# Patient Record
Sex: Female | Born: 1950 | Race: White | Hispanic: No | Smoking: Former smoker
Health system: Southern US, Community
[De-identification: ages and names within clinical notes are randomized; demographics above are authoritative.]

## PROBLEM LIST (undated history)

## (undated) DIAGNOSIS — R32 Unspecified urinary incontinence: Secondary | ICD-10-CM

## (undated) DIAGNOSIS — Z9889 Other specified postprocedural states: Secondary | ICD-10-CM

## (undated) DIAGNOSIS — M797 Fibromyalgia: Secondary | ICD-10-CM

## (undated) DIAGNOSIS — R112 Nausea with vomiting, unspecified: Secondary | ICD-10-CM

## (undated) DIAGNOSIS — B999 Unspecified infectious disease: Secondary | ICD-10-CM

## (undated) DIAGNOSIS — J45909 Unspecified asthma, uncomplicated: Secondary | ICD-10-CM

## (undated) DIAGNOSIS — I2510 Atherosclerotic heart disease of native coronary artery without angina pectoris: Secondary | ICD-10-CM

## (undated) DIAGNOSIS — M1990 Unspecified osteoarthritis, unspecified site: Secondary | ICD-10-CM

## (undated) DIAGNOSIS — R350 Frequency of micturition: Secondary | ICD-10-CM

## (undated) DIAGNOSIS — E119 Type 2 diabetes mellitus without complications: Secondary | ICD-10-CM

## (undated) DIAGNOSIS — I10 Essential (primary) hypertension: Secondary | ICD-10-CM

## (undated) DIAGNOSIS — K219 Gastro-esophageal reflux disease without esophagitis: Secondary | ICD-10-CM

## (undated) DIAGNOSIS — R0600 Dyspnea, unspecified: Secondary | ICD-10-CM

## (undated) DIAGNOSIS — E7800 Pure hypercholesterolemia, unspecified: Secondary | ICD-10-CM

## (undated) DIAGNOSIS — I739 Peripheral vascular disease, unspecified: Secondary | ICD-10-CM

## (undated) HISTORY — PX: KNEE SURGERY: SHX244

## (undated) HISTORY — PX: MULTIPLE TOOTH EXTRACTIONS: SHX2053

## (undated) HISTORY — PX: ABDOMINAL HYSTERECTOMY: SHX81

## (undated) HISTORY — PX: COLONOSCOPY: SHX174

## (undated) HISTORY — PX: CORONARY ARTERY BYPASS GRAFT: SHX141

---

## 1998-05-13 ENCOUNTER — Encounter: Payer: Self-pay | Admitting: Emergency Medicine

## 1998-05-13 ENCOUNTER — Inpatient Hospital Stay (HOSPITAL_COMMUNITY): Admission: EM | Admit: 1998-05-13 | Discharge: 1998-05-16 | Payer: Self-pay | Admitting: Emergency Medicine

## 1998-05-15 ENCOUNTER — Encounter: Payer: Self-pay | Admitting: Emergency Medicine

## 1998-08-19 ENCOUNTER — Other Ambulatory Visit: Admission: RE | Admit: 1998-08-19 | Discharge: 1998-08-19 | Payer: Self-pay | Admitting: Gynecology

## 1998-11-27 ENCOUNTER — Encounter: Admission: RE | Admit: 1998-11-27 | Discharge: 1999-02-25 | Payer: Self-pay | Admitting: Family Medicine

## 2001-10-27 ENCOUNTER — Emergency Department (HOSPITAL_COMMUNITY): Admission: EM | Admit: 2001-10-27 | Discharge: 2001-10-28 | Payer: Self-pay | Admitting: Emergency Medicine

## 2001-11-14 ENCOUNTER — Encounter: Payer: Self-pay | Admitting: Orthopedic Surgery

## 2001-11-21 ENCOUNTER — Encounter: Payer: Self-pay | Admitting: Orthopedic Surgery

## 2001-11-22 ENCOUNTER — Inpatient Hospital Stay (HOSPITAL_COMMUNITY): Admission: RE | Admit: 2001-11-22 | Discharge: 2001-11-25 | Payer: Self-pay | Admitting: Orthopedic Surgery

## 2001-11-22 ENCOUNTER — Encounter: Payer: Self-pay | Admitting: Orthopedic Surgery

## 2003-01-19 ENCOUNTER — Ambulatory Visit (HOSPITAL_COMMUNITY): Admission: RE | Admit: 2003-01-19 | Discharge: 2003-01-19 | Payer: Self-pay | Admitting: Internal Medicine

## 2003-06-26 ENCOUNTER — Ambulatory Visit (HOSPITAL_COMMUNITY): Admission: RE | Admit: 2003-06-26 | Discharge: 2003-06-26 | Payer: Self-pay | Admitting: Internal Medicine

## 2003-07-26 ENCOUNTER — Ambulatory Visit (HOSPITAL_COMMUNITY): Admission: RE | Admit: 2003-07-26 | Discharge: 2003-07-26 | Payer: Self-pay | Admitting: Family Medicine

## 2003-08-31 ENCOUNTER — Emergency Department (HOSPITAL_COMMUNITY): Admission: EM | Admit: 2003-08-31 | Discharge: 2003-09-01 | Payer: Self-pay | Admitting: Emergency Medicine

## 2003-10-15 ENCOUNTER — Ambulatory Visit: Payer: Self-pay | Admitting: Nurse Practitioner

## 2003-10-15 ENCOUNTER — Ambulatory Visit: Payer: Self-pay | Admitting: *Deleted

## 2003-10-18 ENCOUNTER — Ambulatory Visit: Payer: Self-pay | Admitting: Nurse Practitioner

## 2003-11-20 ENCOUNTER — Ambulatory Visit: Payer: Self-pay | Admitting: Nurse Practitioner

## 2004-01-15 ENCOUNTER — Ambulatory Visit: Payer: Self-pay | Admitting: Nurse Practitioner

## 2004-01-18 ENCOUNTER — Ambulatory Visit: Payer: Self-pay | Admitting: Nurse Practitioner

## 2004-03-13 ENCOUNTER — Ambulatory Visit: Payer: Self-pay | Admitting: Nurse Practitioner

## 2004-05-16 ENCOUNTER — Ambulatory Visit: Payer: Self-pay | Admitting: Family Medicine

## 2004-05-16 ENCOUNTER — Ambulatory Visit (HOSPITAL_COMMUNITY): Admission: RE | Admit: 2004-05-16 | Discharge: 2004-05-16 | Payer: Self-pay | Admitting: Family Medicine

## 2004-05-29 ENCOUNTER — Ambulatory Visit: Payer: Self-pay | Admitting: Nurse Practitioner

## 2004-05-29 ENCOUNTER — Inpatient Hospital Stay (HOSPITAL_COMMUNITY): Admission: EM | Admit: 2004-05-29 | Discharge: 2004-06-05 | Payer: Self-pay | Admitting: Emergency Medicine

## 2004-05-30 ENCOUNTER — Ambulatory Visit: Payer: Self-pay | Admitting: Cardiology

## 2004-06-26 ENCOUNTER — Ambulatory Visit: Payer: Self-pay | Admitting: Internal Medicine

## 2004-09-12 ENCOUNTER — Ambulatory Visit: Payer: Self-pay | Admitting: Cardiology

## 2004-10-03 ENCOUNTER — Ambulatory Visit: Payer: Self-pay | Admitting: *Deleted

## 2004-11-04 ENCOUNTER — Ambulatory Visit: Payer: Self-pay | Admitting: Nurse Practitioner

## 2005-01-13 ENCOUNTER — Ambulatory Visit: Payer: Self-pay | Admitting: Nurse Practitioner

## 2005-05-22 ENCOUNTER — Ambulatory Visit: Payer: Self-pay | Admitting: Internal Medicine

## 2005-06-25 ENCOUNTER — Ambulatory Visit: Payer: Self-pay | Admitting: *Deleted

## 2005-09-16 ENCOUNTER — Ambulatory Visit: Payer: Self-pay | Admitting: Nurse Practitioner

## 2005-09-18 ENCOUNTER — Ambulatory Visit (HOSPITAL_COMMUNITY): Admission: RE | Admit: 2005-09-18 | Discharge: 2005-09-18 | Payer: Self-pay | Admitting: Family Medicine

## 2005-09-25 ENCOUNTER — Ambulatory Visit: Payer: Self-pay | Admitting: Nurse Practitioner

## 2005-09-29 ENCOUNTER — Ambulatory Visit (HOSPITAL_COMMUNITY): Admission: RE | Admit: 2005-09-29 | Discharge: 2005-09-29 | Payer: Self-pay | Admitting: Family Medicine

## 2005-10-14 ENCOUNTER — Ambulatory Visit: Payer: Self-pay | Admitting: Nurse Practitioner

## 2006-07-21 ENCOUNTER — Ambulatory Visit: Payer: Self-pay | Admitting: Internal Medicine

## 2006-09-29 ENCOUNTER — Encounter (INDEPENDENT_AMBULATORY_CARE_PROVIDER_SITE_OTHER): Payer: Self-pay | Admitting: *Deleted

## 2006-10-18 DIAGNOSIS — M545 Low back pain, unspecified: Secondary | ICD-10-CM | POA: Insufficient documentation

## 2006-10-18 DIAGNOSIS — I10 Essential (primary) hypertension: Secondary | ICD-10-CM | POA: Insufficient documentation

## 2007-01-25 ENCOUNTER — Encounter (INDEPENDENT_AMBULATORY_CARE_PROVIDER_SITE_OTHER): Payer: Self-pay | Admitting: Nurse Practitioner

## 2007-01-25 ENCOUNTER — Ambulatory Visit: Payer: Self-pay | Admitting: Internal Medicine

## 2007-01-25 LAB — CONVERTED CEMR LAB
Cholesterol: 232 mg/dL — ABNORMAL HIGH (ref 0–200)
Total CHOL/HDL Ratio: 4.7

## 2007-10-25 ENCOUNTER — Ambulatory Visit: Payer: Self-pay | Admitting: Internal Medicine

## 2007-10-25 LAB — CONVERTED CEMR LAB
ALT: 20 units/L (ref 0–35)
Alkaline Phosphatase: 59 units/L (ref 39–117)
Amphetamine Screen, Ur: NEGATIVE
BUN: 14 mg/dL (ref 6–23)
Barbiturate Quant, Ur: NEGATIVE
Benzodiazepines.: NEGATIVE
Chloride: 104 meq/L (ref 96–112)
Cocaine Metabolites: NEGATIVE
Creatinine,U: 97.5 mg/dL
Methadone: NEGATIVE
Opiate Screen, Urine: NEGATIVE
Total Bilirubin: 0.4 mg/dL (ref 0.3–1.2)
Total Protein: 7.2 g/dL (ref 6.0–8.3)

## 2007-10-26 ENCOUNTER — Ambulatory Visit: Payer: Self-pay | Admitting: Internal Medicine

## 2008-02-27 ENCOUNTER — Ambulatory Visit: Payer: Self-pay | Admitting: Family Medicine

## 2008-03-01 ENCOUNTER — Ambulatory Visit (HOSPITAL_COMMUNITY): Admission: RE | Admit: 2008-03-01 | Discharge: 2008-03-01 | Payer: Self-pay | Admitting: Family Medicine

## 2008-03-01 ENCOUNTER — Ambulatory Visit: Payer: Self-pay | Admitting: Family Medicine

## 2010-05-30 NOTE — Consult Note (Signed)
NAME:  Andrea [name], Andrea [name] NO.:  [id number]   MEDICAL RECORD NO.:  [id number]          PATIENT TYPE:  INP   LOCATION:  1825                         FACILITY:  [facility]   PHYSICIAN:  [place] Bing, M.D.  DATE OF BIRTH:  18-Sep-1950   DATE OF CONSULTATION:  05/29/2004  DATE OF DISCHARGE:                                   CONSULTATION   REFERRING PHYSICIAN:  Carleene Cooper, M.D.   PRIMARY CARE PHYSICIAN:  Dr. Duke Salvia at [facility].   PRIMARY CARDIOLOGIST:  Rollene Rotunda, M.D.   HISTORY OF PRESENT ILLNESS:  A 60 year old woman with known coronary  disease, having undergone stent implantation for a circumflex stenosis in  approximately 2000, performed at [facility], now returns  with progressive angina and marked EKG abnormalities.  Andrea [name]  describes exertional chest pressure of moderate to marked severity occurring  at relatively low-level exercise over the past two weeks.  There is sluggish  a response to cessation of activity with discomfort lasting as much as 30  minutes.  She tried antacids for these symptoms without marked improvement.  She has had at least one episode at rest.  She was evaluated at [facility]  today, were an EKG was obtained and prompt to referral made to the emergency  department.  At the present time, the patient feels well.  The initial point  of care markers are negative.   The patient has multiple cardiovascular risk factors including diabetes,  hypertension, hyperlipidemia and continuing tobacco use.   PAST MEDICAL HISTORY:  Notable for COPD, fibromyalgia and osteoarthritis.  Prior surgeries have included right knee replacement and hysterectomy.   She reports an allergy or adverse reaction to NAPROXEN.   MEDICATIONS PRIOR TO ADMISSION:  Allopurinol, metformin, Celexa, Glucotrol,  Zocor, Arthrotec and methocarbamol.   SOCIAL HISTORY:  Lives in [place] by herself.  Works in [employer].  Approximately 40  pack-year history of cigarette smoking.  Denies excessive  use of alcohol.   FAMILY HISTORY:  Mother died at age 35 due to cerebral hemorrhage.  Family  history is otherwise sketchy, no prominent coronary disease.   REVIEW OF SYSTEMS:  Notable for occasional episodes of diaphoresis,  intermittent arthralgias and GERD symptoms.   PHYSICAL EXAMINATION:  GENERAL:  On exam now, a pleasant, overweight woman.  VITAL SIGNS:  The temperature is 99.7, heart rate 66 and regular,  respirations 20, blood pressure 155/82, O2 saturation 97% on 2 L.  HEENT:  Anicteric sclerae, normal lids and conjunctivae.  NECK:  No jugular venous distension, no carotid bruits.  ENDOCRINE:  No thyromegaly.  CARDIAC:  Normal first and second heart sounds; fourth heart sound present.  LUNGS:  Decreased breath sounds, mild inspiratory and expiratory  rhonchi.  ABDOMEN:  Soft and nontender, liver edge palpable below the right costal  margin.  EXTREMITIES:  Distal pulses intact.  No edema.  Right knee surgical scar.   Laboratory notable for normal renal function, normal CBC, glucose of 210 and  potassium of 4.5.  Coagulation studies are normal.   EKG:  Normal sinus rhythm; marked diffuse deep T-wave inversions  with QT  prolongation.   IMPRESSION:  Andrea [name] has progressive angina with marked EKG  abnormalities.  Based upon initial negative cardiac markers, she appears to  not have suffered a myocardial infarction, at least within the past few  days.  Coronary angiography is clearly warranted.  The risks and benefits  were discussed with the patient, who agrees to proceed.   Initial therapy will consist of aspirin and beta blocker.  If there is any  significant delay in catheterization, intravenous nitroglycerin and full  anticoagulation will be initiated.  She requires optimal control of  cardiovascular risk factors.  She was told in no uncertain terms that she  absolutely needs to stop smoking cigarettes.   Diabetic control should be  improved.  We will optimize therapy for her hypertension and hyperlipidemia.      RR/MEDQ  D:  05/29/2004  T:  05/29/2004  Job:  [id number]

## 2010-05-30 NOTE — Discharge Summary (Signed)
NAMEMarland Kitchen  BROOKLIN, RIEGER              ACCOUNT NO.:  [id number]   MEDICAL RECORD NO.:  [id number]          PATIENT TYPE:  INP   LOCATION:  2036                         FACILITY:  [facility]   PHYSICIAN:  Evelene Croon, M.D.     DATE OF BIRTH:  August 06, 1950   DATE OF ADMISSION:  05/29/2004  DATE OF DISCHARGE:  06/04/2004                                 DISCHARGE SUMMARY   ADMISSION DIAGNOSES:  Chest pain.   DISCHARGE AND SECONDARY DIAGNOSES:  1.  Severe three-vessel coronary artery disease with acute coronary syndrome      status post coronary artery bypass grafting.  2.  Probable postoperative urinary tract infection.  (Urine culture      consistent with contamination).  Status post a 3-day course of IV      cefepime.  3.  Tobacco abuse.  4.  Hypertension.  5.  Gastroesophageal reflux disease.  6.  Diabetes mellitus, type 2.  7.  Hyperlipidemia.  8.  Asthma.  9.  Problems with fibromyalgia.  10. Right knee osteoarthritis.  11. Coronary artery disease status post percutaneous transluminal cardiac      angioplasty and stent in 2000 at [facility].  12. Hysterectomy.  13. Depression.   ALLERGIES:  NAPROSYN which caused itching.   PROCEDURES:  1.  May 30, 2004, median sternotomy for coronary artery bypass grafting x 3      using the left internal mammary artery to the left anterior descending,      saphenous vein graft to the obtuse marginal branch of the left      circumflex coronary artery, saphenous vein graft to the posterior      descending coronary artery, endoscopic vein harvesting from the right      leg.  Surgeon: Evelene Croon, M.D.  2.  May 29, 2004, cardiac catheterization by Salvadore Farber, M.D. showing      diffuse two-vessel coronary artery disease with preserved left      ventricular systolic function.  3.  Pre-CABG Doppler on May 29, 2004, showing bilateral moderate      heterogeneous plaque throughout.  No internal carotid artery stenosis      bilaterally.   Vertebral artery antegrade.  There was left external      carotid artery stenosis.  ABIs were 1.0 on the right and 0.92 on the      left.   CONSULTATIONS:  1.  [facility], [place] Bing, M.D.  2.  Tobacco cessation.  3.  Cardiac rehab day #1.  4.  Diabetes management treatment team.   BRIEF HISTORY:  Ms. Gupta is a 60 year old Caucasian female with history  of ongoing tobacco abuse,  diabetes mellitus, hypertension, hyperlipidemia,  coronary artery disease status post PTCA and stenting, who presented to  [facility] Emergency Department with a two-week history of  substernal chest pain described as crushing pressure associated with  palpitations, shortness of breath, nausea, and radiation to her left arm and  neck.  Pain lasted approximately 30 minutes with some relief.  Pain worse  with exertion but also had a resting angina.  She  first attributed her chest  pain to panic attack and delayed seeking medical attention.  She saw Dr.  Emeline Darling at [facility] on May 29, 2004, and was instructed to come to  [facility] Emergency Department for further evaluation.  On admission, she  was initially evaluated by the teaching service who then contacted [facility]  [facility]iology for further management.  There were marked EKG changes but  negative cardiac enzymes.   HOSPITAL COURSE:  On May 29, 2004, Ms. Carducci was admitted to [facility] after presenting with chest pain in the emergency department.  She  was evaluated by Dr. Dietrich Pates from [facility] and scheduled for  cardiac catheterization which was performed later that day.  Post  catheterization, she was started on IV heparin.  Dr. Evelene Croon consulted  from [facility] regarding surgical revascularization due to the extent of her  coronary artery disease.  Dr. Laneta Simmers evaluated Ms. Sundberg and did agree  she would best benefit from coronary artery bypass grafting and scheduled  her for the following day.   In the meantime, she was continued on heparin  and other medications.  Pre-CABG Dopplers as discussed above.   On May 30, 2004, Ms. Adsit did undergo coronary artery bypass grafting.  Her postoperative course was relatively uneventful.  Immediately postop, she  was transferred to the surgical intensive care unit in stable condition.  By  later that evening, she had been extubated neurologically intact.  By  postoperative day #1, Swan catheter and chest tubes were discontinued.  She  did show signs of volume excess and was started on short-term diuretic  therapy.  Otherwise, she remained hemodynamically stable.  EKG showed sinus  rhythm.  She was started on Plavix as she presented with acute coronary  syndrome.  She was started on her home regimen of Glucotrol and Glucophage  for diabetes.   On postoperative day #2, she has already been transferred out of the  surgical intensive care unit, out to the floor.  Overnight, she did have a  temperature of maximum of 101.4.  This was felt most likely due to  atelectasis.  A pulmonary consultation was encouraged.  However, urine  culture was also ordered as a precaution.  Urinalysis did show large amount  of leukocyte with few bacteria.  Based on this, she was started on IV  cefepime.  However, urine culture results eventually showed findings  consistent with probable contamination.  Chest x-ray did show vascular  congestion, stable bibasilar atelectasis, small bilateral effusion.   On May 31, 2004, she made good progression.  She was able to tolerate diet.  Bowels functioned appropriately, though she required laxative for mild  constipation. Her pain was controlled on oral medication.  She was seen by  cardiac rehab for mobilization, and prior to discharge, was able to ambulate  independently.  Vital signs remained stable.  Most recently, blood pressure 128/79, heart rate in 90s, __________.  Oxygen saturation 93%.  She did  continue to  have a low-grade temperature around 100 and was continued on IV  antibiotics.  Her white count level was 5.5 which was trending down from her  previous lab results of 13,000.  She was diuresing well and returned to her  preoperative baseline.   By postoperative day #4, she was thought near ready for discharge home.  On  exam, her heart had a regular rate and rhythm.  Lung sounds were clear.  Her  incisions were clean and dry  without sign of infection.  Her abdominal exam  was benign, and her extremities showed mild lower extremity edema.  Blood  sugars were slightly elevated but better controlled with increased  Glucophage from 500 twice a day to 850 twice a day.  At the time of  dictation, they were ranging around 180 to 200.  Outpatient diabetes  education was arranged for her once discharged.   It was felt if there were no significant changes in Ms. Radle's status  over the next 24 hours and if she remained afebrile, she would be ready for  discharge home on postoperative day #5, Jun 04, 2004.   LABORATORY DATA:  Recent labs show a white blood count of 5.5, hemoglobin  9.4, hematocrit 27.2, platelet count 227.  Sodium 141, potassium 3.9,  chloride 105, CO2 27, blood glucose 153, BUN 8, creatinine 0.7, calcium 8.7.  Hemoglobin A1C 10.2.   DISCHARGE MEDICATIONS:  1.  Aspirin 81 mg p.o. daily.  2.  Lopressor 25 mg p.o. b.i.d.  3.  Altace 2.5 mg p.o. daily.  4.  Zocor 40 mg p.o. daily.  5.  Protonix 40 mg p.o. daily.  6.  Glucotrol XL 10 mg p.o. daily.  7.  Glucophage 850 mg p.o. b.i.d.  8.  Plavix 75 mg p.o. daily.  9.  Tylox 1 to 2 tablets p.o. q.4h. p.r.n. pain.   DISCHARGE INSTRUCTIONS:  1.  She was instructed to avoid driving or heavy lifting of more than 10      pounds.  2.  She was encouraged to continue daily walking and breathing exercises.  3.  She is to follow a low-fat, low-salt, carbohydrate-modified diet.  4.  She may shower and clean her incision gently with  mild soap and water.  5.  She is advised to call [facility] office if she develops fever greater than      101, redness or drainage at incision site.  6.  She is to follow up with Dr. Evelene Croon, [facility], July 01, 2004, 11:45      a.m.  7.  She is to call and schedule a two-week followup with Dr. Dietrich Pates,      [facility].  She was to have a chest x-ray at that appointment      and instructed to bring the chest x-ray with her to her appointment at      [facility]      office.  8.  She should follow up with Dr. Emeline Darling at [facility] within the month to      reevaluate management of diabetes mellitus.       AWZ/MEDQ  D:  06/03/2004  T:  06/03/2004  Job:  [id number]   cc:   Duke Salvia, M.D.,, [facility]   Evelene Croon, M.D.  [address]  [place]  [place] [address]  Fax: [phone]   [place]phre[place] Bing, M.D.

## 2010-05-30 NOTE — Discharge Summary (Signed)
NAME:  Andrea [name], Andrea [name]                          ACCOUNT NO.:  [id number]   MEDICAL RECORD NO.:  [id number]                   PATIENT TYPE:  INP   LOCATION:  0476                                 FACILITY:  [facility]   PHYSICIAN:  Marlowe Kays, M.D.               DATE OF BIRTH:  1951-01-09   DATE OF ADMISSION:  11/21/2001  DATE OF DISCHARGE:  11/25/2001                                 DISCHARGE SUMMARY   ADMISSION DIAGNOSES:  1. Osteoarthritis of right knee.  2. Hypertension.  3. Gastroesophageal reflux disease.  4. Diabetes mellitus.  5. Fibromyalgia.  6. Hypercholesterolemia.  7. Asthma.   DISCHARGE DIAGNOSES:  1. Osteoarthritis of right knee, status post right total knee arthroplasty.  2. Hypertension.  3. Gastroesophageal reflux disease.  4. Diabetes mellitus.  5. Fibromyalgia.  6. Hypercholesterolemia.  7. Asthma.   PROCEDURE:  The patient was taken to the operating room on November 21, 2001, and underwent medial compartment arthroplasty.  Surgeon Dr. Marlowe Kays, assistant Dr. Ranee Gosselin.  Surgery was done under general  anesthesia.  A Hemovac drain x1 was placed at the time of surgery.   CONSULTATIONS:  1. Physical therapy.  2. Occupational therapy.  3. Social work case Insurance account manager.   HISTORY OF PRESENT ILLNESS:  The patient is a 60 year old female with about  one-year history of right knee pain.  She has seen Dr. Simonne Come on multiple  occasions for antiinflammatory medications with steroid injections to the  right knee which all have failed.  She was seen by Dr. Simonne Come and felt it  was best that she undergo a medial compartment total knee arthroplasty.  The  risks and benefits of the surgery were discussed with the patient and the  patient wishes to proceed.   LABORATORY DATA AND X-RAY FINDINGS:  CBC on admission showed a hemoglobin of  11.7, hematocrit 34.9, white blood cell count 10.5, red blood cell count  3.94.  Serial H&Hs were followed  throughout the hospital stay.  Hemoglobin  and hematocrit did decline to 9.7 and 29.1 on November 23, 2001, however,  were stable at the time of discharge.  Differential on admission all within  normal limits.  Coagulation studies on admission all within normal limits.  Routine chemistry on admission showed glucose high at 152.  Urinalysis on  admission showed apparent hazing.  Follow up urinalysis on November 12,  showed apparent haziness with trace hemoglobin and many epithelials.   EKG revealed normal sinus rhythm and a normal EKG.  Preop chest x-ray  revealed probable COPD, chronic bronchitis with no evidence of acute  disease.  Postop x-rays on November 10, showed prosthesis identifying medial  compartment of right knee is not acute abnormality.  Follow up chest x-ray  on November 22, 2001, showed chronic bronchitic like changes with no  infiltrate, pulmonary vascular congestion with no frank pulmonary edema or  cardiomegaly with mild perivascular  edema.   HOSPITAL COURSE:  The patient was admitted to [facility] and taken  to the operating room.  She underwent the above-stated procedure without  complications.  The patient tolerated the procedure well and allowed to  recover on the orthopedic floor for continued postoperative care.  On postop  day #1, the patient was complaining of some pain in the right knee,  otherwise no major complaints.  Hemovac was discontinued on this date.  Bedside incentive spirometer was encouraged q.1h. while awake.  She was  weightbearing as tolerated.  T-max was 102.4.  On November 23, 2001,  orthopedic postop day #2, she was doing better from yesterday; however,  still having some pain.  The patient was continued on daily dressings.  The  patient was on pharmacy protocol.   On November 24, 2001, postop day #3, the patient was doing better.  She had  no major complaints.  Pain maximum was 101.  The patient was continuing to  work with PT and  OT; however, she was slow with progress.  On November 25, 2001, postop day #4, the patient was ready for discharge.   DISPOSITION:  The patient was discharged home on November 25, 2001.   DISCHARGE MEDICATIONS:  1. Vicodin #50 one to two p.o. q.4-6h. p.r.n. pain.  2. Aspirin 81 mg one p.o. q.d.  3. Robaxin 500 mg one p.o. q.6-8h. p.r.n. #40.   DIET:  As tolerated.   ACTIVITY:  Weightbearing as tolerated to right lower extremity.   FOLLOW UP:  The patient has a follow-up appointment with Dr. Simonne Come two  weeks from the date of surgery.  She is to call for an appointment.   CONDITION ON DISCHARGE:  Stable and improved.     Clarene Reamer, P.A.-C.                   Marlowe Kays, M.D.    SW/MEDQ  D:  12/12/2001  T:  12/12/2001  Job:  [id number]

## 2010-05-30 NOTE — Consult Note (Signed)
NAME:  Andrea [name], MUCHOW NO.:  [id number]   MEDICAL RECORD NO.:  [id number]          PATIENT TYPE:  INP   LOCATION:  6529                         FACILITY:  [facility]   PHYSICIAN:  Evelene Croon, M.D.     DATE OF BIRTH:  09/18/1950   DATE OF CONSULTATION:  05/29/2004  DATE OF DISCHARGE:                                   CONSULTATION   REFERRING PHYSICIAN:  Salvadore Farber, M.D.   REASON FOR CONSULTATION:  Severe three-vessel coronary disease with acute  coronary syndrome.   HISTORY OF PRESENT ILLNESS:  This patient is a 60 year old woman with  multiple cardiac risk factors including active smoking, hypertension,  diabetes, and hyperlipidemia who has a history of coronary disease status  post stenting of the left circumflex coronary artery in the past. She had  presented with a two week history of episodic substernal chest pressure that  she rated as a 10/10 lasting about 30 minutes and resolving spontaneously  after taking some antacids. She thought this was heartburn. One episode  occurred while sleeping and awoke her. She saw her primary M.D. today. An  electrocardiogram was markedly abnormal and she was sent to the emergency  room painfree.   She underwent cardiac catheterization this afternoon by Dr. Samule Ohm and this  showed severe three-vessel disease. The LAD 90% proximal stenosis at the  takeoff of the diagonal branch. The left circumflex stent was widely patent.  There is about 50% stenosis of a large bifurcating marginal branch. The  right coronary artery had about 70-80% hazy midvessel stenosis and 60%  distal stenosis. The left ventricular ejection fraction was about 65%  without wall motion abnormalities. There is no aortic stenosis or mitral  regurgitation.   PAST MEDICAL HISTORY:  Significant for cardiac disease as mentioned above.  She has history of diabetes, hypertension, and hyperlipidemia. She has  history of COPD. She has a history of  fibromyalgia. She has history of  osteoarthritis. She has a history of asthma. She has a history of  gastroesophageal reflux.   PAST SURGICAL HISTORY:  She is status post hysterectomy and status post  right partial knee replacement.   REVIEW OF SYSTEMS:  GENERAL:  She denies any fever or chills. She has had  some diaphoresis. EYES:  Negative. ENT:  Negative. ENDOCRINE:  She does have  diabetes. She denies hypothyroidism. CARDIOVASCULAR:  As above. She denies  PND or orthopnea. She has had exertional chest pressure and one episode at  rest as well as exertional dyspnea. She denies palpitations. She has had  mild peripheral edema at the end of the day. RESPIRATORY:  She denies cough  or sputum production. GI:  She denies nausea or vomiting. She has had no  melena or bright red blood per rectum. She has had some heartburn type pain.  GU:  She denies dysuria and hematuria. NEUROLOGICAL:  She denies any focal  weakness or numbness. She denies dizziness and syncope.   ALLERGIES:  NAPROXEN.   SOCIAL HISTORY:  She lives in [place] with her husband. She works at the  Pension scheme manager at [employer].  She has smoked for one pack of cigarettes per  day for greater than 20 years. She denies alcohol and drug abuse.   FAMILY HISTORY:  Her mother died at age 79 with a stroke and she does not  know what her father died of. She has no other family history of coronary  disease.   PHYSICAL EXAMINATION:  VITAL SIGNS:  Her blood pressure is 140/75. Pulse is  70 and regular. Respiratory rate 16 and unlabored.  GENERAL:  She is a well-developed white female in no distress.  HEENT:  Shows be normocephalic and atraumatic. Pupils are equal, reactive to  light and accommodation. Extraocular muscles are intact. Throat is clear.  Teeth in good condition.  NECK:  Neck exam shows normal carotid pulses bilaterally. There are no  bruits. There is no adenopathy or thyromegaly.  CARDIOVASCULAR:  Cardiac exam shows a  regular rate and rhythm with normal S1-  S2. There is no murmur or gallop.  LUNGS:  Clear.  ABDOMEN:  Abdominal exam shows active bowel sounds. Abdomen is soft and  nontender. No palpable mass or organomegaly.  EXTREMITIES:  Extremity exam shows no peripheral edema. Pedal pulses are  palpable bilaterally. There is an old right knee scar.  NEUROLOGICAL:  Neurologic exam shows to be alert and oriented x3. Motor and  sensory exams grossly normal.   IMPRESSION:  This patient has severe three-vessel coronary disease  presenting with acute coronary syndrome and negative enzymes. She had  markedly abnormal EKG on presentation. I agree that coronary bypass graft  surgery is the best treatment to prevent further ischemia and infarction. I  discussed the operative procedure with the patient and her husband including  alternatives, benefits, risks including bleeding, blood transfusion,  infection, stroke, myocardial infarction, graft failure, and death. They  understand and would like to proceed with surgery. Also discussed the  possible use of left radial artery graft depending on the results of her  peripheral Dopplers which are to be done tonight.       BB/MEDQ  D:  05/29/2004  T:  05/29/2004  Job:  [id number]

## 2010-05-30 NOTE — H&P (Addendum)
NAME:  Andrea [name], Andrea [name] NO.:  [id number]   MEDICAL RECORD NO.:  [id number]                   PATIENT TYPE:  OBV   LOCATION:  0476                                 FACILITY:  [facility]   PHYSICIAN:  Marlowe Kays, M.D.               DATE OF BIRTH:  01/04/1951   DATE OF ADMISSION:  11/21/2001  DATE OF DISCHARGE:                                HISTORY & PHYSICAL   CHIEF COMPLAINT:  Right knee pain.   HISTORY OF PRESENT ILLNESS:  A 60 year old female with about a year history  of right knee pain.  She has seen Dr. Simonne Come on multiple occasions and  has tried anti-inflammatory medications also along with steroid and Synvisc  injections to her right knee which have all failed.  She and Dr. Simonne Come  feel it is best to undergo an unicompartmental total knee arthroplasty.  Risks and benefits of this surgery have been discussed with the patient and  the patient wishes to proceed.   PAST MEDICAL HISTORY:  1. Hypertension.  2. Gastroesophageal reflux disease.  3. Diabetes mellitus.  4. Fibromyalgia.  5. Osteoarthritis.  6. Allergies.  7. Hypercholesterolemia.  8. Asthma.   PAST SURGICAL HISTORY:  1. Cardiac catheterization.  2. Hysterectomy.   MEDICATIONS:  1. Paxil 40 mg one and a half tablets p.o. q.a.m.  2. Accupril 20 mg one p.o. q.a.m.  3. Aciphex 20 mg one p.o. b.i.d.  4. Celebrex 200 mg one p.o. b.i.d.  5. Zocor 40 mg one and a half tablets p.o. q.a.m.  6. Metformin 850 mg two q.a.m. and one q.h.s.  7. Allegra 180 mg one p.o. q.d.  8. Estropipate 1.5 mg one p.o. q.a.m.  9. Actos 30 mg one p.o. q.a.m.  10.      Flovent inhaler two puffs b.i.d.  11.      Albuterol inhaler two puffs q.i.d.  12.      Ambien 10 mg one-half to one tablet p.o. q.h.s. p.r.n.  13.      Triamterene/HCTZ 37.5/25 mg one p.o. q.d. p.r.n. swelling in feet,     hands, and legs.  14.      Vicodin one p.o. q.h.s.   SOCIAL HISTORY:  The patient denies any alcohol use.   She is a one pack per  day smoker x20 years.  She is married.  She lives in a one-story house with  two steps into the house and her husband will be her care giver after  surgery.   FAMILY HISTORY:  Mother deceased of stroke at age 59.  Father history not  known.   REVIEW OF SYSTEMS:  GENERAL:  Positive night sweats.  Denies fevers, chills,  bleeding tendencies.  CNS:  Positive headaches one per week.  Denies  blurry/double vision, seizures, paralysis.  RESPIRATORY:  Denies subjective,  productive cough, hemoptysis.  CARDIOVASCULAR:  Denies chest pain, angina,  orthopnea.  GASTROINTESTINAL:  Positive constipation.  Denies nausea,  vomiting, diarrhea, melena, or bloody stools.  GENITOURINARY:  Denies  dysuria, hematuria, discharge.  MUSCULOSKELETAL:  Pertinent to HPI.   PHYSICAL EXAMINATION:  VITAL SIGNS:  Blood pressure 147/67, pulse 86,  respirations 18, temperature 101.  GENERAL:  Well-developed, well-nourished 60 year old female.  NECK:  Supple.  No carotid bruit noted.  CHEST:  Clear to auscultation bilaterally.  No masses or lesions.  HEART:  Regular rate and rhythm.  No murmurs, rubs, or gallops.  ABDOMEN:  Soft, nontender, nondistended.  Positive bowel sounds x4.  EXTREMITIES:  Painful range of motion of the right knee.  Has positive  swelling.  She walks with antalgic gait.  SKIN:  No rashes or lesions.  NEUROLOGIC:  Alert and oriented x3.   LABORATORIES:  X-ray reveals bone on bone in the medial compartment in the  right knee.   IMPRESSION:  1. Osteoarthritis right knee.  2. Hypertension.  3. Gastroesophageal reflux disease.  4. Diabetes mellitus.  5. Fibromyalgia.  6. Hypercholesterolemia.  7. Asthma.   PLAN:  The patient will be admitted to [facility] on November 21, 2001 and undergo a right medial unicompartmental total knee arthroplasty by  Dr. Marlowe Kays.   ____ QA MARKER: 255 ____           Clarene Reamer, P.A.-C.                    Marlowe Kays, M.D.    SW/MEDQ  D:  11/23/2001  T:  11/23/2001  Job:  [id number]

## 2010-05-30 NOTE — Op Note (Signed)
NAMEMarland [name]  MOSELLA, KASA NO.:  [id number]   MEDICAL RECORD NO.:  [id number]          PATIENT TYPE:  INP   LOCATION:  2311                         FACILITY:  [facility]   PHYSICIAN:  Evelene Croon, M.D.     DATE OF BIRTH:  03-Feb-1950   DATE OF PROCEDURE:  05/30/2004  DATE OF DISCHARGE:                                 OPERATIVE REPORT   PREOPERATIVE DIAGNOSIS:  Severe three vessel coronary artery disease with  acute coronary syndrome.   POSTOPERATIVE DIAGNOSIS:  Severe three vessel coronary artery disease with  acute coronary syndrome.   OPERATIVE PROCEDURE:  Median sternotomy, extracorporeal circulation,  coronary bypass graft surgery x 3 using left internal mammary artery graft  to the left anterior descending coronary artery, saphenous vein graft to the  obtuse marginal branch of the left circumflex coronary artery, and a  saphenous vein graft to the posterior descending coronary artery, endoscopic  vein harvesting from the right leg.   SURGEON:  Evelene Croon, M.D.   ASSISTANT:  Kerin Perna, M.D.   SECOND ASSISTANT:  Stephanie Acre Dominick, P.A.-C.   ANESTHESIA:  General endotracheal anesthesia.   CLINICAL HISTORY:  This patient is a 60 year old woman with a history of  coronary disease status post stenting of the left circumflex in the past who  also has a history of diabetes, hypertension, hyperlipidemia, and active  smoking.  She presented with an acute coronary syndrome with marked EKG  changes and negative enzymes.  She underwent cardiac catheterization which  showed severe three vessel coronary artery disease.  The LAD had a 90%  stenosis just after the take off of the first diagonal branch which was a  relatively small vessel.  The left circumflex had a proximal stent which was  patent.  There was about 50% stenosis in an obtuse marginal branch.  The  right coronary artery had about 80% mid vessel stenosis which was hazy and  about 60% distal stenosis  before the take off of the posterior descending  branch.  The distal vessel is relatively small.  Left ventricular ejection  fraction was 65% without wall motion abnormalities.  There was no aortic  stenosis or mitral regurgitation.  After review of the angiogram and  examination of the patient, it was felt that coronary artery bypass graft  surgery would be the best treatment.  I discussed the operative procedure  with the patient and her husband including alternatives, benefits, and  risks, including bleeding, blood transfusion, infection, stroke, myocardial  infarction, and death.  I also discussed the importance of maximum cardiac  risk factor reduction including complete smoking cessation.  She understood  and agreed to proceed.   OPERATIVE PROCEDURE:  The patient was taken to the operating room and placed  on the table in supine position.  After induction of general endotracheal  anesthesia, a Foley catheter was placed in the bladder using sterile  technique.  The chest, abdomen, and both lower extremities were prepped and  draped in the usual sterile manner.  The chest was entered through a median  sternotomy incision and the pericardium opened  in the midline.  Examination  of the heart showed good ventricular contractility.  The ascending aorta had  no palpable plaques in it.  Then, the left internal mammary artery was  harvested from the chest wall as a pedicle graft.  This was a medium caliber  vessel with excellent blood flow through it.  At the same time, a segment of  greater saphenous vein was harvested from the right thigh endoscopically.  This vein was of medium size and good quality.  The patient was heparinized  and when an adequate activated clotting time was achieved, the distal  ascending aorta was cannulated using a 20 French aortic cannula for arterial  in flow.  Venous outflow was achieved using two staged venous cannula  through the right atrial appendage.  An  antegrade cardioplegia and vent  cannula was inserted in the aortic root.   The patient was placed on cardiopulmonary bypass and the distal coronaries  were identified.  The LAD was diffusely diseased.  The diagonal branch was  small and diffusely diseased and not suitable for grafting.  The obtuse  marginal was also diffusely diseased but graftable distally.  The right  coronary artery was diffusely and severely diseased and this extended out to  the take off of the posterior descending branch.  The posterior descending  branch, itself, had posterior plaque throughout but was a small graftable  vessel.  Then, the aorta was crossclamped and 500 mL of cold blood antegrade  cardioplegia was administered in the aortic root with quick arrest of the  heart.  Systemic hypothermia at 28 degrees Centigrade and topical  hypothermia with iced saline was used.  A temperature probe was placed in  the septum and insulated pad in the pericardium.   The first distal anastomosis was performed to the posterior descending  coronary artery.  The internal diameter of this vessel was about 1.5 mm.  The conduit used was a segment of greater saphenous vein.  The anastomosis  was performed in an end-to-side manner using a continuous 8-0 Prolene  suture.  Flow was measured through the graft and was good.  A dose of  cardioplegia was given down this vein graft.  The second distal anastomosis  was performed to the obtuse marginal branch.  The internal diameter of this  vessel was about 1.6 mm.  The conduit used was a second segment of greater  saphenous vein.  The anastomosis was performed in an end-to-side fashion  using a continuous 7-0 Prolene suture.  Flow was measured through the graft  and was excellent.  The third distal anastomosis was performed to the distal  portion of the left anterior descending  coronary artery.  The internal diameter of this vessel was about 1.6 mm.  The conduit used was the left   internal mammary graft and this was brought through an opening in the left  pericardium anterior to the phrenic nerve.  It was anastomosed to the LAD in  an end-to-side manner using a continuous 8-0 Prolene suture.  The pedicle  was tacked to the epicardium with 6-0 Prolene sutures.   The patient was rewarmed to 37 degrees Centigrade.  With the Cross clamp in  place, the two proximal vein graft anastomoses were performed in the aortic  root in an end-to-side manner using continuous 6-0 Prolene suture.  Then,  the clamp was removed from the mammary pedicle.  There was rapid warming of  the ventricular septum and return of spontaneous ventricular fibrillation.  The Cross clamp was removed with a time of 75 minutes and the patient  spontaneously converted to sinus rhythm.  The proximal and distal  anastomoses appeared hemostatic and the lie of the grafts were satisfactory.  Graft markers were placed around the proximal anastomoses.  Two temporary  right ventricular and right atrial pacing wires were placed and brought  through the skin.   When the patient had been rewarmed to 37 degrees Centigrade, she was weaned  from cardiopulmonary bypass on no inotropic agents.  Total bypass time 95  minutes.  Cardiac function appeared excellent with a cardiac output of 4  liters per minute.  Protamine was given and the venous and aortic cannulae  were removed without difficulty.  Hemostasis was achieved.  Three chest  tubes were placed, two in the posterior pericardium, one in the left pleural  space, and one in the anterior mediastinum.  The pericardium was loosely  reapproximated over the heart.  The sternum was closed with #6 stainless  steel wires.  The fascia was closed with continuous #1 Vicryl suture.  The  subcutaneous tissue was closed with continuous 2-0 Vicryl and the skin with  3-0 Vicryl subcuticular closure.  The lower extremity vein harvest site was  closed in layers in a similar manner.   The sponge, needle, and instrument  counts were correct according to the scrub nurse.  Dry, sterile dressings  were applied to the incision and around the chest tubes which were hooked to  Pleur-evac suction.  The patient remained hemodynamically stable and was  transferred to the SICU in guarded but stable condition.       BB/MEDQ  D:  05/30/2004  T:  05/30/2004  Job:  [id number]

## 2010-05-30 NOTE — Op Note (Signed)
NAME:  Andrea [name], Andrea [name]                          ACCOUNT NO.:  [id number]   MEDICAL RECORD NO.:  [id number]                   PATIENT TYPE:  AMB   LOCATION:  DAY                                  [facility]ACILITY:  [facility]   PHYSICIAN:  Marlowe Kays, M.D.               DATE OF BIRTH:  [birth year]   DATE OF PROCEDURE:  11/21/2001  DATE OF DISCHARGE:                                 OPERATIVE REPORT   PREOPERATIVE DIAGNOSIS:  Medial compartment arthritis, right knee.   POSTOPERATIVE DIAGNOSIS:  Medial compartment arthritis, right knee.   OPERATION:  EIUS medial compartment arthroplasty, right knee.   SURGEON:  Illene Labrador. [place], M.D.   ASSISTANT:  Georges Lynch. Darrelyn Hillock, M.D.   ANESTHESIA:  General.   PATHOLOGY AND JUSTIFICATION FOR PROCEDURE:  On plain films, she had almost  bone-on-bone abutment medially.  MRI has demonstrated arthritic changes  mainly in the medial knee.  She has failed to respond to nonsurgical  treatment and consequently is here today for the above-mentioned surgery.   PROCEDURE:  Prophylactic antibiotics, satisfactory general anesthesia, Foley  catheter inserted, pneumatic tourniquet applied with the thigh stabilizer  over the tourniquet.  Right leg was prepped with DuraPrep from tourniquet to  ankle and draped in a sterile field.  Ioban employed.  With the knee flexed,  I made a median parapatellar incision roughly superiorly below the patella,  down to the tibial tubercle.  Incision was carried down through the joint.  The anterior fat pad was resected as well as the anterior portion of the  medial meniscus.  Her ACL was intact.  She had full-thickness wear of the  medial femoral condyle and a good bit of fluid in the joint.  Medial facet  of her patella had some arthritic changes and was resected with power saw  which gave also better visualization.  She did have a slight patellofemoral  wear.  First, placed external jig around the ankle and with the stylus on  the  low point of the medial tibial plateau, stabilizing the jug at this  point and then placing the adapter to make the sagittal cut adjacent to the  ACL.  After making this cut, I then placed 8 mm block on this guide with a  transverse cut through the medial tibial plateau, removing this chunk of  bone and additional medial meniscus.  Then used the tension positioner to be  sure that we had removed adequate bone from the medial femoral condyle, and  there was adequate bone removed for an 8 mm spacer but little tight for a 9  mm spacer.  Consequently we went to the femur where I placed the femoral  template guide on the femur and using the external rod, aligned it with the  previously determined position of the femoral artery which had a roll of  tape over it.  This was also supplemented by visually looking at  the femur  to be sure that the femoral trial template was ideally positioned.  Then  stabilized it in this position with two pins and using the small drill, made  a stabilizing hole for use later and then the centering drill hole for use  later as well and finally used the stop bur to make the perimeter outline  for the final femoral component.  This trial femoral component was then  removed, and the centering portion of bone and articular cartilage was then  removed with rongeur and bur and trial femoral component was placed after  first cutting the posterior femoral condyle using the guide.  We then went  through a trial reduction after sizing the tibia at a small.  Medium had a  little overhang.  She had excellent flexion and extension position of the  components.  Based on this, I then went ahead and re-used the apparatus for  the tibia to impact for the tibial keel.  Before gluing any components in,  we placed a final tibial component, small size 8, which fit nicely onto the  tibia.  Accordingly, we then went ahead and thoroughly irrigated the knee  while the methyl methacrylate was  being hardened.  We also infiltrated the  posterior joint with Marcaine 0.5% with adrenalin and placed two sponges,  one posteriorly and one up in the suprapatellar pouch for removal after we  had methyl methacrylated in the components to try and minimize any glue  going posteriorly.  The tibial component was placed, impacted, followed by a  femoral component which was placed and impacted, and the knee was then held,  first at 90 degrees and at 45 degrees with the tunnel blade between the  components for tightness.  Excess methyl methacrylate was trimmed up.  After  the glue hardened, we then inspected the components.  There was no excess  methyl methacrylate remaining, and she had excellent flexion and extension  of the knee with good stability and excellent motion.  A Hemovac was placed.  The wound was then closed with interrupted #1 Vicryl in two layers in the  medial synovium and capsule, followed by 2-0 Vicryl in the subcutaneous  tissue and Steri-Strips on the skin.  Dry sterile dressing, knee immobilizer  were applied.  She tolerated the procedure well and was taken to the  recovery room in satisfactory condition with no known complications and less  than two hours of tourniquet time.                                               Marlowe Kays, M.D.    JA/MEDQ  D:  11/21/2001  T:  11/21/2001  Job:  [id number]

## 2010-05-30 NOTE — Cardiovascular Report (Signed)
NAME:  Andrea [name], Andrea [name] NO.:  [id number]   MEDICAL RECORD NO.:  [id number]          PATIENT TYPE:  INP   LOCATION:  6529                         FACILITY:  [facility]   PHYSICIAN:  Salvadore Farber, M.D. LHCDATE OF BIRTH:  07/10/1950   DATE OF PROCEDURE:  05/29/2004  DATE OF DISCHARGE:                              CARDIAC CATHETERIZATION   PROCEDURE:  Left heart catheterization, left ventriculography, coronary  angiography, Starclose closure of the right common femoral arteriotomy site.   INDICATIONS FOR PROCEDURE:  Ms. Sirico is a 60 year old lady status post  percutaneous intervention on the circumflex in 2000.  She now presents with  unstable angina with T-wave inversions and ST depressions, both inferiorly  and anteriorly.  She was referred for urgent diagnostic angiography.   PROCEDURE TECHNIQUE:  Informed consent was obtained.  Under 1% lidocaine  local anesthesia, a 6 French sheath was placed in the right femoral artery  using the modified Seldinger technique.  Diagnostic angiography and  ventriculography were then performed using JL4, JR4, and pigtail catheters.  The arteriotomy site was then closed using a 6 [place] Starclose device.  Complete hemostasis was obtained.  She was then transferred to the holding  room in stable condition.   COMPLICATIONS:  None.   FINDINGS:  1.  Left ventricle:  142/9/15.  EF 65% without regional wall motion      abnormality.  2.  No aortic stenosis.  3.  Left main:  There is no left main.  There is separate ostia to the LAD      and circumflex.  4.  LAD:  Large vessel wrapping the apex of the heart and giving rise to a      single moderate size diagonal branch.  The LAD is diffusely diseased      along its length.  There is a focal 90% stenosis just after the take off      of the first diagonal.  There was then a 50% stenosis of the distal      vessel.  5.  Circumflex:  Moderate size vessel giving rise to a single large  obtuse      marginal.  The stent in the proximal vessel is widely patent with no      evidence of in stent restenosis.  There is a 50% stenosis of the distal      obtuse marginal.  6.  Right coronary artery:  Moderate size dominant vessel.  The vessel was      diffusely diseased along its length.  There is a long 80% stenosis      throughout the mid vessel and at least 60% stenosis distally.   IMPRESSION/RECOMMENDATIONS:  The patient has diffuse two vessel coronary  disease in the setting of diabetes mellitus and preserved left ventricular  systolic function.  Given the diffuse nature of her disease, I think she  would be better served with coronary artery bypass grafting and percutaneous  intervention.  Films were reviewed with Dr. Dorris Fetch who concurs.      WED/MEDQ  D:  05/29/2004  T:  05/29/2004  Job:  [id number]   cc:   Lincolnia Bing, M.D.

## 2012-05-05 ENCOUNTER — Emergency Department (HOSPITAL_BASED_OUTPATIENT_CLINIC_OR_DEPARTMENT_OTHER): Payer: Worker's Compensation

## 2012-05-05 ENCOUNTER — Encounter (HOSPITAL_BASED_OUTPATIENT_CLINIC_OR_DEPARTMENT_OTHER): Payer: Self-pay | Admitting: *Deleted

## 2012-05-05 ENCOUNTER — Emergency Department (HOSPITAL_BASED_OUTPATIENT_CLINIC_OR_DEPARTMENT_OTHER)
Admission: EM | Admit: 2012-05-05 | Discharge: 2012-05-05 | Disposition: A | Discharge status: Home / Self Care | Payer: Worker's Compensation | Attending: Emergency Medicine | Admitting: Emergency Medicine

## 2012-05-05 DIAGNOSIS — M25551 Pain in right hip: Secondary | ICD-10-CM

## 2012-05-05 DIAGNOSIS — R296 Repeated falls: Secondary | ICD-10-CM | POA: Insufficient documentation

## 2012-05-05 DIAGNOSIS — M533 Sacrococcygeal disorders, not elsewhere classified: Secondary | ICD-10-CM

## 2012-05-05 DIAGNOSIS — W19XXXA Unspecified fall, initial encounter: Secondary | ICD-10-CM

## 2012-05-05 DIAGNOSIS — M25461 Effusion, right knee: Secondary | ICD-10-CM

## 2012-05-05 DIAGNOSIS — Z951 Presence of aortocoronary bypass graft: Secondary | ICD-10-CM | POA: Insufficient documentation

## 2012-05-05 DIAGNOSIS — Y9301 Activity, walking, marching and hiking: Secondary | ICD-10-CM | POA: Insufficient documentation

## 2012-05-05 DIAGNOSIS — IMO0002 Reserved for concepts with insufficient information to code with codable children: Secondary | ICD-10-CM | POA: Insufficient documentation

## 2012-05-05 DIAGNOSIS — Z9889 Other specified postprocedural states: Secondary | ICD-10-CM | POA: Insufficient documentation

## 2012-05-05 DIAGNOSIS — S79929A Unspecified injury of unspecified thigh, initial encounter: Secondary | ICD-10-CM | POA: Insufficient documentation

## 2012-05-05 DIAGNOSIS — Y990 Civilian activity done for income or pay: Secondary | ICD-10-CM | POA: Insufficient documentation

## 2012-05-05 DIAGNOSIS — M25571 Pain in right ankle and joints of right foot: Secondary | ICD-10-CM

## 2012-05-05 DIAGNOSIS — S8990XA Unspecified injury of unspecified lower leg, initial encounter: Secondary | ICD-10-CM | POA: Insufficient documentation

## 2012-05-05 DIAGNOSIS — S79919A Unspecified injury of unspecified hip, initial encounter: Secondary | ICD-10-CM | POA: Insufficient documentation

## 2012-05-05 DIAGNOSIS — IMO0001 Reserved for inherently not codable concepts without codable children: Secondary | ICD-10-CM | POA: Insufficient documentation

## 2012-05-05 DIAGNOSIS — M25469 Effusion, unspecified knee: Secondary | ICD-10-CM | POA: Insufficient documentation

## 2012-05-05 DIAGNOSIS — S99929A Unspecified injury of unspecified foot, initial encounter: Secondary | ICD-10-CM | POA: Insufficient documentation

## 2012-05-05 DIAGNOSIS — Z794 Long term (current) use of insulin: Secondary | ICD-10-CM | POA: Insufficient documentation

## 2012-05-05 DIAGNOSIS — S46909A Unspecified injury of unspecified muscle, fascia and tendon at shoulder and upper arm level, unspecified arm, initial encounter: Secondary | ICD-10-CM | POA: Insufficient documentation

## 2012-05-05 DIAGNOSIS — I2510 Atherosclerotic heart disease of native coronary artery without angina pectoris: Secondary | ICD-10-CM | POA: Insufficient documentation

## 2012-05-05 DIAGNOSIS — E119 Type 2 diabetes mellitus without complications: Secondary | ICD-10-CM | POA: Insufficient documentation

## 2012-05-05 DIAGNOSIS — Y9289 Other specified places as the place of occurrence of the external cause: Secondary | ICD-10-CM | POA: Insufficient documentation

## 2012-05-05 DIAGNOSIS — S4980XA Other specified injuries of shoulder and upper arm, unspecified arm, initial encounter: Secondary | ICD-10-CM | POA: Insufficient documentation

## 2012-05-05 DIAGNOSIS — F17200 Nicotine dependence, unspecified, uncomplicated: Secondary | ICD-10-CM | POA: Insufficient documentation

## 2012-05-05 HISTORY — DX: Type 2 diabetes mellitus without complications: E11.9

## 2012-05-05 HISTORY — DX: Atherosclerotic heart disease of native coronary artery without angina pectoris: I25.10

## 2012-05-05 HISTORY — DX: Fibromyalgia: M79.7

## 2012-05-05 MED ORDER — OXYCODONE-ACETAMINOPHEN 5-325 MG PO TABS: 1.0000 | ORAL_TABLET | ORAL | Status: DC | PRN

## 2012-05-05 MED ORDER — OXYCODONE-ACETAMINOPHEN 5-325 MG PO TABS
2.0000 | ORAL_TABLET | Freq: Once | ORAL | Status: AC
  Administered 2012-05-05: 2 via ORAL
  Filled 2012-05-05 (×2): qty 2

## 2012-05-05 NOTE — ED Provider Notes (Signed)
History     CSN: [id number]  Arrival date & time 05/05/12  1137   First MD Initiated Contact with Patient 05/05/12 1210      Chief Complaint  Patient presents with  . Fall    (Consider location/radiation/quality/duration/timing/severity/associated sxs/prior treatment) HPI Comments: Pt states that she fell on some liquid at work and landed on her buttock:pt states that she is hurting on her right knee, ankle hip and shoulder:pt has not taken anything for pain  Patient is a 62 y.o. female presenting with fall. The history is provided by the patient. No language interpreter was used.  Fall The accident occurred less than 1 hour ago. The fall occurred while walking. She landed on a hard floor. The pain is moderate. There was no entrapment after the fall. There was no drug use involved in the accident. There was no alcohol use involved in the accident.    Past Medical History  Diagnosis Date  . Coronary artery disease   . Fibromyalgia   . Diabetes mellitus without complication     Past Surgical History  Procedure Laterality Date  . Knee surgery    . Coronary artery bypass graft      No family history on file.  History  Substance Use Topics  . Smoking status: Current Every Day Smoker -- 0.50 packs/day    Types: Cigarettes  . Smokeless tobacco: Not on file  . Alcohol Use: No    OB History   Grav Para Term Preterm Abortions TAB SAB Ect Mult Living                  Review of Systems  Constitutional: Negative.   Respiratory: Negative.   Cardiovascular: Negative.     Allergies  Naproxen and Tramadol hcl  Home Medications   Current Outpatient Rx  Name  Route  Sig  Dispense  Refill  . IBUPROFEN PO   Oral   Take by mouth.         . insulin lispro (HUMALOG) 100 UNIT/ML injection   Subcutaneous   Inject into the skin 3 (three) times daily before meals.           BP 158/112  Pulse 60  Temp(Src) 98.9 F (37.2 C) (Oral)  Resp 16  Ht 5\' 2"  (1.575 m)  Wt  130 lb (58.968 kg)  BMI 23.77 kg/m2  SpO2 95%  Physical Exam  Nursing note and vitals reviewed. Constitutional: She is oriented to person, place, and time. She appears well-developed and well-nourished.  HENT:  Head: Atraumatic.  Eyes: Conjunctivae and EOM are normal. Pupils are equal, round, and reactive to light.  Neck: Normal range of motion. Neck supple.  Cardiovascular: Normal rate and regular rhythm.   Pulmonary/Chest: Effort normal and breath sounds normal.  Abdominal: Soft. Bowel sounds are normal. There is no tenderness.  Musculoskeletal:       Cervical back: Normal.       Thoracic back: Normal.       Lumbar back: Normal.  Pt tender on the right lateral ankle:no gross deformity or swelling noted:pt tender and swollen on the right knee:pt has full rom:pt tender on the lateral right shoulder:coccyx tender  Neurological: She is alert and oriented to person, place, and time.  Skin: Skin is warm and dry.  Psychiatric: She has a normal mood and affect.    ED Course  Procedures (including critical care time)  Labs Reviewed - No data to display Dg Sacrum/coccyx  05/05/2012  *RADIOLOGY REPORT*  Clinical Data: Fall.  Sacral pain.  Sacral fracture.  SACRUM AND COCCYX - 2+ VIEW  Comparison: None.  Findings: Umbilical adornment projects over the upper sacrum on the frontal view.  There is no displaced sacral fracture.  Sacral arcades appear within normal limits.  Visualized hips and obturator rings appear normal.  Bilateral SI joint degenerative disease is present, greater on the right than left.  IMPRESSION: No acute abnormality.  Degenerative changes of the SI joints bilaterally.   Original Report Authenticated By: Andreas Newport, M.D.    Dg Shoulder Right  05/05/2012  *RADIOLOGY REPORT*  Clinical Data: Pain post trauma  RIGHT SHOULDER - 2+ VIEW  Comparison: None.  Findings:  Internal rotation, external rotation, and Y scapular images were obtained.  There is no fracture or dislocation.  There are is mild bony overgrowth along the inferior glenoid.  There is no appreciable joint space narrowing.  No erosive change or intra- articular calcifications.  IMPRESSION:    No fracture or dislocation.  Mild bony overgrowth along inferior clavicle.   Original Report Authenticated By: Bretta Bang, M.D.    Dg Hip Complete Right  05/05/2012  *RADIOLOGY REPORT*  Clinical Data: Pain post trauma  RIGHT HIP - COMPLETE 2+ VIEW  Comparison: June 26, 2003  Findings:  Frontal pelvis as well as frontal and lateral right hip images were obtained.  There is no fracture or dislocation.  Joint spaces appear intact.  No erosive change.  IMPRESSION: No abnormality noted.   Original Report Authenticated By: Bretta Bang, M.D.    Dg Ankle Complete Right  05/05/2012  *RADIOLOGY REPORT*  Clinical Data: Pain post trauma  RIGHT ANKLE - COMPLETE 3+ VIEW  Comparison: None.  Findings: Frontal, oblique, and lateral views were obtained.  There is no fracture or effusion.  Ankle mortise appears intact.  There is mild osteoarthritic change medially.  There are spurs arising from the posterior and inferior calcaneus.  IMPRESSION:   Prominent calcaneal spurs.  No fracture.  Mortise intact.   Original Report Authenticated By: Bretta Bang, M.D.    Ct Knee Right Wo Contrast  05/05/2012  *RADIOLOGY REPORT*  Clinical Data: Fall.  Right knee pain.  CT OF THE RIGHT KNEE WITHOUT CONTRAST  Technique:  Multidetector CT imaging was performed according to the standard protocol. Multiplanar CT image reconstructions were also generated.  Comparison: 05/05/2012 radiographs  Findings: Medial with any compartmental prosthesis noted with polymer tibial component and lateral femoral component.  Sclerosis noted in the tibia adjacent to the tibial prosthetic component. Knee effusion noted with suspected fat fluid level compatible with likely hemarthrosis.  However, I do not see a well-defined fracture.  There is tri-compartmental spurring  in the spurring at all along the lateral tibial plateau causes a somewhat scalloped appearance in the lateral tibial plateau which could reflect mild impaction and stress fracture.  The sclerosis below the tibial component of the unit compartmental prosthesis could also be an indicator of stress fracture.  The distal femur appears to sit somewhat anteriorly on the tibia, query PCL insufficiency.  IMPRESSION: 1.  I do not observe a well-defined fracture, but there is a moderate joint effusion with apparent fat fluid level suggesting lipohemarthrosis which may indicate an otherwise occult fracture.  2.  Possible PCL insufficiency.  3.  Slight scalloping of the lateral tibial plateau is probably due to spurring rather than impaction, although there is some mild subcortical sclerosis which may be an indicator of impaction or subcortical stress injury in the  lateral tibial plateau.  4.  Sclerosis below the tray of the tibial component of the any compartmental prosthesis is probably chronic, and less likely to be due to acute stress injury.   Original Report Authenticated By: Gaylyn Rong, M.D.    Dg Knee Complete 4 Views Right  05/05/2012  *RADIOLOGY REPORT*  Clinical Data: Fall.  Knee pain after falling work.  History of partial knee replacement.  RIGHT KNEE - COMPLETE 4+ VIEW  Comparison: None.  Findings: Medial compartment knee arthroplasty is present. Depression and sclerosis of the medial tibial plateau is present. Polyethylene spacer appears present in the medial tibial plateau. Metal component is present in the medial femoral condyle. There is no displaced fracture identified.  There is a small knee effusion. The femoral component appears intact.  The tibial component is radiolucent and therefore difficult to evaluate.  Based on irregularity around the tibial component, it is difficult to exclude a fracture however there is no convincing evidence of cortical buckling or fracture plane to suggest an acute  injury. Without comparisons, it is impossible to assess for interval changes.  Atherosclerosis is present. Faint lucency is present in the superior pole of the patella which is favored to be projectional.  IMPRESSION: Medial compartment right knee arthroplasty. Moderate effusion. Irregularity of the tibial plateau component makes exclusion of acute injury difficult, particularly with lack of comparisons.  No displaced fracture. CT may be useful in further evaluation of the knee if there is high clinical index of suspicion for acute injury.   Original Report Authenticated By: Andreas Newport, M.D.      1. Fall, initial encounter   2. Knee effusion, right   3. Ankle pain, right   4. Hip pain, right   5. Coccyx pain       MDM  Discussed finding with pt and possibility of fracture in knee:pt seen by [place] previously and can follow up with him:pt splinted and given pain medication        Teressa Lower, NP 05/05/12 1424

## 2012-05-05 NOTE — ED Provider Notes (Signed)
Medical screening examination/treatment/procedure(s) were performed by non-physician practitioner and as supervising physician I was immediately available for consultation/collaboration.   Charles B. Sheldon, MD 05/05/12 1553 

## 2012-05-05 NOTE — ED Notes (Signed)
At work fell landing onto her right side. Pain in her right knee and right coccyx. Ice pack at time of accident.

## 2015-09-25 ENCOUNTER — Encounter (HOSPITAL_COMMUNITY): Payer: Self-pay

## 2015-09-25 ENCOUNTER — Emergency Department (HOSPITAL_COMMUNITY)
Admission: EM | Admit: 2015-09-25 | Discharge: 2015-09-26 | Disposition: A | Discharge status: Home / Self Care | Payer: Self-pay | Attending: Emergency Medicine | Admitting: Emergency Medicine

## 2015-09-25 DIAGNOSIS — Z794 Long term (current) use of insulin: Secondary | ICD-10-CM | POA: Insufficient documentation

## 2015-09-25 DIAGNOSIS — I10 Essential (primary) hypertension: Secondary | ICD-10-CM | POA: Insufficient documentation

## 2015-09-25 DIAGNOSIS — E1165 Type 2 diabetes mellitus with hyperglycemia: Secondary | ICD-10-CM | POA: Insufficient documentation

## 2015-09-25 DIAGNOSIS — I2510 Atherosclerotic heart disease of native coronary artery without angina pectoris: Secondary | ICD-10-CM | POA: Insufficient documentation

## 2015-09-25 DIAGNOSIS — F17210 Nicotine dependence, cigarettes, uncomplicated: Secondary | ICD-10-CM | POA: Insufficient documentation

## 2015-09-25 DIAGNOSIS — E1140 Type 2 diabetes mellitus with diabetic neuropathy, unspecified: Secondary | ICD-10-CM | POA: Insufficient documentation

## 2015-09-25 DIAGNOSIS — Z951 Presence of aortocoronary bypass graft: Secondary | ICD-10-CM | POA: Insufficient documentation

## 2015-09-25 DIAGNOSIS — R739 Hyperglycemia, unspecified: Secondary | ICD-10-CM

## 2015-09-25 DIAGNOSIS — G629 Polyneuropathy, unspecified: Secondary | ICD-10-CM

## 2015-09-25 LAB — CBC
HCT: 47.3 % — ABNORMAL HIGH (ref 36.0–46.0)
Hemoglobin: 16.3 g/dL — ABNORMAL HIGH (ref 12.0–15.0)
MCH: 31.9 pg (ref 26.0–34.0)
MCHC: 34.5 g/dL (ref 30.0–36.0)
MCV: 92.6 fL (ref 78.0–100.0)
Platelets: 244 10*3/uL (ref 150–400)
RBC: 5.11 MIL/uL (ref 3.87–5.11)
RDW: 12.3 % (ref 11.5–15.5)
WBC: 9.1 10*3/uL (ref 4.0–10.5)

## 2015-09-25 LAB — CBG MONITORING, ED
Glucose-Capillary: 406 mg/dL — ABNORMAL HIGH (ref 65–99)
Glucose-Capillary: 251 mg/dL — ABNORMAL HIGH (ref 65–99)
Glucose-Capillary: 273 mg/dL — ABNORMAL HIGH (ref 65–99)

## 2015-09-25 LAB — URINALYSIS, ROUTINE W REFLEX MICROSCOPIC
Bilirubin Urine: NEGATIVE
Glucose, UA: >1000 mg/dL — AB
Ketones, ur: NEGATIVE mg/dL
Leukocytes, UA: NEGATIVE
Nitrite: NEGATIVE
Protein, ur: 100 mg/dL — AB
Specific Gravity, Urine: 1.045 — ABNORMAL HIGH (ref 1.005–1.030)
pH: 5 (ref 5.0–8.0)

## 2015-09-25 LAB — BASIC METABOLIC PANEL
Anion gap: 9 (ref 5–15)
BUN: 8 mg/dL (ref 6–20)
CO2: 25 mmol/L (ref 22–32)
Calcium: 9.6 mg/dL (ref 8.9–10.3)
Chloride: 100 mmol/L — ABNORMAL LOW (ref 101–111)
Creatinine, Ser: 0.47 mg/dL (ref 0.44–1.00)
GFR calc Af Amer: >60 mL/min (ref >60)
GFR calc non Af Amer: >60 mL/min (ref >60)
Glucose, Bld: 452 mg/dL — ABNORMAL HIGH (ref 65–99)
Potassium: 4.1 mmol/L (ref 3.5–5.1)
Sodium: 134 mmol/L — ABNORMAL LOW (ref 135–145)

## 2015-09-25 LAB — URINE MICROSCOPIC-ADD ON

## 2015-09-25 MED ORDER — SODIUM CHLORIDE 0.9 % IV BOLUS (SEPSIS)
1000.0000 mL | Freq: Once | INTRAVENOUS | Status: AC
  Administered 2015-09-25: 1000 mL via INTRAVENOUS

## 2015-09-25 MED ORDER — METFORMIN HCL 1000 MG PO TABS: 1000.0000 mg | ORAL_TABLET | Freq: Two times a day (BID) | ORAL | 0 refills | Status: DC

## 2015-09-25 NOTE — Care Management (Signed)
ED CM consulted concerning assisting patient with care transition to primary care. ED CM met with patient she reports not seeing a doctor since 2013 when the [facility] closed. Patient is uninsured. Patient presented tonight with elevated CBG in the 400's patient has not taken meds since 2013 as per patient. Discussed the [facility] and services rendered, she is agreeable with establishing care at the clinic. CM assisted with scheduling appointment for Tuesday 9/19 at 12:15p with Dr. Lily Kocher. Patient verbalized understanding teach back done. Patient denies any further Updated C. Lawyer ib care transition plan. No further ED CM needs identified.

## 2015-09-25 NOTE — ED Triage Notes (Signed)
Pt reports generalized fatigue and tingling in the feet X2 weeks. Pt is known diabetic but has been off her medication for years. She reports CBG at home was 430. CBG in triage is 406.

## 2015-09-25 NOTE — Discharge Instructions (Signed)
Return here as needed.  Follow-up with the clinic provided.  °

## 2015-09-25 NOTE — ED Notes (Signed)
Patient up ambulatory to the bathroom at this time; patient will attempt to collect an urine specimen also

## 2015-09-25 NOTE — ED Provider Notes (Signed)
[facility]-EMERGENCY DEPT Provider Note   CSN: [id number] Arrival date & time: 09/25/15  1419     History   Chief Complaint Chief Complaint  Patient presents with  . Hyperglycemia  . Tingling    HPI Andrea [name] is a 65 y.o. female.  HPI Patient presents to the emergency department with tingling in both lower extremity and feet along with increased thirst and frequency of urination.  Patient states that she has not been taking her diabetes medicines over the last 4 years.  Patient states that she has been intermittently checking her blood sugars.  She states yesterday she checked it and it was 430.  Patient states that she did not take any medications prior to arrival.  States nothing condition better or worse.  Patient states that she has not seen her primary doctor in almost 5 years. The patient denies chest pain, shortness of breath, headache,blurred vision, neck pain, fever, cough, weakness, numbness, dizziness, anorexia, edema, abdominal pain, nausea, vomiting, diarrhea, rash, back pain, dysuria, hematemesis, bloody stool, near syncope, or syncope. Past Medical History:  Diagnosis Date  . Coronary artery disease   . Diabetes mellitus without complication (HCC)   . Fibromyalgia     Patient Active Problem List   Diagnosis Date Noted  . DIABETES MELLITUS, TYPE II 10/18/2006  . HYPERTENSION 10/18/2006  . LOW BACK PAIN, CHRONIC 10/18/2006    Past Surgical History:  Procedure Laterality Date  . CORONARY ARTERY BYPASS GRAFT    . KNEE SURGERY      OB History    No data available       Home Medications    Prior to Admission medications   Medication Sig Start Date End Date Taking? Authorizing Provider  IBUPROFEN PO Take by mouth.    Historical Provider, MD  insulin lispro (HUMALOG) 100 UNIT/ML injection Inject into the skin 3 (three) times daily before meals.    Historical Provider, MD  oxyCODONE-acetaminophen (PERCOCET/ROXICET) 5-325 MG per tablet Take 1-2 tablets by  mouth every 4 (four) hours as needed for pain. 05/05/12   Teressa Lower, NP    Family History History reviewed. No pertinent family history.  Social History Social History  Substance Use Topics  . Smoking status: Current Every Day Smoker    Packs/day: 0.50    Types: Cigarettes  . Smokeless tobacco: Never Used  . Alcohol use No     Allergies   Naproxen and Tramadol hcl   Review of Systems Review of Systems All other systems negative except as documented in the HPI. All pertinent positives and negatives as reviewed in the HPI.  Physical Exam Updated Vital Signs BP (!) 204/64 (BP Location: Right Arm)   Pulse 73   Temp 98.8 F (37.1 C) (Oral)   Resp 24   Ht 5\' 3"  (1.6 m)   Wt 65.8 kg   SpO2 94%   BMI 25.69 kg/m   Physical Exam  Constitutional: She is oriented to person, place, and time. She appears well-developed and well-nourished. No distress.  HENT:  Head: Normocephalic and atraumatic.  Mouth/Throat: Oropharynx is clear and moist.  Eyes: Pupils are equal, round, and reactive to light.  Neck: Normal range of motion. Neck supple.  Cardiovascular: Normal rate, regular rhythm and normal heart sounds.  Exam reveals no gallop and no friction rub.   No murmur heard. Pulmonary/Chest: Effort normal and breath sounds normal. No respiratory distress. She has no wheezes.  Abdominal: Soft. Bowel sounds are normal. She exhibits no distension. There is  no tenderness.  Neurological: She is alert and oriented to person, place, and time. She exhibits normal muscle tone. Coordination normal.  Skin: Skin is warm and dry. No rash noted. No erythema.  Psychiatric: She has a normal mood and affect. Her behavior is normal.  Nursing note and vitals reviewed.    ED Treatments / Results  Labs (all labs ordered are listed, but only abnormal results are displayed) Labs Reviewed  BASIC METABOLIC PANEL - Abnormal; Notable for the following:       Result Value   Sodium 134 (*)     Chloride 100 (*)    Glucose, Bld 452 (*)    All other components within normal limits  CBC - Abnormal; Notable for the following:    Hemoglobin 16.3 (*)    HCT 47.3 (*)    All other components within normal limits  URINALYSIS, ROUTINE W REFLEX MICROSCOPIC (NOT AT [facility]) - Abnormal; Notable for the following:    Specific Gravity, Urine 1.045 (*)    Glucose, UA >1000 (*)    Hgb urine dipstick TRACE (*)    Protein, ur 100 (*)    All other components within normal limits  URINE MICROSCOPIC-ADD ON - Abnormal; Notable for the following:    Squamous Epithelial / LPF 0-5 (*)    Bacteria, UA RARE (*)    Casts HYALINE CASTS (*)    All other components within normal limits  CBG MONITORING, ED - Abnormal; Notable for the following:    Glucose-Capillary 406 (*)    All other components within normal limits  CBG MONITORING, ED - Abnormal; Notable for the following:    Glucose-Capillary 251 (*)    All other components within normal limits  CBG MONITORING, ED    EKG  EKG Interpretation None       Radiology No results found.  Procedures Procedures (including critical care time)  Medications Ordered in ED Medications  sodium chloride 0.9 % bolus 1,000 mL (1,000 mLs Intravenous New Bag/Given 09/25/15 1934)     Initial Impression / Assessment and Plan / ED Course  I have reviewed the triage vital signs and the nursing notes.  Pertinent labs & imaging results that were available during my care of the patient were reviewed by me and considered in my medical decision making (see chart for details).  Clinical Course   The case manager has set up close follow-up for the patient and I have prescribed her metformin with case manager states she can obtain from the [facility] for free.  The patient is given the plan and all questions were answered.  Her laboratory testing was examined.  She is not in DKA.  He does not show any signs of any end organ damage at this time.  I advised the  patient return here as needed.  The patient's symptoms are most likely due to long-standing elevated blood sugars  Final Clinical Impressions(s) / ED Diagnoses   Final diagnoses:  None    New Prescriptions New Prescriptions   No medications on file     Charlestine NightChristopher Analayah Brooke, PA-C 09/27/15 0023    Raeford RazorStephen Kohut, MD 09/29/15 0730

## 2015-09-25 NOTE — ED Notes (Signed)
Patient haas returned from using the bathroom; patient undressed, in gown, on continuous pulse oximetry and blood pressure cuff

## 2015-09-26 NOTE — ED Notes (Signed)
Pt d/c home  

## 2015-10-01 ENCOUNTER — Inpatient Hospital Stay: Payer: Self-pay | Admitting: Internal Medicine

## 2015-10-01 ENCOUNTER — Inpatient Hospital Stay: Payer: Self-pay | Admitting: Family Medicine

## 2015-10-16 ENCOUNTER — Encounter: Payer: Self-pay | Admitting: Internal Medicine

## 2015-10-16 ENCOUNTER — Ambulatory Visit: Payer: Self-pay | Attending: Internal Medicine | Admitting: Internal Medicine

## 2015-10-16 VITALS — BP 176/77 | HR 71 | Temp 98.7°F | Resp 16 | Wt 139.4 lb

## 2015-10-16 DIAGNOSIS — E0865 Diabetes mellitus due to underlying condition with hyperglycemia: Secondary | ICD-10-CM

## 2015-10-16 DIAGNOSIS — Z951 Presence of aortocoronary bypass graft: Secondary | ICD-10-CM | POA: Insufficient documentation

## 2015-10-16 DIAGNOSIS — Z885 Allergy status to narcotic agent status: Secondary | ICD-10-CM | POA: Insufficient documentation

## 2015-10-16 DIAGNOSIS — E1165 Type 2 diabetes mellitus with hyperglycemia: Secondary | ICD-10-CM | POA: Insufficient documentation

## 2015-10-16 DIAGNOSIS — E0840 Diabetes mellitus due to underlying condition with diabetic neuropathy, unspecified: Secondary | ICD-10-CM

## 2015-10-16 DIAGNOSIS — E1140 Type 2 diabetes mellitus with diabetic neuropathy, unspecified: Secondary | ICD-10-CM | POA: Insufficient documentation

## 2015-10-16 DIAGNOSIS — Z9889 Other specified postprocedural states: Secondary | ICD-10-CM | POA: Insufficient documentation

## 2015-10-16 DIAGNOSIS — Z720 Tobacco use: Secondary | ICD-10-CM

## 2015-10-16 DIAGNOSIS — M797 Fibromyalgia: Secondary | ICD-10-CM

## 2015-10-16 DIAGNOSIS — I2510 Atherosclerotic heart disease of native coronary artery without angina pectoris: Secondary | ICD-10-CM | POA: Insufficient documentation

## 2015-10-16 DIAGNOSIS — I10 Essential (primary) hypertension: Secondary | ICD-10-CM

## 2015-10-16 DIAGNOSIS — E785 Hyperlipidemia, unspecified: Secondary | ICD-10-CM | POA: Insufficient documentation

## 2015-10-16 DIAGNOSIS — J449 Chronic obstructive pulmonary disease, unspecified: Secondary | ICD-10-CM | POA: Insufficient documentation

## 2015-10-16 DIAGNOSIS — B3731 Acute candidiasis of vulva and vagina: Secondary | ICD-10-CM

## 2015-10-16 DIAGNOSIS — Z888 Allergy status to other drugs, medicaments and biological substances status: Secondary | ICD-10-CM | POA: Insufficient documentation

## 2015-10-16 DIAGNOSIS — F17210 Nicotine dependence, cigarettes, uncomplicated: Secondary | ICD-10-CM | POA: Insufficient documentation

## 2015-10-16 DIAGNOSIS — Z0000 Encounter for general adult medical examination without abnormal findings: Secondary | ICD-10-CM

## 2015-10-16 DIAGNOSIS — Z7984 Long term (current) use of oral hypoglycemic drugs: Secondary | ICD-10-CM | POA: Insufficient documentation

## 2015-10-16 DIAGNOSIS — Z114 Encounter for screening for human immunodeficiency virus [HIV]: Secondary | ICD-10-CM

## 2015-10-16 DIAGNOSIS — B373 Candidiasis of vulva and vagina: Secondary | ICD-10-CM

## 2015-10-16 DIAGNOSIS — F32A Depression, unspecified: Secondary | ICD-10-CM

## 2015-10-16 DIAGNOSIS — Z1159 Encounter for screening for other viral diseases: Secondary | ICD-10-CM

## 2015-10-16 DIAGNOSIS — Z23 Encounter for immunization: Secondary | ICD-10-CM

## 2015-10-16 DIAGNOSIS — F329 Major depressive disorder, single episode, unspecified: Secondary | ICD-10-CM

## 2015-10-16 DIAGNOSIS — I25810 Atherosclerosis of coronary artery bypass graft(s) without angina pectoris: Secondary | ICD-10-CM

## 2015-10-16 DIAGNOSIS — IMO0002 Reserved for concepts with insufficient information to code with codable children: Secondary | ICD-10-CM

## 2015-10-16 LAB — GLUCOSE, POCT (MANUAL RESULT ENTRY): POC Glucose: 288 mg/dl — AB (ref 70–99)

## 2015-10-16 LAB — LIPID PANEL
CHOL/HDL RATIO: 5.5 ratio — AB (ref <=5.0)
CHOLESTEROL: 309 mg/dL — AB (ref 125–200)
HDL: 56 mg/dL (ref >=46)
LDL Cholesterol: 201 mg/dL — ABNORMAL HIGH (ref <130)
Triglycerides: 258 mg/dL — ABNORMAL HIGH (ref <150)
VLDL: 52 mg/dL — AB (ref <30)

## 2015-10-16 LAB — TSH: TSH: 0.6 m[IU]/L

## 2015-10-16 LAB — POCT GLYCOSYLATED HEMOGLOBIN (HGB A1C): HEMOGLOBIN A1C: 13

## 2015-10-16 MED ORDER — NICOTINE 21 MG/24HR TD PT24: 21.0000 mg | MEDICATED_PATCH | Freq: Every day | TRANSDERMAL | 0 refills | Status: DC

## 2015-10-16 MED ORDER — AMITRIPTYLINE HCL 25 MG PO TABS: 25.0000 mg | ORAL_TABLET | Freq: Every day | ORAL | 2 refills | Status: DC

## 2015-10-16 MED ORDER — FLUCONAZOLE 150 MG PO TABS: 150.0000 mg | ORAL_TABLET | Freq: Every day | ORAL | 0 refills | Status: DC

## 2015-10-16 MED ORDER — LISINOPRIL-HYDROCHLOROTHIAZIDE 10-12.5 MG PO TABS: 1.0000 | ORAL_TABLET | Freq: Every day | ORAL | 3 refills | Status: DC

## 2015-10-16 MED ORDER — FLUTICASONE-SALMETEROL 100-50 MCG/DOSE IN AEPB: 1.0000 | INHALATION_SPRAY | Freq: Two times a day (BID) | RESPIRATORY_TRACT | 3 refills | Status: DC

## 2015-10-16 MED ORDER — NYSTATIN 100000 UNIT/GM EX POWD: Freq: Four times a day (QID) | CUTANEOUS | 0 refills | Status: DC

## 2015-10-16 MED ORDER — ALBUTEROL SULFATE HFA 108 (90 BASE) MCG/ACT IN AERS: 2.0000 | INHALATION_SPRAY | Freq: Four times a day (QID) | RESPIRATORY_TRACT | 2 refills | Status: DC | PRN

## 2015-10-16 MED ORDER — METFORMIN HCL 1000 MG PO TABS: 1000.0000 mg | ORAL_TABLET | Freq: Two times a day (BID) | ORAL | 3 refills | Status: DC

## 2015-10-16 MED ORDER — GABAPENTIN 100 MG PO CAPS: 100.0000 mg | ORAL_CAPSULE | Freq: Three times a day (TID) | ORAL | 3 refills | Status: DC | PRN

## 2015-10-16 MED ORDER — ASPIRIN EC 81 MG PO TBEC: 81.0000 mg | DELAYED_RELEASE_TABLET | Freq: Every day | ORAL | 3 refills | Status: AC

## 2015-10-16 MED ORDER — TRUE METRIX GO GLUCOSE METER W/DEVICE KIT: 1.0000 | PACK | Freq: Three times a day (TID) | 0 refills | Status: DC | PRN

## 2015-10-16 MED ORDER — CARBAMIDE PEROXIDE 6.5 % OT SOLN: 5.0000 [drp] | Freq: Two times a day (BID) | OTIC | 0 refills | Status: DC

## 2015-10-16 MED ORDER — INSULIN GLARGINE 100 UNIT/ML SOLOSTAR PEN: 15.0000 [IU] | PEN_INJECTOR | Freq: Every day | SUBCUTANEOUS | 3 refills | Status: DC

## 2015-10-16 MED ORDER — INSULIN PEN NEEDLE 32G X 4 MM MISC: 1.0000 | Freq: Every day | 2 refills | Status: DC

## 2015-10-16 MED ORDER — INSULIN GLARGINE 100 UNIT/ML SOLOSTAR PEN: 20.0000 [IU] | PEN_INJECTOR | Freq: Every day | SUBCUTANEOUS | 3 refills | Status: DC

## 2015-10-16 MED ORDER — TRUEPLUS LANCETS 26G MISC: 1.0000 | Freq: Three times a day (TID) | 12 refills | Status: DC | PRN

## 2015-10-16 MED ORDER — GLUCOSE BLOOD VI STRP: ORAL_STRIP | 12 refills | Status: DC

## 2015-10-16 MED FILL — GABAPENTIN 100 MG CAPSULE: 100 | 30 days supply | Qty: 90 | Fill #0

## 2015-10-16 MED FILL — EAR DROPS 6.5%: 6.5 | 20 days supply | Qty: 15 | Fill #0

## 2015-10-16 MED FILL — FLUCONAZOLE 150 MG TABLET: 150 | 1 days supply | Qty: 1 | Fill #0

## 2015-10-16 MED FILL — AMITRIPTYLINE HCL 25 MG TAB: 25 | 30 days supply | Qty: 30 | Fill #0

## 2015-10-16 MED FILL — TRUEplus LANCETS 28G MISC: 25 days supply | Qty: 100 | Fill #0

## 2015-10-16 MED FILL — NYSTOP 100,000 UNITS/GM PWD: 100000 | 20 days supply | Qty: 15 | Fill #0

## 2015-10-16 MED FILL — VENTOLIN HFA 90 MCG INHALER: 108 (90 BAS | 20 days supply | Qty: 18 | Fill #0

## 2015-10-16 MED FILL — **ADVAIR 100/50 DISKUS: 100-50 MCG | 30 days supply | Qty: 28 | Fill #0

## 2015-10-16 MED FILL — LANTUS SOLOSTAR 100 UNITS/M: 100 | 30 days supply | Qty: 6 | Fill #0

## 2015-10-16 MED FILL — TRUE METRIX BLOOD GLUCOSE M: W/DEVICE | 1 days supply | Qty: 1 | Fill #0

## 2015-10-16 MED FILL — ULTICARE PEN NDL 4MM 32G: 32G X 4 MM | 25 days supply | Qty: 100 | Fill #0

## 2015-10-16 MED FILL — LISINOPRIL-HCTZ 10-12.5 MG: 10-12.5 | 30 days supply | Qty: 30 | Fill #0

## 2015-10-16 MED FILL — metFORMIN HCL 1000 MG TABS: 1000 | 30 days supply | Qty: 60 | Fill #0

## 2015-10-16 MED FILL — TRUE METRIX TEST STRIP: 25 days supply | Qty: 100 | Fill #0

## 2015-10-16 NOTE — Progress Notes (Signed)
Andrea [name], is a 65 y.o. female  ZOX:[id number]:[id number]  [id number]  DOB - July 17, 1950  CC:  Chief Complaint  Patient presents with  . Follow-up       HPI: Andrea [name] is a 65 y.o. female here today to establish medical care.  New to our clinic.  Pt has hx of cad, sp 3v CABG  1984, htn, hld, fibromyalgia, copd (does not have any inhalers), dm, off all medicine for "years."  She went to ED on 09/25/15 for tingling in her feet and hyperglycemia, found w/ OOC dm and started on metformin 1000bid and sent to our clinic for further eval.  Co of le tingling /pain x 1 month now.  Pt smokes 1ppd , for "years". Tried quitting in past w/o success.  She gets doe at times, does not have any inhalers.  No current sob/cough/f/c, had inhalers in past, but none currently.   Is depressed at times, will start crying for no reason.  Not on any meds. Denies si/hi/avh.  Co of resolving yeast infection, vaginal area, some pruritis, improved some since starting metformin.  Patient has No headache, No chest pain, No abdominal pain - No Nausea, No Cough - SOB.    Review of Systems: Per HPI, o/w all systems reviewed and negative.   Allergies  Allergen Reactions  . Naproxen   . Tramadol Hcl    Past Medical History:  Diagnosis Date  . Coronary artery disease   . Diabetes mellitus without complication (HCC)   . Fibromyalgia    Current Outpatient Prescriptions on File Prior to Visit  Medication Sig Dispense Refill  . IBUPROFEN PO Take by mouth.    . oxyCODONE-acetaminophen (PERCOCET/ROXICET) 5-325 MG per tablet Take 1-2 tablets by mouth every 4 (four) hours as needed for pain. (Patient not taking: Reported on 10/16/2015) 15 tablet 0   No current facility-administered medications on file prior to visit.    No family history on file. Social History   Social History  . Marital status: Widowed    Spouse name: N/A  . Number of children: N/A  . Years of education: N/A   Occupational History   . Not on file.   Social History Main Topics  . Smoking status: Current Every Day Smoker    Packs/day: 0.50    Types: Cigarettes  . Smokeless tobacco: Never Used  . Alcohol use No  . Drug use: No  . Sexual activity: Not on file   Other Topics Concern  . Not on file   Social History Narrative  . No narrative on file    Objective:   Vitals:   10/16/15 1128  BP: (!) 176/77  Pulse: 71  Resp: 16  Temp: 98.7 F (37.1 C)    Filed Weights   10/16/15 1128  Weight: 139 lb 6.4 oz (63.2 kg)    BP Readings from Last 3 Encounters:  10/16/15 (!) 176/77  09/26/15 140/81  05/05/12 191/65    Physical Exam: Constitutional: Patient appears well-developed and well-nourished. No distress. AAOx3, She is currently crying on/off during clinic visit.  HENT: Normocephalic, atraumatic, External right and left ear normal. Oropharynx is clear and moist.  bilat ears w/ ceruminosis impaction, dry Eyes: Conjunctivae and EOM are normal. PERRL, no scleral icterus. Neck: Normal ROM. Neck supple. No JVD. CVS: RRR, S1/S2 +, no murmurs, no gallops, no carotid bruit.  Pulmonary: Effort normal, reduced breath sounds throughout, no stridor, rhonchi, wheezes, rales.  Abdominal: Soft. BS +, no distension, tenderness, rebound or  guarding.  Musculoskeletal: Normal range of motion. No edema and no tenderness.  LE: bilat/ no c/c/e, pulses 2+ bilateral. Neuro: Alert.  muscle tone coordination wnl. No cranial nerve deficit grossly. Skin: Skin is warm and dry. No rash noted. Not diaphoretic. No erythema. No pallor. Psychiatric:   Behavior, judgment, thought content normal.  Mood labile, will cry/tearful intermittently during exam when going over her medical problems.  Lab Results  Component Value Date   WBC 9.1 09/25/2015   HGB 16.3 (H) 09/25/2015   HCT 47.3 (H) 09/25/2015   MCV 92.6 09/25/2015   PLT 244 09/25/2015   Lab Results  Component Value Date   CREATININE 0.47 09/25/2015   BUN 8 09/25/2015    NA 134 (L) 09/25/2015   K 4.1 09/25/2015   CL 100 (L) 09/25/2015   CO2 25 09/25/2015    Lab Results  Component Value Date   HGBA1C 13.0 10/16/2015   Lipid Panel     Component Value Date/Time   CHOL 232 (H) 01/25/2007 2033   TRIG 196 (H) 01/25/2007 2033   HDL 49 01/25/2007 2033   CHOLHDL 4.7 Ratio 01/25/2007 2033   VLDL 39 01/25/2007 2033   LDLCALC 144 (H) 01/25/2007 2033       Depression screen PHQ 2/9 10/16/2015  Decreased Interest 3  Down, Depressed, Hopeless 3  PHQ - 2 Score 6  Altered sleeping 3  Tired, decreased energy 3  Change in appetite 3  Feeling bad or failure about yourself  3  Trouble concentrating 3  Moving slowly or fidgety/restless 3  Suicidal thoughts 0  PHQ-9 Score 24    Assessment and plan:   1. Diabetes mellitus due to underlying condition, uncontrolled, with diabetic neuropathy, without long-term current use of insulin (HCC) Ooc, continue metformin 1000bid - start lantus 20units qhs - Glucose (CBG) 288 - HgB A1c 13.0 - Microalbumin/Creatinine Ratio, Urine - Lipid Panel - Stacey, clinical pharmacist, to instruct pt on lantus today. - f/u [facility] for dm chk in 2 wks or so. - glucometer/supplies sent to pharmacy as well. - asa 81 mg started as well  2. Essential hypertension Ooc, started prinzide 10-12.5 qd for now  3. Fibromyalgia w/ depression - denies si/hi/avh - trial amitriptyline 25 qhs - provided pt  W/ area resources for counceling/monarch/family services, etc,   4. Tobacco abuse Total cessation recd, trial nicoderm patch 21mg  qd, do not smoke w/ it  5. Vaginal yeast infection From ooc dm, activia yogurt recd bid, diflucan 150mg  x 1 po  6. Encounter for screening for HIV - HIV antibody (with reflex)  7. Encounter for hepatitis C screening test for low risk patient - Hepatitis C antibody  8. Healthcare maintenance - needs mm/colonoscopy - needs to apply for financial aid first - Vitamin D, 25-hydroxy -  TSH  9. Coronary artery disease involving coronary bypass graft of native heart without angina pectoris Prior cabg 3v `984, no angina complaints currently  10. Encounter for immunization - Flu Vaccine QUAD 36+ mos IM   Return in about 4 weeks (around 11/13/2015) for dm / depression.  The patient was given clear instructions to go to ER or return to medical center if symptoms don't improve, worsen or new problems develop. The patient verbalized understanding. The patient was told to call to get lab results if they haven't heard anything in the next week.    This note has been created with Education officer, environmental. Any transcriptional errors are  unintentional.   Pete Glatter, MD, MBA/MHA [facility] [place], [place] [phone]   10/16/2015, 12:02 PM

## 2015-10-16 NOTE — Patient Instructions (Addendum)
[name]k 2-3 wks    Diabetes Mellitus and Food It is important for you to manage your blood sugar (glucose) level. Your blood glucose level can be greatly affected by what you eat. Eating healthier foods in the appropriate amounts throughout the day at about the same time each day will help you control your blood glucose level. It can also help slow or prevent worsening of your diabetes mellitus. Healthy eating may even help you improve the level of your blood pressure and reach or maintain a healthy weight.  General recommendations for healthful eating and cooking habits include:  Eating meals and snacks regularly. Avoid going long periods of time without eating to lose weight.  Eating a diet that consists mainly of plant-based foods, such as fruits, vegetables, nuts, legumes, and whole grains.  Using low-heat cooking methods, such as baking, instead of high-heat cooking methods, such as deep frying. Work with your dietitian to make sure you understand how to use the Nutrition Facts information on food labels. HOW CAN FOOD AFFECT ME? Carbohydrates Carbohydrates affect your blood glucose level more than any other type of food. Your dietitian will help you determine how many carbohydrates to eat at each meal and teach you how to count carbohydrates. Counting carbohydrates is important to keep your blood glucose at a healthy level, especially if you are using insulin or taking certain medicines for diabetes mellitus. Alcohol Alcohol can cause sudden decreases in blood glucose (hypoglycemia), especially if you use insulin or take certain medicines for diabetes mellitus. Hypoglycemia can be a life-threatening condition. Symptoms of hypoglycemia (sleepiness, dizziness, and disorientation) are similar to symptoms of having too much alcohol.  If your health care provider has given you approval to drink alcohol, do so in moderation and use the following guidelines:  Women should  not have more than one drink per day, and men should not have more than two drinks per day. One drink is equal to:  12 oz of beer.  5 oz of wine.  1 oz of hard liquor.  Do not drink on an empty stomach.  Keep yourself hydrated. Have water, diet soda, or unsweetened iced tea.  Regular soda, juice, and other mixers might contain a lot of carbohydrates and should be counted. WHAT FOODS ARE NOT RECOMMENDED? As you make food choices, it is important to remember that all foods are not the same. Some foods have fewer nutrients per serving than other foods, even though they might have the same number of calories or carbohydrates. It is difficult to get your body what it needs when you eat foods with fewer nutrients. Examples of foods that you should avoid that are high in calories and carbohydrates but low in nutrients include:  Trans fats (most processed foods list trans fats on the Nutrition Facts label).  Regular soda.  Juice.  Candy.  Sweets, such as cake, pie, doughnuts, and cookies.  Fried foods. WHAT FOODS CAN I EAT? Eat nutrient-rich foods, which will nourish your body and keep you healthy. The food you should eat also will depend on several factors, including:  The calories you need.  The medicines you take.  Your weight.  Your blood glucose level.  Your blood pressure level.  Your cholesterol level. You should eat a variety of foods, including:  Protein.  Lean cuts of meat.  Proteins low in saturated fats, such as fish, egg whites, and beans. Avoid processed meats.  Fruits and vegetables.  Fruits and vegetables  that may help control blood glucose levels, such as apples, mangoes, and yams.  Dairy products.  Choose fat-free or low-fat dairy products, such as milk, yogurt, and cheese.  Grains, bread, pasta, and rice.  Choose whole grain products, such as multigrain bread, whole oats, and brown rice. These foods may help control blood  pressure.  Fats.  Foods containing healthful fats, such as nuts, avocado, olive oil, canola oil, and fish. DOES EVERYONE WITH DIABETES MELLITUS HAVE THE SAME MEAL PLAN? Because every person with diabetes mellitus is different, there is not one meal plan that works for everyone. It is very important that you meet with a dietitian who will help you create a meal plan that is just right for you.   This information is not intended to replace advice given to you by your health care provider. Make sure you discuss any questions you have with your health care provider.   Document Released: 09/25/2004 Document Revised: 01/19/2014 Document Reviewed: 11/25/2012 Elsevier Interactive Patient Education 2016 [employer]  - Tips for Eating Away From Home If You Have Diabetes Controlling your level of blood glucose, also known as blood sugar, can be challenging. It can be even more difficult when you do not prepare your own meals. The following tips can help you manage your diabetes when you eat away from home. PLANNING AHEAD Plan ahead if you know you will be eating away from home:  Ask your health care provider how to time meals and medicine if you are taking insulin.  Make a list of restaurants near you that offer healthy choices. If they have a carry-out menu, take it home and plan what you will order ahead of time.  Look up the restaurant you want to eat at online. Many chain and fast-food restaurants list nutritional information online. Use this information to choose the healthiest options and to calculate how many carbohydrates will be in your meal.  Use a carbohydrate-counting book or mobile app to look up the carbohydrate content and serving size of the foods you want to eat.  Become familiar with serving sizes and learn to recognize how many servings are in a portion. This will allow you to estimate how many carbohydrates you can eat. FREE FOODS A "free food" is any food or drink that has  less than 5 g of carbohydrates per serving. Free foods include:  Many vegetables.  Hard boiled eggs.  Nuts or seeds.  Olives.  Cheeses.  Meats. These types of foods make good appetizer choices and are often available at salad bars. Lemon juice, vinegar, or a low-calorie salad dressing of fewer than 20 calories per serving can be used as a "free" salad dressing.  CHOICES TO REDUCE CARBOHYDRATES  Substitute nonfat sweetened yogurt with a sugar-free yogurt. Yogurt made from soy milk may also be used, but you will still want a sugar-free or plain option to choose a lower carbohydrate amount.  Ask your server to take away the bread basket or chips from your table.  Order fresh fruit. A salad bar often offers fresh fruit choices. Avoid canned fruit because it is usually packed in sugar or syrup.  Order a salad, and eat it without dressing. Or, create a "free" salad dressing.  Ask for substitutions. For example, instead of [place] fries, request an order of a vegetable such as salad, green beans, or broccoli. OTHER TIPS   If you take insulin, take the insulin once your food arrives to your table. This will ensure your  insulin and food are timed correctly.  Ask your server about the portion size before your order, and ask for a take-out box if the portion has more servings than you should have. When your food comes, leave the amount you should have on the plate, and put the rest in the take-out box.  Consider splitting an entree with someone and ordering a side salad.   This information is not intended to replace advice given to you by your health care provider. Make sure you discuss any questions you have with your health care provider.   Document Released: 12/29/2004 Document Revised: 09/19/2014 Document Reviewed: 03/28/2013 Elsevier Interactive Patient Education 2016 Elsevier Inc.  - DASH Eating Plan DASH stands for "Dietary Approaches to Stop Hypertension." The DASH eating plan is  a healthy eating plan that has been shown to reduce high blood pressure (hypertension). Additional health benefits may include reducing the risk of type 2 diabetes mellitus, heart disease, and stroke. The DASH eating plan may also help with weight loss. WHAT DO I NEED TO KNOW ABOUT THE DASH EATING PLAN? For the DASH eating plan, you will follow these general guidelines:  Choose foods with a percent daily value for sodium of less than 5% (as listed on the food label).  Use salt-free seasonings or herbs instead of table salt or sea salt.  Check with your health care provider or pharmacist before using salt substitutes.  Eat lower-sodium products, often labeled as "lower sodium" or "no salt added."  Eat fresh foods.  Eat more vegetables, fruits, and low-fat dairy products.  Choose whole grains. Look for the word "whole" as the first word in the ingredient list.  Choose fish and skinless chicken or [place] more often than red meat. Limit fish, poultry, and meat to 6 oz (170 g) each day.  Limit sweets, desserts, sugars, and sugary drinks.  Choose heart-healthy fats.  Limit cheese to 1 oz (28 g) per day.  Eat more home-cooked food and less restaurant, buffet, and fast food.  Limit fried foods.  Cook foods using methods other than frying.  Limit canned vegetables. If you do use them, rinse them well to decrease the sodium.  When eating at a restaurant, ask that your food be prepared with less salt, or no salt if possible. WHAT FOODS CAN I EAT? Seek help from a dietitian for individual calorie needs. Grains Whole grain or whole wheat bread. Brown rice. Whole grain or whole wheat pasta. Quinoa, bulgur, and whole grain cereals. Low-sodium cereals. Corn or whole wheat flour tortillas. Whole grain cornbread. Whole grain crackers. Low-sodium crackers. Vegetables Fresh or frozen vegetables (raw, steamed, roasted, or grilled). Low-sodium or reduced-sodium tomato and vegetable juices.  Low-sodium or reduced-sodium tomato sauce and paste. Low-sodium or reduced-sodium canned vegetables.  Fruits All fresh, canned (in natural juice), or frozen fruits. Meat and Other Protein Products Ground beef (85% or leaner), grass-fed beef, or beef trimmed of fat. Skinless chicken or [place]. Ground chicken or [place]. Pork trimmed of fat. All fish and seafood. Eggs. Dried beans, peas, or lentils. Unsalted nuts and seeds. Unsalted canned beans. Dairy Low-fat dairy products, such as skim or 1% milk, 2% or reduced-fat cheeses, low-fat ricotta or cottage cheese, or plain low-fat yogurt. Low-sodium or reduced-sodium cheeses. Fats and Oils Tub margarines without trans fats. Light or reduced-fat mayonnaise and salad dressings (reduced sodium). Avocado. Safflower, olive, or canola oils. Natural peanut or almond butter. Other Unsalted popcorn and pretzels. The items listed above may not be a complete  list of recommended foods or beverages. Contact your dietitian for more options. WHAT FOODS ARE NOT RECOMMENDED? Grains White bread. White pasta. White rice. Refined cornbread. Bagels and croissants. Crackers that contain trans fat. Vegetables Creamed or fried vegetables. Vegetables in a cheese sauce. Regular canned vegetables. Regular canned tomato sauce and paste. Regular tomato and vegetable juices. Fruits Dried fruits. Canned fruit in light or heavy syrup. Fruit juice. Meat and Other Protein Products Fatty cuts of meat. Ribs, chicken wings, bacon, sausage, bologna, salami, chitterlings, fatback, hot dogs, bratwurst, and packaged luncheon meats. Salted nuts and seeds. Canned beans with salt. Dairy Whole or 2% milk, cream, half-and-half, and cream cheese. Whole-fat or sweetened yogurt. Full-fat cheeses or blue cheese. Nondairy creamers and whipped toppings. Processed cheese, cheese spreads, or cheese curds. Condiments Onion and garlic salt, seasoned salt, table salt, and sea salt. Canned and packaged  gravies. Worcestershire sauce. Tartar sauce. Barbecue sauce. Teriyaki sauce. Soy sauce, including reduced sodium. Steak sauce. Fish sauce. Oyster sauce. Cocktail sauce. Horseradish. Ketchup and mustard. Meat flavorings and tenderizers. Bouillon cubes. Hot sauce. Tabasco sauce. Marinades. Taco seasonings. Relishes. Fats and Oils Butter, stick margarine, lard, shortening, ghee, and bacon fat. Coconut, palm kernel, or palm oils. Regular salad dressings. Other Pickles and olives. Salted popcorn and pretzels. The items listed above may not be a complete list of foods and beverages to avoid. Contact your dietitian for more information. WHERE CAN I FIND MORE INFORMATION? National Heart, Lung, and Blood Institute: [employer]   This information is not intended to replace advice given to you by your health care provider. Make sure you discuss any questions you have with your health care provider.   Document Released: 12/18/2010 Document Revised: 01/19/2014 Document Reviewed: 11/02/2012 Elsevier Interactive Patient Education 2016 Elsevier Inc.   -  Hypertension Hypertension is another name for high blood pressure. High blood pressure forces your heart to work harder to pump blood. A blood pressure reading has two numbers, which includes a higher number over a lower number (example: 110/72). HOME CARE   Have your blood pressure rechecked by your doctor.  Only take medicine as told by your doctor. Follow the directions carefully. The medicine does not work as well if you skip doses. Skipping doses also puts you at risk for problems.  Do not smoke.  Monitor your blood pressure at home as told by your doctor. GET HELP IF:  You think you are having a reaction to the medicine you are taking.  You have repeat headaches or feel dizzy.  You have puffiness (swelling) in your ankles.  You have trouble with your vision. GET HELP RIGHT AWAY IF:   You get a  very bad headache and are confused.  You feel weak, numb, or faint.  You get chest or belly (abdominal) pain.  You throw up (vomit).  You cannot breathe very well. MAKE SURE YOU:   Understand these instructions.  Will watch your condition.  Will get help right away if you are not doing well or get worse.   This information is not intended to replace advice given to you by your health care provider. Make sure you discuss any questions you have with your health care provider.   Document Released: 06/17/2007 Document Revised: 01/03/2013 Document Reviewed: 10/21/2012 Elsevier Interactive Patient Education [employer]

## 2015-10-17 ENCOUNTER — Other Ambulatory Visit: Payer: Self-pay | Admitting: Internal Medicine

## 2015-10-17 LAB — MICROALBUMIN / CREATININE URINE RATIO
Creatinine, Urine: 94 mg/dL (ref 20–320)
MICROALB/CREAT RATIO: 1430 ug/mg{creat} — AB (ref <30)
Microalb, Ur: 134.4 mg/dL

## 2015-10-17 LAB — HIV ANTIBODY (ROUTINE TESTING W REFLEX): HIV: NONREACTIVE

## 2015-10-17 LAB — VITAMIN D 25 HYDROXY (VIT D DEFICIENCY, FRACTURES): VIT D 25 HYDROXY: 35 ng/mL (ref 30–100)

## 2015-10-17 LAB — HEPATITIS C ANTIBODY: HCV AB: NEGATIVE

## 2015-10-17 MED ORDER — ATORVASTATIN CALCIUM 80 MG PO TABS: 80.0000 mg | ORAL_TABLET | Freq: Every day | ORAL | 3 refills | Status: DC

## 2015-10-17 MED FILL — ATORVASTATIN 80 MG TABLET: 80 | 30 days supply | Qty: 30 | Fill #0

## 2015-10-18 ENCOUNTER — Telehealth: Payer: Self-pay

## 2015-10-18 NOTE — Telephone Encounter (Signed)
Contacted pt to go over lab results pt didn't answer lvm asking pt to give me a call back at her earliest convenience  

## 2015-10-18 NOTE — Telephone Encounter (Signed)
Pt returned call and is aware of results and doesn't have any questions or concerns 

## 2015-10-30 ENCOUNTER — Encounter: Payer: Self-pay | Admitting: Family Medicine

## 2015-10-30 ENCOUNTER — Encounter (INDEPENDENT_AMBULATORY_CARE_PROVIDER_SITE_OTHER): Payer: Self-pay

## 2015-10-30 ENCOUNTER — Ambulatory Visit: Payer: Self-pay | Attending: Family Medicine | Admitting: Family Medicine

## 2015-10-30 VITALS — BP 121/73 | HR 99 | Temp 100.2°F | Resp 16 | Wt 132.4 lb

## 2015-10-30 DIAGNOSIS — Z7951 Long term (current) use of inhaled steroids: Secondary | ICD-10-CM | POA: Insufficient documentation

## 2015-10-30 DIAGNOSIS — Z794 Long term (current) use of insulin: Secondary | ICD-10-CM | POA: Insufficient documentation

## 2015-10-30 DIAGNOSIS — M797 Fibromyalgia: Secondary | ICD-10-CM | POA: Insufficient documentation

## 2015-10-30 DIAGNOSIS — Z951 Presence of aortocoronary bypass graft: Secondary | ICD-10-CM | POA: Insufficient documentation

## 2015-10-30 DIAGNOSIS — Z79899 Other long term (current) drug therapy: Secondary | ICD-10-CM | POA: Insufficient documentation

## 2015-10-30 DIAGNOSIS — R112 Nausea with vomiting, unspecified: Secondary | ICD-10-CM

## 2015-10-30 DIAGNOSIS — B349 Viral infection, unspecified: Secondary | ICD-10-CM

## 2015-10-30 DIAGNOSIS — I2510 Atherosclerotic heart disease of native coronary artery without angina pectoris: Secondary | ICD-10-CM | POA: Insufficient documentation

## 2015-10-30 DIAGNOSIS — E119 Type 2 diabetes mellitus without complications: Secondary | ICD-10-CM

## 2015-10-30 LAB — GLUCOSE, POCT (MANUAL RESULT ENTRY): POC GLUCOSE: 192 mg/dL — AB (ref 70–99)

## 2015-10-30 MED ORDER — PROMETHAZINE HCL 25 MG PO TABS: 25.0000 mg | ORAL_TABLET | Freq: Three times a day (TID) | ORAL | 0 refills | Status: DC | PRN

## 2015-10-30 MED FILL — PROMETHAZINE 25 MG TABLET: 25 | 6 days supply | Qty: 20 | Fill #0

## 2015-10-30 NOTE — Patient Instructions (Signed)

## 2015-10-30 NOTE — Progress Notes (Signed)
Pt is in the office today for URI Pt states her pain level is a 10 Pt states she is not taking anything for the pain Pt states she has been throwing up for 3 days  Pt states she has a low grade fever

## 2015-10-31 ENCOUNTER — Telehealth: Payer: Self-pay

## 2015-10-31 LAB — INFLUENZA A AND B AG, IMMUNOASSAY
INFLUENZA A ANTIGEN: NOT DETECTED
INFLUENZA B ANTIGEN: NOT DETECTED

## 2015-10-31 NOTE — Telephone Encounter (Signed)
-----   Message from Enobong Amao, MD sent at 10/31/2015  1:26 PM EDT ----- Please inform her labs are negative for influenza. 

## 2015-10-31 NOTE — Progress Notes (Signed)
Subjective:  Patient ID: Andrea [name], female    DOB: 19-Jun-1950  Age: 65 y.o. MRN: [id number]  CC: URI   HPI Edilia Ghuman is a 65 year old female with a history of Type 2 Diabetes Mellitus (A1c 13.0), Coronary artery disease who presents today complaining of a 5 day history of nausea and vomiting, she also had fevers at home Tmax of 104, body aches, dyspnea, exertion. Endorses postnasal drip, rhinorrhea but denies any sick contacts. Received her flu shot at her last visit about 2 weeks ago; has not used any over-the-counter medications. She has not had any vomiting today.  Past Medical History:  Diagnosis Date  . Coronary artery disease   . Diabetes mellitus without complication ([place])   . Fibromyalgia     Past Surgical History:  Procedure Laterality Date  . CORONARY ARTERY BYPASS GRAFT    . KNEE SURGERY       Outpatient Medications Prior to Visit  Medication Sig Dispense Refill  . albuterol (PROVENTIL HFA;VENTOLIN HFA) 108 (90 Base) MCG/ACT inhaler Inhale 2 puffs into the lungs every 6 (six) hours as needed for wheezing or shortness of breath. 1 Inhaler 2  . amitriptyline (ELAVIL) 25 MG tablet Take 1 tablet (25 mg total) by mouth at bedtime. 30 tablet 2  . aspirin EC 81 MG tablet Take 1 tablet (81 mg total) by mouth daily. 90 tablet 3  . atorvastatin (LIPITOR) 80 MG tablet Take 1 tablet (80 mg total) by mouth daily. 90 tablet 3  . Blood Glucose Monitoring Suppl (TRUE METRIX GO GLUCOSE METER) w/Device KIT 1 each by Does not apply route every 8 (eight) hours as needed. 1 kit 0  . carbamide peroxide (DEBROX) 6.5 % otic solution Place 5 drops into the right ear 2 (two) times daily. 15 mL 0  . fluconazole (DIFLUCAN) 150 MG tablet Take 1 tablet (150 mg total) by mouth daily. (Patient not taking: Reported on 10/30/2015) 1 tablet 0  . Fluticasone-Salmeterol (ADVAIR) 100-50 MCG/DOSE AEPB Inhale 1 puff into the lungs 2 (two) times daily. 1 each 3  . gabapentin (NEURONTIN) 100 MG  capsule Take 1 capsule (100 mg total) by mouth 3 (three) times daily as needed. 90 capsule 3  . glucose blood (TRUE METRIX BLOOD GLUCOSE TEST) test strip Use as instructed 100 each 12  . IBUPROFEN PO Take by mouth.    . Insulin Glargine (LANTUS SOLOSTAR) 100 UNIT/ML Solostar Pen Inject 20 Units into the skin daily at 10 pm. 2 pen 3  . Insulin Pen Needle (ULTICARE MICRO PEN NEEDLES) 32G X 4 MM MISC 1 applicator by Does not apply route at bedtime. 100 each 2  . lisinopril-hydrochlorothiazide (PRINZIDE,ZESTORETIC) 10-12.5 MG tablet Take 1 tablet by mouth daily. 90 tablet 3  . metFORMIN (GLUCOPHAGE) 1000 MG tablet Take 1 tablet (1,000 mg total) by mouth 2 (two) times daily. 60 tablet 3  . nicotine (NICODERM CQ) 21 mg/24hr patch Place 1 patch (21 mg total) onto the skin daily. 28 patch 0  . nystatin (MYCOSTATIN/NYSTOP) powder Apply topically 4 (four) times daily. 15 g 0  . oxyCODONE-acetaminophen (PERCOCET/ROXICET) 5-325 MG per tablet Take 1-2 tablets by mouth every 4 (four) hours as needed for pain. (Patient not taking: Reported on 10/16/2015) 15 tablet 0  . TRUEPLUS LANCETS 26G MISC 1 each by Does not apply route every 8 (eight) hours as needed. 100 each 12   No facility-administered medications prior to visit.     ROS Review of Systems  Constitutional: Positive  for fatigue and fever. Negative for activity change and appetite change.  HENT: Negative for sinus pressure and sore throat.   Respiratory: Negative for chest tightness, shortness of breath and wheezing.   Cardiovascular: Negative for chest pain and palpitations.  Gastrointestinal: Positive for nausea and vomiting. Negative for abdominal distention, abdominal pain and constipation.  Genitourinary: Negative.   Musculoskeletal: Positive for myalgias.  Psychiatric/Behavioral: Negative for behavioral problems and dysphoric mood.    Objective:  BP 121/73 (BP Location: Right Arm, Patient Position: Sitting, Cuff Size: Small)   Pulse 99    Temp 100.2 F (37.9 C) (Oral)   Resp 16   Wt 132 lb 6.4 oz (60.1 kg)   BMI 23.45 kg/m   BP/Weight 10/30/2015 10/16/2015 6/70/1410  Systolic BP 301 314 388  Diastolic BP 73 77 81  Wt. (Lbs) 132.4 139.4 -  BMI 23.45 24.69 -      Physical Exam  Constitutional: She is oriented to person, place, and time. She appears well-developed and well-nourished.  Ill-looking.  Cardiovascular: Normal rate, normal heart sounds and intact distal pulses.   No murmur heard. Pulmonary/Chest: Effort normal and breath sounds normal. She has no wheezes. She has no rales. She exhibits no tenderness.  Abdominal: Soft. Bowel sounds are normal. She exhibits no distension and no mass. There is no tenderness.  Musculoskeletal: Normal range of motion.  Neurological: She is alert and oriented to person, place, and time.    Lab Results  Component Value Date   HGBA1C 13.0 10/16/2015    Assessment & Plan:   1. Viral syndrome - Influenza A and B Ag, Immunoassay  2. Non-intractable vomiting with nausea, unspecified vomiting type - promethazine (PHENERGAN) 25 MG tablet; Take 1 tablet (25 mg total) by mouth every 8 (eight) hours as needed for nausea or vomiting.  Dispense: 20 tablet; Refill: 0  3. Diabetes mellitus without complication ([facility]) I7N of 13.0 - Glucose (CBG)   Meds ordered this encounter  Medications  . promethazine (PHENERGAN) 25 MG tablet    Sig: Take 1 tablet (25 mg total) by mouth every 8 (eight) hours as needed for nausea or vomiting.    Dispense:  20 tablet    Refill:  0    Follow-up: Return in about 2 weeks (around 11/13/2015) for follow up on viral syndrome with PCP.   Arnoldo Morale MD

## 2015-10-31 NOTE — Telephone Encounter (Signed)
Writer called patient and LVM requesting a return call to discuss lab results.

## 2015-11-01 ENCOUNTER — Telehealth: Payer: Self-pay | Admitting: Family Medicine

## 2015-11-01 NOTE — Telephone Encounter (Signed)
Pt. Returned call. Please f/u °

## 2015-11-05 NOTE — Telephone Encounter (Signed)
Writer returned patient's call, LVM requesting call back to discuss labs.

## 2015-11-07 ENCOUNTER — Telehealth: Payer: Self-pay | Admitting: Family Medicine

## 2015-11-07 NOTE — Telephone Encounter (Signed)
Patient called the office to speak with nurse because she received her message. Pt also wanted to inform PCP that she has been running a low fever since her last appointment (10/18). Please follow up. Can leave a detailed message on phone.

## 2015-11-11 ENCOUNTER — Telehealth: Payer: Self-pay

## 2015-11-11 ENCOUNTER — Telehealth: Payer: Self-pay | Admitting: Family Medicine

## 2015-11-11 NOTE — Telephone Encounter (Signed)
Pt called again requesting to speak to nurse about the concerns listed on 10/26 Please F/U thank you

## 2015-11-11 NOTE — Telephone Encounter (Signed)
See note from lab results.  Writer has attempted to call patient back many times.

## 2015-11-11 NOTE — Telephone Encounter (Signed)
-----   Message from Jaclyn ShaggyEnobong Amao, MD sent at 10/31/2015  1:26 PM EDT ----- Please inform her labs are negative for influenza.

## 2015-11-11 NOTE — Telephone Encounter (Signed)
Patient returned nurse phone call. °Please follow up. °

## 2015-11-11 NOTE — Telephone Encounter (Signed)
Writer called patient many times as well as patient calling back.  Writer LVM that her influenza swab came back negative.  Writer encouraged patient to call back and to see if the front desk might be able to be put through at clinic time so we can talk.  Apparently patient has an important question.

## 2015-11-14 MED FILL — ATORVASTATIN 80 MG TABLET: 80 | 30 days supply | Qty: 30 | Fill #1

## 2015-11-14 MED FILL — LISINOPRIL-HCTZ 10-12.5 MG: 10-12.5 | 30 days supply | Qty: 30 | Fill #1

## 2015-11-14 MED FILL — !LANTUS SOLOSTAR 100UNITS/M: 100 | 30 days supply | Qty: 6 | Fill #1

## 2015-11-14 MED FILL — ?METFORMIN HCL 1,000 MG TAB: 1000 | 30 days supply | Qty: 60 | Fill #1

## 2015-11-14 MED FILL — ?GABAPENTIN 100 MG CAPSULE: 100 | 30 days supply | Qty: 90 | Fill #1

## 2015-11-14 MED FILL — ADVAIR 100/50 DISKUS: 100-50 | 30 days supply | Qty: 28 | Fill #1

## 2015-11-14 MED FILL — VENTOLIN HFA 90 MCG INHALER: 108 (90 BAS | 20 days supply | Qty: 18 | Fill #1

## 2015-11-14 MED FILL — AMITRIPTYLINE HCL 25 MG TAB: 25 | 30 days supply | Qty: 30 | Fill #1

## 2015-11-15 ENCOUNTER — Ambulatory Visit: Payer: Self-pay | Attending: Family Medicine | Admitting: Family Medicine

## 2015-11-15 ENCOUNTER — Encounter: Payer: Self-pay | Admitting: Family Medicine

## 2015-11-15 VITALS — BP 119/71 | HR 80 | Temp 98.7°F | Ht 62.5 in | Wt 133.0 lb

## 2015-11-15 DIAGNOSIS — K219 Gastro-esophageal reflux disease without esophagitis: Secondary | ICD-10-CM | POA: Insufficient documentation

## 2015-11-15 DIAGNOSIS — Z79899 Other long term (current) drug therapy: Secondary | ICD-10-CM | POA: Insufficient documentation

## 2015-11-15 DIAGNOSIS — Z7984 Long term (current) use of oral hypoglycemic drugs: Secondary | ICD-10-CM | POA: Insufficient documentation

## 2015-11-15 DIAGNOSIS — Z794 Long term (current) use of insulin: Secondary | ICD-10-CM | POA: Insufficient documentation

## 2015-11-15 DIAGNOSIS — E119 Type 2 diabetes mellitus without complications: Secondary | ICD-10-CM | POA: Insufficient documentation

## 2015-11-15 DIAGNOSIS — Z9889 Other specified postprocedural states: Secondary | ICD-10-CM | POA: Insufficient documentation

## 2015-11-15 DIAGNOSIS — I10 Essential (primary) hypertension: Secondary | ICD-10-CM | POA: Insufficient documentation

## 2015-11-15 LAB — GLUCOSE, POCT (MANUAL RESULT ENTRY): POC Glucose: 268 mg/dl — AB (ref 70–99)

## 2015-11-15 MED ORDER — INSULIN GLARGINE 100 UNIT/ML SOLOSTAR PEN: 25.0000 [IU] | PEN_INJECTOR | Freq: Every day | SUBCUTANEOUS | 3 refills | Status: DC

## 2015-11-15 MED ORDER — OMEPRAZOLE 20 MG PO CPDR: 20.0000 mg | DELAYED_RELEASE_CAPSULE | Freq: Every day | ORAL | 3 refills | Status: DC

## 2015-11-15 MED FILL — ?OMEPRAZOLE DR 20 MG CAPSUL: 20 | 30 days supply | Qty: 30 | Fill #0

## 2015-11-15 NOTE — Progress Notes (Signed)
Subjective:  Patient ID: Andrea [name], female    DOB: 1950-11-19  Age: 65 y.o. MRN: [id number]  CC: Follow-up (virus); Sinusitis; Hypertension; and Diabetes   HPI Andrea [name] is a 65 year old female with a history of uncontrolled type 2 diabetes mellitus (A1c 13.0), hypertension who was treated for a viral syndrome associated with nausea and vomiting at her last office visit. Tests for influenza A and B came back negative and she informs me today she is doing a whole lot better.  Her blood sugars continue to be elevated with fasting sugars in the 200s. Today she complains of indigestion and "feeling of food sticking in her chest" but this goes down when she burps. Denies nausea, vomiting, abdominal pain, diarrhea or constipation.  Past Medical History:  Diagnosis Date  . Coronary artery disease   . Diabetes mellitus without complication ([place])   . Fibromyalgia     Past Surgical History:  Procedure Laterality Date  . CORONARY ARTERY BYPASS GRAFT    . KNEE SURGERY      Allergies  Allergen Reactions  . Naproxen   . Tramadol Hcl      Outpatient Medications Prior to Visit  Medication Sig Dispense Refill  . albuterol (PROVENTIL HFA;VENTOLIN HFA) 108 (90 Base) MCG/ACT inhaler Inhale 2 puffs into the lungs every 6 (six) hours as needed for wheezing or shortness of breath. 1 Inhaler 2  . amitriptyline (ELAVIL) 25 MG tablet Take 1 tablet (25 mg total) by mouth at bedtime. 30 tablet 2  . aspirin EC 81 MG tablet Take 1 tablet (81 mg total) by mouth daily. 90 tablet 3  . atorvastatin (LIPITOR) 80 MG tablet Take 1 tablet (80 mg total) by mouth daily. 90 tablet 3  . Blood Glucose Monitoring Suppl (TRUE METRIX GO GLUCOSE METER) w/Device KIT 1 each by Does not apply route every 8 (eight) hours as needed. 1 kit 0  . carbamide peroxide (DEBROX) 6.5 % otic solution Place 5 drops into the right ear 2 (two) times daily. 15 mL 0  . Fluticasone-Salmeterol (ADVAIR) 100-50 MCG/DOSE AEPB Inhale  1 puff into the lungs 2 (two) times daily. 1 each 3  . gabapentin (NEURONTIN) 100 MG capsule Take 1 capsule (100 mg total) by mouth 3 (three) times daily as needed. 90 capsule 3  . glucose blood (TRUE METRIX BLOOD GLUCOSE TEST) test strip Use as instructed 100 each 12  . IBUPROFEN PO Take by mouth.    . Insulin Pen Needle (ULTICARE MICRO PEN NEEDLES) 32G X 4 MM MISC 1 applicator by Does not apply route at bedtime. 100 each 2  . lisinopril-hydrochlorothiazide (PRINZIDE,ZESTORETIC) 10-12.5 MG tablet Take 1 tablet by mouth daily. 90 tablet 3  . metFORMIN (GLUCOPHAGE) 1000 MG tablet Take 1 tablet (1,000 mg total) by mouth 2 (two) times daily. 60 tablet 3  . TRUEPLUS LANCETS 26G MISC 1 each by Does not apply route every 8 (eight) hours as needed. 100 each 12  . Insulin Glargine (LANTUS SOLOSTAR) 100 UNIT/ML Solostar Pen Inject 20 Units into the skin daily at 10 pm. 2 pen 3  . nicotine (NICODERM CQ) 21 mg/24hr patch Place 1 patch (21 mg total) onto the skin daily. (Patient not taking: Reported on 11/15/2015) 28 patch 0  . promethazine (PHENERGAN) 25 MG tablet Take 1 tablet (25 mg total) by mouth every 8 (eight) hours as needed for nausea or vomiting. (Patient not taking: Reported on 11/15/2015) 20 tablet 0  . fluconazole (DIFLUCAN) 150 MG tablet Take 1 tablet (  150 mg total) by mouth daily. (Patient not taking: Reported on 10/30/2015) 1 tablet 0  . nystatin (MYCOSTATIN/NYSTOP) powder Apply topically 4 (four) times daily. 15 g 0  . oxyCODONE-acetaminophen (PERCOCET/ROXICET) 5-325 MG per tablet Take 1-2 tablets by mouth every 4 (four) hours as needed for pain. (Patient not taking: Reported on 10/16/2015) 15 tablet 0   No facility-administered medications prior to visit.     ROS Review of Systems  Constitutional: Negative for activity change and appetite change.  HENT: Negative for sinus pressure and sore throat.   Respiratory: Negative for chest tightness, shortness of breath and wheezing.     Cardiovascular: Negative for chest pain and palpitations.  Gastrointestinal: Negative for abdominal distention, abdominal pain and constipation.  Genitourinary: Negative.   Musculoskeletal: Negative.   Psychiatric/Behavioral: Negative for behavioral problems and dysphoric mood.    Objective:  BP 119/71 (BP Location: Right Arm, Patient Position: Sitting, Cuff Size: Large)   Pulse 80   Temp 98.7 F (37.1 C) (Oral)   Ht 5' 2.5" (1.588 m)   Wt 133 lb (60.3 kg)   SpO2 98%   BMI 23.94 kg/m   BP/Weight 11/15/2015 10/30/2015 98/09/2117  Systolic BP 417 408 144  Diastolic BP 71 73 77  Wt. (Lbs) 133 132.4 139.4  BMI 23.94 23.45 24.69     Physical Exam  Constitutional: She is oriented to person, place, and time. She appears well-developed and well-nourished.  Cardiovascular: Normal rate, normal heart sounds and intact distal pulses.   No murmur heard. Pulmonary/Chest: Effort normal and breath sounds normal. She has no wheezes. She has no rales. She exhibits no tenderness.  Abdominal: Soft. Bowel sounds are normal. She exhibits no distension and no mass. There is no tenderness.  Musculoskeletal: Normal range of motion.  Neurological: She is alert and oriented to person, place, and time.     Lab Results  Component Value Date   HGBA1C 13.0 10/16/2015    Assessment & Plan:   1. Diabetes mellitus without complication ([place]) Uncontrolled with A1c of 13.0 Increase Lantus 25 units at bedtime Diabetic diet, lifestyle modifications Follow-up with PCP to review blood sugar log - Glucose (CBG) - Insulin Glargine (LANTUS SOLOSTAR) 100 UNIT/ML Solostar Pen; Inject 25 Units into the skin daily at 10 pm.  Dispense: 2 pen; Refill: 3  2. Gastroesophageal reflux disease without esophagitis Avoid late meals and avoid recumbent positions up to 2 hours after meals. - omeprazole (PRILOSEC) 20 MG capsule; Take 1 capsule (20 mg total) by mouth daily.  Dispense: 30 capsule; Refill: 3   Meds  ordered this encounter  Medications  . Insulin Glargine (LANTUS SOLOSTAR) 100 UNIT/ML Solostar Pen    Sig: Inject 25 Units into the skin daily at 10 pm.    Dispense:  2 pen    Refill:  3    Discontinue previous dose  . omeprazole (PRILOSEC) 20 MG capsule    Sig: Take 1 capsule (20 mg total) by mouth daily.    Dispense:  30 capsule    Refill:  3    Follow-up: Return in about 1 month (around 12/15/2015) for Follow-up of diabetes mellitus with Dr. Janne Napoleon.   Arnoldo Morale MD

## 2015-12-17 ENCOUNTER — Encounter: Payer: Self-pay | Admitting: Internal Medicine

## 2015-12-17 ENCOUNTER — Ambulatory Visit: Payer: Self-pay | Attending: Internal Medicine | Admitting: Internal Medicine

## 2015-12-17 VITALS — BP 145/80 | HR 50 | Temp 98.4°F | Resp 16 | Wt 136.2 lb

## 2015-12-17 DIAGNOSIS — Z7982 Long term (current) use of aspirin: Secondary | ICD-10-CM | POA: Insufficient documentation

## 2015-12-17 DIAGNOSIS — Z1231 Encounter for screening mammogram for malignant neoplasm of breast: Secondary | ICD-10-CM

## 2015-12-17 DIAGNOSIS — J441 Chronic obstructive pulmonary disease with (acute) exacerbation: Secondary | ICD-10-CM | POA: Insufficient documentation

## 2015-12-17 DIAGNOSIS — R059 Cough, unspecified: Secondary | ICD-10-CM

## 2015-12-17 DIAGNOSIS — E1140 Type 2 diabetes mellitus with diabetic neuropathy, unspecified: Secondary | ICD-10-CM | POA: Insufficient documentation

## 2015-12-17 DIAGNOSIS — R05 Cough: Secondary | ICD-10-CM | POA: Insufficient documentation

## 2015-12-17 DIAGNOSIS — E1142 Type 2 diabetes mellitus with diabetic polyneuropathy: Secondary | ICD-10-CM

## 2015-12-17 DIAGNOSIS — Z1239 Encounter for other screening for malignant neoplasm of breast: Secondary | ICD-10-CM

## 2015-12-17 DIAGNOSIS — Z79899 Other long term (current) drug therapy: Secondary | ICD-10-CM | POA: Insufficient documentation

## 2015-12-17 DIAGNOSIS — Z794 Long term (current) use of insulin: Secondary | ICD-10-CM | POA: Insufficient documentation

## 2015-12-17 DIAGNOSIS — I10 Essential (primary) hypertension: Secondary | ICD-10-CM | POA: Insufficient documentation

## 2015-12-17 DIAGNOSIS — Z5189 Encounter for other specified aftercare: Secondary | ICD-10-CM | POA: Insufficient documentation

## 2015-12-17 LAB — POCT GLYCOSYLATED HEMOGLOBIN (HGB A1C): Hemoglobin A1C: 9.9

## 2015-12-17 LAB — GLUCOSE, POCT (MANUAL RESULT ENTRY): POC Glucose: 154 mg/dl — AB (ref 70–99)

## 2015-12-17 MED ORDER — LISINOPRIL 10 MG PO TABS: 10.0000 mg | ORAL_TABLET | Freq: Every day | ORAL | 3 refills | Status: DC

## 2015-12-17 MED ORDER — FLUTICASONE-SALMETEROL 100-50 MCG/DOSE IN AEPB: 1.0000 | INHALATION_SPRAY | Freq: Two times a day (BID) | RESPIRATORY_TRACT | 3 refills | Status: DC

## 2015-12-17 MED ORDER — AZITHROMYCIN 250 MG PO TABS: ORAL_TABLET | ORAL | 0 refills | Status: DC

## 2015-12-17 MED ORDER — BENZONATATE 100 MG PO CAPS: 100.0000 mg | ORAL_CAPSULE | Freq: Three times a day (TID) | ORAL | 0 refills | Status: DC | PRN

## 2015-12-17 MED ORDER — ALBUTEROL SULFATE HFA 108 (90 BASE) MCG/ACT IN AERS: 2.0000 | INHALATION_SPRAY | Freq: Four times a day (QID) | RESPIRATORY_TRACT | 2 refills | Status: DC | PRN

## 2015-12-17 MED ORDER — INSULIN PEN NEEDLE 32G X 4 MM MISC: 1.0000 | Freq: Every day | 11 refills | Status: DC

## 2015-12-17 MED ORDER — METFORMIN HCL 1000 MG PO TABS: 1000.0000 mg | ORAL_TABLET | Freq: Two times a day (BID) | ORAL | 3 refills | Status: DC

## 2015-12-17 MED ORDER — HYDROCHLOROTHIAZIDE 25 MG PO TABS: 25.0000 mg | ORAL_TABLET | Freq: Every day | ORAL | 3 refills | Status: DC

## 2015-12-17 MED ORDER — PREDNISONE 20 MG PO TABS: 60.0000 mg | ORAL_TABLET | Freq: Every day | ORAL | 0 refills | Status: DC

## 2015-12-17 MED ORDER — INSULIN GLARGINE 100 UNIT/ML SOLOSTAR PEN: 26.0000 [IU] | PEN_INJECTOR | Freq: Every day | SUBCUTANEOUS | 3 refills | Status: DC

## 2015-12-17 MED ORDER — GUAIFENESIN-CODEINE 100-10 MG/5ML PO SOLN: 5.0000 mL | Freq: Four times a day (QID) | ORAL | 0 refills | Status: DC | PRN

## 2015-12-17 MED ORDER — GABAPENTIN 300 MG PO CAPS: 300.0000 mg | ORAL_CAPSULE | Freq: Three times a day (TID) | ORAL | 3 refills | Status: DC

## 2015-12-17 MED FILL — ADVAIR 100/50 DISKUS: 100-50 | 30 days supply | Qty: 60 | Fill #0

## 2015-12-17 MED FILL — VENTOLIN HFA 90 MCG INHALER: 108 (90 BAS | 25 days supply | Qty: 18 | Fill #0

## 2015-12-17 MED FILL — ?LISINOPRIL 10 MG TABLET: 10 | 30 days supply | Qty: 30 | Fill #0

## 2015-12-17 MED FILL — BENZONATATE 100 MG CAPSULE: 100 | 10 days supply | Qty: 30 | Fill #0

## 2015-12-17 MED FILL — ULTICARE PEN NDL 4MM 32G: 32G X 4 MM | 30 days supply | Qty: 100 | Fill #0

## 2015-12-17 MED FILL — !LANTUS SOLOSTAR 100UNITS/M: 100 | 30 days supply | Qty: 6 | Fill #2

## 2015-12-17 MED FILL — GABAPENTIN 300 MG CAPSULE: 300 | 30 days supply | Qty: 90 | Fill #0

## 2015-12-17 MED FILL — HYDROCHLOROTHIAZIDE 25 MG T: 25 | 30 days supply | Qty: 30 | Fill #0

## 2015-12-17 MED FILL — predniSONE 20 MG TABS: 20 | 10 days supply | Qty: 20 | Fill #0

## 2015-12-17 MED FILL — ?AZITHROMYCIN 250 MG TABLET: 250 | 5 days supply | Qty: 6 | Fill #0

## 2015-12-17 MED FILL — GUAIFENESIN AC COUGH SYRUP: 100-10 | 10 days supply | Qty: 120 | Fill #0

## 2015-12-17 MED FILL — metFORMIN HCL 1000 MG TABS: 1000 | 30 days supply | Qty: 60 | Fill #0

## 2015-12-17 NOTE — Patient Instructions (Signed)
- [facility] 2 wks /dm chk.   Check blood sugars on waking up daily., and at least 1-2 times rest of day.    Also check blood sugars about 2 hours after a meal and do this after different meals by rotation  Recommended blood sugar levels on waking up is 90-130 and about 2 hours after meal is 130-160  Please bring your blood sugar monitor to each visit, thank you  Restart exercise  Continue cholesterol med.   - Lantus Titration  Instructions:  IF AM blood sugar is  >150, increase lantus by 1 unit at night.  Continue to check sugars daily. On day 3, if blood sugar >150, continue to increase lantus by 1 unit.  If blood sugar < 70s, than need to reduce dose by 1 unit nightly.  Continue to check blood sugars in am daily and adjust per above.  -- please call us if you have any questions    Chronic Obstructive Pulmonary Disease Exacerbation Chronic obstructive pulmonary disease (COPD) is a common lung problem. In COPD, the flow of air from the lungs is limited. COPD exacerbations are times that breathing gets worse and you need extra treatment. Without treatment they can be life threatening. If they happen often, your lungs can become more damaged. If your COPD gets worse, your doctor may treat you with:  Medicines.  Oxygen.  Different ways to clear your airway, such as using a mask. Follow these instructions at home:  Do not smoke.  Avoid tobacco smoke and other things that bother your lungs.  If given, take your antibiotic medicine as told. Finish the medicine even if you start to feel better.  Only take medicines as told by your doctor.  Drink enough fluids to keep your pee (urine) clear or pale yellow (unless your doctor has told you not to).  Use a cool mist machine (vaporizer).  If you use oxygen or a machine that turns liquid medicine into a mist (nebulizer), continue to use them as told.  Keep up with shots (vaccinations) as told by your  doctor.  Exercise regularly.  Eat healthy foods.  Keep all doctor visits as told. Get help right away if:  You are very short of breath and it gets worse.  You have trouble talking.  You have bad chest pain.  You have blood in your spit (sputum).  You have a fever.  You keep throwing up (vomiting).  You feel weak, or you pass out (faint).  You feel confused.  You keep getting worse. This information is not intended to replace advice given to you by your health care provider. Make sure you discuss any questions you have with your health care provider. Document Released: 12/18/2010 Document Revised: 06/06/2015 Document Reviewed: 09/02/2012 Elsevier Interactive Patient Education  2017 [employer]   -  Steps to Quit Smoking Smoking tobacco can be bad for your health. It can also affect almost every organ in your body. Smoking puts you and people around you at risk for many serious long-lasting (chronic) diseases. Quitting smoking is hard, but it is one of the best things that you can do for your health. It is never too late to quit. What are the benefits of quitting smoking? When you quit smoking, you lower your risk for getting serious diseases and conditions. They can include:  Lung cancer or lung disease.  Heart disease.  Stroke.  Heart attack.  Not being able to have children (infertility).  Weak bones (  osteoporosis) and broken bones (fractures). If you have coughing, wheezing, and shortness of breath, those symptoms may get better when you quit. You may also get sick less often. If you are pregnant, quitting smoking can help to lower your chances of having a baby of low birth weight. What can I do to help me quit smoking? Talk with your doctor about what can help you quit smoking. Some things you can do (strategies) include:  Quitting smoking totally, instead of slowly cutting back how much you smoke over a period of time.  Going to in-person counseling. You  are more likely to quit if you go to many counseling sessions.  Using resources and support systems, such as:  Online chats with a Veterinary surgeoncounselor.  Phone quitlines.  Printed Materials engineerself-help materials.  Support groups or group counseling.  Text messaging programs.  Mobile phone apps or applications.  Taking medicines. Some of these medicines may have nicotine in them. If you are pregnant or breastfeeding, do not take any medicines to quit smoking unless your doctor says it is okay. Talk with your doctor about counseling or other things that can help you. Talk with your doctor about using more than one strategy at the same time, such as taking medicines while you are also going to in-person counseling. This can help make quitting easier. What things can I do to make it easier to quit? Quitting smoking might feel very hard at first, but there is a lot that you can do to make it easier. Take these steps:  Talk to your family and friends. Ask them to support and encourage you.  Call phone quitlines, reach out to support groups, or work with a Veterinary surgeoncounselor.  Ask people who smoke to not smoke around you.  Avoid places that make you want (trigger) to smoke, such as:  Bars.  Parties.  Smoke-break areas at work.  Spend time with people who do not smoke.  Lower the stress in your life. Stress can make you want to smoke. Try these things to help your stress:  Getting regular exercise.  Deep-breathing exercises.  Yoga.  Meditating.  Doing a body scan. To do this, close your eyes, focus on one area of your body at a time from head to toe, and notice which parts of your body are tense. Try to relax the muscles in those areas.  Download or buy apps on your mobile phone or tablet that can help you stick to your quit plan. There are many free apps, such as QuitGuide from the [employer] developer(Centers for Disease Control and Prevention). You can find more support from smokefree.gov and other websites. This  information is not intended to replace advice given to you by your health care provider. Make sure you discuss any questions you have with your health care provider. Document Released: 10/25/2008 Document Revised: 08/27/2015 Document Reviewed: 05/15/2014 Elsevier Interactive Patient Education  2017 [employer]

## 2015-12-17 NOTE — Progress Notes (Signed)
Andrea [name], is a 65 y.o. female  VQX:[id number]  MKL:[id number]  DOB - [birth year]  Chief Complaint  Patient presents with  . Diabetes        Subjective:   Andrea [name] is a 65 y.o. female here today for a follow up visit, recently seen in clinic 11/3 for viral syndrome, neg flu a & b. Of note, she is still having progressive cough the last 3 wks, productive of white phlegm, no f/c, but increasing cough at night, difficult to sleep.  She has been smoking for years, last cig was last night. Has had to use her albuterol rescue recently at least 2-3 x at night.   Patient has No headache, No chest pain, No abdominal pain - No Nausea, No new weakness tingling or numbness,  No problems updated.  ALLERGIES: Allergies  Allergen Reactions  . Naproxen   . Tramadol Hcl     PAST MEDICAL HISTORY: Past Medical History:  Diagnosis Date  . Coronary artery disease   . Diabetes mellitus without complication ([place])   . Fibromyalgia     MEDICATIONS AT HOME: Prior to Admission medications   Medication Sig Start Date End Date Taking? Authorizing Provider  albuterol (PROVENTIL HFA;VENTOLIN HFA) 108 (90 Base) MCG/ACT inhaler Inhale 2 puffs into the lungs every 6 (six) hours as needed for wheezing or shortness of breath. 12/17/15   Maren Reamer, MD  amitriptyline (ELAVIL) 25 MG tablet Take 1 tablet (25 mg total) by mouth at bedtime. 10/16/15   Maren Reamer, MD  aspirin EC 81 MG tablet Take 1 tablet (81 mg total) by mouth daily. 10/16/15   Maren Reamer, MD  atorvastatin (LIPITOR) 80 MG tablet Take 1 tablet (80 mg total) by mouth daily. 10/17/15   Maren Reamer, MD  azithromycin (ZITHROMAX) 250 MG tablet Day 1 take 2 tabs, day 2- 4 take 1 tab, than stop 12/17/15   Maren Reamer, MD  benzonatate (TESSALON PERLES) 100 MG capsule Take 1 capsule (100 mg total) by mouth 3 (three) times daily as needed for cough. 12/17/15   Maren Reamer, MD  Blood Glucose Monitoring Suppl (TRUE  METRIX GO GLUCOSE METER) w/Device KIT 1 each by Does not apply route every 8 (eight) hours as needed. 10/16/15   Maren Reamer, MD  carbamide peroxide (DEBROX) 6.5 % otic solution Place 5 drops into the right ear 2 (two) times daily. 10/16/15   Maren Reamer, MD  Fluticasone-Salmeterol (ADVAIR) 100-50 MCG/DOSE AEPB Inhale 1 puff into the lungs 2 (two) times daily. 12/17/15   Maren Reamer, MD  gabapentin (NEURONTIN) 300 MG capsule Take 1 capsule (300 mg total) by mouth 3 (three) times daily. 12/17/15   Maren Reamer, MD  glucose blood (TRUE METRIX BLOOD GLUCOSE TEST) test strip Use as instructed 10/16/15   Maren Reamer, MD  guaiFENesin-codeine 100-10 MG/5ML syrup Take 5 mLs by mouth every 6 (six) hours as needed for cough. 12/17/15   Maren Reamer, MD  hydrochlorothiazide (HYDRODIURIL) 25 MG tablet Take 1 tablet (25 mg total) by mouth daily. 12/17/15   Maren Reamer, MD  IBUPROFEN PO Take by mouth.    Historical Provider, MD  Insulin Glargine (LANTUS SOLOSTAR) 100 UNIT/ML Solostar Pen Inject 26 Units into the skin daily at 10 pm. 12/17/15   Maren Reamer, MD  Insulin Pen Needle (ULTICARE MICRO PEN NEEDLES) 32G X 4 MM MISC 1 applicator by Does not apply route at bedtime. 12/17/15  Maren Reamer, MD  lisinopril (PRINIVIL,ZESTRIL) 10 MG tablet Take 1 tablet (10 mg total) by mouth daily. 12/17/15   Maren Reamer, MD  metFORMIN (GLUCOPHAGE) 1000 MG tablet Take 1 tablet (1,000 mg total) by mouth 2 (two) times daily. 12/17/15   Maren Reamer, MD  nicotine (NICODERM CQ) 21 mg/24hr patch Place 1 patch (21 mg total) onto the skin daily. Patient not taking: Reported on 11/15/2015 10/16/15   Maren Reamer, MD  omeprazole (PRILOSEC) 20 MG capsule Take 1 capsule (20 mg total) by mouth daily. 11/15/15   Arnoldo Morale, MD  predniSONE (DELTASONE) 20 MG tablet Take 3 tablets (60 mg total) by mouth daily with breakfast. Taper: Day 1-3, take 18m (=3 tabs) qam, day 4-7 take 416mqam (=2 tabs), day  8-10 take 1 tab daily, than stop 12/17/15   DaMaren ReamerMD  promethazine (PHENERGAN) 25 MG tablet Take 1 tablet (25 mg total) by mouth every 8 (eight) hours as needed for nausea or vomiting. Patient not taking: Reported on 11/15/2015 10/30/15   EnArnoldo MoraleMD  TRUEPLUS LANCETS 26G MISC 1 each by Does not apply route every 8 (eight) hours as needed. 10/16/15   DaMaren ReamerMD     Objective:   Vitals:   12/17/15 1551  BP: (!) 145/80  Pulse: (!) 50  Resp: 16  Temp: 98.4 F (36.9 C)  TempSrc: Oral  SpO2: 95%  Weight: 136 lb 3.2 oz (61.8 kg)    Exam General appearance : Awake, alert, not in any distress. Speech Clear. Not toxic looking,, pleasant. HEENT: Atraumatic and Normocephalic, pupils equally reactive to light. Neck: supple, no JVD.  Chest: diffusely diminished bs w/ exp wheezing intermittently. No c/r. Post duonebs, better aeration, no wheezing noted. CVS: S1 S2 regular, no murmurs/gallups or rubs. Abdomen: Bowel sounds active, Non tender and not distended with no gaurding, rigidity or rebound. Foot exam: bilateral peripheral pulses 2+ (dorsalis pedis and post tibialis pulses), no ulcers noted/no ecchymosis, warm to touch, monofilament testing 1/3 bilat. Sensation intact.  No c/c/e.  Neurology: Awake alert, and oriented X 3, CN II-XII grossly intact, Non focal Skin:No Rash  Data Review Lab Results  Component Value Date   HGBA1C 9.9 12/17/2015   HGBA1C 13.0 10/16/2015    [facility] 12/17/2015 11/15/2015 10/30/2015 10/16/2015  Decreased Interest 1 3 3 3   Down, Depressed, Hopeless 3 3 3 3   PHQ - 2 Score 4 6 6 6   Altered sleeping 3 3 3 3   Tired, decreased energy 3 3 3 3   Change in appetite 3 3 3 3   Feeling bad or failure about yourself  0 2 3 3   Trouble concentrating 0 2 3 3   Moving slowly or fidgety/restless 0 0 3 3  Suicidal thoughts 0 2 0 0  PHQ-9 Score 13 21 24 24       Assessment & Plan   1. Diabetes mellitus with neuropathy Remains  ooc, but better - dw pt self-titration of lantus q 3 days, instructions provided, and pt able to repeat the instructions to me. - POCT glucose (manual entry) 154 - POCT glycosylated hemoglobin (Hb A1C) 9.9 (from 13) - Insulin Glargine (LANTUS SOLOSTAR) 100 UNIT/ML Solostar Pen; Inject 26 Units into the skin daily at 10 pm.  Dispense: 4 pen; Refill: 3 - dm f/u w/ St[facility]ineharm in 2 wks. - needs eye exam, pt states due at end of this year, and will see optometrist - increase neurontin to 300tid -  renewed metformin 1074m bid  2. COPD exacerbation (HOpal, w/ prod cough - duonebs x 1, w/ improvement - PF prior to nebs 50, post PF 150s. - DG Chest 2 View; Future - zpack - short steroid taper - prn robitussin & codeine, advised to not drive w/ it, prn tessalon pearls - advised to stop smoking.  3. hypertension - uncontrolled, goal <130/80 - dc prinzide - start hctz 25 qd - start lisinopril 10 qd  4. Breast cancer screening - MM Digital Screening; Future - scholarship program  5. Financial aid      Patient have been counseled extensively about nutrition and exercise  Return in about 4 weeks (around 01/14/2016) for copd.  The patient was given clear instructions to go to ER or return to medical center if symptoms don't improve, worsen or new problems develop. The patient verbalized understanding. The patient was told to call to get lab results if they haven't heard anything in the next week.   This note has been created with DSurveyor, quantity Any transcriptional errors are unintentional.   DMaren Reamer MD, MHawaiian Paradise Parkand W[facility] HospitalGNemac[facility] NMentor  12/17/2015, 4:34 PM

## 2015-12-26 ENCOUNTER — Other Ambulatory Visit: Payer: Self-pay

## 2015-12-26 DIAGNOSIS — E1142 Type 2 diabetes mellitus with diabetic polyneuropathy: Secondary | ICD-10-CM

## 2015-12-26 MED ORDER — FLUTICASONE-SALMETEROL 100-50 MCG/DOSE IN AEPB: 1.0000 | INHALATION_SPRAY | Freq: Two times a day (BID) | RESPIRATORY_TRACT | 3 refills | Status: DC

## 2015-12-26 MED ORDER — INSULIN GLARGINE 100 UNIT/ML SOLOSTAR PEN: 25.0000 [IU] | PEN_INJECTOR | Freq: Every day | SUBCUTANEOUS | 3 refills | Status: DC

## 2015-12-26 MED ORDER — ALBUTEROL SULFATE HFA 108 (90 BASE) MCG/ACT IN AERS: 2.0000 | INHALATION_SPRAY | Freq: Four times a day (QID) | RESPIRATORY_TRACT | 3 refills | Status: DC | PRN

## 2015-12-31 ENCOUNTER — Ambulatory Visit: Payer: Self-pay | Admitting: Pharmacist

## 2015-12-31 NOTE — Progress Notes (Deleted)
    S:    Patient arrives ***.  Presents for diabetes evaluation, education, and management at the request of ***. Patient was referred on ***.  Patient was last seen by Primary Care Provider on ***.   Patient reports Diabetes was diagnosed in ***.   Patient {Actions; denies-reports:120008} adherence with medications.  Current diabetes medications include: Lantus 25 units daily and metformin 1000 mg BID. Current hypertension medications include:   Patient {Actions; denies-reports:120008} hypoglycemic events.  Patient reported dietary habits: Eats *** meals/day Breakfast:*** Lunch:*** Dinner:*** Snacks:*** Drinks:***  Patient reported exercise habits:    Patient {Actions; denies-reports:120008} nocturia.  Patient {Actions; denies-reports:120008} neuropathy. Patient {Actions; denies-reports:120008} visual changes. Patient {Actions; denies-reports:120008} self foot exams.   O:  Lab Results  Component Value Date   HGBA1C 9.9 12/17/2015   There were no vitals filed for this visit.  Home fasting CBG: ***  2 hour post-prandial/random CBG: ***.  10 year ASCVD risk: ***.  A/P: Diabetes longstanding currently UNcontrolled based on A1c of 9.9. Patient {Actions; denies-reports:120008} hypoglycemic events and is able to verbalize appropriate hypoglycemia management plan. Patient {Actions; denies-reports:120008} adherence with medication. Control is suboptimal due to ***.  Next A1C anticipated March 2018.    Written patient instructions provided.  Total time in face to face counseling *** minutes.   Follow up in Pharmacist Clinic Visit ***.

## 2016-01-01 ENCOUNTER — Other Ambulatory Visit: Payer: Self-pay | Admitting: Internal Medicine

## 2016-01-14 ENCOUNTER — Encounter: Payer: Self-pay | Admitting: Family Medicine

## 2016-01-14 ENCOUNTER — Ambulatory Visit: Payer: BLUE CROSS/BLUE SHIELD | Attending: Family Medicine | Admitting: Family Medicine

## 2016-01-14 VITALS — BP 163/62 | HR 72 | Temp 98.2°F | Ht 62.0 in | Wt 145.2 lb

## 2016-01-14 DIAGNOSIS — Z794 Long term (current) use of insulin: Secondary | ICD-10-CM | POA: Insufficient documentation

## 2016-01-14 DIAGNOSIS — R600 Localized edema: Secondary | ICD-10-CM | POA: Diagnosis not present

## 2016-01-14 DIAGNOSIS — E119 Type 2 diabetes mellitus without complications: Secondary | ICD-10-CM | POA: Insufficient documentation

## 2016-01-14 DIAGNOSIS — I10 Essential (primary) hypertension: Secondary | ICD-10-CM

## 2016-01-14 DIAGNOSIS — M25579 Pain in unspecified ankle and joints of unspecified foot: Secondary | ICD-10-CM | POA: Diagnosis not present

## 2016-01-14 DIAGNOSIS — Z7982 Long term (current) use of aspirin: Secondary | ICD-10-CM | POA: Insufficient documentation

## 2016-01-14 DIAGNOSIS — N3281 Overactive bladder: Secondary | ICD-10-CM | POA: Diagnosis not present

## 2016-01-14 DIAGNOSIS — Z79899 Other long term (current) drug therapy: Secondary | ICD-10-CM | POA: Insufficient documentation

## 2016-01-14 LAB — GLUCOSE, POCT (MANUAL RESULT ENTRY): POC GLUCOSE: 228 mg/dL — AB (ref 70–99)

## 2016-01-14 MED ORDER — HYDROCHLOROTHIAZIDE 25 MG PO TABS: 25.0000 mg | ORAL_TABLET | Freq: Every day | ORAL | 3 refills | Status: DC

## 2016-01-14 MED ORDER — CEPHALEXIN 500 MG PO CAPS: 500.0000 mg | ORAL_CAPSULE | Freq: Two times a day (BID) | ORAL | 1 refills | Status: DC

## 2016-01-14 MED ORDER — OXYBUTYNIN CHLORIDE 5 MG PO TABS: 5.0000 mg | ORAL_TABLET | Freq: Two times a day (BID) | ORAL | 2 refills | Status: DC

## 2016-01-14 MED FILL — CEPHALEXIN 500 MG CAPSULE: 500 | 10 days supply | Qty: 20 | Fill #0

## 2016-01-14 MED FILL — HYDROCHLOROTHIAZIDE 25 MG T: 25 | 30 days supply | Qty: 30 | Fill #0

## 2016-01-14 MED FILL — OXYBUTYNIN 5 MG TABLET: 5 | 30 days supply | Qty: 60 | Fill #0

## 2016-01-14 MED FILL — !LANTUS SOLOSTAR 100UNITS/M: 100 | 30 days supply | Qty: 9 | Fill #0

## 2016-01-14 NOTE — Patient Instructions (Signed)
Overactive Bladder, Adult Introduction Overactive bladder is a group of urinary symptoms. With overactive bladder, you may suddenly feel the need to pass urine (urinate) right away. After feeling this sudden urge, you might also leak urine if you cannot get to the bathroom fast enough (urinary incontinence). These symptoms might interfere with your daily work or social activities. Overactive bladder symptoms may also wake you up at night. Overactive bladder affects the nerve signals between your bladder and your brain. Your bladder may get the signal to empty before it is full. Very sensitive muscles can also make your bladder squeeze too soon. What are the causes? Many things can cause an overactive bladder. Possible causes include:  Urinary tract infection.  Infection of nearby tissues, such as the prostate.  Prostate enlargement.  Being pregnant with twins or more (multiples).  Surgery on the uterus or urethra.  Bladder stones, inflammation, or tumors.  Drinking too much caffeine or alcohol.  Certain medicines, especially those that you take to help your body get rid of extra fluid (diuretics) by increasing urine production.  Muscle or nerve weakness, especially from:  A spinal cord injury.  Stroke.  Multiple sclerosis.  Parkinson disease.  Diabetes. This can cause a high urine volume that fills the bladder so quickly that the normal urge to urinate is triggered very strongly.  Constipation. A buildup of too much stool can put pressure on your bladder. What increases the risk? You may be at greater risk for overactive bladder if you:  Are an older adult.  Smoke.  Are going through menopause.  Have prostate problems.  Have a neurological disease, such as stroke, dementia, Parkinson disease, or multiple sclerosis (MS).  Eat or drink things that irritate the bladder. These include alcohol, spicy food, and caffeine.  Are overweight or obese. What are the signs or  symptoms? The signs and symptoms of an overactive bladder include:  Sudden, strong urges to urinate.  Leaking urine.  Urinating eight or more times per day.  Waking up to urinate two or more times per night. How is this diagnosed? Your health care provider may suspect overactive bladder based on your symptoms. The health care provider will do a physical exam and take your medical history. Blood or urine tests may also be done. For example, you might need to have a bladder function test to check how well you can hold your urine. You might also need to see a health care provider who specializes in the urinary tract (urologist). How is this treated? Treatment for overactive bladder depends on the cause of your condition and whether it is mild or severe. Certain treatments can be done in your health care provider's office or clinic. You can also make lifestyle changes at home. Options include: Behavioral Treatments  Biofeedback. A specialist uses sensors to help you become aware of your body's signals.  Keeping a daily log of when you need to urinate and what happens after the urge. This may help you manage your condition.  Bladder training. This helps you learn to control the urge to urinate by following a schedule that directs you to urinate at regular intervals (timed voiding). At first, you might have to wait a few minutes after feeling the urge. In time, you should be able to schedule bathroom visits an hour or more apart.  Kegel exercises. These are exercises to strengthen the pelvic floor muscles, which support the bladder. Toning these muscles can help you control urination, even if your bladder muscles   are overactive. A specialist will teach you how to do these exercises correctly. They require daily practice.  Weight loss. If you are obese or overweight, losing weight might relieve your symptoms of overactive bladder. Talk to your health care provider about losing weight and whether  there is a specific program or method that would work best for you.  Diet change. This might help if constipation is making your overactive bladder worse. Your health care provider or a dietitian can explain ways to change what you eat to ease constipation. You might also need to consume less alcohol and caffeine or drink other fluids at different times of the day.  Stopping smoking.  Wearing pads to absorb leakage while you wait for other treatments to take effect. Physical Treatments  Electrical stimulation. Electrodes send gentle pulses of electricity to strengthen the nerves or muscles that help to control the bladder. Sometimes, the electrodes are placed outside of the body. In other cases, they might be placed inside the body (implanted). This treatment can take several months to have an effect.  Supportive devices. Women may need a plastic device that fits into the vagina and supports the bladder (pessary). Medicines  Several medicines can help treat overactive bladder and are usually used along with other treatments. Some are injected into the muscles involved in urination. Others come in pill form. Your health care provider may prescribe:  Antispasmodics. These medicines block the signals that the nerves send to the bladder. This keeps the bladder from releasing urine at the wrong time.  Tricyclic antidepressants. These types of antidepressants also relax bladder muscles. Surgery  You may have a device implanted to help manage the nerve signals that indicate when you need to urinate.  You may have surgery to implant electrodes for electrical stimulation.  Sometimes, very severe cases of overactive bladder require surgery to change the shape of the bladder. Follow these instructions at home:  Take medicines only as directed by your health care provider.  Use any implants or a pessary as directed by your health care provider.  Make any diet or lifestyle changes that are  recommended by your health care provider. These might include:  Drinking less fluid or drinking at different times of the day. If you need to urinate often during the night, you may need to stop drinking fluids early in the evening.  Cutting down on caffeine or alcohol. Both can make an overactive bladder worse. Caffeine is found in coffee, tea, and sodas.  Doing Kegel exercises to strengthen muscles.  Losing weight if you need to.  Eating a healthy and balanced diet to prevent constipation.  Keep a journal or log to track how much and when you drink and also when you feel the need to urinate. This will help your health care provider to monitor your condition. Contact a health care provider if:  Your symptoms do not get better after treatment.  Your pain and discomfort are getting worse.  You have more frequent urges to urinate.  You have a fever. Get help right away if: You are not able to control your bladder at all. This information is not intended to replace advice given to you by your health care provider. Make sure you discuss any questions you have with your health care provider. Document Released: 10/25/2008 Document Revised: 06/06/2015 Document Reviewed: 05/24/2013  2017 [employer]  

## 2016-01-14 NOTE — Progress Notes (Signed)
Didn't bring meds with her- poor historian as to what she is and isn't taking  Has been out of insulin for several days

## 2016-01-14 NOTE — Progress Notes (Signed)
Subjective:  Patient ID: Andrea [name], female    DOB: 02/17/1950  Age: 66 y.o. MRN: [id number]  CC: Diabetes; Hypertension; and Edema (hands and feet)   HPI Andrea [name]  is a 66 year old female with a history of uncontrolled type 2 diabetes mellitus (A1c 9.9 down from 13.0 previously), hypertension who Comes in today complaining of pedal edema for the last 3 days.  She is unsure of her medications and unsure if she has been taking hydrochlorothiazide. Complains of pain in both feet which is worse at night. Informs me she has a history of Gout but review of her chart states otherwise. Denies fever. She complains of urge incontinence and finds herself having accidents before she can make it to the bathroom.  Her blood pressure is elevated; she endorses compliance with all her medications.  Past Medical History:  Diagnosis Date  . Coronary artery disease   . Diabetes mellitus without complication ([place])   . Fibromyalgia     Past Surgical History:  Procedure Laterality Date  . CORONARY ARTERY BYPASS GRAFT    . KNEE SURGERY      Allergies  Allergen Reactions  . Naproxen   . Tramadol Hcl      Outpatient Medications Prior to Visit  Medication Sig Dispense Refill  . albuterol (PROVENTIL HFA;VENTOLIN HFA) 108 (90 Base) MCG/ACT inhaler Inhale 2 puffs into the lungs every 6 (six) hours as needed for wheezing or shortness of breath. 54 Inhaler 3  . amitriptyline (ELAVIL) 25 MG tablet Take 1 tablet (25 mg total) by mouth at bedtime. 30 tablet 2  . aspirin EC 81 MG tablet Take 1 tablet (81 mg total) by mouth daily. 90 tablet 3  . atorvastatin (LIPITOR) 80 MG tablet Take 1 tablet (80 mg total) by mouth daily. 90 tablet 3  . benzonatate (TESSALON PERLES) 100 MG capsule Take 1 capsule (100 mg total) by mouth 3 (three) times daily as needed for cough. 30 capsule 0  . carbamide peroxide (DEBROX) 6.5 % otic solution Place 5 drops into the right ear 2 (two) times daily. 15 mL 0  .  Fluticasone-Salmeterol (ADVAIR) 100-50 MCG/DOSE AEPB Inhale 1 puff into the lungs 2 (two) times daily. 180 each 3  . gabapentin (NEURONTIN) 300 MG capsule Take 1 capsule (300 mg total) by mouth 3 (three) times daily. 90 capsule 3  . IBUPROFEN PO Take by mouth.    Marland Kitchen lisinopril (PRINIVIL,ZESTRIL) 10 MG tablet Take 1 tablet (10 mg total) by mouth daily. 90 tablet 3  . hydrochlorothiazide (HYDRODIURIL) 25 MG tablet Take 1 tablet (25 mg total) by mouth daily. 90 tablet 3  . Blood Glucose Monitoring Suppl (TRUE METRIX GO GLUCOSE METER) w/Device KIT 1 each by Does not apply route every 8 (eight) hours as needed. (Patient not taking: Reported on 01/14/2016) 1 kit 0  . glucose blood (TRUE METRIX BLOOD GLUCOSE TEST) test strip Use as instructed (Patient not taking: Reported on 01/14/2016) 100 each 12  . Insulin Glargine (LANTUS SOLOSTAR) 100 UNIT/ML Solostar Pen Inject 25 Units into the skin daily at 10 pm. (Patient not taking: Reported on 01/14/2016) 30 mL 3  . Insulin Pen Needle (ULTICARE MICRO PEN NEEDLES) 32G X 4 MM MISC 1 applicator by Does not apply route at bedtime. (Patient not taking: Reported on 01/14/2016) 100 each 11  . metFORMIN (GLUCOPHAGE) 1000 MG tablet Take 1 tablet (1,000 mg total) by mouth 2 (two) times daily. 60 tablet 3  . nicotine (NICODERM CQ) 21 mg/24hr patch Place  1 patch (21 mg total) onto the skin daily. (Patient not taking: Reported on 01/14/2016) 28 patch 0  . omeprazole (PRILOSEC) 20 MG capsule Take 1 capsule (20 mg total) by mouth daily. (Patient not taking: Reported on 01/14/2016) 30 capsule 3  . TRUEPLUS LANCETS 26G MISC 1 each by Does not apply route every 8 (eight) hours as needed. (Patient not taking: Reported on 01/14/2016) 100 each 12  . azithromycin (ZITHROMAX) 250 MG tablet Day 1 take 2 tabs, day 2- 4 take 1 tab, than stop 6 tablet 0  . guaiFENesin-codeine 100-10 MG/5ML syrup Take 5 mLs by mouth every 6 (six) hours as needed for cough. 120 mL 0  . predniSONE (DELTASONE) 20 MG tablet  Take 3 tablets (60 mg total) by mouth daily with breakfast. Taper: Day 1-3, take 29m (=3 tabs) qam, day 4-7 take 487mqam (=2 tabs), day 8-10 take 1 tab daily, than stop 20 tablet 0  . promethazine (PHENERGAN) 25 MG tablet Take 1 tablet (25 mg total) by mouth every 8 (eight) hours as needed for nausea or vomiting. (Patient not taking: Reported on 11/15/2015) 20 tablet 0   No facility-administered medications prior to visit.     ROS Review of Systems  Constitutional: Negative for activity change, appetite change and fatigue.  HENT: Negative for congestion, sinus pressure and sore throat.   Eyes: Negative for visual disturbance.  Respiratory: Negative for cough, chest tightness, shortness of breath and wheezing.   Cardiovascular: Positive for leg swelling. Negative for chest pain and palpitations.  Gastrointestinal: Negative for abdominal distention, abdominal pain and constipation.  Endocrine: Negative for polydipsia.  Genitourinary: Negative for dysuria and frequency.  Musculoskeletal:       See hpi  Skin: Negative for rash.  Neurological: Negative for tremors, light-headedness and numbness.  Hematological: Does not bruise/bleed easily.  Psychiatric/Behavioral: Negative for agitation and behavioral problems.    Objective:  BP (!) 163/62 (BP Location: Right Arm, Patient Position: Sitting, Cuff Size: Small)   Pulse 72   Temp 98.2 F (36.8 C) (Oral)   Ht 5' 2"  (1.575 m)   Wt 145 lb 3.2 oz (65.9 kg)   SpO2 97%   BMI 26.56 kg/m   BP/Weight 01/14/2016 12/17/2015 1185/4/6270Systolic BP [id number] BP 62 80 71  Wt. (Lbs) 145.2 136.2 133  BMI 26.56 24.51 23.94      Physical Exam  Constitutional: She is oriented to person, place, and time. She appears well-developed and well-nourished.  Cardiovascular: Normal rate, normal heart sounds and intact distal pulses.   No murmur heard. Pulmonary/Chest: Effort normal and breath sounds normal. She has no wheezes. She has no  rales. She exhibits no tenderness.  Abdominal: Soft. Bowel sounds are normal. She exhibits no distension and no mass. There is no tenderness.  Musculoskeletal: Normal range of motion.  Neurological: She is alert and oriented to person, place, and time.  Skin:  Area of erythema on dorsal aspect of right big toe surrounding , some erythema on dorsal aspect of toes of left foot    Lab Results  Component Value Date   HGBA1C 9.9 12/17/2015    Assessment & Plan:   1. [place] with A1c of 9.9 which is down from 13.0 previously Stressed  compliance with medications - Glucose (CBG)  2. Pedal edema No visible pedal edema on exam Will refill hydrochlorothiazide and the patient is to call the clinic if she already has this at home  and still has persisting edema so Lasix can be prescribed.  3. Pain in joint involving ankle and foot, unspecified laterality We'll treat presumptively for cellulitis as she does have some erythema Will need to exclude gout - Uric Acid - cephALEXin (KEFLEX) 500 MG capsule; Take 1 capsule (500 mg total) by mouth 2 (two) times daily.  Dispense: 20 capsule; Refill: 1  4. Essential hypertension Uncontrolled Compliance with medications Patient to bring in all her medications to be recovered filed at her next visit Low sodium diet - hydrochlorothiazide (HYDRODIURIL) 25 MG tablet; Take 1 tablet (25 mg total) by mouth daily.  Dispense: 90 tablet; Refill: 3  5. Overactive bladder Kegel exercises. - oxybutynin (DITROPAN) 5 MG tablet; Take 1 tablet (5 mg total) by mouth 2 (two) times daily.  Dispense: 60 tablet; Refill: 2   Meds ordered this encounter  Medications  . cephALEXin (KEFLEX) 500 MG capsule    Sig: Take 1 capsule (500 mg total) by mouth 2 (two) times daily.    Dispense:  20 capsule    Refill:  1  . oxybutynin (DITROPAN) 5 MG tablet    Sig: Take 1 tablet (5 mg total) by mouth 2 (two) times daily.     Dispense:  60 tablet    Refill:  2  . hydrochlorothiazide (HYDRODIURIL) 25 MG tablet    Sig: Take 1 tablet (25 mg total) by mouth daily.    Dispense:  90 tablet    Refill:  3    Follow-up: Return in about 2 weeks (around 01/28/2016) for Follow-up on hypertension and foot pain.   Arnoldo Morale MD

## 2016-01-15 ENCOUNTER — Telehealth: Payer: Self-pay

## 2016-01-15 LAB — URIC ACID: Uric Acid, Serum: 3.7 mg/dL (ref 2.5–7.0)

## 2016-01-15 MED FILL — ATORVASTATIN 80 MG TABLET: 80 | 30 days supply | Qty: 30 | Fill #2

## 2016-01-15 MED FILL — ULTICARE PEN NDL 4MM 32G: 32G X 4 MM | 25 days supply | Qty: 100 | Fill #1

## 2016-01-15 MED FILL — ?LISINOPRIL 10 MG TABLET: 10 | 30 days supply | Qty: 30 | Fill #1

## 2016-01-15 MED FILL — ?OMEPRAZOLE DR 20 MG CAPSUL: 20 | 30 days supply | Qty: 30 | Fill #1

## 2016-01-15 MED FILL — ?METFORMIN HCL 1,000 MG TAB: 1000 | 30 days supply | Qty: 60 | Fill #2

## 2016-01-15 MED FILL — AMITRIPTYLINE HCL 25 MG TAB: 25 | 30 days supply | Qty: 30 | Fill #2

## 2016-01-15 MED FILL — GABAPENTIN 300 MG CAPSULE: 300 | 30 days supply | Qty: 90 | Fill #1

## 2016-01-15 NOTE — Telephone Encounter (Signed)
-----   Message from Enobong Amao, MD sent at 01/15/2016  8:44 AM EST ----- Please inform the patient that labs are normal. Thank you. 

## 2016-01-15 NOTE — Telephone Encounter (Signed)
Writer called patient with lab results.  Patient stated understanding and admits to taking her meds incorrectly.  However she is taking them as directed and her feet and ankles are improving.

## 2016-01-17 ENCOUNTER — Other Ambulatory Visit: Payer: Self-pay

## 2016-01-17 DIAGNOSIS — E1142 Type 2 diabetes mellitus with diabetic polyneuropathy: Secondary | ICD-10-CM

## 2016-01-17 MED ORDER — INSULIN GLARGINE 100 UNIT/ML SOLOSTAR PEN: 25.0000 [IU] | PEN_INJECTOR | Freq: Every day | SUBCUTANEOUS | 3 refills | Status: DC

## 2016-01-29 ENCOUNTER — Ambulatory Visit: Payer: Self-pay | Admitting: Family Medicine

## 2016-01-31 ENCOUNTER — Ambulatory Visit: Payer: Self-pay | Admitting: Family Medicine

## 2016-02-10 ENCOUNTER — Ambulatory Visit: Payer: Self-pay | Admitting: Family Medicine

## 2016-02-21 MED FILL — ATORVASTATIN 80 MG TABLET: 80 | 30 days supply | Qty: 30 | Fill #3

## 2016-02-21 MED FILL — LANTUS SOLOSTAR 100 UNITS/M: 100 | 30 days supply | Qty: 9 | Fill #1

## 2016-02-21 MED FILL — GABAPENTIN 300 MG CAPSULE: 300 | 30 days supply | Qty: 90 | Fill #2

## 2016-02-21 MED FILL — OMEPRAZOLE DR 20 MG CAPSULE: 20 | 30 days supply | Qty: 30 | Fill #2

## 2016-02-21 MED FILL — LISINOPRIL 10 MG TABLET: 10 | 30 days supply | Qty: 30 | Fill #2

## 2016-02-21 MED FILL — OXYBUTYNIN 5 MG TABLET: 5 | 30 days supply | Qty: 60 | Fill #1

## 2016-02-21 MED FILL — HYDROCHLOROTHIAZIDE 25 MG T: 25 | 30 days supply | Qty: 30 | Fill #1

## 2016-02-21 MED FILL — CEPHALEXIN 500 MG CAPSULE: 500 | 10 days supply | Qty: 20 | Fill #1

## 2016-02-21 MED FILL — metFORMIN HCL 1000 MG TABS: 1000 | 30 days supply | Qty: 60 | Fill #3

## 2016-02-24 ENCOUNTER — Encounter: Payer: Self-pay | Admitting: Licensed Clinical Social Worker

## 2016-02-24 ENCOUNTER — Encounter: Payer: Self-pay | Admitting: Family Medicine

## 2016-02-24 ENCOUNTER — Ambulatory Visit: Payer: BLUE CROSS/BLUE SHIELD | Attending: Family Medicine | Admitting: Family Medicine

## 2016-02-24 VITALS — BP 122/73 | HR 73 | Temp 99.4°F | Ht 62.0 in | Wt 143.4 lb

## 2016-02-24 DIAGNOSIS — Z79899 Other long term (current) drug therapy: Secondary | ICD-10-CM | POA: Insufficient documentation

## 2016-02-24 DIAGNOSIS — Z720 Tobacco use: Secondary | ICD-10-CM | POA: Insufficient documentation

## 2016-02-24 DIAGNOSIS — R21 Rash and other nonspecific skin eruption: Secondary | ICD-10-CM | POA: Insufficient documentation

## 2016-02-24 DIAGNOSIS — Z0001 Encounter for general adult medical examination with abnormal findings: Secondary | ICD-10-CM | POA: Insufficient documentation

## 2016-02-24 DIAGNOSIS — M79606 Pain in leg, unspecified: Secondary | ICD-10-CM | POA: Diagnosis not present

## 2016-02-24 DIAGNOSIS — E119 Type 2 diabetes mellitus without complications: Secondary | ICD-10-CM

## 2016-02-24 DIAGNOSIS — E1140 Type 2 diabetes mellitus with diabetic neuropathy, unspecified: Secondary | ICD-10-CM | POA: Insufficient documentation

## 2016-02-24 DIAGNOSIS — Z794 Long term (current) use of insulin: Secondary | ICD-10-CM | POA: Insufficient documentation

## 2016-02-24 DIAGNOSIS — E1149 Type 2 diabetes mellitus with other diabetic neurological complication: Secondary | ICD-10-CM

## 2016-02-24 DIAGNOSIS — I10 Essential (primary) hypertension: Secondary | ICD-10-CM | POA: Diagnosis not present

## 2016-02-24 DIAGNOSIS — Z7982 Long term (current) use of aspirin: Secondary | ICD-10-CM | POA: Insufficient documentation

## 2016-02-24 DIAGNOSIS — L97911 Non-pressure chronic ulcer of unspecified part of right lower leg limited to breakdown of skin: Secondary | ICD-10-CM | POA: Diagnosis not present

## 2016-02-24 DIAGNOSIS — L97921 Non-pressure chronic ulcer of unspecified part of left lower leg limited to breakdown of skin: Secondary | ICD-10-CM

## 2016-02-24 LAB — GLUCOSE, POCT (MANUAL RESULT ENTRY): POC GLUCOSE: 215 mg/dL — AB (ref 70–99)

## 2016-02-24 MED ORDER — GABAPENTIN 300 MG PO CAPS: 600.0000 mg | ORAL_CAPSULE | Freq: Three times a day (TID) | ORAL | 3 refills | Status: DC

## 2016-02-24 NOTE — Progress Notes (Signed)
Subjective:  Patient ID: Andrea [name], female    DOB: [birth year]  Age: 66 y.o. MRN: [id number]  CC: Diabetes; Foot Pain (bilateral pain, numbness,and swelling); and Hypertension   HPI Andrea [name] is a 66 year old female with a history of uncontrolled type 2 diabetes mellitus (A1c 9.9 down from 13.0 previously), hypertension who presents today for follow-up of lower extremity pedal edema.  She states pedal edema still persists despite taking hydrochlorothiazide however on exam she has no pedal edema but she does have black scabs on both lower extremities which are nonpainful and she endorses worsening. One of the scabs came from a scratch by her cat which she initially stated was a linear scar but later progressed; the other was a rash that she picked at which never healed.  She also complains of shooting pains in her legs and remains on gabapentin which does not help her symptoms.   She does not check blood sugars at home but endorses compliance with her medications  Past Medical History:  Diagnosis Date  . Coronary artery disease   . Diabetes mellitus without complication ([place])   . Fibromyalgia     Past Surgical History:  Procedure Laterality Date  . CORONARY ARTERY BYPASS GRAFT    . KNEE SURGERY      Allergies  Allergen Reactions  . Naproxen   . Tramadol Hcl      Outpatient Medications Prior to Visit  Medication Sig Dispense Refill  . albuterol (PROVENTIL HFA;VENTOLIN HFA) 108 (90 Base) MCG/ACT inhaler Inhale 2 puffs into the lungs every 6 (six) hours as needed for wheezing or shortness of breath. 54 Inhaler 3  . amitriptyline (ELAVIL) 25 MG tablet Take 1 tablet (25 mg total) by mouth at bedtime. 30 tablet 2  . aspirin EC 81 MG tablet Take 1 tablet (81 mg total) by mouth daily. 90 tablet 3  . atorvastatin (LIPITOR) 80 MG tablet Take 1 tablet (80 mg total) by mouth daily. 90 tablet 3  . benzonatate (TESSALON PERLES) 100 MG capsule Take 1 capsule (100 mg total) by  mouth 3 (three) times daily as needed for cough. 30 capsule 0  . Blood Glucose Monitoring Suppl (TRUE METRIX GO GLUCOSE METER) w/Device KIT 1 each by Does not apply route every 8 (eight) hours as needed. 1 kit 0  . carbamide peroxide (DEBROX) 6.5 % otic solution Place 5 drops into the right ear 2 (two) times daily. 15 mL 0  . Fluticasone-Salmeterol (ADVAIR) 100-50 MCG/DOSE AEPB Inhale 1 puff into the lungs 2 (two) times daily. 180 each 3  . glucose blood (TRUE METRIX BLOOD GLUCOSE TEST) test strip Use as instructed 100 each 12  . hydrochlorothiazide (HYDRODIURIL) 25 MG tablet Take 1 tablet (25 mg total) by mouth daily. 90 tablet 3  . IBUPROFEN PO Take by mouth.    . Insulin Glargine (LANTUS SOLOSTAR) 100 UNIT/ML Solostar Pen Inject 25 Units into the skin daily at 10 pm. 30 mL 3  . Insulin Pen Needle (ULTICARE MICRO PEN NEEDLES) 32G X 4 MM MISC 1 applicator by Does not apply route at bedtime. 100 each 11  . lisinopril (PRINIVIL,ZESTRIL) 10 MG tablet Take 1 tablet (10 mg total) by mouth daily. 90 tablet 3  . metFORMIN (GLUCOPHAGE) 1000 MG tablet Take 1 tablet (1,000 mg total) by mouth 2 (two) times daily. 60 tablet 3  . oxybutynin (DITROPAN) 5 MG tablet Take 1 tablet (5 mg total) by mouth 2 (two) times daily. 60 tablet 2  . TRUEPLUS LANCETS  26G MISC 1 each by Does not apply route every 8 (eight) hours as needed. 100 each 12  . gabapentin (NEURONTIN) 300 MG capsule Take 1 capsule (300 mg total) by mouth 3 (three) times daily. 90 capsule 3  . nicotine (NICODERM CQ) 21 mg/24hr patch Place 1 patch (21 mg total) onto the skin daily. (Patient not taking: Reported on 01/14/2016) 28 patch 0  . omeprazole (PRILOSEC) 20 MG capsule Take 1 capsule (20 mg total) by mouth daily. (Patient not taking: Reported on 01/14/2016) 30 capsule 3  . cephALEXin (KEFLEX) 500 MG capsule Take 1 capsule (500 mg total) by mouth 2 (two) times daily. 20 capsule 1   No facility-administered medications prior to visit.     ROS Review  of Systems  Constitutional: Negative for activity change, appetite change and fatigue.  HENT: Negative for congestion, sinus pressure and sore throat.   Eyes: Negative for visual disturbance.  Respiratory: Negative for cough, chest tightness, shortness of breath and wheezing.   Cardiovascular: Positive for leg swelling. Negative for chest pain and palpitations.  Gastrointestinal: Negative for abdominal distention, abdominal pain and constipation.  Endocrine: Negative for polydipsia.  Genitourinary: Negative for dysuria and frequency.  Musculoskeletal: Negative for arthralgias and back pain.  Skin: Positive for rash.  Neurological: Negative for tremors, light-headedness and numbness.  Hematological: Does not bruise/bleed easily.  Psychiatric/Behavioral: Negative for agitation and behavioral problems.    Objective:  BP 122/73 (BP Location: Right Arm, Patient Position: Sitting, Cuff Size: Large)   Pulse 73   Temp 99.4 F (37.4 C) (Oral)   Ht 5' 2"  (1.575 m)   Wt 143 lb 6.4 oz (65 kg)   BMI 26.23 kg/m   BP/Weight 02/24/2016 01/14/2016 57/02/6201  Systolic BP 559 741 638  Diastolic BP 73 62 80  Wt. (Lbs) 143.4 145.2 136.2  BMI 26.23 26.56 24.51      Physical Exam  Constitutional: She is oriented to person, place, and time. She appears well-developed and well-nourished.  Cardiovascular: Normal rate and normal heart sounds.   No murmur heard. Unable to palpate dorsalis pedis  Pulmonary/Chest: Effort normal and breath sounds normal. She has no wheezes. She has no rales. She exhibits no tenderness.  Abdominal: Soft. Bowel sounds are normal. She exhibits no distension and no mass. There is no tenderness.  Musculoskeletal: Normal range of motion. She exhibits no edema.  Neurological: She is alert and oriented to person, place, and time.  Skin:  Black Scars on dorsum of right great toe, lateral aspect left leg    Lab Results  Component Value Date   HGBA1C 9.9 12/17/2015     Assessment & Plan:   1. Diabetes mellitus without complication ([place]) Uncontrolled with A1c of 9.9 Advised to comply with checking blood sugars We will review log at next visit and adjust regimen as indicated We will refer to podiatry at next visit - Glucose (CBG) - HgB A1c  2. Essential hypertension Controlled Low-sodium diet  3. Ulcers of both lower extremities, limited to breakdown of skin (HCC) Lower extremity concerning for peripheral vascular disease especially in the setting of nonpalpable pulses and nonhealing ulcers - Ambulatory referral to Vascular Surgery  4. Other diabetic neurological complication associated with type 2 diabetes mellitus ([facility]) Uncontrolled Increased dose of gabapentin - gabapentin (NEURONTIN) 300 MG capsule; Take 2 capsules (600 mg total) by mouth 3 (three) times daily.  Dispense: 180 capsule; Refill: 3  5. Tobacco abuse Spent 4 minutes counseling on cessation and the role of  tobacco abuse in worsening of cardiovascular disease.   Meds ordered this encounter  Medications  . gabapentin (NEURONTIN) 300 MG capsule    Sig: Take 2 capsules (600 mg total) by mouth 3 (three) times daily.    Dispense:  180 capsule    Refill:  3    Discontinue previous dose    Follow-up: Return in about 6 weeks (around 04/06/2016) for Follow-up of chronic medical conditions.   Arnoldo Morale MD

## 2016-02-25 NOTE — BH Specialist Note (Signed)
Session Start time: 5:00 PM   End Time: 5:30 PM Total Time:  30 minutes Type of Service: Behavioral Health - Individual/Family Interpreter: No.   Interpreter Name & Language: N/A # [facility]   SUBJECTIVE: Andrea [name] is a 66 y.o. female  Pt. was referred by Dr. Venetia NightAmao for:  anxiety and depression. Pt. reports the following symptoms/concerns: overwhelming feelings of sadness and worry, difficulty sleeping, low energy, racing thoughts, inability to concentrate, and withdrawn behavior Duration of problem:  Ongoing Pt reports being diagnosed with Depression and Anxiety ten years ago Severity: severe Previous treatment: None reported   OBJECTIVE: Mood: Pleasant & Affect: Tearful Risk of harm to self or others: Pt denied SI/HI/AVH Assessments administered: PHQ-9; GAD-7  LIFE CONTEXT:  Family & Social: Pt resides alone. She reports spouse passing away eight years ago, friends have passed away, and she has a strained relationship with her family.   School/ Work: Pt receives [employer] insurance and [employer] security. She is interested in applying for disability Self-Care: No report of substance use Life changes: Pt is grieving the loss of spouse and step-daughter. She has limited support What is important to pt/family (values): Family, Good Health, Independence   GOALS ADDRESSED:  Decrease symptoms of depression Decrease symptoms of anxiety Increase adequate support symptoms for patient  INTERVENTIONS: Solution Focused, Strength-based and Supportive   ASSESSMENT:  Pt currently experiencing depression and anxiety triggered by financial strain. She reports overwhelming feelings of sadness and worry, difficulty sleeping, low energy, racing thoughts, inability to concentrate, and withdrawn behavior. She has limited support. Pt may benefit from psychotherapy and medication management. LCSWA provided education on anxiety and depression. LCSWA discussed benefits of  applying healthy coping skills to decrease symptoms and pt identified coping strategies to utilize on a daily basis. LCSWA provided information on how to apply for disability, food insecurity, and provided information on [facility] to assist in socializing with others. Pt was provided with additional resources for crisis intervention, therapy, and medication management.       PLAN: 1. F/U with behavioral health clinician: Pt was encouraged to contact LCSWA if symptoms worsen or fail to improve to [facility] 2. Behavioral Health meds: Elavil 3. Behavioral recommendations: LCSWA recommends that pt apply healthy coping skills discussed and utilize resources as needed. Pt is encouraged to schedule follow up appointment with LCSWA 4. Referral: Brief Counseling/Psychotherapy, [employer], Problem-solving teaching/coping strategies, Psychoeducation and Supportive Counseling 5. From scale of 1-10, how likely are you to follow plan: 8/10   Bridgett LarssonJasmine D Lodema [name], MSW, [facility]  Clinical Social Worker 02/26/16 3:57 PM  Warmhandoff:   Warm Hand Off Completed.

## 2016-03-06 MED FILL — GABAPENTIN 300 MG CAPSULE: 300 | 30 days supply | Qty: 180 | Fill #0

## 2016-03-16 ENCOUNTER — Other Ambulatory Visit: Payer: Self-pay

## 2016-03-16 ENCOUNTER — Ambulatory Visit (HOSPITAL_COMMUNITY)
Admission: RE | Admit: 2016-03-16 | Discharge: 2016-03-16 | Disposition: A | Discharge status: Home / Self Care | Payer: BLUE CROSS/BLUE SHIELD | Source: Ambulatory Visit | Attending: Surgery | Admitting: Surgery

## 2016-03-16 DIAGNOSIS — I739 Peripheral vascular disease, unspecified: Secondary | ICD-10-CM | POA: Diagnosis present

## 2016-03-18 MED FILL — LANTUS SOLOSTAR 100 UNITS/M: 100 | 30 days supply | Qty: 9 | Fill #2

## 2016-03-18 MED FILL — ULTICARE PEN NDL 4MM 32G: 32G X 4 MM | 12 days supply | Qty: 50 | Fill #2

## 2016-03-18 MED FILL — ATORVASTATIN 80 MG TABLET: 80 | 30 days supply | Qty: 30 | Fill #4

## 2016-03-18 MED FILL — LISINOPRIL 10 MG TABLET: 10 | 30 days supply | Qty: 30 | Fill #3

## 2016-03-18 MED FILL — OMEPRAZOLE DR 20 MG CAPSULE: 20 | 30 days supply | Qty: 30 | Fill #3

## 2016-03-18 MED FILL — HYDROCHLOROTHIAZIDE 25 MG T: 25 | 30 days supply | Qty: 30 | Fill #2

## 2016-03-18 MED FILL — OXYBUTYNIN 5 MG TABLET: 5 | 30 days supply | Qty: 60 | Fill #2

## 2016-03-18 MED FILL — metFORMIN HCL 1000 MG TABS: 1000 | 30 days supply | Qty: 60 | Fill #1

## 2016-03-20 ENCOUNTER — Encounter: Payer: BLUE CROSS/BLUE SHIELD | Admitting: Vascular Surgery

## 2016-03-20 ENCOUNTER — Ambulatory Visit (INDEPENDENT_AMBULATORY_CARE_PROVIDER_SITE_OTHER): Payer: BLUE CROSS/BLUE SHIELD | Admitting: Vascular Surgery

## 2016-03-20 ENCOUNTER — Other Ambulatory Visit: Payer: Self-pay

## 2016-03-20 ENCOUNTER — Encounter: Payer: Self-pay | Admitting: Vascular Surgery

## 2016-03-20 VITALS — BP 125/65 | HR 72 | Temp 98.6°F | Resp 18 | Ht 62.0 in | Wt 145.0 lb

## 2016-03-20 DIAGNOSIS — I739 Peripheral vascular disease, unspecified: Secondary | ICD-10-CM | POA: Diagnosis not present

## 2016-03-20 NOTE — Progress Notes (Signed)
Patient ID: Andrea [name], female   DOB: 06-22-1950, 66 y.o.   MRN: [id number]  Reason for Consult: New Evaluation (eval PVD w/ ulcer )   Referred by Arnoldo Morale, MD  Subjective:     HPI:  Andrea [name] is a 66 y.o. female here for evaluation of bilateral lower extremity pain as well as a wound on her right foot. She does have a history of coronary artery disease having had bypass surgery as well as diabetes. She states that the wound on the dorsum of her right foot began with a low-grade trauma perhaps from a cat scratch 1 month ago. It is now progressed having dry ulceration. She also has progressive redness to both of her feet as well as pain. She does not walk much because of the pain but does not get specific claudication-like cramping. Rather she has burning in her bilateral lower extremities and she is also very weak. She has never had intervention for her bilateral lower extremities. She does not have fevers or chills to go along with her pain. She has never had ulceration or toe intervention that needed to heal before. She is very tearful on our evaluation today. She continues to smoke daily and has smoked more of late because of the pain. She does demonstrate on desire to quit at this time. She has never had stroke TIA or amaurosis suggest carotid artery disease. She is not accompanied by any family members today. She does take aspirin and Lipitor.  Past Medical History:  Diagnosis Date  . Coronary artery disease   . Diabetes mellitus without complication ([place])   . Fibromyalgia    History reviewed. No pertinent family history. Past Surgical History:  Procedure Laterality Date  . CORONARY ARTERY BYPASS GRAFT    . KNEE SURGERY      Short Social History:  Social History  Substance Use Topics  . Smoking status: Current Every Day Smoker    Packs/day: 0.25    Types: Cigarettes  . Smokeless tobacco: Never Used     Comment: 10 cigs daily  . Alcohol use No    Allergies    Allergen Reactions  . Naproxen   . Tramadol Hcl     Current Outpatient Prescriptions  Medication Sig Dispense Refill  . albuterol (PROVENTIL HFA;VENTOLIN HFA) 108 (90 Base) MCG/ACT inhaler Inhale 2 puffs into the lungs every 6 (six) hours as needed for wheezing or shortness of breath. 54 Inhaler 3  . amitriptyline (ELAVIL) 25 MG tablet Take 1 tablet (25 mg total) by mouth at bedtime. 30 tablet 2  . aspirin EC 81 MG tablet Take 1 tablet (81 mg total) by mouth daily. 90 tablet 3  . atorvastatin (LIPITOR) 80 MG tablet Take 1 tablet (80 mg total) by mouth daily. 90 tablet 3  . benzonatate (TESSALON PERLES) 100 MG capsule Take 1 capsule (100 mg total) by mouth 3 (three) times daily as needed for cough. 30 capsule 0  . Blood Glucose Monitoring Suppl (TRUE METRIX GO GLUCOSE METER) w/Device KIT 1 each by Does not apply route every 8 (eight) hours as needed. 1 kit 0  . carbamide peroxide (DEBROX) 6.5 % otic solution Place 5 drops into the right ear 2 (two) times daily. 15 mL 0  . Fluticasone-Salmeterol (ADVAIR) 100-50 MCG/DOSE AEPB Inhale 1 puff into the lungs 2 (two) times daily. 180 each 3  . gabapentin (NEURONTIN) 300 MG capsule Take 2 capsules (600 mg total) by mouth 3 (three) times daily. 180 capsule 3  .  glucose blood (TRUE METRIX BLOOD GLUCOSE TEST) test strip Use as instructed 100 each 12  . hydrochlorothiazide (HYDRODIURIL) 25 MG tablet Take 1 tablet (25 mg total) by mouth daily. 90 tablet 3  . IBUPROFEN PO Take by mouth.    . Insulin Glargine (LANTUS SOLOSTAR) 100 UNIT/ML Solostar Pen Inject 25 Units into the skin daily at 10 pm. 30 mL 3  . Insulin Pen Needle (ULTICARE MICRO PEN NEEDLES) 32G X 4 MM MISC 1 applicator by Does not apply route at bedtime. 100 each 11  . lisinopril (PRINIVIL,ZESTRIL) 10 MG tablet Take 1 tablet (10 mg total) by mouth daily. 90 tablet 3  . metFORMIN (GLUCOPHAGE) 1000 MG tablet Take 1 tablet (1,000 mg total) by mouth 2 (two) times daily. 60 tablet 3  . nicotine  (NICODERM CQ) 21 mg/24hr patch Place 1 patch (21 mg total) onto the skin daily. 28 patch 0  . omeprazole (PRILOSEC) 20 MG capsule Take 1 capsule (20 mg total) by mouth daily. 30 capsule 3  . oxybutynin (DITROPAN) 5 MG tablet Take 1 tablet (5 mg total) by mouth 2 (two) times daily. 60 tablet 2  . TRUEPLUS LANCETS 26G MISC 1 each by Does not apply route every 8 (eight) hours as needed. 100 each 12   No current facility-administered medications for this visit.     Review of Systems  Constitutional:  Constitutional negative. HENT: HENT negative.  Eyes: Eyes negative.  Respiratory: Respiratory negative.  Cardiovascular: Positive for leg swelling.  GI: Gastrointestinal negative.  Musculoskeletal: Positive for leg pain.  Skin: Positive for wound.  Neurological: Neurological negative. Hematologic: Hematologic/lymphatic negative.  Psychiatric: Psychiatric negative.        Objective:  Objective   Vitals:   03/20/16 1105  BP: 125/65  Pulse: 72  Resp: 18  Temp: 98.6 F (37 C)  TempSrc: Oral  SpO2: 100%  Weight: 145 lb (65.8 kg)  Height: _0  (1.575 m)   Body mass index is 26.52 kg/m.  Physical Exam  Constitutional: She is oriented to person, place, and time. She appears well-developed.  HENT:  Head: Normocephalic.  Eyes: Pupils are equal, round, and reactive to light.  Neck: Normal range of motion.  Cardiovascular:  Pulses:      Carotid pulses are 2+ on the right side, and 2+ on the left side.      Femoral pulses are 2+ on the right side, and 2+ on the left side. Abdominal: Soft. She exhibits no mass.  Musculoskeletal: Normal range of motion.  Neurological: She is alert and oriented to person, place, and time.  Skin:  1cm ulcer with dry eschar overlying on dorsum right foot  Psychiatric: She has a normal mood and affect. Her behavior is normal. Judgment and thought content normal.    Data:      Assessment/Plan:     66 year old female here for evaluation bilateral  lower extremity vascular disease. She does have a wound on her right foot that is been slow to heal. She is severely depressed ABIs of 0.3 bilaterally. This time she is indicated for aortogram bilateral lower extremity runoff and intervention to start with on the right. If there is no endovascular intervention she will need consideration of bypass and would need cardiac clearance prior to that. She will also need evaluation of her left lower extremity and possible intervention for rest pain we are finished with the right. I have asked her to urgently stop smoking and she is in favor of this but does  not 1 medications today. He continue her aspirin and statin and we'll plan for procedure very soon.     Waynetta Sandy MD [facility]

## 2016-03-25 ENCOUNTER — Encounter: Payer: BLUE CROSS/BLUE SHIELD | Admitting: Vascular Surgery

## 2016-03-25 ENCOUNTER — Ambulatory Visit (HOSPITAL_COMMUNITY)
Admission: RE | Admit: 2016-03-25 | Discharge: 2016-03-25 | Disposition: A | Discharge status: Home / Self Care | Payer: BLUE CROSS/BLUE SHIELD | Source: Ambulatory Visit | Attending: Vascular Surgery | Admitting: Vascular Surgery

## 2016-03-25 ENCOUNTER — Encounter (HOSPITAL_COMMUNITY)
Admission: RE | Disposition: A | Discharge status: Home / Self Care | Payer: Self-pay | Source: Ambulatory Visit | Attending: Vascular Surgery

## 2016-03-25 ENCOUNTER — Other Ambulatory Visit: Payer: Self-pay | Admitting: *Deleted

## 2016-03-25 DIAGNOSIS — I70293 Other atherosclerosis of native arteries of extremities, bilateral legs: Secondary | ICD-10-CM | POA: Diagnosis not present

## 2016-03-25 DIAGNOSIS — I739 Peripheral vascular disease, unspecified: Secondary | ICD-10-CM | POA: Diagnosis not present

## 2016-03-25 DIAGNOSIS — Z01810 Encounter for preprocedural cardiovascular examination: Secondary | ICD-10-CM

## 2016-03-25 DIAGNOSIS — S90921D Unspecified superficial injury of right foot, subsequent encounter: Secondary | ICD-10-CM | POA: Diagnosis not present

## 2016-03-25 DIAGNOSIS — X58XXXD Exposure to other specified factors, subsequent encounter: Secondary | ICD-10-CM | POA: Insufficient documentation

## 2016-03-25 DIAGNOSIS — I700 Atherosclerosis of aorta: Secondary | ICD-10-CM | POA: Diagnosis present

## 2016-03-25 HISTORY — PX: ABDOMINAL AORTOGRAM W/LOWER EXTREMITY: CATH118223

## 2016-03-25 LAB — POCT I-STAT, CHEM 8
BUN: 20 mg/dL (ref 6–20)
CREATININE: 0.5 mg/dL (ref 0.44–1.00)
Calcium, Ion: 1.13 mmol/L — ABNORMAL LOW (ref 1.15–1.40)
Chloride: 102 mmol/L (ref 101–111)
Glucose, Bld: 187 mg/dL — ABNORMAL HIGH (ref 65–99)
HEMATOCRIT: 40 % (ref 36.0–46.0)
HEMOGLOBIN: 13.6 g/dL (ref 12.0–15.0)
POTASSIUM: 4.3 mmol/L (ref 3.5–5.1)
Sodium: 141 mmol/L (ref 135–145)
TCO2: 29 mmol/L (ref 0–100)

## 2016-03-25 LAB — GLUCOSE, CAPILLARY: GLUCOSE-CAPILLARY: 125 mg/dL — AB (ref 65–99)

## 2016-03-25 SURGERY — ABDOMINAL AORTOGRAM W/LOWER EXTREMITY

## 2016-03-25 MED ORDER — MIDAZOLAM HCL 2 MG/2ML IJ SOLN
INTRAMUSCULAR | Status: AC
  Filled 2016-03-25: qty 2

## 2016-03-25 MED ORDER — HEPARIN (PORCINE) IN NACL 2-0.9 UNIT/ML-% IJ SOLN
INTRAMUSCULAR | Status: AC
  Filled 2016-03-25: qty 1000

## 2016-03-25 MED ORDER — IODIXANOL 320 MG/ML IV SOLN
INTRAVENOUS | Status: DC | PRN
  Administered 2016-03-25: 97 mL via INTRAVENOUS

## 2016-03-25 MED ORDER — FENTANYL CITRATE (PF) 100 MCG/2ML IJ SOLN
INTRAMUSCULAR | Status: DC | PRN
  Administered 2016-03-25: 50 ug via INTRAVENOUS

## 2016-03-25 MED ORDER — HYDRALAZINE HCL 20 MG/ML IJ SOLN
INTRAMUSCULAR | Status: AC
  Filled 2016-03-25: qty 1

## 2016-03-25 MED ORDER — LIDOCAINE HCL (PF) 1 % IJ SOLN
INTRAMUSCULAR | Status: DC | PRN
  Administered 2016-03-25: 10 mL via SUBCUTANEOUS

## 2016-03-25 MED ORDER — FENTANYL CITRATE (PF) 100 MCG/2ML IJ SOLN
INTRAMUSCULAR | Status: AC
  Filled 2016-03-25: qty 2

## 2016-03-25 MED ORDER — SODIUM CHLORIDE 0.9 % IV SOLN: INTRAVENOUS | Status: DC

## 2016-03-25 MED ORDER — HEPARIN (PORCINE) IN NACL 2-0.9 UNIT/ML-% IJ SOLN
INTRAMUSCULAR | Status: DC | PRN
  Administered 2016-03-25: 1000 mL via INTRA_ARTERIAL

## 2016-03-25 MED ORDER — SODIUM CHLORIDE 0.9 % IV SOLN: 1.0000 mL/kg/h | INTRAVENOUS | Status: DC

## 2016-03-25 MED ORDER — LIDOCAINE HCL (PF) 1 % IJ SOLN
INTRAMUSCULAR | Status: AC
  Filled 2016-03-25: qty 30

## 2016-03-25 MED ORDER — MIDAZOLAM HCL 2 MG/2ML IJ SOLN
INTRAMUSCULAR | Status: DC | PRN
  Administered 2016-03-25: 1 mg via INTRAVENOUS

## 2016-03-25 SURGICAL SUPPLY — 11 items
CATH ANGIO 5F JB1 100CM (CATHETERS) ×2 IMPLANT
CATH OMNI FLUSH 5F 65CM (CATHETERS) ×2 IMPLANT
COVER PRB 48X5XTLSCP FOLD TPE (BAG) IMPLANT
COVER PROBE 5X48 (BAG) ×2
KIT PV (KITS) ×2 IMPLANT
SHEATH PINNACLE 5F 10CM (SHEATH) ×1 IMPLANT
SYR MEDRAD MARK V 150ML (SYRINGE) ×2 IMPLANT
TRANSDUCER W/STOPCOCK (MISCELLANEOUS) ×2 IMPLANT
TRAY PV CATH (CUSTOM PROCEDURE TRAY) ×2 IMPLANT
WIRE BENTSON .035X145CM (WIRE) ×1 IMPLANT
WIRE MINI STICK MAX (SHEATH) ×1 IMPLANT

## 2016-03-25 NOTE — Discharge Instructions (Signed)
°  HOLD METFORMIN FOR 48 HOURS. MAY RESUME SUN MORNING  DRINK PLENTY OF FLUIDS FOR THE NEXT FEW DAYS TO FLUSH THE DYE FROM YOUR SYSTEM.  Femoral Site Care Refer to this sheet in the next few weeks. These instructions provide you with information about caring for yourself after your procedure. Your health care provider may also give you more specific instructions. Your treatment has been planned according to current medical practices, but problems sometimes occur. Call your health care provider if you have any problems or questions after your procedure. What can I expect after the procedure? After your procedure, it is typical to have the following:  Bruising at the site that usually fades within 1-2 weeks.  Blood collecting in the tissue (hematoma) that may be painful to the touch. It should usually decrease in size and tenderness within 1-2 weeks. Follow these instructions at home:  Take medicines only as directed by your health care provider.  You may shower 24-48 hours after the procedure or as directed by your health care provider. Remove the bandage (dressing) and gently wash the site with plain soap and water. Pat the area dry with a clean towel. Do not rub the site, because this may cause bleeding.  Do not take baths, swim, or use a hot tub until your health care provider approves.  Check your insertion site every day for redness, swelling, or drainage.  Do not apply powder or lotion to the site.  Limit use of stairs to twice a day for the first 2-3 days or as directed by your health care provider.  Do not squat for the first 2-3 days or as directed by your health care provider.  Do not lift over 10 lb (4.5 kg) for 5 days after your procedure or as directed by your health care provider.  Ask your health care provider when it is okay to:  Return to work or school.  Resume usual physical activities or sports.  Resume sexual activity.  Do not drive home if you are discharged  the same day as the procedure. Have someone else drive you.  You may drive 24 hours after the procedure unless otherwise instructed by your health care provider.  Do not operate machinery or power tools for 24 hours after the procedure or as directed by your health care provider.  If your procedure was done as an outpatient procedure, which means that you went home the same day as your procedure, a responsible adult should be with you for the first 24 hours after you arrive home.  Keep all follow-up visits as directed by your health care provider. This is important. Contact a health care provider if:  You have a fever.  You have chills.  You have increased bleeding from the site. Hold pressure on the site. Get help right away if:  You have unusual pain at the site.  You have redness, warmth, or swelling at the site.  You have drainage (other than a small amount of blood on the dressing) from the site.  The site is bleeding, and the bleeding does not stop after 30 minutes of holding steady pressure on the site.  Your leg or foot becomes pale, cool, tingly, or numb. This information is not intended to replace advice given to you by your health care provider. Make sure you discuss any questions you have with your health care provider. Document Released: 09/01/2013 Document Revised: 06/06/2015 Document Reviewed: 07/18/2013 Elsevier Interactive Patient Education  2017 [employer]

## 2016-03-25 NOTE — H&P (Signed)
   History and Physical Update  The patient was interviewed and re-examined.  The patient's previous History and Physical has been reviewed and is unchanged from office visit. Plan for aortogram with bilateral runoff and possible intervention.  Brandon C. Randie Heinzain, MD [facility] [place] Office: [phone] Pager: [phone]   03/25/2016, 2:17 PM

## 2016-03-25 NOTE — Progress Notes (Signed)
Site area: Left groin a 5 french arterial sheath was removed  Site Prior to Removal:  Level 0  Pressure Applied For 20 MINUTES    Bedrest Beginning at 1645p  Manual:   Yes.    Patient Status During Pull:  stable  Post Pull Groin Site:  Level 0  Post Pull Instructions Given:  Yes.    Post Pull Pulses Present:  Yes.    Dressing Applied:  Yes.    Comments:  VS remain stable during sheath pull.

## 2016-03-25 NOTE — Op Note (Signed)
    Patient name: Hilbert Odoroni Cathey MRN: [id number] DOB: 08-Jul-1950 Sex: female  03/25/2016 Pre-operative Diagnosis: critical bilateral lower extremity ischemia with right foot wounds Post-operative diagnosis:  Same Surgeon:  Luanna SalkBrandon C. Randie Heinzain, MD Procedure Performed: 1.  US guided cannulation of left common femoral artery 2.  Aortogram with bilateral lower extremity runoff 3.  Moderate sedation for 22 minutes versed and fentanyl  Indications:  66 year old female presented to the office with severely depressed ABIs bilaterally. She has a wound on her right foot following a low-grade trauma that is failing to heal. She is therefore indicated for the above procedure.  Findings: The aorta does have an area of stenosis that is less than 30% below the renal arteries. The left common iliac artery has a less than 50% stenosis. The external iliac arteries are very diminutive on the left side the sheath was occlusive. She has bilateral SFA occlusions that are flush. She reconstituted her bilateral popliteal arteries but artery disease until the below-knee area. On the right side she has runoff dominant. PT and peroneal arteries. The left side dominant appears to be anterior to the artery although it is not time specifically for the left lower extremity.   Procedure:  The patient was identified in the holding area and taken to room 8.  The patient was then placed supine on the table and prepped and draped in the usual sterile fashion.  A time out was called.  Ultrasound was used to evaluate the left common femoral artery.  It was patent .  A digital ultrasound image was acquired.  A micropuncture needle was used to access the left common femoral artery under ultrasound guidance.  An 018 wire was advanced without resistance and a micropuncture sheath was placed.  The 018 wire was removed and a benson wire was placed.  The micropuncture sheath was exchanged for a 5 french sheath.  An omniflush catheter was advanced  over the wire to the level of L-1.  An abdominal angiogram was obtained.  Catheter was then withdrawn to near the bifurcation bilateral lower extremity runoff was obtained with the above findings. With this patient will need right femoral to popliteal artery bypass for wound followed by the same on the left.   Contrast: 97 mL.     Mckell Riecke C. Randie Heinzain, MD [facility] [place] Office: [phone] Pager: [phone]

## 2016-03-26 ENCOUNTER — Encounter: Payer: Self-pay | Admitting: Vascular Surgery

## 2016-03-26 ENCOUNTER — Other Ambulatory Visit: Payer: Self-pay | Admitting: *Deleted

## 2016-03-26 ENCOUNTER — Encounter (HOSPITAL_COMMUNITY): Payer: Self-pay | Admitting: Vascular Surgery

## 2016-03-26 ENCOUNTER — Telehealth: Payer: Self-pay | Admitting: Vascular Surgery

## 2016-03-26 DIAGNOSIS — Z01810 Encounter for preprocedural cardiovascular examination: Secondary | ICD-10-CM

## 2016-03-26 DIAGNOSIS — I6523 Occlusion and stenosis of bilateral carotid arteries: Secondary | ICD-10-CM

## 2016-03-26 MED FILL — Hydralazine HCl Inj 20 MG/ML: INTRAMUSCULAR | Qty: 1 | Status: AC

## 2016-03-26 NOTE — Telephone Encounter (Signed)
The patient is scheduled for a vein mapping on 3/20 @ 2:30pm, Cardiac Clearance with Dr. Elease HashimotoNahser on 3/27 @ 2:30pm, and Office visit with Dr. Randie Heinzain on 3/28 @ 11:30am. The patient is aware of all of these appointments

## 2016-03-26 NOTE — Telephone Encounter (Signed)
-----   Message from Sharee PimpleMarilyn K McChesney, RN sent at 03/25/2016  5:13 PM EDT ----- Regarding: asap w/ vein map   ----- Message ----- From: Maeola HarmanBrandon Christopher Cain, MD Sent: 03/25/2016   3:43 PM To: [facility] Charge Pool  Andrea [name] [id number] 04-Oct-1950  03/25/2016 Pre-operative Diagnosis: critical bilateral lower extremity ischemia with right foot wounds  Surgeon:  Luanna SalkBrandon C. Randie Heinzain, MD  Procedure Performed: 1.  US guided cannulation of left common femoral artery 2.  Aortogram with bilateral lower extremity runoff 3.  Moderate sedation for 22 minutes versed and fentanyl  She needs to have bilateral saphenous vein mapping for bypass surgery

## 2016-03-27 ENCOUNTER — Telehealth: Payer: Self-pay | Admitting: Vascular Surgery

## 2016-03-27 NOTE — Telephone Encounter (Signed)
-----   Message from Sharee PimpleMarilyn K McChesney, RN sent at 03/26/2016  2:21 PM EDT ----- Regarding: Add carotid duplex to preop vein mapping that was already sent   ----- Message ----- From: Maeola HarmanBrandon Christopher Cain, MD Sent: 03/26/2016   1:37 PM To: [facility] Charge Pool  She should also get carotid duplexes as workup prior to bypass surgery along with vein mapping. Sorry for oversight.

## 2016-03-27 NOTE — Telephone Encounter (Signed)
Scheduled 3/19 @ 2pm. Left patient a message to confirm.

## 2016-03-30 ENCOUNTER — Encounter: Payer: Self-pay | Admitting: Family Medicine

## 2016-03-30 ENCOUNTER — Ambulatory Visit: Payer: BLUE CROSS/BLUE SHIELD | Attending: Family Medicine | Admitting: Family Medicine

## 2016-03-30 ENCOUNTER — Ambulatory Visit (HOSPITAL_COMMUNITY): Payer: Self-pay

## 2016-03-30 VITALS — BP 120/60 | HR 63 | Temp 98.2°F | Ht 62.0 in | Wt 145.4 lb

## 2016-03-30 DIAGNOSIS — E1149 Type 2 diabetes mellitus with other diabetic neurological complication: Secondary | ICD-10-CM

## 2016-03-30 DIAGNOSIS — I10 Essential (primary) hypertension: Secondary | ICD-10-CM | POA: Insufficient documentation

## 2016-03-30 DIAGNOSIS — E7800 Pure hypercholesterolemia, unspecified: Secondary | ICD-10-CM | POA: Diagnosis not present

## 2016-03-30 DIAGNOSIS — Z79899 Other long term (current) drug therapy: Secondary | ICD-10-CM | POA: Diagnosis not present

## 2016-03-30 DIAGNOSIS — Z7982 Long term (current) use of aspirin: Secondary | ICD-10-CM | POA: Insufficient documentation

## 2016-03-30 DIAGNOSIS — Z5189 Encounter for other specified aftercare: Secondary | ICD-10-CM | POA: Insufficient documentation

## 2016-03-30 DIAGNOSIS — J0100 Acute maxillary sinusitis, unspecified: Secondary | ICD-10-CM

## 2016-03-30 DIAGNOSIS — E11621 Type 2 diabetes mellitus with foot ulcer: Secondary | ICD-10-CM | POA: Diagnosis not present

## 2016-03-30 DIAGNOSIS — Z9889 Other specified postprocedural states: Secondary | ICD-10-CM | POA: Insufficient documentation

## 2016-03-30 DIAGNOSIS — J320 Chronic maxillary sinusitis: Secondary | ICD-10-CM | POA: Diagnosis not present

## 2016-03-30 DIAGNOSIS — E1142 Type 2 diabetes mellitus with diabetic polyneuropathy: Secondary | ICD-10-CM | POA: Insufficient documentation

## 2016-03-30 DIAGNOSIS — E785 Hyperlipidemia, unspecified: Secondary | ICD-10-CM | POA: Diagnosis not present

## 2016-03-30 DIAGNOSIS — I739 Peripheral vascular disease, unspecified: Secondary | ICD-10-CM | POA: Diagnosis not present

## 2016-03-30 DIAGNOSIS — Z794 Long term (current) use of insulin: Secondary | ICD-10-CM | POA: Insufficient documentation

## 2016-03-30 DIAGNOSIS — L97515 Non-pressure chronic ulcer of other part of right foot with muscle involvement without evidence of necrosis: Secondary | ICD-10-CM

## 2016-03-30 DIAGNOSIS — L97509 Non-pressure chronic ulcer of other part of unspecified foot with unspecified severity: Secondary | ICD-10-CM

## 2016-03-30 LAB — POCT GLYCOSYLATED HEMOGLOBIN (HGB A1C): Hemoglobin A1C: 8.9

## 2016-03-30 LAB — GLUCOSE, POCT (MANUAL RESULT ENTRY): POC Glucose: 252 mg/dl — AB (ref 70–99)

## 2016-03-30 MED ORDER — INSULIN GLARGINE 100 UNIT/ML SOLOSTAR PEN: 30.0000 [IU] | PEN_INJECTOR | Freq: Every day | SUBCUTANEOUS | 3 refills | Status: DC

## 2016-03-30 MED ORDER — PREGABALIN 100 MG PO CAPS: 100.0000 mg | ORAL_CAPSULE | Freq: Two times a day (BID) | ORAL | 3 refills | Status: DC

## 2016-03-30 MED ORDER — AMOXICILLIN 500 MG PO CAPS: 500.0000 mg | ORAL_CAPSULE | Freq: Three times a day (TID) | ORAL | 0 refills | Status: DC

## 2016-03-30 MED FILL — GABAPENTIN 300 MG CAPSULE: 300 | 30 days supply | Qty: 180 | Fill #1

## 2016-03-30 MED FILL — AMOXICILLIN 500 MG CAPSULE: 500 | 10 days supply | Qty: 30 | Fill #0

## 2016-03-30 NOTE — Progress Notes (Signed)
Subjective:  Patient ID: Andrea [name], female    DOB: 1950-04-22  Age: 66 y.o. MRN: [id number]  CC: Diabetes; Nasal Congestion (discharge from nose- blood tinged); Hypertension; and Hyperlipidemia   HPI Andrea [name] is a 66 year old female with a history of uncontrolled type 2 diabetes mellitus (A1c 8.9 down from 9.9 previously), hypertension, peripheral vascular disease who presents today for follow-up visit.  She was recently hospitalized for critical bilateral lower extremity ischemia with right foot 1 status post ultrasound-guided cannulation of left common femoral artery, aortogram with bilateral lower extremity runoff by Dr Donzetta Matters. She is scheduled to see vascular surgery for a follow-up visit tomorrow.  She does have slight pedal edema and a nonhealing right dorsal wound which she sustained following trauma (by her cat scratching her) She also complains of shooting pains in her legs and remains on gabapentin which does not help her symptoms and she is having to take 600 mg 4 times a day.  Complains of sinus pressure for the last 2-3 days, bloody tinged mucous from her nostrils and postnasal drip. Denies fever, shortness of breath or wheezing.    Past Medical History:  Diagnosis Date  . Coronary artery disease   . Diabetes mellitus without complication ([place])   . Fibromyalgia     Past Surgical History:  Procedure Laterality Date  . ABDOMINAL AORTOGRAM W/LOWER EXTREMITY N/A 03/25/2016   Procedure: Abdominal Aortogram w/Lower Extremity;  Surgeon: Waynetta Sandy, MD;  Location: [facility];  Service: Cardiovascular;  Laterality: N/A;  . CORONARY ARTERY BYPASS GRAFT    . KNEE SURGERY      Allergies  Allergen Reactions  . Naproxen   . Tramadol Hcl      Outpatient Medications Prior to Visit  Medication Sig Dispense Refill  . albuterol (PROVENTIL HFA;VENTOLIN HFA) 108 (90 Base) MCG/ACT inhaler Inhale 2 puffs into the lungs every 6 (six) hours as needed  for wheezing or shortness of breath. 54 Inhaler 3  . amitriptyline (ELAVIL) 25 MG tablet Take 1 tablet (25 mg total) by mouth at bedtime. 30 tablet 2  . aspirin EC 81 MG tablet Take 1 tablet (81 mg total) by mouth daily. 90 tablet 3  . atorvastatin (LIPITOR) 80 MG tablet Take 1 tablet (80 mg total) by mouth daily. 90 tablet 3  . Blood Glucose Monitoring Suppl (TRUE METRIX GO GLUCOSE METER) w/Device KIT 1 each by Does not apply route every 8 (eight) hours as needed. 1 kit 0  . carbamide peroxide (DEBROX) 6.5 % otic solution Place 5 drops into the right ear 2 (two) times daily. 15 mL 0  . Fluticasone-Salmeterol (ADVAIR) 100-50 MCG/DOSE AEPB Inhale 1 puff into the lungs 2 (two) times daily. 180 each 3  . gabapentin (NEURONTIN) 300 MG capsule Take 2 capsules (600 mg total) by mouth 3 (three) times daily. 180 capsule 3  . glucose blood (TRUE METRIX BLOOD GLUCOSE TEST) test strip Use as instructed 100 each 12  . hydrochlorothiazide (HYDRODIURIL) 25 MG tablet Take 1 tablet (25 mg total) by mouth daily. 90 tablet 3  . IBUPROFEN PO Take by mouth.    . Insulin Pen Needle (ULTICARE MICRO PEN NEEDLES) 32G X 4 MM MISC 1 applicator by Does not apply route at bedtime. 100 each 11  . lisinopril (PRINIVIL,ZESTRIL) 10 MG tablet Take 1 tablet (10 mg total) by mouth daily. 90 tablet 3  . metFORMIN (GLUCOPHAGE) 1000 MG tablet Take 1 tablet (1,000 mg total) by mouth 2 (two) times daily. [place]  tablet 3  . omeprazole (PRILOSEC) 20 MG capsule Take 1 capsule (20 mg total) by mouth daily. 30 capsule 3  . oxybutynin (DITROPAN) 5 MG tablet Take 1 tablet (5 mg total) by mouth 2 (two) times daily. 60 tablet 2  . TRUEPLUS LANCETS 26G MISC 1 each by Does not apply route every 8 (eight) hours as needed. 100 each 12  . Insulin Glargine (LANTUS SOLOSTAR) 100 UNIT/ML Solostar Pen Inject 25 Units into the skin daily at 10 pm. 30 mL 3  . benzonatate (TESSALON PERLES) 100 MG capsule Take 1 capsule (100 mg total) by mouth 3 (three) times  daily as needed for cough. (Patient not taking: Reported on 03/30/2016) 30 capsule 0  . nicotine (NICODERM CQ) 21 mg/24hr patch Place 1 patch (21 mg total) onto the skin daily. (Patient not taking: Reported on 03/30/2016) 28 patch 0   No facility-administered medications prior to visit.     ROS Review of Systems  Constitutional: Negative for activity change, appetite change and fatigue.  HENT: Positive for congestion, nosebleeds, postnasal drip and sinus pressure. Negative for sore throat.   Eyes: Negative for visual disturbance.  Respiratory: Negative for cough, chest tightness, shortness of breath and wheezing.   Cardiovascular: Negative for chest pain and palpitations.  Gastrointestinal: Negative for abdominal distention, abdominal pain and constipation.  Endocrine: Negative for polydipsia.  Genitourinary: Negative for dysuria and frequency.  Musculoskeletal: Negative for arthralgias and back pain.  Skin: Positive for wound. Negative for rash.  Neurological: Positive for numbness. Negative for tremors and light-headedness.  Hematological: Does not bruise/bleed easily.  Psychiatric/Behavioral: Negative for agitation and behavioral problems.    Objective:  BP (!) 159/65 (BP Location: Right Arm, Patient Position: Sitting, Cuff Size: Small)   Pulse 63   Temp 98.2 F (36.8 C) (Oral)   Ht 5' 2"  (1.575 m)   Wt 145 lb 6.4 oz (66 kg)   SpO2 95%   BMI 26.59 kg/m   BP/Weight 03/30/2016 2/83/1517 06/13/6071  Systolic BP 710 626 948  Diastolic BP 65 47 65  Wt. (Lbs) 145.4 145 145  BMI 26.59 26.52 26.52      Physical Exam Constitutional: She is oriented to person, place, and time. She appears well-developed and well-nourished.  HEENT: Slight maxillary sinus tenderness on the left, right TM occluded by cerumen, left TM normal, normal oropharynx Cardiovascular: Normal rate and normal heart sounds.   No murmur heard. Unable to palpate dorsalis pedis  or posterior tibial; 1+ bilateral  pitting pedal edema Pulmonary/Chest: Effort normal and breath sounds normal. She has no wheezes. She has no rales. She exhibits no tenderness.  Abdominal: Soft. Bowel sounds are normal. She exhibits no distension and no mass. There is no tenderness.  Musculoskeletal: Normal range of motion. She exhibits no edema.  Neurological: She is alert and oriented to person, place, and time.  Skin:  Ulcers on dorsum of right great toe, lateral aspect left leg which are dried out  Lab Results  Component Value Date   HGBA1C 8.9 03/30/2016    Assessment & Plan:   1. DM type 2 with diabetic peripheral neuropathy (HCC) Uncontrolled with A1c of 8.9 Increase Lantus from 25 units to 30 units at bedtime Continue diabetic diet - Glucose (CBG) - HgB A1c - Insulin Glargine (LANTUS SOLOSTAR) 100 UNIT/ML Solostar Pen; Inject 30 Units into the skin daily at 10 pm.  Dispense: 30 mL; Refill: 3  2. Peripheral vascular disease ([place]) Status post PCI Keep appointment with vascular surgeon  tomorrow Risk factor modification Continue statin  3. Other diabetic neurological complication associated with type 2 diabetes mellitus (HCC) Uncontrolled Discontinue Neurontin and commence Lyrica - pregabalin (LYRICA) 100 MG capsule; Take 1 capsule (100 mg total) by mouth 2 (two) times daily.  Dispense: 60 capsule; Refill: 3  4. Essential hypertension Controlled Repeat blood pressure 120/60 manually Low-sodium, DASH diet  5. Diabetic ulcer of right foot associated with type 2 diabetes mellitus, with muscle involvement without evidence of necrosis, unspecified part of foot ([place]) No drainage Stable  6. Acute non-recurrent maxillary sinusitis Treated with amoxicillin  7. Hyperlipidemia Continue statin Low cholesterol diet  Meds ordered this encounter  Medications  . Insulin Glargine (LANTUS SOLOSTAR) 100 UNIT/ML Solostar Pen    Sig: Inject 30 Units into the skin daily at 10 pm.    Dispense:  30 mL    Refill:  3     Discontinue previous dose  . pregabalin (LYRICA) 100 MG capsule    Sig: Take 1 capsule (100 mg total) by mouth 2 (two) times daily.    Dispense:  60 capsule    Refill:  3  . amoxicillin (AMOXIL) 500 MG capsule    Sig: Take 1 capsule (500 mg total) by mouth 3 (three) times daily.    Dispense:  30 capsule    Refill:  0    Follow-up: Return in about 3 months (around 06/30/2016) for Follow-up on chronic medical conditions.   Arnoldo Morale MD

## 2016-03-30 NOTE — Patient Instructions (Signed)
Diabetes Mellitus and Food It is important for you to manage your blood sugar (glucose) level. Your blood glucose level can be greatly affected by what you eat. Eating healthier foods in the appropriate amounts throughout the day at about the same time each day will help you control your blood glucose level. It can also help slow or prevent worsening of your diabetes mellitus. Healthy eating may even help you improve the level of your blood pressure and reach or maintain a healthy weight. General recommendations for healthful eating and cooking habits include:  Eating meals and snacks regularly. Avoid going long periods of time without eating to lose weight.  Eating a diet that consists mainly of plant-based foods, such as fruits, vegetables, nuts, legumes, and whole grains.  Using low-heat cooking methods, such as baking, instead of high-heat cooking methods, such as deep frying.  Work with your dietitian to make sure you understand how to use the Nutrition Facts information on food labels. How can food affect me? Carbohydrates Carbohydrates affect your blood glucose level more than any other type of food. Your dietitian will help you determine how many carbohydrates to eat at each meal and teach you how to count carbohydrates. Counting carbohydrates is important to keep your blood glucose at a healthy level, especially if you are using insulin or taking certain medicines for diabetes mellitus. Alcohol Alcohol can cause sudden decreases in blood glucose (hypoglycemia), especially if you use insulin or take certain medicines for diabetes mellitus. Hypoglycemia can be a life-threatening condition. Symptoms of hypoglycemia (sleepiness, dizziness, and disorientation) are similar to symptoms of having too much alcohol. If your health care provider has given you approval to drink alcohol, do so in moderation and use the following guidelines:  Women should not have more than one drink per day, and men  should not have more than two drinks per day. One drink is equal to: ? 12 oz of beer. ? 5 oz of wine. ? 1 oz of hard liquor.  Do not drink on an empty stomach.  Keep yourself hydrated. Have water, diet soda, or unsweetened iced tea.  Regular soda, juice, and other mixers might contain a lot of carbohydrates and should be counted.  What foods are not recommended? As you make food choices, it is important to remember that all foods are not the same. Some foods have fewer nutrients per serving than other foods, even though they might have the same number of calories or carbohydrates. It is difficult to get your body what it needs when you eat foods with fewer nutrients. Examples of foods that you should avoid that are high in calories and carbohydrates but low in nutrients include:  Trans fats (most processed foods list trans fats on the Nutrition Facts label).  Regular soda.  Juice.  Candy.  Sweets, such as cake, pie, doughnuts, and cookies.  Fried foods.  What foods can I eat? Eat nutrient-rich foods, which will nourish your body and keep you healthy. The food you should eat also will depend on several factors, including:  The calories you need.  The medicines you take.  Your weight.  Your blood glucose level.  Your blood pressure level.  Your cholesterol level.  You should eat a variety of foods, including:  Protein. ? Lean cuts of meat. ? Proteins low in saturated fats, such as fish, egg whites, and beans. Avoid processed meats.  Fruits and vegetables. ? Fruits and vegetables that may help control blood glucose levels, such as apples,   mangoes, and yams.  Dairy products. ? Choose fat-free or low-fat dairy products, such as milk, yogurt, and cheese.  Grains, bread, pasta, and rice. ? Choose whole grain products, such as multigrain bread, whole oats, and brown rice. These foods may help control blood pressure.  Fats. ? Foods containing healthful fats, such as  nuts, avocado, olive oil, canola oil, and fish.  Does everyone with diabetes mellitus have the same meal plan? Because every person with diabetes mellitus is different, there is not one meal plan that works for everyone. It is very important that you meet with a dietitian who will help you create a meal plan that is just right for you. This information is not intended to replace advice given to you by your health care provider. Make sure you discuss any questions you have with your health care provider. Document Released: 09/25/2004 Document Revised: 06/06/2015 Document Reviewed: 11/25/2012 Elsevier Interactive Patient Education  2017 Elsevier Inc.  

## 2016-03-30 NOTE — Progress Notes (Signed)
Taking gabapentin- 2 4 x's daily- needs a refill

## 2016-03-31 ENCOUNTER — Ambulatory Visit (HOSPITAL_COMMUNITY)
Admission: RE | Admit: 2016-03-31 | Discharge: 2016-03-31 | Disposition: A | Discharge status: Home / Self Care | Payer: BLUE CROSS/BLUE SHIELD | Source: Ambulatory Visit | Attending: Vascular Surgery | Admitting: Vascular Surgery

## 2016-03-31 DIAGNOSIS — Z01810 Encounter for preprocedural cardiovascular examination: Secondary | ICD-10-CM

## 2016-03-31 DIAGNOSIS — I739 Peripheral vascular disease, unspecified: Secondary | ICD-10-CM

## 2016-04-01 ENCOUNTER — Encounter (HOSPITAL_COMMUNITY): Payer: Self-pay

## 2016-04-02 ENCOUNTER — Ambulatory Visit (HOSPITAL_COMMUNITY)
Admission: RE | Admit: 2016-04-02 | Discharge: 2016-04-02 | Disposition: A | Discharge status: Home / Self Care | Payer: BLUE CROSS/BLUE SHIELD | Source: Ambulatory Visit | Attending: Vascular Surgery | Admitting: Vascular Surgery

## 2016-04-02 DIAGNOSIS — I6523 Occlusion and stenosis of bilateral carotid arteries: Secondary | ICD-10-CM | POA: Diagnosis not present

## 2016-04-02 DIAGNOSIS — Z01810 Encounter for preprocedural cardiovascular examination: Secondary | ICD-10-CM | POA: Insufficient documentation

## 2016-04-02 LAB — VAS US CAROTID
LEFT ECA DIAS: 0 cm/s
LICADDIAS: -28 cm/s
LICAPDIAS: -112 cm/s
Left CCA dist dias: 12 cm/s
Left CCA dist sys: 64 cm/s
Left CCA prox dias: 15 cm/s
Left CCA prox sys: 124 cm/s
Left ICA dist sys: -106 cm/s
Left ICA prox sys: -376 cm/s
RCCADSYS: -70 cm/s
RCCAPDIAS: 12 cm/s
RIGHT CCA MID DIAS: 15 cm/s
RIGHT ECA DIAS: 0 cm/s
Right CCA prox sys: 85 cm/s

## 2016-04-07 ENCOUNTER — Encounter: Payer: Self-pay | Admitting: Cardiovascular Disease

## 2016-04-07 ENCOUNTER — Ambulatory Visit (INDEPENDENT_AMBULATORY_CARE_PROVIDER_SITE_OTHER): Payer: BLUE CROSS/BLUE SHIELD | Admitting: Cardiovascular Disease

## 2016-04-07 VITALS — BP 158/70 | HR 70 | Ht 62.0 in | Wt 147.4 lb

## 2016-04-07 DIAGNOSIS — I739 Peripheral vascular disease, unspecified: Secondary | ICD-10-CM | POA: Diagnosis not present

## 2016-04-07 DIAGNOSIS — I2510 Atherosclerotic heart disease of native coronary artery without angina pectoris: Secondary | ICD-10-CM

## 2016-04-07 DIAGNOSIS — I779 Disorder of arteries and arterioles, unspecified: Secondary | ICD-10-CM

## 2016-04-07 DIAGNOSIS — I10 Essential (primary) hypertension: Secondary | ICD-10-CM | POA: Diagnosis not present

## 2016-04-07 NOTE — Patient Instructions (Addendum)
Medication Instructions:  Your physician recommends that you continue on your current medications as directed. Please refer to the Current Medication list given to you today.   Labwork: None Ordered   Testing/Procedures: Your physician has requested that you have a lexiscan myoview. For further information please visit [email or web address]. Please follow instruction sheet, as given.  Your physician has requested that you have an echocardiogram. Echocardiography is a painless test that uses sound waves to create images of your heart. It provides your doctor with information about the size and shape of your heart and how well your heart's chambers and valves are working. This procedure takes approximately one hour. There are no restrictions for this procedure.    Follow-Up: Your physician wants you to follow-up in: 6 months with Dr. Elease HashimotoNahser.  You will receive a reminder letter in the mail two months in advance. If you don't receive a letter, please call our office to schedule the follow-up appointment.   If you need a refill on your cardiac medications before your next appointment, please call your pharmacy.   Thank you for choosing [facility]! Eligha BridegroomMichelle Swinyer, RN [phone]

## 2016-04-07 NOTE — Progress Notes (Signed)
Cardiology Office Note   Date:  04/07/2016   ID:  Andrea [name], DOB 1950/02/23, MRN [id number]  PCP:  Arnoldo Morale, MD  Cardiologist:   Mertie Moores, MD   Problem list 1. Peripheral arterial disease 2. Essential hypertension 3. Diabetes mellitus 4. CAD - hx of  stenting 2000 and CABG 2006 ( [place])    Chief Complaint  Patient presents with  . Hypertension      History of Present Illness: Andrea [name] is a 66 y.o. female who presents for pre operative evaluation prior to possible PV surgery .   She has some DOE walking up a hill or up stairs.  Has had CABG and a previous stent   Quit smoking a month ago .  Has not had any angina  Does not get regular exercise - is severely limited by leg pain .  She's had lots of issues with her peripheral arterial disease. She has several nonhealing wounds on her legs. She's had an angiogram by Dr. Donzetta Matters and he has set up this cardiology assessment. I'm assuming that this is for preoperative evaluation.   Past Medical History:  Diagnosis Date  . Coronary artery disease   . Diabetes mellitus without complication ([place])   . Fibromyalgia     Past Surgical History:  Procedure Laterality Date  . ABDOMINAL AORTOGRAM W/LOWER EXTREMITY N/A 03/25/2016   Procedure: Abdominal Aortogram w/Lower Extremity;  Surgeon: Waynetta Sandy, MD;  Location: [facility];  Service: Cardiovascular;  Laterality: N/A;  . CORONARY ARTERY BYPASS GRAFT    . KNEE SURGERY       Current Outpatient Prescriptions  Medication Sig Dispense Refill  . albuterol (PROVENTIL HFA;VENTOLIN HFA) 108 (90 Base) MCG/ACT inhaler Inhale 2 puffs into the lungs every 6 (six) hours as needed for wheezing or shortness of breath. 54 Inhaler 3  . amitriptyline (ELAVIL) 25 MG tablet Take 1 tablet (25 mg total) by mouth at bedtime. 30 tablet 2  . amoxicillin (AMOXIL) 500 MG capsule Take 1 capsule (500 mg total) by mouth 3 (three) times daily. 30 capsule 0  .  aspirin EC 81 MG tablet Take 1 tablet (81 mg total) by mouth daily. 90 tablet 3  . atorvastatin (LIPITOR) 80 MG tablet Take 1 tablet (80 mg total) by mouth daily. 90 tablet 3  . Blood Glucose Monitoring Suppl (TRUE METRIX GO GLUCOSE METER) w/Device KIT 1 each by Does not apply route every 8 (eight) hours as needed. 1 kit 0  . carbamide peroxide (DEBROX) 6.5 % otic solution Place 5 drops into the right ear 2 (two) times daily. 15 mL 0  . Fluticasone-Salmeterol (ADVAIR) 100-50 MCG/DOSE AEPB Inhale 1 puff into the lungs 2 (two) times daily. 180 each 3  . gabapentin (NEURONTIN) 300 MG capsule Take 300 mg by mouth 3 (three) times daily.  3  . glucose blood (TRUE METRIX BLOOD GLUCOSE TEST) test strip Use as instructed 100 each 12  . hydrochlorothiazide (HYDRODIURIL) 25 MG tablet Take 1 tablet (25 mg total) by mouth daily. 90 tablet 3  . IBUPROFEN PO Take by mouth.    . Insulin Glargine (LANTUS SOLOSTAR) 100 UNIT/ML Solostar Pen Inject 30 Units into the skin daily at 10 pm. 30 mL 3  . Insulin Pen Needle (ULTICARE MICRO PEN NEEDLES) 32G X 4 MM MISC 1 applicator by Does not apply route at bedtime. 100 each 11  . lisinopril (PRINIVIL,ZESTRIL) 10 MG tablet Take 1 tablet (10 mg total) by mouth daily. 90 tablet  3  . metFORMIN (GLUCOPHAGE) 1000 MG tablet Take 1 tablet (1,000 mg total) by mouth 2 (two) times daily. 60 tablet 3  . nicotine (NICODERM CQ) 21 mg/24hr patch Place 1 patch (21 mg total) onto the skin daily. 28 patch 0  . omeprazole (PRILOSEC) 20 MG capsule Take 1 capsule (20 mg total) by mouth daily. 30 capsule 3  . oxybutynin (DITROPAN) 5 MG tablet Take 1 tablet (5 mg total) by mouth 2 (two) times daily. 60 tablet 2  . pregabalin (LYRICA) 100 MG capsule Take 1 capsule (100 mg total) by mouth 2 (two) times daily. 60 capsule 3  . TRUEPLUS LANCETS 26G MISC 1 each by Does not apply route every 8 (eight) hours as needed. 100 each 12   No current facility-administered medications for this visit.     No  flowsheet data found.    Allergies:   Naproxen and Tramadol hcl    Social History:  The patient  reports that she has quit smoking. Her smoking use included Cigarettes. She smoked 0.25 packs per day. She has never used smokeless tobacco. She reports that she does not drink alcohol or use drugs.   Family History:  The patient's family history includes Stroke in her mother.    ROS:  Please see the history of present illness.    Review of Systems: Constitutional:  denies fever, chills, diaphoresis, appetite change and fatigue.  HEENT: denies photophobia, eye pain, redness, hearing loss, ear pain, congestion, sore throat, rhinorrhea, sneezing, neck pain, neck stiffness and tinnitus.  Respiratory: denies SOB, DOE, cough, chest tightness, and wheezing.  Cardiovascular: denies chest pain, palpitations and leg swelling.  Gastrointestinal: denies nausea, vomiting, abdominal pain, diarrhea, constipation, blood in stool.  Genitourinary: denies dysuria, urgency, frequency, hematuria, flank pain and difficulty urinating.  Musculoskeletal: denies  myalgias, back pain, joint swelling, arthralgias and gait problem.   Skin: denies pallor, rash and wound.  Neurological: denies dizziness, seizures, syncope, weakness, light-headedness, numbness and headaches.   Hematological: denies adenopathy, easy bruising, personal or family bleeding history.  Psychiatric/ Behavioral: denies suicidal ideation, mood changes, confusion, nervousness, sleep disturbance and agitation.       All other systems are reviewed and negative.    PHYSICAL EXAM: VS:  BP (!) 158/70 (BP Location: Right Arm, Patient Position: Sitting, Cuff Size: Normal)   Pulse 70   Ht 5' 2"  (1.575 m)   Wt 147 lb 6.4 oz (66.9 kg)   SpO2 94%   BMI 26.96 kg/m  , BMI Body mass index is 26.96 kg/m. GEN:  Chronically ill appearing ,middle aged female,  HEENT: normal  Neck: no JVD, loud left carotid bruit, or masses Cardiac: RR;  Soft systolic   murmur, no rubs, or gallops,no edema  Respiratory:  clear to auscultation bilaterally, normal work of breathing GI: soft, nontender, nondistended, + BS MS: no deformity or atrophy  Skin: warm and dry, no rash Neuro:  Strength and sensation are intact Psych: normal  EKG:  Personally reviewed   March 24, 2016:   NSR , lateral TWI.     Recent Labs: 09/25/2015: Platelets 244 10/16/2015: TSH 0.60 03/25/2016: BUN 20; Creatinine, Ser 0.50; Hemoglobin 13.6; Potassium 4.3; Sodium 141    Lipid Panel    Component Value Date/Time   CHOL 309 (H) 10/16/2015 1200   TRIG 258 (H) 10/16/2015 1200   HDL 56 10/16/2015 1200   CHOLHDL 5.5 (H) 10/16/2015 1200   VLDL 52 (H) 10/16/2015 1200   LDLCALC 201 (H) 10/16/2015 1200  Wt Readings from Last 3 Encounters:  04/07/16 147 lb 6.4 oz (66.9 kg)  03/30/16 145 lb 6.4 oz (66 kg)  03/25/16 145 lb (65.8 kg)      Other studies Reviewed: Additional studies/ records that were reviewed today include: . Review of the above records demonstrates:    ASSESSMENT AND PLAN:  1.  CAD:   Hx of stenting of LCx in 2000 and hx of CABG in 2006.   No angina .   Has had some DOE but has a long hx of cigarette smoking.    She potentially needs an extensive PV surgery . Will get an echo and Lexiscan Myoview for further assessment.    She has not been here in many years.   2. PAD :  Has nonhealing wounds on both legs.   Further eval and management per Dr. Donzetta Matters.   Current medicines are reviewed at length with the patient today.  The patient does not have concerns regarding medicines.  Labs/ tests ordered today include:  No orders of the defined types were placed in this encounter.    Disposition:   FU with me in 6 months      Mertie Moores, MD  04/07/2016 3:00 PM    [address], [place], [place]  [address] Phone: [phone]; Fax: [phone]

## 2016-04-08 ENCOUNTER — Other Ambulatory Visit: Payer: Self-pay

## 2016-04-08 ENCOUNTER — Encounter: Payer: Self-pay | Admitting: Vascular Surgery

## 2016-04-08 ENCOUNTER — Ambulatory Visit (INDEPENDENT_AMBULATORY_CARE_PROVIDER_SITE_OTHER): Payer: BLUE CROSS/BLUE SHIELD | Admitting: Vascular Surgery

## 2016-04-08 VITALS — BP 130/62 | HR 76 | Temp 98.5°F | Resp 18 | Ht 62.0 in | Wt 146.0 lb

## 2016-04-08 DIAGNOSIS — I739 Peripheral vascular disease, unspecified: Secondary | ICD-10-CM

## 2016-04-08 NOTE — Progress Notes (Signed)
Patient ID: Andrea [name], female   DOB: Nov 12, 1950, 66 y.o.   MRN: [id number]  Reason for Consult: Follow-up (pt had Vascular Labs on 04-02-16, Saw Dr. Acie Fredrickson yesterday but he has ordered an Echo and [place] which are scheduled for 04-23-16)   Referred by Arnoldo Morale, MD  Subjective:     HPI:  Andrea [name] is a 66 y.o. female follows up from recent angiogram which demonstrated bilateral superficial femoral artery occlusions. She does have wound on the right foot as well as wounds on her left leg. She has a significant cardiac history having had bypass in the past. She states that she did quit smoking 30 days ago. She is taking aspirin and statin. She has never had stroke TIA or amaurosis. She presents today for discussion of lower extremity bypass. She had vein mapping as well as carotid Dopplers prior to this.  Past Medical History:  Diagnosis Date  . Coronary artery disease   . Diabetes mellitus without complication ([place])   . Fibromyalgia    Family History  Problem Relation Age of Onset  . Stroke Mother    Past Surgical History:  Procedure Laterality Date  . ABDOMINAL AORTOGRAM W/LOWER EXTREMITY N/A 03/25/2016   Procedure: Abdominal Aortogram w/Lower Extremity;  Surgeon: Waynetta Sandy, MD;  Location: [place] [facility];  Service: Cardiovascular;  Laterality: N/A;  . CORONARY ARTERY BYPASS GRAFT    . KNEE SURGERY      Short Social History:  Social History  Substance Use Topics  . Smoking status: Former Smoker    Packs/day: 0.25    Types: Cigarettes  . Smokeless tobacco: Never Used     Comment: last cig one week ago  . Alcohol use No    Allergies  Allergen Reactions  . Tramadol Hcl Itching  . Naproxen Itching    Current Outpatient Prescriptions  Medication Sig Dispense Refill  . albuterol (PROVENTIL HFA;VENTOLIN HFA) 108 (90 Base) MCG/ACT inhaler Inhale 2 puffs into the lungs every 6 (six) hours as needed for wheezing or shortness of breath. 54  Inhaler 3  . amitriptyline (ELAVIL) 25 MG tablet Take 1 tablet (25 mg total) by mouth at bedtime. 30 tablet 2  . amoxicillin (AMOXIL) 500 MG capsule Take 1 capsule (500 mg total) by mouth 3 (three) times daily. 30 capsule 0  . aspirin EC 81 MG tablet Take 1 tablet (81 mg total) by mouth daily. 90 tablet 3  . atorvastatin (LIPITOR) 80 MG tablet Take 1 tablet (80 mg total) by mouth daily. 90 tablet 3  . Blood Glucose Monitoring Suppl (TRUE METRIX GO GLUCOSE METER) w/Device KIT 1 each by Does not apply route every 8 (eight) hours as needed. 1 kit 0  . carbamide peroxide (DEBROX) 6.5 % otic solution Place 5 drops into the right ear 2 (two) times daily. 15 mL 0  . Fluticasone-Salmeterol (ADVAIR) 100-50 MCG/DOSE AEPB Inhale 1 puff into the lungs 2 (two) times daily. 180 each 3  . gabapentin (NEURONTIN) 300 MG capsule Take 300 mg by mouth 3 (three) times daily.  3  . glucose blood (TRUE METRIX BLOOD GLUCOSE TEST) test strip Use as instructed 100 each 12  . hydrochlorothiazide (HYDRODIURIL) 25 MG tablet Take 1 tablet (25 mg total) by mouth daily. 90 tablet 3  . IBUPROFEN PO Take by mouth.    . Insulin Glargine (LANTUS SOLOSTAR) 100 UNIT/ML Solostar Pen Inject 30 Units into the skin daily at 10 pm. 30 mL 3  . Insulin Pen Needle (  ULTICARE MICRO PEN NEEDLES) 32G X 4 MM MISC 1 applicator by Does not apply route at bedtime. 100 each 11  . lisinopril (PRINIVIL,ZESTRIL) 10 MG tablet Take 1 tablet (10 mg total) by mouth daily. 90 tablet 3  . metFORMIN (GLUCOPHAGE) 1000 MG tablet Take 1 tablet (1,000 mg total) by mouth 2 (two) times daily. 60 tablet 3  . nicotine (NICODERM CQ) 21 mg/24hr patch Place 1 patch (21 mg total) onto the skin daily. 28 patch 0  . omeprazole (PRILOSEC) 20 MG capsule Take 1 capsule (20 mg total) by mouth daily. 30 capsule 3  . oxybutynin (DITROPAN) 5 MG tablet Take 1 tablet (5 mg total) by mouth 2 (two) times daily. 60 tablet 2  . pregabalin (LYRICA) 100 MG capsule Take 1 capsule (100 mg  total) by mouth 2 (two) times daily. 60 capsule 3  . TRUEPLUS LANCETS 26G MISC 1 each by Does not apply route every 8 (eight) hours as needed. 100 each 12   No current facility-administered medications for this visit.     Review of Systems  Constitutional:  Constitutional negative. Eyes: Eyes negative.  Respiratory: Positive for shortness of breath.  Cardiovascular: Positive for claudication and leg swelling.  GI: Gastrointestinal negative.  Musculoskeletal: Positive for leg pain.  Skin: Positive for wound.  Neurological: Neurological negative. Hematologic: Hematologic/lymphatic negative.  Psychiatric: Psychiatric negative.        Objective:  Objective   Vitals:   04/08/16 1141  BP: 130/62  Pulse: 76  Resp: 18  Temp: 98.5 F (36.9 C)  TempSrc: Oral  SpO2: 95%  Weight: 146 lb (66.2 kg)  Height: _0  (1.575 m)   Body mass index is 26.7 kg/m.  Physical Exam  Constitutional: She is oriented to person, place, and time. She appears well-developed.  HENT:  Head: Normocephalic.  Eyes: Pupils are equal, round, and reactive to light.  Neck: Normal range of motion.  Cardiovascular:  Pulses:      Carotid pulses are 2+ on the right side, and 2+ on the left side.      Femoral pulses are 2+ on the right side, and 2+ on the left side. Abdominal: Soft. She exhibits no mass.  Musculoskeletal: Normal range of motion.  Neurological: She is alert and oriented to person, place, and time.  Skin:  1cm ulcer with dry eschar on dorsum right foot, ulceration of left leg Psychiatric: She has a normal mood and affect. Her behavior is normal. Judgment and thought content normal.    Data: Vein mapping today was reviewed which did not demonstrate suitable vein on either the right or the left leg for bypass.  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      I reviewed her angiogram as well today which  demonstrates bilateral flush SFA occlusions reconstituting her SFAs distally. The right side which is the area of most concern at this time had a popliteal lesion and would be suitable for right femoral to below-knee popliteal artery bypass. Assessment/Plan:   66 year old female presents with wound on her right foot as well as left leg and severely depressed ABIs with angiogram demonstrating bilateral SFA occlusions. She will need bilateral femoral to popliteal artery bypasses starting with the right. She also does not have suitable vein for bypass so will likely require graft although we can look at the vein in the operating room with ultrasound prior to procedure. 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 Given her significant coronary history and the length of time she has had the wound on her right foot I think it is prudent to get cardiac clearance and we will do that expediently. She'll be scheduled for right femoral to below-knee popliteal bypass with likely graft today. We discussed the risks of bleeding infection graft clotting possible need to be on blood thinners possible myocardial infarction and specifically risk of stroke with her high-grade carotid disease. She is tearful with this discussion but demonstrates good understanding and understands she will need further procedures for left leg bypass likely as well as left carotid endarterectomy in the future. She also understands a very high likelihood of future amputation of either leg should we have complications.       Waynetta Sandy MD [facility]

## 2016-04-13 ENCOUNTER — Other Ambulatory Visit: Payer: Self-pay

## 2016-04-13 ENCOUNTER — Telehealth: Payer: Self-pay | Admitting: Cardiovascular Disease

## 2016-04-13 ENCOUNTER — Ambulatory Visit (HOSPITAL_COMMUNITY): Payer: BLUE CROSS/BLUE SHIELD | Attending: Cardiovascular Disease

## 2016-04-13 ENCOUNTER — Ambulatory Visit (HOSPITAL_BASED_OUTPATIENT_CLINIC_OR_DEPARTMENT_OTHER): Payer: BLUE CROSS/BLUE SHIELD

## 2016-04-13 DIAGNOSIS — I779 Disorder of arteries and arterioles, unspecified: Secondary | ICD-10-CM | POA: Insufficient documentation

## 2016-04-13 DIAGNOSIS — R079 Chest pain, unspecified: Secondary | ICD-10-CM | POA: Diagnosis not present

## 2016-04-13 DIAGNOSIS — I2510 Atherosclerotic heart disease of native coronary artery without angina pectoris: Secondary | ICD-10-CM | POA: Insufficient documentation

## 2016-04-13 DIAGNOSIS — E119 Type 2 diabetes mellitus without complications: Secondary | ICD-10-CM | POA: Insufficient documentation

## 2016-04-13 DIAGNOSIS — R9439 Abnormal result of other cardiovascular function study: Secondary | ICD-10-CM | POA: Insufficient documentation

## 2016-04-13 DIAGNOSIS — Z951 Presence of aortocoronary bypass graft: Secondary | ICD-10-CM | POA: Insufficient documentation

## 2016-04-13 DIAGNOSIS — I119 Hypertensive heart disease without heart failure: Secondary | ICD-10-CM | POA: Insufficient documentation

## 2016-04-13 DIAGNOSIS — R0609 Other forms of dyspnea: Secondary | ICD-10-CM | POA: Insufficient documentation

## 2016-04-13 LAB — MYOCARDIAL PERFUSION IMAGING
CHL CUP NUCLEAR SSS: 11
CHL CUP RESTING HR STRESS: 71 {beats}/min
LV sys vol: 51 mL
LVDIAVOL: 106 mL (ref 46–106)
NUC STRESS TID: 1.07
Peak HR: 96 {beats}/min
RATE: 0.26
SDS: 5
SRS: 6

## 2016-04-13 MED ORDER — TECHNETIUM TC 99M TETROFOSMIN IV KIT
10.2000 | PACK | Freq: Once | INTRAVENOUS | Status: AC | PRN
  Administered 2016-04-13: 10.2 via INTRAVENOUS
  Filled 2016-04-13: qty 11

## 2016-04-13 MED ORDER — TECHNETIUM TC 99M TETROFOSMIN IV KIT
30.0000 | PACK | Freq: Once | INTRAVENOUS | Status: AC | PRN
  Administered 2016-04-13: 30 via INTRAVENOUS
  Filled 2016-04-13: qty 30

## 2016-04-13 MED ORDER — REGADENOSON 0.4 MG/5ML IV SOLN
0.4000 mg | Freq: Once | INTRAVENOUS | Status: AC
  Administered 2016-04-13: 0.4 mg via INTRAVENOUS

## 2016-04-13 NOTE — Telephone Encounter (Signed)
I do not see step daughter [name] on [place], I have left a message at pt's home number for pt to call the office,

## 2016-04-13 NOTE — Telephone Encounter (Signed)
New message   Pt step daughter is calling to find out results. She also has a question about pt being scheduled for surgery.

## 2016-04-14 ENCOUNTER — Ambulatory Visit (INDEPENDENT_AMBULATORY_CARE_PROVIDER_SITE_OTHER): Payer: BLUE CROSS/BLUE SHIELD | Admitting: Cardiology

## 2016-04-14 ENCOUNTER — Encounter: Payer: Self-pay | Admitting: *Deleted

## 2016-04-14 ENCOUNTER — Encounter: Payer: Self-pay | Admitting: Cardiology

## 2016-04-14 ENCOUNTER — Other Ambulatory Visit: Payer: Self-pay | Admitting: Family Medicine

## 2016-04-14 VITALS — BP 118/64 | HR 80 | Ht 62.0 in | Wt 144.0 lb

## 2016-04-14 DIAGNOSIS — K219 Gastro-esophageal reflux disease without esophagitis: Secondary | ICD-10-CM

## 2016-04-14 DIAGNOSIS — R9439 Abnormal result of other cardiovascular function study: Secondary | ICD-10-CM | POA: Diagnosis not present

## 2016-04-14 DIAGNOSIS — N3281 Overactive bladder: Secondary | ICD-10-CM

## 2016-04-14 MED FILL — ATORVASTATIN 80 MG TABLET: 80 | 30 days supply | Qty: 30 | Fill #5

## 2016-04-14 MED FILL — metFORMIN HCL 1000 MG TABS: 1000 | 30 days supply | Qty: 60 | Fill #2

## 2016-04-14 MED FILL — HYDROCHLOROTHIAZIDE 25 MG T: 25 | 30 days supply | Qty: 30 | Fill #3

## 2016-04-14 MED FILL — LANTUS SOLOSTAR 100 UNITS/M: 100 | 30 days supply | Qty: 9 | Fill #3

## 2016-04-14 MED FILL — OXYBUTYNIN 5 MG TABLET: 5 | 30 days supply | Qty: 60 | Fill #0

## 2016-04-14 MED FILL — LISINOPRIL 10 MG TABLET: 10 | 30 days supply | Qty: 30 | Fill #4

## 2016-04-14 NOTE — Telephone Encounter (Signed)
I spoke with pt and reviewed echo and stress test results with her.  I scheduled her to see Robbie Lis, PA today at 2:00

## 2016-04-14 NOTE — Progress Notes (Signed)
04/14/2016 Andrea [name]   12/13/1950  [id number]  Primary Physician Arnoldo Morale, MD Primary Cardiologist: Dr. Cathie Olden   Reason for Visit/CC: F/u for Abnormal NST   HPI:  Andrea [name] presents back to clinic today, as advised by Dr. Acie Fredrickson, to discuss indication for [facility], given recent abnormal NST. She is a 66 y/o female with a h/o CAD - hx of  stenting 2000 and CABG 2006 (Dr. Cyndia Bent), PVD, HTN and DM. She was recently seen by Dr. Acie Fredrickson on 04/07/16 for pre-operative assessment/clearance prior to anticipated below the knee popliteal bypass for PVD, which is tentatively scheduled for 04/20/16 with Dr. Donzetta Matters. As part of her evaluation, Dr. Cathie Olden order a NST. This demonstrated a medium size, moderate intensity reversible (SDS 5) mid-anterior, anteroseptal and anterolateral perfusion defect suggestive of ischemia. LVEF 52% with normal wall motion. She will need definitive LHC prior to clearance.   She is asymptomatic in clinic today. She denies chest pain and dyspnea. VSS. All questions and concerns were addressed.    Study Highlights     Nuclear stress EF: 52%.  No T wave inversion was noted during stress.  Downsloping ST segment depression ST segment depression of 2 mm was noted during stress in the II, III, aVF, V6, V5 and V4 leads.  Defect 1: There is a medium defect of moderate severity.  Findings consistent with ischemia.  This is an intermediate risk study.   Medium size, moderate intensity reversible (SDS 5) mid-anterior, anteroseptal and anterolateral perfusion defect suggestive of ischemia. LVEF 52% with normal wall motion. This is an intermediate risk study.     Current Meds  Medication Sig  . albuterol (PROVENTIL HFA;VENTOLIN HFA) 108 (90 Base) MCG/ACT inhaler Inhale 2 puffs into the lungs every 6 (six) hours as needed for wheezing or shortness of breath.  Marland Kitchen aspirin EC 81 MG tablet Take 1 tablet (81 mg total) by mouth daily.  Marland Kitchen atorvastatin (LIPITOR) 80 MG tablet Take  1 tablet (80 mg total) by mouth daily.  . Blood Glucose Monitoring Suppl (TRUE METRIX GO GLUCOSE METER) w/Device KIT 1 each by Does not apply route every 8 (eight) hours as needed.  . carbamide peroxide (DEBROX) 6.5 % otic solution Place 5 drops into both ears as needed (WAX BUILDUP).  . Fluticasone-Salmeterol (ADVAIR) 100-50 MCG/DOSE AEPB Inhale 1 puff into the lungs 2 (two) times daily.  Marland Kitchen gabapentin (NEURONTIN) 300 MG capsule Take 600 mg by mouth 3 (three) times daily.   Marland Kitchen glucose blood (TRUE METRIX BLOOD GLUCOSE TEST) test strip Use as instructed  . hydrochlorothiazide (HYDRODIURIL) 25 MG tablet Take 1 tablet (25 mg total) by mouth daily.  . Insulin Glargine (LANTUS SOLOSTAR) 100 UNIT/ML Solostar Pen Inject 30 Units into the skin daily at 10 pm.  . Insulin Pen Needle (ULTICARE MICRO PEN NEEDLES) 32G X 4 MM MISC 1 applicator by Does not apply route at bedtime.  Marland Kitchen lisinopril (PRINIVIL,ZESTRIL) 10 MG tablet Take 1 tablet (10 mg total) by mouth daily.  . metFORMIN (GLUCOPHAGE) 1000 MG tablet Take 1 tablet (1,000 mg total) by mouth 2 (two) times daily.  Marland Kitchen omeprazole (PRILOSEC) 20 MG capsule TAKE 1 CAPSULE BY MOUTH DAILY.  Marland Kitchen oxybutynin (DITROPAN) 5 MG tablet TAKE 1 TABLET BY MOUTH 2 TIMES DAILY.  Marland Kitchen pregabalin (LYRICA) 100 MG capsule Take 1 capsule (100 mg total) by mouth 2 (two) times daily.  . TRUEPLUS LANCETS 26G MISC 1 each by Does not apply route every 8 (eight) hours as needed.  . [DISCONTINUED]  amoxicillin (AMOXIL) 500 MG capsule Take 1 capsule (500 mg total) by mouth 3 (three) times daily.  . [DISCONTINUED] carbamide peroxide (DEBROX) 6.5 % otic solution Place 5 drops into the right ear 2 (two) times daily. (Patient taking differently: Place 5 drops into both ears daily as needed (ear wax). )  . [DISCONTINUED] nicotine (NICODERM CQ) 21 mg/24hr patch Place 1 patch (21 mg total) onto the skin daily.   Allergies  Allergen Reactions  . Tramadol Hcl Itching  . Naproxen Itching   Past Medical  History:  Diagnosis Date  . Coronary artery disease   . Diabetes mellitus without complication ([place])   . Fibromyalgia    Family History  Problem Relation Age of Onset  . Stroke Mother    Past Surgical History:  Procedure Laterality Date  . ABDOMINAL AORTOGRAM W/LOWER EXTREMITY N/A 03/25/2016   Procedure: Abdominal Aortogram w/Lower Extremity;  Surgeon: Waynetta Sandy, MD;  Location: [place] [facility];  Service: Cardiovascular;  Laterality: N/A;  . CORONARY ARTERY BYPASS GRAFT    . KNEE SURGERY     Social History   Social History  . Marital status: Widowed    Spouse name: N/A  . Number of children: N/A  . Years of education: N/A   Occupational History  . Not on file.   Social History Main Topics  . Smoking status: Former Smoker    Packs/day: 0.25    Types: Cigarettes  . Smokeless tobacco: Never Used     Comment: last cig one week ago  . Alcohol use No  . Drug use: No  . Sexual activity: Not on file   Other Topics Concern  . Not on file   Social History Narrative  . No narrative on file     Review of Systems: General: negative for chills, fever, night sweats or weight changes.  Cardiovascular: negative for chest pain, dyspnea on exertion, edema, orthopnea, palpitations, paroxysmal nocturnal dyspnea or shortness of breath Dermatological: negative for rash Respiratory: negative for cough or wheezing Urologic: negative for hematuria Abdominal: negative for nausea, vomiting, diarrhea, bright red blood per rectum, melena, or hematemesis Neurologic: negative for visual changes, syncope, or dizziness All other systems reviewed and are otherwise negative except as noted above.   Physical Exam:  Blood pressure 118/64, pulse 80, height _0  (1.575 m), weight 144 lb (65.3 kg), SpO2 96 %.  General appearance: alert, cooperative and no distress Neck: no carotid bruit and no JVD Lungs: clear to auscultation bilaterally Heart: regular rate and rhythm, S1, S2  normal, no murmur, click, rub or gallop Extremities: extremities normal, atraumatic, no cyanosis or edema Pulses: 2+ and symmetric Skin: Skin color, texture, turgor normal. No rashes or lesions Neurologic: Grossly normal  EKG not performed.   ASSESSMENT AND PLAN:   1. CAD/ Abnormal NST: hx of  stenting 2000 and CABG 2006 (Dr. Cyndia Bent). Recent NST demonstrated a medium size, moderate intensity reversible (SDS 5) mid-anterior, anteroseptal and anterolateral perfusion defect suggestive of ischemia. LVEF 52% with normal wall motion. She will need definitive LHC prior to surgical clearance. She denies any active CP or dyspnea.   2. Pre-operative Risk Assessment/Cardiac Clearance: Clearance is on hold until complete w/u is conducted. Further recs pending, once LHC is completed.   3. PVD: tentatively scheduled for below the knee popliteal bypass for PVD. Awaiting pre-operative w/u and clearance.   4. HTN: controlled on current regimen.   5. DM: followed by PCP.    PLAN  We discussed indication  for LHC, as well as procedural details and risk which include but are not limited to death, MI, need for emergency CABG, CVA, bleeding, vascular injury, loss of limb, nephrotoxicity and allergic reaction. The patient understands these risk and agrees to proceed with cardiac catheterization. We will arrange for study to be completed later this week a [facility]. We will obtain pre-cath labs, CBC, BMP and INR. Hold metformin. lisinopril and HCTZ morning of procedure.    Lyda Jester PA-C 04/14/2016 2:29 PM

## 2016-04-14 NOTE — Patient Instructions (Signed)
Medication Instructions:  Your physician recommends that you continue on your current medications as directed. Please refer to the Current Medication list given to you today.   Labwork: TODAY:  BMET, CBC, & PT/INR  Testing/Procedures: Your physician has requested that you have a cardiac catheterization. Cardiac catheterization is used to diagnose and/or treat various heart conditions. Doctors may recommend this procedure for a number of different reasons. The most common reason is to evaluate chest pain. Chest pain can be a symptom of coronary artery disease (CAD), and cardiac catheterization can show whether plaque is narrowing or blocking your heart's arteries. This procedure is also used to evaluate the valves, as well as measure the blood flow and oxygen levels in different parts of your heart. For further information please visit www.cardiosmart.org. Please follow instruction sheet, as given.  Follow-Up: Your physician recommends that you schedule a follow-up appointment in: WILL BE SET UP AT DISCHARGE   Any Other Special Instructions Will Be Listed Below (If Applicable).   Coronary Angiogram With Stent Coronary angiogram with stent placement is a procedure to widen or open a narrow blood vessel of the heart (coronary artery). Arteries may become blocked by cholesterol buildup (plaques) in the lining or wall. When a coronary artery becomes partially blocked, blood flow to that area decreases. This may lead to chest pain or a heart attack (myocardial infarction). A stent is a small piece of metal that looks like mesh or a spring. Stent placement may be done as treatment for a heart attack or right after a coronary angiogram in which a blocked artery is found. Let your health care provider know about:  Any allergies you have.  All medicines you are taking, including vitamins, herbs, eye drops, creams, and over-the-counter medicines.  Any problems you or family members have had with  anesthetic medicines.  Any blood disorders you have.  Any surgeries you have had.  Any medical conditions you have.  Whether you are pregnant or may be pregnant. What are the risks? Generally, this is a safe procedure. However, problems may occur, including:  Damage to the heart or its blood vessels.  A return of blockage.  Bleeding, infection, or bruising at the insertion site.  A collection of blood under the skin (hematoma) at the insertion site.  A blood clot in another part of the body.  Kidney injury.  Allergic reaction to the dye or contrast that is used.  Bleeding into the abdomen (retroperitoneal bleeding). What happens before the procedure? Staying hydrated  Follow instructions from your health care provider about hydration, which may include:  Up to 2 hours before the procedure - you may continue to drink clear liquids, such as water, clear fruit juice, black coffee, and plain tea. Eating and drinking restrictions  Follow instructions from your health care provider about eating and drinking, which may include:  8 hours before the procedure - stop eating heavy meals or foods such as meat, fried foods, or fatty foods.  6 hours before the procedure - stop eating light meals or foods, such as toast or cereal.  2 hours before the procedure - stop drinking clear liquids. Ask your health care provider about:  Changing or stopping your regular medicines. This is especially important if you are taking diabetes medicines or blood thinners.  Taking medicines such as ibuprofen. These medicines can thin your blood. Do not take these medicines before your procedure if your health care provider instructs you not to. Generally, aspirin is recommended before a   procedure of passing a small, thin tube (catheter) through a blood vessel and into the heart (cardiac catheterization). What happens during the procedure?  An IV tube will be inserted into one of your veins.  You will  be given one or more of the following:  A medicine to help you relax (sedative).  A medicine to numb the area where the catheter will be inserted into an artery (local anesthetic).  To reduce your risk of infection:  Your health care team will wash or sanitize their hands.  Your skin will be washed with soap.  Hair may be removed from the area where the catheter will be inserted.  Using a guide wire, the catheter will be inserted into an artery. The location may be in your groin, in your wrist, or in the fold of your arm (near your elbow).  A type of X-ray (fluoroscopy) will be used to help guide the catheter to the opening of the arteries in the heart.  A dye will be injected into the catheter, and X-rays will be taken. The dye will help to show where any narrowing or blockages are located in the arteries.  A tiny wire will be guided to the blocked spot, and a balloon will be inflated to make the artery wider.  The stent will be expanded and will crush the plaques into the wall of the vessel. The stent will hold the area open and improve the blood flow. Most stents have a drug coating to reduce the risk of the stent narrowing over time.  The artery may be made wider using a drill, laser, or other tools to remove plaques.  When the blood flow is better, the catheter will be removed. The lining of the artery will grow over the stent, which stays where it was placed. This procedure may vary among health care providers and hospitals. What happens after the procedure?  If the procedure is done through the leg, you will be kept in bed lying flat for about 6 hours. You will be instructed to not bend and not cross your legs.  The insertion site will be checked frequently.  The pulse in your foot or wrist will be checked frequently.  You may have additional blood tests, X-rays, and a test that records the electrical activity of your heart (electrocardiogram, or ECG). This information is  not intended to replace advice given to you by your health care provider. Make sure you discuss any questions you have with your health care provider. Document Released: 07/05/2002 Document Revised: 08/29/2015 Document Reviewed: 08/04/2015 Elsevier Interactive Patient Education  2017 [employer]vier Inc.    If you need a refill on your cardiac medications before your next appointment, please call your pharmacy.   

## 2016-04-14 NOTE — Telephone Encounter (Signed)
New message    pt verbalized that she is calling to speak to rn  For results

## 2016-04-15 ENCOUNTER — Ambulatory Visit (HOSPITAL_COMMUNITY)
Admission: RE | Admit: 2016-04-15 | Discharge: 2016-04-16 | Disposition: A | Discharge status: Home / Self Care | Payer: BLUE CROSS/BLUE SHIELD | Source: Ambulatory Visit | Attending: Cardiovascular Disease | Admitting: Cardiovascular Disease

## 2016-04-15 ENCOUNTER — Other Ambulatory Visit: Payer: Self-pay

## 2016-04-15 ENCOUNTER — Encounter (HOSPITAL_COMMUNITY)
Admission: RE | Disposition: A | Discharge status: Home / Self Care | Payer: Self-pay | Source: Ambulatory Visit | Attending: Cardiovascular Disease

## 2016-04-15 DIAGNOSIS — Z87891 Personal history of nicotine dependence: Secondary | ICD-10-CM | POA: Diagnosis not present

## 2016-04-15 DIAGNOSIS — I2582 Chronic total occlusion of coronary artery: Secondary | ICD-10-CM | POA: Diagnosis not present

## 2016-04-15 DIAGNOSIS — I10 Essential (primary) hypertension: Secondary | ICD-10-CM | POA: Insufficient documentation

## 2016-04-15 DIAGNOSIS — M797 Fibromyalgia: Secondary | ICD-10-CM | POA: Diagnosis not present

## 2016-04-15 DIAGNOSIS — Z823 Family history of stroke: Secondary | ICD-10-CM | POA: Diagnosis not present

## 2016-04-15 DIAGNOSIS — E1151 Type 2 diabetes mellitus with diabetic peripheral angiopathy without gangrene: Secondary | ICD-10-CM | POA: Diagnosis not present

## 2016-04-15 DIAGNOSIS — K219 Gastro-esophageal reflux disease without esophagitis: Secondary | ICD-10-CM | POA: Insufficient documentation

## 2016-04-15 DIAGNOSIS — Z794 Long term (current) use of insulin: Secondary | ICD-10-CM | POA: Diagnosis not present

## 2016-04-15 DIAGNOSIS — I25709 Atherosclerosis of coronary artery bypass graft(s), unspecified, with unspecified angina pectoris: Secondary | ICD-10-CM

## 2016-04-15 DIAGNOSIS — Z7951 Long term (current) use of inhaled steroids: Secondary | ICD-10-CM | POA: Insufficient documentation

## 2016-04-15 DIAGNOSIS — I25719 Atherosclerosis of autologous vein coronary artery bypass graft(s) with unspecified angina pectoris: Secondary | ICD-10-CM | POA: Insufficient documentation

## 2016-04-15 DIAGNOSIS — Z955 Presence of coronary angioplasty implant and graft: Secondary | ICD-10-CM

## 2016-04-15 DIAGNOSIS — N3281 Overactive bladder: Secondary | ICD-10-CM | POA: Insufficient documentation

## 2016-04-15 DIAGNOSIS — I25810 Atherosclerosis of coronary artery bypass graft(s) without angina pectoris: Secondary | ICD-10-CM | POA: Diagnosis present

## 2016-04-15 DIAGNOSIS — Z7982 Long term (current) use of aspirin: Secondary | ICD-10-CM | POA: Insufficient documentation

## 2016-04-15 HISTORY — DX: Peripheral vascular disease, unspecified: I73.9

## 2016-04-15 HISTORY — PX: CORONARY STENT INTERVENTION: CATH118234

## 2016-04-15 HISTORY — PX: LEFT HEART CATH AND CORS/GRAFTS ANGIOGRAPHY: CATH118250

## 2016-04-15 LAB — BASIC METABOLIC PANEL
BUN / CREAT RATIO: 35 — AB (ref 12–28)
BUN: 19 mg/dL (ref 8–27)
CALCIUM: 8.8 mg/dL (ref 8.7–10.3)
CHLORIDE: 98 mmol/L (ref 96–106)
CO2: 26 mmol/L (ref 18–29)
CREATININE: 0.55 mg/dL — AB (ref 0.57–1.00)
GFR calc Af Amer: 115 mL/min/{1.73_m2} (ref >59)
GFR calc non Af Amer: 100 mL/min/{1.73_m2} (ref >59)
GLUCOSE: 178 mg/dL — AB (ref 65–99)
Potassium: 4 mmol/L (ref 3.5–5.2)
Sodium: 140 mmol/L (ref 134–144)

## 2016-04-15 LAB — CBC
HEMOGLOBIN: 12.6 g/dL (ref 11.1–15.9)
Hematocrit: 37 % (ref 34.0–46.6)
MCH: 29.9 pg (ref 26.6–33.0)
MCHC: 34.1 g/dL (ref 31.5–35.7)
MCV: 88 fL (ref 79–97)
PLATELETS: 326 10*3/uL (ref 150–379)
RBC: 4.21 x10E6/uL (ref 3.77–5.28)
RDW: 13.1 % (ref 12.3–15.4)
WBC: 12.1 10*3/uL — AB (ref 3.4–10.8)

## 2016-04-15 LAB — GLUCOSE, CAPILLARY
GLUCOSE-CAPILLARY: 104 mg/dL — AB (ref 65–99)
Glucose-Capillary: 162 mg/dL — ABNORMAL HIGH (ref 65–99)
Glucose-Capillary: 176 mg/dL — ABNORMAL HIGH (ref 65–99)

## 2016-04-15 LAB — PROTIME-INR
INR: 0.9 (ref 0.8–1.2)
PROTHROMBIN TIME: 10.1 s (ref 9.1–12.0)

## 2016-04-15 LAB — POCT ACTIVATED CLOTTING TIME: Activated Clotting Time: 703 seconds

## 2016-04-15 SURGERY — LEFT HEART CATH AND CORS/GRAFTS ANGIOGRAPHY
Anesthesia: LOCAL

## 2016-04-15 MED ORDER — LIDOCAINE HCL (PF) 1 % IJ SOLN
INTRAMUSCULAR | Status: DC | PRN
  Administered 2016-04-15: 15 mL

## 2016-04-15 MED ORDER — NITROGLYCERIN IN D5W 200-5 MCG/ML-% IV SOLN
INTRAVENOUS | Status: AC
  Filled 2016-04-15: qty 250

## 2016-04-15 MED ORDER — SODIUM CHLORIDE 0.9 % IV SOLN: 250.0000 mL | INTRAVENOUS | Status: DC | PRN

## 2016-04-15 MED ORDER — SODIUM CHLORIDE 0.9 % IV SOLN: 1.7500 mg/kg/h | INTRAVENOUS | Status: AC

## 2016-04-15 MED ORDER — GABAPENTIN 300 MG PO CAPS
600.0000 mg | ORAL_CAPSULE | Freq: Three times a day (TID) | ORAL | Status: DC
  Administered 2016-04-15 – 2016-04-16 (×2): 600 mg via ORAL
  Filled 2016-04-15 (×2): qty 2

## 2016-04-15 MED ORDER — IOPAMIDOL (ISOVUE-370) INJECTION 76%
INTRAVENOUS | Status: AC
  Filled 2016-04-15: qty 100

## 2016-04-15 MED ORDER — LABETALOL HCL 5 MG/ML IV SOLN: 10.0000 mg | INTRAVENOUS | Status: AC | PRN

## 2016-04-15 MED ORDER — BIVALIRUDIN BOLUS VIA INFUSION - CUPID
INTRAVENOUS | Status: DC | PRN
  Administered 2016-04-15: 48.975 mg via INTRAVENOUS

## 2016-04-15 MED ORDER — LIDOCAINE HCL (PF) 1 % IJ SOLN
INTRAMUSCULAR | Status: AC
  Filled 2016-04-15: qty 30

## 2016-04-15 MED ORDER — SODIUM CHLORIDE 0.9 % IV SOLN: INTRAVENOUS | Status: DC

## 2016-04-15 MED ORDER — ASPIRIN 81 MG PO CHEW
CHEWABLE_TABLET | ORAL | Status: AC
  Administered 2016-04-15: 81 mg via ORAL
  Filled 2016-04-15: qty 1

## 2016-04-15 MED ORDER — FENTANYL CITRATE (PF) 100 MCG/2ML IJ SOLN
INTRAMUSCULAR | Status: AC
  Filled 2016-04-15: qty 2

## 2016-04-15 MED ORDER — SODIUM CHLORIDE 0.9% FLUSH: 3.0000 mL | INTRAVENOUS | Status: DC | PRN

## 2016-04-15 MED ORDER — SODIUM CHLORIDE 0.9 % WEIGHT BASED INFUSION
3.0000 mL/kg/h | INTRAVENOUS | Status: DC
  Administered 2016-04-15: 3 mL/kg/h via INTRAVENOUS

## 2016-04-15 MED ORDER — MIDAZOLAM HCL 2 MG/2ML IJ SOLN
INTRAMUSCULAR | Status: AC
  Filled 2016-04-15: qty 2

## 2016-04-15 MED ORDER — NITROGLYCERIN IN D5W 200-5 MCG/ML-% IV SOLN: 0.0000 ug/min | INTRAVENOUS | Status: DC

## 2016-04-15 MED ORDER — SODIUM CHLORIDE 0.9 % IV SOLN
INTRAVENOUS | Status: DC | PRN
  Administered 2016-04-15: 1.75 mg/kg/h via INTRAVENOUS

## 2016-04-15 MED ORDER — FENTANYL CITRATE (PF) 100 MCG/2ML IJ SOLN
INTRAMUSCULAR | Status: DC | PRN
  Administered 2016-04-15: 25 ug via INTRAVENOUS

## 2016-04-15 MED ORDER — SODIUM CHLORIDE 0.9% FLUSH
3.0000 mL | Freq: Two times a day (BID) | INTRAVENOUS | Status: DC
  Administered 2016-04-15: 3 mL via INTRAVENOUS

## 2016-04-15 MED ORDER — LORAZEPAM 0.5 MG PO TABS
0.5000 mg | ORAL_TABLET | Freq: Once | ORAL | Status: AC
  Administered 2016-04-15: 0.5 mg via ORAL
  Filled 2016-04-15: qty 1

## 2016-04-15 MED ORDER — CLOPIDOGREL BISULFATE 75 MG PO TABS
75.0000 mg | ORAL_TABLET | Freq: Every day | ORAL | Status: DC
  Administered 2016-04-16: 75 mg via ORAL
  Filled 2016-04-15: qty 1

## 2016-04-15 MED ORDER — NITROGLYCERIN 1 MG/10 ML FOR IR/CATH LAB
INTRA_ARTERIAL | Status: DC | PRN
  Administered 2016-04-15: 200 ug via INTRACORONARY

## 2016-04-15 MED ORDER — HEPARIN (PORCINE) IN NACL 2-0.9 UNIT/ML-% IJ SOLN
INTRAMUSCULAR | Status: AC
  Filled 2016-04-15: qty 1000

## 2016-04-15 MED ORDER — ASPIRIN 81 MG PO CHEW
81.0000 mg | CHEWABLE_TABLET | ORAL | Status: AC
  Administered 2016-04-15: 81 mg via ORAL

## 2016-04-15 MED ORDER — SODIUM CHLORIDE 0.9% FLUSH
3.0000 mL | Freq: Two times a day (BID) | INTRAVENOUS | Status: DC
Start: 2016-04-15 — End: 2016-04-15

## 2016-04-15 MED ORDER — CLOPIDOGREL BISULFATE 300 MG PO TABS
ORAL_TABLET | ORAL | Status: AC
  Filled 2016-04-15: qty 1

## 2016-04-15 MED ORDER — HEPARIN (PORCINE) IN NACL 2-0.9 UNIT/ML-% IJ SOLN
INTRAMUSCULAR | Status: DC | PRN
  Administered 2016-04-15: 1000 mL

## 2016-04-15 MED ORDER — SODIUM CHLORIDE 0.9 % WEIGHT BASED INFUSION: 1.0000 mL/kg/h | INTRAVENOUS | Status: DC

## 2016-04-15 MED ORDER — MIDAZOLAM HCL 2 MG/2ML IJ SOLN
INTRAMUSCULAR | Status: DC | PRN
  Administered 2016-04-15: 2 mg via INTRAVENOUS

## 2016-04-15 MED ORDER — NITROGLYCERIN 1 MG/10 ML FOR IR/CATH LAB
INTRA_ARTERIAL | Status: AC
  Filled 2016-04-15: qty 10

## 2016-04-15 MED ORDER — ATORVASTATIN CALCIUM 80 MG PO TABS
80.0000 mg | ORAL_TABLET | Freq: Every day | ORAL | Status: DC
  Administered 2016-04-15: 80 mg via ORAL
  Filled 2016-04-15: qty 1

## 2016-04-15 MED ORDER — METOPROLOL TARTRATE 12.5 MG HALF TABLET
12.5000 mg | ORAL_TABLET | Freq: Two times a day (BID) | ORAL | Status: DC
  Administered 2016-04-15 – 2016-04-16 (×2): 12.5 mg via ORAL
  Filled 2016-04-15 (×2): qty 1

## 2016-04-15 MED ORDER — HYDRALAZINE HCL 20 MG/ML IJ SOLN: 5.0000 mg | INTRAMUSCULAR | Status: AC | PRN

## 2016-04-15 MED ORDER — ONDANSETRON HCL 4 MG/2ML IJ SOLN: 4.0000 mg | Freq: Four times a day (QID) | INTRAMUSCULAR | Status: DC | PRN

## 2016-04-15 MED ORDER — NITROGLYCERIN IN D5W 200-5 MCG/ML-% IV SOLN
INTRAVENOUS | Status: DC | PRN
  Administered 2016-04-15: 10 ug/min via INTRAVENOUS

## 2016-04-15 MED ORDER — CLOPIDOGREL BISULFATE 300 MG PO TABS
ORAL_TABLET | ORAL | Status: DC | PRN
  Administered 2016-04-15: 600 mg via ORAL

## 2016-04-15 MED ORDER — IOPAMIDOL (ISOVUE-370) INJECTION 76%
INTRAVENOUS | Status: AC
  Filled 2016-04-15: qty 125

## 2016-04-15 MED ORDER — ISOSORBIDE MONONITRATE ER 30 MG PO TB24
30.0000 mg | ORAL_TABLET | Freq: Every day | ORAL | Status: DC
  Administered 2016-04-16: 30 mg via ORAL
  Filled 2016-04-15: qty 1

## 2016-04-15 MED ORDER — ACETAMINOPHEN 325 MG PO TABS: 650.0000 mg | ORAL_TABLET | ORAL | Status: DC | PRN

## 2016-04-15 MED ORDER — IOPAMIDOL (ISOVUE-370) INJECTION 76%
INTRAVENOUS | Status: DC | PRN
  Administered 2016-04-15: 190 mL via INTRA_ARTERIAL

## 2016-04-15 MED ORDER — ASPIRIN 81 MG PO CHEW
81.0000 mg | CHEWABLE_TABLET | Freq: Every day | ORAL | Status: DC
  Administered 2016-04-16: 81 mg via ORAL
  Filled 2016-04-15: qty 1

## 2016-04-15 MED ORDER — BIVALIRUDIN 250 MG IV SOLR
INTRAVENOUS | Status: AC
  Filled 2016-04-15: qty 250

## 2016-04-15 SURGICAL SUPPLY — 17 items
BALLN EUPHORA RX 2.5X12 (BALLOONS) ×2
BALLN ~~LOC~~ EUPHORA RX 3.75X8 (BALLOONS) ×2
BALLOON EUPHORA RX 2.5X12 (BALLOONS) IMPLANT
BALLOON ~~LOC~~ EUPHORA RX 3.75X8 (BALLOONS) IMPLANT
CATH INFINITI 5 FR RCB (CATHETERS) ×1 IMPLANT
CATH INFINITI 5FR MULTPACK ANG (CATHETERS) ×1 IMPLANT
CATH VISTA GUIDE RCB (CATHETERS) ×1 IMPLANT
KIT HEART LEFT (KITS) ×2 IMPLANT
PACK CARDIAC CATHETERIZATION (CUSTOM PROCEDURE TRAY) ×2 IMPLANT
SHEATH PINNACLE 5F 10CM (SHEATH) ×1 IMPLANT
SHEATH PINNACLE 6F 10CM (SHEATH) ×1 IMPLANT
STENT SYNERGY DES 3.5X12 (Permanent Stent) ×1 IMPLANT
SYR MEDRAD MARK V 150ML (SYRINGE) ×2 IMPLANT
TRANSDUCER W/STOPCOCK (MISCELLANEOUS) ×2 IMPLANT
TUBING CIL FLEX 10 FLL-RA (TUBING) ×2 IMPLANT
WIRE ASAHI PROWATER 180CM (WIRE) ×1 IMPLANT
WIRE EMERALD 3MM-J .035X150CM (WIRE) ×1 IMPLANT

## 2016-04-15 NOTE — Progress Notes (Signed)
Pharmacy Consult for bivalirudin 66 yo female admitted from clinic with abnormal stress test Has a hx of CAD and prior CABG Went for cardiac cath today and had PCI Will continue bivalirudin at 1.75 mg/kg/hr for 2 hours or until bag complete CBC stable and wnl  Baldemar Friday  04/15/2016 3:47 PM

## 2016-04-15 NOTE — Progress Notes (Signed)
Site area: right groin  Site Prior to Removal:  Level 0  Pressure Applied For 20 MINUTES    Minutes Beginning at 1834  Manual:   Yes.    Patient Status During Pull:  AAO X 4  Post Pull Groin Site:  Level 0  Post Pull Instructions Given:  Yes.    Post Pull Pulses Present:  Yes.    Dressing Applied:  Yes.    Comments:  TOLERATED PROCEDURE WELL

## 2016-04-15 NOTE — Interval H&P Note (Signed)
Cath Lab Visit (complete for each Cath Lab visit)  Clinical Evaluation Leading to the Procedure:   ACS: No.  Non-ACS:    Anginal Classification: CCS II  Anti-ischemic medical therapy: Minimal Therapy (1 class of medications)  Non-Invasive Test Results: Intermediate-risk stress test findings: cardiac mortality 1-3%/year  Prior CABG: Previous CABG      History and Physical Interval Note:  04/15/2016 1:56 PM  Hilbert Odor  has presented today for surgery, with the diagnosis of abnormal stress  The various methods of treatment have been discussed with the patient and family. After consideration of risks, benefits and other options for treatment, the patient has consented to  Procedure(s): Left Heart Cath and Cors/Grafts Angiography (N/A) as a surgical intervention .  The patient's history has been reviewed, patient examined, no change in status, stable for surgery.  I have reviewed the patient's chart and labs.  Questions were answered to the patient's satisfaction.     Nicki Guadalajara

## 2016-04-15 NOTE — H&P (View-Only) (Signed)
04/14/2016 Andrea [name]   12/13/1950  [id number]  Primary Physician Arnoldo Morale, MD Primary Cardiologist: Dr. Cathie Olden   Reason for Visit/CC: F/u for Abnormal NST   HPI:  Andrea [name] presents back to clinic today, as advised by Dr. Acie Fredrickson, to discuss indication for [facility], given recent abnormal NST. She is a 66 y/o female with a h/o CAD - hx of  stenting 2000 and CABG 2006 (Dr. Cyndia Bent), PVD, HTN and DM. She was recently seen by Dr. Acie Fredrickson on 04/07/16 for pre-operative assessment/clearance prior to anticipated below the knee popliteal bypass for PVD, which is tentatively scheduled for 04/20/16 with Dr. Donzetta Matters. As part of her evaluation, Dr. Cathie Olden order a NST. This demonstrated a medium size, moderate intensity reversible (SDS 5) mid-anterior, anteroseptal and anterolateral perfusion defect suggestive of ischemia. LVEF 52% with normal wall motion. She will need definitive LHC prior to clearance.   She is asymptomatic in clinic today. She denies chest pain and dyspnea. VSS. All questions and concerns were addressed.    Study Highlights     Nuclear stress EF: 52%.  No T wave inversion was noted during stress.  Downsloping ST segment depression ST segment depression of 2 mm was noted during stress in the II, III, aVF, V6, V5 and V4 leads.  Defect 1: There is a medium defect of moderate severity.  Findings consistent with ischemia.  This is an intermediate risk study.   Medium size, moderate intensity reversible (SDS 5) mid-anterior, anteroseptal and anterolateral perfusion defect suggestive of ischemia. LVEF 52% with normal wall motion. This is an intermediate risk study.     Current Meds  Medication Sig  . albuterol (PROVENTIL HFA;VENTOLIN HFA) 108 (90 Base) MCG/ACT inhaler Inhale 2 puffs into the lungs every 6 (six) hours as needed for wheezing or shortness of breath.  Marland Kitchen aspirin EC 81 MG tablet Take 1 tablet (81 mg total) by mouth daily.  Marland Kitchen atorvastatin (LIPITOR) 80 MG tablet Take  1 tablet (80 mg total) by mouth daily.  . Blood Glucose Monitoring Suppl (TRUE METRIX GO GLUCOSE METER) w/Device KIT 1 each by Does not apply route every 8 (eight) hours as needed.  . carbamide peroxide (DEBROX) 6.5 % otic solution Place 5 drops into both ears as needed (WAX BUILDUP).  . Fluticasone-Salmeterol (ADVAIR) 100-50 MCG/DOSE AEPB Inhale 1 puff into the lungs 2 (two) times daily.  Marland Kitchen gabapentin (NEURONTIN) 300 MG capsule Take 600 mg by mouth 3 (three) times daily.   Marland Kitchen glucose blood (TRUE METRIX BLOOD GLUCOSE TEST) test strip Use as instructed  . hydrochlorothiazide (HYDRODIURIL) 25 MG tablet Take 1 tablet (25 mg total) by mouth daily.  . Insulin Glargine (LANTUS SOLOSTAR) 100 UNIT/ML Solostar Pen Inject 30 Units into the skin daily at 10 pm.  . Insulin Pen Needle (ULTICARE MICRO PEN NEEDLES) 32G X 4 MM MISC 1 applicator by Does not apply route at bedtime.  Marland Kitchen lisinopril (PRINIVIL,ZESTRIL) 10 MG tablet Take 1 tablet (10 mg total) by mouth daily.  . metFORMIN (GLUCOPHAGE) 1000 MG tablet Take 1 tablet (1,000 mg total) by mouth 2 (two) times daily.  Marland Kitchen omeprazole (PRILOSEC) 20 MG capsule TAKE 1 CAPSULE BY MOUTH DAILY.  Marland Kitchen oxybutynin (DITROPAN) 5 MG tablet TAKE 1 TABLET BY MOUTH 2 TIMES DAILY.  Marland Kitchen pregabalin (LYRICA) 100 MG capsule Take 1 capsule (100 mg total) by mouth 2 (two) times daily.  . TRUEPLUS LANCETS 26G MISC 1 each by Does not apply route every 8 (eight) hours as needed.  . [DISCONTINUED]  amoxicillin (AMOXIL) 500 MG capsule Take 1 capsule (500 mg total) by mouth 3 (three) times daily.  . [DISCONTINUED] carbamide peroxide (DEBROX) 6.5 % otic solution Place 5 drops into the right ear 2 (two) times daily. (Patient taking differently: Place 5 drops into both ears daily as needed (ear wax). )  . [DISCONTINUED] nicotine (NICODERM CQ) 21 mg/24hr patch Place 1 patch (21 mg total) onto the skin daily.   Allergies  Allergen Reactions  . Tramadol Hcl Itching  . Naproxen Itching   Past Medical  History:  Diagnosis Date  . Coronary artery disease   . Diabetes mellitus without complication ([place])   . Fibromyalgia    Family History  Problem Relation Age of Onset  . Stroke Mother    Past Surgical History:  Procedure Laterality Date  . ABDOMINAL AORTOGRAM W/LOWER EXTREMITY N/A 03/25/2016   Procedure: Abdominal Aortogram w/Lower Extremity;  Surgeon: Waynetta Sandy, MD;  Location: [place] [facility];  Service: Cardiovascular;  Laterality: N/A;  . CORONARY ARTERY BYPASS GRAFT    . KNEE SURGERY     Social History   Social History  . Marital status: Widowed    Spouse name: N/A  . Number of children: N/A  . Years of education: N/A   Occupational History  . Not on file.   Social History Main Topics  . Smoking status: Former Smoker    Packs/day: 0.25    Types: Cigarettes  . Smokeless tobacco: Never Used     Comment: last cig one week ago  . Alcohol use No  . Drug use: No  . Sexual activity: Not on file   Other Topics Concern  . Not on file   Social History Narrative  . No narrative on file     Review of Systems: General: negative for chills, fever, night sweats or weight changes.  Cardiovascular: negative for chest pain, dyspnea on exertion, edema, orthopnea, palpitations, paroxysmal nocturnal dyspnea or shortness of breath Dermatological: negative for rash Respiratory: negative for cough or wheezing Urologic: negative for hematuria Abdominal: negative for nausea, vomiting, diarrhea, bright red blood per rectum, melena, or hematemesis Neurologic: negative for visual changes, syncope, or dizziness All other systems reviewed and are otherwise negative except as noted above.   Physical Exam:  Blood pressure 118/64, pulse 80, height _0  (1.575 m), weight 144 lb (65.3 kg), SpO2 96 %.  General appearance: alert, cooperative and no distress Neck: no carotid bruit and no JVD Lungs: clear to auscultation bilaterally Heart: regular rate and rhythm, S1, S2  normal, no murmur, click, rub or gallop Extremities: extremities normal, atraumatic, no cyanosis or edema Pulses: 2+ and symmetric Skin: Skin color, texture, turgor normal. No rashes or lesions Neurologic: Grossly normal  EKG not performed.   ASSESSMENT AND PLAN:   1. CAD/ Abnormal NST: hx of  stenting 2000 and CABG 2006 (Dr. Cyndia Bent). Recent NST demonstrated a medium size, moderate intensity reversible (SDS 5) mid-anterior, anteroseptal and anterolateral perfusion defect suggestive of ischemia. LVEF 52% with normal wall motion. She will need definitive LHC prior to surgical clearance. She denies any active CP or dyspnea.   2. Pre-operative Risk Assessment/Cardiac Clearance: Clearance is on hold until complete w/u is conducted. Further recs pending, once LHC is completed.   3. PVD: tentatively scheduled for below the knee popliteal bypass for PVD. Awaiting pre-operative w/u and clearance.   4. HTN: controlled on current regimen.   5. DM: followed by PCP.    PLAN  We discussed indication  for LHC, as well as procedural details and risk which include but are not limited to death, MI, need for emergency CABG, CVA, bleeding, vascular injury, loss of limb, nephrotoxicity and allergic reaction. The patient understands these risk and agrees to proceed with cardiac catheterization. We will arrange for study to be completed later this week a M[facility]. We will obtain pre-cath labs, CBC, BMP and INR. Hold metformin. lisinopril and HCTZ morning of procedure.    Lyda Jester PA-C 04/14/2016 2:29 PM

## 2016-04-16 ENCOUNTER — Encounter: Payer: Self-pay | Admitting: Vascular Surgery

## 2016-04-16 ENCOUNTER — Encounter (HOSPITAL_COMMUNITY): Payer: Self-pay | Admitting: Cardiovascular Disease

## 2016-04-16 ENCOUNTER — Encounter (HOSPITAL_COMMUNITY): Payer: Self-pay

## 2016-04-16 ENCOUNTER — Inpatient Hospital Stay (HOSPITAL_COMMUNITY): Admission: RE | Admit: 2016-04-16 | Payer: Self-pay | Source: Ambulatory Visit

## 2016-04-16 ENCOUNTER — Telehealth: Payer: Self-pay | Admitting: Nurse Practitioner

## 2016-04-16 DIAGNOSIS — I2582 Chronic total occlusion of coronary artery: Secondary | ICD-10-CM | POA: Diagnosis not present

## 2016-04-16 DIAGNOSIS — I25700 Atherosclerosis of coronary artery bypass graft(s), unspecified, with unstable angina pectoris: Secondary | ICD-10-CM

## 2016-04-16 DIAGNOSIS — I10 Essential (primary) hypertension: Secondary | ICD-10-CM | POA: Diagnosis not present

## 2016-04-16 DIAGNOSIS — I25719 Atherosclerosis of autologous vein coronary artery bypass graft(s) with unspecified angina pectoris: Secondary | ICD-10-CM | POA: Diagnosis not present

## 2016-04-16 DIAGNOSIS — E1151 Type 2 diabetes mellitus with diabetic peripheral angiopathy without gangrene: Secondary | ICD-10-CM | POA: Diagnosis not present

## 2016-04-16 LAB — GLUCOSE, CAPILLARY
Glucose-Capillary: 190 mg/dL — ABNORMAL HIGH (ref 65–99)
Glucose-Capillary: 220 mg/dL — ABNORMAL HIGH (ref 65–99)

## 2016-04-16 LAB — BASIC METABOLIC PANEL
Anion gap: 10 (ref 5–15)
BUN: 15 mg/dL (ref 6–20)
CO2: 26 mmol/L (ref 22–32)
CREATININE: 0.56 mg/dL (ref 0.44–1.00)
Calcium: 8.6 mg/dL — ABNORMAL LOW (ref 8.9–10.3)
Chloride: 102 mmol/L (ref 101–111)
GFR calc Af Amer: >60 mL/min (ref >60)
GLUCOSE: 210 mg/dL — AB (ref 65–99)
Potassium: 3.8 mmol/L (ref 3.5–5.1)
Sodium: 138 mmol/L (ref 135–145)

## 2016-04-16 LAB — CBC
HEMATOCRIT: 33.7 % — AB (ref 36.0–46.0)
Hemoglobin: 10.8 g/dL — ABNORMAL LOW (ref 12.0–15.0)
MCH: 29.7 pg (ref 26.0–34.0)
MCHC: 32 g/dL (ref 30.0–36.0)
MCV: 92.6 fL (ref 78.0–100.0)
PLATELETS: 256 10*3/uL (ref 150–400)
RBC: 3.64 MIL/uL — ABNORMAL LOW (ref 3.87–5.11)
RDW: 12.6 % (ref 11.5–15.5)
WBC: 9.6 10*3/uL (ref 4.0–10.5)

## 2016-04-16 MED ORDER — HYDROCHLOROTHIAZIDE 12.5 MG PO CAPS
12.5000 mg | ORAL_CAPSULE | Freq: Every day | ORAL | Status: DC
  Administered 2016-04-16: 12.5 mg via ORAL
  Filled 2016-04-16: qty 1

## 2016-04-16 MED ORDER — CLOPIDOGREL BISULFATE 75 MG PO TABS: 75.0000 mg | ORAL_TABLET | Freq: Every day | ORAL | 3 refills | Status: DC

## 2016-04-16 MED ORDER — METOPROLOL TARTRATE 25 MG PO TABS: 12.5000 mg | ORAL_TABLET | Freq: Two times a day (BID) | ORAL | 3 refills | Status: DC

## 2016-04-16 MED ORDER — METFORMIN HCL 1000 MG PO TABS: 1000.0000 mg | ORAL_TABLET | Freq: Two times a day (BID) | ORAL | 3 refills | Status: DC

## 2016-04-16 MED ORDER — LISINOPRIL 10 MG PO TABS
10.0000 mg | ORAL_TABLET | Freq: Every day | ORAL | Status: DC
  Administered 2016-04-16: 10 mg via ORAL
  Filled 2016-04-16: qty 1

## 2016-04-16 MED ORDER — SODIUM CHLORIDE 0.9 % IV SOLN
Freq: Once | INTRAVENOUS | Status: AC
  Administered 2016-04-16: via INTRAVENOUS

## 2016-04-16 MED FILL — METOPROLOL TARTRATE 25 MG T: 25 | 90 days supply | Qty: 90 | Fill #0

## 2016-04-16 MED FILL — CLOPIDOGREL 75 MG TABLET: 75 | 90 days supply | Qty: 90 | Fill #0

## 2016-04-16 NOTE — Evaluation (Signed)
Physical Therapy Evaluation Patient Details Name: Andrea [name] MRN: [id number] DOB: [birth year] Today's Date: 04/16/2016   History of Present Illness  Patient is a 66 y/o female who presents from clinic with abnormal stress test, now s/p cardiac cath and stent placement. PMH includes CAD, PVD, HTN, fibromyalgia and DM.   Clinical Impression  Patient presents with generalized weakness and mild balance deficits s/p above. Tolerated gait training with SPC vs RW. Stability and balance improved with use of RW however pt has a crowded house and RW will not fit in apt. Furniture walker PTA and reports no falls. Agreeable to using RW for community ambulation. Pt has support from children intermittently. Education re: fall reduction, assist for in/out of shower/stairs etc. Pt does not require skilled therapy services as pt functioning close to baseline. Discharge from therapy.    Follow Up Recommendations No PT follow up;Supervision - Intermittent    Equipment Recommendations  None recommended by PT    Recommendations for Other Services       Precautions / Restrictions Precautions Precautions: None      Mobility  Bed Mobility               General bed mobility comments: Sitting in chair upon PT arrival.   Transfers Overall transfer level: Needs assistance Equipment used: Straight cane Transfers: Sit to/from Stand Sit to Stand: Modified independent (Device/Increase time)         General transfer comment: Stood without assist using SPC. SPT chair to/from [facility] furniture walking but no physical assist.   Ambulation/Gait Ambulation/Gait assistance: Supervision;Min guard Ambulation Distance (Feet): 150 Feet Assistive device: Rolling walker (2 wheeled);Straight cane Gait Pattern/deviations: Step-through pattern;Decreased stride length;Trunk flexed Gait velocity: decreased .71 ft/sec Gait velocity interpretation: Below normal speed for age/gender General Gait Details: Slow,  unsteady gait with SPC requiring need for other UE support progressing to RW and supervision initially. More stable. HR 67-75 bpm.   Stairs            Wheelchair Mobility    Modified Rankin (Stroke Patients Only)       Balance Overall balance assessment: Needs assistance Sitting-balance support: Feet supported;No upper extremity supported Sitting balance-Leahy Scale: Good     Standing balance support: During functional activity Standing balance-Leahy Scale: Fair Standing balance comment: Requires UE support in standing for safety/stability.                             Pertinent Vitals/Pain Pain Assessment: No/denies pain    Home Living Family/patient expects to be discharged to:: Private residence Living Arrangements: Alone Available Help at Discharge: Family;Available PRN/intermittently Type of Home: Apartment Home Access: Level entry     Home Layout: Two level;Able to live on main level with bedroom/bathroom Home Equipment: Gilmer Mor - single point;Walker - 2 wheels      Prior Function Level of Independence: Independent with assistive device(s)         Comments: Uses SPC vs furniture walker for household distances and RW for community. Drives. Has bedroom and shower upstairs and sometimes sleeps downstairs on couch/recliner depending on how she feels. Children assist as needed. Pt reports a crowded apt.     Hand Dominance        Extremity/Trunk Assessment   Upper Extremity Assessment Upper Extremity Assessment: Defer to OT evaluation    Lower Extremity Assessment Lower Extremity Assessment: Generalized weakness (but functional)    Cervical / Trunk Assessment Cervical / Trunk Assessment:  Kyphotic  Communication   Communication: HOH  Cognition Arousal/Alertness: Awake/alert Behavior During Therapy: WFL for tasks assessed/performed Overall Cognitive Status: Within Functional Limits for tasks assessed                                         General Comments      Exercises     Assessment/Plan    PT Assessment Patent does not need any further PT services  PT Problem List         PT Treatment Interventions      PT Goals (Current goals can be found in the Care Plan section)  Acute Rehab PT Goals Patient Stated Goal: to go home PT Goal Formulation: All assessment and education complete, DC therapy    Frequency     Barriers to discharge        Co-evaluation               End of Session Equipment Utilized During Treatment: Gait belt Activity Tolerance: Patient tolerated treatment well Patient left: in chair;with call bell/phone within reach;with chair alarm set Nurse Communication: Mobility status PT Visit Diagnosis: Unsteadiness on feet (R26.81)    Time: 9562-1308 PT Time Calculation (min) (ACUTE ONLY): 27 min   Charges:   PT Evaluation $PT Eval Low Complexity: 1 Procedure PT Treatments $Gait Training: 8-22 mins   PT G Codes:   PT G-Codes **NOT FOR INPATIENT CLASS** Functional Assessment Tool Used: Clinical judgement Functional Limitation: Mobility: Walking and moving around Mobility: Walking and Moving Around Current Status ([id number]): At least 1 percent but less than 20 percent impaired, limited or restricted Mobility: Walking and Moving Around Goal Status [phone]): At least 1 percent but less than 20 percent impaired, limited or restricted Mobility: Walking and Moving Around Discharge Status [phone]): At least 1 percent but less than 20 percent impaired, limited or restricted    [place], [place], [place] [phone]    Marcy Panning 04/16/2016, 12:18 PM

## 2016-04-16 NOTE — Progress Notes (Addendum)
Progress Note  Patient Name: Andrea [name] Date of Encounter: 04/16/2016  Primary Cardiologist: Dr. Elease Hashimoto  Subjective   Patient is feeling well; denies chest pain, SOB, and palpitations. States her feet hurt.   Inpatient Medications    Scheduled Meds: . aspirin  81 mg Oral Daily  . atorvastatin  80 mg Oral q1800  . clopidogrel  75 mg Oral Q breakfast  . gabapentin  600 mg Oral TID  . isosorbide mononitrate  30 mg Oral Daily  . metoprolol tartrate  12.5 mg Oral BID  . sodium chloride flush  3 mL Intravenous Q12H   Continuous Infusions: . sodium chloride 125 mL/hr at 04/15/16 1620  . nitroGLYCERIN 40 mcg/min (04/16/16 0714)   PRN Meds: sodium chloride, acetaminophen, ondansetron (ZOFRAN) IV, sodium chloride flush   Vital Signs    Vitals:   04/16/16 0337 04/16/16 0400 04/16/16 0715 04/16/16 0834  BP:  (!) 151/62 (!) 173/61 (!) 146/54  Pulse: 73   85  Resp:      Temp: 98.4 F (36.9 C)     TempSrc: Oral     SpO2: 93% 95% 92%   Weight: 142 lb 12.8 oz (64.8 kg)     Height:        Intake/Output Summary (Last 24 hours) at 04/16/16 0841 Last data filed at 04/16/16 0600  Gross per 24 hour  Intake            51.08 ml  Output             2050 ml  Net         -1998.92 ml   Filed Weights   04/15/16 0931 04/16/16 0337  Weight: 144 lb (65.3 kg) 142 lb 12.8 oz (64.8 kg)     Physical Exam   General: Well developed, well nourished, female appearing in no acute distress. Head: Normocephalic, atraumatic.  Neck: Supple, bruits appreciated bilaterally, no JVD. Lungs:  Resp regular and unlabored, CTA. Heart: RRR, S1, S2, no murmur; no rub. Abdomen: Soft, non-tender, non-distended with normoactive bowel sounds. No hepatomegaly. No rebound/guarding. No obvious abdominal masses. Extremities: No clubbing, cyanosis, Trace to 1+ edema. Distal pedal pulses are very faint bilaterally.  Neuro: Alert and oriented X 3. Moves all extremities spontaneously. Psych: Normal  affect.  Labs    Chemistry Recent Labs Lab 04/14/16 1431 04/16/16 0400  NA 140 138  K 4.0 3.8  CL 98 102  CO2 26 26  GLUCOSE 178* 210*  BUN 19 15  CREATININE 0.55* 0.56  CALCIUM 8.8 8.6*  GFRNONAA 100 >60  GFRAA 115 >60  ANIONGAP  --  10     Hematology Recent Labs Lab 04/14/16 1431 04/16/16 0400  WBC 12.1* 9.6  RBC 4.21 3.64*  HGB  --  10.8*  HCT 37.0 33.7*  MCV 88 92.6  MCH 29.9 29.7  MCHC 34.1 32.0  RDW 13.1 12.6  PLT 326 256    Cardiac EnzymesNo results for input(s): TROPONINI in the last 168 hours. No results for input(s): TROPIPOC in the last 168 hours.   BNPNo results for input(s): BNP, PROBNP in the last 168 hours.   DDimer No results for input(s): DDIMER in the last 168 hours.   Radiology    No results found.   Telemetry    NSR - Personally Reviewed  ECG    EKG 04/16/16: NSR with ST changes in I, II, AVF, V3-5 - Personally Reviewed   Cardiac Studies   Heart catheterization 04/15/16: Severe native CAD  with 95% very proximal stenosis of the LAD before small first diagonal branch with total occlusion of the LAD after this diagonal; patent stent at the ostium of the circumflex with in-stent narrowing of 30% with total occlusion of the OM vessel after a small AV groove circumflex in atrial branch; and total occlusion of the RCA at its origin.  Patent LIMA graft supplying the mid LAD with mild 40% narrowing in the mid distal LAD.  Patent SVG supplying the circumflex marginal vessel  SVG supplying the PDA has a focal  85% distal graft stenosis.  Low normal LV function with an ejection fraction of 50-55% with subtle mild upper mid anterolateral hypocontractility.  Successful PCI to the SVG supplying the PDA with ultimate insertion of a 3.512 mm Synergy stent postdilated to 3.78 mm with the 85% stenosis being reduced to 0%.  RECOMMENDATION: The patient received a Synergy stent.  She will require dual antiplatelet therapy with aspirin and  Plavix.  Prior to intervention, there was discussion with Dr. Randie Heinz about deferring her planned vascular surgery for next week for minimum of a month, but ultimate decision regarding timing will be necessary.   Nuclear stress test 04/13/16:  Nuclear stress EF: 52%.  No T wave inversion was noted during stress.  Downsloping ST segment depression ST segment depression of 2 mm was noted during stress in the II, III, aVF, V6, V5 and V4 leads.  Defect 1: There is a medium defect of moderate severity.  Findings consistent with ischemia.  This is an intermediate risk study.   Medium size, moderate intensity reversible (SDS 5) mid-anterior, anteroseptal and anterolateral perfusion defect suggestive of ischemia. LVEF 52% with normal wall motion. This is an intermediate risk study.   Echocardiogram 04/13/16: Study Conclusions - Left ventricle: The cavity size was normal. Wall thickness was   increased in a pattern of mild LVH. Systolic function was normal.   The estimated ejection fraction was in the range of 55% to 60%.   Wall motion was normal; there were no regional wall motion   abnormalities. Features are consistent with a pseudonormal left   ventricular filling pattern, with concomitant abnormal relaxation   and increased filling pressure (grade 2 diastolic dysfunction). - Left atrium: The atrium was moderately dilated. - Atrial septum: No defect or patent foramen ovale was identified.    Patient Profile     66 y.o. female with a PMH significant for CAD s/p stenting of left Cx (2000) and CABG (2006) with LIMA to LAD, SVG to OM, and SVG to PDA., PVD, HTN, and DM. She presented to clinic for preop evaluation for popliteal bypass and was found to have an abnormal nuclear stress test.   Assessment & Plan    1. CAD s/p PCI in 2000, CABG in 2006. Nuclear stress test with ischemia. She underwent left heat cath yesterday via right femoral artery with stenting of the SVG supplying the PDA. She  will require DAPT with ASA and plavix for one year. - nitro drip still running, she denies chest pain; imdur 30 mg starting today - will need to D/C nitro drip with imdur in place - continue ASA and plavix; imdur and lopressor - encouraged her to ambulate in halls today  2. PVD - will defer to vascular surgery for timing of her popliteal bypass while on DAPT - ideally 1 year.  3. HTN - continue home meds  4. DM - return to home regimen    Signed, Marcelino Duster ,  PA-C 8:41 AM 04/16/2016 Pager: [phone] As above, patient seen and examined. She denies chest pain or dyspnea. Right groin with no hematoma and no bruit. Patient can be discharged this morning. Continue aspirin, Plavix for 1 year and statin. Blood pressure is elevated. Discontinue IV nitroglycerin. Resume lisinopril 10 mg daily and HCTZ 12.5 mg daily. Follow blood pressure as an outpatient and adjust regimen as needed. Hold Glucophage for 48 hours following procedure. Her upcoming peripheral vascular surgery will need to be delayed given recent surgery. Follow-up Dr. Elease Hashimoto. >30 min PA and physician time D2 Olga Millers, MD

## 2016-04-16 NOTE — Progress Notes (Signed)
Noted that patient takes Lantus 30 units every HS and Metformin at home.  Recommend starting Novolog SENSITIVE correction scale TID & HS and adding Lantus 12-15 units daily.   According to medications taken yesterday before admission, the patient took Lantus 15 units at 10:34 am and blood sugars have been less than 200 mg/dl.  Will continue to monitor blood sugars while in the hospital.   Smith Mince RN BSN CDE Diabetes Coordinator Pager: [phone]  8am-5pm

## 2016-04-16 NOTE — Telephone Encounter (Signed)
Patient should f/u with Dr. Elease Hashimoto for surgical clearance.

## 2016-04-16 NOTE — Discharge Summary (Signed)
Discharge Summary    Patient ID: Andrea [name],  MRN: [id number], DOB/AGE: [birth year] 66 y.o.  Admit date: 04/15/2016 Discharge date: 04/16/2016   Primary Care Provider: Arnoldo Morale Primary Cardiologist: Dr. Acie Fredrickson  Discharge Diagnoses    Active Problems:   Coronary artery disease involving coronary bypass graft of native heart with angina pectoris [facility])   CAD (coronary artery disease) of artery bypass graft   Allergies Allergies  Allergen Reactions  . Tramadol Hcl Itching  . Naproxen Itching     History of Present Illness     Andrea [name] presents back to clinic today, as advised by Dr. Acie Fredrickson, to discuss indication for [facility], given recent abnormal NST. She is a 66 y/o female with a h/o CAD - hx of stenting 2000 and CABG 2006 (Dr. Cyndia Bent), PVD, HTN and DM. She was recently seen by Dr. Acie Fredrickson on 04/07/16 for pre-operative assessment/clearance prior to anticipated below the knee popliteal bypass for PVD, which is tentatively scheduled for 04/20/16 with Dr. Donzetta Matters. As part of her evaluation, Dr. Cathie Olden order a NST. This demonstrated a medium size, moderate intensity reversible (SDS 5) mid-anterior, anteroseptal and anterolateral perfusion defect suggestive of ischemia. LVEF 52% with normal wall motion. She will need definitive LHC prior to clearance.   Andrea [name] presented for heart catheterization on 04/15/16.   Hospital Course     Pt tolerated the procedure well. Right femoral access point with bruit, but without hematoma. She has ambulated in halls with assistants without chest pain. Ntiro drip was discontinued. Her only complaint was her normal foot pain.  She was discharged on ASA and plavix. She will require DAPT for at least one year. She was started on lopressor; her home lisinopril and HCTZ were resumed. She will monitor her BP at home.   She was instructed to restart metformin 48 hr after discharge.  Will defer to vascular surgery for timing of her popliteal  bypass.  Consultants: None  Patient seen and examined by Dr. Stanford Breed today and was stable for discharge. All follow up has been arranged.  _____________  Discharge Vitals Blood pressure (!) 146/54, pulse 85, temperature 98.4 F (36.9 C), temperature source Oral, resp. rate 15, height 5' 2"  (1.575 m), weight 142 lb 12.8 oz (64.8 kg), SpO2 92 %.  Filed Weights   04/15/16 0931 04/16/16 0337  Weight: 144 lb (65.3 kg) 142 lb 12.8 oz (64.8 kg)    Labs & Radiologic Studies    CBC  Recent Labs  04/14/16 1431 04/16/16 0400  WBC 12.1* 9.6  HGB  --  10.8*  HCT 37.0 33.7*  MCV 88 92.6  PLT 326 270   Basic Metabolic Panel  Recent Labs  04/14/16 1431 04/16/16 0400  NA 140 138  K 4.0 3.8  CL 98 102  CO2 26 26  GLUCOSE 178* 210*  BUN 19 15  CREATININE 0.55* 0.56  CALCIUM 8.8 8.6*   Liver Function Tests No results for input(s): AST, ALT, ALKPHOS, BILITOT, PROT, ALBUMIN in the last 72 hours. No results for input(s): LIPASE, AMYLASE in the last 72 hours. Cardiac Enzymes No results for input(s): CKTOTAL, CKMB, CKMBINDEX, TROPONINI in the last 72 hours. BNP Invalid input(s): POCBNP D-Dimer No results for input(s): DDIMER in the last 72 hours. Hemoglobin A1C No results for input(s): HGBA1C in the last 72 hours. Fasting Lipid Panel No results for input(s): CHOL, HDL, LDLCALC, TRIG, CHOLHDL, LDLDIRECT in the last 72 hours. Thyroid Function Tests No results for input(s): TSH, T4TOTAL,  T3FREE, THYROIDAB in the last 72 hours.  Invalid input(s): FREET3 _____________  No results found.   Diagnostic Studies/Procedures    Heart catheterization 04/15/16: Severe native CAD with 95% very proximal stenosis of the LAD before small first diagonal branch with total occlusion of the LAD after this diagonal; patent stent at the ostium of the circumflex with in-stent narrowing of 30% with total occlusion of the OM vessel after a small AV groove circumflex in atrial branch; and total  occlusion of the RCA at its origin.  Patent LIMA graft supplying the mid LAD with mild 40% narrowing in the mid distal LAD.  Patent SVG supplying the circumflex marginal vessel  SVG supplying the PDA has a focal 85% distal graft stenosis.  Low normal LV function with an ejection fraction of 50-55% with subtle mild upper mid anterolateral hypocontractility.  Successful PCI to the SVG supplying the PDA with ultimate insertion of a 3.512 mm Synergy stent postdilated to 3.78 mm with the 85% stenosis being reduced to 0%.  RECOMMENDATION: The patient received a Synergy stent. She will require dual antiplatelet therapy with aspirin and Plavix. Prior to intervention, there was discussion with Dr. Donzetta Matters about deferring her planned vascular surgery for next week for minimum of a month, but ultimate decision regarding timing will be necessary.   Nuclear stress test 04/13/16:  Nuclear stress EF: 52%.  No T wave inversion was noted during stress.  Downsloping ST segment depression ST segment depression of 2 mm was noted during stress in the II, III, aVF, V6, V5 and V4 leads.  Defect 1: There is a medium defect of moderate severity.  Findings consistent with ischemia.  This is an intermediate risk study.  Medium size, moderate intensity reversible (SDS 5) mid-anterior, anteroseptal and anterolateral perfusion defect suggestive of ischemia. LVEF 52% with normal wall motion. This is an intermediate risk study.   Echocardiogram 04/13/16: Study Conclusions - Left ventricle: The cavity size was normal. Wall thickness was increased in a pattern of mild LVH. Systolic function was normal. The estimated ejection fraction was in the range of 55% to 60%. Wall motion was normal; there were no regional wall motion abnormalities. Features are consistent with a pseudonormal left ventricular filling pattern, with concomitant abnormal relaxation and increased filling pressure (grade 2  diastolic dysfunction). - Left atrium: The atrium was moderately dilated. - Atrial septum: No defect or patent foramen ovale was identified.    Disposition   Pt is being discharged home today in good condition.  Follow-up Plans & Appointments    Follow-up Information    Chanetta Marshall, NP Follow up on 05/01/2016.   Specialty:  Cardiology Why:  8:00AM for [facility] and hospital follow up Contact information: [place] [address] [phone]          Discharge Instructions    Amb Referral to Cardiac Rehabilitation    Complete by:  As directed    Diagnosis:  Coronary Stents   Diet - low sodium heart healthy    Complete by:  As directed    Discharge instructions    Complete by:  As directed    PLEASE REMEMBER TO BRING ALL OF YOUR MEDICATIONS TO [facility].  PLEASE ATTEND ALL SCHEDULED FOLLOW-UP APPOINTMENTS.   Activity: Increase activity slowly as tolerated. You may shower, but no soaking baths (or swimming) for 1 week. No driving for 24 hours. No lifting over 5 lbs for 1 week. No sexual activity for 1 week.  You May Return to Work: in 1 week (if applicable)  Wound Care: You may wash cath site gently with soap and water. Keep cath site clean and dry. If you notice pain, swelling, bleeding or pus at your cath site, please call [phone].   Increase activity slowly    Complete by:  As directed       Discharge Medications   Current Discharge Medication List    START taking these medications   Details  clopidogrel (PLAVIX) 75 MG tablet Take 1 tablet (75 mg total) by mouth daily with breakfast. Qty: 90 tablet, Refills: 3    metoprolol tartrate (LOPRESSOR) 25 MG tablet Take 0.5 tablets (12.5 mg total) by mouth 2 (two) times daily. Qty: 90 tablet, Refills: 3      CONTINUE these medications which have CHANGED   Details  metFORMIN (GLUCOPHAGE) 1000 MG tablet Take 1 tablet (1,000 mg total) by mouth 2 (two) times daily. Restart 48  hrs after discharge. Qty: 60 tablet, Refills: 3      CONTINUE these medications which have NOT CHANGED   Details  albuterol (PROVENTIL HFA;VENTOLIN HFA) 108 (90 Base) MCG/ACT inhaler Inhale 2 puffs into the lungs every 6 (six) hours as needed for wheezing or shortness of breath. Qty: 54 Inhaler, Refills: 3    aspirin EC 81 MG tablet Take 1 tablet (81 mg total) by mouth daily. Qty: 90 tablet, Refills: 3    atorvastatin (LIPITOR) 80 MG tablet Take 1 tablet (80 mg total) by mouth daily. Qty: 90 tablet, Refills: 3    Blood Glucose Monitoring Suppl (TRUE METRIX GO GLUCOSE METER) w/Device KIT 1 each by Does not apply route every 8 (eight) hours as needed. Qty: 1 kit, Refills: 0    gabapentin (NEURONTIN) 300 MG capsule Take 600 mg by mouth 3 (three) times daily.  Refills: 3    glucose blood (TRUE METRIX BLOOD GLUCOSE TEST) test strip Use as instructed Qty: 100 each, Refills: 12    hydrochlorothiazide (HYDRODIURIL) 25 MG tablet Take 1 tablet (25 mg total) by mouth daily. Qty: 90 tablet, Refills: 3   Associated Diagnoses: Essential hypertension    Insulin Glargine (LANTUS SOLOSTAR) 100 UNIT/ML Solostar Pen Inject 30 Units into the skin daily at 10 pm. Qty: 30 mL, Refills: 3   Associated Diagnoses: DM type 2 with diabetic peripheral neuropathy (HCC)    Insulin Pen Needle (ULTICARE MICRO PEN NEEDLES) 32G X 4 MM MISC 1 applicator by Does not apply route at bedtime. Qty: 100 each, Refills: 11    lisinopril (PRINIVIL,ZESTRIL) 10 MG tablet Take 1 tablet (10 mg total) by mouth daily. Qty: 90 tablet, Refills: 3    omeprazole (PRILOSEC) 20 MG capsule TAKE 1 CAPSULE BY MOUTH DAILY. Qty: 30 capsule, Refills: 3   Associated Diagnoses: Gastroesophageal reflux disease without esophagitis    oxybutynin (DITROPAN) 5 MG tablet TAKE 1 TABLET BY MOUTH 2 TIMES DAILY. Qty: 60 tablet, Refills: 2   Associated Diagnoses: Overactive bladder    pregabalin (LYRICA) 100 MG capsule Take 1 capsule (100 mg  total) by mouth 2 (two) times daily. Qty: 60 capsule, Refills: 3   Associated Diagnoses: Other diabetic neurological complication associated with type 2 diabetes mellitus (HCC)    TRUEPLUS LANCETS 26G MISC 1 each by Does not apply route every 8 (eight) hours as needed. Qty: 100 each, Refills: 12    carbamide peroxide (DEBROX) 6.5 % otic solution Place 5 drops into both ears daily as needed (WAX BUILDUP).     Fluticasone-Salmeterol (  ADVAIR) 100-50 MCG/DOSE AEPB Inhale 1 puff into the lungs 2 (two) times daily. Qty: 180 each, Refills: 3         Aspirin prescribed at discharge?  Yes High Intensity Statin Prescribed? (Lipitor 40-52m or Crestor 20-495m: Yes Beta Blocker Prescribed? Yes For EF <40%, was ACEI/ARB Prescribed? No: EF > 40% ADP Receptor Inhibitor Prescribed? (i.e. Plavix etc.-Includes Medically Managed Patients): Yes For EF <40%, Aldosterone Inhibitor Prescribed? No: EF > 40% Was EF assessed during THIS hospitalization? No: 04/13/16 Was Cardiac Rehab II ordered? (Included Medically managed Patients): Yes   Outstanding Labs/Studies   None  Duration of Discharge Encounter   Greater than 30 minutes including physician time.  Signed, AnTami Linuke PA-C 04/16/2016, 10:43 AM

## 2016-04-16 NOTE — Care Management Note (Signed)
Case Management Note  Patient Details  Name: Nasrin Lanzo MRN: [id number] Date of Birth: May 20, 1950  Subjective/Objective:   CAD, stent placement               Action/Plan: Discharge Planning: Chart reviewed. No NCM needs identified.    Expected Discharge Date:  04/16/16               Expected Discharge Plan:  Home/Self Care  In-House Referral:  NA  Discharge planning Services  CM Consult  Post Acute Care Choice:  NA Choice offered to:  NA  DME Arranged:  N/A DME Agency:  NA  HH Arranged:  NA HH Agency:  NA  Status of Service:  Completed, signed off  If discussed at Long Length of Stay Meetings, dates discussed:    Additional Comments:  Elliot Cousin, RN 04/16/2016, 11:09 AM

## 2016-04-16 NOTE — Plan of Care (Signed)
Problem: Activity: Goal: Ability to return to baseline activity level will improve Outcome: Completed/Met Date Met: 04/16/16 Pt educated on cardiac rehab as well as activity limitations for discharge. Education was verified using teachback method.  Problem: Education: Goal: Understanding of CV disease, CV risk reduction, and recovery process will improve Outcome: Adequate for Discharge Pt educated on site care with a  Handout given.

## 2016-04-16 NOTE — Progress Notes (Signed)
CARDIAC REHAB PHASE I   PRE:  Rate/Rhythm: 76 SR  BP:  Supine:   Sitting: 134/47  Standing:    SaO2:   MODE:  Ambulation: 120 ft   POST:  Rate/Rhythm: 88 SR  BP:  Supine:   Sitting: 117/55  Standing:    SaO2: 94%RA 0925-1030 Pt walked 120 ft on RA with her cane and minimal asst. A little wobbly at times. Pt became tearful during walk. Would recommend PT consult to evaluate for home needs and home PT. Pt uses a cane but may be steadier with walker. Will let PT evaluate. Education completed stressing importance of plavix with stent. Discussed carb counting and heart healthy food choices. Pt has quit smoking for a month cold [place]. Gave fake cigarette and smoking cessation handout. Congratulated pt on her one month success. Pt only able to walk short distances at a time so did not give ex ed. Will refer to [place] CRP 2 but pt will not be able to tolerate until her vascular surgery done. To recliner with call bell and chair alarm. Pt did not have CP during walk. Legs tingled. Reviewed NTG use also.   Luetta Nutting, RN BSN  04/16/2016 10:26 AM

## 2016-04-16 NOTE — Telephone Encounter (Signed)
Patient scheduled for a TOC appointment on 05-01-16 with Gypsy Balsam at 8:00Am.

## 2016-04-16 NOTE — Plan of Care (Signed)
Problem: Health Behavior/Discharge Planning: Goal: Ability to safely manage health-related needs after discharge will improve Outcome: Completed/Met Date Met: 04/16/16 Case Manager consulted for pt needed brilinta & ASA at d/c.

## 2016-04-16 NOTE — Discharge Instructions (Signed)
Coronary Angiogram With Stent, Care After °This sheet gives you information about how to care for yourself after your procedure. Your health care provider may also give you more specific instructions. If you have problems or questions, contact your health care provider. °What can I expect after the procedure? °After your procedure, it is common to have: °· Bruising in the area where a small, thin tube (catheter) was inserted. This usually fades within 1-2 weeks. °· Blood collecting in the tissue (hematoma) that may be painful to the touch. It should usually decrease in size and tenderness within 1-2 weeks. °Follow these instructions at home: °Insertion area care  °· Do not take baths, swim, or use a hot tub until your health care provider approves. °· You may shower 24-48 hours after the procedure or as directed by your health care provider. °· Follow instructions from your health care provider about how to take care of your incision. Make sure you: °¨ Wash your hands with soap and water before you change your bandage (dressing). If soap and water are not available, use hand sanitizer. °¨ Change your dressing as told by your health care provider. °¨ Leave stitches (sutures), skin glue, or adhesive strips in place. These skin closures may need to stay in place for 2 weeks or longer. If adhesive strip edges start to loosen and curl up, you may trim the loose edges. Do not remove adhesive strips completely unless your health care provider tells you to do that. °· Remove the bandage (dressing) and gently wash the catheter insertion site with plain soap and water. °· Pat the area dry with a clean towel. Do not rub the area, because that may cause bleeding. °· Do not apply powder or lotion to the incision area. °· Check your incision area every day for signs of infection. Check for: °¨ More redness, swelling, or pain. °¨ More fluid or blood. °¨ Warmth. °¨ Pus or a bad smell. °Activity  °· Do not drive for 24 hours if you  were given a medicine to help you relax (sedative). °· Do not lift anything that is heavier than 10 lb (4.5 kg) for 5 days after your procedure or as directed by your health care provider. °· Ask your health care provider when it is okay for you: °¨ To return to work or school. °¨ To resume usual physical activities or sports. °¨ To resume sexual activity. °Eating and drinking  °· Eat a heart-healthy diet. This should include plenty of fresh fruits and vegetables. °· Avoid the following types of food: °¨ Food that is high in salt. °¨ Canned or highly processed food. °¨ Food that is high in saturated fat or sugar. °¨ Fried food. °· Limit alcohol intake to no more than 1 drink a day for non-pregnant women and 2 drinks a day for men. One drink equals 12 oz of beer, 5 oz of wine, or 1½ oz of hard liquor. °Lifestyle  °· Do not use any products that contain nicotine or tobacco, such as cigarettes and e-cigarettes. If you need help quitting, ask your health care provider. °· Take steps to manage and control your weight. °· Get regular exercise. °· Manage your blood pressure. °· Manage other health problems, such as diabetes. °General instructions  °· Take over-the-counter and prescription medicines only as told by your health care provider. Blood thinners may be prescribed after your procedure to improve blood flow through the stent. °· If you need an MRI after your heart stent   has been placed, be sure to tell the health care provider who orders the MRI that you have a heart stent. °· Keep all follow-up visits as directed by your health care provider. This is important. °Contact a health care provider if: °· You have a fever. °· You have chills. °· You have increased bleeding from the catheter insertion area. Hold pressure on the area. °Get help right away if: °· You develop chest pain or shortness of breath. °· You feel faint or you pass out. °· You have unusual pain at the catheter insertion area. °· You have redness,  warmth, or swelling at the catheter insertion area. °· You have drainage (other than a small amount of blood on the dressing) from the catheter insertion area. °· The catheter insertion area is bleeding, and the bleeding does not stop after 30 minutes of holding steady pressure on the area. °· You develop bleeding from any other place, such as from your rectum. There may be bright red blood in your urine or stool, or it may appear as black, tarry stool. °This information is not intended to replace advice given to you by your health care provider. Make sure you discuss any questions you have with your health care provider. °Document Released: 07/18/2004 Document Revised: 09/26/2015 Document Reviewed: 09/26/2015 °Elsevier Interactive Patient Education © 2017 [employer] ° °

## 2016-04-16 NOTE — Plan of Care (Signed)
Problem: Cardiovascular: Goal: Vascular access site(s) Level 0-1 will be maintained Outcome: Completed/Met Date Met: 04/16/16 Site is stable level 0

## 2016-04-17 ENCOUNTER — Telehealth: Payer: Self-pay | Admitting: Cardiovascular Disease

## 2016-04-17 NOTE — Telephone Encounter (Signed)
New Message     Pt is returning Michelles call

## 2016-04-17 NOTE — Telephone Encounter (Signed)
Left message for patient to call back  

## 2016-04-17 NOTE — Telephone Encounter (Signed)
Left detailed message for patient about [facility] appointment with Dr. Elease Hashimoto on 4/18 and asked her to call back to let me know how she is doing

## 2016-04-20 ENCOUNTER — Telehealth (HOSPITAL_COMMUNITY): Payer: Self-pay | Admitting: Family Medicine

## 2016-04-20 NOTE — Telephone Encounter (Signed)
Patient insurance is active and verified benefits. Patient has BCBS - no co-payment, deductible $400/$400 has been met, out pocket $800/$800 has been met, *30% co-insurance. Patient has met deductible and out of pocket - no co-insurance, no pre-authorization and no limit on visit. Passport/reference [phone]. Information given to [facility] for review.

## 2016-04-23 ENCOUNTER — Encounter (HOSPITAL_COMMUNITY): Payer: Self-pay

## 2016-04-23 ENCOUNTER — Other Ambulatory Visit (HOSPITAL_COMMUNITY): Payer: Self-pay

## 2016-04-25 NOTE — Progress Notes (Signed)
Cardiology Office Note   Date:  04/29/2016   ID:  Andrea [name], DOB 06-21-1950, MRN [id number]  PCP:  Arnoldo Morale, MD  Cardiologist:   Mertie Moores, MD   Problem list 1. Peripheral arterial disease 2. Essential hypertension 3. Diabetes mellitus 4. CAD - hx of  stenting 2000 and CABG 2006 ( [place])    Chief Complaint  Patient presents with  . Coronary Artery Disease      History of Present Illness: Andrea [name] is a 66 y.o. female who presents for pre operative evaluation prior to possible PV surgery .   She has some DOE walking up a hill or up stairs.  Has had CABG and a previous stent   Quit smoking a month ago .  Has not had any angina  Does not get regular exercise - is severely limited by leg pain .  She's had lots of issues with her peripheral arterial disease. She has several nonhealing wounds on her legs. She's had an angiogram by Dr. Donzetta Matters and he has set up this cardiology assessment. I'm assuming that this is for preoperative evaluation.  April 29, 2016:  Ambra returns for further management of her CAD She has severe PVD and needs to have PV surgery  Preoperatively, she had an ischemic work up that showed the following :  Myoview showed a reversible defect in the anterior wall.  Cath April 13, 2016 showed total occlusions of her native Coronaries.   IMA to LAD was patent. SVG to OM patent.  SVG to distal RCA had a tight stenosis that was stented with a 3.5 x 12 Synergy stent.   Post dilated to 3.78 mm.  She is feeling better.  Had nausea and vomitting for the first 5 days after she got home.   Has improved   Has stopped smoking  Vascular surgery has been rescheduled for May 10.    Past Medical History:  Diagnosis Date  . Coronary artery disease   . Diabetes mellitus without complication ([place])   . Fibromyalgia   . PVD (peripheral vascular disease) ([place])     Past Surgical History:  Procedure Laterality Date  . ABDOMINAL AORTOGRAM W/LOWER  EXTREMITY N/A 03/25/2016   Procedure: Abdominal Aortogram w/Lower Extremity;  Surgeon: Waynetta Sandy, MD;  Location: [facility];  Service: Cardiovascular;  Laterality: N/A;  . CORONARY ARTERY BYPASS GRAFT    . CORONARY STENT INTERVENTION N/A 04/15/2016   Procedure: Coronary Stent Intervention;  Surgeon: Troy Sine, MD;  Location: [facility];  Service: Cardiovascular;  Laterality: N/A;  . KNEE SURGERY    . LEFT HEART CATH AND CORS/GRAFTS ANGIOGRAPHY N/A 04/15/2016   Procedure: Left Heart Cath and Cors/Grafts Angiography;  Surgeon: Troy Sine, MD;  Location: [facility];  Service: Cardiovascular;  Laterality: N/A;     Current Outpatient Prescriptions  Medication Sig Dispense Refill  . albuterol (PROVENTIL HFA;VENTOLIN HFA) 108 (90 Base) MCG/ACT inhaler Inhale 2 puffs into the lungs every 6 (six) hours as needed for wheezing or shortness of breath. 54 Inhaler 3  . aspirin EC 81 MG tablet Take 1 tablet (81 mg total) by mouth daily. 90 tablet 3  . atorvastatin (LIPITOR) 80 MG tablet Take 1 tablet (80 mg total) by mouth daily. 90 tablet 3  . Blood Glucose Monitoring Suppl (TRUE METRIX GO GLUCOSE METER) w/Device KIT 1 each by Does not apply route every 8 (eight) hours as needed. (Patient taking differently: 1 each by Does  not apply route daily. ) 1 kit 0  . carbamide peroxide (DEBROX) 6.5 % otic solution Place 5 drops into both ears daily as needed (WAX BUILDUP).     . clopidogrel (PLAVIX) 75 MG tablet Take 1 tablet (75 mg total) by mouth daily with breakfast. 90 tablet 3  . Fluticasone-Salmeterol (ADVAIR) 100-50 MCG/DOSE AEPB Inhale 1 puff into the lungs 2 (two) times daily. 180 each 3  . gabapentin (NEURONTIN) 300 MG capsule Take 600 mg by mouth 3 (three) times daily.   3  . glucose blood (TRUE METRIX BLOOD GLUCOSE TEST) test strip Use as instructed (Patient taking differently: 1 each by Other route daily. Use as instructed) 100 each 12  . hydrochlorothiazide  (HYDRODIURIL) 25 MG tablet Take 1 tablet (25 mg total) by mouth daily. 90 tablet 3  . Insulin Glargine (LANTUS SOLOSTAR) 100 UNIT/ML Solostar Pen Inject 30 Units into the skin daily at 10 pm. 30 mL 3  . Insulin Pen Needle (ULTICARE MICRO PEN NEEDLES) 32G X 4 MM MISC 1 applicator by Does not apply route at bedtime. (Patient taking differently: 1 applicator by Does not apply route daily. ) 100 each 11  . lisinopril (PRINIVIL,ZESTRIL) 10 MG tablet Take 1 tablet (10 mg total) by mouth daily. 90 tablet 3  . metFORMIN (GLUCOPHAGE) 1000 MG tablet Take 1 tablet (1,000 mg total) by mouth 2 (two) times daily. Restart 48 hrs after discharge. 60 tablet 3  . metoprolol tartrate (LOPRESSOR) 25 MG tablet Take 0.5 tablets (12.5 mg total) by mouth 2 (two) times daily. 90 tablet 3  . omeprazole (PRILOSEC) 20 MG capsule TAKE 1 CAPSULE BY MOUTH DAILY. 30 capsule 3  . oxybutynin (DITROPAN) 5 MG tablet TAKE 1 TABLET BY MOUTH 2 TIMES DAILY. 60 tablet 2  . pregabalin (LYRICA) 100 MG capsule Take 1 capsule (100 mg total) by mouth 2 (two) times daily. 60 capsule 3  . TRUEPLUS LANCETS 26G MISC 1 each by Does not apply route every 8 (eight) hours as needed. (Patient taking differently: 1 each by Other route daily. ) 100 each 12   No current facility-administered medications for this visit.     No flowsheet data found.    Allergies:   Tramadol hcl and Naproxen    Social History:  The patient  reports that she has quit smoking. Her smoking use included Cigarettes. She smoked 0.25 packs per day. She has never used smokeless tobacco. She reports that she does not drink alcohol or use drugs.   Family History:  The patient's family history includes Stroke in her mother.    ROS:  Please see the history of present illness.    Review of Systems: Constitutional:  denies fever, chills, diaphoresis, appetite change and fatigue.  HEENT: denies photophobia, eye pain, redness, hearing loss, ear pain, congestion, sore throat,  rhinorrhea, sneezing, neck pain, neck stiffness and tinnitus.  Respiratory: denies SOB, DOE, cough, chest tightness, and wheezing.  Cardiovascular: denies chest pain, palpitations and leg swelling.  Gastrointestinal: denies nausea, vomiting, abdominal pain, diarrhea, constipation, blood in stool.  Genitourinary: denies dysuria, urgency, frequency, hematuria, flank pain and difficulty urinating.  Musculoskeletal: denies  myalgias, back pain, joint swelling, arthralgias and gait problem.   Skin: denies pallor, rash and wound.  Neurological: denies dizziness, seizures, syncope, weakness, light-headedness, numbness and headaches.   Hematological: denies adenopathy, easy bruising, personal or family bleeding history.  Psychiatric/ Behavioral: denies suicidal ideation, mood changes, confusion, nervousness, sleep disturbance and agitation.  All other systems are reviewed and negative.    PHYSICAL EXAM: VS:  BP 100/60 (BP Location: Right Arm, Patient Position: Sitting, Cuff Size: Normal)   Pulse 60   Ht _0  (1.575 m)   Wt 138 lb 1.9 oz (62.7 kg)   SpO2 99%   BMI 25.26 kg/m  , BMI Body mass index is 25.26 kg/m. GEN:  Chronically ill appearing ,middle aged female,  HEENT: normal  Neck: no JVD, loud left carotid bruit, or masses Cardiac: RR;  Soft systolic  murmur, no rubs, or gallops,no edema  Respiratory:  clear to auscultation bilaterally, normal work of breathing GI: soft, nontender, nondistended, + BS MS: no deformity or atrophy  Skin: warm and dry, no rash Neuro:  Strength and sensation are intact Psych: normal  EKG:  Personally reviewed   March 24, 2016:   NSR , lateral TWI.     Recent Labs: 10/16/2015: TSH 0.60 04/16/2016: BUN 15; Creatinine, Ser 0.56; Hemoglobin 10.8; Platelets 256; Potassium 3.8; Sodium 138    Lipid Panel    Component Value Date/Time   CHOL 309 (H) 10/16/2015 1200   TRIG 258 (H) 10/16/2015 1200   HDL 56 10/16/2015 1200   CHOLHDL 5.5 (H)  10/16/2015 1200   VLDL 52 (H) 10/16/2015 1200   LDLCALC 201 (H) 10/16/2015 1200      Wt Readings from Last 3 Encounters:  04/29/16 138 lb 1.9 oz (62.7 kg)  04/16/16 142 lb 12.8 oz (64.8 kg)  04/14/16 144 lb (65.3 kg)      Other studies Reviewed: Additional studies/ records that were reviewed today include: . Review of the above records demonstrates:    ASSESSMENT AND PLAN:  1.  CAD:   Hx of stenting of LCx in 2000 and hx of CABG in 2006.    Myoview showed a reversible defect in the anterior wall.  Cath April 13, 2016 showed total occlusions of her native Coronaries.   IMA to LAD was patent. SVG to OM patent.  SVG to distal RCA had a tight stenosis that was stented with a 3.5 x 12 Synergy stent.   Post dilated to 3.78 mm.  Continue atorvastatin   Has vascular surgery scheduled on May 10. She will be only about a month out from her DES stenting but will need to hold the Plavix prior to her vascular surgery . This does put her at increased risk for subacute stent thrombosis but she is at risk of losing her legs and needs to have the vascular surgery . I will agree to have her hold her plavix for up to 5 days prior to her procedure and restart as soon as possible after the surgery .   2. PAD :  Has nonhealing wounds on both legs.   Further eval and management per Dr. Donzetta Matters.  3.  Hyperlipidemia :  Her last lipid profile showed Chol = 309 HDL = 56 LDL = 201 Trigs = 258 We started Atorvastatin at that time  Recheck in 3 months   Current medicines are reviewed at length with the patient today.  The patient does not have concerns regarding medicines.  Labs/ tests ordered today include:  No orders of the defined types were placed in this encounter.    Disposition:   FU with me in 3-4  months      Mertie Moores, MD  04/29/2016 11:53 AM    [address], [place], [place]  [address] Phone: [phone];  Fax: [phone]

## 2016-04-29 ENCOUNTER — Encounter: Payer: Self-pay | Admitting: Cardiovascular Disease

## 2016-04-29 ENCOUNTER — Ambulatory Visit (INDEPENDENT_AMBULATORY_CARE_PROVIDER_SITE_OTHER): Payer: BLUE CROSS/BLUE SHIELD | Admitting: Cardiovascular Disease

## 2016-04-29 ENCOUNTER — Encounter (INDEPENDENT_AMBULATORY_CARE_PROVIDER_SITE_OTHER): Payer: Self-pay

## 2016-04-29 VITALS — BP 100/60 | HR 60 | Ht 62.0 in | Wt 138.1 lb

## 2016-04-29 DIAGNOSIS — I739 Peripheral vascular disease, unspecified: Secondary | ICD-10-CM

## 2016-04-29 DIAGNOSIS — I2510 Atherosclerotic heart disease of native coronary artery without angina pectoris: Secondary | ICD-10-CM | POA: Diagnosis not present

## 2016-04-29 DIAGNOSIS — I10 Essential (primary) hypertension: Secondary | ICD-10-CM

## 2016-04-29 NOTE — Patient Instructions (Signed)
Medication Instructions:  Your physician recommends that you continue on your current medications as directed. Please refer to the Current Medication list given to you today.   Labwork: Your physician recommends that you return for lab work in: 3 months on the day of or a few days before your office visit with Dr. Nahser.  You will need to FAST for this appointment - nothing to eat or drink after midnight the night before except water.   Testing/Procedures: None Ordered   Follow-Up: Your physician recommends that you schedule a follow-up appointment in: 3 months with Dr. Nahser   If you need a refill on your cardiac medications before your next appointment, please call your pharmacy.   Thank you for choosing [facility]! Ashtynn Berke, RN [phone]    

## 2016-05-01 ENCOUNTER — Ambulatory Visit: Payer: Self-pay | Admitting: Nurse Practitioner

## 2016-05-04 MED FILL — GABAPENTIN 300 MG CAPSULE: 300 | 30 days supply | Qty: 180 | Fill #2

## 2016-05-13 ENCOUNTER — Encounter (HOSPITAL_COMMUNITY)
Admission: RE | Admit: 2016-05-13 | Discharge: 2016-05-13 | Disposition: A | Discharge status: Home / Self Care | Payer: BLUE CROSS/BLUE SHIELD | Source: Ambulatory Visit | Attending: Vascular Surgery | Admitting: Vascular Surgery

## 2016-05-13 ENCOUNTER — Encounter (HOSPITAL_COMMUNITY): Payer: Self-pay

## 2016-05-13 ENCOUNTER — Other Ambulatory Visit: Payer: Self-pay

## 2016-05-13 DIAGNOSIS — Z01818 Encounter for other preprocedural examination: Secondary | ICD-10-CM | POA: Insufficient documentation

## 2016-05-13 DIAGNOSIS — Z951 Presence of aortocoronary bypass graft: Secondary | ICD-10-CM | POA: Insufficient documentation

## 2016-05-13 DIAGNOSIS — M797 Fibromyalgia: Secondary | ICD-10-CM | POA: Insufficient documentation

## 2016-05-13 DIAGNOSIS — Z7982 Long term (current) use of aspirin: Secondary | ICD-10-CM | POA: Insufficient documentation

## 2016-05-13 DIAGNOSIS — E119 Type 2 diabetes mellitus without complications: Secondary | ICD-10-CM | POA: Diagnosis not present

## 2016-05-13 DIAGNOSIS — Z7901 Long term (current) use of anticoagulants: Secondary | ICD-10-CM | POA: Diagnosis not present

## 2016-05-13 DIAGNOSIS — I10 Essential (primary) hypertension: Secondary | ICD-10-CM | POA: Diagnosis not present

## 2016-05-13 DIAGNOSIS — Z7984 Long term (current) use of oral hypoglycemic drugs: Secondary | ICD-10-CM | POA: Diagnosis not present

## 2016-05-13 DIAGNOSIS — Z87891 Personal history of nicotine dependence: Secondary | ICD-10-CM | POA: Diagnosis not present

## 2016-05-13 DIAGNOSIS — Z955 Presence of coronary angioplasty implant and graft: Secondary | ICD-10-CM | POA: Insufficient documentation

## 2016-05-13 DIAGNOSIS — E7800 Pure hypercholesterolemia, unspecified: Secondary | ICD-10-CM | POA: Diagnosis not present

## 2016-05-13 DIAGNOSIS — N390 Urinary tract infection, site not specified: Secondary | ICD-10-CM

## 2016-05-13 DIAGNOSIS — R319 Hematuria, unspecified: Principal | ICD-10-CM

## 2016-05-13 DIAGNOSIS — I739 Peripheral vascular disease, unspecified: Secondary | ICD-10-CM | POA: Insufficient documentation

## 2016-05-13 DIAGNOSIS — K219 Gastro-esophageal reflux disease without esophagitis: Secondary | ICD-10-CM | POA: Insufficient documentation

## 2016-05-13 HISTORY — DX: Unspecified urinary incontinence: R32

## 2016-05-13 HISTORY — DX: Other specified postprocedural states: R11.2

## 2016-05-13 HISTORY — DX: Frequency of micturition: R35.0

## 2016-05-13 HISTORY — DX: Other specified postprocedural states: Z98.890

## 2016-05-13 HISTORY — DX: Dyspnea, unspecified: R06.00

## 2016-05-13 HISTORY — DX: Essential (primary) hypertension: I10

## 2016-05-13 HISTORY — DX: Pure hypercholesterolemia, unspecified: E78.00

## 2016-05-13 HISTORY — DX: Gastro-esophageal reflux disease without esophagitis: K21.9

## 2016-05-13 HISTORY — DX: Unspecified asthma, uncomplicated: J45.909

## 2016-05-13 LAB — CBC
HCT: 37.9 % (ref 36.0–46.0)
Hemoglobin: 12.2 g/dL (ref 12.0–15.0)
MCH: 30.1 pg (ref 26.0–34.0)
MCHC: 32.2 g/dL (ref 30.0–36.0)
MCV: 93.6 fL (ref 78.0–100.0)
PLATELETS: 241 10*3/uL (ref 150–400)
RBC: 4.05 MIL/uL (ref 3.87–5.11)
RDW: 13.3 % (ref 11.5–15.5)
WBC: 11.9 10*3/uL — ABNORMAL HIGH (ref 4.0–10.5)

## 2016-05-13 LAB — COMPREHENSIVE METABOLIC PANEL
ALBUMIN: 3.4 g/dL — AB (ref 3.5–5.0)
ALT: 16 U/L (ref 14–54)
ANION GAP: 10 (ref 5–15)
AST: 17 U/L (ref 15–41)
Alkaline Phosphatase: 50 U/L (ref 38–126)
BUN: 15 mg/dL (ref 6–20)
CO2: 26 mmol/L (ref 22–32)
Calcium: 8.9 mg/dL (ref 8.9–10.3)
Chloride: 104 mmol/L (ref 101–111)
Creatinine, Ser: 0.72 mg/dL (ref 0.44–1.00)
GFR calc non Af Amer: >60 mL/min (ref >60)
GLUCOSE: 230 mg/dL — AB (ref 65–99)
POTASSIUM: 4.2 mmol/L (ref 3.5–5.1)
SODIUM: 140 mmol/L (ref 135–145)
Total Bilirubin: 0.3 mg/dL (ref 0.3–1.2)
Total Protein: 6.3 g/dL — ABNORMAL LOW (ref 6.5–8.1)

## 2016-05-13 LAB — GLUCOSE, CAPILLARY: GLUCOSE-CAPILLARY: 205 mg/dL — AB (ref 65–99)

## 2016-05-13 LAB — SURGICAL PCR SCREEN
MRSA, PCR: NEGATIVE
STAPHYLOCOCCUS AUREUS: NEGATIVE

## 2016-05-13 LAB — PROTIME-INR
INR: 0.93
Prothrombin Time: 12.4 seconds (ref 11.4–15.2)

## 2016-05-13 LAB — URINALYSIS, ROUTINE W REFLEX MICROSCOPIC
BILIRUBIN URINE: NEGATIVE
Glucose, UA: NEGATIVE mg/dL
Hgb urine dipstick: NEGATIVE
Ketones, ur: 5 mg/dL — AB
Nitrite: NEGATIVE
PH: 5 (ref 5.0–8.0)
Protein, ur: 100 mg/dL — AB
SPECIFIC GRAVITY, URINE: 1.027 (ref 1.005–1.030)

## 2016-05-13 LAB — ABO/RH: ABO/RH(D): A NEG

## 2016-05-13 LAB — APTT: APTT: 27 s (ref 24–36)

## 2016-05-13 MED ORDER — CIPROFLOXACIN HCL 500 MG PO TABS: 500.0000 mg | ORAL_TABLET | Freq: Two times a day (BID) | ORAL | 0 refills | Status: AC

## 2016-05-13 MED FILL — CIPROFLOXACIN HCL 500 MG TA: 500 | 7 days supply | Qty: 14 | Fill #0

## 2016-05-13 NOTE — Progress Notes (Signed)
Called and left message for Judeth Cornfield at [facility] about patient's urine

## 2016-05-13 NOTE — Progress Notes (Addendum)
PCP - enbong Amao Cardiologist - Nahser  Chest x-ray - not needed EKG - 04/16/16 Stress Test -04/13/16  ECHO - 04/13/16 Cardiac Cath -04/15/16    Fasting Blood Sugar - low 100s Checks Blood Sugar ___1__ times a day   Sending to anesthesia for review of heart history  Patient denies shortness of breath, fever, cough and chest pain at PAT appointment  Last dose of plavix to be 05/15/16   Patient verbalized understanding of instructions that was given to them at the PAT appointment. Patient expressed that there were no further questions.  Patient was also instructed that they will need to review over the PAT instructions again at home before the surgery.

## 2016-05-13 NOTE — Pre-Procedure Instructions (Signed)
Andrea [name]  05/13/2016      [facility] [place] [address] [place] [address] Phone: [phone] Fax: [phone]    Your procedure is scheduled on May 10  Report to [facility] Admitting at 0530 A.M.  Call this number if you have problems the morning of surgery:  [phone]   Remember:  Do not eat food or drink liquids after midnight.   Take these medicines the morning of surgery with A SIP OF WATER albuterol (PROVENTIL HFA;VENTOLIN HFA), Fluticasone-Salmeterol (ADVAIR),  gabapentin (NEURONTIN), metoprolol tartrate (LOPRESSOR) , omeprazole (PRILOSEC), oxybutynin (DITROPAN),  pregabalin (LYRICA)  7 days prior to surgery STOP taking any Aspirin, Aleve, Naproxen, Ibuprofen, Motrin, Advil, Goody's, BC's, all herbal medications, fish oil, and all vitamins  FOLLOW PHYSICIANS INSTRUCTIONS ABOUT PLAVIX   How to Manage Your Diabetes Before and After Surgery  Why is it important to control my blood sugar before and after surgery? . Improving blood sugar levels before and after surgery helps healing and can limit problems. . A way of improving blood sugar control is eating a healthy diet by: o  Eating less sugar and carbohydrates o  Increasing activity/exercise o  Talking with your doctor about reaching your blood sugar goals . High blood sugars (greater than 180 mg/dL) can raise your risk of infections and slow your recovery, so you will need to focus on controlling your diabetes during the weeks before surgery. . Make sure that the doctor who takes care of your diabetes knows about your planned surgery including the date and location.  How do I manage my blood sugar before surgery? . Check your blood sugar at least 4 times a day, starting 2 days before surgery, to make sure that the level is not too high or low. o Check your blood sugar the morning of your surgery when you wake up and every 2 hours until you  get to the Short Stay unit. . If your blood sugar is less than 70 mg/dL, you will need to treat for low blood sugar: o Do not take insulin. o Treat a low blood sugar (less than 70 mg/dL) with  cup of clear juice (cranberry or apple), 4 glucose tablets, OR glucose gel. o Recheck blood sugar in 15 minutes after treatment (to make sure it is greater than 70 mg/dL). If your blood sugar is not greater than 70 mg/dL on recheck, call [phone] for further instructions. . Report your blood sugar to the short stay nurse when you get to Short Stay.  . If you are admitted to the hospital after surgery: o Your blood sugar will be checked by the staff and you will probably be given insulin after surgery (instead of oral diabetes medicines) to make sure you have good blood sugar levels. o The goal for blood sugar control after surgery is 80-180 mg/dL.     WHAT DO I DO ABOUT MY DIABETES MEDICATION?   Marland Kitchen Do not take oral diabetes medicines (pills) the morning of surgery. metFORMIN (GLUCOPHAGE)  . THE NIGHT BEFORE SURGERY, take ____15_______ units of _Insulin Glargine (LANTUS SOLOSTAR) _____insulin.        Do not wear jewelry, make-up or nail polish.  Do not wear lotions, powders, or perfumes, or deoderant.  Do not shave 48 hours prior to surgery.  Men may shave face and neck.  Do not bring valuables to the hospital.  [facility] is not responsible for any  belongings or valuables.  Contacts, dentures or bridgework may not be worn into surgery.  Leave your suitcase in the car.  After surgery it may be brought to your room.  For patients admitted to the hospital, discharge time will be determined by your treatment team.  Patients discharged the day of surgery will not be allowed to drive home.    Special instructions:   [facility]- Preparing For Surgery  Before surgery, you can play an important role. Because skin is not sterile, your skin needs to be as free of germs as possible. You can  reduce the number of germs on your skin by washing with CHG (chlorahexidine gluconate) Soap before surgery.  CHG is an antiseptic cleaner which kills germs and bonds with the skin to continue killing germs even after washing.  Please do not use if you have an allergy to CHG or antibacterial soaps. If your skin becomes reddened/irritated stop using the CHG.  Do not shave (including legs and underarms) for at least 48 hours prior to first CHG shower. It is OK to shave your face.  Please follow these instructions carefully.   1. Shower the NIGHT BEFORE SURGERY and the MORNING OF SURGERY with CHG.   2. If you chose to wash your hair, wash your hair first as usual with your normal shampoo.  3. After you shampoo, rinse your hair and body thoroughly to remove the shampoo.  4. Use CHG as you would any other liquid soap. You can apply CHG directly to the skin and wash gently with a scrungie or a clean washcloth.   5. Apply the CHG Soap to your body ONLY FROM THE NECK DOWN.  Do not use on open wounds or open sores. Avoid contact with your eyes, ears, mouth and genitals (private parts). Wash genitals (private parts) with your normal soap.  6. Wash thoroughly, paying special attention to the area where your surgery will be performed.  7. Thoroughly rinse your body with warm water from the neck down.  8. DO NOT shower/wash with your normal soap after using and rinsing off the CHG Soap.  9. Pat yourself dry with a CLEAN TOWEL.   10. Wear CLEAN PAJAMAS   11. Place CLEAN SHEETS on your bed the night of your first shower and DO NOT SLEEP WITH PETS.    Day of Surgery: Do not apply any deodorants/lotions. Please wear clean clothes to the hospital/surgery center.      Please read over the following fact sheets that you were given.

## 2016-05-14 NOTE — Progress Notes (Addendum)
Anesthesia chart review: Patient is a 66 year old female scheduled for right femoral to below-knee popliteal artery bypass on 05/21/2016 by Dr. Randie Heinzain.  History includes PAD (BLE with non-healing right foot wound), recent former smoker (quit 04/04/16), CAD s/p stent '00 and CABG (LIMA-LAD, SVG-OM, SVG-PDA) '06 s/p Synergy stent SVG-PDA 04/15/16, post-operative N/V, fibromyalgia, DM2, HTN, GERD, asthma, dyspnea, hypercholesterolemia.   PCP is Dr. Jaclyn ShaggyEnobong Amao.  Cardiologist is Dr. Kristeen MissPhilip Nahser, last visit 04/29/16. He wrote, "Has vascular surgery scheduled on May 10. She will be only about a month out from her DES stenting but will need to hold the Plavix prior to her vascular surgery. This does put her at increased risk for subacute stent thrombosis but she is at risk of losing her legs and needs to have the vascular surgery. I will agree to have her hold her plavix for up to 5 days prior to her procedure and restart as soon as possible after the surgery." (Dr. Randie Heinzain is keeping patient on ASA and Plavix.)  Medications include albuterol, aspirin 81 mg, Lipitor, Cipro (05/13/16-05/20/16), Plavix (last dose 05/15/16, resume post-op), Advair, Neurontin, HCTZ, Lantus, lisinopril, metformin, Lopressor, Prilosec, Ditropan, Lyrica. (Plavix was initially documented to hold starting 05/15/16 per cardiology instructions, but I spoke with [facility] RN Judeth CornfieldStephanie today and she reported that Dr. Randie Heinzain is leaving patient on both ASA and Plavix. Judeth CornfieldStephanie will confirm with the patient that she is aware to continue both ASA and Plavix perioperatively.)  BP 130/63   Pulse 60   Temp 37.1 C   Resp (!) 60   Ht 5\' 2"  (1.575 m)   Wt 145 lb 11.2 oz (66.1 kg)   SpO2 98%   BMI 26.65 kg/m    EKG 04/16/16: NSR, ST/T wave abnormality, consider anterolateral ischemia.  Cardiac cath 04/15/16 (Dr. Nicki Guadalajarahomas Kelly; done due to anterior ischemia on 04/13/16 stress test): Conclusion:  Ost LAD to Prox LAD lesion, 95 %stenosed.  Prox LAD lesion, 100  %stenosed.  Ost Cx to Prox Cx lesion, 30 %stenosed.  Ost RCA lesion, 100 %stenosed.  LIMA and is normal in caliber.  Mid Cx lesion, 100 %stenosed.  A STENT SYNERGY DES 3.5X12 drug eluting stent was successfully placed.  Dist Graft lesion, 85% stenosed.  Post intervention, there is a 0% residual stenosis.  Ost RCA to Dist RCA lesion, 100 %stenosed.  The left ventricular ejection fraction is 50-55% by visual estimate.  Dist LAD lesion, 40 %stenosed.  Severe native CAD with 95% very proximal stenosis of the LAD before small first diagonal branch with total occlusion of the LAD after this diagonal; patent stent at the ostium of the circumflex with in-stent narrowing of 30% with total occlusion of the OM vessel after a small AV groove circumflex in atrial branch; and total occlusion of the RCA at its origin. - Patent LIMA graft supplying the mid LAD with mild 40% narrowing in the mid distal LAD. - Patent SVG supplying the circumflex marginal vessel - SVG supplying the PDA has a focal  85% distal graft stenosis. - Low normal LV function with an ejection fraction of 50-55% with subtle mild upper mid anterolateral hypocontractility. - Successful PCI to the SVG supplying the PDA with ultimate insertion of a 3.512 mm Synergy stent postdilated to 3.78 mm with the 85% stenosis being reduced to 0%. RECOMMENDATION: The patient received a Synergy stent.  She will require dual antiplatelet therapy with aspirin and Plavix.  Prior to intervention, there was discussion with Dr. Randie Heinzain about deferring her planned vascular  surgery for next week for minimum of a month, but ultimate decision regarding timing will be necessary.  Echo 04/13/16: Study Conclusions - Left ventricle: The cavity size was normal. Wall thickness was   increased in a pattern of mild LVH. Systolic function was normal.   The estimated ejection fraction was in the range of 55% to 60%.   Wall motion was normal; there were no regional wall  motion   abnormalities. Features are consistent with a pseudonormal left   ventricular filling pattern, with concomitant abnormal relaxation   and increased filling pressure (grade 2 diastolic dysfunction). - Left atrium: The atrium was moderately dilated. - Atrial septum: No defect or patent foramen ovale was identified.   Carotid U/S 04/02/16: Impression: 1. Doppler velocities suggest 1-39% right proximal ICA stenosis. 2. Doppler velocities suggest 60-79% (highest in the range) left proximal ICA stenosis.  Preoperative labs noted. A1c 8.9 on 03/30/2016. UA results already called to [facility] (Cipro started).  Patient is only one month out for PCI, but has limb threatening ischemia. Cardiology is on board with surgery plans given the circumstances. Chart previously reviewed with anesthesiologist Dr. Aleene Davidson.  Now that I've learned that Dr. Randie Heinz is keeping patient on DAPT perioperatively I will update him or one of the other anesthesiologists. If no acute changes then I would anticipate that she could proceed as planned.  Velna [facility] Short Stay Center/Anesthesiology Phone [phone] 05/15/2016 3:12 PM

## 2016-05-15 MED FILL — LISINOPRIL 10 MG TABLET: 10 | 30 days supply | Qty: 30 | Fill #5

## 2016-05-15 MED FILL — LANTUS SOLOSTAR 100 UNITS/M: 100 | 30 days supply | Qty: 9 | Fill #4

## 2016-05-15 MED FILL — TRUEPLUS PEN NDL 32GX5/32: 32G X 4 MM | 12 days supply | Qty: 50 | Fill #3

## 2016-05-15 MED FILL — metFORMIN HCL 1000 MG TABS: 1000 | 30 days supply | Qty: 60 | Fill #3

## 2016-05-15 MED FILL — ATORVASTATIN 80 MG TABLET: 80 | 30 days supply | Qty: 30 | Fill #6

## 2016-05-15 MED FILL — HYDROCHLOROTHIAZIDE 25 MG T: 25 | 30 days supply | Qty: 30 | Fill #4

## 2016-05-15 MED FILL — OXYBUTYNIN 5 MG TABLET: 5 | 30 days supply | Qty: 60 | Fill #1

## 2016-05-21 ENCOUNTER — Encounter (HOSPITAL_COMMUNITY)
Admission: RE | Disposition: A | Discharge status: Skilled Nursing Facility | Payer: Self-pay | Source: Ambulatory Visit | Attending: Vascular Surgery

## 2016-05-21 ENCOUNTER — Inpatient Hospital Stay (HOSPITAL_COMMUNITY): Payer: BLUE CROSS/BLUE SHIELD | Admitting: Vascular Surgery

## 2016-05-21 ENCOUNTER — Inpatient Hospital Stay (HOSPITAL_COMMUNITY)
Admission: RE | Admit: 2016-05-21 | Discharge: 2016-05-26 | DRG: 253 | Disposition: A | Discharge status: Skilled Nursing Facility | Payer: BLUE CROSS/BLUE SHIELD | Source: Ambulatory Visit | Attending: Vascular Surgery | Admitting: Vascular Surgery

## 2016-05-21 ENCOUNTER — Encounter (HOSPITAL_COMMUNITY): Payer: Self-pay | Admitting: *Deleted

## 2016-05-21 DIAGNOSIS — I998 Other disorder of circulatory system: Secondary | ICD-10-CM | POA: Diagnosis present

## 2016-05-21 DIAGNOSIS — I10 Essential (primary) hypertension: Secondary | ICD-10-CM | POA: Diagnosis present

## 2016-05-21 DIAGNOSIS — Z7902 Long term (current) use of antithrombotics/antiplatelets: Secondary | ICD-10-CM | POA: Diagnosis not present

## 2016-05-21 DIAGNOSIS — Z9889 Other specified postprocedural states: Secondary | ICD-10-CM | POA: Diagnosis not present

## 2016-05-21 DIAGNOSIS — I739 Peripheral vascular disease, unspecified: Secondary | ICD-10-CM | POA: Diagnosis present

## 2016-05-21 DIAGNOSIS — E1151 Type 2 diabetes mellitus with diabetic peripheral angiopathy without gangrene: Secondary | ICD-10-CM | POA: Diagnosis present

## 2016-05-21 DIAGNOSIS — E7800 Pure hypercholesterolemia, unspecified: Secondary | ICD-10-CM | POA: Diagnosis present

## 2016-05-21 DIAGNOSIS — L089 Local infection of the skin and subcutaneous tissue, unspecified: Secondary | ICD-10-CM | POA: Diagnosis present

## 2016-05-21 DIAGNOSIS — L97811 Non-pressure chronic ulcer of other part of right lower leg limited to breakdown of skin: Secondary | ICD-10-CM | POA: Diagnosis present

## 2016-05-21 DIAGNOSIS — Z955 Presence of coronary angioplasty implant and graft: Secondary | ICD-10-CM | POA: Diagnosis not present

## 2016-05-21 DIAGNOSIS — M797 Fibromyalgia: Secondary | ICD-10-CM | POA: Diagnosis present

## 2016-05-21 DIAGNOSIS — Z951 Presence of aortocoronary bypass graft: Secondary | ICD-10-CM

## 2016-05-21 DIAGNOSIS — R509 Fever, unspecified: Secondary | ICD-10-CM

## 2016-05-21 DIAGNOSIS — Z885 Allergy status to narcotic agent status: Secondary | ICD-10-CM | POA: Diagnosis not present

## 2016-05-21 DIAGNOSIS — Z7982 Long term (current) use of aspirin: Secondary | ICD-10-CM

## 2016-05-21 DIAGNOSIS — Z886 Allergy status to analgesic agent status: Secondary | ICD-10-CM

## 2016-05-21 DIAGNOSIS — Z79899 Other long term (current) drug therapy: Secondary | ICD-10-CM

## 2016-05-21 DIAGNOSIS — W5503XA Scratched by cat, initial encounter: Secondary | ICD-10-CM | POA: Diagnosis not present

## 2016-05-21 DIAGNOSIS — Z794 Long term (current) use of insulin: Secondary | ICD-10-CM

## 2016-05-21 DIAGNOSIS — Z7951 Long term (current) use of inhaled steroids: Secondary | ICD-10-CM | POA: Diagnosis not present

## 2016-05-21 DIAGNOSIS — F17210 Nicotine dependence, cigarettes, uncomplicated: Secondary | ICD-10-CM | POA: Diagnosis present

## 2016-05-21 DIAGNOSIS — K219 Gastro-esophageal reflux disease without esophagitis: Secondary | ICD-10-CM | POA: Diagnosis present

## 2016-05-21 DIAGNOSIS — I2510 Atherosclerotic heart disease of native coronary artery without angina pectoris: Secondary | ICD-10-CM | POA: Diagnosis present

## 2016-05-21 DIAGNOSIS — J45909 Unspecified asthma, uncomplicated: Secondary | ICD-10-CM | POA: Diagnosis present

## 2016-05-21 HISTORY — PX: FEMORAL-POPLITEAL BYPASS GRAFT: SHX937

## 2016-05-21 HISTORY — PX: ENDARTERECTOMY FEMORAL: SHX5804

## 2016-05-21 LAB — URINALYSIS, ROUTINE W REFLEX MICROSCOPIC
Bilirubin Urine: NEGATIVE
Glucose, UA: NEGATIVE mg/dL
HGB URINE DIPSTICK: NEGATIVE
Ketones, ur: NEGATIVE mg/dL
Nitrite: NEGATIVE
PROTEIN: 30 mg/dL — AB
SPECIFIC GRAVITY, URINE: 1.011 (ref 1.005–1.030)
pH: 5 (ref 5.0–8.0)

## 2016-05-21 LAB — GLUCOSE, CAPILLARY
GLUCOSE-CAPILLARY: 139 mg/dL — AB (ref 65–99)
Glucose-Capillary: 157 mg/dL — ABNORMAL HIGH (ref 65–99)
Glucose-Capillary: 91 mg/dL (ref 65–99)
Glucose-Capillary: 125 mg/dL — ABNORMAL HIGH (ref 65–99)

## 2016-05-21 LAB — CBC
HCT: 29.8 % — ABNORMAL LOW (ref 36.0–46.0)
Hemoglobin: 9.9 g/dL — ABNORMAL LOW (ref 12.0–15.0)
MCH: 30.7 pg (ref 26.0–34.0)
MCHC: 33.2 g/dL (ref 30.0–36.0)
MCV: 92.5 fL (ref 78.0–100.0)
PLATELETS: 238 10*3/uL (ref 150–400)
RBC: 3.22 MIL/uL — ABNORMAL LOW (ref 3.87–5.11)
RDW: 13.5 % (ref 11.5–15.5)
WBC: 10.7 10*3/uL — AB (ref 4.0–10.5)

## 2016-05-21 LAB — CREATININE, SERUM
Creatinine, Ser: 0.5 mg/dL (ref 0.44–1.00)
GFR calc non Af Amer: >60 mL/min (ref >60)

## 2016-05-21 LAB — PREPARE RBC (CROSSMATCH)

## 2016-05-21 SURGERY — BYPASS GRAFT FEMORAL-POPLITEAL ARTERY
Anesthesia: General | Site: Leg Upper | Laterality: Right

## 2016-05-21 MED ORDER — SODIUM CHLORIDE 0.9 % IV SOLN: 500.0000 mL | Freq: Once | INTRAVENOUS | Status: DC | PRN

## 2016-05-21 MED ORDER — LABETALOL HCL 5 MG/ML IV SOLN: 10.0000 mg | INTRAVENOUS | Status: DC | PRN

## 2016-05-21 MED ORDER — LIDOCAINE HCL (CARDIAC) 20 MG/ML IV SOLN
INTRAVENOUS | Status: DC | PRN
  Administered 2016-05-21: 60 mg via INTRAVENOUS

## 2016-05-21 MED ORDER — ALUM & MAG HYDROXIDE-SIMETH 200-200-20 MG/5ML PO SUSP: 15.0000 mL | ORAL | Status: DC | PRN

## 2016-05-21 MED ORDER — POTASSIUM CHLORIDE CRYS ER 20 MEQ PO TBCR
20.0000 meq | EXTENDED_RELEASE_TABLET | Freq: Every day | ORAL | Status: AC | PRN
  Administered 2016-05-22: 40 meq via ORAL
  Filled 2016-05-21: qty 2

## 2016-05-21 MED ORDER — ACETAMINOPHEN 325 MG PO TABS
325.0000 mg | ORAL_TABLET | ORAL | Status: DC | PRN
  Administered 2016-05-22 – 2016-05-25 (×3): 650 mg via ORAL
  Filled 2016-05-21 (×3): qty 2

## 2016-05-21 MED ORDER — PANTOPRAZOLE SODIUM 40 MG PO TBEC
40.0000 mg | DELAYED_RELEASE_TABLET | Freq: Every day | ORAL | Status: DC
  Administered 2016-05-21 – 2016-05-26 (×6): 40 mg via ORAL
  Filled 2016-05-21 (×6): qty 1

## 2016-05-21 MED ORDER — CHLORHEXIDINE GLUCONATE CLOTH 2 % EX PADS: 6.0000 | MEDICATED_PAD | Freq: Once | CUTANEOUS | Status: DC

## 2016-05-21 MED ORDER — DOCUSATE SODIUM 100 MG PO CAPS
100.0000 mg | ORAL_CAPSULE | Freq: Every day | ORAL | Status: DC
  Administered 2016-05-22 – 2016-05-26 (×5): 100 mg via ORAL
  Filled 2016-05-21 (×5): qty 1

## 2016-05-21 MED ORDER — PHENYLEPHRINE HCL 10 MG/ML IJ SOLN
INTRAVENOUS | Status: DC | PRN
  Administered 2016-05-21: 55 ug/min via INTRAVENOUS

## 2016-05-21 MED ORDER — METFORMIN HCL 500 MG PO TABS
1000.0000 mg | ORAL_TABLET | Freq: Two times a day (BID) | ORAL | Status: DC
  Administered 2016-05-22 – 2016-05-26 (×9): 1000 mg via ORAL
  Filled 2016-05-21 (×10): qty 2

## 2016-05-21 MED ORDER — ACETAMINOPHEN 650 MG RE SUPP
325.0000 mg | RECTAL | Status: DC | PRN
  Filled 2016-05-21: qty 1

## 2016-05-21 MED ORDER — ONDANSETRON HCL 4 MG/2ML IJ SOLN
4.0000 mg | Freq: Four times a day (QID) | INTRAMUSCULAR | Status: DC | PRN
  Administered 2016-05-21 – 2016-05-25 (×3): 4 mg via INTRAVENOUS
  Filled 2016-05-21 (×4): qty 2

## 2016-05-21 MED ORDER — INSULIN GLARGINE 100 UNIT/ML ~~LOC~~ SOLN
30.0000 [IU] | Freq: Every day | SUBCUTANEOUS | Status: DC
  Administered 2016-05-21 – 2016-05-25 (×5): 30 [IU] via SUBCUTANEOUS
  Filled 2016-05-21 (×6): qty 0.3

## 2016-05-21 MED ORDER — MAGNESIUM SULFATE 2 GM/50ML IV SOLN
2.0000 g | Freq: Every day | INTRAVENOUS | Status: DC | PRN
  Filled 2016-05-21: qty 50

## 2016-05-21 MED ORDER — 0.9 % SODIUM CHLORIDE (POUR BTL) OPTIME
TOPICAL | Status: DC | PRN
  Administered 2016-05-21 (×2): 1000 mL

## 2016-05-21 MED ORDER — HYDROMORPHONE HCL 1 MG/ML IJ SOLN
INTRAMUSCULAR | Status: AC
  Administered 2016-05-21: 0.5 mg via INTRAVENOUS
  Filled 2016-05-21: qty 0.5

## 2016-05-21 MED ORDER — DEXTROSE 5 % IV SOLN
1.5000 g | Freq: Two times a day (BID) | INTRAVENOUS | Status: AC
  Administered 2016-05-21 – 2016-05-22 (×2): 1.5 g via INTRAVENOUS
  Filled 2016-05-21 (×2): qty 1.5

## 2016-05-21 MED ORDER — ASPIRIN EC 81 MG PO TBEC
81.0000 mg | DELAYED_RELEASE_TABLET | Freq: Every day | ORAL | Status: DC
Start: 2016-05-21 — End: 2016-05-26
  Administered 2016-05-21 – 2016-05-26 (×6): 81 mg via ORAL
  Filled 2016-05-21 (×6): qty 1

## 2016-05-21 MED ORDER — PROPOFOL 10 MG/ML IV BOLUS
INTRAVENOUS | Status: AC
  Filled 2016-05-21: qty 20

## 2016-05-21 MED ORDER — ONDANSETRON HCL 4 MG/2ML IJ SOLN
4.0000 mg | Freq: Four times a day (QID) | INTRAMUSCULAR | Status: DC | PRN
Start: 2016-05-21 — End: 2016-05-21

## 2016-05-21 MED ORDER — FENTANYL CITRATE (PF) 100 MCG/2ML IJ SOLN
INTRAMUSCULAR | Status: DC | PRN
  Administered 2016-05-21: 150 ug via INTRAVENOUS

## 2016-05-21 MED ORDER — MORPHINE SULFATE (PF) 4 MG/ML IV SOLN
INTRAVENOUS | Status: AC
  Administered 2016-05-21: 4 mg via INTRAVENOUS
  Filled 2016-05-21: qty 1

## 2016-05-21 MED ORDER — IOPAMIDOL (ISOVUE-300) INJECTION 61%
INTRAVENOUS | Status: AC
  Filled 2016-05-21: qty 50

## 2016-05-21 MED ORDER — OXYCODONE-ACETAMINOPHEN 5-325 MG PO TABS
ORAL_TABLET | ORAL | Status: AC
  Filled 2016-05-21: qty 2

## 2016-05-21 MED ORDER — HYDROCHLOROTHIAZIDE 25 MG PO TABS
25.0000 mg | ORAL_TABLET | Freq: Every day | ORAL | Status: DC
  Administered 2016-05-21 – 2016-05-26 (×6): 25 mg via ORAL
  Filled 2016-05-21 (×6): qty 1

## 2016-05-21 MED ORDER — SUGAMMADEX SODIUM 200 MG/2ML IV SOLN
INTRAVENOUS | Status: DC | PRN
  Administered 2016-05-21: 120 mg via INTRAVENOUS

## 2016-05-21 MED ORDER — ATORVASTATIN CALCIUM 80 MG PO TABS
80.0000 mg | ORAL_TABLET | Freq: Every day | ORAL | Status: DC
  Administered 2016-05-21 – 2016-05-26 (×6): 80 mg via ORAL
  Filled 2016-05-21 (×6): qty 1

## 2016-05-21 MED ORDER — ROCURONIUM BROMIDE 100 MG/10ML IV SOLN
INTRAVENOUS | Status: DC | PRN
  Administered 2016-05-21: 50 mg via INTRAVENOUS

## 2016-05-21 MED ORDER — HEPARIN SODIUM (PORCINE) 5000 UNIT/ML IJ SOLN
5000.0000 [IU] | Freq: Three times a day (TID) | INTRAMUSCULAR | Status: DC
  Administered 2016-05-21 – 2016-05-26 (×15): 5000 [IU] via SUBCUTANEOUS
  Filled 2016-05-21 (×15): qty 1

## 2016-05-21 MED ORDER — ONDANSETRON HCL 4 MG/2ML IJ SOLN
INTRAMUSCULAR | Status: DC | PRN
  Administered 2016-05-21: 4 mg via INTRAVENOUS

## 2016-05-21 MED ORDER — ALBUTEROL SULFATE (2.5 MG/3ML) 0.083% IN NEBU: 2.5000 mg | INHALATION_SOLUTION | Freq: Four times a day (QID) | RESPIRATORY_TRACT | Status: DC | PRN

## 2016-05-21 MED ORDER — HYDROMORPHONE HCL 1 MG/ML IJ SOLN
INTRAMUSCULAR | Status: AC
  Administered 2016-05-21: 0.25 mg via INTRAVENOUS
  Filled 2016-05-21: qty 0.5

## 2016-05-21 MED ORDER — FENTANYL CITRATE (PF) 250 MCG/5ML IJ SOLN
INTRAMUSCULAR | Status: AC
  Filled 2016-05-21: qty 5

## 2016-05-21 MED ORDER — HYDROMORPHONE HCL 1 MG/ML IJ SOLN
INTRAMUSCULAR | Status: AC
Start: 2016-05-21 — End: 2016-05-22
  Filled 2016-05-21: qty 0.5

## 2016-05-21 MED ORDER — CLOPIDOGREL BISULFATE 75 MG PO TABS
75.0000 mg | ORAL_TABLET | Freq: Every day | ORAL | Status: DC
  Administered 2016-05-22 – 2016-05-26 (×5): 75 mg via ORAL
  Filled 2016-05-21 (×5): qty 1

## 2016-05-21 MED ORDER — OXYCODONE-ACETAMINOPHEN 5-325 MG PO TABS
1.0000 | ORAL_TABLET | ORAL | Status: DC | PRN
  Administered 2016-05-21 – 2016-05-26 (×14): 2 via ORAL
  Filled 2016-05-21 (×13): qty 2

## 2016-05-21 MED ORDER — OXYCODONE HCL 5 MG PO TABS
5.0000 mg | ORAL_TABLET | Freq: Once | ORAL | Status: AC | PRN
  Administered 2016-05-21: 5 mg via ORAL

## 2016-05-21 MED ORDER — MORPHINE SULFATE (PF) 4 MG/ML IV SOLN
2.0000 mg | INTRAVENOUS | Status: DC | PRN
  Administered 2016-05-21 – 2016-05-22 (×4): 4 mg via INTRAVENOUS
  Filled 2016-05-21 (×3): qty 1

## 2016-05-21 MED ORDER — METOPROLOL TARTRATE 5 MG/5ML IV SOLN: 2.0000 mg | INTRAVENOUS | Status: DC | PRN

## 2016-05-21 MED ORDER — PROPOFOL 10 MG/ML IV BOLUS
INTRAVENOUS | Status: DC | PRN
  Administered 2016-05-21: 100 mg via INTRAVENOUS

## 2016-05-21 MED ORDER — PHENOL 1.4 % MT LIQD: 1.0000 | OROMUCOSAL | Status: DC | PRN

## 2016-05-21 MED ORDER — HEPARIN SODIUM (PORCINE) 5000 UNIT/ML IJ SOLN
INTRAMUSCULAR | Status: DC | PRN
  Administered 2016-05-21: 500 mL

## 2016-05-21 MED ORDER — HYDRALAZINE HCL 20 MG/ML IJ SOLN: 5.0000 mg | INTRAMUSCULAR | Status: DC | PRN

## 2016-05-21 MED ORDER — GUAIFENESIN-DM 100-10 MG/5ML PO SYRP: 15.0000 mL | ORAL_SOLUTION | ORAL | Status: DC | PRN

## 2016-05-21 MED ORDER — METOPROLOL TARTRATE 12.5 MG HALF TABLET
12.5000 mg | ORAL_TABLET | Freq: Two times a day (BID) | ORAL | Status: DC
  Administered 2016-05-21 – 2016-05-26 (×10): 12.5 mg via ORAL
  Filled 2016-05-21 (×10): qty 1

## 2016-05-21 MED ORDER — PROTAMINE SULFATE 10 MG/ML IV SOLN
INTRAVENOUS | Status: DC | PRN
  Administered 2016-05-21 (×3): 10 mg via INTRAVENOUS

## 2016-05-21 MED ORDER — OXYCODONE HCL 5 MG/5ML PO SOLN: 5.0000 mg | Freq: Once | ORAL | Status: AC | PRN

## 2016-05-21 MED ORDER — OXYBUTYNIN CHLORIDE 5 MG PO TABS
5.0000 mg | ORAL_TABLET | Freq: Two times a day (BID) | ORAL | Status: DC
  Administered 2016-05-21 – 2016-05-26 (×10): 5 mg via ORAL
  Filled 2016-05-21 (×10): qty 1

## 2016-05-21 MED ORDER — SODIUM CHLORIDE 0.9 % IV SOLN
INTRAVENOUS | Status: DC
  Administered 2016-05-21 – 2016-05-22 (×2): via INTRAVENOUS

## 2016-05-21 MED ORDER — HEPARIN SODIUM (PORCINE) 1000 UNIT/ML IJ SOLN
INTRAMUSCULAR | Status: DC | PRN
  Administered 2016-05-21: 7000 [IU] via INTRAVENOUS
  Administered 2016-05-21: 3000 [IU] via INTRAVENOUS

## 2016-05-21 MED ORDER — LISINOPRIL 10 MG PO TABS
10.0000 mg | ORAL_TABLET | Freq: Every day | ORAL | Status: DC
  Administered 2016-05-21 – 2016-05-26 (×6): 10 mg via ORAL
  Filled 2016-05-21 (×6): qty 1

## 2016-05-21 MED ORDER — MIDAZOLAM HCL 5 MG/5ML IJ SOLN
INTRAMUSCULAR | Status: DC | PRN
  Administered 2016-05-21: 0.5 mg via INTRAVENOUS

## 2016-05-21 MED ORDER — INSULIN ASPART 100 UNIT/ML ~~LOC~~ SOLN
0.0000 [IU] | Freq: Three times a day (TID) | SUBCUTANEOUS | Status: DC
  Administered 2016-05-23: 1 [IU] via SUBCUTANEOUS
  Administered 2016-05-23: 3 [IU] via SUBCUTANEOUS
  Administered 2016-05-23 – 2016-05-25 (×4): 2 [IU] via SUBCUTANEOUS
  Administered 2016-05-25: 1 [IU] via SUBCUTANEOUS

## 2016-05-21 MED ORDER — POLYETHYLENE GLYCOL 3350 17 G PO PACK
17.0000 g | PACK | Freq: Every day | ORAL | Status: DC | PRN
  Administered 2016-05-24 – 2016-05-25 (×2): 17 g via ORAL
  Filled 2016-05-21 (×2): qty 1

## 2016-05-21 MED ORDER — CARBAMIDE PEROXIDE 6.5 % OT SOLN: 5.0000 [drp] | Freq: Every day | OTIC | Status: DC | PRN

## 2016-05-21 MED ORDER — ASPIRIN EC 325 MG PO TBEC
DELAYED_RELEASE_TABLET | ORAL | Status: AC
  Filled 2016-05-21: qty 1

## 2016-05-21 MED ORDER — DEXTROSE 5 % IV SOLN
1.5000 g | INTRAVENOUS | Status: AC
  Administered 2016-05-21: 1.5 g via INTRAVENOUS

## 2016-05-21 MED ORDER — BISACODYL 5 MG PO TBEC
5.0000 mg | DELAYED_RELEASE_TABLET | Freq: Every day | ORAL | Status: DC | PRN
  Administered 2016-05-24: 5 mg via ORAL
  Filled 2016-05-21: qty 1

## 2016-05-21 MED ORDER — OXYCODONE HCL 5 MG PO TABS
ORAL_TABLET | ORAL | Status: AC
  Administered 2016-05-21: 5 mg via ORAL
  Filled 2016-05-21: qty 1

## 2016-05-21 MED ORDER — LACTATED RINGERS IV SOLN
INTRAVENOUS | Status: DC | PRN
  Administered 2016-05-21 (×2): via INTRAVENOUS

## 2016-05-21 MED ORDER — PHENYLEPHRINE HCL 10 MG/ML IJ SOLN
INTRAMUSCULAR | Status: DC | PRN
  Administered 2016-05-21 (×2): 80 ug via INTRAVENOUS
  Administered 2016-05-21 (×2): 40 ug via INTRAVENOUS

## 2016-05-21 MED ORDER — GABAPENTIN 300 MG PO CAPS
600.0000 mg | ORAL_CAPSULE | Freq: Three times a day (TID) | ORAL | Status: DC
  Administered 2016-05-21 – 2016-05-26 (×16): 600 mg via ORAL
  Filled 2016-05-21 (×17): qty 2

## 2016-05-21 MED ORDER — MIDAZOLAM HCL 2 MG/2ML IJ SOLN
INTRAMUSCULAR | Status: AC
  Filled 2016-05-21: qty 2

## 2016-05-21 MED ORDER — HYDROMORPHONE HCL 1 MG/ML IJ SOLN
0.2500 mg | INTRAMUSCULAR | Status: DC | PRN
  Administered 2016-05-21: 0.5 mg via INTRAVENOUS
  Administered 2016-05-21: 0.25 mg via INTRAVENOUS
  Administered 2016-05-21: 0.5 mg via INTRAVENOUS
  Administered 2016-05-21: 0.25 mg via INTRAVENOUS

## 2016-05-21 MED ORDER — SODIUM CHLORIDE 0.9 % IV SOLN: INTRAVENOUS | Status: DC

## 2016-05-21 MED ORDER — HEMOSTATIC AGENTS (NO CHARGE) OPTIME
TOPICAL | Status: DC | PRN
  Administered 2016-05-21 (×2): 1 via TOPICAL

## 2016-05-21 SURGICAL SUPPLY — 65 items
ADH SKN CLS APL DERMABOND .7 (GAUZE/BANDAGES/DRESSINGS) ×1
AGENT HMST SPONGE THK3/8 (HEMOSTASIS) ×2
BANDAGE ACE 4X5 VEL STRL LF (GAUZE/BANDAGES/DRESSINGS) IMPLANT
BANDAGE ESMARK 6X9 LF (GAUZE/BANDAGES/DRESSINGS) IMPLANT
BNDG CMPR 9X6 STRL LF SNTH (GAUZE/BANDAGES/DRESSINGS)
BNDG ESMARK 6X9 LF (GAUZE/BANDAGES/DRESSINGS)
CANISTER SUCT 3000ML PPV (MISCELLANEOUS) ×2 IMPLANT
CANNULA VESSEL 3MM 2 BLNT TIP (CANNULA) ×1 IMPLANT
CLIP TI MEDIUM 24 (CLIP) ×2 IMPLANT
CLIP TI WIDE RED SMALL 24 (CLIP) ×2 IMPLANT
CUFF TOURNIQUET SINGLE 18IN (TOURNIQUET CUFF) ×1 IMPLANT
CUFF TOURNIQUET SINGLE 24IN (TOURNIQUET CUFF) IMPLANT
CUFF TOURNIQUET SINGLE 34IN LL (TOURNIQUET CUFF) IMPLANT
CUFF TOURNIQUET SINGLE 44IN (TOURNIQUET CUFF) IMPLANT
DERMABOND ADVANCED (GAUZE/BANDAGES/DRESSINGS) ×1
DERMABOND ADVANCED .7 DNX12 (GAUZE/BANDAGES/DRESSINGS) ×1 IMPLANT
DRAIN CHANNEL 15F RND FF W/TCR (WOUND CARE) IMPLANT
DRAPE HALF SHEET 40X57 (DRAPES) IMPLANT
DRAPE X-RAY CASS 24X20 (DRAPES) IMPLANT
ELECT REM PT RETURN 9FT ADLT (ELECTROSURGICAL) ×2
ELECTRODE REM PT RTRN 9FT ADLT (ELECTROSURGICAL) ×1 IMPLANT
EVACUATOR SILICONE 100CC (DRAIN) IMPLANT
GLOVE BIO SURGEON STRL SZ7.5 (GLOVE) ×2 IMPLANT
GLOVE BIOGEL PI IND STRL 6.5 (GLOVE) IMPLANT
GLOVE BIOGEL PI IND STRL 7.0 (GLOVE) IMPLANT
GLOVE BIOGEL PI IND STRL 7.5 (GLOVE) IMPLANT
GLOVE BIOGEL PI INDICATOR 6.5 (GLOVE) ×3
GLOVE BIOGEL PI INDICATOR 7.0 (GLOVE) ×4
GLOVE BIOGEL PI INDICATOR 7.5 (GLOVE) ×1
GLOVE ECLIPSE 7.0 STRL STRAW (GLOVE) ×3 IMPLANT
GLOVE SURG SS PI 6.5 STRL IVOR (GLOVE) ×2 IMPLANT
GOWN STRL NON-REIN LRG LVL3 (GOWN DISPOSABLE) ×1 IMPLANT
GOWN STRL REUS W/ TWL LRG LVL3 (GOWN DISPOSABLE) ×2 IMPLANT
GOWN STRL REUS W/ TWL XL LVL3 (GOWN DISPOSABLE) ×1 IMPLANT
GOWN STRL REUS W/TWL LRG LVL3 (GOWN DISPOSABLE) ×10
GOWN STRL REUS W/TWL XL LVL3 (GOWN DISPOSABLE) ×2
GRAFT PROPATEN W/RING 6X80X60 (Vascular Products) ×1 IMPLANT
HEMOSTAT SPONGE AVITENE ULTRA (HEMOSTASIS) ×2 IMPLANT
INSERT FOGARTY SM (MISCELLANEOUS) ×1 IMPLANT
KIT BASIN OR (CUSTOM PROCEDURE TRAY) ×2 IMPLANT
KIT ROOM TURNOVER OR (KITS) ×2 IMPLANT
MARKER GRAFT CORONARY BYPASS (MISCELLANEOUS) IMPLANT
NS IRRIG 1000ML POUR BTL (IV SOLUTION) ×4 IMPLANT
PACK PERIPHERAL VASCULAR (CUSTOM PROCEDURE TRAY) ×2 IMPLANT
PAD ARMBOARD 7.5X6 YLW CONV (MISCELLANEOUS) ×4 IMPLANT
SET COLLECT BLD 21X3/4 12 (NEEDLE) IMPLANT
STOPCOCK 4 WAY LG BORE MALE ST (IV SETS) IMPLANT
SUT ETHILON 3 0 PS 1 (SUTURE) IMPLANT
SUT GORETEX 6.0 TT13 (SUTURE) IMPLANT
SUT GORETEX 6.0 TT9 (SUTURE) IMPLANT
SUT MNCRL AB 4-0 PS2 18 (SUTURE) ×4 IMPLANT
SUT PROLENE 5 0 C 1 24 (SUTURE) ×2 IMPLANT
SUT PROLENE 6 0 BV (SUTURE) ×4 IMPLANT
SUT PROLENE 7 0 BV 1 (SUTURE) IMPLANT
SUT SILK 2 0 SH (SUTURE) ×2 IMPLANT
SUT SILK 3 0 (SUTURE)
SUT SILK 3-0 18XBRD TIE 12 (SUTURE) IMPLANT
SUT VIC AB 2-0 CT1 27 (SUTURE) ×4
SUT VIC AB 2-0 CT1 TAPERPNT 27 (SUTURE) ×2 IMPLANT
SUT VIC AB 3-0 SH 27 (SUTURE) ×4
SUT VIC AB 3-0 SH 27X BRD (SUTURE) ×2 IMPLANT
TRAY FOLEY W/METER SILVER 16FR (SET/KITS/TRAYS/PACK) ×2 IMPLANT
TUBING EXTENTION W/L.L. (IV SETS) IMPLANT
UNDERPAD 30X30 (UNDERPADS AND DIAPERS) ×2 IMPLANT
WATER STERILE IRR 1000ML POUR (IV SOLUTION) ×2 IMPLANT

## 2016-05-21 NOTE — H&P (Signed)
H+P    HPI:  Andrea [name] is a 66 y.o. female follows up from recent angiogram which demonstrated bilateral superficial femoral artery occlusions. She does have wound on the right foot as well as wounds on her left leg. She has a significant cardiac history having had bypass in the past. She states that she did quit smoking 30 days ago. She is taking aspirin and statin. She has never had stroke TIA or amaurosis. She presents today for bypass surgery  Past Medical History:  Diagnosis Date  . Asthma   . Coronary artery disease   . Diabetes mellitus without complication ([place])   . Dyspnea   . Fibromyalgia   . GERD (gastroesophageal reflux disease)   . High cholesterol   . Hypertension   . PONV (postoperative nausea and vomiting)   . PVD (peripheral vascular disease) ([place])   . Urinary frequency   . Urinary incontinence     Past Surgical History:  Procedure Laterality Date  . ABDOMINAL AORTOGRAM W/LOWER EXTREMITY N/A 03/25/2016   Procedure: Abdominal Aortogram w/Lower Extremity;  Surgeon: Waynetta Sandy, MD;  Location: [facility];  Service: Cardiovascular;  Laterality: N/A;  . CORONARY ARTERY BYPASS GRAFT    . CORONARY STENT INTERVENTION N/A 04/15/2016   Procedure: Coronary Stent Intervention;  Surgeon: Troy Sine, MD;  Location: [facility];  Service: Cardiovascular;  Laterality: N/A;  . KNEE SURGERY    . LEFT HEART CATH AND CORS/GRAFTS ANGIOGRAPHY N/A 04/15/2016   Procedure: Left Heart Cath and Cors/Grafts Angiography;  Surgeon: Troy Sine, MD;  Location: [facility];  Service: Cardiovascular;  Laterality: N/A;  . MULTIPLE TOOTH EXTRACTIONS      Allergies  Allergen Reactions  . Naproxen Itching  . Tramadol Hcl Itching    Prior to Admission medications   Medication Sig Start Date End Date Taking? Authorizing Provider  albuterol (PROVENTIL HFA;VENTOLIN HFA) 108 (90 Base) MCG/ACT inhaler Inhale 2 puffs into the lungs every 6 (six) hours as  needed for wheezing or shortness of breath. 12/26/15  Yes Arnoldo Morale, MD  aspirin EC 81 MG tablet Take 1 tablet (81 mg total) by mouth daily. 10/16/15  Yes Langeland, Dawn T, MD  atorvastatin (LIPITOR) 80 MG tablet Take 1 tablet (80 mg total) by mouth daily. 10/17/15  Yes Langeland, Dawn T, MD  carbamide peroxide (DEBROX) 6.5 % otic solution Place 5 drops into both ears daily as needed (WAX BUILDUP).    Yes [provider]  Fluticasone-Salmeterol (ADVAIR) 100-50 MCG/DOSE AEPB Inhale 1 puff into the lungs 2 (two) times daily. 12/26/15  Yes Arnoldo Morale, MD  gabapentin (NEURONTIN) 300 MG capsule Take 600 mg by mouth 3 (three) times daily.  03/30/16  Yes [provider]  hydrochlorothiazide (HYDRODIURIL) 25 MG tablet Take 1 tablet (25 mg total) by mouth daily. 01/14/16  Yes Arnoldo Morale, MD  Insulin Glargine (LANTUS SOLOSTAR) 100 UNIT/ML Solostar Pen Inject 30 Units into the skin daily at 10 pm. 03/30/16  Yes Arnoldo Morale, MD  lisinopril (PRINIVIL,ZESTRIL) 10 MG tablet Take 1 tablet (10 mg total) by mouth daily. 12/17/15  Yes Langeland, Dawn T, MD  metFORMIN (GLUCOPHAGE) 1000 MG tablet Take 1 tablet (1,000 mg total) by mouth 2 (two) times daily. Restart 48 hrs after discharge. 04/16/16  Yes Duke, Tami Lin, PA  metoprolol tartrate (LOPRESSOR) 25 MG tablet Take 0.5 tablets (12.5 mg total) by mouth 2 (two) times daily. 04/16/16  Yes Duke, Tami Lin, PA  oxybutynin [facility])  5 MG tablet TAKE 1 TABLET BY MOUTH 2 TIMES DAILY. 04/14/16  Yes Arnoldo Morale, MD  Blood Glucose Monitoring Suppl (TRUE METRIX GO GLUCOSE METER) w/Device KIT 1 each by Does not apply route every 8 (eight) hours as needed. Patient taking differently: 1 each by Does not apply route daily.  10/16/15   Maren Reamer, MD  clopidogrel (PLAVIX) 75 MG tablet Take 1 tablet (75 mg total) by mouth daily with breakfast. 04/17/16   Duke, Tami Lin, PA  glucose blood (TRUE METRIX BLOOD GLUCOSE TEST) test strip Use as  instructed Patient taking differently: 1 each by Other route daily. Use as instructed 10/16/15   Lottie Mussel T, MD  Insulin Pen Needle (ULTICARE MICRO PEN NEEDLES) 32G X 4 MM MISC 1 applicator by Does not apply route at bedtime. Patient taking differently: 1 applicator by Does not apply route daily.  12/17/15   Langeland, Leda Quail, MD  omeprazole (PRILOSEC) 20 MG capsule TAKE 1 CAPSULE BY MOUTH DAILY. 04/14/16   Arnoldo Morale, MD  pregabalin (LYRICA) 100 MG capsule Take 1 capsule (100 mg total) by mouth 2 (two) times daily. 03/30/16   Arnoldo Morale, MD  TRUEPLUS LANCETS 26G MISC 1 each by Does not apply route every 8 (eight) hours as needed. Patient taking differently: 1 each by Other route daily.  10/16/15   Maren Reamer, MD    Social History   Social History  . Marital status: Widowed    Spouse name: N/A  . Number of children: N/A  . Years of education: N/A   Occupational History  . Not on file.   Social History Main Topics  . Smoking status: Former Smoker    Packs/day: 0.25    Types: Cigarettes    Quit date: 04/04/2016  . Smokeless tobacco: Never Used  . Alcohol use No  . Drug use: No  . Sexual activity: Not on file   Other Topics Concern  . Not on file   Social History Narrative  . No narrative on file     Family History  Problem Relation Age of Onset  . Stroke Mother      Physical Examination  Vitals:   05/21/16 0557 05/21/16 0558  BP: (!) 82/51 (!) 107/44  Pulse: 74   Resp: 20   Temp: 99.4 F (37.4 C)    There is no height or weight on file to calculate BMI.  Constitutional: She is oriented to person, place, and time. She appears well-developed.  HENT:  Head: Normocephalic.  Eyes: Pupils are equal, round, and reactive to light.  Neck: Normal range of motion.  Cardiovascular:  Pulses: Carotid pulses are 2+on the right side, and 2+on the left side. Femoral pulses are 2+on the right side, and 2+on the left side. Abdominal: Soft. She  exhibits no mass.  Musculoskeletal: Normal range of motion.  Neurological: She is alertand oriented to person, place, and time.  Skin:  1cm ulcer with dry eschar on dorsum right foot, ulceration of left leg Psychiatric: She has a normal mood and affect. Her behavior is normal. Judgmentand thought contentnormal.    CBC    Component Value Date/Time   WBC 11.9 (H) 05/13/2016 1227   RBC 4.05 05/13/2016 1227   HGB 12.2 05/13/2016 1227   HCT 37.9 05/13/2016 1227   HCT 37.0 04/14/2016 1431   PLT 241 05/13/2016 1227   PLT 326 04/14/2016 1431   MCV 93.6 05/13/2016 1227   MCV 88 04/14/2016 1431   MCH 30.1  05/13/2016 1227   MCHC 32.2 05/13/2016 1227   RDW 13.3 05/13/2016 1227   RDW 13.1 04/14/2016 1431    BMET    Component Value Date/Time   NA 140 05/13/2016 1227   NA 140 04/14/2016 1431   K 4.2 05/13/2016 1227   CL 104 05/13/2016 1227   CO2 26 05/13/2016 1227   GLUCOSE 230 (H) 05/13/2016 1227   BUN 15 05/13/2016 1227   BUN 19 04/14/2016 1431   CREATININE 0.72 05/13/2016 1227   CALCIUM 8.9 05/13/2016 1227   GFRNONAA >60 05/13/2016 1227   GFRAA >60 05/13/2016 1227    COAGS: Lab Results  Component Value Date   INR 0.93 05/13/2016   INR 0.9 04/14/2016     Non-Invasive Vascular Imaging:      ASSESSMENT/PLAN: This is a 66 y.o. female with critical limb ischemia on the right with wounds. Plan for right fem-bk pop bypass with graft. Will give full dose aspirin now prior to surgery. Discussed risks and continued risk of limb loss. Will need smoking cessation post-operatively.   Alba Perillo C. Donzetta Matters, MD [facility] Office: [phone] Pager: [phone]

## 2016-05-21 NOTE — Anesthesia Procedure Notes (Signed)
Procedure Name: Intubation Date/Time: 05/21/2016 7:38 AM Performed by: Neldon Newport Pre-anesthesia Checklist: Timeout performed, Patient being monitored, Suction available, Emergency Drugs available and Patient identified Patient Re-evaluated:Patient Re-evaluated prior to inductionOxygen Delivery Method: Circle system utilized Preoxygenation: Pre-oxygenation with 100% oxygen Intubation Type: IV induction Ventilation: Mask ventilation without difficulty and Oral airway inserted - appropriate to patient size Laryngoscope Size: Mac and 3 Grade View: Grade I Tube type: Oral Tube size: 7.0 mm Number of attempts: 1 Placement Confirmation: breath sounds checked- equal and bilateral,  positive ETCO2 and ETT inserted through vocal cords under direct vision Secured at: 22 cm Tube secured with: Tape Dental Injury: Teeth and Oropharynx as per pre-operative assessment

## 2016-05-21 NOTE — Progress Notes (Signed)
Patient did not take metoprolol this morning.  She takes 12.5 twice a day.  Will hold off giving morning dose per Dr. Desmond Lopeurk

## 2016-05-21 NOTE — Op Note (Signed)
Patient name: Andrea [name] MRN: [id number] DOB: Nov 28, 1950 Sex: female  05/21/2016 Pre-operative Diagnosis: critical right lower extremity ischemia Post-operative diagnosis:  Same Surgeon:  Apolinar JunesBrandon C. Randie Heinzain, MD Assistant: Lianne CureMaureen Collins, PA Procedure Performed: 1.  Right common femoral endarterectomy 2.  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 She is clear from a coronary standpoint and we are proceeding with right femoral to below-knee popliteal artery bypass for wounds. We have discussed the risk and benefits and she is willing to proceed.  Findings: Common femoral artery was severely diseased below-knee popliteal artery was free of disease. Following grafting there were signals at the posterior tibial and peroneal arteries that augmented with compression of the graft.   Procedure:  The patient was identified in the holding area and taken to the operating room where she was placed supine on the operating table general anesthesia was induced she was given antibiotics sterilely prepped and draped in the right lower extremity in the usual fashion timeout called. Following this identified the common femoral artery by palpation made a longitudinal incision overlying that. We dissected down through skin and subcutaneous tissues tissue until we identified a starclose device overlying the artery and this was excised. We then exposed our external iliac artery placed a vessel loop around this. We dissected down onto the profunda and SFA placed a vessel loop around the profunda femoris arteries. The SFA did not have any pulsatility in. Then turned our attention to the right below-knee popliteal artery  where longitudinal incision was made 1 cm posterior to the tibia. We dissected down and divided the fascia down the presence rhinitis identified our popliteal neurovascular bundle. The medial vein was encircled with vessel loop and this was retracted laterally. The artery was exposed not encircled with any vessel loops. Then created a tunnel posterior to the knee and subsartorial at the groin. A 6 mm propatent graft was then tunneled and the patient was given 7000 units of heparin. Attention was then returned to the groin. After heparin had circulated for 3 minutes clamped the external iliac artery followed by profunda femoris arteries and opened longitudinally where there was significant plaque. I performed endarterectomy and also removed calcifications from the superficial femoral artery in case the patient should ever need endovascular intervention. This was all heparinized flush we had good antegrade flow and backbleeding from the profunda. The graft was trimmed to size and sewn end to side with 5-0 Prolene suture. We then flushed through the graft clamped the graft opened our external iliac and profunda femoris arteries and had pulsatility through our graft. Attention was again turned to the below-knee exposure. The patient was given another 3000 units of heparin. The leg was straightened and graft trimmed to size. The leg was then exsanguinated and tourniquet inflated above the knee 250 mmHg. This would stay inflated for 20 minutes total. We then reexposed the popliteal artery opened longitudinally trimmed the graft to size and sewed end to side with 6-0 Prolene suture. Prior to completing anastomosis we allowed our tourniquet down flushed through our graft. We then completed the anastomosis had signals at the posterior tibial peroneal artery that augmented with compression of the graft. Satisfied with this patient  was given 50 mg of protamine hemostasis obtained in the wounds and they were closed with 2-0  Vicryl 3-0 Vicryl for Monocryl suture and Dermabond placed above that. Patient will be awakened from anesthesia and transferred to PACU. All counts were correct at completion.    Blood loss 300 mL.    Elvia Aydin C. Randie Heinz, MD [facility] Office: [phone] Pager: [phone]

## 2016-05-21 NOTE — Anesthesia Postprocedure Evaluation (Signed)
Anesthesia Post Note  Patient: Andrea [name]  Procedure(s) Performed: Procedure(s) (LRB): RIGHT FEMORAL-BELOW KNEE POPLITEAL ARTERY BYPASS GRAFT USING PROPATEN GORETEX GRAFT (Right) RIGHT FEMORAL ENDARTERECTOMY (Right)  Patient location during evaluation: PACU Anesthesia Type: General Level of consciousness: awake and alert and patient cooperative Pain management: pain level controlled Vital Signs Assessment: post-procedure vital signs reviewed and stable Respiratory status: spontaneous breathing and respiratory function stable Cardiovascular status: stable Anesthetic complications: no       Last Vitals:  Vitals:   05/21/16 1430 05/21/16 1445  BP:    Pulse: 91 75  Resp: (!) 21 16  Temp:      Last Pain:  Vitals:   05/21/16 1430  TempSrc:   PainSc: 9                  Ammar Moffatt S

## 2016-05-21 NOTE — Care Management Note (Addendum)
Case Management Note  Patient Details  Name: Andrea [name] MRN: [id number] Date of Birth: 05-Jun-1950  Subjective/Objective:      s/p  Right common femoral endarterectomy, Right common femoral to below knee popliteal artery bypass. Await pt eval.   5/11 1228 Letha Capeeborah Armondo Cech RN, BSN- per pt eval rec SNF, CSW referral.              Action/Plan: NCM will follow for dc needs.  Expected Discharge Date:                  Expected Discharge Plan:     In-House Referral:     Discharge planning Services  CM Consult  Post Acute Care Choice:    Choice offered to:     DME Arranged:    DME Agency:     HH Arranged:    HH Agency:     Status of Service:  In process, will continue to follow  If discussed at Long Length of Stay Meetings, dates discussed:    Additional Comments:  Leone Havenaylor, Rhaya Coale [place], RN 05/21/2016, 4:31 PM

## 2016-05-21 NOTE — Transfer of Care (Signed)
Immediate Anesthesia Transfer of Care Note  Patient: Andrea [name]  Procedure(s) Performed: Procedure(s): RIGHT FEMORAL-BELOW KNEE POPLITEAL ARTERY BYPASS GRAFT USING PROPATEN GORETEX GRAFT (Right) RIGHT FEMORAL ENDARTERECTOMY (Right)  Patient Location: PACU  Anesthesia Type:General  Level of Consciousness: awake, alert  and oriented  Airway & Oxygen Therapy: Patient Spontanous Breathing and Patient connected to nasal cannula oxygen  Post-op Assessment: Report given to RN, Post -op Vital signs reviewed and stable and Patient moving all extremities X 4  Post vital signs: Reviewed and stable  Last Vitals:  Vitals:   05/21/16 0557 05/21/16 0558  BP: (!) 82/51 (!) 107/44  Pulse: 74   Resp: 20   Temp: 37.4 C     Last Pain:  Vitals:   05/21/16 0557  TempSrc: Oral  PainSc:       Patients Stated Pain Goal: 3 (05/21/16 0555)  Complications: No apparent anesthesia complications

## 2016-05-21 NOTE — Anesthesia Preprocedure Evaluation (Addendum)
Anesthesia Evaluation  Patient identified by MRN, date of birth, ID band Patient awake    Reviewed: Allergy & Precautions, H&P , NPO status , Patient's Chart, lab work & pertinent test results  History of Anesthesia Complications (+) PONV and history of anesthetic complications  Airway Mallampati: II   Neck ROM: full    Dental  (+) Edentulous Upper, Edentulous Lower, Dental Advidsory Given   Pulmonary shortness of breath, asthma , former smoker,    breath sounds clear to auscultation       Cardiovascular hypertension, + angina + CAD, + Cardiac Stents, + CABG and + Peripheral Vascular Disease   Rhythm:regular Rate:Normal  Cardiology cleared for surgery.  Pt is at elevated risk due to recent DES placement 1 month ago.   Neuro/Psych  Neuromuscular disease    GI/Hepatic GERD  Controlled and Medicated,  Endo/Other  diabetes, Type 1, Insulin Dependent  Renal/GU      Musculoskeletal  (+) Fibromyalgia -  Abdominal   Peds  Hematology   Anesthesia Other Findings - Left ventricle: The cavity size was normal. Wall thickness was   increased in a pattern of mild LVH. Systolic function was normal.   The estimated ejection fraction was in the range of 55% to 60%.   Wall motion was normal; there were no regional wall motion   abnormalities. Features are consistent with a pseudonormal left   ventricular filling pattern, with concomitant abnormal relaxation   and increased filling pressure (grade 2 diastolic dysfunction). - Left atrium: The atrium was moderately dilated. - Atrial septum: No defect or patent foramen ovale was identified  Reproductive/Obstetrics                           Anesthesia Physical Anesthesia Plan  ASA: IV  Anesthesia Plan: General   Post-op Pain Management:    Induction: Intravenous  Airway Management Planned: Oral ETT  Additional Equipment: Arterial line  Intra-op Plan:    Post-operative Plan: Extubation in OR  Informed Consent: I have reviewed the patients History and Physical, chart, labs and discussed the procedure including the risks, benefits and alternatives for the proposed anesthesia with the patient or authorized representative who has indicated his/her understanding and acceptance.   Dental Advisory Given  Plan Discussed with: Surgeon, CRNA and Anesthesiologist  Anesthesia Plan Comments:        Anesthesia Quick Evaluation

## 2016-05-22 ENCOUNTER — Encounter (HOSPITAL_COMMUNITY): Payer: Self-pay | Admitting: Vascular Surgery

## 2016-05-22 ENCOUNTER — Inpatient Hospital Stay (HOSPITAL_COMMUNITY): Payer: BLUE CROSS/BLUE SHIELD

## 2016-05-22 DIAGNOSIS — Z9889 Other specified postprocedural states: Secondary | ICD-10-CM

## 2016-05-22 LAB — URINALYSIS, ROUTINE W REFLEX MICROSCOPIC
Bilirubin Urine: NEGATIVE
Glucose, UA: NEGATIVE mg/dL
Ketones, ur: NEGATIVE mg/dL
Nitrite: NEGATIVE
PH: 5 (ref 5.0–8.0)
Protein, ur: NEGATIVE mg/dL
SPECIFIC GRAVITY, URINE: 1.01 (ref 1.005–1.030)

## 2016-05-22 LAB — BASIC METABOLIC PANEL
ANION GAP: 6 (ref 5–15)
BUN: 9 mg/dL (ref 6–20)
CO2: 25 mmol/L (ref 22–32)
Calcium: 7.8 mg/dL — ABNORMAL LOW (ref 8.9–10.3)
Chloride: 105 mmol/L (ref 101–111)
Creatinine, Ser: 0.49 mg/dL (ref 0.44–1.00)
GLUCOSE: 80 mg/dL (ref 65–99)
Potassium: 3.3 mmol/L — ABNORMAL LOW (ref 3.5–5.1)
Sodium: 136 mmol/L (ref 135–145)

## 2016-05-22 LAB — BLOOD GAS, ARTERIAL
ACID-BASE EXCESS: 0.4 mmol/L (ref 0.0–2.0)
Bicarbonate: 23.8 mmol/L (ref 20.0–28.0)
Drawn by: 330991
O2 CONTENT: 2 L/min
O2 SAT: 90.9 %
PCO2 ART: 36.9 mmHg (ref 32.0–48.0)
PO2 ART: 64.2 mmHg — AB (ref 83.0–108.0)
Patient temperature: 101.7
pH, Arterial: 7.434 (ref 7.350–7.450)

## 2016-05-22 LAB — CBC
HCT: 29.5 % — ABNORMAL LOW (ref 36.0–46.0)
Hemoglobin: 9.3 g/dL — ABNORMAL LOW (ref 12.0–15.0)
MCH: 29.3 pg (ref 26.0–34.0)
MCHC: 31.5 g/dL (ref 30.0–36.0)
MCV: 93.1 fL (ref 78.0–100.0)
PLATELETS: 265 10*3/uL (ref 150–400)
RBC: 3.17 MIL/uL — AB (ref 3.87–5.11)
RDW: 13.2 % (ref 11.5–15.5)
WBC: 9.9 10*3/uL (ref 4.0–10.5)

## 2016-05-22 LAB — HEMOGLOBIN A1C
HEMOGLOBIN A1C: 8.5 % — AB (ref 4.8–5.6)
MEAN PLASMA GLUCOSE: 197 mg/dL

## 2016-05-22 LAB — LACTIC ACID, PLASMA: Lactic Acid, Venous: 0.8 mmol/L (ref 0.5–1.9)

## 2016-05-22 LAB — GLUCOSE, CAPILLARY
GLUCOSE-CAPILLARY: 121 mg/dL — AB (ref 65–99)
Glucose-Capillary: 86 mg/dL (ref 65–99)
Glucose-Capillary: 127 mg/dL — ABNORMAL HIGH (ref 65–99)

## 2016-05-22 MED ORDER — VANCOMYCIN HCL 500 MG IV SOLR
500.0000 mg | Freq: Two times a day (BID) | INTRAVENOUS | Status: DC
  Administered 2016-05-22 – 2016-05-24 (×5): 500 mg via INTRAVENOUS
  Filled 2016-05-22 (×6): qty 500

## 2016-05-22 MED ORDER — ACETAMINOPHEN 650 MG RE SUPP
650.0000 mg | Freq: Once | RECTAL | Status: AC
  Administered 2016-05-22: 650 mg via RECTAL

## 2016-05-22 MED ORDER — DEXTROSE 5 % IV SOLN
1.5000 g | Freq: Two times a day (BID) | INTRAVENOUS | Status: AC
  Administered 2016-05-22 – 2016-05-23 (×2): 1.5 g via INTRAVENOUS
  Filled 2016-05-22 (×2): qty 1.5

## 2016-05-22 MED ORDER — PIPERACILLIN-TAZOBACTAM 3.375 G IVPB
3.3750 g | Freq: Three times a day (TID) | INTRAVENOUS | Status: DC
  Administered 2016-05-23 – 2016-05-25 (×8): 3.375 g via INTRAVENOUS
  Filled 2016-05-22 (×9): qty 50

## 2016-05-22 MED ORDER — MORPHINE SULFATE (PF) 4 MG/ML IV SOLN
2.0000 mg | INTRAVENOUS | Status: DC | PRN
  Administered 2016-05-22 – 2016-05-24 (×2): 2 mg via INTRAVENOUS
  Filled 2016-05-22 (×2): qty 1

## 2016-05-22 NOTE — Progress Notes (Signed)
OT Cancellation Note  Patient Details Name: Elizebeth Brookingoni B Frizell MRN: [id number] DOB: 1951-01-07   Cancelled Treatment:    Reason Eval/Treat Not Completed: Patient at procedure or test/ unavailable. Will follow up as time allows.  Gaye AlkenBailey A Larina Lieurance M.S., OTR/L Pager: [phone]  05/22/2016, 10:55 AM

## 2016-05-22 NOTE — Progress Notes (Signed)
Called to patient's room by nurse tech who reports patient is confused and has temperature of 102.F. Patient is alert to place and self, is lethargic and complaining of pain. Surgical sites are slightly red and hot to touch. Patient's blood sugar is wnl and her oxygen is 94 on room air. Patient given morphine for pain and acetaminophen for fever. Notified oncall. Dr. Randie Heinzain will see patient after he finishes in the OR. Will continue to monitor.

## 2016-05-22 NOTE — Progress Notes (Signed)
VASCULAR LAB PRELIMINARY  ARTERIAL  ABI completed: Right ABI of 0.83 is suggestive of mild arterial occlusive disease at rest. Left ABI of 0.18 is suggestive of critical arterial occlusive disease at rest. Previous ABI's obtained on 03/16/16 demonstrated values of 0.35 on the right and 0.29 on the left.   RIGHT    LEFT    PRESSURE WAVEFORM  PRESSURE WAVEFORM  BRACHIAL 147 Triphasic BRACHIAL 133 Triphasic  DP 95 Biphasic DP 26 Monophasic  PT 122 Biphasic PT      RIGHT LEFT  ABI 0.83 0.18   Results were given to the patient's nurse, Morghan.  Andrea [name], RVT 05/22/2016, 11:27 AM

## 2016-05-22 NOTE — Significant Event (Addendum)
Rapid Response Event Note  Overview: Time Called: 2045 Arrival Time: 2045 Event Type: Other (Comment) (AMS)  Initial Focused Assessment: While on unit rounds, asked by Darryl, RN to see pt with AMS and fever.  Pt had recent R fem pop, RN states surgical site hot to touch.  On arrival, pt is lethargic, opens eyes to voice and follows simple commands. Pupils are 2 round and reactive bilat.  Pt is oriented to person and place only.  No focal neuro deficits noted, strength is equal in all 4 extremities.   Pt's is tachypneic with RR of 36, O2 sats on RA initially 89 with probe on finger and then 94 with probe on R ear.   Mild rhonchi to R upper lobe, clear on the Left.   Heart tones are normal, ST on monitor with rate of 110.   Abdomen soft and with audible BS.   CBG 127. BP 124/63 (76) HR 110 Temp 101.7 98% on 2L.   Interventions: 2L [place], ABG, Lactic acid  Plan of Care (if not transferred): Advised to increase frequency of VS checks and to monitor for changes to mental status closely.  Lactic normal.  ABG 7.4, 36.9, 64.2, 23.8, Pt's primary MD aware and planning to come by room per RN.    Event Summary: Name of Physician Notified: Dr. Randie Heinzain, B at 2055    at    Outcome: Stayed in room and stabalized  Event End Time: 2120  Dan MakerDUNLOP, Elvis Boot

## 2016-05-22 NOTE — Progress Notes (Signed)
OT Cancellation Note  Patient Details Name: Andrea [name] MRN: [id number] DOB: 10/29/1950   Cancelled Treatment:    Reason Eval/Treat Not Completed: Other (comment) (pt finishing lunch then transfer to different unit). 2nd attempt at OT eval today, will follow up as time allows.  Gaye AlkenBailey A Nicandro Perrault M.S., OTR/L Pager: [phone]  05/22/2016, 2:01 PM

## 2016-05-22 NOTE — Progress Notes (Signed)
Pt temp 102.2 prn tylenol 650 PO given, Temp recheck 101.9. MD paged. MD Imogene Burnhen ordered 650 mg tylenol suppository once, CXR, blood cultures, UA and Urine cultures. Will continue to monitor and pass information to day shift RN.

## 2016-05-22 NOTE — Progress Notes (Signed)
Pharmacy Antibiotic Note  Andrea Brookingoni B Andrea [name] is a 66 y.o. female admitted on 05/21/2016 with RLE ischemia.  Pharmacy has been consulted for Vancomycin/Zosyn dosing for fever up to 102.2. WBC WNL. Renal function ok. S/P OR today.   Plan: Vancomycin 500 mg IV q12h Zosyn 3.375G IV q8h to be infused over 4 hours Trend WBC, temp, renal function  F/U infectious work-up Drug levels as indicated   Height: 5\' 2"  (157.5 cm) Weight: 143 lb 15.4 oz (65.3 kg) IBW/kg (Calculated) : 50.1  Temp (24hrs), Avg:100.6 F (38.1 C), Min:98.8 F (37.1 C), Max:102.2 F (39 C)   Recent Labs Lab 05/21/16 1810 05/22/16 0449 05/22/16 2138  WBC 10.7* 9.9  --   CREATININE 0.50 0.49  --   LATICACIDVEN  --   --  0.8    Estimated Creatinine Clearance: 63.9 mL/min (by C-G formula based on SCr of 0.49 mg/dL).    Allergies  Allergen Reactions  . Naproxen Itching  . Tramadol Hcl Itching    Andrea [name], Andrea [name] 05/22/2016 10:51 PM

## 2016-05-22 NOTE — Evaluation (Signed)
Physical Therapy Evaluation Patient Details Name: Andrea [name] B Class MRN: [id number] DOB: [birth year] Today's Date: 05/22/2016   History of Present Illness   66 year old female history of coronary artery disease status post recent stenting of her bypass graft also has critical limb ischemia on the right with wounds as well as on the left and has high-grade stenosis of her left carotid artery, S/P    , S/P right femoral-popliteal Bypass grafting on 05/21/16.  Clinical Impression  The patient tolerated  Standing and pivoting to recliner with moderate assistance. Decreased tolerance/support of the right leg .  Did not complain of pain.    Follow Up Recommendations SNF;Supervision/Assistance - 24 hour    Equipment Recommendations  None recommended by PT    Recommendations for Other Services       Precautions / Restrictions Precautions Precautions: Fall Restrictions Weight Bearing Restrictions: No      Mobility  Bed Mobility Overal bed mobility: Needs Assistance Bed Mobility: Supine to Sit     Supine to sit: HOB elevated;Mod assist     General bed mobility comments: assist with trunk to upright, use of bed pad to scoot  to bed edge  Transfers Overall transfer level: Needs assistance Equipment used: Rolling walker (2 wheeled) Transfers: Sit to/from [employer] CorporationStand;Stand Pivot Transfers Sit to Stand: Mod assist Stand pivot transfers: Mod assist       General transfer comment: decreased weight bearing on the rigtht leg. Scoot pivot on left leg mostly with RW to turn enough to sit down to recliner.  Ambulation/Gait                Stairs            Wheelchair Mobility    Modified Rankin (Stroke Patients Only)       Balance Overall balance assessment: Needs assistance Sitting-balance support: Feet supported;Bilateral upper extremity supported Sitting balance-Leahy Scale: Fair                                       Pertinent Vitals/Pain Pain  Assessment: No/denies pain    Home Living Family/patient expects to be discharged to:: Private residence Living Arrangements: Alone Available Help at Discharge: Family;Available PRN/intermittently Type of Home: Apartment Home Access: Level entry     Home Layout: Two level;Able to live on main level with bedroom/bathroom Home Equipment: Gilmer MorCane - single point;Walker - 2 wheels      Prior Function Level of Independence: Independent with assistive device(s)         Comments: Uses SPC vs furniture walker for household distances and RW for community. Drives. Has bedroom and shower upstairs and sometimes sleeps downstairs on couch/recliner depending on how she feels. Children assist as needed. Pt reports a crowded apt.     Hand Dominance        Extremity/Trunk Assessment   Upper Extremity Assessment Upper Extremity Assessment: Defer to OT evaluation    Lower Extremity Assessment Lower Extremity Assessment: RLE deficits/detail RLE Deficits / Details: norted decreasaed knee extension, tends to ER and flex the leg. Decreased weight  bearing in standing . Able to dorsiflex and plantar flex the  Ankle through 50% of ROM. Decreased LT of the  Toes.  Reports decreased sensation of the left foot,    Cervical / Trunk Assessment Cervical / Trunk Assessment: Kyphotic  Communication   Communication: HOH  Cognition Arousal/Alertness: Awake/alert Behavior During Therapy: WFL for tasks assessed/performed  Overall Cognitive Status: Within Functional Limits for tasks assessed                                        General Comments      Exercises     Assessment/Plan    PT Assessment Patient needs continued PT services  PT Problem List Decreased strength;Decreased range of motion;Decreased activity tolerance;Decreased mobility;Decreased knowledge of precautions;Decreased safety awareness;Decreased knowledge of use of DME       PT Treatment Interventions DME  instruction;Gait training;Functional mobility training;Therapeutic activities;Therapeutic exercise;Patient/family education    PT Goals (Current goals can be found in the Care Plan section)  Acute Rehab PT Goals Patient Stated Goal: I want to go to rehab to walk , take care of self PT Goal Formulation: With patient Time For Goal Achievement: 06/05/16 Potential to Achieve Goals: Good    Frequency Min 3X/week   Barriers to discharge Decreased caregiver support      Co-evaluation               AM-PAC PT "6 Clicks" Daily Activity  Outcome Measure Difficulty turning over in bed (including adjusting bedclothes, sheets and blankets)?: A Lot Difficulty moving from lying on back to sitting on the side of the bed? : A Lot Difficulty sitting down on and standing up from a chair with arms (e.g., wheelchair, bedside commode, etc,.)?: A Lot Help needed moving to and from a bed to chair (including a wheelchair)?: A Lot Help needed walking in hospital room?: A Lot Help needed climbing 3-5 steps with a railing? : Total 6 Click Score: 11    End of Session   Activity Tolerance: Patient tolerated treatment well Patient left: in chair;with call bell/phone within reach Nurse Communication: Mobility status PT Visit Diagnosis: Difficulty in walking, not elsewhere classified (R26.2)    Time: 1610-9604 PT Time Calculation (min) (ACUTE ONLY): 23 min   Charges:   PT Evaluation $PT Eval Low Complexity: 1 Procedure PT Treatments $Therapeutic Activity: 8-22 mins   PT G CodesBlanchard Kelch PT [phone]  Rada Hay 05/22/2016, 9:08 AM

## 2016-05-22 NOTE — Progress Notes (Signed)
  Progress Note    05/22/2016 8:22 AM 1 Day Post-Op  Subjective:  More pain in left foot than right  Vitals:   05/22/16 0554 05/22/16 0711  BP:    Pulse:    Resp:    Temp: (!) 101.9 F (38.8 C) 98.8 F (37.1 C)    Physical Exam: aaox3 Non labored respirations Abdomen is soft Incisions are cdi Signals at peroneal/pt at ankle, cap refill <2s on right Left foot remains cool without arterial signals, stable from baseline  CBC    Component Value Date/Time   WBC 9.9 05/22/2016 0449   RBC 3.17 (L) 05/22/2016 0449   HGB 9.3 (L) 05/22/2016 0449   HCT 29.5 (L) 05/22/2016 0449   HCT 37.0 04/14/2016 1431   PLT 265 05/22/2016 0449   PLT 326 04/14/2016 1431   MCV 93.1 05/22/2016 0449   MCV 88 04/14/2016 1431   MCH 29.3 05/22/2016 0449   MCHC 31.5 05/22/2016 0449   RDW 13.2 05/22/2016 0449   RDW 13.1 04/14/2016 1431    BMET    Component Value Date/Time   NA 136 05/22/2016 0449   NA 140 04/14/2016 1431   K 3.3 (L) 05/22/2016 0449   CL 105 05/22/2016 0449   CO2 25 05/22/2016 0449   GLUCOSE 80 05/22/2016 0449   BUN 9 05/22/2016 0449   BUN 19 04/14/2016 1431   CREATININE 0.49 05/22/2016 0449   CALCIUM 7.8 (L) 05/22/2016 0449   GFRNONAA >60 05/22/2016 0449   GFRAA >60 05/22/2016 0449    INR    Component Value Date/Time   INR 0.93 05/13/2016 1227     Intake/Output Summary (Last 24 hours) at 05/22/16 0822 Last data filed at 05/22/16 0607  Gross per 24 hour  Intake          2822.92 ml  Output             3160 ml  Net          -337.08 ml   FINDINGS: Slightly shallow inspiration. Postoperative changes in the mediastinum. Cardiac enlargement. No pulmonary vascular congestion or edema. No focal consolidation. No blunting of costophrenic angles. No pneumothorax. Calcified and tortuous aorta.  IMPRESSION: Cardiac enlargement.  No evidence of active pulmonary disease.  Assessment:  66 y.o. female is pod#1 right fem-pop bypass with graft for wounds, febrile  overnight  Plan: -blood/urine cultures -encouraged incentive spirometry -pt/oob -transfer to 2w when bed available  -perioperative antibiotics -resume asa and plavix   Ezinne Yogi C. Randie Heinzain, MD [facility] [place] Office: [phone] Pager: [phone]  05/22/2016 8:22 AM

## 2016-05-23 LAB — URINE CULTURE: Culture: NO GROWTH

## 2016-05-23 LAB — GLUCOSE, CAPILLARY
GLUCOSE-CAPILLARY: 118 mg/dL — AB (ref 65–99)
Glucose-Capillary: 129 mg/dL — ABNORMAL HIGH (ref 65–99)
Glucose-Capillary: 219 mg/dL — ABNORMAL HIGH (ref 65–99)
Glucose-Capillary: 158 mg/dL — ABNORMAL HIGH (ref 65–99)

## 2016-05-23 LAB — LACTIC ACID, PLASMA: LACTIC ACID, VENOUS: 0.9 mmol/L (ref 0.5–1.9)

## 2016-05-23 NOTE — Progress Notes (Addendum)
Progress Note    05/23/2016 10:34 AM 2 Days Post-Op  Subjective:  Pt awake and alert this morning  Tm 102.2 temp at 0200 was 100.4 (no vitals recorded this am) HR  60's-90's NSR 110's-120's systolic 96% 2LO2NC  ABx: Vancomycin and Zosyn  Vitals:   05/23/16 0034 05/23/16 0300  BP: (!) 123/39 (!) 118/46  Pulse: 86 69  Resp: 18 20  Temp: 99.4 F (37.4 C) (!) 100.4 F (38 C)    Physical Exam: Cardiac:  regular Lungs:  Non labored Incisions:  Clean and dry-no evidence of infection  Extremities:  + right PT doppler signal   CBC    Component Value Date/Time   WBC 9.9 05/22/2016 0449   RBC 3.17 (L) 05/22/2016 0449   HGB 9.3 (L) 05/22/2016 0449   HCT 29.5 (L) 05/22/2016 0449   HCT 37.0 04/14/2016 1431   PLT 265 05/22/2016 0449   PLT 326 04/14/2016 1431   MCV 93.1 05/22/2016 0449   MCV 88 04/14/2016 1431   MCH 29.3 05/22/2016 0449   MCHC 31.5 05/22/2016 0449   RDW 13.2 05/22/2016 0449   RDW 13.1 04/14/2016 1431    BMET    Component Value Date/Time   NA 136 05/22/2016 0449   NA 140 04/14/2016 1431   K 3.3 (L) 05/22/2016 0449   CL 105 05/22/2016 0449   CO2 25 05/22/2016 0449   GLUCOSE 80 05/22/2016 0449   BUN 9 05/22/2016 0449   BUN 19 04/14/2016 1431   CREATININE 0.49 05/22/2016 0449   CALCIUM 7.8 (L) 05/22/2016 0449   GFRNONAA >60 05/22/2016 0449   GFRAA >60 05/22/2016 0449    INR    Component Value Date/Time   INR 0.93 05/13/2016 1227     Intake/Output Summary (Last 24 hours) at 05/23/16 1034 Last data filed at 05/23/16 1610  Gross per 24 hour  Intake              120 ml  Output                0 ml  Net              120 ml   ABI's 05/22/16: ABI completed: Right ABI of 0.83 is suggestive of mild arterial occlusive disease at rest. Left ABI of 0.18 is suggestive of critical arterial occlusive disease at rest. Previous ABI's obtained on 03/16/16 demonstrated values of 0.35 on the right and 0.29 on the left.   RIGHT    LEFT     PRESSURE WAVEFORM  PRESSURE WAVEFORM  BRACHIAL 147 Triphasic BRACHIAL 133 Triphasic  DP 95 Biphasic DP 26 Monophasic  PT 122 Biphasic PT      RIGHT LEFT  ABI 0.83 0.18     Assessment:  66 y.o. female is s/p:  1.  Right common femoral endarterectomy 2.  Right common femoral to below knee popliteal artery bypass graft with 6mm ringed propatent  2 Days Post-Op  Plan: -pt neuro status improved this morning -pt still with fever of 102.2 yesterday-continue broad spectrum abx given synthetic graft material.  Incisions with no evidence of infection. Urine cx with no growth.  CXR okay with no pulmonary vascular congestion/edema or focal consolidation.  Blood cx pending. -IS 10x/hr -DVT prophylaxis:  Heparin SQ -continue mobilization and OOB -labs in am   Doreatha Massed, PA-C [facility] [phone] 05/23/2016 10:34 AM   I have interviewed patient with PA and agree with assessment and plan above.   Apolinar Junes  C. Randie Heinzain, MD [facility] [place] Office: [phone] Pager: [phone]

## 2016-05-24 LAB — BASIC METABOLIC PANEL
ANION GAP: 11 (ref 5–15)
BUN: 14 mg/dL (ref 6–20)
CALCIUM: 8.1 mg/dL — AB (ref 8.9–10.3)
CO2: 23 mmol/L (ref 22–32)
Chloride: 105 mmol/L (ref 101–111)
Creatinine, Ser: 0.74 mg/dL (ref 0.44–1.00)
GLUCOSE: 122 mg/dL — AB (ref 65–99)
POTASSIUM: 3.5 mmol/L (ref 3.5–5.1)
SODIUM: 139 mmol/L (ref 135–145)

## 2016-05-24 LAB — GLUCOSE, CAPILLARY
GLUCOSE-CAPILLARY: 102 mg/dL — AB (ref 65–99)
GLUCOSE-CAPILLARY: 176 mg/dL — AB (ref 65–99)
Glucose-Capillary: 156 mg/dL — ABNORMAL HIGH (ref 65–99)
Glucose-Capillary: 136 mg/dL — ABNORMAL HIGH (ref 65–99)

## 2016-05-24 LAB — CBC
HCT: 30 % — ABNORMAL LOW (ref 36.0–46.0)
Hemoglobin: 9.8 g/dL — ABNORMAL LOW (ref 12.0–15.0)
MCH: 30.8 pg (ref 26.0–34.0)
MCHC: 32.7 g/dL (ref 30.0–36.0)
MCV: 94.3 fL (ref 78.0–100.0)
PLATELETS: 233 10*3/uL (ref 150–400)
RBC: 3.18 MIL/uL — AB (ref 3.87–5.11)
RDW: 13.4 % (ref 11.5–15.5)
WBC: 10.8 10*3/uL — AB (ref 4.0–10.5)

## 2016-05-24 NOTE — Evaluation (Signed)
Occupational Therapy Evaluation Patient Details Name: Andrea [name] MRN: [id number] DOB: [birth year] Today's Date: 05/24/2016    History of Present Illness 66 year old female history of coronary artery disease status post recent stenting of her bypass graft also has critical limb ischemia on the right with wounds as well as on the left and has high-grade stenosis of her left carotid artery, S/P RIGHT FEMORAL-BELOW KNEE POPLITEAL ARTERY BYPASS GRAFT and RIGHT FEMORAL ENDARTERECTOMY on 05/21/16.   Clinical Impression   Pt reports she was managing ADL independently PTA. Currently pt overall min-mod assist for functional mobility and ADL. Pt presenting with impaired standing balance, generalized weakness, deconditioning, and decreased activity tolerance impacting her independence and safety with ADL and functional mobility. Recommending SNF for follow up to maximize independence and safety with ADL and functional mobility prior to return home alone. Pt would benefit from continued skilled OT to address established goals.     Follow Up Recommendations  SNF;Supervision/Assistance - 24 hour    Equipment Recommendations  3 in 1 bedside commode;Tub/shower seat    Recommendations for Other Services       Precautions / Restrictions Precautions Precautions: Fall Restrictions Weight Bearing Restrictions: No      Mobility Bed Mobility Overal bed mobility: Needs Assistance Bed Mobility: Supine to Sit     Supine to sit: Min guard;HOB elevated     General bed mobility comments: Use of bed rail with HOB elevated. No physical assist required but close min guard for balance  Transfers Overall transfer level: Needs assistance Equipment used: Rolling walker (2 wheeled);1 person hand held assist Transfers: Sit to/from [employer]Stand;Stand Pivot Transfers Sit to Stand: Mod assist;Min guard Stand pivot transfers: Mod assist       General transfer comment: Mod assist for sit to stand from EOB with  HHA, mod assist for stand pivot with HHA. With RW; pt able to perform sit to stand from [facility] with min guard.    Balance Overall balance assessment: Needs assistance Sitting-balance support: Feet supported;No upper extremity supported Sitting balance-Leahy Scale: Good     Standing balance support: Bilateral upper extremity supported;During functional activity Standing balance-Leahy Scale: Poor                             ADL either performed or assessed with clinical judgement   ADL Overall ADL's : Needs assistance/impaired Eating/Feeding: Set up;Sitting   Grooming: Min guard;Sitting   Upper Body Bathing: Minimal assistance;Sitting   Lower Body Bathing: Moderate assistance;Sit to/from stand   Upper Body Dressing : Min guard;Sitting   Lower Body Dressing: Moderate assistance;Sit to/from stand   Toilet Transfer: Moderate assistance;Stand-pivot;BSC Toilet Transfer Details (indicate cue type and reason): Provided HHA for stand pivot to [facility], short distance mobility with RW from Land[employer] FinancialBSC>chair Toileting- Clothing Manipulation and Hygiene: Maximal assistance;Sit to/from stand       Functional mobility during ADLs: Minimal assistance;Rolling walker General ADL Comments: Pt with significant improvement in balance and decreased level of assist with use of RW.     Vision         Perception     Praxis      Pertinent Vitals/Pain Pain Assessment: No/denies pain     Hand Dominance     Extremity/Trunk Assessment Upper Extremity Assessment Upper Extremity Assessment: Generalized weakness   Lower Extremity Assessment Lower Extremity Assessment: Defer to PT evaluation   Cervical / Trunk Assessment Cervical / Trunk Assessment: Kyphotic   Communication [facility]  Cognition Arousal/Alertness: Awake/alert Behavior During Therapy: WFL for tasks assessed/performed Overall Cognitive Status: Within Functional Limits for tasks assessed                                      General Comments       Exercises     Shoulder Instructions      Home Living Family/patient expects to be discharged to:: Private residence Living Arrangements: Alone Available Help at Discharge: Family;Available PRN/intermittently Type of Home: Other(Comment) (townhome) Home Access: Level entry     Home Layout: Two level;Bed/bath upstairs;1/2 bath on main level Alternate Level Stairs-Number of Steps: 1 flight Alternate Level Stairs-Rails: Right;Left Bathroom Shower/Tub: Tub/shower unit;Curtain   Bathroom Toilet: Standard     Home Equipment: Cane - single point;Walker - 4 wheels          Prior Functioning/Environment Level of Independence: Independent with assistive device(s)        Comments: Uses SPC vs furniture walker for household distances and RW for community. Drives. Has bedroom and shower upstairs and sometimes sleeps downstairs on couch/recliner depending on how she feels. Children assist as needed. Pt reports a crowded apt.        OT Problem List: Decreased strength;Decreased range of motion;Decreased activity tolerance;Impaired balance (sitting and/or standing);Decreased knowledge of use of DME or AE      OT Treatment/Interventions: Self-care/ADL training;Energy conservation;DME and/or AE instruction;Therapeutic activities;Patient/family education;Balance training    OT Goals(Current goals can be found in the care plan section) Acute Rehab OT Goals Patient Stated Goal: rehab then home OT Goal Formulation: With patient Time For Goal Achievement: 06/07/16 Potential to Achieve Goals: Good ADL Goals Pt Will Perform Grooming: with supervision;standing (x3 tasks) Pt Will Perform Upper Body Bathing: with supervision;sitting Pt Will Perform Lower Body Bathing: with supervision;sit to/from stand Pt Will Transfer to Toilet: with supervision;ambulating;bedside commode (over toilet) Pt Will Perform Toileting - Clothing  Manipulation and hygiene: with supervision;sit to/from stand  OT Frequency: Min 2X/week   Barriers to D/C: Decreased caregiver support  pt lives alone       Co-evaluation              AM-PAC PT "6 Clicks" Daily Activity     Outcome Measure Help from another person eating meals?: None Help from another person taking care of personal grooming?: A Little Help from another person toileting, which includes using toliet, bedpan, or urinal?: A Lot Help from another person bathing (including washing, rinsing, drying)?: A Lot Help from another person to put on and taking off regular upper body clothing?: A Little Help from another person to put on and taking off regular lower body clothing?: A Lot 6 Click Score: 16   End of Session Equipment Utilized During Treatment: Gait belt;Rolling walker Nurse Communication: Mobility status  Activity Tolerance: Patient tolerated treatment well Patient left: in chair;with call bell/phone within reach;with nursing/sitter in room  OT Visit Diagnosis: Unsteadiness on feet (R26.81);Other abnormalities of gait and mobility (R26.89);Muscle weakness (generalized) (M62.81)                Time: 1610-9604 OT Time Calculation (min): 28 min Charges:  OT General Charges $OT Visit: 1 Procedure OT Evaluation $OT Eval Moderate Complexity: 1 Procedure OT Treatments $Self Care/Home Management : 8-22 mins G-Codes:     Fredric Mare A. Brett Albino, M.S., OTR/L Pager: [phone]  Gaye Alken 05/24/2016, 11:00 AM

## 2016-05-24 NOTE — Progress Notes (Addendum)
  Progress Note    05/24/2016 10:24 AM 3 Days Post-Op  Subjective:  No complaints; says her feet feel okay.  Says she was out of the bed once yesterday.  No more fevers  Tm 99.5 now afebrile HR  60's-70's NSR 100's-130's systolic 94% RA  Vitals:   05/24/16 0600 05/24/16 1000  BP: (!) 135/47 (!) 105/48  Pulse: 65   Resp: 18   Temp: 97.7 F (36.5 C)     Physical Exam: Cardiac:  regular Lungs:  Non labored Incisions:  Both incisions are clean and dry without erythema.   Extremities:  Brisk doppler signals right DP/PT/peroneal; right foot is warm   CBC    Component Value Date/Time   WBC 10.8 (H) 05/24/2016 0414   RBC 3.18 (L) 05/24/2016 0414   HGB 9.8 (L) 05/24/2016 0414   HCT 30.0 (L) 05/24/2016 0414   HCT 37.0 04/14/2016 1431   PLT 233 05/24/2016 0414   PLT 326 04/14/2016 1431   MCV 94.3 05/24/2016 0414   MCV 88 04/14/2016 1431   MCH 30.8 05/24/2016 0414   MCHC 32.7 05/24/2016 0414   RDW 13.4 05/24/2016 0414   RDW 13.1 04/14/2016 1431    BMET    Component Value Date/Time   NA 139 05/24/2016 0414   NA 140 04/14/2016 1431   K 3.5 05/24/2016 0414   CL 105 05/24/2016 0414   CO2 23 05/24/2016 0414   GLUCOSE 122 (H) 05/24/2016 0414   BUN 14 05/24/2016 0414   BUN 19 04/14/2016 1431   CREATININE 0.74 05/24/2016 0414   CALCIUM 8.1 (L) 05/24/2016 0414   GFRNONAA >60 05/24/2016 0414   GFRAA >60 05/24/2016 0414    INR    Component Value Date/Time   INR 0.93 05/13/2016 1227     Intake/Output Summary (Last 24 hours) at 05/24/16 1024 Last data filed at 05/24/16 0100  Gross per 24 hour  Intake              630 ml  Output              300 ml  Net              330 ml     Assessment:  66 y.o. female is s/p:  1. Right common femoral endarterectomy 2. Right common femoral to below knee popliteal artery bypass graft with 6mm ringed propatent   3 Days Post-Op  Plan: -pt fevers are improved.  BC have no growth so far.   -brisk doppler signals right  foot -OOB to chair tid with meals.  Hopefully PT can work with her again today.  Hopefully she has improved and will be able to go home. -DVT prophylaxis:  SQ heparin -she will need her left leg revascularization in the near future when she recovers from this surgery.   Doreatha MassedSamantha Rhyne, PA-C [facility] [phone] 05/24/2016 10:24 AM    I have interviewed patient with PA and agree with assessment and plan above.   Egan Berkheimer C. Randie Heinzain, MD [facility] [place] Office: [phone] Pager: [phone]

## 2016-05-25 ENCOUNTER — Inpatient Hospital Stay (HOSPITAL_COMMUNITY): Payer: BLUE CROSS/BLUE SHIELD

## 2016-05-25 DIAGNOSIS — I739 Peripheral vascular disease, unspecified: Secondary | ICD-10-CM

## 2016-05-25 LAB — CBC WITH DIFFERENTIAL/PLATELET
BASOS ABS: 0 10*3/uL (ref 0.0–0.1)
BASOS PCT: 0 %
EOS ABS: 0.1 10*3/uL (ref 0.0–0.7)
EOS PCT: 1 %
HCT: 28.8 % — ABNORMAL LOW (ref 36.0–46.0)
Hemoglobin: 9.4 g/dL — ABNORMAL LOW (ref 12.0–15.0)
Lymphocytes Relative: 20 %
Lymphs Abs: 1.9 10*3/uL (ref 0.7–4.0)
MCH: 30.2 pg (ref 26.0–34.0)
MCHC: 32.6 g/dL (ref 30.0–36.0)
MCV: 92.6 fL (ref 78.0–100.0)
MONO ABS: 0.3 10*3/uL (ref 0.1–1.0)
MONOS PCT: 3 %
Neutro Abs: 7.1 10*3/uL (ref 1.7–7.7)
Neutrophils Relative %: 76 %
PLATELETS: 266 10*3/uL (ref 150–400)
RBC: 3.11 MIL/uL — ABNORMAL LOW (ref 3.87–5.11)
RDW: 13.4 % (ref 11.5–15.5)
WBC: 9.5 10*3/uL (ref 4.0–10.5)

## 2016-05-25 LAB — GLUCOSE, CAPILLARY
GLUCOSE-CAPILLARY: 129 mg/dL — AB (ref 65–99)
GLUCOSE-CAPILLARY: 141 mg/dL — AB (ref 65–99)
Glucose-Capillary: 182 mg/dL — ABNORMAL HIGH (ref 65–99)
Glucose-Capillary: 123 mg/dL — ABNORMAL HIGH (ref 65–99)
Glucose-Capillary: 121 mg/dL — ABNORMAL HIGH (ref 65–99)

## 2016-05-25 MED ORDER — OXYCODONE-ACETAMINOPHEN 5-325 MG PO TABS: 1.0000 | ORAL_TABLET | ORAL | 0 refills | Status: DC | PRN

## 2016-05-25 MED ORDER — NICOTINE 21 MG/24HR TD PT24
21.0000 mg | MEDICATED_PATCH | Freq: Every day | TRANSDERMAL | Status: DC
  Administered 2016-05-25 – 2016-05-26 (×2): 21 mg via TRANSDERMAL
  Filled 2016-05-25 (×2): qty 1

## 2016-05-25 NOTE — Progress Notes (Signed)
Physical Therapy Treatment Patient Details Name: Andrea [name] MRN: [id number] DOB: [birth year] Today's Date: 05/25/2016    History of Present Illness 66 year old female history of CABG has critical limb ischemia on the right with wounds as well as on the left and has high-grade stenosis of her left carotid artery, S/P RIGHT FEMORAL-BELOW KNEE POPLITEAL ARTERY BYPASS GRAFT and RIGHT FEMORAL ENDARTERECTOMY on 05/21/16.    PT Comments    Pt with continued RLE pain but able to progress to walking in room today. Pt encouraged to be OOB to [facility] with nursing and start ambulating to bathroom. Pt educated for HEP and positioning. Will continue to follow   Follow Up Recommendations  SNF;Supervision/Assistance - 24 hour     Equipment Recommendations       Recommendations for Other Services       Precautions / Restrictions Precautions Precautions: Fall Restrictions Weight Bearing Restrictions: No RLE Weight Bearing: Weight bearing as tolerated LLE Weight Bearing: Weight bearing as tolerated    Mobility  Bed Mobility Overal bed mobility: Modified Independent Bed Mobility: Supine to Sit           General bed mobility comments: Use of bed rail with HOB elevated. No physical assist required but close min guard for balance, increased time  Transfers Overall transfer level: Needs assistance   Transfers: Sit to/from Stand Sit to Stand: Min assist         General transfer comment: cues for hand placement and assist to rise from bed, BSC  Ambulation/Gait Ambulation/Gait assistance: Min guard Ambulation Distance (Feet): 33 Feet Assistive device: Rolling walker (2 wheeled) Gait Pattern/deviations: Step-to pattern;Decreased stride length;Trunk flexed   Gait velocity interpretation: Below normal speed for age/gender General Gait Details: cues for sequence. pt walked 6' then 56' and denied further distance due to pain and fatigue   Stairs            Wheelchair Mobility     Modified Rankin (Stroke Patients Only)       Balance Overall balance assessment: Needs assistance   Sitting balance-Leahy Scale: Good       Standing balance-Leahy Scale: Fair                              Cognition Arousal/Alertness: Awake/alert Behavior During Therapy: WFL for tasks assessed/performed Overall Cognitive Status: Within Functional Limits for tasks assessed                                        Exercises General Exercises - Lower Extremity Long Arc Quad: AROM;Both;Seated;10 reps Hip Flexion/Marching: AROM;Both;Seated;10 reps    General Comments        Pertinent Vitals/Pain Pain Assessment: 0-10 Pain Score: 8  Pain Location: RLE Pain Descriptors / Indicators: Aching Pain Intervention(s): Limited activity within patient's tolerance    Home Living                      Prior Function            PT Goals (current goals can now be found in the care plan section) Progress towards PT goals: Progressing toward goals    Frequency           PT Plan Current plan remains appropriate    Co-evaluation  AM-PAC PT "6 Clicks" Daily Activity  Outcome Measure  Difficulty turning over in bed (including adjusting bedclothes, sheets and blankets)?: A Little Difficulty moving from lying on back to sitting on the side of the bed? : A Lot Difficulty sitting down on and standing up from a chair with arms (e.g., wheelchair, bedside commode, etc,.)?: A Lot Help needed moving to and from a bed to chair (including a wheelchair)?: A Little Help needed walking in hospital room?: A Little Help needed climbing 3-5 steps with a railing? : A Lot 6 Click Score: 15    End of Session Equipment Utilized During Treatment: Gait belt Activity Tolerance: Patient tolerated treatment well Patient left: in chair;with call bell/phone within reach;with chair alarm set Nurse Communication: Mobility status PT Visit  Diagnosis: Difficulty in walking, not elsewhere classified (R26.2);Pain Pain - Right/Left: Right Pain - part of body: Leg     Time: [id number] PT Time Calculation (min) (ACUTE ONLY): 31 min  Charges:  $Gait Training: 8-22 mins $Therapeutic Exercise: 8-22 mins                    G Codes:       Delaney MeigsMaija Tabor Scottlyn Mchaney, PT [phone]  Jamille Yoshino B Savvy Peeters 05/25/2016, 11:23 AM

## 2016-05-25 NOTE — Clinical Social Work Note (Signed)
Clinical Social Work Assessment  Patient Details  Name: Andrea [name] MRN: [id number] Date of Birth: 1951-01-01  Date of referral:  05/25/16               Reason for consult:  Discharge Planning                Permission sought to share information with:  Family Supports Permission granted to share information::     Name::     Andrea [name]  Agency::     Relationship::  step-daughter  Contact Information:  [phone]  Housing/Transportation Living arrangements for the past 2 months:  Single Family Home Source of Information:  Patient Patient Interpreter Needed:  None Criminal Activity/Legal Involvement Pertinent to Current Situation/Hospitalization:  No - Comment as needed Significant Relationships:  Adult Children Lives with:  Self Do you feel safe going back to the place where you live?  Yes Need for family participation in patient care:  No (Coment)  Care giving concerns:  Patient lives alone but has family support    Office managerocial Worker assessment / plan:  Visual merchandiserClinical Social Worker spoke with patients step-daughter via phone. Patient daughter Andrea DecemberSharon stated that she is agreeable to send patient to SNF as long as it within Andrea [name]. CSW to complete necessary paperwork and initiate SNF search on patient behalf. CSW remains available for support and to facilitate patient discharge needs once medically stable  Employment status:  Retired Health and safety inspectornsurance information:  Other (Comment Required) Psychologist, occupational(BC/BS) PT Recommendations:  Skilled Nursing Facility Information / Referral to community resources:  Skilled Nursing Facility  Patient/Family's Response to care:  Patient verbalized appreciation and understanding for CSW role and involvement in care  Patient/Family's Understanding of and Emotional Response to Diagnosis, Current Treatment, and Prognosis:  Family  with good understanding of current medical state and limitations around most recent hospitalization  Emotional Assessment Appearance:   Appears stated age Attitude/Demeanor/Rapport:  Unable to Assess Affect (typically observed):  Unable to Assess Orientation:  Oriented to Self Alcohol / Substance use:  Not Applicable Psych involvement (Current and /or in the community):  No (Comment)  Discharge Needs  Concerns to be addressed:  No discharge needs identified Readmission within the last 30 days:  No Current discharge risk:  None Barriers to Discharge:  No Barriers Identified   Andrea Charonshley C Damiana Berrian, LCSW 05/25/2016, 3:44 PM

## 2016-05-25 NOTE — Discharge Summary (Addendum)
[facility] Discharge Summary   Patient ID:  Andrea [name] MRN: [id number] DOB/AGE: 1950/07/02 66 y.o.  Admit date: 05/21/2016 Discharge date: 05/25/2016 Date of Surgery: 05/21/2016 Surgeon: Surgeon(s): Waynetta Sandy, MD  Admission Diagnosis: Peripheral vascular disease with right foot wound I70.234  Discharge Diagnoses:  Peripheral vascular disease with right foot wound I70.234  Secondary Diagnoses: Past Medical History:  Diagnosis Date  . Asthma   . Coronary artery disease   . Diabetes mellitus without complication ([place])   . Dyspnea   . Fibromyalgia   . GERD (gastroesophageal reflux disease)   . High cholesterol   . Hypertension   . PONV (postoperative nausea and vomiting)   . PVD (peripheral vascular disease) ([place])   . Urinary frequency   . Urinary incontinence     Procedure(s): RIGHT FEMORAL-BELOW KNEE POPLITEAL ARTERY BYPASS GRAFT USING PROPATEN GORETEX GRAFT RIGHT FEMORAL ENDARTERECTOMY  Discharged Condition: good  HPI: Andrea [name] is a 66 y.o. female here for evaluation of bilateral lower extremity pain as well as a wound on her right foot. She does have a history of coronary artery disease having had bypass surgery as well as diabetes. She states that the wound on the dorsum of her right foot began with a low-grade trauma perhaps from a cat scratch 1 month ago. It is now progressed having dry ulceration. She also has progressive redness to both of her feet as well as pain. She does not walk much because of the pain but does not get specific claudication-like cramping. Rather she has burning in her bilateral lower extremities and she is also very weak. She has never had intervention for her bilateral lower extremities. She does not have fevers or chills to go along with her pain. She has never had ulceration or toe intervention that needed to heal before. She is very tearful on our evaluation today. She continues to smoke daily and has  smoked more of late because of the pain. She does demonstrate on desire to quit at this time. She has never had stroke TIA or amaurosis suggest carotid artery disease. She is not accompanied by any family members today. She does take aspirin and Lipitor.   Hospital Course:  [name] is a 66 y.o. female is S/P Procedure(s): RIGHT FEMORAL-BELOW KNEE POPLITEAL ARTERY BYPASS GRAFT USING PROPATEN GORETEX GRAFT RIGHT FEMORAL ENDARTERECTOMY  Improved ABI's right 0.35 now 0.83 pt still with fever of 102.2 yesterday-continue broad spectrum abx given synthetic graft material.  Incisions with no evidence of infection. Urine cx with no growth.  CXR okay with no pulmonary vascular congestion/edema or focal consolidation.  Blood cx pending.  She will need her left leg revascularization in the near future when she recovers from this surgery.   Pain interferes with mobility and she is unsteady with rolling walker.  She lives alone.  Plan for SNF at discharge.  She will discuss the left LE by pass surgery plan at her f/u visit once she has recovered from this surgery.   WBC elevated and fever now within normal limits for 24 hours. Stable disposition for discharge.  Significant Diagnostic Studies: CBC Lab Results  Component Value Date   WBC 9.5 05/25/2016   HGB 9.4 (L) 05/25/2016   HCT 28.8 (L) 05/25/2016   MCV 92.6 05/25/2016   PLT 266 05/25/2016    BMET    Component Value Date/Time   NA 139 05/24/2016 0414   NA 140 04/14/2016 1431   K 3.5 05/24/2016 0414  CL 105 05/24/2016 0414   CO2 23 05/24/2016 0414   GLUCOSE 122 (H) 05/24/2016 0414   BUN 14 05/24/2016 0414   BUN 19 04/14/2016 1431   CREATININE 0.74 05/24/2016 0414   CALCIUM 8.1 (L) 05/24/2016 0414   GFRNONAA >60 05/24/2016 0414   GFRAA >60 05/24/2016 0414   COAG Lab Results  Component Value Date   INR 0.93 05/13/2016   INR 0.9 04/14/2016     Disposition:  Discharge to :Skilled nursing facility Discharge  Instructions    Activity as tolerated - No restrictions    Complete by:  As directed    Call MD for:  redness, tenderness, or signs of infection (pain, swelling, bleeding, redness, odor or green/yellow discharge around incision site)    Complete by:  As directed    Call MD for:  severe or increased pain, loss or decreased feeling  in affected limb(s)    Complete by:  As directed    Call MD for:  temperature >100.5    Complete by:  As directed    Discharge instructions    Complete by:  As directed    You may shower daily   Driving Restrictions    Complete by:  As directed    No driving for 2 weeks   Lifting restrictions    Complete by:  As directed    No heavy lifting for 2 weeks   Resume previous diet    Complete by:  As directed      Allergies as of 05/25/2016      Reactions   Naproxen Itching   Tramadol Hcl Itching      Medication List    TAKE these medications   albuterol 108 (90 Base) MCG/ACT inhaler Commonly known as:  PROVENTIL HFA;VENTOLIN HFA Inhale 2 puffs into the lungs every 6 (six) hours as needed for wheezing or shortness of breath.   aspirin EC 81 MG tablet Take 1 tablet (81 mg total) by mouth daily.   atorvastatin 80 MG tablet Commonly known as:  LIPITOR Take 1 tablet (80 mg total) by mouth daily.   carbamide peroxide 6.5 % otic solution Commonly known as:  DEBROX Place 5 drops into both ears daily as needed (WAX BUILDUP).   clopidogrel 75 MG tablet Commonly known as:  PLAVIX Take 1 tablet (75 mg total) by mouth daily with breakfast.   Fluticasone-Salmeterol 100-50 MCG/DOSE Aepb Commonly known as:  ADVAIR Inhale 1 puff into the lungs 2 (two) times daily.   gabapentin 300 MG capsule Commonly known as:  NEURONTIN Take 600 mg by mouth 3 (three) times daily.   glucose blood test strip Commonly known as:  TRUE METRIX BLOOD GLUCOSE TEST Use as instructed What changed:  how much to take  how to take this  when to take this  additional  instructions   hydrochlorothiazide 25 MG tablet Commonly known as:  HYDRODIURIL Take 1 tablet (25 mg total) by mouth daily.   Insulin Glargine 100 UNIT/ML Solostar Pen Commonly known as:  LANTUS SOLOSTAR Inject 30 Units into the skin daily at 10 pm.   Insulin Pen Needle 32G X 4 MM Misc Commonly known as:  ULTICARE MICRO PEN NEEDLES 1 applicator by Does not apply route at bedtime. What changed:  when to take this   lisinopril 10 MG tablet Commonly known as:  PRINIVIL,ZESTRIL Take 1 tablet (10 mg total) by mouth daily.   metFORMIN 1000 MG tablet Commonly known as:  GLUCOPHAGE Take 1 tablet (1,000 mg  total) by mouth 2 (two) times daily. Restart 48 hrs after discharge.   metoprolol tartrate 25 MG tablet Commonly known as:  LOPRESSOR Take 0.5 tablets (12.5 mg total) by mouth 2 (two) times daily.   omeprazole 20 MG capsule Commonly known as:  PRILOSEC TAKE 1 CAPSULE BY MOUTH DAILY.   oxybutynin 5 MG tablet Commonly known as:  DITROPAN TAKE 1 TABLET BY MOUTH 2 TIMES DAILY.   oxyCODONE-acetaminophen 5-325 MG tablet Commonly known as:  PERCOCET/ROXICET Take 1-2 tablets by mouth every 4 (four) hours as needed for moderate pain.   TRUE METRIX GO GLUCOSE METER w/Device Kit 1 each by Does not apply route every 8 (eight) hours as needed. What changed:  when to take this   TRUEPLUS LANCETS 26G Misc 1 each by Does not apply route every 8 (eight) hours as needed. What changed:  how to take this  when to take this      Verbal and written Discharge instructions given to the patient. Wound care per Discharge AVS  F/U in 2-3 weeks with Dr. Donzetta Matters   Signed: Theda Sers, Kari Kerth [facility] 05/25/2016, 7:24 AM - For VQI Registry use --- Instructions: Press F2 to tab through selections.  Delete question if not applicable.   Post-op:  Wound infection: No  Graft infection: No  Transfusion: No  If yes, 0 units given New Arrhythmia: No Ipsilateral amputation: [x ] no, [ ]  Minor, [ ]  BKA, [  ] AKA Discharge patency: [x ] Primary, [ ]  Primary assisted, [ ]  Secondary, [ ]  Occluded Patency judged by: [x ] Dopper only, [ ]  Palpable graft pulse, [ ]  Palpable distal pulse, [ ]  ABI inc. > 0.15, [ ]  Duplex Discharge ABI: R 0.83, L 0.18 D/C Ambulatory Status: Ambulatory with Assistance  Complications: MI: [x ] No, [ ]  Troponin only, [ ]  EKG or Clinical CHF: No Resp failure: [x ] none, [ ]  Pneumonia, [ ]  Ventilator Chg in renal function: [x ] none, [ ]  Inc. Cr > 0.5, [ ]  Temp. Dialysis, [ ]  Permanent dialysis Stroke: [x ] None, [ ]  Minor, [ ]  Major Return to OR: No  Reason for return to OR: [ ]  Bleeding, [ ]  Infection, [ ]  Thrombosis, [ ]  Revision  Discharge medications: Statin use:  Yes ASA use:  Yes Plavix use:  Yes Beta blocker use: Yes Coumadin use: No  for medical reason

## 2016-05-25 NOTE — Progress Notes (Addendum)
[facility] [place]  Subjective  - Still moving slow and unsteady.   Objective 117/64 89 98.7 F (37.1 C) (Axillary) 18 93%  Intake/Output Summary (Last 24 hours) at 05/25/16 0718 Last data filed at 05/25/16 0100  Gross per 24 hour  Intake              100 ml  Output             1100 ml  Net            -1000 ml    Right groin and leg incision clean and dry without hematoma Right foot wounds without change dry Right foot warm with active range of motion intact Lungs non labored breathing Heart RRR  Assessment/Planning:  66 y.o. female is s/p:  1. Right common femoral endarterectomy 2. Right common femoral to below knee popliteal artery bypass graft with 6mm ringed propatent  3 Days Post-Op  Will consult social work regarding  SNF placement, she lives alone  GonzalesOLLINS, ArizonaMMA [facility] 05/25/2016 7:18 AM --  Laboratory Lab Results:  Recent Labs  05/24/16 0414 05/25/16 0349  WBC 10.8* 9.5  HGB 9.8* 9.4*  HCT 30.0* 28.8*  PLT 233 266   BMET  Recent Labs  05/24/16 0414  NA 139  K 3.5  CL 105  CO2 23  GLUCOSE 122*  BUN 14  CREATININE 0.74  CALCIUM 8.1*    COAG Lab Results  Component Value Date   INR 0.93 05/13/2016   INR 0.9 04/14/2016   No results found for: PTT  I have independently interviewed patient and agree with PA assessment and plan above. abx discontinued and will check dvt study for persistent fevers. Will need snf on discharge  Nashid Pellum C. Randie Heinzain, MD [facility] [place] Office: [phone] Pager: [phone]

## 2016-05-25 NOTE — Progress Notes (Signed)
**  Preliminary report by tech**  Bilateral lower extremity venous duplex completed. There is no evidence of deep or superficial vein thrombosis involving the right and left lower extremities. All visualized vessels appear patent and compressible. There is no evidence of Baker's cysts bilaterally.  05/25/16 2:36 PM Olen CordialGreg Laster Appling RVT

## 2016-05-26 LAB — GLUCOSE, CAPILLARY
GLUCOSE-CAPILLARY: 102 mg/dL — AB (ref 65–99)
GLUCOSE-CAPILLARY: 115 mg/dL — AB (ref 65–99)
Glucose-Capillary: 133 mg/dL — ABNORMAL HIGH (ref 65–99)

## 2016-05-26 NOTE — Progress Notes (Signed)
Physical Therapy Treatment Patient Details Name: Andrea [name] MRN: [id number] DOB: 09/28/1950 Today's Date: 05/26/2016    History of Present Illness 66 year old female history of CABG has critical limb ischemia on the right with wounds as well as on the left and has high-grade stenosis of her left carotid artery, S/P RIGHT FEMORAL-BELOW KNEE POPLITEAL ARTERY BYPASS GRAFT and RIGHT FEMORAL ENDARTERECTOMY on 05/21/16.    PT Comments    Pt with excellent progress today and able to ambulate in hallway. Decreased speed with gait and one LOB but significantly improved mobility from prior visit. Pt encouraged to walk to bathroom with nursing and continue HEP. Will continue to follow.   Follow Up Recommendations  SNF;Supervision/Assistance - 24 hour     Equipment Recommendations       Recommendations for Other Services       Precautions / Restrictions Precautions Precautions: Fall Restrictions Weight Bearing Restrictions: No RLE Weight Bearing: Weight bearing as tolerated LLE Weight Bearing: Weight bearing as tolerated    Mobility  Bed Mobility Overal bed mobility: Modified Independent             General bed mobility comments: Use of bed rail with HOB elevated. No physical assist required but close min guard for balance, increased time  Transfers Overall transfer level: Needs assistance   Transfers: Sit to/from Stand Sit to Stand: Min guard         General transfer comment: cues for hand placement  from bed, BSC  Ambulation/Gait Ambulation/Gait assistance: Min assist Ambulation Distance (Feet): 170 Feet Assistive device: Rolling walker (2 wheeled) Gait Pattern/deviations: Step-through pattern;Decreased stride length;Trunk flexed     General Gait Details: cues for sequence, posture, looking up and position in RW. I LOB with assist to correct. Standing rest halfway   Stairs            Wheelchair Mobility    Modified Rankin (Stroke Patients Only)        Balance                                            Cognition Arousal/Alertness: Awake/alert Behavior During Therapy: WFL for tasks assessed/performed Overall Cognitive Status: Within Functional Limits for tasks assessed                                        Exercises General Exercises - Lower Extremity Long Arc Quad: AROM;Both;Seated;15 reps Hip Flexion/Marching: AROM;Both;Seated;15 reps    General Comments        Pertinent Vitals/Pain Pain Score: 5  Pain Location: RLE Pain Descriptors / Indicators: Aching Pain Intervention(s): Limited activity within patient's tolerance;Repositioned;Monitored during session    Home Living                      Prior Function            PT Goals (current goals can now be found in the care plan section) Progress towards PT goals: Progressing toward goals    Frequency           PT Plan Current plan remains appropriate    Co-evaluation              AM-PAC PT "6 Clicks" Daily Activity  Outcome Measure  Difficulty turning over in bed (including  adjusting bedclothes, sheets and blankets)?: A Little Difficulty moving from lying on back to sitting on the side of the bed? : A Lot Difficulty sitting down on and standing up from a chair with arms (e.g., wheelchair, bedside commode, etc,.)?: A Lot Help needed moving to and from a bed to chair (including a wheelchair)?: A Little Help needed walking in hospital room?: A Little Help needed climbing 3-5 steps with a railing? : A Lot 6 Click Score: 15    End of Session Equipment Utilized During Treatment: Gait belt Activity Tolerance: Patient tolerated treatment well Patient left: in chair;with call bell/phone within reach;with chair alarm set Nurse Communication: Mobility status PT Visit Diagnosis: Difficulty in walking, not elsewhere classified (R26.2);Pain Pain - Right/Left: Right Pain - part of body: Leg     Time:  [id number] PT Time Calculation (min) (ACUTE ONLY): 31 min  Charges:  $Gait Training: 8-22 mins $Therapeutic Exercise: 8-22 mins                    G Codes:       Delaney MeigsMaija Tabor Haylen Shelnutt, PT [phone]   Paighton Godette B Gwenlyn Hottinger 05/26/2016, 10:00 AM

## 2016-05-26 NOTE — Care Management Note (Signed)
Case Management Note Previous CM note initiated by Leone Havenaylor, Deborah Clinton, RN--05/21/2016, 4:31 PM    Patient Details  Name: Andrea [name] MRN: [id number] Date of Birth: Jan 15, 1950  Subjective/Objective:      s/p  Right common femoral endarterectomy, Right common femoral to below knee popliteal artery bypass. Await pt eval.   5/11 1228 Letha Capeeborah Taylor RN, BSN- per pt eval rec SNF, CSW referral.              Action/Plan: NCM will follow for dc needs.  Expected Discharge Date:  05/26/16               Expected Discharge Plan:  Skilled Nursing Facility  In-House Referral:  Clinical Social Work  Discharge planning Services  CM Consult  Post Acute Care Choice:  NA Choice offered to:  NA  DME Arranged:    DME Agency:     HH Arranged:    HH Agency:     Status of Service:  Completed, signed off  If discussed at [employer] Length of Stay Meetings, dates discussed:  5/14  Discharge Disposition:  Skilled facility   Additional Comments:  05/25/16- 1100- Kessler Solly RN, CM- pt for d/c today- plan for SNF- CSW following for placement needs  Darrold SpanWebster, Congetta Odriscoll Hall, RN 05/26/2016, 11:17 AM [phone]

## 2016-05-26 NOTE — Progress Notes (Addendum)
[facility] [place]  Subjective  - Patient now afebrile for 24 hours.   Objective (!) 109/42 75 98.5 F (36.9 C) (Oral) 20 95%  Intake/Output Summary (Last 24 hours) at 05/26/16 0952 Last data filed at 05/26/16 0703  Gross per 24 hour  Intake              360 ml  Output              300 ml  Net               60 ml     Incisions clean and dry Right foot wounds dry Lungs non labored Heart RRR  Assessment/Planning: 66 y.o.femaleis s/p:  1. Right common femoral endarterectomy 2. Right common femoral to below knee popliteal artery bypass graft with 6mm ringed propatent 4 Days Post-Op  Will consult social work regarding  SNF placement, she lives alone  By pass is viable  Clinton GallantCOLLINS, EMMA [facility] 05/26/2016 9:52 AM --  Laboratory Lab Results:  Recent Labs  05/24/16 0414 05/25/16 0349  WBC 10.8* 9.5  HGB 9.8* 9.4*  HCT 30.0* 28.8*  PLT 233 266   BMET  Recent Labs  05/24/16 0414  NA 139  K 3.5  CL 105  CO2 23  GLUCOSE 122*  BUN 14  CREATININE 0.74  CALCIUM 8.1*    COAG Lab Results  Component Value Date   INR 0.93 05/13/2016   INR 0.9 04/14/2016   No results found for: PTT   I have independently interviewed patient and agree with PA assessment and plan above.   Brandon C. Randie Heinzain, MD [facility] [place] Office: [phone] Pager: [phone]

## 2016-05-26 NOTE — [place] FL2 (Signed)
[place] MEDICAID FL2 LEVEL OF CARE SCREENING TOOL     IDENTIFICATION  Patient Name: Andrea [name] Birthdate: April 22, 1950 Sex: female Admission Date (Current Location): 05/21/2016  [facility] and [place] Number:  Producer, television/film/video and Address:  [facility], [address], [place], [place] [address]      Provider Number: [id number]  Attending Physician Name and Address:  Juventino Slovak*  Relative Name and Phone Number:  [name], [phone]    Current Level of Care: Hospital Recommended Level of Care: Skilled Nursing Facility Prior Approval Number:    Date Approved/Denied:   PASRR Number: [id number]  Discharge Plan: SNF    Current Diagnoses: Patient Active Problem List   Diagnosis Date Noted  . CAD (coronary artery disease) of artery bypass graft 04/15/2016  . Coronary artery disease involving coronary bypass graft of native heart with angina pectoris (HCC)   . Coronary artery disease involving native coronary artery of native heart without angina pectoris 04/07/2016  . PAD (peripheral artery disease) (HCC) 03/30/2016  . Diabetic foot ulcers (HCC) 03/30/2016  . Hyperlipidemia 03/30/2016  . Diabetic neuropathy (HCC) 02/24/2016  . Tobacco abuse 02/24/2016  . Overactive bladder 01/14/2016  . Diabetes mellitus without complication (HCC) 10/18/2006  . Essential hypertension 10/18/2006  . LOW BACK PAIN, CHRONIC 10/18/2006    Orientation RESPIRATION BLADDER Height & Weight     Self  Normal Continent Weight: 143 lb 15.4 oz (65.3 kg) Height:  5\' 2"  (157.5 cm)  BEHAVIORAL SYMPTOMS/MOOD NEUROLOGICAL BOWEL NUTRITION STATUS      Continent Diet (heart healthy )  AMBULATORY STATUS COMMUNICATION OF NEEDS Skin   Limited Assist Verbally Normal                       Personal Care Assistance Level of Assistance  Bathing, Feeding, Dressing Bathing Assistance: Limited assistance Feeding assistance: Independent Dressing  Assistance: Limited assistance     Functional Limitations Info  Sight, Hearing, Speech Sight Info: Adequate Hearing Info: Adequate Speech Info: Adequate    SPECIAL CARE FACTORS FREQUENCY  PT (By licensed PT), OT (By licensed OT)     PT Frequency: 5x wk OT Frequency: 5x wk            Contractures Contractures Info: Not present    Additional Factors Info  Code Status, Allergies Code Status Info: Full Code Allergies Info: NAPROXEN, TRAMADOL HCL           Current Medications (05/26/2016):  This is the current hospital active medication list Current Facility-Administered Medications  Medication Dose Route Frequency Provider Last Rate Last Dose  . 0.9 %  sodium chloride infusion  500 mL Intravenous Once PRN Clinton Gallant M, PA-C      . acetaminophen (TYLENOL) tablet 325-650 mg  325-650 mg Oral Q4H PRN Lars Mage, PA-C   650 mg at 05/25/16 0144   Or  . acetaminophen (TYLENOL) suppository 325-650 mg  325-650 mg Rectal Q4H PRN Lars Mage, PA-C      . albuterol (PROVENTIL) (2.5 MG/3ML) 0.083% nebulizer solution 2.5 mg  2.5 mg Nebulization Q6H PRN Clinton Gallant M, PA-C      . alum & mag hydroxide-simeth (MAALOX/MYLANTA) 200-200-20 MG/5ML suspension 15-30 mL  15-30 mL Oral Q2H PRN Clinton Gallant M, PA-C      . aspirin EC tablet 81 mg  81 mg Oral Daily Clinton Gallant M, PA-C   81 mg at 05/26/16 0947  . atorvastatin (LIPITOR) tablet  80 mg  80 mg Oral Daily Clinton GallantCollins, Emma M, PA-C   80 mg at 05/26/16 [id number]  . bisacodyl (DULCOLAX) EC tablet 5 mg  5 mg Oral Daily PRN Clinton Gallantollins, Emma M, PA-C   5 mg at 05/24/16 1000  . clopidogrel (PLAVIX) tablet 75 mg  75 mg Oral Q breakfast Clinton GallantCollins, Emma M, PA-C   75 mg at 05/26/16 [id number]  . docusate sodium (COLACE) capsule 100 mg  100 mg Oral Daily Clinton GallantCollins, Emma M, PA-C   100 mg at 05/26/16 [id number]  . gabapentin (NEURONTIN) capsule 600 mg  600 mg Oral TID Lars Mageollins, Emma M, PA-C   600 mg at 05/26/16 [id number]  . guaiFENesin-dextromethorphan (ROBITUSSIN DM) 100-10  MG/5ML syrup 15 mL  15 mL Oral Q4H PRN Clinton Gallantollins, Emma M, PA-C      . heparin injection 5,000 Units  5,000 Units Subcutaneous Q8H Clinton GallantCollins, Emma M, PA-C   5,000 Units at 05/26/16 1353  . hydrALAZINE (APRESOLINE) injection 5 mg  5 mg Intravenous Q20 Min PRN Clinton Gallantollins, Emma M, PA-C      . hydrochlorothiazide (HYDRODIURIL) tablet 25 mg  25 mg Oral Daily Clinton GallantCollins, Emma M, PA-C   25 mg at 05/26/16 [id number]  . insulin aspart (novoLOG) injection 0-9 Units  0-9 Units Subcutaneous TID WC Lars Mageollins, Emma M, PA-C   1 Units at 05/25/16 1739  . insulin glargine (LANTUS) injection 30 Units  30 Units Subcutaneous Q2200 Lars MageCollins, Emma M, [place]   30 Units at 05/25/16 2109  . labetalol (NORMODYNE,TRANDATE) injection 10 mg  10 mg Intravenous Q10 min PRN Clinton Gallantollins, Emma M, PA-C      . lisinopril (PRINIVIL,ZESTRIL) tablet 10 mg  10 mg Oral Daily Clinton GallantCollins, Emma M, PA-C   10 mg at 05/26/16 0947  . magnesium sulfate IVPB 2 g 50 mL  2 g Intravenous Daily PRN Clinton Gallantollins, Emma M, PA-C      . metFORMIN (GLUCOPHAGE) tablet 1,000 mg  1,000 mg Oral BID WC Clinton Gallantollins, Emma M, PA-C   1,000 mg at 05/26/16 0948  . metoprolol (LOPRESSOR) injection 2-5 mg  2-5 mg Intravenous Q2H PRN Clinton Gallantollins, Emma M, PA-C      . metoprolol tartrate (LOPRESSOR) tablet 12.5 mg  12.5 mg Oral BID Clinton GallantCollins, Emma M, PA-C   12.5 mg at 05/26/16 0947  . morphine 4 MG/ML injection 2 mg  2 mg Intravenous Q2H PRN Rhyne, Samantha J, PA-C   2 mg at 05/24/16 1910  . nicotine (NICODERM CQ - dosed in mg/24 hours) patch 21 mg  21 mg Transdermal Daily Early, Kristen Loaderodd F, MD   21 mg at 05/26/16 0950  . ondansetron (ZOFRAN) injection 4 mg  4 mg Intravenous Q6H PRN Clinton Gallantollins, Emma M, PA-C   4 mg at 05/25/16 1742  . oxybutynin (DITROPAN) tablet 5 mg  5 mg Oral BID Clinton GallantCollins, Emma M, PA-C   5 mg at 05/26/16 [id number]  . oxyCODONE-acetaminophen (PERCOCET/ROXICET) 5-325 MG per tablet 1-2 tablet  1-2 tablet Oral Q4H PRN Lars Mageollins, Emma M, PA-C   2 tablet at 05/26/16 0422  . pantoprazole (PROTONIX) EC tablet 40 mg  40  mg Oral Daily Clinton GallantCollins, Emma M, PA-C   40 mg at 05/26/16 [id number]  . phenol (CHLORASEPTIC) mouth spray 1 spray  1 spray Mouth/Throat PRN Clinton Gallantollins, Emma M, PA-C      . polyethylene glycol (MIRALAX / GLYCOLAX) packet 17 g  17 g Oral Daily PRN Lars MageCollins, Emma M, PA-C   17 g at 05/25/16 [id number]     Discharge Medications: Please  see discharge summary for a list of discharge medications.  Relevant Imaging Results:  Relevant Lab Results:   Additional Information SS#[id number]  Althea Charon, LCSW

## 2016-05-26 NOTE — Progress Notes (Signed)
Pt discharged to [facility]. IV and telemetry box removed. Pt transported via [facility]. Pt left with all of her belongings. Report was called to receiving nurse, Shanda BumpsJessica, at [facility].   Berdine DanceLauren Moffitt BSN, RN

## 2016-05-26 NOTE — Progress Notes (Addendum)
Clinical Social Worker facilitated patient discharge including contacting patient family and facility to confirm patient discharge plans.  Clinical information faxed to facility and family agreeable with plan.  CSW arranged ambulance transport via [facility] to [facility].  RN to call [phone] (pt will go in rm 118) report prior to discharge.  Clinical Social Worker will sign off for now as social work intervention is no longer needed. Please consult us again if new need arises.  Marrianne MoodAshley Sophiamarie Nease, MSW, [employer] [phone]

## 2016-05-27 LAB — CULTURE, BLOOD (ROUTINE X 2)
Culture: NO GROWTH
Culture: NO GROWTH
Special Requests: ADEQUATE
Special Requests: ADEQUATE

## 2016-05-27 LAB — BPAM RBC
BLOOD PRODUCT EXPIRATION DATE: 201805252359
BLOOD PRODUCT EXPIRATION DATE: 201805252359
Blood Product Expiration Date: 201805252359
Blood Product Expiration Date: 201805252359
ISSUE DATE / TIME: 201805100748
ISSUE DATE / TIME: 201805121253
ISSUE DATE / TIME: 201805121722
UNIT TYPE AND RH: 600
UNIT TYPE AND RH: 600
UNIT TYPE AND RH: 600
Unit Type and Rh: 600

## 2016-05-27 LAB — TYPE AND SCREEN
ABO/RH(D): A NEG
ANTIBODY SCREEN: NEGATIVE
UNIT DIVISION: 0
UNIT DIVISION: 0
Unit division: 0
Unit division: 0

## 2016-06-01 ENCOUNTER — Telehealth: Payer: Self-pay | Admitting: *Deleted

## 2016-06-01 NOTE — Telephone Encounter (Signed)
Patient verified DOB Patient is aware of Doppler being negative for DVT. No further questions at this time.

## 2016-06-01 NOTE — Telephone Encounter (Signed)
-----   Message from Jaclyn ShaggyEnobong Amao, MD sent at 05/27/2016 11:59 AM EDT ----- Dopplers is negative for DVT

## 2016-06-12 ENCOUNTER — Inpatient Hospital Stay (HOSPITAL_COMMUNITY)
Admission: AD | Admit: 2016-06-12 | Discharge: 2016-06-22 | DRG: 902 | Disposition: A | Discharge status: Home Health | Payer: BLUE CROSS/BLUE SHIELD | Source: Ambulatory Visit | Attending: Vascular Surgery | Admitting: Vascular Surgery

## 2016-06-12 ENCOUNTER — Encounter (HOSPITAL_COMMUNITY): Payer: Self-pay

## 2016-06-12 ENCOUNTER — Encounter: Payer: Self-pay | Admitting: Vascular Surgery

## 2016-06-12 ENCOUNTER — Ambulatory Visit (INDEPENDENT_AMBULATORY_CARE_PROVIDER_SITE_OTHER): Payer: Self-pay | Admitting: Vascular Surgery

## 2016-06-12 VITALS — BP 97/51 | HR 71 | Temp 100.7°F | Resp 14 | Ht 62.0 in | Wt 132.0 lb

## 2016-06-12 DIAGNOSIS — I2510 Atherosclerotic heart disease of native coronary artery without angina pectoris: Secondary | ICD-10-CM | POA: Diagnosis present

## 2016-06-12 DIAGNOSIS — E1151 Type 2 diabetes mellitus with diabetic peripheral angiopathy without gangrene: Secondary | ICD-10-CM | POA: Diagnosis not present

## 2016-06-12 DIAGNOSIS — T148XXA Other injury of unspecified body region, initial encounter: Secondary | ICD-10-CM

## 2016-06-12 DIAGNOSIS — L089 Local infection of the skin and subcutaneous tissue, unspecified: Secondary | ICD-10-CM | POA: Diagnosis not present

## 2016-06-12 DIAGNOSIS — L7682 Other postprocedural complications of skin and subcutaneous tissue: Secondary | ICD-10-CM | POA: Diagnosis present

## 2016-06-12 DIAGNOSIS — M797 Fibromyalgia: Secondary | ICD-10-CM | POA: Diagnosis present

## 2016-06-12 DIAGNOSIS — J45909 Unspecified asthma, uncomplicated: Secondary | ICD-10-CM | POA: Diagnosis present

## 2016-06-12 DIAGNOSIS — Z7982 Long term (current) use of aspirin: Secondary | ICD-10-CM | POA: Diagnosis not present

## 2016-06-12 DIAGNOSIS — I739 Peripheral vascular disease, unspecified: Secondary | ICD-10-CM

## 2016-06-12 DIAGNOSIS — I25700 Atherosclerosis of coronary artery bypass graft(s), unspecified, with unstable angina pectoris: Secondary | ICD-10-CM | POA: Diagnosis not present

## 2016-06-12 DIAGNOSIS — Z79899 Other long term (current) drug therapy: Secondary | ICD-10-CM | POA: Diagnosis not present

## 2016-06-12 DIAGNOSIS — Z87891 Personal history of nicotine dependence: Secondary | ICD-10-CM | POA: Diagnosis not present

## 2016-06-12 DIAGNOSIS — I10 Essential (primary) hypertension: Secondary | ICD-10-CM | POA: Diagnosis present

## 2016-06-12 DIAGNOSIS — E7800 Pure hypercholesterolemia, unspecified: Secondary | ICD-10-CM | POA: Diagnosis present

## 2016-06-12 DIAGNOSIS — R031 Nonspecific low blood-pressure reading: Secondary | ICD-10-CM | POA: Diagnosis present

## 2016-06-12 DIAGNOSIS — Z794 Long term (current) use of insulin: Secondary | ICD-10-CM

## 2016-06-12 DIAGNOSIS — R509 Fever, unspecified: Secondary | ICD-10-CM | POA: Diagnosis present

## 2016-06-12 DIAGNOSIS — K5900 Constipation, unspecified: Secondary | ICD-10-CM | POA: Diagnosis not present

## 2016-06-12 DIAGNOSIS — E1140 Type 2 diabetes mellitus with diabetic neuropathy, unspecified: Secondary | ICD-10-CM | POA: Diagnosis present

## 2016-06-12 DIAGNOSIS — D62 Acute posthemorrhagic anemia: Secondary | ICD-10-CM | POA: Diagnosis not present

## 2016-06-12 DIAGNOSIS — Y832 Surgical operation with anastomosis, bypass or graft as the cause of abnormal reaction of the patient, or of later complication, without mention of misadventure at the time of the procedure: Secondary | ICD-10-CM | POA: Diagnosis present

## 2016-06-12 DIAGNOSIS — I9789 Other postprocedural complications and disorders of the circulatory system, not elsewhere classified: Secondary | ICD-10-CM | POA: Diagnosis not present

## 2016-06-12 DIAGNOSIS — R9431 Abnormal electrocardiogram [ECG] [EKG]: Secondary | ICD-10-CM | POA: Diagnosis not present

## 2016-06-12 DIAGNOSIS — K219 Gastro-esophageal reflux disease without esophagitis: Secondary | ICD-10-CM | POA: Diagnosis present

## 2016-06-12 DIAGNOSIS — E1152 Type 2 diabetes mellitus with diabetic peripheral angiopathy with gangrene: Secondary | ICD-10-CM | POA: Diagnosis present

## 2016-06-12 DIAGNOSIS — Z7902 Long term (current) use of antithrombotics/antiplatelets: Secondary | ICD-10-CM

## 2016-06-12 DIAGNOSIS — Z951 Presence of aortocoronary bypass graft: Secondary | ICD-10-CM | POA: Diagnosis not present

## 2016-06-12 DIAGNOSIS — I25810 Atherosclerosis of coronary artery bypass graft(s) without angina pectoris: Secondary | ICD-10-CM | POA: Diagnosis present

## 2016-06-12 DIAGNOSIS — T8189XD Other complications of procedures, not elsewhere classified, subsequent encounter: Secondary | ICD-10-CM

## 2016-06-12 LAB — CBC
HEMATOCRIT: 30.4 % — AB (ref 36.0–46.0)
HEMOGLOBIN: 9.5 g/dL — AB (ref 12.0–15.0)
MCH: 29.1 pg (ref 26.0–34.0)
MCHC: 31.3 g/dL (ref 30.0–36.0)
MCV: 93 fL (ref 78.0–100.0)
Platelets: 344 10*3/uL (ref 150–400)
RBC: 3.27 MIL/uL — ABNORMAL LOW (ref 3.87–5.11)
RDW: 13.8 % (ref 11.5–15.5)
WBC: 13.7 10*3/uL — ABNORMAL HIGH (ref 4.0–10.5)

## 2016-06-12 LAB — COMPREHENSIVE METABOLIC PANEL
ALT: 10 U/L — ABNORMAL LOW (ref 14–54)
AST: 14 U/L — ABNORMAL LOW (ref 15–41)
Albumin: 2.9 g/dL — ABNORMAL LOW (ref 3.5–5.0)
Alkaline Phosphatase: 77 U/L (ref 38–126)
Anion gap: 11 (ref 5–15)
BILIRUBIN TOTAL: 0.6 mg/dL (ref 0.3–1.2)
BUN: 48 mg/dL — ABNORMAL HIGH (ref 6–20)
CO2: 21 mmol/L — ABNORMAL LOW (ref 22–32)
Calcium: 8.7 mg/dL — ABNORMAL LOW (ref 8.9–10.3)
Chloride: 106 mmol/L (ref 101–111)
Creatinine, Ser: 1.26 mg/dL — ABNORMAL HIGH (ref 0.44–1.00)
GFR, EST AFRICAN AMERICAN: 51 mL/min — AB (ref >60)
GFR, EST NON AFRICAN AMERICAN: 44 mL/min — AB (ref >60)
GLUCOSE: 147 mg/dL — AB (ref 65–99)
POTASSIUM: 4.5 mmol/L (ref 3.5–5.1)
Sodium: 138 mmol/L (ref 135–145)
TOTAL PROTEIN: 6.7 g/dL (ref 6.5–8.1)

## 2016-06-12 LAB — MRSA PCR SCREENING: MRSA by PCR: NEGATIVE

## 2016-06-12 LAB — PROTIME-INR
INR: 1.1
PROTHROMBIN TIME: 14.3 s (ref 11.4–15.2)

## 2016-06-12 LAB — GLUCOSE, CAPILLARY
GLUCOSE-CAPILLARY: 169 mg/dL — AB (ref 65–99)
Glucose-Capillary: 124 mg/dL — ABNORMAL HIGH (ref 65–99)

## 2016-06-12 MED ORDER — HYDRALAZINE HCL 20 MG/ML IJ SOLN
5.0000 mg | INTRAMUSCULAR | Status: DC | PRN
  Administered 2016-06-15: 5 mg via INTRAVENOUS
  Filled 2016-06-12: qty 1

## 2016-06-12 MED ORDER — POTASSIUM CHLORIDE CRYS ER 20 MEQ PO TBCR
20.0000 meq | EXTENDED_RELEASE_TABLET | Freq: Once | ORAL | Status: DC
  Filled 2016-06-12: qty 2

## 2016-06-12 MED ORDER — GUAIFENESIN-DM 100-10 MG/5ML PO SYRP: 15.0000 mL | ORAL_SOLUTION | ORAL | Status: DC | PRN

## 2016-06-12 MED ORDER — OXYCODONE-ACETAMINOPHEN 5-325 MG PO TABS
1.0000 | ORAL_TABLET | ORAL | Status: DC | PRN
  Administered 2016-06-12 – 2016-06-20 (×22): 2 via ORAL
  Filled 2016-06-12 (×24): qty 2

## 2016-06-12 MED ORDER — PIPERACILLIN-TAZOBACTAM 3.375 G IVPB
3.3750 g | Freq: Three times a day (TID) | INTRAVENOUS | Status: AC
  Administered 2016-06-12 – 2016-06-21 (×27): 3.375 g via INTRAVENOUS
  Filled 2016-06-12 (×31): qty 50

## 2016-06-12 MED ORDER — BISACODYL 10 MG RE SUPP
10.0000 mg | Freq: Every day | RECTAL | Status: DC | PRN
  Administered 2016-06-20: 10 mg via RECTAL
  Filled 2016-06-12: qty 1

## 2016-06-12 MED ORDER — CLOPIDOGREL BISULFATE 75 MG PO TABS
75.0000 mg | ORAL_TABLET | Freq: Every day | ORAL | Status: DC
  Administered 2016-06-13 – 2016-06-22 (×10): 75 mg via ORAL
  Filled 2016-06-12 (×10): qty 1

## 2016-06-12 MED ORDER — ASPIRIN EC 81 MG PO TBEC
81.0000 mg | DELAYED_RELEASE_TABLET | Freq: Every day | ORAL | Status: DC
  Administered 2016-06-13 – 2016-06-22 (×10): 81 mg via ORAL
  Filled 2016-06-12 (×9): qty 1

## 2016-06-12 MED ORDER — METOPROLOL TARTRATE 5 MG/5ML IV SOLN: 2.0000 mg | INTRAVENOUS | Status: DC | PRN

## 2016-06-12 MED ORDER — ONDANSETRON HCL 4 MG/2ML IJ SOLN
4.0000 mg | Freq: Four times a day (QID) | INTRAMUSCULAR | Status: DC | PRN
  Administered 2016-06-15: 4 mg via INTRAVENOUS
  Filled 2016-06-12: qty 2

## 2016-06-12 MED ORDER — PANTOPRAZOLE SODIUM 40 MG PO TBEC
40.0000 mg | DELAYED_RELEASE_TABLET | Freq: Every day | ORAL | Status: DC
  Administered 2016-06-13 – 2016-06-22 (×10): 40 mg via ORAL
  Filled 2016-06-12 (×9): qty 1

## 2016-06-12 MED ORDER — LABETALOL HCL 5 MG/ML IV SOLN
10.0000 mg | INTRAVENOUS | Status: DC | PRN
  Administered 2016-06-17: 10 mg via INTRAVENOUS
  Filled 2016-06-12: qty 4

## 2016-06-12 MED ORDER — METOPROLOL TARTRATE 12.5 MG HALF TABLET
12.5000 mg | ORAL_TABLET | Freq: Two times a day (BID) | ORAL | Status: DC
  Administered 2016-06-13 – 2016-06-22 (×18): 12.5 mg via ORAL
  Filled 2016-06-12 (×19): qty 1

## 2016-06-12 MED ORDER — LISINOPRIL 10 MG PO TABS
10.0000 mg | ORAL_TABLET | Freq: Every day | ORAL | Status: DC
  Administered 2016-06-13 – 2016-06-22 (×9): 10 mg via ORAL
  Filled 2016-06-12 (×10): qty 1

## 2016-06-12 MED ORDER — MOMETASONE FURO-FORMOTEROL FUM 100-5 MCG/ACT IN AERO
2.0000 | INHALATION_SPRAY | Freq: Two times a day (BID) | RESPIRATORY_TRACT | Status: DC
Start: 2016-06-12 — End: 2016-06-22
  Administered 2016-06-13 – 2016-06-22 (×13): 2 via RESPIRATORY_TRACT
  Filled 2016-06-12 (×4): qty 8.8

## 2016-06-12 MED ORDER — ALBUTEROL SULFATE (2.5 MG/3ML) 0.083% IN NEBU: 2.5000 mg | INHALATION_SOLUTION | Freq: Four times a day (QID) | RESPIRATORY_TRACT | Status: DC | PRN

## 2016-06-12 MED ORDER — ALUM & MAG HYDROXIDE-SIMETH 200-200-20 MG/5ML PO SUSP: 15.0000 mL | ORAL | Status: DC | PRN

## 2016-06-12 MED ORDER — INSULIN ASPART 100 UNIT/ML ~~LOC~~ SOLN
0.0000 [IU] | Freq: Three times a day (TID) | SUBCUTANEOUS | Status: DC
  Administered 2016-06-13: 1 [IU] via SUBCUTANEOUS
  Administered 2016-06-13: 2 [IU] via SUBCUTANEOUS
  Administered 2016-06-13 – 2016-06-17 (×3): 1 [IU] via SUBCUTANEOUS
  Administered 2016-06-18 – 2016-06-20 (×2): 2 [IU] via SUBCUTANEOUS
  Administered 2016-06-20: 1 [IU] via SUBCUTANEOUS
  Administered 2016-06-21: 2 [IU] via SUBCUTANEOUS
  Administered 2016-06-21: 1 [IU] via SUBCUTANEOUS
  Administered 2016-06-22: 2 [IU] via SUBCUTANEOUS

## 2016-06-12 MED ORDER — HYDROCHLOROTHIAZIDE 25 MG PO TABS
25.0000 mg | ORAL_TABLET | Freq: Every day | ORAL | Status: DC
  Administered 2016-06-13 – 2016-06-22 (×10): 25 mg via ORAL
  Filled 2016-06-12 (×9): qty 1

## 2016-06-12 MED ORDER — VANCOMYCIN HCL 10 G IV SOLR
1500.0000 mg | INTRAVENOUS | Status: DC
  Filled 2016-06-12: qty 1500

## 2016-06-12 MED ORDER — SODIUM CHLORIDE 0.9 % IV SOLN
INTRAVENOUS | Status: DC
  Administered 2016-06-12 – 2016-06-19 (×5): via INTRAVENOUS

## 2016-06-12 MED ORDER — GABAPENTIN 300 MG PO CAPS
600.0000 mg | ORAL_CAPSULE | Freq: Three times a day (TID) | ORAL | Status: DC
Start: 2016-06-12 — End: 2016-06-22
  Administered 2016-06-12 – 2016-06-22 (×29): 600 mg via ORAL
  Filled 2016-06-12 (×28): qty 2

## 2016-06-12 MED ORDER — VANCOMYCIN HCL IN DEXTROSE 1-5 GM/200ML-% IV SOLN
1000.0000 mg | INTRAVENOUS | Status: AC
  Administered 2016-06-13 – 2016-06-21 (×9): 1000 mg via INTRAVENOUS
  Filled 2016-06-12 (×10): qty 200

## 2016-06-12 MED ORDER — INSULIN GLARGINE 100 UNIT/ML ~~LOC~~ SOLN
30.0000 [IU] | Freq: Every day | SUBCUTANEOUS | Status: DC
  Administered 2016-06-12 – 2016-06-21 (×10): 30 [IU] via SUBCUTANEOUS
  Filled 2016-06-12 (×12): qty 0.3

## 2016-06-12 MED ORDER — SODIUM CHLORIDE 0.9 % IV SOLN
1250.0000 mg | Freq: Once | INTRAVENOUS | Status: DC
  Administered 2016-06-12: 1250 mg via INTRAVENOUS
  Filled 2016-06-12: qty 1250

## 2016-06-12 MED ORDER — ACETAMINOPHEN 325 MG RE SUPP: 325.0000 mg | RECTAL | Status: DC | PRN

## 2016-06-12 MED ORDER — PHENOL 1.4 % MT LIQD: 1.0000 | OROMUCOSAL | Status: DC | PRN

## 2016-06-12 MED ORDER — ATORVASTATIN CALCIUM 80 MG PO TABS
80.0000 mg | ORAL_TABLET | Freq: Every day | ORAL | Status: DC
  Administered 2016-06-12 – 2016-06-21 (×9): 80 mg via ORAL
  Filled 2016-06-12 (×9): qty 1

## 2016-06-12 MED ORDER — MORPHINE SULFATE (PF) 2 MG/ML IV SOLN
2.0000 mg | INTRAVENOUS | Status: DC | PRN
  Administered 2016-06-12 – 2016-06-19 (×6): 2 mg via INTRAVENOUS
  Filled 2016-06-12 (×6): qty 1

## 2016-06-12 MED ORDER — METFORMIN HCL 500 MG PO TABS
1000.0000 mg | ORAL_TABLET | Freq: Two times a day (BID) | ORAL | Status: DC
  Administered 2016-06-13 – 2016-06-22 (×18): 1000 mg via ORAL
  Filled 2016-06-12 (×18): qty 2

## 2016-06-12 MED ORDER — ACETAMINOPHEN 325 MG PO TABS: 325.0000 mg | ORAL_TABLET | ORAL | Status: DC | PRN

## 2016-06-12 MED ORDER — HEPARIN SODIUM (PORCINE) 5000 UNIT/ML IJ SOLN
5000.0000 [IU] | Freq: Three times a day (TID) | INTRAMUSCULAR | Status: DC
  Administered 2016-06-12 – 2016-06-22 (×27): 5000 [IU] via SUBCUTANEOUS
  Filled 2016-06-12 (×28): qty 1

## 2016-06-12 MED ORDER — OXYBUTYNIN CHLORIDE 5 MG PO TABS
5.0000 mg | ORAL_TABLET | Freq: Two times a day (BID) | ORAL | Status: DC
Start: 2016-06-12 — End: 2016-06-22
  Administered 2016-06-12 – 2016-06-22 (×20): 5 mg via ORAL
  Filled 2016-06-12 (×21): qty 1

## 2016-06-12 MED ORDER — VANCOMYCIN HCL 10 G IV SOLR
1250.0000 mg | Freq: Once | INTRAVENOUS | Status: AC
  Filled 2016-06-12: qty 1250

## 2016-06-12 MED ORDER — POLYETHYLENE GLYCOL 3350 17 G PO PACK
17.0000 g | PACK | Freq: Every day | ORAL | Status: DC | PRN
  Administered 2016-06-20: 17 g via ORAL
  Filled 2016-06-12: qty 1

## 2016-06-12 MED ORDER — PIPERACILLIN-TAZOBACTAM 3.375 G IVPB
3.3750 g | Freq: Three times a day (TID) | INTRAVENOUS | Status: DC
  Filled 2016-06-12 (×2): qty 50

## 2016-06-12 MED FILL — OXYBUTYNIN 5 MG TABLET: 5 | 30 days supply | Qty: 60 | Fill #0

## 2016-06-12 MED FILL — CYCLOBENZAPRINE 5 MG TABLET: 5 | 7 days supply | Qty: 30 | Fill #0

## 2016-06-12 MED FILL — ADVAIR 100/50 DISKUS: 100-50 | 30 days supply | Qty: 60 | Fill #0

## 2016-06-12 MED FILL — LISINOPRIL 10 MG TABLET: 10 | 30 days supply | Qty: 30 | Fill #0

## 2016-06-12 MED FILL — GABAPENTIN 300 MG CAPSULE: 300 | 30 days supply | Qty: 180 | Fill #0

## 2016-06-12 MED FILL — metFORMIN HCL 1000 MG TABS: 1000 | 30 days supply | Qty: 60 | Fill #0

## 2016-06-12 MED FILL — HYDROCHLOROTHIAZIDE 25 MG T: 25 | 30 days supply | Qty: 30 | Fill #0

## 2016-06-12 MED FILL — TRUEPLUS PEN NDL 31GX3/16": 31G X 5 MM | 30 days supply | Qty: 100 | Fill #0

## 2016-06-12 MED FILL — PANTOPRAZOLE SOD DR 20 MG T: 20 | 30 days supply | Qty: 30 | Fill #0

## 2016-06-12 MED FILL — EARWAX TREATMENT 6.5% DROPS: 6.5 | 30 days supply | Qty: 15 | Fill #0

## 2016-06-12 MED FILL — LANTUS SOLOSTAR 100 UNITS/M: 100 | 30 days supply | Qty: 9 | Fill #0

## 2016-06-12 MED FILL — TRUEPLUS PEN NDL 31GX3/16: 31G X 5 MM | 30 days supply | Qty: 100 | Fill #0

## 2016-06-12 MED FILL — VENTOLIN HFA 90 MCG INHALER: 108 (90 BAS | 25 days supply | Qty: 18 | Fill #0

## 2016-06-12 MED FILL — ATORVASTATIN 80 MG TABLET: 80 | 30 days supply | Qty: 30 | Fill #0

## 2016-06-12 NOTE — Progress Notes (Signed)
Positive doppler on patient's right foot. RN unable to obtain doppler pulses on patient's left foot. Both feet warm to touch. Bottom of great left toe has dark discoloration, there are pinpoint dark spots on several of patient's other toes. A second RN attempted to get doppler pulses on left foot and was unable to. RN paged Dr Darrick PennaFields, on call with vascular surgery. He stated he would come to assess the patient.

## 2016-06-12 NOTE — Progress Notes (Signed)
Patient ID: Andrea [name], female   DOB: Nov 06, 1950, 66 y.o.   MRN: [id number]  Reason for Consult: Re-evaluation (right groin wound)   Referred by Arnoldo Morale, MD  Subjective:     HPI:  Andrea [name] is a 66 y.o. female follows up from recent right femoral endarterectomy and femoral to below-knee popliteal artery bypass with graft. In the immediate postoperative period she had fevers with unexplained origin. Her wounds on her right foot has continued to heal although she now has low-grade fevers again and is having wound breakdown her right groin. This was only recently noticed by her rehabilitation facility. She is actually discharged by rehabilitation facility today. She is not feeling systemically ill having tolerated diet up until now. She is not having chest pain. She does have progressive ischemia of her left great toe..  Past Medical History:  Diagnosis Date  . Asthma   . Coronary artery disease   . Diabetes mellitus without complication ([place])   . Dyspnea   . Fibromyalgia   . GERD (gastroesophageal reflux disease)   . High cholesterol   . Hypertension   . PONV (postoperative nausea and vomiting)   . PVD (peripheral vascular disease) ([place])   . Urinary frequency   . Urinary incontinence    Family History  Problem Relation Age of Onset  . Stroke Mother    Past Surgical History:  Procedure Laterality Date  . ABDOMINAL AORTOGRAM W/LOWER EXTREMITY N/A 03/25/2016   Procedure: Abdominal Aortogram w/Lower Extremity;  Surgeon: Waynetta Sandy, MD;  Location: [place] [facility];  Service: Cardiovascular;  Laterality: N/A;  . CORONARY ARTERY BYPASS GRAFT    . CORONARY STENT INTERVENTION N/A 04/15/2016   Procedure: Coronary Stent Intervention;  Surgeon: Troy Sine, MD;  Location: [place] [facility];  Service: Cardiovascular;  Laterality: N/A;  . ENDARTERECTOMY FEMORAL Right 05/21/2016   Procedure: RIGHT FEMORAL ENDARTERECTOMY;  Surgeon: Waynetta Sandy,  MD;  Location: [place];  Service: Vascular;  Laterality: Right;  . FEMORAL-POPLITEAL BYPASS GRAFT Right 05/21/2016   Procedure: RIGHT FEMORAL-BELOW KNEE POPLITEAL ARTERY BYPASS GRAFT USING PROPATEN GORETEX GRAFT;  Surgeon: Waynetta Sandy, MD;  Location: [place];  Service: Vascular;  Laterality: Right;  . KNEE SURGERY    . LEFT HEART CATH AND CORS/GRAFTS ANGIOGRAPHY N/A 04/15/2016   Procedure: Left Heart Cath and Cors/Grafts Angiography;  Surgeon: Troy Sine, MD;  Location: [place] [facility];  Service: Cardiovascular;  Laterality: N/A;  . MULTIPLE TOOTH EXTRACTIONS      Short Social History:  Social History  Substance Use Topics  . Smoking status: Former Smoker    Packs/day: 0.25    Types: Cigarettes    Quit date: 04/04/2016  . Smokeless tobacco: Never Used  . Alcohol use No    Allergies  Allergen Reactions  . Naproxen Itching  . Tramadol Hcl Itching    Current Outpatient Prescriptions  Medication Sig Dispense Refill  . albuterol (PROVENTIL HFA;VENTOLIN HFA) 108 (90 Base) MCG/ACT inhaler Inhale 2 puffs into the lungs every 6 (six) hours as needed for wheezing or shortness of breath. 54 Inhaler 3  . aspirin EC 81 MG tablet Take 1 tablet (81 mg total) by mouth daily. 90 tablet 3  . atorvastatin (LIPITOR) 80 MG tablet Take 1 tablet (80 mg total) by mouth daily. 90 tablet 3  . Blood Glucose Monitoring Suppl (TRUE METRIX GO GLUCOSE METER) w/Device KIT 1 each by Does not apply route every 8 (eight) hours as  needed. (Patient taking differently: 1 each by Does not apply route daily. ) 1 kit 0  . carbamide peroxide (DEBROX) 6.5 % otic solution Place 5 drops into both ears daily as needed (WAX BUILDUP).     . clopidogrel (PLAVIX) 75 MG tablet Take 1 tablet (75 mg total) by mouth daily with breakfast. 90 tablet 3  . Fluticasone-Salmeterol (ADVAIR) 100-50 MCG/DOSE AEPB Inhale 1 puff into the lungs 2 (two) times daily. 180 each 3  . gabapentin (NEURONTIN) 300 MG capsule Take 600 mg by  mouth 3 (three) times daily.   3  . glucose blood (TRUE METRIX BLOOD GLUCOSE TEST) test strip Use as instructed (Patient taking differently: 1 each by Other route daily. Use as instructed) 100 each 12  . hydrochlorothiazide (HYDRODIURIL) 25 MG tablet Take 1 tablet (25 mg total) by mouth daily. 90 tablet 3  . Insulin Glargine (LANTUS SOLOSTAR) 100 UNIT/ML Solostar Pen Inject 30 Units into the skin daily at 10 pm. 30 mL 3  . Insulin Pen Needle (ULTICARE MICRO PEN NEEDLES) 32G X 4 MM MISC 1 applicator by Does not apply route at bedtime. (Patient taking differently: 1 applicator by Does not apply route daily. ) 100 each 11  . lisinopril (PRINIVIL,ZESTRIL) 10 MG tablet Take 1 tablet (10 mg total) by mouth daily. 90 tablet 3  . metFORMIN (GLUCOPHAGE) 1000 MG tablet Take 1 tablet (1,000 mg total) by mouth 2 (two) times daily. Restart 48 hrs after discharge. 60 tablet 3  . metoprolol tartrate (LOPRESSOR) 25 MG tablet Take 0.5 tablets (12.5 mg total) by mouth 2 (two) times daily. 90 tablet 3  . omeprazole (PRILOSEC) 20 MG capsule TAKE 1 CAPSULE BY MOUTH DAILY. 30 capsule 3  . oxybutynin (DITROPAN) 5 MG tablet TAKE 1 TABLET BY MOUTH 2 TIMES DAILY. 60 tablet 2  . oxyCODONE-acetaminophen (PERCOCET/ROXICET) 5-325 MG tablet Take 1-2 tablets by mouth every 4 (four) hours as needed for moderate pain. 30 tablet 0  . TRUEPLUS LANCETS 26G MISC 1 each by Does not apply route every 8 (eight) hours as needed. (Patient taking differently: 1 each by Other route daily. ) 100 each 12   No current facility-administered medications for this visit.     Review of Systems  Constitutional: Positive for fatigue and fever.  Skin: Positive for wound.        Objective:  Objective   Vitals:   06/12/16 1504  BP: (!) 97/51  Pulse: 71  Resp: 14  Temp: (!) 100.7 F (38.2 C)  SpO2: 92%  Weight: 132 lb (59.9 kg)  Height: 5' 2"  (1.575 m)   Body mass index is 24.14 kg/m.  Physical Exam  Constitutional: She appears  well-developed.  HENT:  Head: Normocephalic.  Eyes: Pupils are equal, round, and reactive to light.  Neck: Normal range of motion.  Cardiovascular: Normal rate.   Strong signal pt Weak signal left AT  Pulmonary/Chest: Effort normal.  Abdominal: Soft.  Skin:  Right groin has fibrinous exudate and black discoloration of skin edges  Psychiatric: She has a normal mood and affect. Her behavior is normal. Judgment and thought content normal.        Assessment/Plan:     66 year old female with severely depressed ABIs bilaterally known bilateral SFA occlusions reason underwent right femoral to below-knee popliteal artery bypass with graft. In the immediate postoperative period she had fevers that resolved. Her wounds looked good at discharge. She has now been in rehabilitation and has breakdown of her right groin. We will  admit her for IV antibiotics today and she will need debridement of his groin and I have discussed likely need for excision of the graft which would need to be done next week when I am not here. She should be a candidate for revascularization from an endovascular approach on the right now that she has undergone femoral and SFA endarterectomy. Unfortunately the left side now has progressive ischemia of her great toe and she does not have an endovascular approach on that side. She is tearful on exam but appropriate. We will get her admitted today for IV antibiotics and I have discussed with the on-call physician Dr. Oneida Alar.     Waynetta Sandy MD [facility]

## 2016-06-12 NOTE — Progress Notes (Signed)
Called to see pt for no doppler signal left foot.  Pt has known ischemia of this side recent ABI 0.2.  Faint AT signal per Dr Darcella Cheshireain's last note  Foot is pink.  Baseline rest pain Right groin dark eschar  Discussed with pt high risk of limb loss but no plans for left leg intervention while current ongoing right leg infection.  Will plan I D of right groin Sunday or Monday  Fabienne Brunsharles Zelene Barga, MD [facility] [place] Office: [phone] Pager: [phone]

## 2016-06-12 NOTE — H&P (Signed)
Patient ID: Andrea [name], female   DOB: 13-Nov-1950, 66 y.o.   MRN: [id number]  Reason for Consult: Re-evaluation (right groin wound)   Referred by Arnoldo Morale, MD  Subjective:     HPI:  Andrea [name] is a 66 y.o. female follows up from recent right femoral endarterectomy and femoral to below-knee popliteal artery bypass with graft. In the immediate postoperative period she had fevers with unexplained origin. Her wounds on her right foot has continued to heal although she now has low-grade fevers again and is having wound breakdown her right groin. This was only recently noticed by her rehabilitation facility. She is actually discharged by rehabilitation facility today. She is not feeling systemically ill having tolerated diet up until now. She is not having chest pain. She does have progressive ischemia of her left great toe..  Past Medical History:  Diagnosis Date  . Asthma   . Coronary artery disease   . Diabetes mellitus without complication ([place])   . Dyspnea   . Fibromyalgia   . GERD (gastroesophageal reflux disease)   . High cholesterol   . Hypertension   . PONV (postoperative nausea and vomiting)   . PVD (peripheral vascular disease) ([place])   . Urinary frequency   . Urinary incontinence         Family History  Problem Relation Age of Onset  . Stroke Mother         Past Surgical History:  Procedure Laterality Date  . ABDOMINAL AORTOGRAM W/LOWER EXTREMITY N/A 03/25/2016   Procedure: Abdominal Aortogram w/Lower Extremity;  Surgeon: Waynetta Sandy, MD;  Location: [place] [facility];  Service: Cardiovascular;  Laterality: N/A;  . CORONARY ARTERY BYPASS GRAFT    . CORONARY STENT INTERVENTION N/A 04/15/2016   Procedure: Coronary Stent Intervention;  Surgeon: Troy Sine, MD;  Location: [place] [facility];  Service: Cardiovascular;  Laterality: N/A;  . ENDARTERECTOMY FEMORAL Right 05/21/2016   Procedure: RIGHT FEMORAL ENDARTERECTOMY;   Surgeon: Waynetta Sandy, MD;  Location: [place];  Service: Vascular;  Laterality: Right;  . FEMORAL-POPLITEAL BYPASS GRAFT Right 05/21/2016   Procedure: RIGHT FEMORAL-BELOW KNEE POPLITEAL ARTERY BYPASS GRAFT USING PROPATEN GORETEX GRAFT;  Surgeon: Waynetta Sandy, MD;  Location: [place];  Service: Vascular;  Laterality: Right;  . KNEE SURGERY    . LEFT HEART CATH AND CORS/GRAFTS ANGIOGRAPHY N/A 04/15/2016   Procedure: Left Heart Cath and Cors/Grafts Angiography;  Surgeon: Troy Sine, MD;  Location: [place] [facility];  Service: Cardiovascular;  Laterality: N/A;  . MULTIPLE TOOTH EXTRACTIONS      Short Social History:       Social History  Substance Use Topics  . Smoking status: Former Smoker    Packs/day: 0.25    Types: Cigarettes    Quit date: 04/04/2016  . Smokeless tobacco: Never Used  . Alcohol use No        Allergies  Allergen Reactions  . Naproxen Itching  . Tramadol Hcl Itching          Current Outpatient Prescriptions  Medication Sig Dispense Refill  . albuterol (PROVENTIL HFA;VENTOLIN HFA) 108 (90 Base) MCG/ACT inhaler Inhale 2 puffs into the lungs every 6 (six) hours as needed for wheezing or shortness of breath. 54 Inhaler 3  . aspirin EC 81 MG tablet Take 1 tablet (81 mg total) by mouth daily. 90 tablet 3  . atorvastatin (LIPITOR) 80 MG tablet Take 1 tablet (80 mg total) by mouth daily. 90 tablet 3  .  Blood Glucose Monitoring Suppl (TRUE METRIX GO GLUCOSE METER) w/Device KIT 1 each by Does not apply route every 8 (eight) hours as needed. (Patient taking differently: 1 each by Does not apply route daily. ) 1 kit 0  . carbamide peroxide (DEBROX) 6.5 % otic solution Place 5 drops into both ears daily as needed (WAX BUILDUP).     . clopidogrel (PLAVIX) 75 MG tablet Take 1 tablet (75 mg total) by mouth daily with breakfast. 90 tablet 3  . Fluticasone-Salmeterol (ADVAIR) 100-50 MCG/DOSE AEPB Inhale 1 puff into the lungs 2 (two) times  daily. 180 each 3  . gabapentin (NEURONTIN) 300 MG capsule Take 600 mg by mouth 3 (three) times daily.   3  . glucose blood (TRUE METRIX BLOOD GLUCOSE TEST) test strip Use as instructed (Patient taking differently: 1 each by Other route daily. Use as instructed) 100 each 12  . hydrochlorothiazide (HYDRODIURIL) 25 MG tablet Take 1 tablet (25 mg total) by mouth daily. 90 tablet 3  . Insulin Glargine (LANTUS SOLOSTAR) 100 UNIT/ML Solostar Pen Inject 30 Units into the skin daily at 10 pm. 30 mL 3  . Insulin Pen Needle (ULTICARE MICRO PEN NEEDLES) 32G X 4 MM MISC 1 applicator by Does not apply route at bedtime. (Patient taking differently: 1 applicator by Does not apply route daily. ) 100 each 11  . lisinopril (PRINIVIL,ZESTRIL) 10 MG tablet Take 1 tablet (10 mg total) by mouth daily. 90 tablet 3  . metFORMIN (GLUCOPHAGE) 1000 MG tablet Take 1 tablet (1,000 mg total) by mouth 2 (two) times daily. Restart 48 hrs after discharge. 60 tablet 3  . metoprolol tartrate (LOPRESSOR) 25 MG tablet Take 0.5 tablets (12.5 mg total) by mouth 2 (two) times daily. 90 tablet 3  . omeprazole (PRILOSEC) 20 MG capsule TAKE 1 CAPSULE BY MOUTH DAILY. 30 capsule 3  . oxybutynin (DITROPAN) 5 MG tablet TAKE 1 TABLET BY MOUTH 2 TIMES DAILY. 60 tablet 2  . oxyCODONE-acetaminophen (PERCOCET/ROXICET) 5-325 MG tablet Take 1-2 tablets by mouth every 4 (four) hours as needed for moderate pain. 30 tablet 0  . TRUEPLUS LANCETS 26G MISC 1 each by Does not apply route every 8 (eight) hours as needed. (Patient taking differently: 1 each by Other route daily. ) 100 each 12   No current facility-administered medications for this visit.     Review of Systems  Constitutional: Positive for fatigue and fever.  Skin: Positive for wound.        Objective:  Objective      Vitals:   06/12/16 1504  BP: (!) 97/51  Pulse: 71  Resp: 14  Temp: (!) 100.7 F (38.2 C)  SpO2: 92%  Weight: 132 lb (59.9 kg)  Height: 5' 2"  (1.575 m)    Body mass index is 24.14 kg/m.  Physical Exam  Constitutional: She appears well-developed.  HENT:  Head: Normocephalic.  Eyes: Pupils are equal, round, and reactive to light.  Neck: Normal range of motion.  Cardiovascular: Normal rate.   Strong signal pt Weak signal left AT  Pulmonary/Chest: Effort normal.  Abdominal: Soft.  Skin:  Right groin has fibrinous exudate and black discoloration of skin edges  Psychiatric: She has a normal mood and affect. Her behavior is normal. Judgment and thought content normal.        Assessment/Plan:   66 year old female with severely depressed ABIs bilaterally known bilateral SFA occlusions reason underwent right femoral to below-knee popliteal artery bypass with graft. In the immediate postoperative period she had  fevers that resolved. Her wounds looked good at discharge. She has now been in rehabilitation and has breakdown of her right groin. We will admit her for IV antibiotics today and she will need debridement of his groin and I have discussed likely need for excision of the graft which would need to be done next week when I am not here. She should be a candidate for revascularization from an endovascular approach on the right now that she has undergone femoral and SFA endarterectomy. Unfortunately the left side now has progressive ischemia of her great toe and she does not have an endovascular approach on that side. She is tearful on exam but appropriate. We will get her admitted today for IV antibiotics and I have discussed with the on-call physician Dr. Oneida Alar.   Waynetta Sandy MD [facility]

## 2016-06-12 NOTE — Progress Notes (Addendum)
Pharmacy Antibiotic Note  Andrea Brookingoni B Bhat is a 66 y.o. female admitted on 06/12/2016 with right groin wound after recent right femoral endarterectomy and femoral to below-knee popliteal artery bypass with graft.  Pharmacy has been consulted for vancomycin and Zosyn dosing. SCr was 0.74 on 05/24/16, est CrCl ~68 ml/min.  Plan: Vancomycin 1250 mg IV once then 1500 mg IV q24h, goal trough 10-15 mcg/ml Zosyn 3.375 g IV q8h to be infused over 4 hours Monitor renal function, clinical progress, and culture data  F/U pending labs and adjust dosing if needed  Addendum: SCr is 1.26, est CrCl ~36-42 ml/min. Change vancomycin to 1000 mg IV q24h Zosyn dosing remains appropriate     Temp (24hrs), Avg:100.7 F (38.2 C), Min:100.7 F (38.2 C), Max:100.7 F (38.2 C)  No results for input(s): WBC, CREATININE, LATICACIDVEN, VANCOTROUGH, VANCOPEAK, VANCORANDOM, GENTTROUGH, GENTPEAK, GENTRANDOM, TOBRATROUGH, TOBRAPEAK, TOBRARND, AMIKACINPEAK, AMIKACINTROU, AMIKACIN in the last 168 hours.  Estimated Creatinine Clearance: 56.9 mL/min (by C-G formula based on SCr of 0.74 mg/dL).    Allergies  Allergen Reactions  . Naproxen Itching  . Tramadol Hcl Itching    Antimicrobials this admission: Vancomycin 6/1 >>  Zosyn 6/1 >>   Dose adjustments this admission: none  Microbiology results: None   Thank you for allowing pharmacy to be a part of this patient's care.   Loura BackJennifer LaFayette, PharmD, BCPS Clinical Pharmacist Phone for tonight [phone] Main pharmacy - [phone] 06/12/2016 4:52 PM

## 2016-06-13 LAB — CBC
HEMATOCRIT: 29.1 % — AB (ref 36.0–46.0)
HEMOGLOBIN: 9.1 g/dL — AB (ref 12.0–15.0)
MCH: 28.8 pg (ref 26.0–34.0)
MCHC: 31.3 g/dL (ref 30.0–36.0)
MCV: 92.1 fL (ref 78.0–100.0)
Platelets: 317 10*3/uL (ref 150–400)
RBC: 3.16 MIL/uL — AB (ref 3.87–5.11)
RDW: 13.5 % (ref 11.5–15.5)
WBC: 11 10*3/uL — ABNORMAL HIGH (ref 4.0–10.5)

## 2016-06-13 LAB — BASIC METABOLIC PANEL
ANION GAP: 11 (ref 5–15)
BUN: 43 mg/dL — ABNORMAL HIGH (ref 6–20)
CALCIUM: 8.4 mg/dL — AB (ref 8.9–10.3)
CHLORIDE: 105 mmol/L (ref 101–111)
CO2: 21 mmol/L — AB (ref 22–32)
Creatinine, Ser: 1.08 mg/dL — ABNORMAL HIGH (ref 0.44–1.00)
GFR calc non Af Amer: 53 mL/min — ABNORMAL LOW (ref >60)
Glucose, Bld: 144 mg/dL — ABNORMAL HIGH (ref 65–99)
POTASSIUM: 3.7 mmol/L (ref 3.5–5.1)
Sodium: 137 mmol/L (ref 135–145)

## 2016-06-13 LAB — URINALYSIS, ROUTINE W REFLEX MICROSCOPIC
Bilirubin Urine: NEGATIVE
Glucose, UA: NEGATIVE mg/dL
HGB URINE DIPSTICK: NEGATIVE
KETONES UR: NEGATIVE mg/dL
NITRITE: NEGATIVE
PH: 5 (ref 5.0–8.0)
PROTEIN: NEGATIVE mg/dL
Specific Gravity, Urine: 1.016 (ref 1.005–1.030)

## 2016-06-13 LAB — GLUCOSE, CAPILLARY
GLUCOSE-CAPILLARY: 144 mg/dL — AB (ref 65–99)
Glucose-Capillary: 141 mg/dL — ABNORMAL HIGH (ref 65–99)
Glucose-Capillary: 167 mg/dL — ABNORMAL HIGH (ref 65–99)
Glucose-Capillary: 162 mg/dL — ABNORMAL HIGH (ref 65–99)

## 2016-06-13 MED ORDER — DEXTROSE 5 % IV SOLN: 1.5000 g | INTRAVENOUS | Status: DC

## 2016-06-13 NOTE — Progress Notes (Signed)
[facility]  Subjective  - feels ok    Objective (!) 146/43 67 97.8 F (36.6 C) (Oral) 18 96%  Intake/Output Summary (Last 24 hours) at 06/13/16 1050 Last data filed at 06/13/16 0916  Gross per 24 hour  Intake              100 ml  Output             1350 ml  Net            -1250 ml   Right groin black eshar no real erythema   Assessment/Planning: Will debride groin in OR tomorrow NPO p midnight/consent D/w with pt that graft may need removal and she could lose her leg Continue antibiotics  Fields, Charles 06/13/2016 10:50 AM --  Laboratory Lab Results:  Recent Labs  06/12/16 1909 06/13/16 0310  WBC 13.7* 11.0*  HGB 9.5* 9.1*  HCT 30.4* 29.1*  PLT 344 317   BMET  Recent Labs  06/12/16 1909 06/13/16 0310  NA 138 137  K 4.5 3.7  CL 106 105  CO2 21* 21*  GLUCOSE 147* 144*  BUN 48* 43*  CREATININE 1.26* 1.08*  CALCIUM 8.7* 8.4*    COAG Lab Results  Component Value Date   INR 1.10 06/12/2016   INR 0.93 05/13/2016   INR 0.9 04/14/2016   No results found for: PTT     

## 2016-06-14 ENCOUNTER — Encounter (HOSPITAL_COMMUNITY)
Admission: AD | Disposition: A | Discharge status: Home Health | Payer: Self-pay | Source: Ambulatory Visit | Attending: Vascular Surgery

## 2016-06-14 ENCOUNTER — Inpatient Hospital Stay (HOSPITAL_COMMUNITY): Payer: BLUE CROSS/BLUE SHIELD | Admitting: Certified Registered Nurse Anesthetist

## 2016-06-14 ENCOUNTER — Encounter (HOSPITAL_COMMUNITY): Payer: Self-pay | Admitting: Certified Registered Nurse Anesthetist

## 2016-06-14 DIAGNOSIS — I739 Peripheral vascular disease, unspecified: Secondary | ICD-10-CM

## 2016-06-14 DIAGNOSIS — I2510 Atherosclerotic heart disease of native coronary artery without angina pectoris: Secondary | ICD-10-CM

## 2016-06-14 DIAGNOSIS — R9431 Abnormal electrocardiogram [ECG] [EKG]: Secondary | ICD-10-CM

## 2016-06-14 DIAGNOSIS — Z951 Presence of aortocoronary bypass graft: Secondary | ICD-10-CM

## 2016-06-14 DIAGNOSIS — E1151 Type 2 diabetes mellitus with diabetic peripheral angiopathy without gangrene: Secondary | ICD-10-CM

## 2016-06-14 DIAGNOSIS — L089 Local infection of the skin and subcutaneous tissue, unspecified: Secondary | ICD-10-CM

## 2016-06-14 HISTORY — PX: GROIN DEBRIDEMENT: SHX5159

## 2016-06-14 HISTORY — PX: APPLICATION OF WOUND VAC: SHX5189

## 2016-06-14 LAB — BASIC METABOLIC PANEL
ANION GAP: 12 (ref 5–15)
Anion gap: 13 (ref 5–15)
BUN: 27 mg/dL — AB (ref 6–20)
BUN: 24 mg/dL — ABNORMAL HIGH (ref 6–20)
CALCIUM: 8.6 mg/dL — AB (ref 8.9–10.3)
CALCIUM: 8.9 mg/dL (ref 8.9–10.3)
CHLORIDE: 105 mmol/L (ref 101–111)
CO2: 21 mmol/L — ABNORMAL LOW (ref 22–32)
CO2: 25 mmol/L (ref 22–32)
CREATININE: 0.92 mg/dL (ref 0.44–1.00)
Chloride: 105 mmol/L (ref 101–111)
Creatinine, Ser: 0.76 mg/dL (ref 0.44–1.00)
GFR calc Af Amer: >60 mL/min (ref >60)
GFR calc Af Amer: >60 mL/min (ref >60)
GFR calc non Af Amer: >60 mL/min (ref >60)
GLUCOSE: 103 mg/dL — AB (ref 65–99)
GLUCOSE: 134 mg/dL — AB (ref 65–99)
POTASSIUM: 4.1 mmol/L (ref 3.5–5.1)
Potassium: 3.5 mmol/L (ref 3.5–5.1)
SODIUM: 139 mmol/L (ref 135–145)
Sodium: 142 mmol/L (ref 135–145)

## 2016-06-14 LAB — CBC
HCT: 29.9 % — ABNORMAL LOW (ref 36.0–46.0)
HEMATOCRIT: 30 % — AB (ref 36.0–46.0)
HEMATOCRIT: 28 % — AB (ref 36.0–46.0)
HEMOGLOBIN: 9.4 g/dL — AB (ref 12.0–15.0)
HEMOGLOBIN: 8.8 g/dL — AB (ref 12.0–15.0)
Hemoglobin: 9.5 g/dL — ABNORMAL LOW (ref 12.0–15.0)
MCH: 29.4 pg (ref 26.0–34.0)
MCH: 28.7 pg (ref 26.0–34.0)
MCH: 28.4 pg (ref 26.0–34.0)
MCHC: 31.8 g/dL (ref 30.0–36.0)
MCHC: 31.3 g/dL (ref 30.0–36.0)
MCHC: 31.4 g/dL (ref 30.0–36.0)
MCV: 92.6 fL (ref 78.0–100.0)
MCV: 91.5 fL (ref 78.0–100.0)
MCV: 90.3 fL (ref 78.0–100.0)
PLATELETS: 346 10*3/uL (ref 150–400)
Platelets: 302 10*3/uL (ref 150–400)
Platelets: 278 10*3/uL (ref 150–400)
RBC: 3.23 MIL/uL — ABNORMAL LOW (ref 3.87–5.11)
RBC: 3.28 MIL/uL — AB (ref 3.87–5.11)
RBC: 3.1 MIL/uL — ABNORMAL LOW (ref 3.87–5.11)
RDW: 13.4 % (ref 11.5–15.5)
RDW: 13.2 % (ref 11.5–15.5)
RDW: 13.2 % (ref 11.5–15.5)
WBC: 12.8 10*3/uL — AB (ref 4.0–10.5)
WBC: 10.5 10*3/uL (ref 4.0–10.5)
WBC: 11 10*3/uL — AB (ref 4.0–10.5)

## 2016-06-14 LAB — TROPONIN I
Troponin I: <0.03 ng/mL (ref <0.03)
Troponin I: <0.03 ng/mL (ref <0.03)
Troponin I: <0.03 ng/mL (ref <0.03)

## 2016-06-14 LAB — GLUCOSE, CAPILLARY
GLUCOSE-CAPILLARY: 94 mg/dL (ref 65–99)
Glucose-Capillary: 120 mg/dL — ABNORMAL HIGH (ref 65–99)
Glucose-Capillary: 101 mg/dL — ABNORMAL HIGH (ref 65–99)
Glucose-Capillary: 115 mg/dL — ABNORMAL HIGH (ref 65–99)

## 2016-06-14 SURGERY — DEBRIDEMENT, INGUINAL REGION
Anesthesia: General | Site: Groin | Laterality: Right

## 2016-06-14 MED ORDER — THROMBIN 20000 UNITS EX SOLR
CUTANEOUS | Status: AC
  Filled 2016-06-14: qty 20000

## 2016-06-14 MED ORDER — FENTANYL CITRATE (PF) 250 MCG/5ML IJ SOLN
INTRAMUSCULAR | Status: AC
  Filled 2016-06-14: qty 5

## 2016-06-14 MED ORDER — IOPAMIDOL (ISOVUE-300) INJECTION 61%
INTRAVENOUS | Status: AC
  Filled 2016-06-14: qty 50

## 2016-06-14 MED ORDER — EPHEDRINE 5 MG/ML INJ
INTRAVENOUS | Status: AC
  Filled 2016-06-14: qty 10

## 2016-06-14 MED ORDER — LACTATED RINGERS IV SOLN
INTRAVENOUS | Status: DC | PRN
  Administered 2016-06-14: 07:00:00 via INTRAVENOUS

## 2016-06-14 MED ORDER — GLYCOPYRROLATE 0.2 MG/ML IV SOSY
PREFILLED_SYRINGE | INTRAVENOUS | Status: DC | PRN
  Administered 2016-06-14: .2 mg via INTRAVENOUS

## 2016-06-14 MED ORDER — SUGAMMADEX SODIUM 200 MG/2ML IV SOLN
INTRAVENOUS | Status: DC | PRN
  Administered 2016-06-14: 118 mg via INTRAVENOUS

## 2016-06-14 MED ORDER — MORPHINE SULFATE (PF) 4 MG/ML IV SOLN
2.0000 mg | INTRAVENOUS | Status: DC | PRN
  Administered 2016-06-14 – 2016-06-16 (×3): 2 mg via INTRAVENOUS
  Filled 2016-06-14 (×3): qty 1

## 2016-06-14 MED ORDER — EPHEDRINE SULFATE-NACL 50-0.9 MG/10ML-% IV SOSY
PREFILLED_SYRINGE | INTRAVENOUS | Status: DC | PRN
  Administered 2016-06-14: 10 mg via INTRAVENOUS

## 2016-06-14 MED ORDER — SODIUM CHLORIDE 0.9 % IV SOLN
INTRAVENOUS | Status: DC | PRN
  Administered 2016-06-14: 500 mL

## 2016-06-14 MED ORDER — ONDANSETRON HCL 4 MG/2ML IJ SOLN
INTRAMUSCULAR | Status: AC
  Filled 2016-06-14: qty 2

## 2016-06-14 MED ORDER — LIDOCAINE 2% (20 MG/ML) 5 ML SYRINGE
INTRAMUSCULAR | Status: AC
  Filled 2016-06-14: qty 5

## 2016-06-14 MED ORDER — NITROGLYCERIN 0.4 MG SL SUBL
SUBLINGUAL_TABLET | SUBLINGUAL | Status: AC
  Filled 2016-06-14: qty 1

## 2016-06-14 MED ORDER — PROPOFOL 10 MG/ML IV BOLUS
INTRAVENOUS | Status: AC
  Filled 2016-06-14: qty 20

## 2016-06-14 MED ORDER — LIDOCAINE 2% (20 MG/ML) 5 ML SYRINGE
INTRAMUSCULAR | Status: DC | PRN
  Administered 2016-06-14: 50 mg via INTRAVENOUS
  Administered 2016-06-14: 100 mg via INTRAVENOUS

## 2016-06-14 MED ORDER — 0.9 % SODIUM CHLORIDE (POUR BTL) OPTIME
TOPICAL | Status: DC | PRN
  Administered 2016-06-14: 2000 mL

## 2016-06-14 MED ORDER — ROCURONIUM BROMIDE 10 MG/ML (PF) SYRINGE
PREFILLED_SYRINGE | INTRAVENOUS | Status: DC | PRN
  Administered 2016-06-14: 40 mg via INTRAVENOUS

## 2016-06-14 MED ORDER — MIDAZOLAM HCL 2 MG/2ML IJ SOLN
INTRAMUSCULAR | Status: AC
  Filled 2016-06-14: qty 2

## 2016-06-14 MED ORDER — ONDANSETRON HCL 4 MG/2ML IJ SOLN
INTRAMUSCULAR | Status: DC | PRN
  Administered 2016-06-14: 4 mg via INTRAVENOUS

## 2016-06-14 MED ORDER — PROPOFOL 10 MG/ML IV BOLUS
INTRAVENOUS | Status: DC | PRN
  Administered 2016-06-14: 100 mg via INTRAVENOUS

## 2016-06-14 MED ORDER — NITROGLYCERIN 0.4 MG SL SUBL
0.4000 mg | SUBLINGUAL_TABLET | SUBLINGUAL | Status: DC | PRN
  Administered 2016-06-14: 0.4 mg via SUBLINGUAL

## 2016-06-14 MED ORDER — ROCURONIUM BROMIDE 10 MG/ML (PF) SYRINGE
PREFILLED_SYRINGE | INTRAVENOUS | Status: AC
  Filled 2016-06-14: qty 5

## 2016-06-14 MED ORDER — FENTANYL CITRATE (PF) 100 MCG/2ML IJ SOLN
INTRAMUSCULAR | Status: DC | PRN
  Administered 2016-06-14: 100 ug via INTRAVENOUS

## 2016-06-14 MED ORDER — FENTANYL CITRATE (PF) 100 MCG/2ML IJ SOLN: 25.0000 ug | INTRAMUSCULAR | Status: DC | PRN

## 2016-06-14 SURGICAL SUPPLY — 62 items
ADH SKN CLS APL DERMABOND .7 (GAUZE/BANDAGES/DRESSINGS)
AGENT HMST SPONGE THK3/8 (HEMOSTASIS)
BANDAGE ESMARK 6X9 LF (GAUZE/BANDAGES/DRESSINGS) IMPLANT
BNDG CMPR 9X6 STRL LF SNTH (GAUZE/BANDAGES/DRESSINGS)
BNDG ESMARK 6X9 LF (GAUZE/BANDAGES/DRESSINGS)
BNDG GAUZE ELAST 4 BULKY (GAUZE/BANDAGES/DRESSINGS) IMPLANT
CANISTER SUCT 3000ML PPV (MISCELLANEOUS) ×3 IMPLANT
CANISTER WOUND CARE 500ML ATS (WOUND CARE) ×2 IMPLANT
CANNULA VESSEL 3MM 2 BLNT TIP (CANNULA) ×6 IMPLANT
CLIP TI MEDIUM 24 (CLIP) ×3 IMPLANT
CLIP TI WIDE RED SMALL 24 (CLIP) ×3 IMPLANT
COLLECTOR WOUND DRAINAGE SMALL (WOUND CARE) ×2 IMPLANT
CUFF TOURNIQUET SINGLE 24IN (TOURNIQUET CUFF) IMPLANT
CUFF TOURNIQUET SINGLE 34IN LL (TOURNIQUET CUFF) IMPLANT
CUFF TOURNIQUET SINGLE 44IN (TOURNIQUET CUFF) IMPLANT
DERMABOND ADVANCED (GAUZE/BANDAGES/DRESSINGS)
DERMABOND ADVANCED .7 DNX12 (GAUZE/BANDAGES/DRESSINGS) IMPLANT
DRAIN SNY WOU (WOUND CARE) IMPLANT
DRAPE HALF SHEET 40X57 (DRAPES) IMPLANT
DRAPE X-RAY CASS 24X20 (DRAPES) IMPLANT
ELECT REM PT RETURN 9FT ADLT (ELECTROSURGICAL) ×3
ELECTRODE REM PT RTRN 9FT ADLT (ELECTROSURGICAL) ×2 IMPLANT
EVACUATOR SILICONE 100CC (DRAIN) IMPLANT
GAUZE SPONGE 4X4 12PLY STRL (GAUZE/BANDAGES/DRESSINGS) ×1 IMPLANT
GLOVE BIO SURGEON STRL SZ7.5 (GLOVE) ×3 IMPLANT
GLOVE BIOGEL PI IND STRL 7.5 (GLOVE) ×1 IMPLANT
GLOVE BIOGEL PI INDICATOR 7.5 (GLOVE) ×1
GLOVE SURG SS PI 6.5 STRL IVOR (GLOVE) ×2 IMPLANT
GLOVE SURG SS PI 7.5 STRL IVOR (GLOVE) ×2 IMPLANT
GOWN STRL REUS W/ TWL LRG LVL3 (GOWN DISPOSABLE) ×5 IMPLANT
GOWN STRL REUS W/TWL LRG LVL3 (GOWN DISPOSABLE) ×6
HEMOSTAT SPONGE AVITENE ULTRA (HEMOSTASIS) IMPLANT
KIT BASIN OR (CUSTOM PROCEDURE TRAY) ×3 IMPLANT
KIT ROOM TURNOVER OR (KITS) ×3 IMPLANT
NS IRRIG 1000ML POUR BTL (IV SOLUTION) ×6 IMPLANT
PACK GENERAL/GYN (CUSTOM PROCEDURE TRAY) ×1 IMPLANT
PACK PERIPHERAL VASCULAR (CUSTOM PROCEDURE TRAY) ×3 IMPLANT
PACK UNIVERSAL I (CUSTOM PROCEDURE TRAY) ×1 IMPLANT
PAD ARMBOARD 7.5X6 YLW CONV (MISCELLANEOUS) ×6 IMPLANT
SET COLLECT BLD 21X3/4 12 (NEEDLE) IMPLANT
STAPLER VISISTAT 35W (STAPLE) IMPLANT
STOPCOCK 4 WAY LG BORE MALE ST (IV SETS) IMPLANT
SUT ETHILON 3 0 PS 1 (SUTURE) IMPLANT
SUT PROLENE 5 0 C 1 24 (SUTURE) ×3 IMPLANT
SUT PROLENE 6 0 CC (SUTURE) ×3 IMPLANT
SUT PROLENE 7 0 BV 1 (SUTURE) IMPLANT
SUT PROLENE 7 0 BV1 MDA (SUTURE) IMPLANT
SUT SILK 2 0 SH (SUTURE) ×3 IMPLANT
SUT SILK 3 0 (SUTURE)
SUT SILK 3-0 18XBRD TIE 12 (SUTURE) IMPLANT
SUT VIC AB 2-0 CTX 36 (SUTURE) IMPLANT
SUT VIC AB 2-0 SH 27 (SUTURE)
SUT VIC AB 2-0 SH 27XBRD (SUTURE) ×2 IMPLANT
SUT VIC AB 3-0 SH 27 (SUTURE) ×12
SUT VIC AB 3-0 SH 27X BRD (SUTURE) ×8 IMPLANT
SUT VIC AB 4-0 PS2 27 (SUTURE) ×2 IMPLANT
SUT VICRYL 4-0 PS2 18IN ABS (SUTURE) IMPLANT
TAPE UMBILICAL COTTON 1/8X30 (MISCELLANEOUS) IMPLANT
TRAY FOLEY W/METER SILVER 16FR (SET/KITS/TRAYS/PACK) ×5 IMPLANT
TUBING EXTENTION W/L.L. (IV SETS) IMPLANT
UNDERPAD 30X30 (UNDERPADS AND DIAPERS) ×3 IMPLANT
WATER STERILE IRR 1000ML POUR (IV SOLUTION) ×3 IMPLANT

## 2016-06-14 NOTE — Progress Notes (Signed)
EKG CRITICAL VALUE     12 lead EKG performed.  Critical value noted. Huntley DecSara , RN notified.   Oda Coganiara S Woodard, CCT 06/14/2016 11:36 AM

## 2016-06-14 NOTE — Plan of Care (Signed)
Problem: Activity: Goal: Ability to tolerate increased activity will improve Outcome: Progressing Pt to be up OOB in AM  Problem: Bowel/Gastric: Goal: Gastrointestinal status for postoperative course will improve Outcome: Progressing Active BS. Awaiting BM.  Problem: Education: Goal: Knowledge of prescribed regimen will improve Outcome: Progressing Education ongoing  Problem: Physical Regulation: Goal: Signs and symptoms of graft occlusion will improve Outcome: Progressing Faint doppler flow bilaterally   Problem: Respiratory: Goal: Ability to maintain adequate oxygenation will improve/be maintained will improve Outcome: Progressing WNL on [facility]

## 2016-06-14 NOTE — Consult Note (Signed)
Cardiology Consultation:   Patient ID: Andrea [name]; [id number]; 01/25/1950   Admit date: 06/12/2016 Date of Consult: 06/14/2016  Primary Care Provider: Jaclyn Shaggy, MD Primary Cardiologist: Nahser   Patient Profile:   Andrea [name] is a 66 y.o. female with a hx of CAD/CABG recent who is being seen today for the evaluation of Abnormal EKG, possible STEMI at the request of Dr. Darrick Penna.  History of Present Illness:   Andrea [name] is a 66 year old female with known coronary artery disease status post CABG with cardiac catheterization on 04/13/16 showing occlusions of her native coronaries with LIMA to LAD patent, SVG to obtuse marginal patent and SVG to distal RCA with tight stenosis that was stented with a 3.5 x 12 Synergy stent who today was undergoing right groin debridement by Dr. Darrick Penna after she has had femoropopliteal bypass. During induction/early in anesthesia., She did have transient hypotension. ST changes were noted on telemetry. There was concern for possible ST segment changes concerning for ST elevation. Immediately postop she stated that she may have had some chest discomfort. An EKG was obtained. I personally reviewed EKGs. There was one obtained that showed isoelectric baseline with T-wave inversions noted in the V3 through V6 leads as was seen on prior ECG. A second EKG done a few seconds after devastated some baseline artifact and was interpreted by the computer as inferior ST elevation myocardial infarction. When carefully visualizing this, there does not appear to be greater than 1 mm of ST elevation in the inferior leads. Code STEMI was initially called.  Now that she is more alert and awake, I questioned her and she did not have any chest discomfort. She was not complaining of any shortness of breath. She has a right groin drain in place. Code STEMI was canceled.  I spoke to her family member at length. First troponin has been drawn. This will continue to be  cycled.  Past Medical History:  Diagnosis Date  . Asthma   . Coronary artery disease   . Diabetes mellitus without complication (HCC)   . Dyspnea   . Fibromyalgia   . GERD (gastroesophageal reflux disease)   . High cholesterol   . Hypertension   . PONV (postoperative nausea and vomiting)   . PVD (peripheral vascular disease) (HCC)   . Urinary frequency   . Urinary incontinence     Past Surgical History:  Procedure Laterality Date  . ABDOMINAL AORTOGRAM W/LOWER EXTREMITY N/A 03/25/2016   Procedure: Abdominal Aortogram w/Lower Extremity;  Surgeon: Maeola Harman, MD;  Location: [facility] INVASIVE CV LAB;  Service: Cardiovascular;  Laterality: N/A;  . CORONARY ARTERY BYPASS GRAFT    . CORONARY STENT INTERVENTION N/A 04/15/2016   Procedure: Coronary Stent Intervention;  Surgeon: Lennette Bihari, MD;  Location: [facility] INVASIVE CV LAB;  Service: Cardiovascular;  Laterality: N/A;  . ENDARTERECTOMY FEMORAL Right 05/21/2016   Procedure: RIGHT FEMORAL ENDARTERECTOMY;  Surgeon: Maeola Harman, MD;  Location: [facility] OR;  Service: Vascular;  Laterality: Right;  . FEMORAL-POPLITEAL BYPASS GRAFT Right 05/21/2016   Procedure: RIGHT FEMORAL-BELOW KNEE POPLITEAL ARTERY BYPASS GRAFT USING PROPATEN GORETEX GRAFT;  Surgeon: Maeola Harman, MD;  Location: [facility] OR;  Service: Vascular;  Laterality: Right;  . KNEE SURGERY    . LEFT HEART CATH AND CORS/GRAFTS ANGIOGRAPHY N/A 04/15/2016   Procedure: Left Heart Cath and Cors/Grafts Angiography;  Surgeon: Lennette Bihari, MD;  Location: [facility] INVASIVE CV LAB;  Service: Cardiovascular;  Laterality: N/A;  . MULTIPLE TOOTH EXTRACTIONS  Inpatient Medications: Scheduled Meds: . aspirin EC  81 mg Oral Daily  . atorvastatin  80 mg Oral q1800  . clopidogrel  75 mg Oral Q breakfast  . gabapentin  600 mg Oral TID  . heparin  5,000 Units Subcutaneous Q8H  . hydrochlorothiazide  25 mg Oral Daily  . insulin aspart  0-9 Units Subcutaneous TID WC  . insulin  glargine  30 Units Subcutaneous Q2200  . lisinopril  10 mg Oral Daily  . metFORMIN  1,000 mg Oral BID WC  . metoprolol tartrate  12.5 mg Oral BID  . mometasone-formoterol  2 puff Inhalation BID  . nitroGLYCERIN      . oxybutynin  5 mg Oral BID  . pantoprazole  40 mg Oral Daily  . potassium chloride  20-40 mEq Oral Once   Continuous Infusions: . sodium chloride 50 mL/hr at 06/12/16 1738  . piperacillin-tazobactam (ZOSYN)  IV 3.375 g (06/14/16 0420)  . vancomycin Stopped (06/13/16 2040)   PRN Meds: acetaminophen **OR** acetaminophen, albuterol, alum & mag hydroxide-simeth, bisacodyl, fentaNYL (SUBLIMAZE) injection, guaiFENesin-dextromethorphan, hydrALAZINE, labetalol, metoprolol tartrate, morphine injection, nitroGLYCERIN, ondansetron, oxyCODONE-acetaminophen, phenol, polyethylene glycol  Allergies:    Allergies  Allergen Reactions  . Naproxen Itching  . Tramadol Hcl Itching    Social History:   Social History   Social History  . Marital status: Widowed    Spouse name: N/A  . Number of children: N/A  . Years of education: N/A   Occupational History  . Not on file.   Social History Main Topics  . Smoking status: Former Smoker    Packs/day: 0.25    Types: Cigarettes    Quit date: 04/04/2016  . Smokeless tobacco: Never Used  . Alcohol use No  . Drug use: No  . Sexual activity: Not on file   Other Topics Concern  . Not on file   Social History Narrative  . No narrative on file    Family History:   The patient's family history includes Stroke in her mother.  ROS:  Please see the history of present illness.  ROS denies any fevers, syncope, bleeding All other ROS reviewed and negative.     Physical Exam/Data:   Vitals:   06/14/16 0517 06/14/16 0845 06/14/16 0900 06/14/16 0915  BP: (!) 129/35 (!) 139/46 (!) 148/51 (!) 126/55  Pulse: (!) 57 90 83 88  Resp: 18 12 11 15   Temp: 98.5 F (36.9 C) 97.6 F (36.4 C)    TempSrc: Oral     SpO2: 94% 99% 100% 100%   Weight:      Height:        Intake/Output Summary (Last 24 hours) at 06/14/16 0934 Last data filed at 06/14/16 0836  Gross per 24 hour  Intake          2969.33 ml  Output              350 ml  Net          2619.33 ml   Filed Weights   06/12/16 1703  Weight: 130 lb 1.6 oz (59 kg)   Body mass index is 23.8 kg/m.  General:  Well nourished, well developed, Looks older than stated age in no acute distress HEENT: normal Lymph: no adenopathy Neck: no JVD Endocrine:  No thryomegaly Vascular: No carotid bruits;   Cardiac:  normal S1, S2; RRR; no murmur  Lungs:  clear to auscultation bilaterally, no wheezing, rhonchi or rales  Abd: soft, nontender, no hepatomegaly  Ext:  no edema, right groin debridement surgical wound site with drain in place. Musculoskeletal:  No deformities, BUE and BLE strength normal and equal Skin: warm and dry  Neuro:  CNs 2-12 intact, no focal abnormalities noted Psych:  Normal affect    EKG:  Multiple EKGs reviewed as above. I do not think that this is inferior ST elevation myocardial infarction as computer interpreted. Her ST segment depression/T-wave inversions in the lateral precordial leads have been present on prior EKGs as well.  Relevant CV Studies:  Cardiac catheterization in April 2018 as described above.  Laboratory Data:  Chemistry Recent Labs Lab 06/12/16 1909 06/13/16 0310 06/14/16 0125  NA 138 137 139  K 4.5 3.7 4.1  CL 106 105 105  CO2 21* 21* 21*  GLUCOSE 147* 144* 103*  BUN 48* 43* 27*  CREATININE 1.26* 1.08* 0.92  CALCIUM 8.7* 8.4* 8.6*  GFRNONAA 44* 53* >60  GFRAA 51* >60 >60  ANIONGAP 11 11 13      Recent Labs Lab 06/12/16 1909  PROT 6.7  ALBUMIN 2.9*  AST 14*  ALT 10*  ALKPHOS 77  BILITOT 0.6   Hematology Recent Labs Lab 06/12/16 1909 06/13/16 0310 06/14/16 0125  WBC 13.7* 11.0* 12.8*  RBC 3.27* 3.16* 3.23*  HGB 9.5* 9.1* 9.5*  HCT 30.4* 29.1* 29.9*  MCV 93.0 92.1 92.6  MCH 29.1 28.8 29.4  MCHC 31.3  31.3 31.8  RDW 13.8 13.5 13.4  PLT 344 317 346   Cardiac EnzymesNo results for input(s): TROPONINI in the last 168 hours. No results for input(s): TROPIPOC in the last 168 hours.  BNPNo results for input(s): BNP, PROBNP in the last 168 hours.  DDimer No results for input(s): DDIMER in the last 168 hours.  Radiology/Studies:  No results found.  Assessment and Plan:   66 year old undergoing debridement of femoropopliteal bypass right groin site with ECG changes during procedure and abnormal EKG post procedure.  Abnormal EKG/chest pain/possible ST elevation myocardial infarction  - I do not think this is a ST elevation myocardial infarction. She does not have greater than 1 mm of ST segment elevation in the inferior leads. EKG a few seconds prior to the one that was interpreted as inferior STEMI demonstrated normal/no ischemic changes.  - We will observe her continue to cycle cardiac markers. If troponin becomes markedly elevated, of course we will consider diagnostic angiogram.  - Currently she is chest pain-free without any shortness of breath. No anginal symptoms.  - I discussed findings with Dr. Darrick Penna.  Known CAD/CABG  - Certainly given her underlying coronary artery disease, and transient hypotension Place, she could certainly become ischemic. For now, we will continue to monitor.  - Recent SVG to distal RCA stent in April 2018.  Peripheral vascular disease  - Groin debridement by Dr. Darrick Penna. Intact femoropopliteal bypass.  Tobacco use  - Continue to promote tobacco cessation.  Diabetes with peripheral vascular disease  - No changes.  Critical care time 45 minutes spent with extensive data review, discussion with family, discussion with patient, discussion with nursing team as well as Dr. Darrick Penna in this patient previously felt to have ST elevation myocardial infarction with other extensive multiple comorbidities.  Signed, Donato Schultz, MD  06/14/2016 9:34 AM

## 2016-06-14 NOTE — Progress Notes (Signed)
Karsten RoKim Trinh PA paged re:  If RN should give heparin subQ with groin looking like it is oozing a bit of blood.  Troponins are negative at this time with no EKG changes on bedside monitor.  Will continue to monitor closely.

## 2016-06-14 NOTE — Anesthesia Procedure Notes (Signed)
Procedure Name: Intubation Date/Time: 06/14/2016 7:49 AM Performed by: Rise PatienceBELL, Nyeshia Mysliwiec T Pre-anesthesia Checklist: Patient identified, Emergency Drugs available, Suction available and Patient being monitored Patient Re-evaluated:Patient Re-evaluated prior to inductionOxygen Delivery Method: Circle System Utilized Preoxygenation: Pre-oxygenation with 100% oxygen Intubation Type: IV induction Ventilation: Mask ventilation without difficulty and Oral airway inserted - appropriate to patient size Laryngoscope Size: Hyacinth MeekerMiller and 2 Grade View: Grade I Tube type: Oral Tube size: 7.5 mm Number of attempts: 1 Airway Equipment and Method: Stylet and Oral airway Placement Confirmation: ETT inserted through vocal cords under direct vision,  positive ETCO2 and breath sounds checked- equal and bilateral Secured at: 22 cm Tube secured with: Tape Dental Injury: Teeth and Oropharynx as per pre-operative assessment

## 2016-06-14 NOTE — Interval H&P Note (Signed)
History and Physical Interval Note:  06/14/2016 7:36 AM  Andrea [name]  has presented today for surgery, with the diagnosis of non healing of groin wound  The various methods of treatment have been discussed with the patient and family. After consideration of risks, benefits and other options for treatment, the patient has consented to  Procedure(s): GROIN DEBRIDEMENT (Right) POSSIBLE  REMOVAL BYPASS GRAFT FEMORAL-POPLITEAL ARTERY (Right) as a surgical intervention .  The patient's history has been reviewed, patient examined, no change in status, stable for surgery.  I have reviewed the patient's chart and labs.  Questions were answered to the patient's satisfaction.     Fabienne BrunsFields, Charles

## 2016-06-14 NOTE — Anesthesia Postprocedure Evaluation (Signed)
Anesthesia Post Note  Patient: Andrea [name]  Procedure(s) Performed: Procedure(s) (LRB): GROIN DEBRIDEMENT (Right) APPLICATION OF WOUND VAC (Right)     Patient location during evaluation: PACU Anesthesia Type: General Level of consciousness: awake Pain management: pain level controlled Vital Signs Assessment: post-procedure vital signs reviewed and stable Respiratory status: spontaneous breathing Cardiovascular status: stable Anesthetic complications: no    Last Vitals:  Vitals:   06/14/16 0915 06/14/16 0930  BP: (!) 126/55 (!) 113/47  Pulse: 88 88  Resp: 15 12  Temp:  36.5 C    Last Pain:  Vitals:   06/14/16 0517  TempSrc: Oral  PainSc:                  Dewane Timson

## 2016-06-14 NOTE — Transfer of Care (Signed)
Immediate Anesthesia Transfer of Care Note  Patient: Andrea [name]  Procedure(s) Performed: Procedure(s): GROIN DEBRIDEMENT (Right) POSSIBLE  REMOVAL BYPASS GRAFT FEMORAL-POPLITEAL ARTERY (Right)  Patient Location: PACU  Anesthesia Type:General  Level of Consciousness: awake, alert  and oriented  Airway & Oxygen Therapy: Patient Spontanous Breathing and Patient connected to nasal cannula oxygen  Post-op Assessment: Report given to RN, Post -op Vital signs reviewed and stable and Patient moving all extremities X 4  Post vital signs: Reviewed and stable  Last Vitals:  Vitals:   06/13/16 1924 06/14/16 0517  BP: (!) 139/44 (!) 129/35  Pulse: 61 (!) 57  Resp: 18 18  Temp: 37.5 C 36.9 C    Last Pain:  Vitals:   06/14/16 0517  TempSrc: Oral  PainSc:       Patients Stated Pain Goal: 3 (06/12/16 2024)  Complications: No apparent anesthesia complications

## 2016-06-14 NOTE — H&P (View-Only) (Signed)
[facility] [place]  Subjective  - feels ok    Objective (!) 146/43 67 97.8 F (36.6 C) (Oral) 18 96%  Intake/Output Summary (Last 24 hours) at 06/13/16 1050 Last data filed at 06/13/16 0916  Gross per 24 hour  Intake              100 ml  Output             1350 ml  Net            -1250 ml   Right groin black eshar no real erythema   Assessment/Planning: Will debride groin in OR tomorrow NPO p midnight/consent D/w with pt that graft may need removal and she could lose her leg Continue antibiotics  Andrea [name], Andrea [name] 06/13/2016 10:50 AM --  Laboratory Lab Results:  Recent Labs  06/12/16 1909 06/13/16 0310  WBC 13.7* 11.0*  HGB 9.5* 9.1*  HCT 30.4* 29.1*  PLT 344 317   BMET  Recent Labs  06/12/16 1909 06/13/16 0310  NA 138 137  K 4.5 3.7  CL 106 105  CO2 21* 21*  GLUCOSE 147* 144*  BUN 48* 43*  CREATININE 1.26* 1.08*  CALCIUM 8.7* 8.4*    COAG Lab Results  Component Value Date   INR 1.10 06/12/2016   INR 0.93 05/13/2016   INR 0.9 04/14/2016   No results found for: PTT

## 2016-06-14 NOTE — Op Note (Signed)
Procedure: Debridement right groin  Preoperative diagnosis: Poorly healing right groin post femoropopliteal bypass  postoperative diagnosis: Same  Anesthesia: Gen.  Assistant: Nurse  Operative findings: #1 dry eschar debrided fat necrosis beneath this no exposed graft  #2 VAC dressing placed right groin  Operative details: After obtaining informed consent, the patient was taken to the operating room. The patient was placed in supine position operating table. After induction general anesthesia the patient's entire right lower extremity was prepped and draped in usual sterile fashion after placing a Foley catheter. Next the eschar in the left groin was sharply debrided with a scalpel. This was debrided down into the subcutaneous tissues. There was good skin edge bleeding and some bleeding from the soft tissues in this area. The wound was approximate 5 x 3 cm length by width and 3 mm in depth. I did not encounter any purulent material. There was some pale appearing fat. Skin and subcutaneous tissues only were debrided. Next a VAC dressing was placed on the right groin. This was placed at 125 mm suction.  The patient did have some EKG changes with some ST elevation during the course of the operation. A full 12-lead EKG will be obtained in the recovery room. A troponin was also sent from the operating room. Otherwise the patient was stable during the procedure and there were no complications. Instrument sponge and needle count was correct in the case. The patient was taken to recovery room in stable condition.  Fabienne Brunsharles Aevah Stansbery, MD [facility] [place] Office: [phone] Pager: [phone]

## 2016-06-14 NOTE — Anesthesia Preprocedure Evaluation (Addendum)
Anesthesia Evaluation  Patient identified by MRN, date of birth, ID band Patient awake    Reviewed: Allergy & Precautions, NPO status , Patient's Chart, lab work & pertinent test results  History of Anesthesia Complications (+) PONV and history of anesthetic complications  Airway Mallampati: II  TM Distance: >3 FB Neck ROM: Full    Dental  (+) Edentulous Upper, Partial Lower, Dental Advisory Given   Pulmonary shortness of breath, asthma , former smoker,    breath sounds clear to auscultation       Cardiovascular hypertension, Pt. on medications and Pt. on home beta blockers + angina + CAD, + Cardiac Stents, + CABG and + Peripheral Vascular Disease   Rhythm:Regular Rate:Normal     Neuro/Psych    GI/Hepatic GERD  ,  Endo/Other  diabetes  Renal/GU      Musculoskeletal  (+) Fibromyalgia -  Abdominal   Peds  Hematology   Anesthesia Other Findings   Reproductive/Obstetrics                            Anesthesia Physical Anesthesia Plan  ASA: IV  Anesthesia Plan: General   Post-op Pain Management:    Induction: Intravenous  Airway Management Planned: Oral ETT  Additional Equipment:   Intra-op Plan:   Post-operative Plan: Extubation in OR and Possible Post-op intubation/ventilation  Informed Consent: I have reviewed the patients History and Physical, chart, labs and discussed the procedure including the risks, benefits and alternatives for the proposed anesthesia with the patient or authorized representative who has indicated his/her understanding and acceptance.   Dental advisory given  Plan Discussed with: CRNA and Anesthesiologist  Anesthesia Plan Comments:         Anesthesia Quick Evaluation

## 2016-06-15 ENCOUNTER — Encounter (HOSPITAL_COMMUNITY): Payer: Self-pay | Admitting: Vascular Surgery

## 2016-06-15 DIAGNOSIS — I10 Essential (primary) hypertension: Secondary | ICD-10-CM

## 2016-06-15 DIAGNOSIS — I25700 Atherosclerosis of coronary artery bypass graft(s), unspecified, with unstable angina pectoris: Secondary | ICD-10-CM

## 2016-06-15 LAB — GLUCOSE, CAPILLARY
GLUCOSE-CAPILLARY: 125 mg/dL — AB (ref 65–99)
GLUCOSE-CAPILLARY: 83 mg/dL (ref 65–99)
Glucose-Capillary: 128 mg/dL — ABNORMAL HIGH (ref 65–99)
Glucose-Capillary: 120 mg/dL — ABNORMAL HIGH (ref 65–99)
Glucose-Capillary: 178 mg/dL — ABNORMAL HIGH (ref 65–99)

## 2016-06-15 NOTE — Progress Notes (Signed)
Pharmacy Antibiotic Note  Andrea [name] is a 66 y.o. female admitted on 06/12/2016 with right groin wound after recent right femoral endarterectomy and femoral to below-knee popliteal artery bypass with graft.  Pharmacy has been consulted for vancomycin and Zosyn dosing. SCr stable.  Cultures remain negative.  Plan: Continue vancomycin 1g q 24 hrs, goal trough 10-15 mcg/ml Zosyn 3.375 g IV q8h to be infused over 4 hours Monitor renal function, clinical progress, and culture data  Will plan to check vancomycin trough level before tomorrow's dose.   Height: 5\' 2"  (157.5 cm) Weight: 130 lb 1.6 oz (59 kg) IBW/kg (Calculated) : 50.1  Temp (24hrs), Avg:98.4 F (36.9 C), Min:98.1 F (36.7 C), Max:98.6 F (37 C)   Recent Labs Lab 06/12/16 1909 06/13/16 0310 06/14/16 0125 06/14/16 0856 06/14/16 1419  WBC 13.7* 11.0* 12.8* 10.5 11.0*  CREATININE 1.26* 1.08* 0.92 0.76  --     Estimated Creatinine Clearance: 56.9 mL/min (by C-G formula based on SCr of 0.76 mg/dL).    Allergies  Allergen Reactions  . Naproxen Itching  . Tramadol Hcl Itching   Antimicrobials this admission: Vancomycin 6/1 >>  Zosyn 6/1 >>    Dose adjustments this admission: none   Microbiology results: 6/1 blood x2> ngtd 6/1 MRSA PCR- neg  Thank you for allowing pharmacy to be a part of this patient's care.  Tad MooreJessica Maurica Omura, Pharm D, BCPS  Clinical Pharmacist Pager [phone]  06/15/2016 1:23 PM

## 2016-06-15 NOTE — Progress Notes (Signed)
Pt transferred to [facility] 35 vai wheelchair, vss, all patient belongings at side, sister at side, receiving RN at bedside.   Darrel HooverWilson,Rhiley Solem S 6:58 PM

## 2016-06-15 NOTE — Progress Notes (Signed)
Attempted to call report to RN on [facility], not able to take report at this time, a/w call back.   Louie BunWilson,Vermon Grays S 6:00 PM

## 2016-06-15 NOTE — Progress Notes (Signed)
Progress Note  Patient Name: Andrea [name] Date of Encounter: 06/15/2016  Primary Cardiologist: Dr. Elease Hashimoto  Subjective   Feeling well, just tired.  Denies chest pain or shortness of breath.   Inpatient Medications    Scheduled Meds: . aspirin EC  81 mg Oral Daily  . atorvastatin  80 mg Oral q1800  . clopidogrel  75 mg Oral Q breakfast  . gabapentin  600 mg Oral TID  . heparin  5,000 Units Subcutaneous Q8H  . hydrochlorothiazide  25 mg Oral Daily  . insulin aspart  0-9 Units Subcutaneous TID WC  . insulin glargine  30 Units Subcutaneous Q2200  . lisinopril  10 mg Oral Daily  . metFORMIN  1,000 mg Oral BID WC  . metoprolol tartrate  12.5 mg Oral BID  . mometasone-formoterol  2 puff Inhalation BID  . oxybutynin  5 mg Oral BID  . pantoprazole  40 mg Oral Daily  . potassium chloride  20-40 mEq Oral Once   Continuous Infusions: . sodium chloride 50 mL/hr at 06/15/16 0800  . piperacillin-tazobactam (ZOSYN)  IV 3.375 g (06/15/16 0435)  . vancomycin Stopped (06/14/16 2131)   PRN Meds: acetaminophen **OR** acetaminophen, albuterol, alum & mag hydroxide-simeth, bisacodyl, guaiFENesin-dextromethorphan, hydrALAZINE, labetalol, metoprolol tartrate, morphine injection, morphine injection, ondansetron, oxyCODONE-acetaminophen, phenol, polyethylene glycol   Vital Signs    Vitals:   06/15/16 0500 06/15/16 0600 06/15/16 0700 06/15/16 0800  BP: (!) 142/42 (!) 136/39 (!) 143/42 (!) 139/38  Pulse: (!) 52 (!) 51 (!) 51 (!) 51  Resp: 17 13 16 13   Temp:      TempSrc:      SpO2: 98% 91% 98% 96%  Weight:      Height:        Intake/Output Summary (Last 24 hours) at 06/15/16 0808 Last data filed at 06/15/16 0800  Gross per 24 hour  Intake             2610 ml  Output             1510 ml  Net             1100 ml   Filed Weights   06/12/16 1703  Weight: 59 kg (130 lb 1.6 oz)    Telemetry    Sinus bradycardia.  No events.  - Personally Reviewed  ECG    06/14/16: Sinus rhythm  rate 84 bpm.  Anterolateral ST depressions consistent with ischemia.  Unchanged from prior- Personally Reviewed  Physical Exam   GEN: Well-appearing.  No acute distress.   Neck: No JVD Cardiac: RRR, no murmurs, rubs, or gallops.  Respiratory: Mild rhonci.  No crackles or wheezes GI: Soft, nontender, non-distended  MS: No edema; No deformity. Neuro:  Nonfocal  Psych: Normal affect  Ext:  R groin drain in place  Labs    Chemistry Recent Labs Lab 06/12/16 1909 06/13/16 0310 06/14/16 0125 06/14/16 0856  NA 138 137 139 142  K 4.5 3.7 4.1 3.5  CL 106 105 105 105  CO2 21* 21* 21* 25  GLUCOSE 147* 144* 103* 134*  BUN 48* 43* 27* 24*  CREATININE 1.26* 1.08* 0.92 0.76  CALCIUM 8.7* 8.4* 8.6* 8.9  PROT 6.7  --   --   --   ALBUMIN 2.9*  --   --   --   AST 14*  --   --   --   ALT 10*  --   --   --   ALKPHOS 77  --   --   --  BILITOT 0.6  --   --   --   GFRNONAA 44* 53* >60 >60  GFRAA 51* >60 >60 >60  ANIONGAP 11 11 13 12      Hematology Recent Labs Lab 06/14/16 0125 06/14/16 0856 06/14/16 1419  WBC 12.8* 10.5 11.0*  RBC 3.23* 3.28* 3.10*  HGB 9.5* 9.4* 8.8*  HCT 29.9* 30.0* 28.0*  MCV 92.6 91.5 90.3  MCH 29.4 28.7 28.4  MCHC 31.8 31.3 31.4  RDW 13.4 13.2 13.2  PLT 346 302 278    Cardiac Enzymes Recent Labs Lab 06/14/16 0856 06/14/16 1419 06/14/16 2017  TROPONINI <0.03 <0.03 <0.03   No results for input(s): TROPIPOC in the last 168 hours.   BNPNo results for input(s): BNP, PROBNP in the last 168 hours.   DDimer No results for input(s): DDIMER in the last 168 hours.   Radiology    No results found.  Cardiac Studies   LHC 04/15/16:  Ost LAD to Prox LAD lesion, 95 %stenosed.  Prox LAD lesion, 100 %stenosed.  Ost Cx to Prox Cx lesion, 30 %stenosed.  Ost RCA lesion, 100 %stenosed.  LIMA and is normal in caliber.  Mid Cx lesion, 100 %stenosed.  SVG.  SVG.  A STENT SYNERGY DES 3.5X12 drug eluting stent was successfully placed.  Dist Graft  lesion, 85 %stenosed.  Post intervention, there is a 0% residual stenosis.  Ost RCA to Dist RCA lesion, 100 %stenosed.  The left ventricular ejection fraction is 50-55% by visual estimate.  Dist LAD lesion, 40 %stenosed.   Severe native CAD with 95% very proximal stenosis of the LAD before small first diagonal branch with total occlusion of the LAD after this diagonal; patent stent at the ostium of the circumflex with in-stent narrowing of 30% with total occlusion of the OM vessel after a small AV groove circumflex in atrial branch; and total occlusion of the RCA at its origin.  Patent LIMA graft supplying the mid LAD with mild 40% narrowing in the mid distal LAD.  Echo 04/13/16: Study Conclusions  - Left ventricle: The cavity size was normal. Wall thickness was   increased in a pattern of mild LVH. Systolic function was normal.   The estimated ejection fraction was in the range of 55% to 60%.   Wall motion was normal; there were no regional wall motion   abnormalities. Features are consistent with a pseudonormal left   ventricular filling pattern, with concomitant abnormal relaxation   and increased filling pressure (grade 2 diastolic dysfunction). - Left atrium: The atrium was moderately dilated. - Atrial septum: No defect or patent foramen ovale was identified.   Patient Profile     66 y.o. female with CAD s/p CABG  Assessment & Plan    # CAD:  Andrea [name] underwent LHC 04/13/16 with 3 vessel CAD with patent LIMA to LAD, SVG to OM and stenosis of an SVG to distal RCA.  She underwent successful DE to the SVG.  In the OR ST changes were noted on telemetry in the setting of transient hypotension.  Cardiac enzymes have been negative x3.  She is currently chest pain free and EKG is stable from 04/2016.  Continue aspirin, clopidogrel, metoprolol, and atorvastatin.  # PAD: s/p fem-pop bypass and required groin debridement with admission.  Continue medications as above.  Management  per vascular surgery.  # Hypertension: BP controlled on metoprolol, lisinopril, and HCTZ.  We will sign off.  Please feel free to call with questions.   Signed, Chilton Si,  MD  06/15/2016, 8:08 AM

## 2016-06-15 NOTE — Progress Notes (Addendum)
[facility] [place]  Subjective  - Doing OK this am.  No new complaints.   Objective (!) 143/42 (!) 51 98.6 F (37 C) (Oral) 16 98%  Intake/Output Summary (Last 24 hours) at 06/15/16 0756 Last data filed at 06/15/16 0700  Gross per 24 hour  Intake             2560 ml  Output             1510 ml  Net             1050 ml   Right groin wound vac in place to suction Left great to with ischemic changes, left lateral lower leg wound. Doppler weak venous sound DP/PT with peroneal signal best Right foot dorsum wound, calf incision healing well on right LE, doppler DP.  Active range of motion B LE and sensation grossly intact.    Assessment/Planning: 66 year old female with severely depressed ABIs bilaterally known bilateral SFA occlusions reason underwent right femoral to below-knee popliteal artery bypass with graft.   S/P #1 dry eschar debrided fat necrosis beneath this no exposed graft  #2 VAC dressing placed right groin Troponin levels normal, code STEMI canceled  Cardiology following due to abnormal EKG  Aspirin and Plavix daily   Thomasena EdisCOLLINS, EMMA [facility] 06/15/2016 7:56 AM --  Agree with above.  Start VAC every other day tomorrow Left first toe gangrene but foot warm Ruled out MI  Fabienne Brunsharles Fields, MD [facility] [place] Office: [phone] Pager: [phone]    Laboratory Lab Results:  Recent Labs  06/14/16 0856 06/14/16 1419  WBC 10.5 11.0*  HGB 9.4* 8.8*  HCT 30.0* 28.0*  PLT 302 278   BMET  Recent Labs  06/14/16 0125 06/14/16 0856  NA 139 142  K 4.1 3.5  CL 105 105  CO2 21* 25  GLUCOSE 103* 134*  BUN 27* 24*  CREATININE 0.92 0.76  CALCIUM 8.6* 8.9    COAG Lab Results  Component Value Date   INR 1.10 06/12/2016   INR 0.93 05/13/2016   INR 0.9 04/14/2016   No results found for: PTT

## 2016-06-16 LAB — BASIC METABOLIC PANEL
Anion gap: 11 (ref 5–15)
BUN: 9 mg/dL (ref 6–20)
CALCIUM: 8.3 mg/dL — AB (ref 8.9–10.3)
CO2: 24 mmol/L (ref 22–32)
CREATININE: 0.79 mg/dL (ref 0.44–1.00)
Chloride: 103 mmol/L (ref 101–111)
Glucose, Bld: 165 mg/dL — ABNORMAL HIGH (ref 65–99)
Potassium: 3.8 mmol/L (ref 3.5–5.1)
SODIUM: 138 mmol/L (ref 135–145)

## 2016-06-16 LAB — CBC
HCT: 27.4 % — ABNORMAL LOW (ref 36.0–46.0)
Hemoglobin: 8.6 g/dL — ABNORMAL LOW (ref 12.0–15.0)
MCH: 29.2 pg (ref 26.0–34.0)
MCHC: 31.4 g/dL (ref 30.0–36.0)
MCV: 92.9 fL (ref 78.0–100.0)
PLATELETS: 265 10*3/uL (ref 150–400)
RBC: 2.95 MIL/uL — AB (ref 3.87–5.11)
RDW: 13.3 % (ref 11.5–15.5)
WBC: 9.7 10*3/uL (ref 4.0–10.5)

## 2016-06-16 LAB — GLUCOSE, CAPILLARY
GLUCOSE-CAPILLARY: 80 mg/dL (ref 65–99)
GLUCOSE-CAPILLARY: 107 mg/dL — AB (ref 65–99)
GLUCOSE-CAPILLARY: 90 mg/dL (ref 65–99)
Glucose-Capillary: 119 mg/dL — ABNORMAL HIGH (ref 65–99)

## 2016-06-16 LAB — VANCOMYCIN, TROUGH: VANCOMYCIN TR: 10 ug/mL — AB (ref 15–20)

## 2016-06-16 NOTE — Progress Notes (Signed)
Pharmacy Antibiotic Note  Andrea [name] is a 66 y.o. female admitted on 06/12/2016 with right groin wound after recent right femoral endarterectomy and femoral to below-knee popliteal artery bypass with graft.  Pharmacy has been consulted for vancomycin and Zosyn dosing. SCr stable.  Cultures remain negative.  Vancomycin trough was drawn appropriately and is therapeutic at 10 mcg/mL on vancomycin 1g q24h. Patient is afebrile, WBC wnl and renal function is stable.  Plan: Continue vancomycin 1g q24h, goal trough 10-15 mcg/ml Zosyn 3.375 g IV q8h Monitor culture data, renal function and clinical course VT at SS prn   Height: 5\' 2"  (157.5 cm) Weight: 130 lb 1.6 oz (59 kg) IBW/kg (Calculated) : 50.1  Temp (24hrs), Avg:98 F (36.7 C), Min:97.8 F (36.6 C), Max:98.2 F (36.8 C)   Recent Labs Lab 06/12/16 1909 06/13/16 0310 06/14/16 0125 06/14/16 0856 06/14/16 1419 06/16/16 1931  WBC 13.7* 11.0* 12.8* 10.5 11.0* 9.7  CREATININE 1.26* 1.08* 0.92 0.76  --  0.79  VANCOTROUGH  --   --   --   --   --  10*    Estimated Creatinine Clearance: 56.9 mL/min (by C-G formula based on SCr of 0.79 mg/dL).    Allergies  Allergen Reactions  . Naproxen Itching  . Tramadol Hcl Itching   Arlean Hoppingorey M. Newman PiesBall, PharmD, BCPS Clinical Pharmacist [phone] 06/16/2016 8:35 PM

## 2016-06-16 NOTE — Progress Notes (Addendum)
  Progress Note    06/16/2016 7:43 AM 2 Days Post-Op   Subjective:  No complaints  Afebrile HR 50's-70's NSR 130's-150's systolic 96% RA  Vitals:   06/15/16 2106 06/16/16 0447  BP: (!) 152/64 (!) 151/42  Pulse: 74 (!) 54  Resp:  18  Temp:  98.2 F (36.8 C)    Physical Exam: Lungs:  Non labored Incisions:  Right groin with minimal granulation tissue; mild bloody drainage. Extremities:  Bilateral feet are warm; left great toe with dry gangrene on tip; right foot dorsal wound healing   CBC    Component Value Date/Time   WBC 11.0 (H) 06/14/2016 1419   RBC 3.10 (L) 06/14/2016 1419   HGB 8.8 (L) 06/14/2016 1419   HCT 28.0 (L) 06/14/2016 1419   HCT 37.0 04/14/2016 1431   PLT 278 06/14/2016 1419   PLT 326 04/14/2016 1431   MCV 90.3 06/14/2016 1419   MCV 88 04/14/2016 1431   MCH 28.4 06/14/2016 1419   MCHC 31.4 06/14/2016 1419   RDW 13.2 06/14/2016 1419   RDW 13.1 04/14/2016 1431   LYMPHSABS 1.9 05/25/2016 0349   MONOABS 0.3 05/25/2016 0349   EOSABS 0.1 05/25/2016 0349   BASOSABS 0.0 05/25/2016 0349    BMET    Component Value Date/Time   NA 142 06/14/2016 0856   NA 140 04/14/2016 1431   K 3.5 06/14/2016 0856   CL 105 06/14/2016 0856   CO2 25 06/14/2016 0856   GLUCOSE 134 (H) 06/14/2016 0856   BUN 24 (H) 06/14/2016 0856   BUN 19 04/14/2016 1431   CREATININE 0.76 06/14/2016 0856   CALCIUM 8.9 06/14/2016 0856   GFRNONAA >60 06/14/2016 0856   GFRAA >60 06/14/2016 0856    INR    Component Value Date/Time   INR 1.10 06/12/2016 1909     Intake/Output Summary (Last 24 hours) at 06/16/16 0743 Last data filed at 06/16/16 0300  Gross per 24 hour  Intake             1370 ml  Output             1281 ml  Net               89 ml     Assessment:  66 y.o. female is s/p:  with severely depressed ABIs bilaterally known bilateral SFA occlusions reason underwent right femoral to below-knee popliteal artery bypass with graft.   2 Days Post-Op and 1.  Right  common femoral endarterectomy 2.  Right common femoral to below knee popliteal artery bypass graft with 6mm ringed propatent 16 Days Post-Op  Plan: -wound vac changed today-no graft is exposed & is clean & does not appear infected. continue wound vac and IV abx. -bilateral feet are warm. -right great toe dry gangrene -will order WOC for vac changes T/T/S to start on 06/18/16 -continue asa/plavix -DVT prophylaxis:  SQ heparin   Doreatha MassedSamantha Rhyne, PA-C [facility] [phone] 06/16/2016 7:43 AM   Right groin some granulation.  Wound clean.  No purulent drainage.  Left first toe dry gangrene unchanged left tibial ulcer clean.  Continue VAC and IV abx.  Fabienne Brunsharles Fields, MD [facility] [place] Office: [phone] Pager: [phone]

## 2016-06-17 LAB — GLUCOSE, CAPILLARY
GLUCOSE-CAPILLARY: 71 mg/dL (ref 65–99)
Glucose-Capillary: 87 mg/dL (ref 65–99)
Glucose-Capillary: 129 mg/dL — ABNORMAL HIGH (ref 65–99)
Glucose-Capillary: 89 mg/dL (ref 65–99)

## 2016-06-17 LAB — CULTURE, BLOOD (ROUTINE X 2)
Culture: NO GROWTH
Culture: NO GROWTH
SPECIAL REQUESTS: ADEQUATE
Special Requests: ADEQUATE

## 2016-06-17 MED ORDER — MAGNESIUM CITRATE PO SOLN
300.0000 mL | Freq: Once | ORAL | Status: AC
  Administered 2016-06-17: 296 mL via ORAL
  Filled 2016-06-17: qty 592

## 2016-06-17 NOTE — Progress Notes (Signed)
[facility] [place]  Subjective  - feels ok, constipated, minimal pain left foot   Objective (!) 165/44 (!) 59 98 F (36.7 C) (Oral) 18 96%  Intake/Output Summary (Last 24 hours) at 06/17/16 0734 Last data filed at 06/16/16 2122  Gross per 24 hour  Intake             1400 ml  Output             1100 ml  Net              300 ml   Left first toe dry gangrene unchanged, left pretibial ulcer some granulation Right foot ulcer almost healed Right groin VAC in place serosanguinous drainage  Assessment/Planning: Leukocytosis resolved. Still high risk for graft infection but overall improved. Continue I V antibiotics for 10 day course then reassess Keep VAC possible d/c early next week  Constipation Rx  Andrea [name], Andrea [name] 06/17/2016 7:34 AM --  Laboratory Lab Results:  Recent Labs  06/14/16 1419 06/16/16 1931  WBC 11.0* 9.7  HGB 8.8* 8.6*  HCT 28.0* 27.4*  PLT 278 265   BMET  Recent Labs  06/14/16 0856 06/16/16 1931  NA 142 138  K 3.5 3.8  CL 105 103  CO2 25 24  GLUCOSE 134* 165*  BUN 24* 9  CREATININE 0.76 0.79  CALCIUM 8.9 8.3*    COAG Lab Results  Component Value Date   INR 1.10 06/12/2016   INR 0.93 05/13/2016   INR 0.9 04/14/2016   No results found for: PTT

## 2016-06-18 LAB — GLUCOSE, CAPILLARY
GLUCOSE-CAPILLARY: 61 mg/dL — AB (ref 65–99)
GLUCOSE-CAPILLARY: 138 mg/dL — AB (ref 65–99)
GLUCOSE-CAPILLARY: 120 mg/dL — AB (ref 65–99)
Glucose-Capillary: 59 mg/dL — ABNORMAL LOW (ref 65–99)
Glucose-Capillary: 62 mg/dL — ABNORMAL LOW (ref 65–99)
Glucose-Capillary: 63 mg/dL — ABNORMAL LOW (ref 65–99)
Glucose-Capillary: 182 mg/dL — ABNORMAL HIGH (ref 65–99)
Glucose-Capillary: 130 mg/dL — ABNORMAL HIGH (ref 65–99)

## 2016-06-18 MED ORDER — DEXTROSE 50 % IV SOLN
INTRAVENOUS | Status: AC
  Administered 2016-06-18: 50 mL
  Filled 2016-06-18: qty 50

## 2016-06-18 NOTE — Progress Notes (Signed)
Inpatient Diabetes Program Recommendations  AACE/ADA: New Consensus Statement on Inpatient Glycemic Control (2015)  Target Ranges:  Prepandial:   less than 140 mg/dL      Peak postprandial:   less than 180 mg/dL (1-2 hours)      Critically ill patients:  140 - 180 mg/dL   Lab Results  Component Value Date   GLUCAP 182 (H) 06/18/2016   HGBA1C 8.5 (H) 05/21/2016    Review of Glycemic Control Results for Andrea [name], Andrea [name] (MRN [id number]) as of 06/18/2016 10:55  Ref. Range 06/18/2016 07:18 06/18/2016 08:02  Glucose-Capillary Latest Ref Range: 65 - 99 mg/dL 62 (L) 962182 (H)   Diabetes history: DM2 Outpatient Diabetes medications: Lantus 30 units qd + Metformin 1 gm bid Current orders for Inpatient glycemic control: Lantus 30 units qd + Metformin 1 gm bid  Inpatient Diabetes Program Recommendations:    Noted hypoglycemia.  Please consider: -Decrease Lantus to 20 units  Thank you, Darel HongJudy E. Batya Citron, RN, MSN, CDE  Diabetes Coordinator Inpatient Glycemic Control Team Team Pager [phone] (8am-5pm) 06/18/2016 10:58 AM

## 2016-06-18 NOTE — Progress Notes (Addendum)
Assumed care from off going RN @ [phone]; patient alert & oriented; patient denies pain; patient sitting on side of bed eating breakfast; bed in lowest position with call bell in reach  @1553  patient assisted to bathroom and noticed bleeding from wound vac site; vascular assessment completed and noticed a 1+ hematoma  But no active bleeding; @ 1556 paged Sam, [place] and Dr. Claudie Fishermanhin came by to assess patient. @1615  Removed wound vac and apply a wet to dry dressing per Dr. Claudie Fishermanhin verbally order; patient up in bed; doppler all pulses right and left femoral, pedal pulse all +2 pulses. @1735  patient eating dinner with family at bedside; no acute distress; call bell in reach

## 2016-06-18 NOTE — Progress Notes (Addendum)
  Progress Note    06/18/2016 7:18 AM 4 Days Post-Op  Subjective:  "My feet are feeling better--they feel more like feet today".  Says she is eating ice cream bc her blood sugar is low.   Afebrile HR  40's-50's  140's-150's systolic 94% RA  Vitals:   06/17/16 2002 06/18/16 0539  BP: (!) 151/45 (!) 146/35  Pulse: (!) 55 (!) 49  Resp: 18 18  Temp: 98.8 F (37.1 C) 97.4 F (36.3 C)    Physical Exam: Lungs:  Non labored Extremities:  Left great toe is unchanged:  See below:     Right foot dorsal wound improving: See below:   Right groin wound about the same with mild bloody drainage:  See below:      CBC    Component Value Date/Time   WBC 9.7 06/16/2016 1931   RBC 2.95 (L) 06/16/2016 1931   HGB 8.6 (L) 06/16/2016 1931   HGB 12.6 04/14/2016 1431   HCT 27.4 (L) 06/16/2016 1931   HCT 37.0 04/14/2016 1431   PLT 265 06/16/2016 1931   PLT 326 04/14/2016 1431   MCV 92.9 06/16/2016 1931   MCV 88 04/14/2016 1431   MCH 29.2 06/16/2016 1931   MCHC 31.4 06/16/2016 1931   RDW 13.3 06/16/2016 1931   RDW 13.1 04/14/2016 1431   LYMPHSABS 1.9 05/25/2016 0349   MONOABS 0.3 05/25/2016 0349   EOSABS 0.1 05/25/2016 0349   BASOSABS 0.0 05/25/2016 0349    BMET    Component Value Date/Time   NA 138 06/16/2016 1931   NA 140 04/14/2016 1431   K 3.8 06/16/2016 1931   CL 103 06/16/2016 1931   CO2 24 06/16/2016 1931   GLUCOSE 165 (H) 06/16/2016 1931   BUN 9 06/16/2016 1931   BUN 19 04/14/2016 1431   CREATININE 0.79 06/16/2016 1931   CALCIUM 8.3 (L) 06/16/2016 1931   GFRNONAA >60 06/16/2016 1931   GFRAA >60 06/16/2016 1931    INR    Component Value Date/Time   INR 1.10 06/12/2016 1909     Intake/Output Summary (Last 24 hours) at 06/18/16 0718 Last data filed at 06/18/16 0313  Gross per 24 hour  Intake              720 ml  Output             1800 ml  Net            -1080 ml     Assessment:  66 y.o. female is s/p:  1. Right common femoral  endarterectomy 2. Right common femoral to below knee popliteal artery bypass graft with 6mm ringed propatent 18 Days Post-Op  And Debridement right groin 4 Days Post-Op  Plan: -pt doing well this morning.  Left great toe is unchanged and right dorsum of foot is improved.   -right groin wound vac removed and looks about the same with mild bloody drainage.  Continue IV abx. -wound vac change once over the weekend. -DVT prophylaxis:  Heparin SQ   Doreatha MassedSamantha Rhyne, PA-C [facility] [phone] 06/18/2016 7:18 AM  Agree with above.  Afebrile.  Groin slowly granulating. Left foot stable. Continue IV antibiotics until 6/10 [facility]  Fabienne Brunsharles Slade [place], MD [facility] [place] Office: [phone] Pager: [phone]

## 2016-06-18 NOTE — Evaluation (Signed)
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 who D/Cd to SNF and returned 6/1 with Right groin wound infection s/p debridement 6/3 and VAC placement  Clinical Impression  Pt very pleasant and familiar from last admission who is eager to return home. Pt with decreased strength, gait and activity tolerance who will benefit from acute therapy to maximize mobility, function, gait and independence to return home alone. Pt reports she can sleep downstairs on couch if needed and declined attempting stairs today but will plan to attempt next date for return home.     Follow Up Recommendations Home health PT    Equipment Recommendations  None recommended by PT    Recommendations for Other Services       Precautions / Restrictions Precautions Precautions: Fall Precaution Comments: VAC Restrictions Weight Bearing Restrictions: No      Mobility  Bed Mobility Overal bed mobility: Modified Independent                Transfers Overall transfer level: Needs assistance   Transfers: Sit to/from Stand Sit to Stand: Min guard Stand pivot transfers: Supervision       General transfer comment: cues for hand placement  from bed, BSC  Ambulation/Gait Ambulation/Gait assistance: Min guard Ambulation Distance (Feet): 350 Feet Assistive device: Rolling walker (2 wheeled) Gait Pattern/deviations: Step-through pattern;Decreased stride length;Trunk flexed   Gait velocity interpretation: Below normal speed for age/gender General Gait Details: cues for sequence, posture, looking up and position in RW  Stairs Stairs:  (pt declined  attempting stairs today)          Wheelchair Mobility    Modified Rankin (Stroke Patients Only)       Balance Overall balance assessment: Needs assistance   Sitting balance-Leahy Scale: Good       Standing balance-Leahy Scale: Fair                               Pertinent Vitals/Pain Pain Assessment: No/denies pain    Home Living Family/patient expects to be discharged to:: Private residence Living Arrangements: Alone Available Help at Discharge: Family;Available PRN/intermittently Type of Home: Apartment (townhouse) Home Access: Level entry     Home Layout: Two level;Bed/bath upstairs;1/2 bath on main level Home Equipment: Cane - single point;Walker - 4 wheels      Prior Function Level of Independence: Independent with assistive device(s)         Comments: Uses SPC vs furniture walker for household distances and RW for community. Drives. Has bedroom and shower upstairs and sometimes sleeps downstairs on couch/recliner depending on how she feels. Children assist as needed.      Hand Dominance        Extremity/Trunk Assessment   Upper Extremity Assessment Upper Extremity Assessment: Overall WFL for tasks assessed    Lower Extremity Assessment Lower Extremity Assessment: Generalized weakness    Cervical / Trunk Assessment Cervical / Trunk Assessment: Kyphotic  Communication   Communication: HOH  Cognition Arousal/Alertness: Awake/alert Behavior During Therapy: WFL for tasks assessed/performed Overall Cognitive Status: Within Functional Limits for tasks assessed  General Comments      Exercises     Assessment/Plan    PT Assessment Patient needs continued PT services  PT Problem List Decreased strength;Decreased range of motion;Decreased activity tolerance;Decreased mobility;Decreased knowledge of use of DME       PT Treatment Interventions DME instruction;Gait  training;Functional mobility training;Therapeutic activities;Therapeutic exercise;Patient/family education;Stair training    PT Goals (Current goals can be found in the Care Plan section)  Acute Rehab PT Goals Patient Stated Goal: return home PT Goal Formulation: With patient Time For Goal Achievement: 07/02/16 Potential to Achieve Goals: Good    Frequency Min 3X/week   Barriers to discharge Decreased caregiver support      Co-evaluation               AM-PAC PT "6 Clicks" Daily Activity  Outcome Measure Difficulty turning over in bed (including adjusting bedclothes, sheets and blankets)?: None Difficulty moving from lying on back to sitting on the side of the bed? : None Difficulty sitting down on and standing up from a chair with arms (e.g., wheelchair, bedside commode, etc,.)?: A Little Help needed moving to and from a bed to chair (including a wheelchair)?: None Help needed walking in hospital room?: A Little Help needed climbing 3-5 steps with a railing? : A Little 6 Click Score: 21    End of Session Equipment Utilized During Treatment: Gait belt Activity Tolerance: Patient tolerated treatment well Patient left: in chair;with call bell/phone within reach;with chair alarm set Nurse Communication: Mobility status PT Visit Diagnosis: Difficulty in walking, not elsewhere classified (R26.2)    Time: 6962-9528 PT Time Calculation (min) (ACUTE ONLY): 24 min   Charges:   PT Evaluation $PT Eval Moderate Complexity: 1 Procedure PT Treatments $Gait Training: 8-22 mins   PT G Codes:        Delaney Meigs, PT [phone]   Mavryk Pino B Andrea [name] 06/18/2016, 11:49 AM

## 2016-06-18 NOTE — Progress Notes (Addendum)
Hypoglycemic Event  CBG: 61  Treatment: 15 GM carbohydrate snack  Symptoms: None  Follow-up CBG: Time:0641 CBG Result:59  Treatment: 15 GM carbohydrate snack  Symptoms: None  Follow-up CBG: Time: 0654 CBG Result:63  Treatment: 15 GM carbohydrate snack  Symptoms: None  Follow-up CBG: Time:0719 CBG Result:62  Treatment: D50  Possible Reasons for Event: Unknown    Gregor HamsWade, Copeland Lapier

## 2016-06-18 NOTE — Progress Notes (Signed)
   Daily Progress Note   Assessment/Planning:   POD #4 s/p Debridement of R groin s/p R fem-pop BPG   Wet-to-dry dressing for now  Possible replace VAC in AM  Dr. Darrick PennaFields will check on pt in AM   Subjective  - 4 Days Post-Op   Reported VAC clogging with clot, ?knot in R groin   Objective   Vitals:   06/17/16 2002 06/18/16 0539 06/18/16 1338 06/18/16 1956  BP: (!) 151/45 (!) 146/35 (!) 131/28   Pulse: (!) 55 (!) 49 (!) 55   Resp: 18 18 18    Temp: 98.8 F (37.1 C) 97.4 F (36.3 C) 99.1 F (37.3 C)   TempSrc: Oral Oral Oral   SpO2: 99% 94% 94% 95%  Weight:      Height:         Intake/Output Summary (Last 24 hours) at 06/18/16 2042 Last data filed at 06/18/16 0313  Gross per 24 hour  Intake                0 ml  Output              700 ml  Net             -700 ml    VASC Right groin with limited bleeding on dressing, no obviously pulsatile PSA on exam  NEURO DNVI    Laboratory   CBC CBC Latest Ref Rng & Units 06/16/2016 06/14/2016 06/14/2016  WBC 4.0 - 10.5 K/uL 9.7 11.0(H) 10.5  Hemoglobin 12.0 - 15.0 g/dL 0.9(W8.6(L) 1.1(B8.8(L) 1.4(N9.4(L)  Hematocrit 36.0 - 46.0 % 27.4(L) 28.0(L) 30.0(L)  Platelets 150 - 400 K/uL 265 278 302    BMET    Component Value Date/Time   NA 138 06/16/2016 1931   NA 140 04/14/2016 1431   K 3.8 06/16/2016 1931   CL 103 06/16/2016 1931   CO2 24 06/16/2016 1931   GLUCOSE 165 (H) 06/16/2016 1931   BUN 9 06/16/2016 1931   BUN 19 04/14/2016 1431   CREATININE 0.79 06/16/2016 1931   CALCIUM 8.3 (L) 06/16/2016 1931   GFRNONAA >60 06/16/2016 1931   GFRAA >60 06/16/2016 1931     Leonides SakeBrian Glendon Fiser, MD, FACS [facility] [place] Office: [phone] Pager: [phone]  06/18/2016, 8:42 PM

## 2016-06-19 LAB — CBC
HCT: 24.9 % — ABNORMAL LOW (ref 36.0–46.0)
Hemoglobin: 7.7 g/dL — ABNORMAL LOW (ref 12.0–15.0)
MCH: 28.3 pg (ref 26.0–34.0)
MCHC: 30.9 g/dL (ref 30.0–36.0)
MCV: 91.5 fL (ref 78.0–100.0)
PLATELETS: 300 10*3/uL (ref 150–400)
RBC: 2.72 MIL/uL — ABNORMAL LOW (ref 3.87–5.11)
RDW: 13.6 % (ref 11.5–15.5)
WBC: 9.9 10*3/uL (ref 4.0–10.5)

## 2016-06-19 LAB — BASIC METABOLIC PANEL
Anion gap: 8 (ref 5–15)
BUN: 12 mg/dL (ref 6–20)
CO2: 27 mmol/L (ref 22–32)
Calcium: 8.7 mg/dL — ABNORMAL LOW (ref 8.9–10.3)
Chloride: 105 mmol/L (ref 101–111)
Creatinine, Ser: 0.71 mg/dL (ref 0.44–1.00)
GFR calc Af Amer: >60 mL/min (ref >60)
Glucose, Bld: 79 mg/dL (ref 65–99)
Potassium: 3.9 mmol/L (ref 3.5–5.1)
SODIUM: 140 mmol/L (ref 135–145)

## 2016-06-19 LAB — VANCOMYCIN, TROUGH: VANCOMYCIN TR: 11 ug/mL — AB (ref 15–20)

## 2016-06-19 LAB — GLUCOSE, CAPILLARY
GLUCOSE-CAPILLARY: 120 mg/dL — AB (ref 65–99)
Glucose-Capillary: 74 mg/dL (ref 65–99)
Glucose-Capillary: 108 mg/dL — ABNORMAL HIGH (ref 65–99)
Glucose-Capillary: 106 mg/dL — ABNORMAL HIGH (ref 65–99)

## 2016-06-19 LAB — PREPARE RBC (CROSSMATCH)

## 2016-06-19 MED ORDER — SODIUM CHLORIDE 0.9 % IV SOLN
Freq: Once | INTRAVENOUS | Status: AC
  Administered 2016-06-19: 14:00:00 via INTRAVENOUS

## 2016-06-19 NOTE — Progress Notes (Signed)
Pt has increased bleeding from R Femoral surgical site, Brian L. Imogene Burnhen, MD paged to notify, Pt resting comfortably in bed, will report to day shift RN

## 2016-06-19 NOTE — Progress Notes (Signed)
Received Call back from Dr Fransisco HertzBrian L Chen, no new orders received, he states to continue with wet to dry dressing, reinforce dressing and Dr Janetta Horaharles E. Fields will follow up in AM.

## 2016-06-19 NOTE — Progress Notes (Addendum)
  Progress Note    06/19/2016 7:36 AM 5 Days Post-Op  Subjective:  Sleeping-awakes easily  Tm 99.3 now afebrile HR  60's-70's NSR 130's-140's systolic 100% RA  Vitals:   06/18/16 2043 06/19/16 0506  BP: (!) 145/46 (!) 134/39  Pulse: 72 70  Resp: 18 18  Temp: 99.3 F (37.4 C) 98.4 F (36.9 C)    Physical Exam: Lungs:  Non labored Incisions:  Below knee incision is healing nicely.  Dressing taken down from right groin-saturated with blood.  There is a mild steady bloody drainage from just proximal to the center of the wound. Extremities:  Bilateral feet unchanged.     CBC    Component Value Date/Time   WBC 9.9 06/19/2016 0338   RBC 2.72 (L) 06/19/2016 0338   HGB 7.7 (L) 06/19/2016 0338   HGB 12.6 04/14/2016 1431   HCT 24.9 (L) 06/19/2016 0338   HCT 37.0 04/14/2016 1431   PLT 300 06/19/2016 0338   PLT 326 04/14/2016 1431   MCV 91.5 06/19/2016 0338   MCV 88 04/14/2016 1431   MCH 28.3 06/19/2016 0338   MCHC 30.9 06/19/2016 0338   RDW 13.6 06/19/2016 0338   RDW 13.1 04/14/2016 1431   LYMPHSABS 1.9 05/25/2016 0349   MONOABS 0.3 05/25/2016 0349   EOSABS 0.1 05/25/2016 0349   BASOSABS 0.0 05/25/2016 0349    BMET    Component Value Date/Time   NA 140 06/19/2016 0338   NA 140 04/14/2016 1431   K 3.9 06/19/2016 0338   CL 105 06/19/2016 0338   CO2 27 06/19/2016 0338   GLUCOSE 79 06/19/2016 0338   BUN 12 06/19/2016 0338   BUN 19 04/14/2016 1431   CREATININE 0.71 06/19/2016 0338   CALCIUM 8.7 (L) 06/19/2016 0338   GFRNONAA >60 06/19/2016 0338   GFRAA >60 06/19/2016 0338    INR    Component Value Date/Time   INR 1.10 06/12/2016 1909     Intake/Output Summary (Last 24 hours) at 06/19/16 0736 Last data filed at 06/18/16 2000  Gross per 24 hour  Intake                0 ml  Output              350 ml  Net             -350 ml     Assessment:  66 y.o. female is s/p:  1. Right common femoral endarterectomy 2. Right common femoral to below knee  popliteal artery bypass graft with 6mm ringed propatent  5 Days Post-Op  Plan: -pt with some bleeding from right groin overnight-ABD dressing placed this morning at 0550 and saturated when I took it off this am.  Held gentle pressure for 10 minute and no more ooze.  Wet to dry dressing placed for now.   -DVT prophylaxis:  She did receive her SQ heparin this am at 0530-next dose scheduled at 1400-if she has more bloody ooze, will hold that dose, otherwise, will  -bilateral feet are unchanged from yesterday.   Doreatha MassedSamantha Rhyne, PA-C [facility] [phone] 06/19/2016 7:36 AM  Agree with above Skin edge ooze but no further bleeding this morning restart vac later today Walked with pt today Transfuse for acute blood loss anemia  Fabienne Brunsharles Fields, MD [facility] [place] Office: [phone] Pager: [phone]

## 2016-06-19 NOTE — Progress Notes (Signed)
Pharmacy Antibiotic Note  Andrea [name] is a 66 y.o. female admitted on 06/12/2016 with right groin wound after recent right femoral endarterectomy and femoral to below-knee popliteal artery bypass with graft.  Pharmacy has been consulted for vancomycin and Zosyn dosing.   Vancomycin trough is therapeutic at 11 (drawn ~1 hr late) on 1000 mg IV q24h. SCr remains stable. Cultures are negative. Plan is to stop antibiotics on 6/10.  Plan: Continue vancomycin 1000 mg IV q24h, goal trough 10-15 mcg/ml Continue Zosyn 3.375 g IV q8h (infused over 4 hrs) Monitor renal function and clinical course MD, please enter stop date for antibiotics   Height: 5\' 2"  (157.5 cm) Weight: 130 lb 1.6 oz (59 kg) IBW/kg (Calculated) : 50.1  Temp (24hrs), Avg:98.8 F (37.1 C), Min:98.4 F (36.9 C), Max:99 F (37.2 C)   Recent Labs Lab 06/13/16 0310 06/14/16 0125 06/14/16 0856 06/14/16 1419 06/16/16 1931 06/19/16 0338 06/19/16 2045  WBC 11.0* 12.8* 10.5 11.0* 9.7 9.9  --   CREATININE 1.08* 0.92 0.76  --  0.79 0.71  --   VANCOTROUGH  --   --   --   --  10*  --  11*    Estimated Creatinine Clearance: 56.9 mL/min (by C-G formula based on SCr of 0.71 mg/dL).    Allergies  Allergen Reactions  . Naproxen Itching  . Tramadol Hcl Itching    Loura BackJennifer Elk, PharmD, BCPS Clinical Pharmacist Phone for tonight [phone] Main pharmacy - [phone] 06/19/2016 10:02 PM

## 2016-06-19 NOTE — Progress Notes (Signed)
Pt has increased bleeding from Rt femoral surgical site,  Andrea AduEric Wilson MD paged at 06.24am to notify. Pt currently resting comfortably, will report off to day shift RN

## 2016-06-19 NOTE — Progress Notes (Signed)
Physical Therapy Treatment Patient Details Name: Andrea [name] B Schmierer MRN: [id number] DOB: 1950/11/21 Today's Date: 06/19/2016    History of Present Illness 66 year old female history of CABG has critical limb ischemia on the right with wounds as well as on the left and has high-grade stenosis of her left carotid artery, S/P right fem-below knee popliteal artery BPG and right fem endarterectomy on 05/21/16. who D/Cd to SNF and returned 6/1 with Right groin wound infection s/p debridement 6/3 and VAC placement    PT Comments    Pt reports difficult night and fatigue. Pt without dizziness or SOB throughout session. No bleeding or oozing from Right groin wound. Pt with increased activity tolerance and able to perform stairs with increased time. Will continue to follow and recommend daily ambulation.    Follow Up Recommendations  Home health PT     Equipment Recommendations  Rolling walker with 5" wheels    Recommendations for Other Services       Precautions / Restrictions Precautions Precautions: Fall Restrictions Weight Bearing Restrictions: No RLE Weight Bearing: Weight bearing as tolerated    Mobility  Bed Mobility Overal bed mobility: Modified Independent                Transfers Overall transfer level: Modified independent                  Ambulation/Gait Ambulation/Gait assistance: Min guard Ambulation Distance (Feet): 350 Feet Assistive device: Rolling walker (2 wheeled) Gait Pattern/deviations: Step-through pattern;Decreased stride length;Trunk flexed   Gait velocity interpretation: Below normal speed for age/gender General Gait Details: cues for sequence, posture, looking up and position in RW, pt with frequent tendency to be too posterior to RW   Stairs Stairs: Yes   Stair Management: Step to pattern;Sideways;One rail Right Number of Stairs: 8 General stair comments: cues for seqeunce and safety, no physical assist. pt plans to have 1 RW downstairs  and 1 upstairs  Wheelchair Mobility    Modified Rankin (Stroke Patients Only)       Balance Overall balance assessment: Needs assistance   Sitting balance-Leahy Scale: Good       Standing balance-Leahy Scale: Fair                              Cognition Arousal/Alertness: Awake/alert Behavior During Therapy: WFL for tasks assessed/performed Overall Cognitive Status: Within Functional Limits for tasks assessed                                        Exercises      General Comments        Pertinent Vitals/Pain Pain Score: 8  Pain Location: bil feet Pain Descriptors / Indicators: Aching;Sore Pain Intervention(s): Limited activity within patient's tolerance;Repositioned;Patient requesting pain meds-RN notified    Home Living                      Prior Function            PT Goals (current goals can now be found in the care plan section) Progress towards PT goals: Progressing toward goals    Frequency           PT Plan Current plan remains appropriate    Co-evaluation              AM-PAC PT "6 Clicks" Daily  Activity  Outcome Measure  Difficulty turning over in bed (including adjusting bedclothes, sheets and blankets)?: None Difficulty moving from lying on back to sitting on the side of the bed? : None Difficulty sitting down on and standing up from a chair with arms (e.g., wheelchair, bedside commode, etc,.)?: A Little Help needed moving to and from a bed to chair (including a wheelchair)?: None Help needed walking in hospital room?: A Little Help needed climbing 3-5 steps with a railing? : A Little 6 Click Score: 21    End of Session Equipment Utilized During Treatment: Gait belt Activity Tolerance: Patient tolerated treatment well Patient left: in bed;with call bell/phone within reach Nurse Communication: Mobility status PT Visit Diagnosis: Difficulty in walking, not elsewhere classified  (R26.2);Pain;Other abnormalities of gait and mobility (R26.89) Pain - Right/Left: Right Pain - part of body: Leg     Time: 1610-9604 PT Time Calculation (min) (ACUTE ONLY): 24 min  Charges:  $Gait Training: 23-37 mins                    G Codes:       Delaney Meigs, PT [phone]    Corazon Nickolas B Aragon Scarantino 06/19/2016, 11:14 AM

## 2016-06-20 LAB — BPAM RBC
BLOOD PRODUCT EXPIRATION DATE: 201806152359
Blood Product Expiration Date: 201806152359
ISSUE DATE / TIME: 201806081350
ISSUE DATE / TIME: 201806081657
UNIT TYPE AND RH: 600
Unit Type and Rh: 600

## 2016-06-20 LAB — TYPE AND SCREEN
ABO/RH(D): A NEG
ANTIBODY SCREEN: NEGATIVE
UNIT DIVISION: 0
Unit division: 0

## 2016-06-20 LAB — CBC
HCT: 31.9 % — ABNORMAL LOW (ref 36.0–46.0)
Hemoglobin: 10.2 g/dL — ABNORMAL LOW (ref 12.0–15.0)
MCH: 28.3 pg (ref 26.0–34.0)
MCHC: 32 g/dL (ref 30.0–36.0)
MCV: 88.4 fL (ref 78.0–100.0)
PLATELETS: 261 10*3/uL (ref 150–400)
RBC: 3.61 MIL/uL — AB (ref 3.87–5.11)
RDW: 15.2 % (ref 11.5–15.5)
WBC: 12 10*3/uL — ABNORMAL HIGH (ref 4.0–10.5)

## 2016-06-20 LAB — GLUCOSE, CAPILLARY
GLUCOSE-CAPILLARY: 180 mg/dL — AB (ref 65–99)
Glucose-Capillary: 93 mg/dL (ref 65–99)
Glucose-Capillary: 124 mg/dL — ABNORMAL HIGH (ref 65–99)
Glucose-Capillary: 128 mg/dL — ABNORMAL HIGH (ref 65–99)

## 2016-06-20 NOTE — Progress Notes (Addendum)
[facility] [place]  Subjective  - Comfortable tolerating breakfast.   Objective (!) 156/48 61 98.3 F (36.8 C) (Oral) 16 95%  Intake/Output Summary (Last 24 hours) at 06/20/16 0844 Last data filed at 06/20/16 0823  Gross per 24 hour  Intake             2857 ml  Output             1351 ml  Net             1506 ml   Right groin wound checked, no active bleeding  Will have wound vac placed today. Below knee incision healing well No change in foot appearance Lungs non labored breathing   Assessment/Planning: 66 y.o. female is s/p:  1. Right common femoral endarterectomy 2. Right common femoral to below knee popliteal artery bypass graft with 6mm ringed propatent And Debridement right groin   Replace wound vac per nursing today Continue IV antibiotics until 6/10  Thomasena EdisCOLLINS, [facility] 06/20/2016 8:44 AM --  Laboratory Lab Results:  Recent Labs  06/19/16 0338 06/20/16 0809  WBC 9.9 12.0*  HGB 7.7* 10.2*  HCT 24.9* 31.9*  PLT 300 261   BMET  Recent Labs  06/19/16 0338  NA 140  K 3.9  CL 105  CO2 27  GLUCOSE 79  BUN 12  CREATININE 0.71  CALCIUM 8.7*    COAG Lab Results  Component Value Date   INR 1.10 06/12/2016   INR 0.93 05/13/2016   INR 0.9 04/14/2016   No results found for: PTT  Would examined and dressing changed.  Will replace wound vac today.  Continue IV abx  Wells Cigi Bega

## 2016-06-21 LAB — GLUCOSE, CAPILLARY
GLUCOSE-CAPILLARY: 81 mg/dL (ref 65–99)
Glucose-Capillary: 167 mg/dL — ABNORMAL HIGH (ref 65–99)
Glucose-Capillary: 129 mg/dL — ABNORMAL HIGH (ref 65–99)
Glucose-Capillary: 140 mg/dL — ABNORMAL HIGH (ref 65–99)

## 2016-06-21 LAB — BASIC METABOLIC PANEL
Anion gap: 10 (ref 5–15)
BUN: 14 mg/dL (ref 6–20)
CO2: 26 mmol/L (ref 22–32)
Calcium: 8.7 mg/dL — ABNORMAL LOW (ref 8.9–10.3)
Chloride: 104 mmol/L (ref 101–111)
Creatinine, Ser: 0.77 mg/dL (ref 0.44–1.00)
Glucose, Bld: 86 mg/dL (ref 65–99)
POTASSIUM: 3.5 mmol/L (ref 3.5–5.1)
SODIUM: 140 mmol/L (ref 135–145)

## 2016-06-21 LAB — CBC
HCT: 31.3 % — ABNORMAL LOW (ref 36.0–46.0)
Hemoglobin: 9.9 g/dL — ABNORMAL LOW (ref 12.0–15.0)
MCH: 28.4 pg (ref 26.0–34.0)
MCHC: 31.6 g/dL (ref 30.0–36.0)
MCV: 89.7 fL (ref 78.0–100.0)
Platelets: 286 10*3/uL (ref 150–400)
RBC: 3.49 MIL/uL — AB (ref 3.87–5.11)
RDW: 15.5 % (ref 11.5–15.5)
WBC: 10.8 10*3/uL — ABNORMAL HIGH (ref 4.0–10.5)

## 2016-06-21 NOTE — Progress Notes (Addendum)
[facility] [place]  Subjective  - Doing OK, no new complaints.   Objective (!) 162/43 (!) 55 98.4 F (36.9 C) (Oral) 18 95%  Intake/Output Summary (Last 24 hours) at 06/21/16 0742 Last data filed at 06/21/16 0653  Gross per 24 hour  Intake              600 ml  Output              500 ml  Net              100 ml    Doppler right PT/DP/peroneal, left peroneal Foot and leg wounds without change Wound vac to right groin to suction Heart RRR Lungs non labored breathing  Assessment/Planning: 66 y.o.femaleis s/p:  1. Right common femoral endarterectomy 2. Right common femoral to below knee popliteal artery bypass graft with 6mm ringed propatent And Debridement right groin  IV antibiotics last dose today. WBC decreased to 10.8 Discharge planning home with wound vac   Clinton GallantCOLLINS, EMMA [facility] 06/21/2016 7:42 AM --  Laboratory Lab Results:  Recent Labs  06/20/16 0809 06/21/16 0347  WBC 12.0* 10.8*  HGB 10.2* 9.9*  HCT 31.9* 31.3*  PLT 261 286   BMET  Recent Labs  06/19/16 0338 06/21/16 0347  NA 140 140  K 3.9 3.5  CL 105 104  CO2 27 26  GLUCOSE 79 86  BUN 12 14  CREATININE 0.71 0.77  CALCIUM 8.7* 8.7*    COAG Lab Results  Component Value Date   INR 1.10 06/12/2016   INR 0.93 05/13/2016   INR 0.9 04/14/2016   No results found for: PTT  No further bleeding.  Wound vac replaced.  Anticipate d/c tomorrow.  Jerelyn CharlesWells Brbham

## 2016-06-22 ENCOUNTER — Telehealth: Payer: Self-pay | Admitting: Vascular Surgery

## 2016-06-22 LAB — GLUCOSE, CAPILLARY
Glucose-Capillary: 87 mg/dL (ref 65–99)
Glucose-Capillary: 153 mg/dL — ABNORMAL HIGH (ref 65–99)

## 2016-06-22 MED ORDER — LEVOFLOXACIN 500 MG PO TABS: 500.0000 mg | ORAL_TABLET | Freq: Every day | ORAL | 0 refills | Status: AC

## 2016-06-22 MED ORDER — HYDROCODONE-ACETAMINOPHEN 5-325 MG PO TABS: 1.0000 | ORAL_TABLET | ORAL | 0 refills | Status: DC | PRN

## 2016-06-22 NOTE — Hospital Discharge Follow-Up (Signed)
Transitional Care Clinic Care Coordination Note:  Admit date:  06/12/2016 Discharge date: TBD  Discharge Disposition: home Patient contact: # [phone] Emergency contact(s): She did not want to provide any other contacts.    She also said that if the caller doesn't have an ID she will not answer the phone.   This Case Manager reviewed patient's EMR and determined patient would benefit from post-discharge medical management and chronic care management services through the [facility]. Patient has a history of  CAD, HTN, DM,and PVD. She is s/p right femoral endarterectomy and femoral to below the knee popliteal bypass with graft on 05/21/16. She was admitted with a right groin wound infection and had a debridement of the wound on 06/12/16. She has 4 hospital admissions since 03/12/2016. This Case Manager met with patient to discuss the services and medical management that can be provided at the [facility]. Patient verbalized understanding and agreed to receive post-discharge care at the [facility].   Patient scheduled for Transitional Care appointment on 06/29/16 @ 1400.  She said that she did not want to have an appointment scheduled before her appointment on Friday, 06/26/16. Clinic information and appointment time provided to patient. Appointment information also placed on AVS.  Assessment:       Home Environment: lives alone, in 2 story home. No steps to enter but she said that she has a flight of stairs inside.        Support System: she said that she has 2 daughters and 1 sister in the area but they  work and are only able to provide limited support.        Level of functioning: independent       Home DME: she said none at present time but a RW and BSC are to be ordered at discharge from [facility]. She will also have a wound VAC from [facility]. She said that she has a glucometer at home.        Home care services: (services arranged prior to discharge or new services  after discharge) - [facility] services to be arranged        Transportation: She had been driving but understands she will not be able to drive after discharge. She said that no one can take her to her appointments. Explained that the [facility] can provide cab transportation to her appointment at [facility] but not to other appointments. Also explained that this CM will need to call her and confirm her appointment prior to arranging for the cab pick up.         Food/Nutrition: (ability to afford, access, use of any community resources); She receives $13/month of food stamps.         Medications: (ability to afford, access, compliance, Pharmacy used) uses [facility]. She said that her co-pays are usually $5-12 but she may have met her deductible already for this year.  She said that she is aware that she can put medication copays on an account at [facility] if she is not able to afford them.  She said that she is aware and already has a $500 bill.         Identified Barriers: transportation, lack of support in the community, outstanding medical bills        PCP (Name, office location, phone number): Dr Jarold Song  Patient Education: Explained the services provided at [facility].  She currently receives $1300/month in social security.  Arranged services:        Services communicated to Marvetta Gibbons, RN CM

## 2016-06-22 NOTE — Discharge Summary (Signed)
Discharge Summary    Andrea [name] 11-Apr-1950 66 y.o. female  [id number]  Admission Date: 06/12/2016  Discharge Date: 06/22/16  Physician: Juventino Slovakain, Brandon Christophe*  Admission Diagnosis: infecter rt groin bypass graft   HPI:   This is a 66 y.o. female follows up from recent right femoral endarterectomy and femoral to below-knee popliteal artery bypass with graft. In the immediate postoperative period she had fevers with unexplained origin. Her wounds on her right foot has continued to heal although she now has low-grade fevers again and is having wound breakdown her right groin. This was only recently noticed by her rehabilitation facility. She is actually discharged by rehabilitation facility today. She is not feeling systemically ill having tolerated diet up until now. She is not having chest pain. She does have progressive ischemia of her left great toe.Marland Kitchen.  Hospital Course:  The patient was admitted to the hospital and taken to the operating room on 06/14/2016 and underwent: Debridement right groin  Operative findings: #1 dry eschar debrided fat necrosis beneath this no exposed graft  The pt tolerated the procedure well and was transported to the PACU in good condition.  That same day, a cardiology consult was obtained for evaluation of Abnormal EKG, possible STEMI at the request of Dr. Darrick PennaFields.    During induction/early in anesthesia., She did have transient hypotension. ST changes were noted on telemetry. There was concern for possible ST segment changes concerning for ST elevation. Immediately postop she stated that she may have had some chest discomfort. An EKG was obtained. I personally reviewed EKGs. There was one obtained that showed isoelectric baseline with T-wave inversions noted in the V3 through V6 leads as was seen on prior ECG. A second EKG done a few seconds after devastated some baseline artifact and was interpreted by the computer as inferior ST elevation  myocardial infarction. When carefully visualizing this, there does not appear to be greater than 1 mm of ST elevation in the inferior leads. Code STEMI was initially called.  Now that she is more alert and awake, I questioned her and she did not have any chest discomfort. She was not complaining of any shortness of breath. She has a right groin drain in place. Code STEMI was canceled.  On POD 1, a vac dressing was placed to the right groin.  Troponin levels were normal and code STEMI was cancelled.  She is on asa/plavix daily.  She was transferred to the floor.  On POD 2, the right groin had some granulation and wound was clean without purulent drainage.  Left first toe dry gangrene unchanged left tibial ulcer clean.  By POD 3, Leukocytosis resolved. Still high risk for graft infection but overall improved. Continue I V antibiotics for 10 day course then reassess.   On POD 4, the groin is granulating slowly and left foot is stable.  Continuing IV abx until 6/10.  She did have some clotting of the vac and it was removed and a wet to dry dressing placed.    On POD 5, her dressing was removed and she continued to have an ooze.  Gentle manual pressure was held and the ooze stopped.  A wet to dry dressing was placed back on the wound.   She did have acute blood loss anemia and was transfused 2 units of PRBC's.   On POD 6, her hgb was improved.  There was no more bleeding and the vac was placed back on the wound.    On POD 7,  her WBC was down.  Last dose of IV abx.   On POD 8, the wound was inspected and had about 10% granulation tissue in groin but overall clean.  She will f/u with Dr. Randie Heinz in a week.    The remainder of the hospital course consisted of increasing mobilization and increasing intake of solids without difficulty.  CBC    Component Value Date/Time   WBC 10.8 (H) 06/21/2016 0347   RBC 3.49 (L) 06/21/2016 0347   HGB 9.9 (L) 06/21/2016 0347   HGB 12.6 04/14/2016 1431   HCT 31.3  (L) 06/21/2016 0347   HCT 37.0 04/14/2016 1431   PLT 286 06/21/2016 0347   PLT 326 04/14/2016 1431   MCV 89.7 06/21/2016 0347   MCV 88 04/14/2016 1431   MCH 28.4 06/21/2016 0347   MCHC 31.6 06/21/2016 0347   RDW 15.5 06/21/2016 0347   RDW 13.1 04/14/2016 1431   LYMPHSABS 1.9 05/25/2016 0349   MONOABS 0.3 05/25/2016 0349   EOSABS 0.1 05/25/2016 0349   BASOSABS 0.0 05/25/2016 0349    BMET    Component Value Date/Time   NA 140 06/21/2016 0347   NA 140 04/14/2016 1431   K 3.5 06/21/2016 0347   CL 104 06/21/2016 0347   CO2 26 06/21/2016 0347   GLUCOSE 86 06/21/2016 0347   BUN 14 06/21/2016 0347   BUN 19 04/14/2016 1431   CREATININE 0.77 06/21/2016 0347   CALCIUM 8.7 (L) 06/21/2016 0347   GFRNONAA >60 06/21/2016 0347   GFRAA >60 06/21/2016 0347      Discharge Instructions    Call MD for:  redness, tenderness, or signs of infection (pain, swelling, bleeding, redness, odor or green/yellow discharge around incision site)    Complete by:  As directed    Call MD for:  severe or increased pain, loss or decreased feeling  in affected limb(s)    Complete by:  As directed    Call MD for:  temperature >100.5    Complete by:  As directed    Driving Restrictions    Complete by:  As directed    No driving for 2 weeks   Lifting restrictions    Complete by:  As directed    No lifting for 3 weeks   Resume previous diet    Complete by:  As directed       Discharge Diagnosis:  infecter rt groin bypass graft  Secondary Diagnosis: Patient Active Problem List   Diagnosis Date Noted  . Wound infection 06/12/2016  . CAD (coronary artery disease) of artery bypass graft 04/15/2016  . Coronary artery disease involving coronary bypass graft of native heart with angina pectoris (HCC)   . Coronary artery disease involving native coronary artery of native heart without angina pectoris 04/07/2016  . PAD (peripheral artery disease) (HCC) 03/30/2016  . Diabetic foot ulcers (HCC) 03/30/2016    . Hyperlipidemia 03/30/2016  . Diabetic neuropathy (HCC) 02/24/2016  . Tobacco abuse 02/24/2016  . Overactive bladder 01/14/2016  . Diabetes mellitus without complication (HCC) 10/18/2006  . Essential hypertension 10/18/2006  . LOW BACK PAIN, CHRONIC 10/18/2006   Past Medical History:  Diagnosis Date  . Asthma   . Coronary artery disease   . Diabetes mellitus without complication (HCC)   . Dyspnea   . Fibromyalgia   . GERD (gastroesophageal reflux disease)   . High cholesterol   . Hypertension   . PONV (postoperative nausea and vomiting)   . PVD (peripheral vascular disease) (HCC)   .  Urinary frequency   . Urinary incontinence      Allergies as of 06/22/2016      Reactions   Naproxen Itching   Tramadol Hcl Itching      Medication List    STOP taking these medications   hydrochlorothiazide 25 MG tablet Commonly known as:  HYDRODIURIL   omeprazole 20 MG capsule Commonly known as:  PRILOSEC   oxyCODONE-acetaminophen 5-325 MG tablet Commonly known as:  PERCOCET/ROXICET     TAKE these medications   albuterol 108 (90 Base) MCG/ACT inhaler Commonly known as:  PROVENTIL HFA;VENTOLIN HFA Inhale 2 puffs into the lungs every 6 (six) hours as needed for wheezing or shortness of breath.   aspirin EC 81 MG tablet Take 1 tablet (81 mg total) by mouth daily.   atorvastatin 80 MG tablet Commonly known as:  LIPITOR Take 1 tablet (80 mg total) by mouth daily. What changed:  when to take this   carbamide peroxide 6.5 % OTIC solution Commonly known as:  DEBROX Place 5 drops into both ears daily as needed (wax buildup).   clopidogrel 75 MG tablet Commonly known as:  PLAVIX Take 1 tablet (75 mg total) by mouth daily with breakfast.   collagenase ointment Commonly known as:  SANTYL Apply 1 application topically daily. Apply to right groin topically every day for wound care   cyclobenzaprine 5 MG tablet Commonly known as:  FLEXERIL Take 5 mg by mouth every 6 (six) hours  as needed (pain).   Fluticasone-Salmeterol 100-50 MCG/DOSE Aepb Commonly known as:  ADVAIR Inhale 1 puff into the lungs 2 (two) times daily.   gabapentin 300 MG capsule Commonly known as:  NEURONTIN Take 600 mg by mouth 3 (three) times daily.   HYDROcodone-acetaminophen 5-325 MG tablet Commonly known as:  NORCO/VICODIN Take 1 tablet by mouth every 4 (four) hours as needed for moderate pain. What changed:  how much to take   Insulin Glargine 100 UNIT/ML Solostar Pen Commonly known as:  LANTUS SOLOSTAR Inject 30 Units into the skin daily at 10 pm. What changed:  when to take this   levofloxacin 500 MG tablet Commonly known as:  LEVAQUIN Take 1 tablet (500 mg total) by mouth daily.   lisinopril 10 MG tablet Commonly known as:  PRINIVIL,ZESTRIL Take 1 tablet (10 mg total) by mouth daily.   metFORMIN 1000 MG tablet Commonly known as:  GLUCOPHAGE Take 1 tablet (1,000 mg total) by mouth 2 (two) times daily. Restart 48 hrs after discharge. What changed:  additional instructions   metoprolol tartrate 25 MG tablet Commonly known as:  LOPRESSOR Take 0.5 tablets (12.5 mg total) by mouth 2 (two) times daily.   oxybutynin 5 MG tablet Commonly known as:  DITROPAN TAKE 1 TABLET BY MOUTH 2 TIMES DAILY.   pantoprazole 20 MG tablet Commonly known as:  PROTONIX Take 20 mg by mouth daily.   PHENERGAN 25 MG/ML injection Generic drug:  promethazine Inject 25 mg into the muscle every 6 (six) hours as needed for nausea or vomiting.            Durable Medical Equipment        Start     Ordered   06/22/16 1024  For home use only DME Walker rolling  Once    Comments:  Subsequent wound infection with wound vac in place.  Question:  Patient needs a walker to treat with the following condition  Answer:  S/P femoral-popliteal bypass surgery   06/22/16 1023   06/22/16 1012  For home use only DME 3 n 1  Once     06/22/16 1012   06/22/16 1012  For home use only DME Walker rolling  Once     Question:  Patient needs a walker to treat with the following condition  Answer:  Weakness   06/22/16 1012   06/22/16 0728  For home use only DME Negative pressure wound device  Once    Question Answer Comment  Frequency of dressing change 3 times per week   Length of need 3 Months   Dressing type Foam   Amount of suction 125 mm/Hg   Pressure application Continuous pressure   Supplies 10 canisters and 15 dressings per month for duration of therapy      06/22/16 0727   06/21/16 0749  For home use only DME Negative pressure wound device  Once    Question Answer Comment  Frequency of dressing change 3 times per week   Length of need 3 Months   Dressing type Foam   Amount of suction 125 mm/Hg   Pressure application Continuous pressure   Supplies 10 canisters and 15 dressings per month for duration of therapy      06/21/16 0748      Prescriptions given: 1.  Norco #30 No Refill 2.  Levaquin 500mg  bid #14 NR  Instructions: 1.  No driving for 2 weeks and while taking pain medication 2.  No heavy lifting x 3 weeks 3.  Wound vac change three times/week  Disposition: [facility]  Patient's condition: is Good  Follow up: 1. Dr. Randie Heinz in 1 week   Doreatha Massed, PA-C [facility] [phone] 06/22/2016  12:13 PM

## 2016-06-22 NOTE — Care Management Note (Signed)
Case Management Note Donn PieriniKristi Jahree Dermody RN, BSN Unit 2W-Case Manager [phone]  Patient Details  Name: Andrea Brookingoni Andrea [name] MRN: [id number] Date of Birth: 01/25/1950  Subjective/Objective:  Pt admitted with right groin wound infection s/p I&D with wound VAC applied                  Action/Plan: PTA pt lived at home- [facility] and d/c'd home within 24 hr of readmission. -  D/c plan for return home will have daughter to assist-  Will need HH orders for HHRN/PT- pt will also need home wound VAC. - order form has been placed on shadow chart for signature-  Expected Discharge Date:    06/22/16              Expected Discharge Plan:  Home w Home Health Services  In-House Referral:  Clinical Social Work  Discharge planning Services  CM Consult  Post Acute Care Choice:  Durable Medical Equipment, Home Health Choice offered to:  Patient  DME Arranged:  Vac, 3-N-1, Walker rolling DME Agency:  [facility]  HH Arranged:  [facility]  [facility]  Status of Service:  Completed, signed off  If discussed at Long Length of Stay Meetings, dates discussed:    Discharge Disposition: home with home health   Additional Comments:  06/22/16- 1000- Keeley Sussman RN, CM-  Pt for d/c home today- wound VAC form has been signed and drsg changed by PA this am- [facility]- spoke with pt at bedside to [facility]- pt does not really have a preference- is ok with [facility] per MD note- call made to Tiffany with [facility] for referral- however- pt is not in-network for [facility] with her insurance- went back to pt to inform her and per pt [facility] are [facility]. Ref[facility]- they are in-network and referral accepted for HHRN/PT- pt also needs a RW and 3n1- [facility] to deliver DME to room prior to discharge- once home Wound VAC arranged-  [facility] to come to room to place pt on home wound VAC for discharge.   Darrold SpanWebster, Cantrell Larouche Hall, RN 06/22/2016, 10:25 AM

## 2016-06-22 NOTE — Progress Notes (Addendum)
  Progress Note    06/22/2016 7:28 AM 8 Days Post-Op  Subjective:  Very tearful again this morning; says she is worried about all the debt she is accumulating.   She is ready to go home.  Tm 99.1 HR 60's NSR 160's systolic 98% RA  Vitals:   06/21/16 2012 06/22/16 0520  BP: (!) 163/45 (!) 166/42  Pulse: 62 67  Resp: 18 18  Temp: 99.1 F (37.3 C) 98.3 F (36.8 C)    Physical Exam: Lungs:  Non labored Incisions:  Bilateral feet wounds are unchanged.  Wound vac with good seal. Extremities:  Bilateral feet are warm.   CBC    Component Value Date/Time   WBC 10.8 (H) 06/21/2016 0347   RBC 3.49 (L) 06/21/2016 0347   HGB 9.9 (L) 06/21/2016 0347   HGB 12.6 04/14/2016 1431   HCT 31.3 (L) 06/21/2016 0347   HCT 37.0 04/14/2016 1431   PLT 286 06/21/2016 0347   PLT 326 04/14/2016 1431   MCV 89.7 06/21/2016 0347   MCV 88 04/14/2016 1431   MCH 28.4 06/21/2016 0347   MCHC 31.6 06/21/2016 0347   RDW 15.5 06/21/2016 0347   RDW 13.1 04/14/2016 1431   LYMPHSABS 1.9 05/25/2016 0349   MONOABS 0.3 05/25/2016 0349   EOSABS 0.1 05/25/2016 0349   BASOSABS 0.0 05/25/2016 0349    BMET    Component Value Date/Time   NA 140 06/21/2016 0347   NA 140 04/14/2016 1431   K 3.5 06/21/2016 0347   CL 104 06/21/2016 0347   CO2 26 06/21/2016 0347   GLUCOSE 86 06/21/2016 0347   BUN 14 06/21/2016 0347   BUN 19 04/14/2016 1431   CREATININE 0.77 06/21/2016 0347   CALCIUM 8.7 (L) 06/21/2016 0347   GFRNONAA >60 06/21/2016 0347   GFRAA >60 06/21/2016 0347    INR    Component Value Date/Time   INR 1.10 06/12/2016 1909     Intake/Output Summary (Last 24 hours) at 06/22/16 0728 Last data filed at 06/22/16 0714  Gross per 24 hour  Intake                0 ml  Output             2220 ml  Net            -2220 ml     Assessment:  66 y.o. female is s/p:  1. Right common femoral endarterectomy 2. Right common femoral to below knee popliteal artery bypass graft with 6mm ringed propatent   5 Days Post-Op  8 Days Post-Op  Plan: -pt doing well this am-vac not changed this morning.  Will dw Dr. Darrick PennaFields about changing vac after OR.   -pt wants to dc home before !230 as her daughter has to be at work at [employer].   -IV abx finish today-will send home on po Levaquin -she will need to see Dr. Randie Heinzain back on Friday to check wound.   Doreatha MassedSamantha Rhyne, PA-C [facility] [phone] 06/22/2016 7:28 AM  Agree with above 10% granulation tissue in groin but overall clean Follow up Randie Heinzain later this week.  Fabienne Brunsharles Fina Heizer, MD [facility] [place] Office: [phone] Pager: [phone]

## 2016-06-22 NOTE — Telephone Encounter (Signed)
-----   Message from Sharee PimpleMarilyn K McChesney, RN sent at 06/22/2016  9:05 AM EDT ----- Regarding: Friday with Randie Heinzain   ----- Message ----- From: Dara Lordshyne, Samantha J, PA-C Sent: 06/22/2016   7:30 AM To: Vvs Charge Pool  S/p  1. Right common femoral endarterectomy 2. Right common femoral to below knee popliteal artery bypass graft with 6mm ringed propatent  5 Days Post-Op  Pt needs to f/u with Dr. Randie Heinzain this Friday 06/26/16 for wound check.  She also has a wound vac.    Thanks, Lelon MastSamantha

## 2016-06-22 NOTE — Progress Notes (Signed)
Order received to discharge patient.  Telemetry monitor removed and CCMD notified.  PIV access removed.  Discharge instructions, follow up, medications and instructions for their use discussed with patient. 

## 2016-06-22 NOTE — Telephone Encounter (Signed)
Sched appt 06/26/16 at 3:00. Lm on cell# for pt to confirm appt.

## 2016-06-22 NOTE — Progress Notes (Signed)
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 who D/Cd to SNF and returned 6/1 with Right groin wound infection s/p debridement 6/3 and VAC placement    PT Comments    Pt very pleasant, moving well with increased ambulation distance today and ability to walk with cane. Pt reports comfort with returning home and that daughters will assist periodically. Pt educated for DME use, gait progression and HEP.    Follow Up Recommendations  Home health PT     Equipment Recommendations  Rolling walker with 5" wheels;3in1 (PT)    Recommendations for Other Services       Precautions / Restrictions Precautions Precautions: Fall Precaution Comments: VAC    Mobility  Bed Mobility               General bed mobility comments: EOB on arrival  Transfers Overall transfer level: Modified independent                  Ambulation/Gait Ambulation/Gait assistance: Supervision Ambulation Distance (Feet): 450 Feet Assistive device: Rolling walker (2 wheeled);Straight cane Gait Pattern/deviations: Step-through pattern;Decreased stride length   Gait velocity interpretation: Below normal speed for age/gender General Gait Details: pt walked 400' with RW with cues for position in RW, posture and looking up. With use of cane last 50' pt with increased upright posture and good stability. Pt states she still feels more comfortable with RW for long distance   Stairs            Wheelchair Mobility    Modified Rankin (Stroke Patients Only)       Balance Overall balance assessment: Needs assistance   Sitting balance-Leahy  Scale: Good       Standing balance-Leahy Scale: Fair                              Cognition Arousal/Alertness: Awake/alert Behavior During Therapy: WFL for tasks assessed/performed Overall Cognitive Status: Within Functional Limits for tasks assessed                                        Exercises General Exercises - Lower Extremity Long Arc Quad: AROM;Both;Seated;20 reps Hip Flexion/Marching: AROM;Both;Seated;20 reps    General Comments        Pertinent Vitals/Pain Pain Assessment: No/denies pain    Home Living                      Prior Function            PT Goals (current goals can now be found in the care plan section) Progress towards PT goals: Progressing toward goals    Frequency           PT Plan Current plan remains appropriate    Co-evaluation              AM-PAC PT "6 Clicks" Daily Activity  Outcome Measure  Difficulty turning over in bed (including adjusting bedclothes, sheets and blankets)?: None Difficulty moving from lying on back to sitting  on the side of the bed? : None Difficulty sitting down on and standing up from a chair with arms (e.g., wheelchair, bedside commode, etc,.)?: A Little Help needed moving to and from a bed to chair (including a wheelchair)?: None Help needed walking in hospital room?: A Little Help needed climbing 3-5 steps with a railing? : A Little 6 Click Score: 21    End of Session Equipment Utilized During Treatment: Gait belt Activity Tolerance: Patient tolerated treatment well Patient left: in chair;with call bell/phone within reach Nurse Communication: Mobility status PT Visit Diagnosis: Difficulty in walking, not elsewhere classified (R26.2);Other abnormalities of gait and mobility (R26.89)     Time: [id number] PT Time Calculation (min) (ACUTE ONLY): 23 min  Charges:  $Gait Training: 8-22 mins $Therapeutic Exercise: 8-22 mins                    G Codes:        Delaney MeigsMaija Tabor Giovanna Kemmerer, PT [phone]   Amiel Mccaffrey B Kaysha Parsell 06/22/2016, 11:18 AM

## 2016-06-23 ENCOUNTER — Telehealth: Payer: Self-pay

## 2016-06-23 NOTE — Telephone Encounter (Addendum)
Transitional Care Clinic Post-discharge Follow-Up Phone Call:  Date of Discharge: 06/22/2016 Principal Discharge Diagnosis(es): s/p debridement of right groin wound with application of VAC, DM, CAD, HTN, PVD Post-discharge Communication: (Clearly document all attempts clearly and date contact made) call placed to the patient. Call Completed: Yes                    With Whom: Patient Interpreter Needed: No     Please check all that apply:  X  Patient is knowledgeable of his/her condition(s) and/or treatment. -She stated that she is feeling " good."  Her answers to questions were very brief - yes/no answers.  X  Patient is caring for self at home.  - She said that she has enough food at home.  ? Patient is receiving assist at home from family and/or caregiver. Family and/or caregiver is knowledgeable of patient's condition(s) and/or treatment. X  Patient is receiving home health services. If so, name of agency. - [facility]  at the time of discharge. The patient stated that she has not heard from anyone from the agency and then noted that her VAC keeps beeping and she is not sure what to do. Informed her that this CM would contact [facility] for her.  [facility] and [facility]. Informed her of the patient's report of the beeping VAC.  She noted that the patient was not scheduled to be seen yet as [facility] just prior to discharge yesterday. She said that she would have the nurse, Herbert SetaHeather, contact the patient to assess the situation and determine if a visit is necessary tonight.      Medication Reconciliation:  ? Medication list reviewed with patient. X  Patient obtained all discharge medications. If not, why? - She said that she did not pick up her medications yet and plans to pick them up tomorrow. This CM attempted to remind her of the importance of obtaining her antibiotic, and the patient stated " I said no, I don't have it." She did not want to  review her medication list.    Activities of Daily Living:  X  Independent ? Needs assist (describe; ? home DME used) ? Total Care (describe, ? home DME used)   Community resources in place for patient:  ? None  X  Home Health/Home DME - [facility] for RN and PT.  She also has a VAC and received a RW prior to discharge. She stated that she is not using the RW as it is not needed at home.  ? Assisted Living ? Support Group                Questions/Concerns discussed: [facility] 1400 and stated that this CM would call her on 06/26/16 to confirm the appointment and need for cab transportation to the clinic.   She stated that she had no questions or concerns at this time other than the beeping VAC.

## 2016-06-26 ENCOUNTER — Telehealth: Payer: Self-pay

## 2016-06-26 ENCOUNTER — Encounter: Payer: Self-pay | Admitting: Vascular Surgery

## 2016-06-26 ENCOUNTER — Ambulatory Visit (INDEPENDENT_AMBULATORY_CARE_PROVIDER_SITE_OTHER): Payer: Self-pay | Admitting: Physician Assistant

## 2016-06-26 VITALS — BP 135/53 | HR 67 | Temp 99.4°F | Resp 18 | Ht 62.0 in | Wt 138.7 lb

## 2016-06-26 DIAGNOSIS — T8189XD Other complications of procedures, not elsewhere classified, subsequent encounter: Secondary | ICD-10-CM

## 2016-06-26 DIAGNOSIS — I739 Peripheral vascular disease, unspecified: Secondary | ICD-10-CM

## 2016-06-26 NOTE — Telephone Encounter (Signed)
[facility], gave orders to start wound vac. Corrie DandyMary stated that someone would be out on Sunday to start wound vac care.

## 2016-06-26 NOTE — Progress Notes (Signed)
POST OPERATIVE OFFICE NOTE    CC:  F/u for surgery  HPI:  This is a 66 y.o. female who is s/p recent debridement ofdry eschar debrided fat necrosis beneath this no exposed graft and wound vac placement by Dr. Darrick Penna on 06/14/2016.  Previous right femoral endarterectomy and femoral to below-knee popliteal artery bypass with graft. In the immediate postoperative period she had fevers with unexplained origin. Her wounds on her right foot has continued to heal.  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  Plans once we get the right groin to heal we can proceed with left LE revascularization.    She has not smoked in 6 weeks.  She is on 81 mg aspirin, Plavix and Lipitor daily.    Allergies  Allergen Reactions  . Naproxen Itching  . Tramadol Hcl Itching    Current Outpatient Prescriptions  Medication Sig Dispense Refill  . albuterol (PROVENTIL HFA;VENTOLIN HFA) 108 (90 Base) MCG/ACT inhaler Inhale 2 puffs into the lungs every 6 (six) hours as needed for wheezing or shortness of breath. 54 Inhaler 3  . aspirin EC 81 MG tablet Take 1 tablet (81 mg total) by mouth daily. 90 tablet 3  . atorvastatin (LIPITOR) 80 MG tablet Take 1 tablet (80 mg total) by mouth daily. (Patient taking differently: Take 80 mg by mouth at bedtime. ) 90 tablet 3  . carbamide peroxide (DEBROX) 6.5 % otic solution Place 5 drops into both ears daily as needed (wax buildup).    . clopidogrel (PLAVIX) 75 MG tablet Take 1 tablet (75 mg total) by mouth daily with breakfast. 90 tablet 3  . collagenase (SANTYL) ointment Apply 1 application topically daily. Apply to right groin topically every day for wound care    . cyclobenzaprine (FLEXERIL) 5 MG tablet Take 5 mg by mouth every 6 (six) hours as needed (pain).   0  . Fluticasone-Salmeterol (ADVAIR) 100-50 MCG/DOSE AEPB Inhale 1 puff into the lungs 2 (two) times daily. 180 each 3  . gabapentin  (NEURONTIN) 300 MG capsule Take 600 mg by mouth 3 (three) times daily.   3  . HYDROcodone-acetaminophen (NORCO/VICODIN) 5-325 MG tablet Take 1 tablet by mouth every 4 (four) hours as needed for moderate pain. 30 tablet 0  . Insulin Glargine (LANTUS SOLOSTAR) 100 UNIT/ML Solostar Pen Inject 30 Units into the skin daily at 10 pm. (Patient taking differently: Inject 30 Units into the skin at bedtime. ) 30 mL 3  . levofloxacin (LEVAQUIN) 500 MG tablet Take 1 tablet (500 mg total) by mouth daily. 14 tablet 0  . lisinopril (PRINIVIL,ZESTRIL) 10 MG tablet Take 1 tablet (10 mg total) by mouth daily. 90 tablet 3  . metFORMIN (GLUCOPHAGE) 1000 MG tablet Take 1 tablet (1,000 mg total) by mouth 2 (two) times daily. Restart 48 hrs after discharge. (Patient taking differently: Take 1,000 mg by mouth 2 (two) times daily. ) 60 tablet 3  . metoprolol tartrate (LOPRESSOR) 25 MG tablet Take 0.5 tablets (12.5 mg total) by mouth 2 (two) times daily. 90 tablet 3  . oxybutynin (DITROPAN) 5 MG tablet TAKE 1 TABLET BY MOUTH 2 TIMES DAILY. 60 tablet 2  . pantoprazole (PROTONIX) 20 MG tablet Take 20 mg by mouth daily.  0  . promethazine (PHENERGAN) 25 MG/ML injection Inject 25 mg into the muscle every 6 (six) hours as needed for nausea or vomiting.     No current facility-administered  medications for this visit.      ROS:  See HPI  Physical Exam:  Vitals:   06/26/16 1524  BP: (!) 135/53  Pulse: 67  Resp: 18  Temp: 99.4 F (37.4 C)    Incision:  Right groin with yellow eschar and mixed minimal red base.  No visible by pass graft in wound bed.  Dorsum of foot superficial wound is healing.  Left great toe plantar dry gangrene.  Lateral leg superficial wound with out change. Extremities:  Doppler right PT/DP/peroneal, left peroneal   Assessment/Plan:  This is a 66 y.o. female who is s/p: 66 y.o.femaleis s/p:  1. Right common femoral endarterectomy 2. Right common femoral to below knee popliteal artery bypass  graft with 6mm ringed propatent And Debridement right groin  Left SFA occlusion left great toe dry gangrene, lateral leg superficial wound.   Right groin wet to dry dressing today in clinic.  HH to place wound vac today and then change 3 times per week. Protect feet with good shoes.  F/U in 2 weeks for wound check and further discussion of up coming left LE revascularization.     Clinton GallantOLLINS, Jhaniya Briski [facility] CenterMAUREEN PA-C [facility] [phone]  Clinic MD:  Randie Heinzain

## 2016-06-26 NOTE — Telephone Encounter (Signed)
Call placed to the patient to [facility] 1400 and to inquire about her need for transportation to the clinic.  She confirmed the appointment and said that her sister should be able to bring her to the clinic. In[facility]ed [facility] to [facility] the morning of the appointment if her sister is not able to bring her to the clinic and she doesn't have a ride.   She also noted that she has an appointment at the vascular surgeon this afternoon and has transportation to that appointment.  She then reported that her VAC has stopped working after it continued to beep. She said that she [facility] heard [facility] and [facility] dressing has not been changed. She stated that she will not answer calls from a unidentified number. Informed her that this CM will contact [facility].  [facility] and [facility] who stated that they have tried to contact her multiple times and left multiple message for the patient from the [facility] number and the patient has not returned any calls, so the patient has not been seen by a nurse.  Call placed to Dr Darcella Cheshireain's office # [phone] and spoke to [place].  Explained that the [facility] dressing has not been changed since discharge as the patient would not return calls to [facility] despite multiple calls and messages that were left for her. Also explained that the patient has stated that she will not answer calls from an unidentified number. Joyce GrossKay stated that they will not be changing the [facility]  dressing today. A wet to dry dressing may be applied and the VAC may even be discontinued.  She stated that the home health agency is responsible for applying the VAC dressing. Joyce GrossKay noted that she will speak to the patient about the importance of answering calls from [facility].   Call placed to [facility]. Informed her that the doctor today will not be changing the [facility] dressing, they will need to do that if it is to be continued.  Osvaldo HumanShayna stated that she would try again to contact the patient.

## 2016-06-29 ENCOUNTER — Telehealth: Payer: Self-pay

## 2016-06-29 ENCOUNTER — Inpatient Hospital Stay: Payer: Self-pay | Admitting: Family Medicine

## 2016-06-29 NOTE — Telephone Encounter (Signed)
Call received from the patient requesting a change in her appointment. She said that she can't get to the clinic today and did not want [facility] to arrange for a cab.  The appointment was re-scheduled for 07/01/16 @ 1400 and the patient stated that she will probably need a cab that day. Will confirm tomorrow.  The patient also stated that the [facility] has been out to see her and and the PT is coming today.

## 2016-07-01 ENCOUNTER — Ambulatory Visit: Payer: BLUE CROSS/BLUE SHIELD | Attending: Family Medicine | Admitting: Family Medicine

## 2016-07-01 ENCOUNTER — Encounter: Payer: Self-pay | Admitting: Family Medicine

## 2016-07-01 VITALS — BP 104/55 | HR 57 | Temp 98.7°F | Wt 137.8 lb

## 2016-07-01 DIAGNOSIS — E119 Type 2 diabetes mellitus without complications: Secondary | ICD-10-CM

## 2016-07-01 DIAGNOSIS — Z87891 Personal history of nicotine dependence: Secondary | ICD-10-CM | POA: Insufficient documentation

## 2016-07-01 DIAGNOSIS — Z7982 Long term (current) use of aspirin: Secondary | ICD-10-CM | POA: Diagnosis not present

## 2016-07-01 DIAGNOSIS — I10 Essential (primary) hypertension: Secondary | ICD-10-CM | POA: Diagnosis not present

## 2016-07-01 DIAGNOSIS — Z951 Presence of aortocoronary bypass graft: Secondary | ICD-10-CM | POA: Insufficient documentation

## 2016-07-01 DIAGNOSIS — E1142 Type 2 diabetes mellitus with diabetic polyneuropathy: Secondary | ICD-10-CM

## 2016-07-01 DIAGNOSIS — L089 Local infection of the skin and subcutaneous tissue, unspecified: Secondary | ICD-10-CM

## 2016-07-01 DIAGNOSIS — K219 Gastro-esophageal reflux disease without esophagitis: Secondary | ICD-10-CM

## 2016-07-01 DIAGNOSIS — I25700 Atherosclerosis of coronary artery bypass graft(s), unspecified, with unstable angina pectoris: Secondary | ICD-10-CM | POA: Diagnosis not present

## 2016-07-01 DIAGNOSIS — M797 Fibromyalgia: Secondary | ICD-10-CM | POA: Diagnosis not present

## 2016-07-01 DIAGNOSIS — Z955 Presence of coronary angioplasty implant and graft: Secondary | ICD-10-CM | POA: Insufficient documentation

## 2016-07-01 DIAGNOSIS — Z794 Long term (current) use of insulin: Secondary | ICD-10-CM | POA: Insufficient documentation

## 2016-07-01 DIAGNOSIS — E1151 Type 2 diabetes mellitus with diabetic peripheral angiopathy without gangrene: Secondary | ICD-10-CM | POA: Diagnosis not present

## 2016-07-01 DIAGNOSIS — J45909 Unspecified asthma, uncomplicated: Secondary | ICD-10-CM | POA: Insufficient documentation

## 2016-07-01 DIAGNOSIS — I25110 Atherosclerotic heart disease of native coronary artery with unstable angina pectoris: Secondary | ICD-10-CM | POA: Insufficient documentation

## 2016-07-01 DIAGNOSIS — F4323 Adjustment disorder with mixed anxiety and depressed mood: Secondary | ICD-10-CM

## 2016-07-01 DIAGNOSIS — T148XXA Other injury of unspecified body region, initial encounter: Secondary | ICD-10-CM

## 2016-07-01 DIAGNOSIS — E7800 Pure hypercholesterolemia, unspecified: Secondary | ICD-10-CM

## 2016-07-01 DIAGNOSIS — E1165 Type 2 diabetes mellitus with hyperglycemia: Secondary | ICD-10-CM | POA: Diagnosis present

## 2016-07-01 LAB — GLUCOSE, POCT (MANUAL RESULT ENTRY): POC Glucose: 87 mg/dl (ref 70–99)

## 2016-07-01 MED ORDER — INSULIN GLARGINE 100 UNIT/ML SOLOSTAR PEN: 30.0000 [IU] | PEN_INJECTOR | Freq: Every day | SUBCUTANEOUS | 5 refills | Status: DC

## 2016-07-01 MED ORDER — LISINOPRIL 5 MG PO TABS: 5.0000 mg | ORAL_TABLET | Freq: Every day | ORAL | 5 refills | Status: DC

## 2016-07-01 MED ORDER — PANTOPRAZOLE SODIUM 20 MG PO TBEC: 20.0000 mg | DELAYED_RELEASE_TABLET | Freq: Every day | ORAL | 1 refills | Status: DC

## 2016-07-01 MED ORDER — CITALOPRAM HYDROBROMIDE 20 MG PO TABS: 20.0000 mg | ORAL_TABLET | Freq: Every day | ORAL | 3 refills | Status: DC

## 2016-07-01 MED FILL — LISINOPRIL 5 MG TAB: 5 | 30 days supply | Qty: 30 | Fill #0

## 2016-07-01 MED FILL — CITALOPRAM HBR 20 MG TABLET: 20 | 30 days supply | Qty: 30 | Fill #0

## 2016-07-01 NOTE — Progress Notes (Signed)
Transitional care clinic  Hospitalization dates: 06/12/16 through 06/22/16  Date of telephone encounter: 06/22/16   Subjective:  Patient ID: Andrea [name], female    DOB: January 01, 1951  Age: 66 y.o. MRN: [id number]  CC: Follow-up   HPI [name] is a 66 year old female with a history of uncontrolled type 2 diabetes mellitus (A1c 8.5 down from 8.9 previously), hypertension,Coronary artery disease status post CABG in 2006, status post stent in 2000  peripheral vascular disease Who presents today at the transitional care and clinic for follow-up visit.  She was recently hospitalized for debridement of dry eschar debrided fat necrosis and wound vac placement by Dr. Darrick Penna on 06/14/2016.  She had a revious right femoral endarterectomy and femoral to below-knee popliteal artery bypass with graft on 05/21/16 Postoperative course was complicated by fevers of unknown origin and leukocytosis for which she received several antibiotics. Her condition improved and she was subsequently discharged with plans for elective left lower extremity revascularization in the future.  She has had a postop visit with vascular surgery recently and has home health care fall 1 VAC dressing changes 3 times a week; last dressing change was performed today. She is also undergoing outpatient physical therapy.   Today she endorses being depressed and has some anxiety due to her multiple underlying medical conditions and she is concerned that she might lose her toes and lower extremities. She quit smoking 6 months ago. Denies suicidal ideation or intent and she is teary-eyed.   Past Medical History:  Diagnosis Date  . Asthma   . Coronary artery disease   . Diabetes mellitus without complication (HCC)   . Dyspnea   . Fibromyalgia   . GERD (gastroesophageal reflux disease)   . High cholesterol   . Hypertension   . PONV (postoperative nausea and vomiting)   . PVD (peripheral vascular disease) (HCC)   . Urinary  frequency   . Urinary incontinence     Past Surgical History:  Procedure Laterality Date  . ABDOMINAL AORTOGRAM W/LOWER EXTREMITY N/A 03/25/2016   Procedure: Abdominal Aortogram w/Lower Extremity;  Surgeon: Maeola Harman, MD;  [facility] INVASIVE CV LAB;  Service: Cardiovascular;  Laterality: N/A;  . APPLICATION OF WOUND VAC Right 06/14/2016   Procedure: APPLICATION OF WOUND VAC;  Surgeon: Sherren Kerns, MD;  Location: [facility] OR;  Service: Vascular;  Laterality: Right;  . CORONARY ARTERY BYPASS GRAFT    . CORONARY STENT INTERVENTION N/A 04/15/2016   Procedure: Coronary Stent Intervention;  Surgeon: Lennette Bihari, MD;  Location: [facility] INVASIVE CV LAB;  Service: Cardiovascular;  Laterality: N/A;  . ENDARTERECTOMY FEMORAL Right 05/21/2016   Procedure: RIGHT FEMORAL ENDARTERECTOMY;  Surgeon: Maeola Harman, MD;  Location[facility] OR;  Service: Vascular;  Laterality: Right;  . FEMORAL-POPLITEAL BYPASS GRAFT Right 05/21/2016   Procedure: RIGHT FEMORAL-BELOW KNEE POPLITEAL ARTERY BYPASS GRAFT USING PROPATEN GORETEX GRAFT;  Surgeon: Maeola Harman, MD;  Location: [facility] OR;  Service: Vascular;  Laterality: Right;  . GROIN DEBRIDEMENT Right 06/14/2016   Procedure: GROIN DEBRIDEMENT;  Surgeon: Sherren Kerns, MD;  Location: [facility] OR;  Service: Vascular;  Laterality: Right;  . KNEE SURGERY    . LEFT HEART CATH AND CORS/GRAFTS ANGIOGRAPHY N/A 04/15/2016   Procedure: Left Heart Cath and Cors/Grafts Angiography;  Surgeon: Lennette Bihari, MD;  Location: [facility] INVASIVE CV LAB;  Service: Cardiovascular;  Laterality: N/A;  . MULTIPLE TOOTH EXTRACTIONS      Allergies  Allergen Reactions  . Naproxen Itching  .  Tramadol Hcl Itching     Outpatient Medications Prior to Visit  Medication Sig Dispense Refill  . albuterol (PROVENTIL HFA;VENTOLIN HFA) 108 (90 Base) MCG/ACT inhaler Inhale 2 puffs into the lungs every 6 (six) hours as needed for wheezing or shortness of breath. 54 Inhaler 3  .  aspirin EC 81 MG tablet Take 1 tablet (81 mg total) by mouth daily. 90 tablet 3  . atorvastatin (LIPITOR) 80 MG tablet Take 1 tablet (80 mg total) by mouth daily. (Patient taking differently: Take 80 mg by mouth at bedtime. ) 90 tablet 3  . carbamide peroxide (DEBROX) 6.5 % otic solution Place 5 drops into both ears daily as needed (wax buildup).    . clopidogrel (PLAVIX) 75 MG tablet Take 1 tablet (75 mg total) by mouth daily with breakfast. 90 tablet 3  . collagenase (SANTYL) ointment Apply 1 application topically daily. Apply to right groin topically every day for wound care    . cyclobenzaprine (FLEXERIL) 5 MG tablet Take 5 mg by mouth every 6 (six) hours as needed (pain).   0  . Fluticasone-Salmeterol (ADVAIR) 100-50 MCG/DOSE AEPB Inhale 1 puff into the lungs 2 (two) times daily. 180 each 3  . gabapentin (NEURONTIN) 300 MG capsule Take 600 mg by mouth 3 (three) times daily.   3  . HYDROcodone-acetaminophen (NORCO/VICODIN) 5-325 MG tablet Take 1 tablet by mouth every 4 (four) hours as needed for moderate pain. 30 tablet 0  . levofloxacin (LEVAQUIN) 500 MG tablet Take 1 tablet (500 mg total) by mouth daily. 14 tablet 0  . metFORMIN (GLUCOPHAGE) 1000 MG tablet Take 1 tablet (1,000 mg total) by mouth 2 (two) times daily. Restart 48 hrs after discharge. (Patient taking differently: Take 1,000 mg by mouth 2 (two) times daily. ) 60 tablet 3  . metoprolol tartrate (LOPRESSOR) 25 MG tablet Take 0.5 tablets (12.5 mg total) by mouth 2 (two) times daily. 90 tablet 3  . oxybutynin (DITROPAN) 5 MG tablet TAKE 1 TABLET BY MOUTH 2 TIMES DAILY. 60 tablet 2  . promethazine (PHENERGAN) 25 MG/ML injection Inject 25 mg into the muscle every 6 (six) hours as needed for nausea or vomiting.    . Insulin Glargine (LANTUS SOLOSTAR) 100 UNIT/ML Solostar Pen Inject 30 Units into the skin daily at 10 pm. (Patient taking differently: Inject 30 Units into the skin at bedtime. ) 30 mL 3  . lisinopril (PRINIVIL,ZESTRIL) 10 MG  tablet Take 1 tablet (10 mg total) by mouth daily. 90 tablet 3  . pantoprazole (PROTONIX) 20 MG tablet Take 20 mg by mouth daily.  0   No facility-administered medications prior to visit.     ROS Review of Systems  Constitutional: Negative for activity change, appetite change and fatigue.  HENT: Negative for congestion, sinus pressure and sore throat.   Eyes: Negative for visual disturbance.  Respiratory: Negative for cough, chest tightness, shortness of breath and wheezing.   Cardiovascular: Negative for chest pain and palpitations.  Gastrointestinal: Negative for abdominal distention, abdominal pain and constipation.  Endocrine: Negative for polydipsia.  Genitourinary: Negative for dysuria and frequency.  Musculoskeletal: Negative for arthralgias and back pain.  Skin: Positive for wound. Negative for rash.  Neurological: Positive for numbness. Negative for tremors and light-headedness.  Hematological: Does not bruise/bleed easily.  Psychiatric/Behavioral: Positive for dysphoric mood. Negative for agitation, behavioral problems and suicidal ideas.    Objective:  BP (!) 104/55   Pulse (!) 57   Temp 98.7 F (37.1 C) (Oral)   Wt  137 lb 12.8 oz (62.5 kg)   SpO2 97%   BMI 25.20 kg/m   BP/Weight 07/01/2016 06/26/2016 06/22/2016  Systolic BP 104 135 141  Diastolic BP 55 53 42  Wt. (Lbs) 137.8 138.7 -  BMI 25.2 25.37 -      Physical Exam Constitutional: She is oriented to person, place, and time. She appears well-developed and well-nourished.  HEENT: Slight maxillary sinus tenderness on the left, right TM occluded by cerumen, left TM normal, normal oropharynx Cardiovascular: Normal rate and normal heart sounds.   No murmur heard. Unable to palpate dorsalis pedis  or posterior tibial Pulmonary/Chest: Effort normal and breath sounds normal. She has no wheezes. She has no rales. She exhibits no tenderness.  Abdominal: Soft. Bowel sounds are normal. She exhibits no distension and  no mass. There is no tenderness.  Musculoskeletal: Normal range of motion. She exhibits no edema.  Neurological: She is alert and oriented to person, place, and time.  Skin: Wound VAC in right groin which is draining Psych: Depressed  CMP Latest Ref Rng & Units 06/21/2016 06/19/2016 06/16/2016  Glucose 65 - 99 mg/dL 86 79 161(W)  BUN 6 - 20 mg/dL 14 12 9   Creatinine 0.44 - 1.00 mg/dL 9.60 4.54 0.98  Sodium 135 - 145 mmol/L 140 140 138  Potassium 3.5 - 5.1 mmol/L 3.5 3.9 3.8  Chloride 101 - 111 mmol/L 104 105 103  CO2 22 - 32 mmol/L 26 27 24   Calcium 8.9 - 10.3 mg/dL 1.1(B) 1.4(N) 8.3(L)  Total Protein 6.5 - 8.1 g/dL - - -  Total Bilirubin 0.3 - 1.2 mg/dL - - -  Alkaline Phos 38 - 126 U/L - - -  AST 15 - 41 U/L - - -  ALT 14 - 54 U/L - - -    CBC    Component Value Date/Time   WBC 10.8 (H) 06/21/2016 0347   RBC 3.49 (L) 06/21/2016 0347   HGB 9.9 (L) 06/21/2016 0347   HGB 12.6 04/14/2016 1431   HCT 31.3 (L) 06/21/2016 0347   HCT 37.0 04/14/2016 1431   PLT 286 06/21/2016 0347   PLT 326 04/14/2016 1431   MCV 89.7 06/21/2016 0347   MCV 88 04/14/2016 1431   MCH 28.4 06/21/2016 0347   MCHC 31.6 06/21/2016 0347   RDW 15.5 06/21/2016 0347   RDW 13.1 04/14/2016 1431   LYMPHSABS 1.9 05/25/2016 0349   MONOABS 0.3 05/25/2016 0349   EOSABS 0.1 05/25/2016 0349   BASOSABS 0.0 05/25/2016 0349      Lab Results  Component Value Date   HGBA1C 8.5 (H) 05/21/2016    Assessment & Plan:   1. DM type 2 with diabetic peripheral neuropathy (HCC) A1c of 8.5 which is down from 8.9 previously No regimen changes, blood sugars are improving - POCT glucose (manual entry) - Insulin Glargine (LANTUS SOLOSTAR) 100 UNIT/ML Solostar Pen; Inject 30 Units into the skin at bedtime.  Dispense: 5 pen; Refill: 5  2. Wound infection Currently on Levaquin Wound vac in place; dressing change as per home care - CBC with Differential/Platelet  3. Pure hypercholesterolemia Low cholesterol diet Continue  statin  4. Essential hypertension Blood pressure is on the low side Reduced dose of lisinopril from 10 mg to 5 mg - lisinopril (PRINIVIL,ZESTRIL) 5 MG tablet; Take 1 tablet (5 mg total) by mouth daily.  Dispense: 30 tablet; Refill: 5  5. Coronary artery disease involving coronary bypass graft of native heart with unstable angina pectoris (HCC) Risk factor modification  Continue statin  6. Gastroesophageal reflux disease without esophagitis Stable - pantoprazole (PROTONIX) 20 MG tablet; Take 1 tablet (20 mg total) by mouth daily.  Dispense: 30 tablet; Refill: 1  7. Adjustment reaction with anxiety and depression Due to multiple underlying medical conditions Commenced on Celexa-discussed onset of action - citalopram (CELEXA) 20 MG tablet; Take 1 tablet (20 mg total) by mouth daily.  Dispense: 30 tablet; Refill: 3   Meds ordered this encounter  Medications  . Insulin Glargine (LANTUS SOLOSTAR) 100 UNIT/ML Solostar Pen    Sig: Inject 30 Units into the skin at bedtime.    Dispense:  5 pen    Refill:  5    Discontinue previous dose  . pantoprazole (PROTONIX) 20 MG tablet    Sig: Take 1 tablet (20 mg total) by mouth daily.    Dispense:  30 tablet    Refill:  1  . lisinopril (PRINIVIL,ZESTRIL) 5 MG tablet    Sig: Take 1 tablet (5 mg total) by mouth daily.    Dispense:  30 tablet    Refill:  5    Discontinue previous dose  . citalopram (CELEXA) 20 MG tablet    Sig: Take 1 tablet (20 mg total) by mouth daily.    Dispense:  30 tablet    Refill:  3    Follow-up: Return in about 2 weeks (around 07/15/2016) for TCC : Follow-up of chronic medical conditions.   Jaclyn Shaggy MD

## 2016-07-02 ENCOUNTER — Ambulatory Visit: Payer: Self-pay | Admitting: Family Medicine

## 2016-07-02 LAB — CBC WITH DIFFERENTIAL/PLATELET
BASOS ABS: 0 10*3/uL (ref 0.0–0.2)
BASOS: 0 %
EOS (ABSOLUTE): 0.4 10*3/uL (ref 0.0–0.4)
Eos: 3 %
HEMOGLOBIN: 10.8 g/dL — AB (ref 11.1–15.9)
Hematocrit: 32.9 % — ABNORMAL LOW (ref 34.0–46.6)
Immature Grans (Abs): 0 10*3/uL (ref 0.0–0.1)
Immature Granulocytes: 0 %
LYMPHS ABS: 3 10*3/uL (ref 0.7–3.1)
Lymphs: 24 %
MCH: 28.9 pg (ref 26.6–33.0)
MCHC: 32.8 g/dL (ref 31.5–35.7)
MCV: 88 fL (ref 79–97)
MONOCYTES: 5 %
Monocytes Absolute: 0.6 10*3/uL (ref 0.1–0.9)
NEUTROS ABS: 8.2 10*3/uL — AB (ref 1.4–7.0)
Neutrophils: 68 %
Platelets: 340 10*3/uL (ref 150–379)
RBC: 3.74 x10E6/uL — ABNORMAL LOW (ref 3.77–5.28)
RDW: 15.5 % — ABNORMAL HIGH (ref 12.3–15.4)
WBC: 12.2 10*3/uL — ABNORMAL HIGH (ref 3.4–10.8)

## 2016-07-10 ENCOUNTER — Other Ambulatory Visit: Payer: Self-pay

## 2016-07-10 ENCOUNTER — Ambulatory Visit (INDEPENDENT_AMBULATORY_CARE_PROVIDER_SITE_OTHER): Payer: Self-pay | Admitting: Vascular Surgery

## 2016-07-10 ENCOUNTER — Encounter: Payer: Self-pay | Admitting: Vascular Surgery

## 2016-07-10 VITALS — BP 118/42 | HR 85 | Temp 98.8°F | Resp 16 | Ht 62.0 in | Wt 135.0 lb

## 2016-07-10 DIAGNOSIS — I739 Peripheral vascular disease, unspecified: Secondary | ICD-10-CM

## 2016-07-10 DIAGNOSIS — T8189XD Other complications of procedures, not elsewhere classified, subsequent encounter: Secondary | ICD-10-CM

## 2016-07-10 NOTE — Progress Notes (Signed)
Patient ID: Andrea [name], female   DOB: 12/16/1950, 66 y.o.   MRN: [id number]  Reason for Consult: Re-evaluation   Referred by Jaclyn ShaggyAmao, Enobong, MD  Subjective:     HPI:  Andrea Brookingoni B Koelling is a 66 y.o. female for follow-up of right groin wound following right femoral to below-knee popliteal artery bypass. The bypass remains patent and she has strong signals on the right. Wounds on the right foot are healing of the foot is warm. 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 She is quit smoking. She does take Plavix as well as statin. She is now at home and his walking.  Past Medical History:  Diagnosis Date  . Asthma   . Coronary artery disease   . Diabetes mellitus without complication (HCC)   . Dyspnea   . Fibromyalgia   . GERD (gastroesophageal reflux disease)   . High cholesterol   . Hypertension   . PONV (postoperative nausea and vomiting)   . PVD (peripheral vascular disease) (HCC)   . Urinary frequency   . Urinary incontinence    Family History  Problem Relation Age of Onset  . Stroke Mother    Past Surgical History:  Procedure Laterality Date  . ABDOMINAL AORTOGRAM W/LOWER EXTREMITY N/A 03/25/2016   Procedure: Abdominal Aortogram w/Lower Extremity;  Surgeon: Maeola HarmanBrandon Christopher Lametria Klunk, MD;  [facility];  Service: Cardiovascular;  Laterality: N/A;  . APPLICATION OF WOUND VAC Right 06/14/2016   Procedure: APPLICATION OF WOUND VAC;  Surgeon: Sherren KernsFields, Charles E, MD;  [facility];  Service: Vascular;  Laterality: Right;  . CORONARY ARTERY BYPASS GRAFT    . CORONARY STENT INTERVENTION N/A 04/15/2016   Procedure: Coronary Stent Intervention;  Surgeon: Lennette Biharihomas A Kelly, MD;  [facility];  Service: Cardiovascular;  Laterality: N/A;  . ENDARTERECTOMY FEMORAL Right 05/21/2016   Procedure: RIGHT FEMORAL ENDARTERECTOMY;  Surgeon: Maeola Harmanain, Raylan Troiani Christopher, MD;  [facility];  Service: Vascular;  Laterality: Right;  . FEMORAL-POPLITEAL BYPASS GRAFT Right 05/21/2016   Procedure: RIGHT FEMORAL-BELOW KNEE POPLITEAL ARTERY BYPASS GRAFT USING PROPATEN GORETEX GRAFT;  Surgeon: Maeola Harmanain, Amrita Radu Christopher, MD;  [facility];  Service: Vascular;  Laterality: Right;  . GROIN DEBRIDEMENT Right 06/14/2016   Procedure: GROIN DEBRIDEMENT;  Surgeon: Sherren KernsFields, Charles E, MD;  [facility];  Service: Vascular;  Laterality: Right;  . KNEE SURGERY    . LEFT HEART CATH AND CORS/GRAFTS ANGIOGRAPHY N/A 04/15/2016   Procedure: Left Heart Cath and Cors/Grafts Angiography;  Surgeon: Lennette Biharihomas A Kelly, MD;  [facility];  Service: Cardiovascular;  Laterality: N/A;  . MULTIPLE TOOTH EXTRACTIONS      Short Social History:  Social History  Substance Use Topics  . Smoking status: Former Smoker    Packs/day: 0.25    Types: Cigarettes    Quit date: 04/04/2016  . Smokeless tobacco: Never Used  . Alcohol use No    Allergies  Allergen Reactions  . Naproxen Itching  . Tramadol Hcl Itching    Current Outpatient Prescriptions  Medication Sig Dispense Refill  . albuterol (PROVENTIL HFA;VENTOLIN HFA) 108 (90 Base) MCG/ACT inhaler Inhale 2 puffs into the lungs every 6 (six) hours as needed for wheezing or shortness of breath. 54 Inhaler 3  . aspirin EC 81 MG tablet Take 1 tablet (81 mg total) by mouth daily. 90 tablet 3  . atorvastatin (LIPITOR) 80 MG tablet  Take 1 tablet (80 mg total) by mouth daily. (Patient taking differently: Take 80 mg by mouth at bedtime. ) 90 tablet 3  . carbamide peroxide (DEBROX) 6.5 % otic solution Place 5 drops into both ears daily as needed (wax buildup).    . citalopram (CELEXA) 20 MG tablet Take 1 tablet (20 mg total) by mouth daily. 30 tablet 3  . clopidogrel (PLAVIX) 75 MG tablet Take 1 tablet (75 mg total) by mouth daily with breakfast. 90 tablet 3  . collagenase (SANTYL) ointment Apply 1 application topically daily. Apply to right groin  topically every day for wound care    . cyclobenzaprine (FLEXERIL) 5 MG tablet Take 5 mg by mouth every 6 (six) hours as needed (pain).   0  . Fluticasone-Salmeterol (ADVAIR) 100-50 MCG/DOSE AEPB Inhale 1 puff into the lungs 2 (two) times daily. 180 each 3  . gabapentin (NEURONTIN) 300 MG capsule Take 600 mg by mouth 3 (three) times daily.   3  . HYDROcodone-acetaminophen (NORCO/VICODIN) 5-325 MG tablet Take 1 tablet by mouth every 4 (four) hours as needed for moderate pain. 30 tablet 0  . Insulin Glargine (LANTUS SOLOSTAR) 100 UNIT/ML Solostar Pen Inject 30 Units into the skin at bedtime. 5 pen 5  . lisinopril (PRINIVIL,ZESTRIL) 5 MG tablet Take 1 tablet (5 mg total) by mouth daily. 30 tablet 5  . metFORMIN (GLUCOPHAGE) 1000 MG tablet Take 1 tablet (1,000 mg total) by mouth 2 (two) times daily. Restart 48 hrs after discharge. (Patient taking differently: Take 1,000 mg by mouth 2 (two) times daily. ) 60 tablet 3  . metoprolol tartrate (LOPRESSOR) 25 MG tablet Take 0.5 tablets (12.5 mg total) by mouth 2 (two) times daily. 90 tablet 3  . oxybutynin (DITROPAN) 5 MG tablet TAKE 1 TABLET BY MOUTH 2 TIMES DAILY. 60 tablet 2  . pantoprazole (PROTONIX) 20 MG tablet Take 1 tablet (20 mg total) by mouth daily. 30 tablet 1  . promethazine (PHENERGAN) 25 MG/ML injection Inject 25 mg into the muscle every 6 (six) hours as needed for nausea or vomiting.     No current facility-administered medications for this visit.     Review of Systems  Constitutional: Negative for chills and fever.  HENT: HENT negative.  Eyes: Eyes negative.  Cardiovascular: Cardiovascular negative.  GI: Gastrointestinal negative.  Musculoskeletal: Positive for leg pain.  Skin: Positive for wound.  Neurological: Neurological negative. Hematologic: Hematologic/lymphatic negative.  Psychiatric: Psychiatric negative.        Objective:  Objective   Vitals:   07/10/16 0853  BP: (!) 118/42  Pulse: 85  Resp: 16  Temp: 98.8 F  (37.1 C)  SpO2: 94%  Weight: 135 lb (61.2 kg)  Height: 5\' 2"  (1.575 m)   Body mass index is 24.69 kg/m.  Physical Exam  Constitutional: She appears well-developed.  HENT:  Head: Normocephalic.  Neck: Normal range of motion.  Cardiovascular: Normal rate.   Strong right pt signal Left monophasic dp  Pulmonary/Chest: Effort normal.  Abdominal: Soft. She exhibits no distension and no mass.  Musculoskeletal:  Right groin wound with granulation at base, good bleeding        Assessment/Plan:     66 year old female follows up from right femoral-to-popliteal artery bypass grafting with wound that is now healing with granulation at the base. This time I believe it's necessary to proceed with left femoral to below-knee popliteal artery bypass and which will also be with graft. We discussed risk and benefits again including wound infection that  she is well aware of as well as heart attack, stroke, death, and nonfunction of the graft. She will continue aspirin and Plavix will get her scheduled soon. She will likely need ampuration of the left first toe in the near future as well.     Maeola Harman MD [facility]

## 2016-07-14 ENCOUNTER — Telehealth: Payer: Self-pay

## 2016-07-14 NOTE — Telephone Encounter (Signed)
Call placed to the patient to check on her and remind her of her appointment with Dr Venetia NightAmao on 07/17/16 @ 1400.  She confirmed the appointment and said that she is able to drive now as long as she is not taking any pain medication.   She said that overall she is feeling " all right" and explained that she is scheduled for a left fem -pop bypass on 07/20/16.  She also reported  that her insurance company is no longer paying for her VAC and [facility] will need to remove it. She is not sure of the effective date.  She said that her surgeon is aware and explained to her that the bypass is a different procedure and the insurance may [facility]. She said that the cost for her would be $2700/month.   No other questions/concerns reported at this time.

## 2016-07-16 ENCOUNTER — Encounter
Admission: RE | Admit: 2016-07-16 | Discharge: 2016-07-16 | Disposition: A | Discharge status: Home / Self Care | Payer: BLUE CROSS/BLUE SHIELD | Source: Ambulatory Visit | Attending: Vascular Surgery | Admitting: Vascular Surgery

## 2016-07-16 ENCOUNTER — Encounter (HOSPITAL_COMMUNITY): Payer: Self-pay

## 2016-07-16 DIAGNOSIS — J45909 Unspecified asthma, uncomplicated: Secondary | ICD-10-CM | POA: Diagnosis not present

## 2016-07-16 DIAGNOSIS — E1140 Type 2 diabetes mellitus with diabetic neuropathy, unspecified: Secondary | ICD-10-CM | POA: Diagnosis not present

## 2016-07-16 DIAGNOSIS — Z951 Presence of aortocoronary bypass graft: Secondary | ICD-10-CM | POA: Insufficient documentation

## 2016-07-16 DIAGNOSIS — E1151 Type 2 diabetes mellitus with diabetic peripheral angiopathy without gangrene: Secondary | ICD-10-CM | POA: Diagnosis not present

## 2016-07-16 DIAGNOSIS — I25110 Atherosclerotic heart disease of native coronary artery with unstable angina pectoris: Secondary | ICD-10-CM | POA: Diagnosis not present

## 2016-07-16 DIAGNOSIS — M797 Fibromyalgia: Secondary | ICD-10-CM | POA: Diagnosis not present

## 2016-07-16 DIAGNOSIS — I739 Peripheral vascular disease, unspecified: Secondary | ICD-10-CM | POA: Diagnosis not present

## 2016-07-16 DIAGNOSIS — Z9889 Other specified postprocedural states: Secondary | ICD-10-CM | POA: Diagnosis not present

## 2016-07-16 DIAGNOSIS — K219 Gastro-esophageal reflux disease without esophagitis: Secondary | ICD-10-CM | POA: Diagnosis not present

## 2016-07-16 DIAGNOSIS — F4323 Adjustment disorder with mixed anxiety and depressed mood: Secondary | ICD-10-CM | POA: Insufficient documentation

## 2016-07-16 DIAGNOSIS — I10 Essential (primary) hypertension: Secondary | ICD-10-CM | POA: Diagnosis not present

## 2016-07-16 DIAGNOSIS — Z955 Presence of coronary angioplasty implant and graft: Secondary | ICD-10-CM | POA: Diagnosis not present

## 2016-07-16 DIAGNOSIS — E7800 Pure hypercholesterolemia, unspecified: Secondary | ICD-10-CM | POA: Insufficient documentation

## 2016-07-16 DIAGNOSIS — Z794 Long term (current) use of insulin: Secondary | ICD-10-CM | POA: Insufficient documentation

## 2016-07-16 LAB — COMPREHENSIVE METABOLIC PANEL
ALK PHOS: 66 U/L (ref 38–126)
ALT: 15 U/L (ref 14–54)
AST: 16 U/L (ref 15–41)
Albumin: 3.6 g/dL (ref 3.5–5.0)
Anion gap: 10 (ref 5–15)
BUN: 14 mg/dL (ref 6–20)
CALCIUM: 9.3 mg/dL (ref 8.9–10.3)
CO2: 26 mmol/L (ref 22–32)
CREATININE: 0.61 mg/dL (ref 0.44–1.00)
Chloride: 103 mmol/L (ref 101–111)
GFR calc non Af Amer: >60 mL/min (ref >60)
Glucose, Bld: 137 mg/dL — ABNORMAL HIGH (ref 65–99)
Potassium: 4.1 mmol/L (ref 3.5–5.1)
SODIUM: 139 mmol/L (ref 135–145)
Total Bilirubin: 0.5 mg/dL (ref 0.3–1.2)
Total Protein: 7 g/dL (ref 6.5–8.1)

## 2016-07-16 LAB — URINALYSIS, ROUTINE W REFLEX MICROSCOPIC
Bilirubin Urine: NEGATIVE
GLUCOSE, UA: NEGATIVE mg/dL
Hgb urine dipstick: NEGATIVE
Ketones, ur: NEGATIVE mg/dL
Nitrite: NEGATIVE
PROTEIN: NEGATIVE mg/dL
Specific Gravity, Urine: 1.011 (ref 1.005–1.030)
pH: 7 (ref 5.0–8.0)

## 2016-07-16 LAB — CBC
HCT: 35.6 % — ABNORMAL LOW (ref 36.0–46.0)
HEMOGLOBIN: 11.3 g/dL — AB (ref 12.0–15.0)
MCH: 28.8 pg (ref 26.0–34.0)
MCHC: 31.7 g/dL (ref 30.0–36.0)
MCV: 90.8 fL (ref 78.0–100.0)
Platelets: 256 10*3/uL (ref 150–400)
RBC: 3.92 MIL/uL (ref 3.87–5.11)
RDW: 14.5 % (ref 11.5–15.5)
WBC: 9.9 10*3/uL (ref 4.0–10.5)

## 2016-07-16 LAB — PROTIME-INR
INR: 0.93
Prothrombin Time: 12.5 seconds (ref 11.4–15.2)

## 2016-07-16 LAB — GLUCOSE, CAPILLARY: GLUCOSE-CAPILLARY: 145 mg/dL — AB (ref 65–99)

## 2016-07-16 LAB — APTT: aPTT: 26 seconds (ref 24–36)

## 2016-07-16 LAB — SURGICAL PCR SCREEN
MRSA, PCR: NEGATIVE
STAPHYLOCOCCUS AUREUS: POSITIVE — AB

## 2016-07-16 NOTE — Progress Notes (Addendum)
PCP is Dr. Jaclyn ShaggyEnobong Amao @ [facility]  LOV 07/01/2016 Cardio is Dr. Demetrius CharityP Nahser  LOV 04/2016 Echo on 04/2016  Stress test 04/2016  Heart cath 04/15/2016 A1C drawn 05/21/2016  8.5 Am blood sugars run from 120-146 She has been instructed by Dr. Randie Heinzain to continue her plavix & aspirin even on the day of surgery.  (I called to verify this info) Since she was here 3 month ago and has had blood transfusions, I called blood bank to get any other helpful info.  Will drawn a T & S the day of surgery.

## 2016-07-16 NOTE — Pre-Procedure Instructions (Addendum)
Andrea [name]  07/16/2016      [facility] [place], [place] - [address] [place] [address] Phone: [phone] Fax: [phone]    Your procedure is scheduled on  Monday, July 9th   Report to [facility] Admitting at 5:30 AM             (posted surgery time 7:30 - 11:15 am)   Call this number if you have problems the morning of surgery:  [phone], any other questions, call [phone] Mon-Fri from 8-4   Remember:   Do not eat food or drink liquids after midnight Sunday.               4-5 days prior to surgery, STOP TAKING any vitamins, herbal supplements, anti-inflammatories.   Take these medicines the morning of surgery with A SIP OF WATER : Celexa, Gabapentin, Hydrocodone, Metoprolol, Pantoprazole, Ditropan.  Please use your inhalers that morning.   Do not wear jewelry, make-up or nail polish.  Do not wear lotions, powders,  perfumes, or deoderant.  Do not shave 48 hours prior to surgery.     Do not bring valuables to the hospital.  [facility] is not responsible for any belongings or valuables.  Contacts, dentures or bridgework may not be worn into surgery.  Leave your suitcase in the car.  After surgery it may be brought to your room.  For patients admitted to the hospital, discharge time will be determined by your treatment team.   Please read over the following fact sheets that you were given. Pain Booklet, MRSA Information and Surgical Site Infection Prevention               How to Manage Your Diabetes Before and After Surgery  Why is it important to control my blood sugar before and after surgery? . Improving blood sugar levels before and after surgery helps healing and can limit problems. . A way of improving blood sugar control is eating a healthy diet by: o  Eating less sugar and carbohydrates o  Increasing activity/exercise o  Talking with your doctor about reaching your blood  sugar goals . High blood sugars (greater than 180 mg/dL) can raise your risk of infections and slow your recovery, so you will need to focus on controlling your diabetes during the weeks before surgery. . Make sure that the doctor who takes care of your diabetes knows about your planned surgery including the date and location.  How do I manage my blood sugar before surgery? . Check your blood sugar at least 4 times a day, starting 2 days before surgery, to make sure that the level is not too high or low. o Check your blood sugar the morning of your surgery when you wake up and every 2 hours until you get to the Short Stay unit. o  . If your blood sugar is less than 70 mg/dL, you will need to treat for low blood sugar: o Do not take insulin. o Treat a low blood sugar (less than 70 mg/dL) with  cup of clear juice (cranberry or apple), 4 glucose tablets, OR glucose gel. o  o Recheck blood sugar in 15 minutes after treatment (to make sure it is greater than 70 mg/dL). If your blood sugar is not greater than 70 mg/dL on recheck, call [phone] for further instructions. . Report your blood sugar to the short stay nurse when you get to Short Stay.  Marland Kitchen  If you are admitted to the hospital after surgery: o Your blood sugar will be checked by the staff and you will probably be given insulin after surgery (instead of oral diabetes medicines) to make sure you have good blood sugar levels. o The goal for blood sugar control after surgery is 80-180 mg/dL.    WHAT DO I DO ABOUT MY DIABETES MEDICATION?   Marland Kitchen. Do not take oral diabetes medicines (pills) the morning of surgery.  . THE NIGHT BEFORE SURGERY, take  5  units of  Lantus insulin.      . The day of surgery, do not take other diabetes injectables, including Byetta (exenatide), Bydureon (exenatide ER), Victoza (liraglutide), or Trulicity (dulaglutide).  . If your CBG is greater than 220 mg/dL, you may take  of your sliding scale (correction) dose  of insulin.  Other Instructions:          Patient Signature:  Date:   Nurse Signature:  Date:   Reviewed and Endorsed by [facility] Patient Education Committee, August 2015

## 2016-07-16 NOTE — Progress Notes (Signed)
Mupirocin Ointment called [facility] for positive PCR of Staph. Pt notified and then requested that I call Rx into [facility] [address], which I did.

## 2016-07-17 ENCOUNTER — Ambulatory Visit (HOSPITAL_BASED_OUTPATIENT_CLINIC_OR_DEPARTMENT_OTHER): Payer: BLUE CROSS/BLUE SHIELD | Admitting: Family Medicine

## 2016-07-17 ENCOUNTER — Encounter: Payer: Self-pay | Admitting: Family Medicine

## 2016-07-17 VITALS — BP 97/63 | HR 61 | Temp 99.1°F | Resp 18 | Ht 62.0 in | Wt 136.0 lb

## 2016-07-17 DIAGNOSIS — I25700 Atherosclerosis of coronary artery bypass graft(s), unspecified, with unstable angina pectoris: Secondary | ICD-10-CM

## 2016-07-17 DIAGNOSIS — I739 Peripheral vascular disease, unspecified: Secondary | ICD-10-CM | POA: Diagnosis not present

## 2016-07-17 DIAGNOSIS — E1140 Type 2 diabetes mellitus with diabetic neuropathy, unspecified: Secondary | ICD-10-CM | POA: Diagnosis not present

## 2016-07-17 DIAGNOSIS — K219 Gastro-esophageal reflux disease without esophagitis: Secondary | ICD-10-CM

## 2016-07-17 DIAGNOSIS — E1149 Type 2 diabetes mellitus with other diabetic neurological complication: Secondary | ICD-10-CM

## 2016-07-17 DIAGNOSIS — E119 Type 2 diabetes mellitus without complications: Secondary | ICD-10-CM | POA: Diagnosis not present

## 2016-07-17 DIAGNOSIS — I10 Essential (primary) hypertension: Secondary | ICD-10-CM

## 2016-07-17 DIAGNOSIS — F4323 Adjustment disorder with mixed anxiety and depressed mood: Secondary | ICD-10-CM

## 2016-07-17 LAB — GLUCOSE, POCT (MANUAL RESULT ENTRY): POC GLUCOSE: 119 mg/dL — AB (ref 70–99)

## 2016-07-17 MED ORDER — GABAPENTIN 300 MG PO CAPS: 600.0000 mg | ORAL_CAPSULE | Freq: Three times a day (TID) | ORAL | 3 refills | Status: DC

## 2016-07-17 MED FILL — GABAPENTIN 300 MG CAPSULE: 300 | 30 days supply | Qty: 180 | Fill #0

## 2016-07-17 NOTE — Progress Notes (Signed)
Subjective:    Patient ID: Andrea [name], female    DOB: 1950-12-19, 66 y.o.   MRN: [id number]  HPI DONELLE Andrea [name] is a 66 year old female with a history of uncontrolled type 2 diabetes mellitus (A1c 8.5 down from 8.9 previously), hypertension,Coronary artery disease status post CABG in 2006, status post stent in 2000  peripheral vascular disease Who presents today at the [facility] care and clinic for follow-up visit.  She was recently hospitalized for debridement of dry eschar debrided fat necrosis and wound vac placement by Dr. Darrick Penna on 06/14/2016.  She had a revious right femoral endarterectomy and femoral to below-knee popliteal artery bypass with graft on 05/21/16 Postoperative course was complicated by fevers of unknown origin and leukocytosis for which she received several antibiotics. Her condition improved and she was subsequently discharged with plans for elective left lower extremity revascularization in the future.  Her wound VAC was discontinued earlier on in the week and she has been performing daily dressing changes. She is scheduled for a bypass surgery in her left lower extremity next week.  She reports doing well on the Celexa which had been started due to anxiety and depression. Lisinopril had also been decreased from 10 mg to 5 mg due to hypotension. She has no additional concerns today.   Past Medical History:  Diagnosis Date  . Asthma   . Coronary artery disease   . Diabetes mellitus without complication (HCC)   . Dyspnea   . Fibromyalgia   . GERD (gastroesophageal reflux disease)   . High cholesterol   . Hypertension   . PONV (postoperative nausea and vomiting)   . PVD (peripheral vascular disease) (HCC)   . Urinary frequency   . Urinary incontinence     Past Surgical History:  Procedure Laterality Date  . ABDOMINAL AORTOGRAM W/LOWER EXTREMITY N/A 03/25/2016   Procedure: Abdominal Aortogram w/Lower Extremity;  Surgeon: Maeola Harman, MD;   [facility] CV LAB;  Service: Cardiovascular;  Laterality: N/A;  . APPLICATION OF WOUND VAC Right 06/14/2016   Procedure: APPLICATION OF WOUND VAC;  Surgeon: Sherren Kerns, MD;  Location: [facility] OR;  Service: Vascular;  Laterality: Right;  . CORONARY ARTERY BYPASS GRAFT    . CORONARY STENT INTERVENTION N/A 04/15/2016   Procedure: Coronary Stent Intervention;  Surgeon: Lennette Bihari, MD;  [facility];  Service: Cardiovascular;  Laterality: N/A;  . ENDARTERECTOMY FEMORAL Right 05/21/2016   Procedure: RIGHT FEMORAL ENDARTERECTOMY;  Surgeon: Maeola Harman, MD;  Location: [facility] OR;  Service: Vascular;  Laterality: Right;  . FEMORAL-POPLITEAL BYPASS GRAFT Right 05/21/2016   Procedure: RIGHT FEMORAL-BELOW KNEE POPLITEAL ARTERY BYPASS GRAFT USING PROPATEN GORETEX GRAFT;  Surgeon: Maeola Harman, MD;  Location: [facility] OR;  Service: Vascular;  Laterality: Right;  . GROIN DEBRIDEMENT Right 06/14/2016   Procedure: GROIN DEBRIDEMENT;  Surgeon: Sherren Kerns, MD;  Location: [facility] OR;  Service: Vascular;  Laterality: Right;  . KNEE SURGERY    . LEFT HEART CATH AND CORS/GRAFTS ANGIOGRAPHY N/A 04/15/2016   Procedure: Left Heart Cath and Cors/Grafts Angiography;  Surgeon: Lennette Bihari, MD;  [facility];  Service: Cardiovascular;  Laterality: N/A;  . MULTIPLE TOOTH EXTRACTIONS      Allergies  Allergen Reactions  . Naproxen Itching  . Tramadol Hcl Itching     Review of Systems Constitutional: Negative for activity change, appetite change and fatigue.  HENT: Negative for congestion, sinus pressure and sore throat.   Eyes: Negative  for visual disturbance.  Respiratory: Negative for cough, chest tightness, shortness of breath and wheezing.   Cardiovascular: Negative for chest pain and palpitations.  Gastrointestinal: Negative for abdominal distention, abdominal pain and constipation.  Endocrine: Negative for polydipsia.  Genitourinary: Negative for  dysuria and frequency.  Musculoskeletal: Negative for arthralgias and back pain.  Skin: Positive for wound. Negative for rash.  Neurological: Positive for numbness. Negative for tremors and light-headedness.  Hematological: Does not bruise/bleed easily.  Psychiatric/Behavioral: Negative for agitation, behavioral problems and suicidal ideas.      Objective: Vitals:   07/17/16 1404  BP: 97/63  Pulse: 61  Resp: 18  Temp: 99.1 F (37.3 C)  TempSrc: Oral  SpO2: 98%  Weight: 136 lb (61.7 kg)  Height: 5\' 2"  (1.575 m)      Physical Exam Constitutional: She is oriented to person, place, and time. She appears well-developed and well-nourished.  HEENT: Slight maxillary sinus tenderness on the left, right TM occluded by cerumen, left TM normal, normal oropharynx Cardiovascular: Normal rate and normal heart sounds.   No murmur heard. Unable to palpate dorsalis pedis  or posterior tibial Pulmonary/Chest: Effort normal and breath sounds normal. She has no wheezes. She has no rales. She exhibits no tenderness.  Abdominal: Soft. Bowel sounds are normal. She exhibits no distension and no mass. There is no tenderness.  Musculoskeletal: Normal range of motion. She exhibits no edema.  Neurological: She is alert and oriented to person, place, and time.  Skin: Ulcer in right groin with granulation tissue Psych: Normal       Assessment & Plan:  1. DM type 2 with diabetic peripheral neuropathy (HCC) A1c of 8.5 which is down from 8.9 previously No regimen changes, blood sugars are improving - POCT glucose (manual entry) - Insulin Glargine (LANTUS SOLOSTAR) 100 UNIT/ML Solostar Pen; Inject 30 Units into the skin at bedtime.  Dispense: 5 pen; Refill: 5  2. Peripheral Artery Disease / Wound infection Status post right femoral endarterectomy and femoral to below knee popliteal artery bypass with graft Completed Levaquin Dressing change performed in the clinic Scheduled for LE bypass next week. -  CBC with Differential/Platelet  3. Pure hypercholesterolemia Low cholesterol diet Continue statin  4. Essential hypertension Blood pressure is on the low side Reduced dose of lisinopril from 10 mg to 5 mg at last visit - Patient to ensure she is taking 5 mg and not 10mg  - lisinopril (PRINIVIL,ZESTRIL) 5 MG tablet; Take 1 tablet (5 mg total) by mouth daily.  Dispense: 30 tablet; Refill: 5  5. Coronary artery disease involving coronary bypass graft of native heart with unstable angina pectoris (HCC) Risk factor modification Continue statin  6. Gastroesophageal reflux disease without esophagitis Stable - pantoprazole (PROTONIX) 20 MG tablet; Take 1 tablet (20 mg total) by mouth daily.  Dispense: 30 tablet; Refill: 1  7. Adjustment reaction with anxiety and depression Due to multiple underlying medical conditions Continue Celexa - citalopram (CELEXA) 20 MG tablet; Take 1 tablet (20 mg total) by mouth daily.  Dispense: 30 tablet; Refill: 3

## 2016-07-17 NOTE — Addendum Note (Signed)
Addended by: Margaretmary LombardLISBON, NUBIA K on: 07/17/2016 03:34 PM   Modules accepted: Orders

## 2016-07-17 NOTE — Progress Notes (Signed)
Anesthesia chart review: Patient is a 66 year old female scheduled for left femoral to below-knee popliteal artery bypass on 07/20/16 by Dr. Randie Heinz. She is s/p right FPBG 05/21/16.   History includes PAD (BLE with non-healing right foot wound; s/p right CFA endarterectomy/FPBG with Emeline Darling Propaten 05/21/16, s/p right groin debridement/Wound VAC 06/14/16), former smoker (quit 04/04/16), CAD s/p stent '00 and CABG (LIMA-LAD, SVG-OM, SVG-PDA) '06 s/p Synergy stent SVG-PDA 04/15/16, post-operative N/V, fibromyalgia, DM2, HTN, GERD, asthma, dyspnea, hypercholesterolemia.   - PCP is Dr. Jaclyn Shaggy, last visit 07/01/16.  - Cardiologist is Dr. Kristeen Miss, last visit 04/29/16. He wrote, "Has vascular surgery scheduled on May 10. She will be only about a month out from her DES stenting but will need to hold the Plavix prior to her vascular surgery. This does put her at increased risk for subacute stent thrombosis but she is at risk of losing her legs and needs to have the vascular surgery. I will agree to have her hold her plavix for up to 5 days prior to her procedure and restart as soon as possible after the surgery." (Dr. Randie Heinz is keeping patient on ASA and Plavix.). Her last cardiology encounters were with  was 06/14/16 with Dr. Donato Schultz (06/14/16) and Dr. Chilton Si (06/15/16) for EKG changes during transient hypotension during her 06/14/16 wound debridement. Cardiology did not feel patient has having a STEMI and cardiac enzymes were negative X 3. EKG was felt stable. Continue cardiac medications, but otherwise no additional recommendations made.   Medications include albuterol, aspirin 81 mg, Lipitor, Celexa, Plavix, Flexeril, Advair, Neurontin, Lantus, lisinopril, metformin, Lopressor, Prontonix, Ditropan. Per VVS, patient to remain on both ASA and Plavix.   BP (!) 157/75   Pulse 70   Temp 37.3 C   Resp 18   Ht 5\' 2"  (1.575 m)   Wt 136 lb 1.6 oz (61.7 kg)   SpO2 99%   BMI 24.89 kg/m   EKG 6/03/18L  NSR, ST/T wave abnormality, consider anterolateral ischemia. No significant change since last tracing.  Cardiac cath 04/15/16 (Dr. Nicki Guadalajara; done due to anterior ischemia on 04/13/16 stress test): Conclusion:  Ost LAD to Prox LAD lesion, 95 %stenosed.  Prox LAD lesion, 100 %stenosed.  Ost Cx to Prox Cx lesion, 30 %stenosed.  Ost RCA lesion, 100 %stenosed.  LIMA and is normal in caliber.  Mid Cx lesion, 100 %stenosed.  A STENT SYNERGY DES 3.5X12 drug eluting stent was successfully placed.  Dist Graft lesion, 85% stenosed.  Post intervention, there is a 0% residual stenosis.  Ost RCA to Dist RCA lesion, 100 %stenosed.  The left ventricular ejection fraction is 50-55% by visual estimate.  Dist LAD lesion, 40 %stenosed. Severe native CAD with 95% very proximal stenosis of the LAD before small first diagonal branch with total occlusion of the LAD after this diagonal; patent stent at the ostium of the circumflex with in-stent narrowing of 30% with total occlusion of the OM vessel after a small AV groove circumflex in atrial branch; and total occlusion of the RCA at its origin. - Patent LIMA graft supplying the mid LAD with mild 40% narrowing in the mid distal LAD. - Patent SVG supplying the circumflex marginal vessel - SVG supplying the PDA has a focal 85% distal graft stenosis. - Low normal LV function with an ejection fraction of 50-55% with subtle mild upper mid anterolateral hypocontractility. - Successful PCI to the SVG supplying the PDA with ultimate insertion of a 3.512 mm Synergy stent postdilated to 3.78  mm with the 85% stenosis being reduced to 0%. RECOMMENDATION: The patient received a Synergy stent. She will require dual antiplatelet therapy with aspirin and Plavix. Prior to intervention, there was discussion with Dr. Randie Heinzain about deferring her planned vascular surgery for next week for minimum of a month, but ultimate decision regarding timing will be necessary.  Echo  04/13/16: Study Conclusions - Left ventricle: The cavity size was normal. Wall thickness was increased in a pattern of mild LVH. Systolic function was normal. The estimated ejection fraction was in the range of 55% to 60%. Wall motion was normal; there were no regional wall motion abnormalities. Features are consistent with a pseudonormal left ventricular filling pattern, with concomitant abnormal relaxation and increased filling pressure (grade 2 diastolic dysfunction). - Left atrium: The atrium was moderately dilated. - Atrial septum: No defect or patent foramen ovale was identified.   Carotid U/S 04/02/16: Impression: 1. Doppler velocities suggest 1-39% right proximal ICA stenosis. 2. Doppler velocities suggest 60-79% (highest in the range) left proximal ICA stenosis.  Preoperative labs noted. A1c 8.5 on 05/21/16 (down from 8.9). UA showed trace leukocytes, negative nitrites. She is for T&S on the day of surgery.  Patient is < 6 months out from PCI, but in April, Dr. Elease HashimotoNahser [facility] given limb threatening ischemia. Dr. Randie Heinzain is keeping patient on DAPT. If no acute changes then I would anticipate that she can proceed as planned.   Velna Ochsllison Essa Wenk, PA-C [facility]Anesthesiology Phone [phone] 07/17/2016 10:20 AM

## 2016-07-20 ENCOUNTER — Inpatient Hospital Stay (HOSPITAL_COMMUNITY): Payer: BLUE CROSS/BLUE SHIELD | Admitting: Emergency Medicine

## 2016-07-20 ENCOUNTER — Encounter (HOSPITAL_COMMUNITY)
Admission: RE | Disposition: A | Discharge status: Home / Self Care | Payer: Self-pay | Source: Ambulatory Visit | Attending: Vascular Surgery

## 2016-07-20 ENCOUNTER — Encounter (HOSPITAL_COMMUNITY): Payer: Self-pay | Admitting: Surgery

## 2016-07-20 ENCOUNTER — Inpatient Hospital Stay (HOSPITAL_COMMUNITY): Payer: BLUE CROSS/BLUE SHIELD | Admitting: Anesthesiology

## 2016-07-20 ENCOUNTER — Inpatient Hospital Stay (HOSPITAL_COMMUNITY)
Admission: RE | Admit: 2016-07-20 | Discharge: 2016-07-25 | DRG: 253 | Disposition: A | Discharge status: Home / Self Care | Payer: BLUE CROSS/BLUE SHIELD | Source: Ambulatory Visit | Attending: Vascular Surgery | Admitting: Vascular Surgery

## 2016-07-20 DIAGNOSIS — M797 Fibromyalgia: Secondary | ICD-10-CM | POA: Diagnosis present

## 2016-07-20 DIAGNOSIS — J45909 Unspecified asthma, uncomplicated: Secondary | ICD-10-CM | POA: Diagnosis present

## 2016-07-20 DIAGNOSIS — L97529 Non-pressure chronic ulcer of other part of left foot with unspecified severity: Secondary | ICD-10-CM | POA: Diagnosis present

## 2016-07-20 DIAGNOSIS — I70262 Atherosclerosis of native arteries of extremities with gangrene, left leg: Secondary | ICD-10-CM | POA: Diagnosis present

## 2016-07-20 DIAGNOSIS — E11621 Type 2 diabetes mellitus with foot ulcer: Secondary | ICD-10-CM | POA: Diagnosis present

## 2016-07-20 DIAGNOSIS — I2510 Atherosclerotic heart disease of native coronary artery without angina pectoris: Secondary | ICD-10-CM | POA: Diagnosis present

## 2016-07-20 DIAGNOSIS — D62 Acute posthemorrhagic anemia: Secondary | ICD-10-CM | POA: Diagnosis not present

## 2016-07-20 DIAGNOSIS — I10 Essential (primary) hypertension: Secondary | ICD-10-CM | POA: Diagnosis present

## 2016-07-20 DIAGNOSIS — K219 Gastro-esophageal reflux disease without esophagitis: Secondary | ICD-10-CM | POA: Diagnosis present

## 2016-07-20 DIAGNOSIS — I998 Other disorder of circulatory system: Secondary | ICD-10-CM

## 2016-07-20 DIAGNOSIS — E1140 Type 2 diabetes mellitus with diabetic neuropathy, unspecified: Secondary | ICD-10-CM | POA: Diagnosis present

## 2016-07-20 DIAGNOSIS — E1152 Type 2 diabetes mellitus with diabetic peripheral angiopathy with gangrene: Principal | ICD-10-CM | POA: Diagnosis present

## 2016-07-20 HISTORY — PX: FEMORAL-POPLITEAL BYPASS GRAFT: SHX937

## 2016-07-20 HISTORY — PX: ENDARTERECTOMY FEMORAL: SHX5804

## 2016-07-20 HISTORY — PX: FEMORAL BYPASS: SHX50

## 2016-07-20 LAB — GLUCOSE, CAPILLARY
GLUCOSE-CAPILLARY: 177 mg/dL — AB (ref 65–99)
Glucose-Capillary: 118 mg/dL — ABNORMAL HIGH (ref 65–99)
Glucose-Capillary: 141 mg/dL — ABNORMAL HIGH (ref 65–99)
Glucose-Capillary: 153 mg/dL — ABNORMAL HIGH (ref 65–99)

## 2016-07-20 LAB — CBC
HEMATOCRIT: 18.8 % — AB (ref 36.0–46.0)
HEMATOCRIT: 17.7 % — AB (ref 36.0–46.0)
HEMOGLOBIN: 5.6 g/dL — AB (ref 12.0–15.0)
Hemoglobin: 6.1 g/dL — CL (ref 12.0–15.0)
MCH: 29.8 pg (ref 26.0–34.0)
MCH: 28.9 pg (ref 26.0–34.0)
MCHC: 32.4 g/dL (ref 30.0–36.0)
MCHC: 31.6 g/dL (ref 30.0–36.0)
MCV: 91.7 fL (ref 78.0–100.0)
MCV: 91.2 fL (ref 78.0–100.0)
Platelets: 186 10*3/uL (ref 150–400)
Platelets: 159 10*3/uL (ref 150–400)
RBC: 2.05 MIL/uL — ABNORMAL LOW (ref 3.87–5.11)
RBC: 1.94 MIL/uL — AB (ref 3.87–5.11)
RDW: 15.1 % (ref 11.5–15.5)
RDW: 15 % (ref 11.5–15.5)
WBC: 10.8 10*3/uL — ABNORMAL HIGH (ref 4.0–10.5)
WBC: 8 10*3/uL (ref 4.0–10.5)

## 2016-07-20 LAB — CREATININE, SERUM: Creatinine, Ser: 0.63 mg/dL (ref 0.44–1.00)

## 2016-07-20 LAB — BASIC METABOLIC PANEL
ANION GAP: 7 (ref 5–15)
BUN: 17 mg/dL (ref 6–20)
CALCIUM: 7 mg/dL — AB (ref 8.9–10.3)
CHLORIDE: 109 mmol/L (ref 101–111)
CO2: 24 mmol/L (ref 22–32)
Creatinine, Ser: 0.59 mg/dL (ref 0.44–1.00)
GFR calc non Af Amer: >60 mL/min (ref >60)
Glucose, Bld: 178 mg/dL — ABNORMAL HIGH (ref 65–99)
POTASSIUM: 3.6 mmol/L (ref 3.5–5.1)
Sodium: 140 mmol/L (ref 135–145)

## 2016-07-20 LAB — PREPARE RBC (CROSSMATCH)

## 2016-07-20 SURGERY — BYPASS GRAFT FEMORAL-POPLITEAL ARTERY
Anesthesia: General | Site: Leg Lower | Laterality: Left

## 2016-07-20 MED ORDER — ALBUTEROL SULFATE (2.5 MG/3ML) 0.083% IN NEBU: 3.0000 mL | INHALATION_SOLUTION | Freq: Four times a day (QID) | RESPIRATORY_TRACT | Status: DC | PRN

## 2016-07-20 MED ORDER — SCOPOLAMINE 1 MG/3DAYS TD PT72
MEDICATED_PATCH | TRANSDERMAL | Status: AC
  Filled 2016-07-20: qty 1

## 2016-07-20 MED ORDER — FENTANYL CITRATE (PF) 100 MCG/2ML IJ SOLN
INTRAMUSCULAR | Status: DC | PRN
  Administered 2016-07-20: 50 ug via INTRAVENOUS
  Administered 2016-07-20: 100 ug via INTRAVENOUS
  Administered 2016-07-20 (×2): 50 ug via INTRAVENOUS

## 2016-07-20 MED ORDER — HEPARIN SODIUM (PORCINE) 1000 UNIT/ML IJ SOLN
INTRAMUSCULAR | Status: DC | PRN
  Administered 2016-07-20: 6000 [IU] via INTRAVENOUS

## 2016-07-20 MED ORDER — ALUM & MAG HYDROXIDE-SIMETH 200-200-20 MG/5ML PO SUSP: 15.0000 mL | ORAL | Status: DC | PRN

## 2016-07-20 MED ORDER — DOCUSATE SODIUM 100 MG PO CAPS
100.0000 mg | ORAL_CAPSULE | Freq: Every day | ORAL | Status: DC
  Administered 2016-07-21 – 2016-07-24 (×4): 100 mg via ORAL
  Filled 2016-07-20 (×5): qty 1

## 2016-07-20 MED ORDER — HYDROMORPHONE HCL 1 MG/ML IJ SOLN
0.5000 mg | INTRAMUSCULAR | Status: DC | PRN
  Administered 2016-07-20 (×2): 0.5 mg via INTRAVENOUS
  Administered 2016-07-21: 1 mg via INTRAVENOUS
  Administered 2016-07-22 (×2): 0.5 mg via INTRAVENOUS
  Filled 2016-07-20 (×3): qty 0.5
  Filled 2016-07-20 (×2): qty 1

## 2016-07-20 MED ORDER — PHENOL 1.4 % MT LIQD: 1.0000 | OROMUCOSAL | Status: DC | PRN

## 2016-07-20 MED ORDER — GABAPENTIN 300 MG PO CAPS
600.0000 mg | ORAL_CAPSULE | Freq: Three times a day (TID) | ORAL | Status: DC
  Administered 2016-07-20 – 2016-07-25 (×15): 600 mg via ORAL
  Filled 2016-07-20 (×15): qty 2

## 2016-07-20 MED ORDER — MAGNESIUM SULFATE 2 GM/50ML IV SOLN: 2.0000 g | Freq: Every day | INTRAVENOUS | Status: DC | PRN

## 2016-07-20 MED ORDER — 0.9 % SODIUM CHLORIDE (POUR BTL) OPTIME
TOPICAL | Status: DC | PRN
  Administered 2016-07-20: 2000 mL

## 2016-07-20 MED ORDER — HYDROCODONE-ACETAMINOPHEN 5-325 MG PO TABS
1.0000 | ORAL_TABLET | ORAL | Status: DC | PRN
  Administered 2016-07-20 – 2016-07-21 (×3): 2 via ORAL
  Administered 2016-07-21: 1 via ORAL
  Administered 2016-07-22 – 2016-07-25 (×7): 2 via ORAL
  Filled 2016-07-20 (×8): qty 2
  Filled 2016-07-20: qty 1
  Filled 2016-07-20 (×3): qty 2

## 2016-07-20 MED ORDER — ALBUMIN HUMAN 5 % IV SOLN
12.5000 g | Freq: Once | INTRAVENOUS | Status: AC
  Administered 2016-07-20: 12.5 g via INTRAVENOUS

## 2016-07-20 MED ORDER — FENTANYL CITRATE (PF) 100 MCG/2ML IJ SOLN
25.0000 ug | INTRAMUSCULAR | Status: DC | PRN
  Administered 2016-07-20 (×4): 25 ug via INTRAVENOUS

## 2016-07-20 MED ORDER — HYDROCHLOROTHIAZIDE 25 MG PO TABS
25.0000 mg | ORAL_TABLET | Freq: Every day | ORAL | Status: DC
  Administered 2016-07-20 – 2016-07-25 (×6): 25 mg via ORAL
  Filled 2016-07-20 (×6): qty 1

## 2016-07-20 MED ORDER — ONDANSETRON HCL 4 MG/2ML IJ SOLN
INTRAMUSCULAR | Status: AC
  Filled 2016-07-20: qty 2

## 2016-07-20 MED ORDER — HEPARIN SODIUM (PORCINE) 1000 UNIT/ML IJ SOLN
INTRAMUSCULAR | Status: AC
  Filled 2016-07-20: qty 1

## 2016-07-20 MED ORDER — DEXTROSE 5 % IV SOLN
1.5000 g | Freq: Two times a day (BID) | INTRAVENOUS | Status: AC
  Administered 2016-07-20 – 2016-07-21 (×2): 1.5 g via INTRAVENOUS
  Filled 2016-07-20 (×2): qty 1.5

## 2016-07-20 MED ORDER — ONDANSETRON HCL 4 MG/2ML IJ SOLN: 4.0000 mg | Freq: Four times a day (QID) | INTRAMUSCULAR | Status: DC | PRN

## 2016-07-20 MED ORDER — METFORMIN HCL 500 MG PO TABS
1000.0000 mg | ORAL_TABLET | Freq: Two times a day (BID) | ORAL | Status: DC
  Administered 2016-07-20 – 2016-07-25 (×10): 1000 mg via ORAL
  Filled 2016-07-20 (×10): qty 2

## 2016-07-20 MED ORDER — LIDOCAINE HCL (CARDIAC) 20 MG/ML IV SOLN
INTRAVENOUS | Status: AC
  Filled 2016-07-20: qty 5

## 2016-07-20 MED ORDER — ACETAMINOPHEN 325 MG PO TABS
325.0000 mg | ORAL_TABLET | Freq: Four times a day (QID) | ORAL | Status: DC | PRN
  Administered 2016-07-22: 650 mg via ORAL
  Filled 2016-07-20: qty 2

## 2016-07-20 MED ORDER — DEXTROSE 5 % IV SOLN
1.5000 g | INTRAVENOUS | Status: AC
  Administered 2016-07-20: 1.5 g via INTRAVENOUS

## 2016-07-20 MED ORDER — ROCURONIUM BROMIDE 100 MG/10ML IV SOLN
INTRAVENOUS | Status: DC | PRN
  Administered 2016-07-20: 40 mg via INTRAVENOUS
  Administered 2016-07-20: 10 mg via INTRAVENOUS

## 2016-07-20 MED ORDER — MOMETASONE FURO-FORMOTEROL FUM 100-5 MCG/ACT IN AERO
2.0000 | INHALATION_SPRAY | Freq: Two times a day (BID) | RESPIRATORY_TRACT | Status: DC
  Administered 2016-07-20 – 2016-07-25 (×9): 2 via RESPIRATORY_TRACT
  Filled 2016-07-20: qty 8.8

## 2016-07-20 MED ORDER — PROTAMINE SULFATE 10 MG/ML IV SOLN
INTRAVENOUS | Status: AC
  Filled 2016-07-20: qty 5

## 2016-07-20 MED ORDER — METOPROLOL TARTRATE 12.5 MG HALF TABLET
ORAL_TABLET | ORAL | Status: AC
  Filled 2016-07-20: qty 1

## 2016-07-20 MED ORDER — PROMETHAZINE HCL 25 MG/ML IJ SOLN: 6.2500 mg | INTRAMUSCULAR | Status: DC | PRN

## 2016-07-20 MED ORDER — SODIUM CHLORIDE 0.9 % IV SOLN: Freq: Once | INTRAVENOUS | Status: DC

## 2016-07-20 MED ORDER — FENTANYL CITRATE (PF) 250 MCG/5ML IJ SOLN
INTRAMUSCULAR | Status: AC
  Filled 2016-07-20: qty 5

## 2016-07-20 MED ORDER — MIDAZOLAM HCL 2 MG/2ML IJ SOLN
INTRAMUSCULAR | Status: AC
  Filled 2016-07-20: qty 2

## 2016-07-20 MED ORDER — PANTOPRAZOLE SODIUM 20 MG PO TBEC
20.0000 mg | DELAYED_RELEASE_TABLET | Freq: Every day | ORAL | Status: DC
  Administered 2016-07-20 – 2016-07-24 (×5): 20 mg via ORAL
  Filled 2016-07-20 (×6): qty 1

## 2016-07-20 MED ORDER — ALBUMIN HUMAN 5 % IV SOLN
INTRAVENOUS | Status: AC
  Filled 2016-07-20: qty 250

## 2016-07-20 MED ORDER — ATORVASTATIN CALCIUM 80 MG PO TABS
80.0000 mg | ORAL_TABLET | Freq: Every day | ORAL | Status: DC
  Administered 2016-07-20 – 2016-07-24 (×5): 80 mg via ORAL
  Filled 2016-07-20 (×5): qty 1

## 2016-07-20 MED ORDER — CITALOPRAM HYDROBROMIDE 20 MG PO TABS
20.0000 mg | ORAL_TABLET | Freq: Every day | ORAL | Status: DC
  Administered 2016-07-20 – 2016-07-25 (×6): 20 mg via ORAL
  Filled 2016-07-20 (×6): qty 1

## 2016-07-20 MED ORDER — SODIUM CHLORIDE 0.9 % IV SOLN: INTRAVENOUS | Status: DC

## 2016-07-20 MED ORDER — DEXMEDETOMIDINE HCL IN NACL 80 MCG/20ML IV SOLN
INTRAVENOUS | Status: DC
  Filled 2016-07-20: qty 20

## 2016-07-20 MED ORDER — EPHEDRINE 5 MG/ML INJ
INTRAVENOUS | Status: AC
  Filled 2016-07-20: qty 10

## 2016-07-20 MED ORDER — PROMETHAZINE HCL 25 MG/ML IJ SOLN: 12.5000 mg | Freq: Once | INTRAMUSCULAR | Status: DC | PRN

## 2016-07-20 MED ORDER — CHLORHEXIDINE GLUCONATE 4 % EX LIQD: 60.0000 mL | Freq: Once | CUTANEOUS | Status: DC

## 2016-07-20 MED ORDER — HYDRALAZINE HCL 20 MG/ML IJ SOLN: 5.0000 mg | INTRAMUSCULAR | Status: DC | PRN

## 2016-07-20 MED ORDER — OXYBUTYNIN CHLORIDE 5 MG PO TABS
5.0000 mg | ORAL_TABLET | Freq: Two times a day (BID) | ORAL | Status: DC
  Administered 2016-07-20 – 2016-07-25 (×10): 5 mg via ORAL
  Filled 2016-07-20 (×10): qty 1

## 2016-07-20 MED ORDER — CLOPIDOGREL BISULFATE 75 MG PO TABS
75.0000 mg | ORAL_TABLET | Freq: Every day | ORAL | Status: DC
  Administered 2016-07-21 – 2016-07-25 (×5): 75 mg via ORAL
  Filled 2016-07-20 (×5): qty 1

## 2016-07-20 MED ORDER — PROMETHAZINE HCL 25 MG PO TABS: 12.5000 mg | ORAL_TABLET | Freq: Four times a day (QID) | ORAL | Status: DC | PRN

## 2016-07-20 MED ORDER — PHENYLEPHRINE HCL 10 MG/ML IJ SOLN
INTRAMUSCULAR | Status: DC | PRN
  Administered 2016-07-20: 120 ug via INTRAVENOUS
  Administered 2016-07-20 (×2): 80 ug via INTRAVENOUS

## 2016-07-20 MED ORDER — MIDAZOLAM HCL 5 MG/5ML IJ SOLN
INTRAMUSCULAR | Status: DC | PRN
  Administered 2016-07-20: 2 mg via INTRAVENOUS

## 2016-07-20 MED ORDER — ENOXAPARIN SODIUM 40 MG/0.4ML ~~LOC~~ SOLN: 40.0000 mg | SUBCUTANEOUS | Status: DC

## 2016-07-20 MED ORDER — INSULIN GLARGINE 100 UNIT/ML SOLOSTAR PEN: 30.0000 [IU] | PEN_INJECTOR | Freq: Every day | SUBCUTANEOUS | Status: DC

## 2016-07-20 MED ORDER — SUGAMMADEX SODIUM 200 MG/2ML IV SOLN
INTRAVENOUS | Status: AC
  Filled 2016-07-20: qty 2

## 2016-07-20 MED ORDER — INSULIN ASPART 100 UNIT/ML ~~LOC~~ SOLN
0.0000 [IU] | Freq: Three times a day (TID) | SUBCUTANEOUS | Status: DC
  Administered 2016-07-20: 2 [IU] via SUBCUTANEOUS
  Administered 2016-07-21: 1 [IU] via SUBCUTANEOUS
  Administered 2016-07-24: 2 [IU] via SUBCUTANEOUS

## 2016-07-20 MED ORDER — LIDOCAINE HCL (CARDIAC) 20 MG/ML IV SOLN
INTRAVENOUS | Status: DC | PRN
  Administered 2016-07-20: 20 mg via INTRAVENOUS

## 2016-07-20 MED ORDER — ALBUMIN HUMAN 5 % IV SOLN
INTRAVENOUS | Status: DC | PRN
  Administered 2016-07-20: 10:00:00 via INTRAVENOUS

## 2016-07-20 MED ORDER — GUAIFENESIN-DM 100-10 MG/5ML PO SYRP: 15.0000 mL | ORAL_SOLUTION | ORAL | Status: DC | PRN

## 2016-07-20 MED ORDER — CYCLOBENZAPRINE HCL 10 MG PO TABS: 5.0000 mg | ORAL_TABLET | Freq: Four times a day (QID) | ORAL | Status: DC | PRN

## 2016-07-20 MED ORDER — CEFUROXIME SODIUM 1.5 G IV SOLR
INTRAVENOUS | Status: AC
  Filled 2016-07-20: qty 1.5

## 2016-07-20 MED ORDER — HEMOSTATIC AGENTS (NO CHARGE) OPTIME
TOPICAL | Status: DC | PRN
  Administered 2016-07-20 (×3): 1 via TOPICAL

## 2016-07-20 MED ORDER — METOCLOPRAMIDE HCL 5 MG/ML IJ SOLN
INTRAMUSCULAR | Status: AC
  Filled 2016-07-20: qty 2

## 2016-07-20 MED ORDER — METOPROLOL TARTRATE 12.5 MG HALF TABLET
12.5000 mg | ORAL_TABLET | Freq: Once | ORAL | Status: AC
  Administered 2016-07-20: 12.5 mg via ORAL

## 2016-07-20 MED ORDER — POTASSIUM CHLORIDE CRYS ER 20 MEQ PO TBCR: 20.0000 meq | EXTENDED_RELEASE_TABLET | Freq: Every day | ORAL | Status: DC | PRN

## 2016-07-20 MED ORDER — LACTATED RINGERS IV SOLN
INTRAVENOUS | Status: DC | PRN
  Administered 2016-07-20 (×2): via INTRAVENOUS

## 2016-07-20 MED ORDER — ASPIRIN EC 81 MG PO TBEC
81.0000 mg | DELAYED_RELEASE_TABLET | Freq: Every day | ORAL | Status: DC
  Administered 2016-07-21 – 2016-07-25 (×5): 81 mg via ORAL
  Filled 2016-07-20 (×5): qty 1

## 2016-07-20 MED ORDER — INSULIN GLARGINE 100 UNIT/ML ~~LOC~~ SOLN
30.0000 [IU] | Freq: Every day | SUBCUTANEOUS | Status: DC
  Administered 2016-07-20 – 2016-07-24 (×4): 30 [IU] via SUBCUTANEOUS
  Filled 2016-07-20 (×6): qty 0.3

## 2016-07-20 MED ORDER — SODIUM CHLORIDE 0.9 % IV SOLN
500.0000 mL | Freq: Once | INTRAVENOUS | Status: AC | PRN
  Administered 2016-07-20: 500 mL via INTRAVENOUS

## 2016-07-20 MED ORDER — METOCLOPRAMIDE HCL 5 MG/ML IJ SOLN
10.0000 mg | Freq: Once | INTRAMUSCULAR | Status: AC | PRN
  Administered 2016-07-20: 10 mg via INTRAVENOUS

## 2016-07-20 MED ORDER — SUGAMMADEX SODIUM 200 MG/2ML IV SOLN
INTRAVENOUS | Status: DC | PRN
  Administered 2016-07-20: 130 mg via INTRAVENOUS

## 2016-07-20 MED ORDER — PROTAMINE SULFATE 10 MG/ML IV SOLN
INTRAVENOUS | Status: DC | PRN
  Administered 2016-07-20: 50 mg via INTRAVENOUS

## 2016-07-20 MED ORDER — ONDANSETRON HCL 4 MG/2ML IJ SOLN
4.0000 mg | Freq: Once | INTRAMUSCULAR | Status: AC | PRN
  Administered 2016-07-20: 4 mg via INTRAVENOUS

## 2016-07-20 MED ORDER — METOPROLOL TARTRATE 12.5 MG HALF TABLET
12.5000 mg | ORAL_TABLET | Freq: Two times a day (BID) | ORAL | Status: DC
  Administered 2016-07-20 – 2016-07-25 (×10): 12.5 mg via ORAL
  Filled 2016-07-20 (×10): qty 1

## 2016-07-20 MED ORDER — PROPOFOL 10 MG/ML IV BOLUS
INTRAVENOUS | Status: DC | PRN
  Administered 2016-07-20: 150 mg via INTRAVENOUS

## 2016-07-20 MED ORDER — EPHEDRINE SULFATE 50 MG/ML IJ SOLN
INTRAMUSCULAR | Status: DC | PRN
  Administered 2016-07-20 (×3): 5 mg via INTRAVENOUS
  Administered 2016-07-20: 15 mg via INTRAVENOUS
  Administered 2016-07-20 (×2): 10 mg via INTRAVENOUS

## 2016-07-20 MED ORDER — SODIUM CHLORIDE 0.9 % IV SOLN
INTRAVENOUS | Status: DC
  Administered 2016-07-20: 1000 mL via INTRAVENOUS
  Administered 2016-07-21 – 2016-07-22 (×3): via INTRAVENOUS

## 2016-07-20 MED ORDER — LABETALOL HCL 5 MG/ML IV SOLN: 10.0000 mg | INTRAVENOUS | Status: DC | PRN

## 2016-07-20 MED ORDER — PAPAVERINE HCL 30 MG/ML IJ SOLN
INTRAMUSCULAR | Status: AC
  Filled 2016-07-20: qty 2

## 2016-07-20 MED ORDER — FENTANYL CITRATE (PF) 100 MCG/2ML IJ SOLN
INTRAMUSCULAR | Status: AC
  Administered 2016-07-20: 25 ug via INTRAVENOUS
  Filled 2016-07-20: qty 2

## 2016-07-20 MED ORDER — SODIUM CHLORIDE 0.9 % IV SOLN
Freq: Once | INTRAVENOUS | Status: AC
  Administered 2016-07-20: 22:00:00 via INTRAVENOUS

## 2016-07-20 MED ORDER — HEPARIN SODIUM (PORCINE) 5000 UNIT/ML IJ SOLN
INTRAMUSCULAR | Status: DC | PRN
  Administered 2016-07-20: 500 mL

## 2016-07-20 MED ORDER — SCOPOLAMINE 1 MG/3DAYS TD PT72
MEDICATED_PATCH | TRANSDERMAL | Status: DC | PRN
  Administered 2016-07-20: 1 via TRANSDERMAL

## 2016-07-20 MED ORDER — PROPOFOL 10 MG/ML IV BOLUS
INTRAVENOUS | Status: AC
  Filled 2016-07-20: qty 20

## 2016-07-20 MED ORDER — ROCURONIUM BROMIDE 50 MG/5ML IV SOLN
INTRAVENOUS | Status: AC
  Filled 2016-07-20: qty 1

## 2016-07-20 MED ORDER — METOPROLOL TARTRATE 5 MG/5ML IV SOLN: 2.0000 mg | INTRAVENOUS | Status: DC | PRN

## 2016-07-20 MED ORDER — PHENYLEPHRINE HCL 10 MG/ML IJ SOLN
INTRAVENOUS | Status: DC | PRN
  Administered 2016-07-20: 30 ug/min via INTRAVENOUS

## 2016-07-20 MED ORDER — PROMETHAZINE HCL 25 MG/ML IJ SOLN
INTRAMUSCULAR | Status: AC
Start: 2016-07-20 — End: 2016-07-20
  Administered 2016-07-20: 6.25 mg
  Filled 2016-07-20: qty 1

## 2016-07-20 SURGICAL SUPPLY — 61 items
ADH SKN CLS APL DERMABOND .7 (GAUZE/BANDAGES/DRESSINGS) ×2
ADH SKN CLS LQ APL DERMABOND (GAUZE/BANDAGES/DRESSINGS) ×2
AGENT HMST SPONGE THK3/8 (HEMOSTASIS) ×6
BANDAGE ACE 4X5 VEL STRL LF (GAUZE/BANDAGES/DRESSINGS) IMPLANT
BANDAGE ESMARK 6X9 LF (GAUZE/BANDAGES/DRESSINGS) IMPLANT
BNDG CMPR 9X6 STRL LF SNTH (GAUZE/BANDAGES/DRESSINGS)
BNDG ESMARK 6X9 LF (GAUZE/BANDAGES/DRESSINGS)
CANISTER SUCT 3000ML PPV (MISCELLANEOUS) ×3 IMPLANT
CANNULA VESSEL 3MM 2 BLNT TIP (CANNULA) IMPLANT
CUFF TOURNIQUET SINGLE 24IN (TOURNIQUET CUFF) IMPLANT
CUFF TOURNIQUET SINGLE 34IN LL (TOURNIQUET CUFF) IMPLANT
CUFF TOURNIQUET SINGLE 44IN (TOURNIQUET CUFF) IMPLANT
DERMABOND ADHESIVE PROPEN (GAUZE/BANDAGES/DRESSINGS) ×1
DERMABOND ADVANCED (GAUZE/BANDAGES/DRESSINGS) ×1
DERMABOND ADVANCED .7 DNX12 (GAUZE/BANDAGES/DRESSINGS) ×2 IMPLANT
DERMABOND ADVANCED .7 DNX6 (GAUZE/BANDAGES/DRESSINGS) IMPLANT
DRAIN CHANNEL 15F RND FF W/TCR (WOUND CARE) IMPLANT
DRAPE C-ARM 42X72 X-RAY (DRAPES) ×2 IMPLANT
DRAPE HALF SHEET 40X57 (DRAPES) ×1 IMPLANT
DRESSING PREVENA PLUS CUSTOM (GAUZE/BANDAGES/DRESSINGS) IMPLANT
DRSG PREVENA PLUS CUSTOM (GAUZE/BANDAGES/DRESSINGS)
ELECT CAUTERY BLADE 6.4 (BLADE) ×1 IMPLANT
ELECT REM PT RETURN 9FT ADLT (ELECTROSURGICAL) ×3
ELECTRODE REM PT RTRN 9FT ADLT (ELECTROSURGICAL) ×2 IMPLANT
EVACUATOR SILICONE 100CC (DRAIN) IMPLANT
GLOVE BIO SURGEON STRL SZ7.5 (GLOVE) ×3 IMPLANT
GOWN STRL REUS W/ TWL LRG LVL3 (GOWN DISPOSABLE) ×4 IMPLANT
GOWN STRL REUS W/ TWL XL LVL3 (GOWN DISPOSABLE) ×2 IMPLANT
GOWN STRL REUS W/TWL LRG LVL3 (GOWN DISPOSABLE) ×6
GOWN STRL REUS W/TWL XL LVL3 (GOWN DISPOSABLE) ×3
GRAFT PROPATEN W/RING 6X80X60 (Vascular Products) ×1 IMPLANT
HEMOSTAT SPONGE AVITENE ULTRA (HEMOSTASIS) ×3 IMPLANT
INSERT FOGARTY SM (MISCELLANEOUS) ×2 IMPLANT
KIT BASIN OR (CUSTOM PROCEDURE TRAY) ×3 IMPLANT
KIT PREVENA INCISION MGT20CM45 (CANNISTER) ×1 IMPLANT
KIT ROOM TURNOVER OR (KITS) ×3 IMPLANT
MARKER GRAFT CORONARY BYPASS (MISCELLANEOUS) IMPLANT
NS IRRIG 1000ML POUR BTL (IV SOLUTION) ×6 IMPLANT
PACK PERIPHERAL VASCULAR (CUSTOM PROCEDURE TRAY) ×3 IMPLANT
PAD ARMBOARD 7.5X6 YLW CONV (MISCELLANEOUS) ×6 IMPLANT
PENCIL BUTTON HOLSTER BLD 10FT (ELECTRODE) ×1 IMPLANT
SET COLLECT BLD 21X3/4 12 (NEEDLE) IMPLANT
SPONGE LAP 18X18 X RAY DECT (DISPOSABLE) ×3 IMPLANT
STOPCOCK 4 WAY LG BORE MALE ST (IV SETS) IMPLANT
SUT ETHILON 3 0 PS 1 (SUTURE) IMPLANT
SUT GORETEX 6.0 TT13 (SUTURE) IMPLANT
SUT GORETEX 6.0 TT9 (SUTURE) ×3 IMPLANT
SUT MNCRL AB 4-0 PS2 18 (SUTURE) ×6 IMPLANT
SUT PROLENE 5 0 C 1 24 (SUTURE) ×6 IMPLANT
SUT PROLENE 6 0 BV (SUTURE) ×5 IMPLANT
SUT PROLENE 7 0 BV 1 (SUTURE) ×1 IMPLANT
SUT SILK 2 0 SH (SUTURE) ×3 IMPLANT
SUT SILK 3 0 (SUTURE)
SUT SILK 3-0 18XBRD TIE 12 (SUTURE) IMPLANT
SUT VIC AB 2-0 CT1 27 (SUTURE) ×6
SUT VIC AB 2-0 CT1 TAPERPNT 27 (SUTURE) ×4 IMPLANT
SUT VIC AB 3-0 SH 27 (SUTURE) ×6
SUT VIC AB 3-0 SH 27X BRD (SUTURE) ×4 IMPLANT
TRAY FOLEY W/METER SILVER 16FR (SET/KITS/TRAYS/PACK) ×3 IMPLANT
UNDERPAD 30X30 (UNDERPADS AND DIAPERS) ×3 IMPLANT
WATER STERILE IRR 1000ML POUR (IV SOLUTION) ×3 IMPLANT

## 2016-07-20 NOTE — Progress Notes (Signed)
  [facility] Day of Surgery Note  Subjective:  Seen in PACU. Left groin hurts. Feeling nauseous.   Vitals:   07/20/16 1130 07/20/16 1145  BP: (!) 102/48 (!) 138/45  Pulse: 89 85  Resp: 13 14  Temp:      Left groin prevena VAC in place. Left below knee incision intact. Brisk left DP, monophasic left PT.  Dry gangrene left great toe.  Right groin clean with red granulation tissue.   Assessment/Plan:  This is a 66 y.o. female who is s/p left femoral to below knee artery bypass with PTFE  Stable post-op Good doppler flow leftt foot.  Continue wet to dry dressings right groin.  To 4E when bed available.   Maris BergerKimberly Shalana Jardin, [place] JerseyPA-C Pager: [phone] 07/20/2016 11:54 AM

## 2016-07-20 NOTE — Progress Notes (Signed)
CRITICAL VALUE ALERT  Critical Value:  Hgb 5.6  Date & Time Notied:  07/20/16 2149  Provider Notified: Dr. Arbie CookeyEarly  Orders Received/Actions taken: Orders to transfuse already prepared 2 units of PRBC. Repeat labs in morning as already ordered.

## 2016-07-20 NOTE — Anesthesia Preprocedure Evaluation (Addendum)
Anesthesia Evaluation  Patient identified by MRN, date of birth, ID band Patient awake    Reviewed: Allergy & Precautions, NPO status , Patient's Chart, lab work & pertinent test results  Airway Mallampati: II  TM Distance: >3 FB Neck ROM: Full    Dental  (+) Edentulous Upper, Dental Advisory Given, Partial Lower   Pulmonary former smoker,    breath sounds clear to auscultation       Cardiovascular hypertension,  Rhythm:Regular Rate:Normal     Neuro/Psych    GI/Hepatic   Endo/Other  diabetes  Renal/GU      Musculoskeletal   Abdominal   Peds  Hematology   Anesthesia Other Findings   Reproductive/Obstetrics                             Anesthesia Physical Anesthesia Plan  ASA: III  Anesthesia Plan: General   Post-op Pain Management:    Induction: Intravenous  PONV Risk Score and Plan: Ondansetron, Propofol and Scopolamine patch - Pre-op  Airway Management Planned: Oral ETT  Additional Equipment:   Intra-op Plan:   Post-operative Plan: Extubation in OR  Informed Consent: I have reviewed the patients History and Physical, chart, labs and discussed the procedure including the risks, benefits and alternatives for the proposed anesthesia with the patient or authorized representative who has indicated his/her understanding and acceptance.   Dental advisory given  Plan Discussed with: CRNA and Anesthesiologist  Anesthesia Plan Comments:         Anesthesia Quick Evaluation

## 2016-07-20 NOTE — Transfer of Care (Signed)
Immediate Anesthesia Transfer of Care Note  Patient: Andrea [name]  Procedure(s) Performed: Procedure(s): LEFT FEMORAL TO BELOW KNEE POPLITEAL ARTERY  BYPASS GRAFT (Left) LEFT FEMORAL ENDARTERECTOMY (Left)  Patient Location: PACU  Anesthesia Type:General  Level of Consciousness: awake, alert  and patient cooperative  Airway & Oxygen Therapy: Patient Spontanous Breathing  Post-op Assessment: Report given to RN and Post -op Vital signs reviewed and stable  Post vital signs: Reviewed and stable  Last Vitals:  Vitals:   07/20/16 0559  BP: (!) 146/78  Pulse: 62  Resp: 18  Temp: 37.2 C    Last Pain:  Vitals:   07/20/16 0559  TempSrc: Oral      Patients Stated Pain Goal: 7 (07/20/16 [id number])  Complications: No apparent anesthesia complications

## 2016-07-20 NOTE — Progress Notes (Signed)
Dr Noreene Larssonjoslin at bedside pt slighty agitated restless medicated by dr Noreene Larssonjoslin

## 2016-07-20 NOTE — H&P (Signed)
   History and Physical Update  The patient was interviewed and re-examined.  The patient's previous History and Physical has been reviewed and is unchanged from office visit. Plan for left femoral to below knee popliteal artery bypass likely with graft.   Tilla Wilborn C. Randie Heinzain, MD [facility] [place] Office: [phone] Pager: [phone]   07/20/2016, 7:07 AM

## 2016-07-20 NOTE — Op Note (Signed)
    Patient name: Andrea [name] B Yera MRN: [id number] DOB: 1950-06-18 Sex: female  07/20/2016 Pre-operative Diagnosis: critical left lower extremity ischemia Post-operative diagnosis:  Same Surgeon:  Apolinar JunesBrandon C. Randie Heinzain, MD Assistants: Leonides SakeBrian Chen, MD  Karsten RoKim Trinh, PA Procedure Performed: 1.  Left common femoral endarterectomy 2.  Left femoral to below knee popliteal artery bypass with 6mm ringed ptfe  Indications:  66 year old female history of bilateral critical limb ischemia with wounds she has previously undergone right femoral to popliteal artery bypass grafting and had delayed healing of a right groin wound. She is now healed most of that wound and indicated for similar operation on the left.  Findings: The left common femoral artery was severely diseased with soft plaque extending from the external iliac artery into the proximal SFA and profunda femoris arteries. Below-knee popliteal artery was soft and amenable for bypassing. Endarterectomy was performed the left common femoral and profunda femoris arteries and inflow was adequate. At completion bypass grafting there was a strong signal to anterior tibial artery that augmented with compression of the graft.   Procedure:  The patient was identified in the holding area and taken to the operating room where she is placed supine on the operating table and general anesthesia was induced. She was given antibiotics and sterilely prepped and draped in the left leg in the usual fashion. Timeout was called. Began with concomitant operations in the left groin and left below-knee where incisions were made and carried down to the level of the artery. In the left groin we encircled the external iliac profunda femoris and superficial femoral arteries with vessel loops. Below the knee we encircled the arterial artery which was noted to be soft with vessel loops. We then tunneled a 6 mm ringed PTFE graft between the 2 incisions and she was heparinized. We then clamped  distally and proximally the artery in the groin opened longitudinally where we encountered significant soft plaque. Endarterectomy was performed into the profunda femoris artery. There was adequate antegrade bleeding. We then trimmed our graft to size. Below the knee we clamped proximally and distally and opened the artery which was noted to be soft amenable for bypass and the graft was trimmed to size there. Below the knee the bypass was sewn end to side with CV 6 suture. The common femoral artery we sewed end to side with 5-0 Prolene. Prior to completing our anastomosis we allowed flushing antegrade and retrograde flushed our graft. Incompletely the anastomosis had significant suture hole bleeding. She did have a strong anterior tibial artery signal at the ankle which augmented with compression of the graft. She was then given 50 mg of protamine which she tolerated well. Meticulous hemostasis was obtained we then closed both wounds and layers with 2-0 Vicryl followed by 3-0 Vicryl and 4-0 Monocryl suture. Patient tolerated procedure well without immediate complication. All counts were correct at completion.  Blood loss 600 mL.   Brandon C. Randie Heinzain, MD [facility] [place] Office: [phone] Pager: [phone]

## 2016-07-20 NOTE — Progress Notes (Signed)
[facility]  Patient Status: Active (receiving services up to time of hospitalization)  [facility] is providing the following services: RN and MSW  If patient discharges after hours, please call [phone].   Kizzie FurnishDonna Fellmy 07/20/2016, 4:43 PM

## 2016-07-20 NOTE — Anesthesia Postprocedure Evaluation (Signed)
Anesthesia Post Note  Patient: Andrea [name]  Procedure(s) Performed: Procedure(s) (LRB): LEFT FEMORAL TO BELOW KNEE POPLITEAL ARTERY  BYPASS GRAFT (Left) LEFT FEMORAL ENDARTERECTOMY (Left)     Patient location during evaluation: PACU Anesthesia Type: General Level of consciousness: awake, awake and alert and oriented Vital Signs Assessment: post-procedure vital signs reviewed and stable Respiratory status: spontaneous breathing, nonlabored ventilation and respiratory function stable Cardiovascular status: blood pressure returned to baseline Anesthetic complications: no    Last Vitals:  Vitals:   07/20/16 1550 07/20/16 1615  BP: (!) 120/48 (!) 112/94  Pulse: 95 90  Resp: 13 18  Temp: 37.1 C 36.9 C    Last Pain:  Vitals:   07/20/16 1925  TempSrc:   PainSc: 8                  [name]

## 2016-07-20 NOTE — Anesthesia Procedure Notes (Signed)
Procedure Name: Intubation Date/Time: 07/20/2016 7:38 AM Performed by: Rosiland OzMEYERS, Quay Simkin Pre-anesthesia Checklist: Patient identified, Emergency Drugs available, Suction available, Patient being monitored and Timeout performed Patient Re-evaluated:Patient Re-evaluated prior to inductionOxygen Delivery Method: Circle system utilized Preoxygenation: Pre-oxygenation with 100% oxygen Intubation Type: IV induction Ventilation: Mask ventilation without difficulty Laryngoscope Size: Miller and 3 Tube type: Oral Tube size: 7.0 mm Number of attempts: 1 Airway Equipment and Method: Stylet Placement Confirmation: ETT inserted through vocal cords under direct vision and breath sounds checked- equal and bilateral Secured at: 20 cm Tube secured with: Tape Dental Injury: Teeth and Oropharynx as per pre-operative assessment

## 2016-07-20 NOTE — Progress Notes (Signed)
CRITICAL VALUE ALERT  Critical Value:  Hgb 6.1  Date & Time Notied:  07/20/16 2026  Provider Notified: Dr. Arbie CookeyEarly   Orders Received/Actions taken: Verbal orders received to prepare 2 units of PRBCs and to recheck CBC to confirm low HGB.  Orders placed in Epic.  Patient assessed, no evidence of bleeding, no drainage from surgical site.  VSS.

## 2016-07-21 ENCOUNTER — Encounter (HOSPITAL_COMMUNITY): Payer: BLUE CROSS/BLUE SHIELD

## 2016-07-21 ENCOUNTER — Encounter (HOSPITAL_COMMUNITY): Payer: Self-pay | Admitting: Vascular Surgery

## 2016-07-21 LAB — CBC
HEMATOCRIT: 26.9 % — AB (ref 36.0–46.0)
HEMOGLOBIN: 8.7 g/dL — AB (ref 12.0–15.0)
MCH: 28.8 pg (ref 26.0–34.0)
MCHC: 32.3 g/dL (ref 30.0–36.0)
MCV: 89.1 fL (ref 78.0–100.0)
Platelets: 161 10*3/uL (ref 150–400)
RBC: 3.02 MIL/uL — AB (ref 3.87–5.11)
RDW: 15.5 % (ref 11.5–15.5)
WBC: 10.1 10*3/uL (ref 4.0–10.5)

## 2016-07-21 LAB — GLUCOSE, CAPILLARY
GLUCOSE-CAPILLARY: 105 mg/dL — AB (ref 65–99)
GLUCOSE-CAPILLARY: 152 mg/dL — AB (ref 65–99)
Glucose-Capillary: 154 mg/dL — ABNORMAL HIGH (ref 65–99)
Glucose-Capillary: 154 mg/dL — ABNORMAL HIGH (ref 65–99)

## 2016-07-21 LAB — BASIC METABOLIC PANEL
ANION GAP: 9 (ref 5–15)
BUN: 13 mg/dL (ref 6–20)
CHLORIDE: 106 mmol/L (ref 101–111)
CO2: 24 mmol/L (ref 22–32)
CREATININE: 0.57 mg/dL (ref 0.44–1.00)
Calcium: 7.7 mg/dL — ABNORMAL LOW (ref 8.9–10.3)
GFR calc non Af Amer: >60 mL/min (ref >60)
Glucose, Bld: 107 mg/dL — ABNORMAL HIGH (ref 65–99)
POTASSIUM: 3.5 mmol/L (ref 3.5–5.1)
SODIUM: 139 mmol/L (ref 135–145)

## 2016-07-21 MED ORDER — PROMETHAZINE HCL 25 MG PO TABS: 12.5000 mg | ORAL_TABLET | Freq: Four times a day (QID) | ORAL | Status: DC | PRN

## 2016-07-21 MED ORDER — ENOXAPARIN SODIUM 40 MG/0.4ML ~~LOC~~ SOLN
40.0000 mg | SUBCUTANEOUS | Status: DC
  Administered 2016-07-21 – 2016-07-24 (×4): 40 mg via SUBCUTANEOUS
  Filled 2016-07-21 (×4): qty 0.4

## 2016-07-21 MED FILL — MUPIROCIN 2% OINTMENT: 2 | 5 days supply | Qty: 22 | Fill #0

## 2016-07-21 NOTE — Evaluation (Signed)
Physical Therapy Evaluation Patient Details Name: Andrea [name] MRN: [id number] DOB: 1950/06/08 Today's Date: 07/21/2016   History of Present Illness  66 year old female history admitted with gangenous L great toe, s/p L fem-below knee popliteal artery BPG and L fem endarterectomy  07/20/16. PMH of CABG, right fem-below knee popliteal artery BPG and right fem endarterectomy on 05/21/16. who D/Cd to SNF and returned 6/1 with Right groin wound infection s/p debridement 6/3 and VAC placement.   Clinical Impression  Patient is s/p above surgery resulting in functional limitations due to the deficits listed below (see PT Problem List). Pt is minA for bed mobility, transfers and ambulation of 2 feet to recliner. Patient will benefit from skilled PT to increase their independence and safety with mobility to allow discharge to the venue listed below.       Follow Up Recommendations Home health PT;Supervision/Assistance - 24 hour    Equipment Recommendations  None recommended by PT    Recommendations for Other Services OT consult     Precautions / Restrictions Precautions Precautions: Fall      Mobility  Bed Mobility Overal bed mobility: Needs Assistance Bed Mobility: Supine to Sit     Supine to sit: Min assist     General bed mobility comments: minA for LE mangement to floor and pad scoot of hips to EoB  Transfers Overall transfer level: Needs assistance Equipment used: Rolling walker (2 wheeled) Transfers: Sit to/from Stand Sit to Stand: Min assist         General transfer comment: minA for power up, vc for hand placement and push off, good hand placement and lowering down to recliner  Ambulation/Gait Ambulation/Gait assistance: Min assist Ambulation Distance (Feet): 2 Feet Assistive device: Rolling walker (2 wheeled) Gait Pattern/deviations: Step-to pattern;Shuffle;Decreased stride length;Trunk flexed Gait velocity: slowed Gait velocity interpretation: Below normal  speed for age/gender General Gait Details: minA for steadying, vc for upright posture and picking up L foot      Balance Overall balance assessment: Needs assistance Sitting-balance support: No upper extremity supported;Feet supported Sitting balance-Leahy Scale: Fair     Standing balance support: Bilateral upper extremity supported Standing balance-Leahy Scale: Poor Standing balance comment: requires RW assit                             Pertinent Vitals/Pain Pain Assessment: 0-10 Pain Score: 0-No pain Pain Location: L leg pain  Pain Intervention(s): Monitored during session  VSS    Home Living Family/patient expects to be discharged to:: Private residence Living Arrangements: Alone Available Help at Discharge: Family;Available 24 hours/day Type of Home: Apartment Home Access: Stairs to enter   Entrance Stairs-Number of Steps: 1 Home Layout: Two level;Bed/bath upstairs;1/2 bath on main level Home Equipment: Walker - 4 wheels;Other (comment);Bedside commode;Tub bench (4 wheeled walker on each floor)      Prior Function Level of Independence: Independent with assistive device(s)         Comments: limited community ambulator and driver     Hand Dominance        Extremity/Trunk Assessment   Upper Extremity Assessment Upper Extremity Assessment: Overall WFL for tasks assessed    Lower Extremity Assessment Lower Extremity Assessment: LLE deficits/detail;RLE deficits/detail RLE Deficits / Details: prior R Lfem-pop limited hip ROM strength grossly 3+/5 LLE Deficits / Details: L fem-pop, L ROM and strength limited by surgical intervention  LLE: Unable to fully assess due to pain    Cervical / Trunk  Assessment Cervical / Trunk Assessment: Kyphotic  Communication   Communication: HOH  Cognition Arousal/Alertness: Awake/alert Behavior During Therapy: WFL for tasks assessed/performed Overall Cognitive Status: Within Functional Limits for tasks  assessed                                        General Comments General comments (skin integrity, edema, etc.): SaO2 on RA 93% with activity        Assessment/Plan    PT Assessment Patient needs continued PT services  PT Problem List Decreased strength;Decreased range of motion;Decreased activity tolerance;Decreased balance;Decreased mobility;Decreased safety awareness;Decreased knowledge of precautions;Pain       PT Treatment Interventions DME instruction;Gait training;Stair training;Functional mobility training;Therapeutic activities;Therapeutic exercise;Balance training;Patient/family education    PT Goals (Current goals can be found in the Care Plan section)  Acute Rehab PT Goals Patient Stated Goal: go home PT Goal Formulation: With patient    Frequency Min 3X/week    AM-PAC PT "6 Clicks" Daily Activity  Outcome Measure Difficulty turning over in bed (including adjusting bedclothes, sheets and blankets)?: A Lot Difficulty moving from lying on back to sitting on the side of the bed? : Total Difficulty sitting down on and standing up from a chair with arms (e.g., wheelchair, bedside commode, etc,.)?: Total Help needed moving to and from a bed to chair (including a wheelchair)?: A Little Help needed walking in hospital room?: A Little Help needed climbing 3-5 steps with a railing? : A Lot 6 Click Score: 12    End of Session Equipment Utilized During Treatment: Gait belt Activity Tolerance: Patient tolerated treatment well Patient left: in chair;with call bell/phone within reach;with chair alarm set Nurse Communication: Mobility status      Time: 1610-9604 PT Time Calculation (min) (ACUTE ONLY): 24 min   Charges:   PT Evaluation $PT Eval Moderate Complexity: 1 Procedure PT Treatments $Therapeutic Activity: 8-22 mins   PT G Codes:        Andrea Noteboom B. Beverely Risen PT, DPT Acute Rehabilitation  [phone] Pager [phone]    Elon Alas Fleet 07/21/2016, 12:09 PM

## 2016-07-21 NOTE — Evaluation (Signed)
Occupational Therapy Evaluation Patient Details Name: Andrea [name] MRN: [id number] DOB: 24-Nov-1950 Today's Date: 07/21/2016    History of Present Illness 66 year old female history admitted with gangenous L great toe, s/p L fem-below knee popliteal artery BPG and L fem endarterectomy  07/20/16. PMH of CABG, right fem-below knee popliteal artery BPG and right fem endarterectomy on 05/21/16. who D/Cd to SNF and returned 6/1 with Right groin wound infection s/p debridement 6/3 and VAC placement   Clinical Impression   Pt admitted with above. She demonstrates the below listed deficits and will benefit from continued OT to maximize safety and independence with BADLs.  Pt requires mod A for LB ADLs and min A for functional transfers.  She is limited by pain.  Her sister is available to assist as needed at discharge.  Recommend HHOT.       Follow Up Recommendations  Home health OT;Supervision/Assistance - 24 hour    Equipment Recommendations  3 in 1 bedside commode    Recommendations for Other Services       Precautions / Restrictions Precautions Precautions: Fall      Mobility Bed Mobility               General bed mobility comments: up in chair   Transfers Overall transfer level: Needs assistance Equipment used: Rolling walker (2 wheeled) Transfers: Sit to/from UGI CorporationStand;Stand Pivot Transfers Sit to Stand: Min assist Stand pivot transfers: Min assist       General transfer comment: Min A to stand.  Limited by pain     Balance Overall balance assessment: Needs assistance Sitting-balance support: Feet supported Sitting balance-Leahy Scale: Good     Standing balance support: Bilateral upper extremity supported Standing balance-Leahy Scale: Poor Standing balance comment: reliant on UE support                            ADL either performed or assessed with clinical judgement   ADL Overall ADL's : Needs assistance/impaired Eating/Feeding: Independent    Grooming: Wash/dry hands;Wash/dry face;Oral care;Brushing hair;Set up;Sitting   Upper Body Bathing: Set up;Sitting   Lower Body Bathing: Moderate assistance;Sit to/from stand   Upper Body Dressing : Set up;Sitting   Lower Body Dressing: Moderate assistance;Sit to/from stand Lower Body Dressing Details (indicate cue type and reason): Pt able to reach down toward mid calf - limited by pain  Toilet Transfer: Minimal assistance;Stand-pivot;BSC;RW   Toileting- Clothing Manipulation and Hygiene: Minimal assistance;Sit to/from stand       Functional mobility during ADLs: Minimal assistance;Rolling walker General ADL Comments: Pt limited by pain      Vision         Perception     Praxis      Pertinent Vitals/Pain Pain Assessment: 0-10 Pain Score: 8  Pain Location: L leg pain  Pain Descriptors / Indicators: Grimacing;Guarding;Throbbing Pain Intervention(s): Limited activity within patient's tolerance;Premedicated before session     Hand Dominance Right   Extremity/Trunk Assessment Upper Extremity Assessment Upper Extremity Assessment: Overall WFL for tasks assessed   Lower Extremity Assessment Lower Extremity Assessment: Defer to PT evaluation   Cervical / Trunk Assessment Cervical / Trunk Assessment: Kyphotic   Communication Communication Communication: HOH   Cognition Arousal/Alertness: Awake/alert Behavior During Therapy: WFL for tasks assessed/performed Overall Cognitive Status: Within Functional Limits for tasks assessed  General Comments  VSS     Exercises     Shoulder Instructions      Home Living Family/patient expects to be discharged to:: Private residence Living Arrangements: Alone Available Help at Discharge: Family;Available 24 hours/day Type of Home: Apartment Home Access: Stairs to enter Entrance Stairs-Number of Steps: 1   Home Layout: Two level;Bed/bath upstairs;1/2 bath on main  level Alternate Level Stairs-Number of Steps: 13 Alternate Level Stairs-Rails: Right;Left Bathroom Shower/Tub: Tub/shower unit;Curtain   Bathroom Toilet: Standard Bathroom Accessibility: Yes   Home Equipment: Environmental consultant - 4 wheels;Other (comment);Bedside commode;Tub bench (4 wheeled walker on each floor)   Additional Comments: sister able to assist her at discharge       Prior Functioning/Environment Level of Independence: Independent with assistive device(s)        Comments: limited community ambulator and driver        OT Problem List: Decreased strength;Decreased activity tolerance;Impaired balance (sitting and/or standing);Decreased knowledge of use of DME or AE;Pain;Decreased safety awareness      OT Treatment/Interventions: Self-care/ADL training;DME and/or AE instruction;Therapeutic activities;Patient/family education;Balance training    OT Goals(Current goals can be found in the care plan section) Acute Rehab OT Goals Patient Stated Goal: go home OT Goal Formulation: With patient Time For Goal Achievement: 08/04/16 Potential to Achieve Goals: Good ADL Goals Pt Will Perform Grooming: with supervision;standing Pt Will Perform Lower Body Bathing: with supervision;with adaptive equipment;sit to/from stand Pt Will Perform Lower Body Dressing: with supervision;with adaptive equipment;sit to/from stand Pt Will Transfer to Toilet: with supervision;ambulating;regular height toilet;bedside commode;grab bars Pt Will Perform Toileting - Clothing Manipulation and hygiene: with supervision;sit to/from stand  OT Frequency: Min 2X/week   Barriers to D/C:            Co-evaluation              AM-PAC PT "6 Clicks" Daily Activity     Outcome Measure Help from another person eating meals?: None Help from another person taking care of personal grooming?: A Little Help from another person toileting, which includes using toliet, bedpan, or urinal?: A Little Help from another  person bathing (including washing, rinsing, drying)?: A Lot Help from another person to put on and taking off regular upper body clothing?: A Little Help from another person to put on and taking off regular lower body clothing?: A Lot 6 Click Score: 17   End of Session Equipment Utilized During Treatment: Rolling walker;Oxygen Nurse Communication: Mobility status  Activity Tolerance: Patient limited by pain Patient left: in chair;with call bell/phone within reach;with chair alarm set  OT Visit Diagnosis: Unsteadiness on feet (R26.81);Pain Pain - Right/Left: Left Pain - part of body: Leg                Time: 1610-9604 OT Time Calculation (min): 11 min Charges:  OT General Charges $OT Visit: 1 Procedure OT Evaluation $OT Eval Moderate Complexity: 1 Procedure G-Codes:     Jeani Hawking, OTR/L [phone]   Jeani Hawking M 07/21/2016, 4:53 PM

## 2016-07-21 NOTE — Progress Notes (Addendum)
  Progress Note    07/21/2016 7:19 AM 1 Day Post-Op  Subjective:  No complaints this morning  Tm 99.4 now 99.2 HR 90's-100's NSR/ST 100's-120's systolic 96% 2LO2NC  Vitals:   07/21/16 0332 07/21/16 0629  BP: 105/79 114/75  Pulse: 100 91  Resp: 20 14  Temp: 99.2 F (37.3 C)     Physical Exam: Cardiac:  regular Lungs:  Non labored Incisions:  Right groin with excellent granulation tissue; left groin with pravena in place; left BK incision is clean and dry Extremities:  Brisk doppler signals left PT>AT   CBC    Component Value Date/Time   WBC 8.0 07/20/2016 2100   RBC 1.94 (L) 07/20/2016 2100   HGB 5.6 (LL) 07/20/2016 2100   HGB 10.8 (L) 07/01/2016 1445   HCT 17.7 (L) 07/20/2016 2100   HCT 32.9 (L) 07/01/2016 1445   PLT 159 07/20/2016 2100   PLT 340 07/01/2016 1445   MCV 91.2 07/20/2016 2100   MCV 88 07/01/2016 1445   MCH 28.9 07/20/2016 2100   MCHC 31.6 07/20/2016 2100   RDW 15.0 07/20/2016 2100   RDW 15.5 (H) 07/01/2016 1445   LYMPHSABS 3.0 07/01/2016 1445   MONOABS 0.3 05/25/2016 0349   EOSABS 0.4 07/01/2016 1445   BASOSABS 0.0 07/01/2016 1445    BMET    Component Value Date/Time   NA 140 07/20/2016 2100   NA 140 04/14/2016 1431   K 3.6 07/20/2016 2100   CL 109 07/20/2016 2100   CO2 24 07/20/2016 2100   GLUCOSE 178 (H) 07/20/2016 2100   BUN 17 07/20/2016 2100   BUN 19 04/14/2016 1431   CREATININE 0.59 07/20/2016 2100   CALCIUM 7.0 (L) 07/20/2016 2100   GFRNONAA >60 07/20/2016 2100   GFRAA >60 07/20/2016 2100    INR    Component Value Date/Time   INR 0.93 07/16/2016 1322     Intake/Output Summary (Last 24 hours) at 07/21/16 0719 Last data filed at 07/21/16 [id number]  Gross per 24 hour  Intake          5371.67 ml  Output             2625 ml  Net          2746.67 ml     Assessment:  66 y.o. female is s/p:  1.  Left common femoral endarterectomy 2.  Left femoral to below knee popliteal artery bypass with 6mm ringed ptfe  1 Day  Post-Op  Plan: -pt with patent left bypass graft with brisk doppler signals PT>AT -acute surgical blood loss anemia-received 2 units PRBC's.  No labs drawn this am-will order cbc and bmp -right groin healing nicely-continue wet to dry dressings -DVT prophylaxis:  Lovenox-will start this afternoon -dc foley -mobilize; out of bed   Doreatha MassedSamantha Rhyne, PA-C [facility] [phone] 07/21/2016 7:19 AM   I have independently interviewed patient and agree with PA assessment and plan above. Responded to 2 units prbc's.   Dahlila Pfahler C. Randie Heinzain, MD [facility] [place] Office: [phone] Pager: [phone]

## 2016-07-21 NOTE — Care Management Note (Signed)
Case Management Note  Patient Details  Name: Elizebeth Brookingoni B Calder MRN: [id number] Date of Birth: 02/22/1950  Subjective/Objective:  From home alone, S/pLeft common femoral endarterectomy, left feoral to below knee popliteal artery bypass, received 2 units prbc's, she has a prevena incisional wound vac.  [facility] Child psychotherapistocial Worker, will need to add HHPT.  She has a PCP and medication coverage and transportation at dc.                 Action/Plan: NCM will follow for dc needs.   Expected Discharge Date:                  Expected Discharge Plan:     In-House Referral:     Discharge planning Services  CM Consult  Post Acute Care Choice:    Choice offered to:     DME Arranged:    DME Agency:     HH Arranged:    HH Agency:     Status of Service:  In process, will continue to follow  If discussed at Long Length of Stay Meetings, dates discussed:    Additional Comments:  Leone Havenaylor, Cadyn Rodger [place], RN 07/21/2016, 9:51 AM

## 2016-07-22 ENCOUNTER — Inpatient Hospital Stay (HOSPITAL_COMMUNITY): Payer: BLUE CROSS/BLUE SHIELD

## 2016-07-22 DIAGNOSIS — I70262 Atherosclerosis of native arteries of extremities with gangrene, left leg: Secondary | ICD-10-CM

## 2016-07-22 LAB — TYPE AND SCREEN
ABO/RH(D): A NEG
ANTIBODY SCREEN: NEGATIVE
UNIT DIVISION: 0
UNIT DIVISION: 0

## 2016-07-22 LAB — BPAM RBC
BLOOD PRODUCT EXPIRATION DATE: 201807232359
BLOOD PRODUCT EXPIRATION DATE: 201807232359
ISSUE DATE / TIME: 201807092204
ISSUE DATE / TIME: 201807100119
UNIT TYPE AND RH: 600
Unit Type and Rh: 600

## 2016-07-22 LAB — BASIC METABOLIC PANEL
ANION GAP: 9 (ref 5–15)
BUN: 13 mg/dL (ref 6–20)
CO2: 23 mmol/L (ref 22–32)
Calcium: 7.9 mg/dL — ABNORMAL LOW (ref 8.9–10.3)
Chloride: 105 mmol/L (ref 101–111)
Creatinine, Ser: 0.62 mg/dL (ref 0.44–1.00)
Glucose, Bld: 95 mg/dL (ref 65–99)
POTASSIUM: 3.7 mmol/L (ref 3.5–5.1)
SODIUM: 137 mmol/L (ref 135–145)

## 2016-07-22 LAB — CBC
HCT: 26.4 % — ABNORMAL LOW (ref 36.0–46.0)
Hemoglobin: 8.6 g/dL — ABNORMAL LOW (ref 12.0–15.0)
MCH: 28.7 pg (ref 26.0–34.0)
MCHC: 32.6 g/dL (ref 30.0–36.0)
MCV: 88 fL (ref 78.0–100.0)
Platelets: 175 K/uL (ref 150–400)
RBC: 3 MIL/uL — ABNORMAL LOW (ref 3.87–5.11)
RDW: 15.5 % (ref 11.5–15.5)
WBC: 13.2 K/uL — ABNORMAL HIGH (ref 4.0–10.5)

## 2016-07-22 LAB — GLUCOSE, CAPILLARY
GLUCOSE-CAPILLARY: 110 mg/dL — AB (ref 65–99)
GLUCOSE-CAPILLARY: 90 mg/dL (ref 65–99)
Glucose-Capillary: 75 mg/dL (ref 65–99)
Glucose-Capillary: 119 mg/dL — ABNORMAL HIGH (ref 65–99)

## 2016-07-22 NOTE — Care Management Note (Signed)
Case Management Note Previous CM note initiated by Leone Havenaylor, Deborah [place], RN--07/21/2016, 9:51 AM   Patient Details  Name: Andrea [name] MRN: [id number] Date of Birth: Jan 27, 1950  Subjective/Objective:  From home alone, S/pLeft common femoral endarterectomy, left feoral to below knee popliteal artery bypass, received 2 units prbc's, she has a prevena incisional wound vac.  [facility] and Child psychotherapistocial Worker, will need to add HHPT.  She has a PCP and medication coverage and transportation at dc.                 Action/Plan: NCM will follow for dc needs.   Expected Discharge Date:                  Expected Discharge Plan:  Home w Home Health Services  In-House Referral:     Discharge planning Services  CM Consult  Post Acute Care Choice:  Home Health, Resumption of Svcs/PTA Provider Choice offered to:  Patient  DME Arranged:  3-N-1 DME Agency:  NA  HH Arranged:  PT, OT HH Agency:  [facility]  Status of Service:  Completed, signed off  If discussed at Long Length of Stay Meetings, dates discussed:    Discharge Disposition: home/home health   Additional Comments:  07/22/16- 1100- Calissa Swenor RN, CM- orders placed for HHPT/OT and DME 3n1- spoke with pt at bedside confirmed she wanted to continue services [facility]- pt states she already has BSC at home and does not need 3n1 at this time- [facility] Cly[facility]Karen [facility] and adding HHPT/OT- CM will continue to follow for any further needs.   Darrold SpanWebster, Reve Crocket [place], RN 07/22/2016, 11:16 AM [phone]

## 2016-07-22 NOTE — Progress Notes (Signed)
VASCULAR LAB PRELIMINARY  ARTERIAL  ABI completed:    RIGHT    LEFT    PRESSURE WAVEFORM  PRESSURE WAVEFORM  BRACHIAL 132 Triphasic BRACHIAL 136 Triphasic  DP 94 biphasic DP 83 biphasic  PT 76 biphasic PT 61 monophasic  PER   PER    GREAT TOE  NA GREAT TOE  NA    RIGHT LEFT  ABI 0.69 0.61     Lida Berkery D, RVT 07/22/2016, 2:24 PM

## 2016-07-22 NOTE — Progress Notes (Addendum)
Progress Note    07/22/2016 7:27 AM 2 Days Post-Op  Subjective:  C/o back pain.  Says she was only out of bed one time with PT yesterday.  Says they did not remove her catheter.   Tm 100.2   Vitals:   07/22/16 0000 07/22/16 0400  BP: (!) 126/49   Pulse: 78   Resp: (!) 24   Temp: 99.7 F (37.6 C) 100.2 F (37.9 C)    Physical Exam: Cardiac:  regular Lungs:  Non labored Incisions:  BK incision is clean and dry Extremities:  Brisk left doppler signals DP/peroneal   CBC    Component Value Date/Time   WBC 13.2 (H) 07/22/2016 0415   RBC 3.00 (L) 07/22/2016 0415   HGB 8.6 (L) 07/22/2016 0415   HGB 10.8 (L) 07/01/2016 1445   HCT 26.4 (L) 07/22/2016 0415   HCT 32.9 (L) 07/01/2016 1445   PLT 175 07/22/2016 0415   PLT 340 07/01/2016 1445   MCV 88.0 07/22/2016 0415   MCV 88 07/01/2016 1445   MCH 28.7 07/22/2016 0415   MCHC 32.6 07/22/2016 0415   RDW 15.5 07/22/2016 0415   RDW 15.5 (H) 07/01/2016 1445   LYMPHSABS 3.0 07/01/2016 1445   MONOABS 0.3 05/25/2016 0349   EOSABS 0.4 07/01/2016 1445   BASOSABS 0.0 07/01/2016 1445    BMET    Component Value Date/Time   NA 137 07/22/2016 0415   NA 140 04/14/2016 1431   K 3.7 07/22/2016 0415   CL 105 07/22/2016 0415   CO2 23 07/22/2016 0415   GLUCOSE 95 07/22/2016 0415   BUN 13 07/22/2016 0415   BUN 19 04/14/2016 1431   CREATININE 0.62 07/22/2016 0415   CALCIUM 7.9 (L) 07/22/2016 0415   GFRNONAA >60 07/22/2016 0415   GFRAA >60 07/22/2016 0415    INR    Component Value Date/Time   INR 0.93 07/16/2016 1322     Intake/Output Summary (Last 24 hours) at 07/22/16 0727 Last data filed at 07/21/16 2047  Gross per 24 hour  Intake              320 ml  Output              600 ml  Net             -280 ml     Assessment:  66 y.o. female is s/p:  1. Left common femoral endarterectomy 2. Left femoral to below knee popliteal artery bypass with 6mm ringed ptfe   2 Days Post-Op  Plan: -pt with patent bypass  graft-brisk left DP/peroneal doppler signals -pt has only been out of bed once to work with PT, which is most likely the cause of her back pain as she sat up and her pain started feeling better.  I have asked the nurse to get her to the chair.  She needs continued PT.  Will order HHPT -dc foley-instructed RN to remove yesterday and was not done.  -DVT prophylaxis:  Lovenox -acute surgical blood loss anemia is stable -low grade fever and mild leukocytosis-continue IS and mobilization.  She did have this same issue after her surgery in May with fever and leukocytosis.  Will d/w Dr. Randie Heinzain about prophylactic ABx.  However, will continue with IS and mobilization for now.   Doreatha MassedSamantha Rhyne, PA-C [facility] [phone] 07/22/2016 7:27 AM   I have independently interviewed patient and agree with PA assessment and plan above.   Raileigh Sabater C. Randie Heinzain, MD [facility] [place] Office: [phone] Pager: [phone]

## 2016-07-23 LAB — GLUCOSE, CAPILLARY
GLUCOSE-CAPILLARY: 72 mg/dL (ref 65–99)
GLUCOSE-CAPILLARY: 132 mg/dL — AB (ref 65–99)
GLUCOSE-CAPILLARY: 97 mg/dL (ref 65–99)
Glucose-Capillary: 101 mg/dL — ABNORMAL HIGH (ref 65–99)
Glucose-Capillary: 85 mg/dL (ref 65–99)

## 2016-07-23 NOTE — Progress Notes (Addendum)
  Progress Note    07/23/2016 2:48 PM 3 Days Post-Op  Subjective:  Leg feeling much better this a.m.  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  CBC    Component Value Date/Time   WBC 13.2 (H) 07/22/2016 0415   RBC 3.00 (L) 07/22/2016 0415   HGB 8.6 (L) 07/22/2016 0415   HGB 10.8 (L) 07/01/2016 1445   HCT 26.4 (L) 07/22/2016 0415   HCT 32.9 (L) 07/01/2016 1445   PLT 175 07/22/2016 0415   PLT 340 07/01/2016 1445   MCV 88.0 07/22/2016 0415   MCV 88 07/01/2016 1445   MCH 28.7 07/22/2016 0415   MCHC 32.6 07/22/2016 0415   RDW 15.5 07/22/2016 0415   RDW 15.5 (H) 07/01/2016 1445   LYMPHSABS 3.0 07/01/2016 1445   MONOABS 0.3 05/25/2016 0349   EOSABS 0.4 07/01/2016 1445   BASOSABS 0.0 07/01/2016 1445    BMET    Component Value Date/Time   NA 137 07/22/2016 0415   NA 140 04/14/2016 1431   K 3.7 07/22/2016 0415   CL 105 07/22/2016 0415   CO2 23 07/22/2016 0415   GLUCOSE 95 07/22/2016 0415   BUN 13 07/22/2016 0415   BUN 19 04/14/2016 1431   CREATININE 0.62 07/22/2016 0415   CALCIUM 7.9 (L) 07/22/2016 0415   GFRNONAA >60 07/22/2016 0415   GFRAA >60 07/22/2016 0415    INR    Component Value Date/Time   INR 0.93 07/16/2016 1322     Intake/Output Summary (Last 24 hours) at 07/23/16 1448 Last data filed at 07/23/16 0830  Gross per 24 hour  Intake              230 ml  Output              300 ml  Net              -70 ml     Assessment:  [age] y.o. female is pod#3 left fem-pop with graft, abi improved to 0.6 bilaterally  Plan: Continue asa/plavix oob as tolerated hliv lovenox for dvt ppx Plan will be for home with home health nursing and PT on discharge    Domnique Vanegas C. Randie Heinzain, MD [facility] [place] Office: [phone] Pager: [phone]  07/23/2016 2:48 PM

## 2016-07-23 NOTE — Progress Notes (Signed)
Physical Therapy Treatment Patient Details Name: Andrea [name] MRN: [id number] DOB: 1950-05-21 Today's Date: 07/23/2016    History of Present Illness 66 year old female history admitted with gangenous L great toe, s/p L fem-below knee popliteal artery BPG and L fem endarterectomy  07/20/16. PMH of CABG, right fem-below knee popliteal artery BPG and right fem endarterectomy on 05/21/16. who D/Cd to SNF and returned 6/1 with Right groin wound infection s/p debridement 6/3 and VAC placement    PT Comments    Pt is making good progress towards her goals. Pt currently, minA for bed mobility and min guard for transfers and ambulation of 60 feet with RW. Pt requires skilled PT to progress gait training and to improve LE strength and endurance to safely navigate in her home environment at discharge.    Follow Up Recommendations  Home health PT;Supervision/Assistance - 24 hour     Equipment Recommendations  None recommended by PT    Recommendations for Other Services OT consult     Precautions / Restrictions Precautions Precautions: Fall    Mobility  Bed Mobility Overal bed mobility: Needs Assistance Bed Mobility: Supine to Sit     Supine to sit: Min assist     General bed mobility comments: minA for  pad scoot of hips to EoB  Transfers Overall transfer level: Needs assistance Equipment used: Rolling walker (2 wheeled) Transfers: Sit to/from Stand Sit to Stand: Min guard         General transfer comment: min guard for safety vc for hand placement and push off, good hand placement and lowering down to recliner  Ambulation/Gait Ambulation/Gait assistance: Min guard Ambulation Distance (Feet): 60 Feet Assistive device: Rolling walker (2 wheeled) Gait Pattern/deviations: Step-to pattern;Shuffle;Decreased stride length;Trunk flexed Gait velocity: slowed Gait velocity interpretation: Below normal speed for age/gender General Gait Details: minA for safety, vc for upright  posture and picking up L foot       Balance Overall balance assessment: Needs assistance Sitting-balance support: No upper extremity supported;Feet supported Sitting balance-Leahy Scale: Fair     Standing balance support: Bilateral upper extremity supported Standing balance-Leahy Scale: Fair Standing balance comment: requires RW assit                            Cognition Arousal/Alertness: Awake/alert Behavior During Therapy: WFL for tasks assessed/performed Overall Cognitive Status: Within Functional Limits for tasks assessed                                           General Comments General comments (skin integrity, edema, etc.): VSS      Pertinent Vitals/Pain Pain Assessment: Faces Faces Pain Scale: Hurts little more Pain Location: L leg pain  Pain Descriptors / Indicators: Guarding;Grimacing Pain Intervention(s): Monitored during session;Limited activity within patient's tolerance    Home Living Family/patient expects to be discharged to:: Private residence Living Arrangements: Alone Available Help at Discharge: Family;Available 24 hours/day Type of Home: Apartment Home Access: Stairs to enter   Home Layout: Two level;Bed/bath upstairs;1/2 bath on main level Home Equipment: Walker - 4 wheels;Other (comment);Bedside commode;Tub bench (4 wheeled walker on each floor)      Prior Function Level of Independence: Independent with assistive device(s)      Comments: limited community ambulator and driver   PT Goals (current goals can now be found in the care plan section)  Acute Rehab PT Goals Patient Stated Goal: go home PT Goal Formulation: With patient Progress towards PT goals: Progressing toward goals    Frequency    Min 3X/week      PT Plan Current plan remains appropriate       AM-PAC PT "6 Clicks" Daily Activity  Outcome Measure  Difficulty turning over in bed (including adjusting bedclothes, sheets and blankets)?: A  Little Difficulty moving from lying on back to sitting on the side of the bed? : A Lot Difficulty sitting down on and standing up from a chair with arms (e.g., wheelchair, bedside commode, etc,.)?: A Lot Help needed moving to and from a bed to chair (including a wheelchair)?: A Little Help needed walking in hospital room?: A Little Help needed climbing 3-5 steps with a railing? : A Lot 6 Click Score: 15    End of Session Equipment Utilized During Treatment: Gait belt Activity Tolerance: Patient tolerated treatment well Patient left: in chair;with call bell/phone within reach;with chair alarm set Nurse Communication: Mobility status PT Visit Diagnosis: Other abnormalities of gait and mobility (R26.89);Muscle weakness (generalized) (M62.81);Difficulty in walking, not elsewhere classified (R26.2)     Time: [id number] PT Time Calculation (min) (ACUTE ONLY): 26 min  Charges:  $Gait Training: 23-37 mins                    G Codes:       Shyane Fossum B. Beverely RisenVan Fleet PT, DPT Acute Rehabilitation  [phone] Pager [phone]     Elon Alaslizabeth B Van Fleet 07/23/2016, 4:31 PM

## 2016-07-24 LAB — GLUCOSE, CAPILLARY
GLUCOSE-CAPILLARY: 86 mg/dL (ref 65–99)
GLUCOSE-CAPILLARY: 106 mg/dL — AB (ref 65–99)
GLUCOSE-CAPILLARY: 153 mg/dL — AB (ref 65–99)
Glucose-Capillary: 168 mg/dL — ABNORMAL HIGH (ref 65–99)
Glucose-Capillary: 118 mg/dL — ABNORMAL HIGH (ref 65–99)

## 2016-07-24 NOTE — Progress Notes (Signed)
  Progress Note    07/24/2016 8:15 AM 4 Days Post-Op  Subjective:  No having pain, worried about going home  Vitals:   07/24/16 0420 07/24/16 0756  BP: (!) 125/53   Pulse: 72 81  Resp: (!) 24 (!) 21  Temp: 98.9 F (37.2 C)     Physical Exam: aaox3 Abdomen is soft Left groin with prevena Right groin with granulation tissue Left bk incision cdi Strong left AT signal, right peroneal  CBC    Component Value Date/Time   WBC 13.2 (H) 07/22/2016 0415   RBC 3.00 (L) 07/22/2016 0415   HGB 8.6 (L) 07/22/2016 0415   HGB 10.8 (L) 07/01/2016 1445   HCT 26.4 (L) 07/22/2016 0415   HCT 32.9 (L) 07/01/2016 1445   PLT 175 07/22/2016 0415   PLT 340 07/01/2016 1445   MCV 88.0 07/22/2016 0415   MCV 88 07/01/2016 1445   MCH 28.7 07/22/2016 0415   MCHC 32.6 07/22/2016 0415   RDW 15.5 07/22/2016 0415   RDW 15.5 (H) 07/01/2016 1445   LYMPHSABS 3.0 07/01/2016 1445   MONOABS 0.3 05/25/2016 0349   EOSABS 0.4 07/01/2016 1445   BASOSABS 0.0 07/01/2016 1445    BMET    Component Value Date/Time   NA 137 07/22/2016 0415   NA 140 04/14/2016 1431   K 3.7 07/22/2016 0415   CL 105 07/22/2016 0415   CO2 23 07/22/2016 0415   GLUCOSE 95 07/22/2016 0415   BUN 13 07/22/2016 0415   BUN 19 04/14/2016 1431   CREATININE 0.62 07/22/2016 0415   CALCIUM 7.9 (L) 07/22/2016 0415   GFRNONAA >60 07/22/2016 0415   GFRAA >60 07/22/2016 0415    INR    Component Value Date/Time   INR 0.93 07/16/2016 1322     Intake/Output Summary (Last 24 hours) at 07/24/16 0815 Last data filed at 07/24/16 0221  Gross per 24 hour  Intake              330 ml  Output              350 ml  Net              -20 ml     Assessment:  66 y.o. female is left femoral to bk popliteal bypass with graft for ischemic toe ulceration  Plan: Would need hhpt and hhrn but now undecided about going home Betadine to left affected toes Wet to dry right groin prevena until Monday dispo planning lovenox dvt ppx.   Shala Baumbach  C. Randie Heinzain, MD [facility] [place] Office: [phone] Pager: [phone]  07/24/2016 8:15 AM

## 2016-07-24 NOTE — Progress Notes (Signed)
Physical Therapy Treatment Patient Details Name: Andrea [name] MRN: [id number] DOB: 1950-10-15 Today's Date: 07/24/2016    History of Present Illness 66 year old female history admitted with gangenous L great toe, s/p L fem-below knee popliteal artery BPG and L fem endarterectomy  07/20/16. PMH of CABG, right fem-below knee popliteal artery BPG and right fem endarterectomy on 05/21/16. who D/Cd to SNF and returned 6/1 with Right groin wound infection s/p debridement 6/3 and VAC placement    PT Comments    Pt requires VC's and assistance with transfers and gait. Pt increased gait distance. Next tx to complete exercise program.   Follow Up Recommendations  Home health PT;Supervision/Assistance - 24 hour     Equipment Recommendations  None recommended by PT    Recommendations for Other Services OT consult     Precautions / Restrictions Precautions Precautions: Fall Restrictions Weight Bearing Restrictions: No    Mobility  Bed Mobility Overal bed mobility: Needs Assistance Bed Mobility: Supine to Sit;Sit to Supine     Supine to sit: Supervision;HOB elevated Sit to supine: Min guard   General bed mobility comments: Assistance with pad to scoot hips to top of bed  Transfers Overall transfer level: Needs assistance Equipment used: Rolling walker (2 wheeled) Transfers: Sit to/from Stand Sit to Stand: Mod assist         General transfer comment: VC's for hand placement, pt required boost getting OOB, forward overly flexed trunk while getting OOB to AD, posterior lean with standing  Ambulation/Gait Ambulation/Gait assistance: Min guard Ambulation Distance (Feet): 74 Feet Assistive device: Rolling walker (2 wheeled) Gait Pattern/deviations: Step-to pattern;Shuffle;Decreased stride length;Trunk flexed Gait velocity: slowed   General Gait Details: VC's for posture, looking forward   Stairs            Wheelchair Mobility    Modified Rankin (Stroke Patients  Only)       Balance Overall balance assessment: Needs assistance Sitting-balance support: No upper extremity supported;Feet supported Sitting balance-Leahy Scale: Fair     Standing balance support: Bilateral upper extremity supported;During functional activity Standing balance-Leahy Scale: Poor Standing balance comment: requires RW assit                            Cognition Arousal/Alertness: Awake/alert Behavior During Therapy: WFL for tasks assessed/performed Overall Cognitive Status: Within Functional Limits for tasks assessed                                        Exercises      General Comments        Pertinent Vitals/Pain Pain Assessment: Faces Faces Pain Scale: Hurts even more Pain Location: L leg pain  Pain Descriptors / Indicators: Guarding;Grimacing Pain Intervention(s): Limited activity within patient's tolerance;Monitored during session    Home Living                      Prior Function            PT Goals (current goals can now be found in the care plan section) Acute Rehab PT Goals Patient Stated Goal: go home PT Goal Formulation: With patient Progress towards PT goals: Progressing toward goals    Frequency    Min 3X/week      PT Plan Current plan remains appropriate    Co-evaluation  AM-PAC PT "6 Clicks" Daily Activity  Outcome Measure  Difficulty turning over in bed (including adjusting bedclothes, sheets and blankets)?: A Little Difficulty moving from lying on back to sitting on the side of the bed? : A Lot Difficulty sitting down on and standing up from a chair with arms (e.g., wheelchair, bedside commode, etc,.)?: A Lot Help needed moving to and from a bed to chair (including a wheelchair)?: A Little Help needed walking in hospital room?: A Little Help needed climbing 3-5 steps with a railing? : A Lot 6 Click Score: 15    End of Session Equipment Utilized During Treatment:  Gait belt Activity Tolerance: Patient tolerated treatment well Patient left: with call bell/phone within reach;with chair alarm set;in bed   PT Visit Diagnosis: Other abnormalities of gait and mobility (R26.89);Muscle weakness (generalized) (M62.81);Difficulty in walking, not elsewhere classified (R26.2)     Time: 1610-9604 PT Time Calculation (min) (ACUTE ONLY): 33 min  Charges:  $Gait Training: 8-22 mins $Therapeutic Activity: 8-22 mins                    G Codes:       Andrea [name], SPTA [id number]    Andrea [name] 07/24/2016, 4:58 PM

## 2016-07-24 NOTE — Clinical Social Work Note (Signed)
CSW met with patient to discuss possible SNF placement. She stated that she prefers to go home as long as insurance will pay for it so that she is in her own environment and her family can visit her. CSW explained that family can visit her at the facility. Patient stated that even so, she prefers to return home once discharged.  CSW signing off. Consult again if any other social work needs arise.  Andrea [name], [place]

## 2016-07-24 NOTE — Hospital Discharge Follow-Up (Signed)
The patient is known to the [facility] and has been active with the [facility] ( [facility]). Met with her this afternoon, She was sleepy but stated that she plans to return home.  She was agreeable to scheduling a follow up appointment with the [facility] at [facility] and an appointment was scheduled for 07/31/16 @ 094 and the information was placed on the AVS.    Message left for Marvetta Gibbons, RN CM regarding the plan for follow up.

## 2016-07-24 NOTE — Progress Notes (Signed)
Responded to request from nurse while on unit for another pt to visit this pt too. Provided emotional/spiritual support, ministry of presence, and prayer -- which pt appreciated. She said she'd prefer to go home when she can and have PT at home. Chaplain available for f/u.   07/24/16 1000  Clinical Encounter Type  Visited With Patient;Health care provider  Visit Type Initial;Psychological support;Spiritual support;Social support;Post-op  Referral From Nurse  Spiritual Encounters  Spiritual Needs Prayer;Emotional  Stress Factors  Patient Stress Factors Health changes;Loss of control  Ephraim Hamburgerynthia A Thomas Mabry, [address]

## 2016-07-25 LAB — GLUCOSE, CAPILLARY: GLUCOSE-CAPILLARY: 126 mg/dL — AB (ref 65–99)

## 2016-07-25 MED ORDER — METFORMIN HCL 1000 MG PO TABS: 1000.0000 mg | ORAL_TABLET | Freq: Two times a day (BID) | ORAL | 3 refills | Status: DC

## 2016-07-25 MED ORDER — HYDROCODONE-ACETAMINOPHEN 5-325 MG PO TABS: 1.0000 | ORAL_TABLET | Freq: Four times a day (QID) | ORAL | 0 refills | Status: DC | PRN

## 2016-07-25 NOTE — Progress Notes (Addendum)
  Progress Note  SUBJECTIVE:    POD #5  Ready to go home.   OBJECTIVE:   Vitals:   07/25/16 0000 07/25/16 0400  BP: 124/71 (!) 120/53  Pulse:    Resp: 16 19  Temp:  98.3 F (36.8 C)    Intake/Output Summary (Last 24 hours) at 07/25/16 0740 Last data filed at 07/24/16 1323  Gross per 24 hour  Intake              360 ml  Output              500 ml  Net             -140 ml   Right groin with red granulation tissue.  Left groin with incisional VAC Left below knee incision intact.  Strong left AT signal, strong right peroneal signal  ASSESSMENT/PLAN:   66 y.o. female is s/p:  left femoral to bk popliteal bypass with graft for ischemic toe ulceration 5 Days Post-Op   Wants to go home instead of rehab.  Home health services set up. Continue wet to dry dressings right groin. Patient to take prevena off left groin Monday. D/c home today.   Raymond GurneyKimberly A Trinh 07/25/2016 7:40 AM -- LABS:   CBC    Component Value Date/Time   WBC 13.2 (H) 07/22/2016 0415   HGB 8.6 (L) 07/22/2016 0415   HGB 10.8 (L) 07/01/2016 1445   HCT 26.4 (L) 07/22/2016 0415   HCT 32.9 (L) 07/01/2016 1445   PLT 175 07/22/2016 0415   PLT 340 07/01/2016 1445    BMET    Component Value Date/Time   NA 137 07/22/2016 0415   NA 140 04/14/2016 1431   K 3.7 07/22/2016 0415   CL 105 07/22/2016 0415   CO2 23 07/22/2016 0415   GLUCOSE 95 07/22/2016 0415   BUN 13 07/22/2016 0415   BUN 19 04/14/2016 1431   CREATININE 0.62 07/22/2016 0415   CALCIUM 7.9 (L) 07/22/2016 0415   GFRNONAA >60 07/22/2016 0415   GFRAA >60 07/22/2016 0415    COAG Lab Results  Component Value Date   INR 0.93 07/16/2016   INR 1.10 06/12/2016   INR 0.93 05/13/2016   No results found for: PTT  ANTIBIOTICS:   Anti-infectives    Start     Dose/Rate Route Frequency Ordered Stop   07/20/16 2000  cefUROXime (ZINACEF) 1.5 g in dextrose 5 % 50 mL IVPB     1.5 g 100 mL/hr over 30 Minutes Intravenous Every 12 hours 07/20/16  1550 07/21/16 1524   07/20/16 0606  dextrose 5 % with cefUROXime (ZINACEF) ADS Med    Comments:  Lorenda IshiharaGibbs, Bonnie   : cabinet override      07/20/16 0606 07/20/16 0744   07/20/16 0601  cefUROXime (ZINACEF) 1.5 g in dextrose 5 % 50 mL IVPB     1.5 g 100 mL/hr over 30 Minutes Intravenous 30 min pre-op 07/20/16 0601 07/20/16 0744       [name], PA-C [facility] Office: [phone] Pager: [phone] 07/25/2016 7:40 AM   I have interviewed patient with PA and agree with assessment and plan above.   Matthe Sloane C. Randie Heinzain, MD [facility] [place] Office: [phone] Pager: [phone]

## 2016-07-25 NOTE — Progress Notes (Signed)
Discharged to home with family office visits in place teaching done  

## 2016-07-25 NOTE — Care Management Note (Signed)
Case Management Note  Patient Details  Name: Andrea [name] MRN: [id number] Date of Birth: September 15, 1950  Subjective/Objective: [facility] pt to D/C Prevena from L groin Monday 07/27/2016 with no further care required. She is to continue wet to dry dressings to R groin and should not require HH assist per Faustino CongressK Trinh, with Vascular. CM added HHRN to assess x 1 in the Home per her recommendation.                   Action/Plan:CM will sign off for now but will be available should additional discharge needs arise or disposition change.    Expected Discharge Date:  07/25/16               Expected Discharge Plan:  Home w Home Health Services  In-House Referral:     Discharge planning Services  CM Consult  Post Acute Care Choice:  Home Health, Resumption of Svcs/PTA Provider Choice offered to:  Patient  DME Arranged:  3-N-1 DME Agency:  NA  HH Arranged:  PT, OT, RN HH Agency:  [facility]  Status of Service:  Completed, signed off  If discussed at Long Length of Stay Meetings, dates discussed:    Additional Comments:  Yvone NeuCrutchfield, Cyrah Mclamb M, RN 07/25/2016, 10:11 AM

## 2016-07-25 NOTE — Progress Notes (Signed)
Occupational Therapy Treatment Patient Details Name: Andrea [name] B Stephenson MRN: [id number] DOB: 10/31/1950 Today's Date: 07/25/2016    History of present illness 66 year old female history admitted with gangenous L great toe, s/p L fem-below knee popliteal artery BPG and L fem endarterectomy  07/20/16. PMH of CABG, right fem-below knee popliteal artery BPG and right fem endarterectomy on 05/21/16. who D/Cd to SNF and returned 6/1 with Right groin wound infection s/p debridement 6/3 and VAC placement   OT comments  Pt. Able to complete bed mobility and in room ambulation to simulate home environment in conjunction with LB dressing and toileting tasks.  Remains limited by pain.  States sister can help her initially.  Will continue to follow acutely.  Note possible d/c.    Follow Up Recommendations  Home health OT;Supervision/Assistance - 24 hour    Equipment Recommendations  3 in 1 bedside commode    Recommendations for Other Services      Precautions / Restrictions Precautions Precautions: Fall       Mobility Bed Mobility Overal bed mobility: Modified Independent Bed Mobility: Supine to Sit;Rolling;Sidelying to Sit Rolling: Modified independent (Device/Increase time) Sidelying to sit: Modified independent (Device/Increase time) Supine to sit: Modified independent (Device/Increase time)     General bed mobility comments: hob flat, exits on L side at home, able to get to eob without physical assistance  Transfers Overall transfer level: Needs assistance Equipment used: Rolling walker (2 wheeled) Transfers: Sit to/from [employer]GI CorporationStand;Stand Pivot Transfers Sit to Stand: Min assist Stand pivot transfers: Min assist       General transfer comment: cues for hand placement, walker management, not looking down during ambulation.  moves slow moaning the entire time due to pain. educated to breathe, relax shoulders, attempted to aide in posture changes to take weight off of BLES. pt. not consistent  with recommendations    Balance                                           ADL either performed or assessed with clinical judgement   ADL Overall ADL's : Needs assistance/impaired                     Lower Body Dressing: Set up;Sitting/lateral leans Lower Body Dressing Details (indicate cue type and reason): able to sit eob and bend down along with pulling LE up towards her to don and adjust socks Toilet Transfer: Minimal assistance;RW Toilet Transfer Details (indicate cue type and reason): simulated walking around bed to get to recliner approx. 20 steps Toileting- Clothing Manipulation and Hygiene: Minimal assistance;Sit to/from stand Toileting - Clothing Manipulation Details (indicate cue type and reason): simulated during functional mobility in room, pt.declined need to use the b.room     Functional mobility during ADLs: Minimal assistance;Rolling walker General ADL Comments: Pt limited by pain      Vision       Perception     Praxis      Cognition Arousal/Alertness: Awake/alert Behavior During Therapy: WFL for tasks assessed/performed Overall Cognitive Status: Within Functional Limits for tasks assessed                                          Exercises     Shoulder Instructions       General  Comments      Pertinent Vitals/ Pain       Pain Assessment: 0-10 Pain Score: 8  Pain Location: L leg Pain Descriptors / Indicators: Aching Pain Intervention(s): Limited activity within patient's tolerance;Monitored during session;Repositioned;Patient requesting pain meds-RN notified  Home Living                                          Prior Functioning/Environment              Frequency  Min 2X/week        Progress Toward Goals  OT Goals(current goals can now be found in the care plan section)  Progress towards OT goals: Progressing toward goals     Plan      Co-evaluation                  AM-PAC PT "6 Clicks" Daily Activity     Outcome Measure   Help from another person eating meals?: None Help from another person taking care of personal grooming?: A Little Help from another person toileting, which includes using toliet, bedpan, or urinal?: A Little Help from another person bathing (including washing, rinsing, drying)?: A Lot Help from another person to put on and taking off regular upper body clothing?: A Little Help from another person to put on and taking off regular lower body clothing?: A Lot 6 Click Score: 17    End of Session Equipment Utilized During Treatment: Gait belt;Rolling walker  OT Visit Diagnosis: Unsteadiness on feet (R26.81);Pain Pain - Right/Left: Left Pain - part of body: Leg   Activity Tolerance Patient limited by pain   Patient Left in chair;with call bell/phone within reach   Nurse Communication Other (comment) (pt. requested pain meds via call bell)        Time: 1610-9604 OT Time Calculation (min): 14 min  Charges: OT General Charges $OT Visit: 1 Procedure OT Treatments $Self Care/Home Management : 8-22 mins   Robet Leu, COTA/L 07/25/2016, 11:06 AM

## 2016-07-26 NOTE — Discharge Summary (Signed)
[facility] Discharge Summary  Andrea [name] Feb 29, 1950 66 y.o. female  [id number]  Admission Date: 07/20/2016  Discharge Date: 07/25/2016  Physician: Lemar LivingsBrandon Cain, MD  Admission Diagnosis: Peripheral Vascular Disease  I70.92  HPI:   This is a 66 y.o. female who presented for follow-up of right groin wound following right femoral to below-knee popliteal artery bypass. The bypass remains patent and she has strong signals on the right. Wounds on the right foot are healing of the foot is warm. 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 She is quit smoking. She does take Plavix as well as statin. She is now at home and his walking.  Hospital Course:  The patient was admitted to the hospital and taken to the operating room on 07/20/2016 and underwent:  1.  Left common femoral endarterectomy 2.  Left femoral to below knee popliteal artery bypass with 6mm ringed ptfe    The patient tolerated the procedure well and was transported to the PACU in stable condition.   She acute blood loss anemia with hgb of 6.1 that evening following surgery. There was no evidence of bleeding from surgical site. She was transfused 2 units of prbcs.   She had an appropriate response to her blood transfusion. She had a patent left bypass graft with brisk doppler signals of her left PT and AT. She did have a low grade fever with mild leukocytosis post-op. She had this same issue back in May after her other leg bypass surgery. Her right groin wound was clean with good granulation tissue. Her fever did resolve by the end of POD 2. Her left toe dry gangrene was stable.  The remainder of her hospitalization consisted of increasing mobilization. The patient was undecided on going home versus SNF but ultimately decided she wanted to go home with home health services. She was discharged home on 07/25/16 in good condition.  She was given instructions of wound care to her right groin as well as instructed to take off her incisional vac dressing on the left on 07/27/16.     CBC    Component Value Date/Time   WBC 13.2 (H) 07/22/2016 0415   RBC 3.00 (L) 07/22/2016 0415   HGB 8.6 (L) 07/22/2016 0415   HGB 10.8 (L) 07/01/2016 1445   HCT 26.4 (L) 07/22/2016 0415   HCT 32.9 (L) 07/01/2016 1445   PLT 175 07/22/2016 0415   PLT 340 07/01/2016 1445   MCV 88.0 07/22/2016 0415   MCV 88 07/01/2016 1445   MCH 28.7 07/22/2016 0415   MCHC 32.6 07/22/2016 0415   RDW 15.5 07/22/2016 0415   RDW 15.5 (H) 07/01/2016 1445   LYMPHSABS 3.0 07/01/2016 1445   MONOABS 0.3 05/25/2016 0349   EOSABS 0.4 07/01/2016 1445   BASOSABS 0.0 07/01/2016 1445    BMET    Component Value Date/Time   NA 137 07/22/2016 0415   NA 140 04/14/2016 1431   K 3.7 07/22/2016 0415   CL 105 07/22/2016 0415   CO2 23 07/22/2016 0415   GLUCOSE 95 07/22/2016 0415   BUN 13 07/22/2016 0415   BUN 19 04/14/2016 1431   CREATININE 0.62 07/22/2016 0415   CALCIUM 7.9 (L) 07/22/2016 0415   GFRNONAA >60 07/22/2016 0415   GFRAA >60 07/22/2016 0415     Discharge Instructions:   The patient is discharged to home with extensive instructions on wound care and progressive ambulation.  They are instructed not to drive or perform any heavy lifting until returning to see the physician in his office.  Discharge Instructions    Call MD for:  redness, tenderness, or signs of infection (pain, swelling, bleeding, redness, odor or green/yellow discharge around incision site)    Complete by:  As directed    Call MD for:  severe or increased pain, loss or decreased feeling  in affected limb(s)    Complete by:  As directed    Call MD for:  temperature >100.5    Complete by:  As directed    Discharge wound care:    Complete by:  As directed    Place moist gauze with saline to right groin. Cover with dry gauze and tape in place. Do this dressing change twice a day.    Take off left groin dressing (just peel off) on Monday 7/16. Keep left groin clean by washing daily with soap and water. Place dry gauze to left groin daily to wick moisture. Do not put any neosporin or lotions there.   Driving Restrictions    Complete by:  As directed    No driving for 4 weeks   Increase activity slowly    Complete by:  As directed    Walk with assistance use walker or cane as needed   Lifting restrictions    Complete by:  As directed    No lifting for 4 weeks   Resume previous diet    Complete by:  As directed       Discharge Diagnosis:  Peripheral Vascular Disease  I70.92  Secondary Diagnosis: Patient Active Problem List   Diagnosis Date Noted  . Atherosclerosis of native arteries of extremities with gangrene, left leg (HCC) 07/20/2016  . GERD (gastroesophageal reflux disease) 07/01/2016  . Adjustment reaction with anxiety and depression 07/01/2016  . Wound infection 06/12/2016  . CAD (coronary artery disease) of artery bypass graft 04/15/2016  . Coronary artery disease involving coronary bypass graft of native heart with angina pectoris (HCC)   . Coronary artery disease involving native coronary artery of native heart without angina pectoris 04/07/2016  . PAD (peripheral artery disease) (HCC) 03/30/2016  . Diabetic foot ulcers (HCC) 03/30/2016  . Hyperlipidemia 03/30/2016  . Diabetic neuropathy (HCC) 02/24/2016  . Tobacco abuse 02/24/2016  . Overactive bladder 01/14/2016  . Diabetes mellitus without complication (HCC) 10/18/2006  . Essential hypertension 10/18/2006  . LOW BACK PAIN, CHRONIC 10/18/2006   Past Medical History:  Diagnosis Date  . Asthma   . Coronary artery disease   . Diabetes mellitus without complication (HCC)   . Dyspnea   . Fibromyalgia   . GERD (gastroesophageal reflux disease)   . High cholesterol   . Hypertension   . PONV (postoperative nausea and vomiting)   . PVD (peripheral vascular disease) (HCC)   . Urinary frequency    . Urinary incontinence      Allergies as of 07/25/2016      Reactions   Naproxen Itching   Tramadol Hcl Itching      Medication List    TAKE these medications   acetaminophen 325 MG tablet Commonly known as:  TYLENOL Take 325-650 mg by mouth every 6 (six) hours as needed (for pain.).   albuterol 108 (90 Base) MCG/ACT inhaler Commonly known as:  PROVENTIL HFA;VENTOLIN HFA Inhale 2 puffs into the lungs every 6 (six) hours as needed for wheezing or shortness of breath.   aspirin EC 81 MG tablet Take 1 tablet (  81 mg total) by mouth daily.   atorvastatin 80 MG tablet Commonly known as:  LIPITOR Take 1 tablet (80 mg total) by mouth daily. What changed:  when to take this   carbamide peroxide 6.5 % OTIC solution Commonly known as:  DEBROX Place 5 drops into both ears daily as needed (wax buildup).   citalopram 20 MG tablet Commonly known as:  CELEXA Take 1 tablet (20 mg total) by mouth daily.   clopidogrel 75 MG tablet Commonly known as:  PLAVIX Take 1 tablet (75 mg total) by mouth daily with breakfast.   cyclobenzaprine 5 MG tablet Commonly known as:  FLEXERIL Take 5 mg by mouth every 6 (six) hours as needed (pain/muscle spams.).   Fluticasone-Salmeterol 100-50 MCG/DOSE Aepb Commonly known as:  ADVAIR Inhale 1 puff into the lungs 2 (two) times daily.   gabapentin 300 MG capsule Commonly known as:  NEURONTIN Take 2 capsules (600 mg total) by mouth 3 (three) times daily.   hydrochlorothiazide 25 MG tablet Commonly known as:  HYDRODIURIL Take 25 mg by mouth daily.   HYDROcodone-acetaminophen 5-325 MG tablet Commonly known as:  NORCO/VICODIN Take 1 tablet by mouth every 6 (six) hours as needed for moderate pain.   ibuprofen 200 MG tablet Commonly known as:  ADVIL,MOTRIN Take 400 mg by mouth every 6 (six) hours as needed.   Insulin Glargine 100 UNIT/ML Solostar Pen Commonly known as:  LANTUS SOLOSTAR Inject 30 Units into the skin at bedtime.   lisinopril 5  MG tablet Commonly known as:  PRINIVIL,ZESTRIL Take 1 tablet (5 mg total) by mouth daily.   metFORMIN 1000 MG tablet Commonly known as:  GLUCOPHAGE Take 1 tablet (1,000 mg total) by mouth 2 (two) times daily with a meal. Restart 48 hrs after discharge. What changed:  when to take this   metoprolol tartrate 25 MG tablet Commonly known as:  LOPRESSOR Take 0.5 tablets (12.5 mg total) by mouth 2 (two) times daily.   oxybutynin 5 MG tablet Commonly known as:  DITROPAN TAKE 1 TABLET BY MOUTH 2 TIMES DAILY.   pantoprazole 20 MG tablet Commonly known as:  PROTONIX Take 1 tablet (20 mg total) by mouth daily.   promethazine 25 MG tablet Commonly known as:  PHENERGAN Take 12.5-25 mg by mouth every 6 (six) hours as needed for nausea or vomiting.       Vicodin #30 No Refill  Disposition: Home  Patient's condition: is Good  Follow up: 1. Dr. Randie Heinz in 2 weeks  Maris Berger, PA-C [facility] [phone] 07/26/2016  10:08 AM  - For VQI Registry use --- Instructions: Press F2 to tab through selections.  Delete question if not applicable.   Post-op:  Wound infection: No  Graft infection: No  Transfusion: Yes  If yes, 2 units given New Arrhythmia: No Ipsilateral amputation: No, [ ]  Minor, [ ]  BKA, [ ]  AKA Discharge patency: [ x] Primary, [ ]  Primary assisted, [ ]  Secondary, [ ]  Occluded Patency judged by: [x ] Dopper only, [ ]  Palpable graft pulse, [ ]  Palpable distal pulse, [ ]  ABI inc. > 0.15, [ ]  Duplex Discharge ABI: R 0.69, L 0.61 D/C Ambulatory Status: Ambulatory with Assistance  Complications: MI: No, [ ]  Troponin only, [ ]  EKG or Clinical CHF: No Resp failure:No, [ ]  Pneumonia, [ ]  Ventilator Chg in renal function: No, [ ]  Inc. Cr > 0.5, [ ]  Temp. Dialysis, [ ]  Permanent dialysis Stroke: No, [ ]  Minor, [ ]  Major Return  to OR: No  Reason for return to OR: [ ]  Bleeding, [ ]  Infection, [ ]  Thrombosis, [ ]  Revision  Discharge medications: Statin  use:  yes ASA use:  yes Plavix use:  yes Beta blocker use: yes Coumadin use: no

## 2016-07-27 ENCOUNTER — Telehealth: Payer: Self-pay | Admitting: Vascular Surgery

## 2016-07-27 ENCOUNTER — Telehealth: Payer: Self-pay

## 2016-07-27 NOTE — Telephone Encounter (Signed)
-----   Message from Sharee PimpleMarilyn K McChesney, RN sent at 07/26/2016  8:02 PM EDT ----- Regarding: 2 weeks   ----- Message ----- From: Raymond Gurneyrinh, Kimberly A, PA-C Sent: 07/25/2016   7:42 AM To: [facility] Charge Pool  S/p left femoral to below knee popliteal bypass 07/20/16  F/u with Dr. Randie Heinzain 2 weeks from now.  Thanks Selena BattenKim

## 2016-07-27 NOTE — Telephone Encounter (Signed)
Sched appt 08/21/16 at 11:15. Spoke to pt.

## 2016-07-27 NOTE — Telephone Encounter (Addendum)
[facility]itional [facility] Clinic Post-discharge Follow-Up Phone Call:  Date of Discharge:07/25/2016 Principal Discharge Diagnosis(es): PVD, s/p left femoral to below the knee popliteal artery bypass.  Post-discharge Communication: (Clearly document all attempts clearly and date contact made) call placed to the patient Call Completed: Yes                    With Whom: Patient Interpreter Needed: no     Please check all that apply:  X  Patient is knowledgeable of his/her condition(s) and/or treatment. X  Patient is caring for self at home.  - She stated that her sister helps when she can. The patient stated that she has plenty of food. ? Patient is receiving assist at home from family and/or caregiver. Family and/or caregiver is knowledgeable of patient's condition(s) and/or treatment. X  Patient is receiving home health services. If so, name of agency. - [facility].  The patient stated that the home health nurse came to see her today. She said that the home health nurse removed her VAC and performed her wound care. The patient said that she does not know what to do with the VAC.  The patient also correctly explained how to do her wound care to bilateral groins. Informed her that this CM would contact her home health nurse to check on the wound VAC status.  [facility] spoke to patient's nurse, Dondra SpryGail, who said that she had [facility] and [facility] the doctor when she sees him.  Call placed to the patient and informed her that as per Dondra SpryGail, she [facility] to her doctor's office.  The patient said that she will take  Dr Darcella Cheshireain's office but she just needs to confirm her appointment date/time.      Medication Reconciliation:  ? Medication list reviewed with patient. X  Patient obtained all discharge medications. If not, why? She stated that she has all of her medications and has been taking them as ordered. She did not want to review the list  at this time. She did not have any questions about her medications.  She said that she is able to control her pain with the norco and gabapentin taken as ordered.   Activities of Daily Living:       Independent  X  Needs assist (describe; ? home DME used) - has been using her cane for ambulation.  ? [facility] (describe, ? home DME used)   Community resources in place for patient:  ? None  X  Home Health/Home DME - [facility] ? Assisted Living ? Support Group                  Questions/Concerns discussed: No other questions /concerns reported at this time. [facility] 0945. She said that she will need to check on transportation but may need a cab. She has been instructed not to drive at this time.

## 2016-07-29 ENCOUNTER — Telehealth: Payer: Self-pay | Admitting: Family Medicine

## 2016-07-29 NOTE — Telephone Encounter (Signed)
Call placed to patient [phone] to confirm her appointment for this Friday. Pt stated that she will be coming and that she also needed help with transportation.  Call placed to [employer], spoke with Alcario DroughtErica and a reservation was made for this Friday 7/20 to have someone pick up patient at 9:15am and bring her to her appt.

## 2016-07-30 ENCOUNTER — Ambulatory Visit: Payer: BLUE CROSS/BLUE SHIELD | Admitting: Cardiovascular Disease

## 2016-07-30 ENCOUNTER — Other Ambulatory Visit: Payer: BLUE CROSS/BLUE SHIELD

## 2016-07-31 ENCOUNTER — Encounter: Payer: Self-pay | Admitting: Family Medicine

## 2016-07-31 ENCOUNTER — Telehealth: Payer: Self-pay | Admitting: *Deleted

## 2016-07-31 ENCOUNTER — Ambulatory Visit: Payer: BLUE CROSS/BLUE SHIELD | Attending: Family Medicine | Admitting: Family Medicine

## 2016-07-31 VITALS — BP 110/72 | HR 62 | Temp 98.9°F | Resp 18 | Ht 62.0 in | Wt 136.2 lb

## 2016-07-31 DIAGNOSIS — E1152 Type 2 diabetes mellitus with diabetic peripheral angiopathy with gangrene: Secondary | ICD-10-CM | POA: Insufficient documentation

## 2016-07-31 DIAGNOSIS — J45909 Unspecified asthma, uncomplicated: Secondary | ICD-10-CM | POA: Diagnosis not present

## 2016-07-31 DIAGNOSIS — E7800 Pure hypercholesterolemia, unspecified: Secondary | ICD-10-CM | POA: Diagnosis not present

## 2016-07-31 DIAGNOSIS — I70262 Atherosclerosis of native arteries of extremities with gangrene, left leg: Secondary | ICD-10-CM | POA: Diagnosis not present

## 2016-07-31 DIAGNOSIS — Z951 Presence of aortocoronary bypass graft: Secondary | ICD-10-CM | POA: Diagnosis not present

## 2016-07-31 DIAGNOSIS — Z888 Allergy status to other drugs, medicaments and biological substances status: Secondary | ICD-10-CM | POA: Insufficient documentation

## 2016-07-31 DIAGNOSIS — L97518 Non-pressure chronic ulcer of other part of right foot with other specified severity: Secondary | ICD-10-CM

## 2016-07-31 DIAGNOSIS — Z7982 Long term (current) use of aspirin: Secondary | ICD-10-CM | POA: Insufficient documentation

## 2016-07-31 DIAGNOSIS — K219 Gastro-esophageal reflux disease without esophagitis: Secondary | ICD-10-CM | POA: Diagnosis not present

## 2016-07-31 DIAGNOSIS — E1151 Type 2 diabetes mellitus with diabetic peripheral angiopathy without gangrene: Secondary | ICD-10-CM | POA: Insufficient documentation

## 2016-07-31 DIAGNOSIS — Z794 Long term (current) use of insulin: Secondary | ICD-10-CM | POA: Diagnosis not present

## 2016-07-31 DIAGNOSIS — Z7902 Long term (current) use of antithrombotics/antiplatelets: Secondary | ICD-10-CM | POA: Insufficient documentation

## 2016-07-31 DIAGNOSIS — E11621 Type 2 diabetes mellitus with foot ulcer: Secondary | ICD-10-CM

## 2016-07-31 DIAGNOSIS — I25700 Atherosclerosis of coronary artery bypass graft(s), unspecified, with unstable angina pectoris: Secondary | ICD-10-CM | POA: Diagnosis not present

## 2016-07-31 DIAGNOSIS — L089 Local infection of the skin and subcutaneous tissue, unspecified: Secondary | ICD-10-CM

## 2016-07-31 DIAGNOSIS — I10 Essential (primary) hypertension: Secondary | ICD-10-CM

## 2016-07-31 DIAGNOSIS — E1165 Type 2 diabetes mellitus with hyperglycemia: Secondary | ICD-10-CM | POA: Diagnosis not present

## 2016-07-31 DIAGNOSIS — Z955 Presence of coronary angioplasty implant and graft: Secondary | ICD-10-CM | POA: Insufficient documentation

## 2016-07-31 DIAGNOSIS — I739 Peripheral vascular disease, unspecified: Secondary | ICD-10-CM | POA: Diagnosis not present

## 2016-07-31 DIAGNOSIS — I96 Gangrene, not elsewhere classified: Secondary | ICD-10-CM | POA: Diagnosis not present

## 2016-07-31 DIAGNOSIS — L97529 Non-pressure chronic ulcer of other part of left foot with unspecified severity: Secondary | ICD-10-CM | POA: Insufficient documentation

## 2016-07-31 DIAGNOSIS — M797 Fibromyalgia: Secondary | ICD-10-CM | POA: Insufficient documentation

## 2016-07-31 DIAGNOSIS — I25110 Atherosclerotic heart disease of native coronary artery with unstable angina pectoris: Secondary | ICD-10-CM | POA: Insufficient documentation

## 2016-07-31 DIAGNOSIS — E1149 Type 2 diabetes mellitus with other diabetic neurological complication: Secondary | ICD-10-CM

## 2016-07-31 DIAGNOSIS — T148XXA Other injury of unspecified body region, initial encounter: Secondary | ICD-10-CM | POA: Diagnosis not present

## 2016-07-31 DIAGNOSIS — F4323 Adjustment disorder with mixed anxiety and depressed mood: Secondary | ICD-10-CM | POA: Diagnosis not present

## 2016-07-31 DIAGNOSIS — Z9889 Other specified postprocedural states: Secondary | ICD-10-CM | POA: Diagnosis not present

## 2016-07-31 LAB — GLUCOSE, POCT (MANUAL RESULT ENTRY): POC Glucose: 233 mg/dl — AB (ref 70–99)

## 2016-07-31 MED ORDER — POTASSIUM CHLORIDE ER 10 MEQ PO TBCR: 10.0000 meq | EXTENDED_RELEASE_TABLET | Freq: Every day | ORAL | 1 refills | Status: DC

## 2016-07-31 MED ORDER — ACETAMINOPHEN-CODEINE #3 300-30 MG PO TABS: 1.0000 | ORAL_TABLET | Freq: Three times a day (TID) | ORAL | 0 refills | Status: DC | PRN

## 2016-07-31 MED ORDER — FUROSEMIDE 20 MG PO TABS: 20.0000 mg | ORAL_TABLET | Freq: Every day | ORAL | 1 refills | Status: DC

## 2016-07-31 MED FILL — POTASSIUM CL 10 MEQ TAB SA: 10 | 30 days supply | Qty: 30 | Fill #0

## 2016-07-31 MED FILL — ACETAMINOPHEN/COD #3 TABLET: 300-30 | 20 days supply | Qty: 60 | Fill #0

## 2016-07-31 MED FILL — FUROSEMIDE 20 MG TABLET: 20 | 30 days supply | Qty: 30 | Fill #0

## 2016-07-31 NOTE — Progress Notes (Signed)
Transitional care clinic  Hospitalization dates: 07/20/16 through 07/25/16 Subjective:    Patient ID: Andrea [name], female    DOB: 02-20-1950, 66 y.o.   MRN: [id number]  HPI She is a 66 year old female with a history of uncontrolled type 2 diabetes mellitus (A1c 8.5 down from 8.9 previously), hypertension,Coronary artery disease status post CABG in 2006, status post stent in 2000  peripheral vascular disease  Status post left common femoral endarterectomy and left femoral to Low knee popliteal artery bypass on 07/30/16.  Postoperative period was complicated by low grade postoperative fever and leukocytosis for which she received antibiotics and she received 2 units PRBC. Wound VAC was placed and she was subsequently discharged after her condition  stabilized.  On 06/14/16 she was hospitalized for debridement of dry eschar debrided fat necrosis and wound vac placement by Dr. Darrick PennaFields on 06/14/2016.  She had a revious right femoral endarterectomy and femoral to below-knee popliteal artery bypass with graft on 05/21/16  Wound VAC has since been discontinued and she has been seen by home health since discharge. She Feels tired today due to inadequate sleep last night and complains of bilateral pedal edema. She has been compliant with all medications and fasting sugars have been in the 120 to 130 range. She has shooting pains in her left leg all the way to her groin and will be running out of her Vicodin.   Past Medical History:  Diagnosis Date  . Asthma   . Coronary artery disease   . Diabetes mellitus without complication (HCC)   . Dyspnea   . Fibromyalgia   . GERD (gastroesophageal reflux disease)   . High cholesterol   . Hypertension   . PONV (postoperative nausea and vomiting)   . PVD (peripheral vascular disease) (HCC)   . Urinary frequency   . Urinary incontinence     Past Surgical History:  Procedure Laterality Date  . ABDOMINAL AORTOGRAM W/LOWER EXTREMITY N/A 03/25/2016   Procedure: Abdominal Aortogram w/Lower Extremity;  Surgeon: Maeola HarmanBrandon Christopher Cain, MD;  [facility];  Service: Cardiovascular;  Laterality: N/A;  . APPLICATION OF WOUND VAC Right 06/14/2016   Procedure: APPLICATION OF WOUND VAC;  Surgeon: Sherren KernsFields, Charles E, MD;  [facility];  Service: Vascular;  Laterality: Right;  . CORONARY ARTERY BYPASS GRAFT    . CORONARY STENT INTERVENTION N/A 04/15/2016   Procedure: Coronary Stent Intervention;  Surgeon: Lennette Biharihomas A Kelly, MD;  Location: [facility];  Service: Cardiovascular;  Laterality: N/A;  . ENDARTERECTOMY FEMORAL Right 05/21/2016   Procedure: RIGHT FEMORAL ENDARTERECTOMY;  Surgeon: Maeola Harmanain, Brandon Christopher, MD;  [facility];  Service: Vascular;  Laterality: Right;  . ENDARTERECTOMY FEMORAL Left 07/20/2016   Procedure: LEFT FEMORAL ENDARTERECTOMY;  Surgeon: Maeola Harmanain, Brandon Christopher, MD;  [facility];  Service: Vascular;  Laterality: Left;  . FEMORAL BYPASS Left 07/20/2016   Left common femoral endarterectomy  . FEMORAL-POPLITEAL BYPASS GRAFT Right 05/21/2016   Procedure: RIGHT FEMORAL-BELOW KNEE POPLITEAL ARTERY BYPASS GRAFT USING PROPATEN GORETEX GRAFT;  Surgeon: Maeola Harmanain, Brandon Christopher, MD;  [facility];  Service: Vascular;  Laterality: Right;  . FEMORAL-POPLITEAL BYPASS GRAFT Left 07/20/2016   Procedure: LEFT FEMORAL TO BELOW KNEE POPLITEAL ARTERY  BYPASS GRAFT;  Surgeon: Maeola Harmanain, Brandon Christopher, MD;  [facility];  Service: Vascular;  Laterality: Left;  . GROIN DEBRIDEMENT Right 06/14/2016   Procedure: GROIN DEBRIDEMENT;  Surgeon: Sherren KernsFields, Charles E, MD;  [facility];  Service: Vascular;  Laterality: Right;  . KNEE  SURGERY    . LEFT HEART CATH AND CORS/GRAFTS ANGIOGRAPHY N/A 04/15/2016   Procedure: Left Heart Cath and Cors/Grafts Angiography;  Surgeon: Lennette Bihari, MD;  [facility]: [facility];  Service: Cardiovascular;  Laterality: N/A;  . MULTIPLE TOOTH EXTRACTIONS      Allergies  Allergen  Reactions  . Naproxen Itching  . Tramadol Hcl Itching    Current Outpatient Prescriptions on File Prior to Visit  Medication Sig Dispense Refill  . albuterol (PROVENTIL HFA;VENTOLIN HFA) 108 (90 Base) MCG/ACT inhaler Inhale 2 puffs into the lungs every 6 (six) hours as needed for wheezing or shortness of breath. 54 Inhaler 3  . aspirin EC 81 MG tablet Take 1 tablet (81 mg total) by mouth daily. 90 tablet 3  . atorvastatin (LIPITOR) 80 MG tablet Take 1 tablet (80 mg total) by mouth daily. (Patient taking differently: Take 80 mg by mouth at bedtime. ) 90 tablet 3  . carbamide peroxide (DEBROX) 6.5 % otic solution Place 5 drops into both ears daily as needed (wax buildup).    . citalopram (CELEXA) 20 MG tablet Take 1 tablet (20 mg total) by mouth daily. 30 tablet 3  . clopidogrel (PLAVIX) 75 MG tablet Take 1 tablet (75 mg total) by mouth daily with breakfast. 90 tablet 3  . cyclobenzaprine (FLEXERIL) 5 MG tablet Take 5 mg by mouth every 6 (six) hours as needed (pain/muscle spams.).   0  . Fluticasone-Salmeterol (ADVAIR) 100-50 MCG/DOSE AEPB Inhale 1 puff into the lungs 2 (two) times daily. 180 each 3  . gabapentin (NEURONTIN) 300 MG capsule Take 2 capsules (600 mg total) by mouth 3 (three) times daily. 60 capsule 3  . hydrochlorothiazide (HYDRODIURIL) 25 MG tablet Take 25 mg by mouth daily.  0  . HYDROcodone-acetaminophen (NORCO/VICODIN) 5-325 MG tablet Take 1 tablet by mouth every 6 (six) hours as needed for moderate pain. 30 tablet 0  . ibuprofen (ADVIL,MOTRIN) 200 MG tablet Take 400 mg by mouth every 6 (six) hours as needed.    . Insulin Glargine (LANTUS SOLOSTAR) 100 UNIT/ML Solostar Pen Inject 30 Units into the skin at bedtime. 5 pen 5  . lisinopril (PRINIVIL,ZESTRIL) 5 MG tablet Take 1 tablet (5 mg total) by mouth daily. 30 tablet 5  . metFORMIN (GLUCOPHAGE) 1000 MG tablet Take 1 tablet (1,000 mg total) by mouth 2 (two) times daily with a meal. Restart 48 hrs after discharge. 60 tablet 3  .  metoprolol tartrate (LOPRESSOR) 25 MG tablet Take 0.5 tablets (12.5 mg total) by mouth 2 (two) times daily. 90 tablet 3  . oxybutynin (DITROPAN) 5 MG tablet TAKE 1 TABLET BY MOUTH 2 TIMES DAILY. 60 tablet 2  . pantoprazole (PROTONIX) 20 MG tablet Take 1 tablet (20 mg total) by mouth daily. 30 tablet 1  . promethazine (PHENERGAN) 25 MG tablet Take 12.5-25 mg by mouth every 6 (six) hours as needed for nausea or vomiting.     No current facility-administered medications on file prior to visit.       Review of Systems Constitutional: Negative for activity change, appetite change and positive for fatigue.  HENT: Negative for congestion, sinus pressure and sore throat.   Eyes: Negative for visual disturbance.  Respiratory: Negative for cough, chest tightness, shortness of breath and wheezing.   Cardiovascular: Negative for chest pain and palpitations.  Gastrointestinal: Negative for abdominal distention, abdominal pain and constipation.  Endocrine: Negative for polydipsia.  Genitourinary: Negative for dysuria and frequency.  Musculoskeletal: Negative for arthralgias and back  pain.  Skin: Positive for wound. Negative for rash.  Neurological: Positive for numbness. Negative for tremors and light-headedness.  Hematological: Does not bruise/bleed easily.  Psychiatric/Behavioral: Negative for agitation, behavioral problems and suicidal ideas.      Objective: Vitals:   07/31/16 0950  BP: 110/72  Pulse: 62  Resp: 18  Temp: 98.9 F (37.2 C)  TempSrc: Oral  SpO2: 97%  Weight: 136 lb 3.2 oz (61.8 kg)  Height: 5\' 2"  (1.575 m)      Physical Exam  Constitutional: She is oriented to person, place, and time. She appears well-developed and well-nourished.  HEENT: Slight maxillary sinus tenderness on the left, right TM occluded by cerumen, left TM normal, normal oropharynx Cardiovascular: Normal rate and normal heart sounds.   No murmur heard. Unable to palpate dorsalis pedis  or posterior  tibial Pulmonary/Chest: Effort normal and breath sounds normal. She has no wheezes. She has no rales. She exhibits no tenderness.  Abdominal: Soft. Bowel sounds are normal. She exhibits no distension and no mass. There is no tenderness.  Musculoskeletal: Normal range of motion. She exhibits 1+ bilateral pitting edema.  dry gangrene of left big toe with slight serous drainage on the medial aspect  Neurological: She is alert and oriented to person, place, and time.  Skin: Ulcer in both groin with granulation tissue; lateral aspect of left leg with old diabetic, superficial scab Psych: Normal      Assessment & Plan:  1. DM type 2 with diabetic peripheral neuropathy (HCC) A1c of 8.5 which is down from 8.9 previously No regimen changes, blood sugars are improving Uncontrolled neuropathy-continue gabapentin We'll consider increasing dose of gabapentin at the next visit. - POCT glucose (manual entry) - Insulin Glargine (LANTUS SOLOSTAR) 100 UNIT/ML Solostar Pen; Inject 30 Units into the skin at bedtime.  Dispense: 5 pen; Refill: 5  2. Peripheral Artery Disease / Wound infection Status post bilateral femoral endarterectomy and femoral to below knee popliteal artery bypass with graft Wound sites are healing Dressing change performed in the clinic Scheduled for LE bypass next week. - CBC with Differential/Platelet  3. Pure hypercholesterolemia Low cholesterol diet Continue statin  4. Essential hypertension Blood pressure is on the low side Reduced dose of lisinopril from 10 mg to 5 mg at last visit - Patient to ensure she is taking 5 mg and not 10mg  - lisinopril (PRINIVIL,ZESTRIL) 5 MG tablet; Take 1 tablet (5 mg total) by mouth daily.  Dispense: 30 tablet; Refill: 5  5. Coronary artery disease involving coronary bypass graft of native heart with unstable angina pectoris (HCC) Risk factor modification Continue statin  6. Gastroesophageal reflux disease without esophagitis Stable -  pantoprazole (PROTONIX) 20 MG tablet; Take 1 tablet (20 mg total) by mouth daily.  Dispense: 30 tablet; Refill: 1  7. Adjustment reaction with anxiety and depression Due to multiple underlying medical conditions Continue Celexa - citalopram (CELEXA) 20 MG tablet; Take 1 tablet (20 mg total) by mouth daily.  Dispense: 30 tablet; Refill: 3  8. Diabetic foot ulcer Gangrene of left big toe Referred to orthopedics  This note has been created with Education officer, environmental. Any transcriptional errors are unintentional.

## 2016-07-31 NOTE — Telephone Encounter (Signed)
Received call from OmerMarsha, OT from [facility] stating that she had not been able to get in touch with the patient this week.  I called [name] and checked to see if there was a problem as her OT was to begin.  She states that she had been to a Dr's appointment today and had her dressing change but home since.  I called Mindi JunkerMarsha back to inform her that the patient was available and wanted to discuss the plan of treatment. Mindi JunkerMarsha states that she will be seeing the patient Monday, July 23,2018.

## 2016-08-04 ENCOUNTER — Encounter: Payer: Self-pay | Admitting: Vascular Surgery

## 2016-08-14 ENCOUNTER — Telehealth: Payer: Self-pay | Admitting: Family Medicine

## 2016-08-14 ENCOUNTER — Encounter: Payer: Self-pay | Admitting: Family Medicine

## 2016-08-14 ENCOUNTER — Ambulatory Visit: Payer: BLUE CROSS/BLUE SHIELD | Attending: Family Medicine | Admitting: Family Medicine

## 2016-08-14 VITALS — BP 104/69 | HR 73 | Temp 98.8°F | Resp 20 | Wt 132.6 lb

## 2016-08-14 DIAGNOSIS — M797 Fibromyalgia: Secondary | ICD-10-CM | POA: Insufficient documentation

## 2016-08-14 DIAGNOSIS — G3281 Cerebellar ataxia in diseases classified elsewhere: Secondary | ICD-10-CM | POA: Diagnosis not present

## 2016-08-14 DIAGNOSIS — Z886 Allergy status to analgesic agent status: Secondary | ICD-10-CM | POA: Insufficient documentation

## 2016-08-14 DIAGNOSIS — I739 Peripheral vascular disease, unspecified: Secondary | ICD-10-CM

## 2016-08-14 DIAGNOSIS — Z955 Presence of coronary angioplasty implant and graft: Secondary | ICD-10-CM | POA: Insufficient documentation

## 2016-08-14 DIAGNOSIS — Z7982 Long term (current) use of aspirin: Secondary | ICD-10-CM | POA: Insufficient documentation

## 2016-08-14 DIAGNOSIS — I96 Gangrene, not elsewhere classified: Secondary | ICD-10-CM | POA: Diagnosis not present

## 2016-08-14 DIAGNOSIS — E11621 Type 2 diabetes mellitus with foot ulcer: Secondary | ICD-10-CM | POA: Diagnosis not present

## 2016-08-14 DIAGNOSIS — E1149 Type 2 diabetes mellitus with other diabetic neurological complication: Secondary | ICD-10-CM

## 2016-08-14 DIAGNOSIS — I2510 Atherosclerotic heart disease of native coronary artery without angina pectoris: Secondary | ICD-10-CM | POA: Diagnosis not present

## 2016-08-14 DIAGNOSIS — L97528 Non-pressure chronic ulcer of other part of left foot with other specified severity: Secondary | ICD-10-CM | POA: Insufficient documentation

## 2016-08-14 DIAGNOSIS — E1151 Type 2 diabetes mellitus with diabetic peripheral angiopathy without gangrene: Secondary | ICD-10-CM | POA: Diagnosis present

## 2016-08-14 DIAGNOSIS — F4323 Adjustment disorder with mixed anxiety and depressed mood: Secondary | ICD-10-CM | POA: Diagnosis not present

## 2016-08-14 DIAGNOSIS — I10 Essential (primary) hypertension: Secondary | ICD-10-CM | POA: Insufficient documentation

## 2016-08-14 DIAGNOSIS — E7800 Pure hypercholesterolemia, unspecified: Secondary | ICD-10-CM | POA: Insufficient documentation

## 2016-08-14 DIAGNOSIS — J45909 Unspecified asthma, uncomplicated: Secondary | ICD-10-CM | POA: Diagnosis not present

## 2016-08-14 DIAGNOSIS — Z9889 Other specified postprocedural states: Secondary | ICD-10-CM | POA: Diagnosis not present

## 2016-08-14 DIAGNOSIS — E1152 Type 2 diabetes mellitus with diabetic peripheral angiopathy with gangrene: Secondary | ICD-10-CM | POA: Insufficient documentation

## 2016-08-14 DIAGNOSIS — I25110 Atherosclerotic heart disease of native coronary artery with unstable angina pectoris: Secondary | ICD-10-CM | POA: Insufficient documentation

## 2016-08-14 DIAGNOSIS — Z885 Allergy status to narcotic agent status: Secondary | ICD-10-CM | POA: Insufficient documentation

## 2016-08-14 DIAGNOSIS — K219 Gastro-esophageal reflux disease without esophagitis: Secondary | ICD-10-CM | POA: Diagnosis not present

## 2016-08-14 DIAGNOSIS — Z7902 Long term (current) use of antithrombotics/antiplatelets: Secondary | ICD-10-CM | POA: Insufficient documentation

## 2016-08-14 DIAGNOSIS — E1142 Type 2 diabetes mellitus with diabetic polyneuropathy: Secondary | ICD-10-CM | POA: Insufficient documentation

## 2016-08-14 DIAGNOSIS — Z951 Presence of aortocoronary bypass graft: Secondary | ICD-10-CM | POA: Insufficient documentation

## 2016-08-14 DIAGNOSIS — E119 Type 2 diabetes mellitus without complications: Secondary | ICD-10-CM | POA: Diagnosis not present

## 2016-08-14 DIAGNOSIS — R42 Dizziness and giddiness: Secondary | ICD-10-CM | POA: Diagnosis not present

## 2016-08-14 DIAGNOSIS — Z794 Long term (current) use of insulin: Secondary | ICD-10-CM | POA: Diagnosis not present

## 2016-08-14 MED ORDER — PREGABALIN 100 MG PO CAPS: 100.0000 mg | ORAL_CAPSULE | Freq: Two times a day (BID) | ORAL | 3 refills | Status: DC

## 2016-08-14 NOTE — Progress Notes (Signed)
Pt has not eaten this morning  Pt has taken medication this morning

## 2016-08-14 NOTE — Progress Notes (Signed)
Subjective:    Patient ID: Andrea [name], female    DOB: 29-Jul-1950, 66 y.o.   MRN: [id number]  HPI She is a 66 year old female with a history of uncontrolled type 2 diabetes mellitus (A1c 8.5 down from 8.9 previously), hypertension,Coronary artery disease status post CABG in 2006, status post stent in 2000  peripheral vascular disease status post bilateral  common femoral endarterectomy and left femoral to Low knee popliteal artery bypass.  She has home health care with her last visit from the nurse yesterday. She performed dressing change of her groin wounds this morning herself.  She has shooting pains in her left leg all the way to her groin and numbness, pins and needles which have not been controlled on her current dose of gabapentin 600 mg 3 times daily.  She complains of feeling off balance, high-stepping gait when she gets up from a sitting position. Denies vertigo or dizziness on turning her head from left-to-right. This has happened once every week for the last 1 month. Denies headaches, blurry vision, nausea or vomiting.   Past Medical History:  Diagnosis Date  . Asthma   . Coronary artery disease   . Diabetes mellitus without complication (HCC)   . Dyspnea   . Fibromyalgia   . GERD (gastroesophageal reflux disease)   . High cholesterol   . Hypertension   . PONV (postoperative nausea and vomiting)   . PVD (peripheral vascular disease) (HCC)   . Urinary frequency   . Urinary incontinence     Past Surgical History:  Procedure Laterality Date  . ABDOMINAL AORTOGRAM W/LOWER EXTREMITY N/A 03/25/2016   Procedure: Abdominal Aortogram w/Lower Extremity;  Surgeon: Maeola Harman, MD;  Location: [facility] INVASIVE CV LAB;  Service: Cardiovascular;  Laterality: N/A;  . APPLICATION OF WOUND VAC Right 06/14/2016   Procedure: APPLICATION OF WOUND VAC;  Surgeon: Sherren Kerns, MD;  Location: [facility] OR;  Service: Vascular;  Laterality: Right;  . CORONARY ARTERY BYPASS GRAFT      . CORONARY STENT INTERVENTION N/A 04/15/2016   Procedure: Coronary Stent Intervention;  Surgeon: Lennette Bihari, MD;  Location: [facility] INVASIVE CV LAB;  Service: Cardiovascular;  Laterality: N/A;  . ENDARTERECTOMY FEMORAL Right 05/21/2016   Procedure: RIGHT FEMORAL ENDARTERECTOMY;  Surgeon: Maeola Harman, MD;  Location[facility] OR;  Service: Vascular;  Laterality: Right;  . ENDARTERECTOMY FEMORAL Left 07/20/2016   Procedure: LEFT FEMORAL ENDARTERECTOMY;  Surgeon: Maeola Harman, MD;  Location: [facility] OR;  Service: Vascular;  Laterality: Left;  . FEMORAL BYPASS Left 07/20/2016   Left common femoral endarterectomy  . FEMORAL-POPLITEAL BYPASS GRAFT Right 05/21/2016   Procedure: RIGHT FEMORAL-BELOW KNEE POPLITEAL ARTERY BYPASS GRAFT USING PROPATEN GORETEX GRAFT;  Surgeon: Maeola Harman, MD;  Location: [facility] OR;  Service: Vascular;  Laterality: Right;  . FEMORAL-POPLITEAL BYPASS GRAFT Left 07/20/2016   Procedure: LEFT FEMORAL TO BELOW KNEE POPLITEAL ARTERY  BYPASS GRAFT;  Surgeon: Maeola Harman, MD;  Location: [facility] OR;  Service: Vascular;  Laterality: Left;  . GROIN DEBRIDEMENT Right 06/14/2016   Procedure: GROIN DEBRIDEMENT;  Surgeon: Sherren Kerns, MD;  Location: [facility] OR;  Service: Vascular;  Laterality: Right;  . KNEE SURGERY    . LEFT HEART CATH AND CORS/GRAFTS ANGIOGRAPHY N/A 04/15/2016   Procedure: Left Heart Cath and Cors/Grafts Angiography;  Surgeon: Lennette Bihari, MD;  Location: [facility] INVASIVE CV LAB;  Service: Cardiovascular;  Laterality: N/A;  . MULTIPLE TOOTH EXTRACTIONS      Allergies  Allergen Reactions  .  Naproxen Itching  . Tramadol Hcl Itching     Current Outpatient Prescriptions on File Prior to Visit  Medication Sig Dispense Refill  . acetaminophen-codeine (TYLENOL #3) 300-30 MG tablet Take 1 tablet by mouth every 8 (eight) hours as needed for moderate pain. 60 tablet 0  . albuterol (PROVENTIL HFA;VENTOLIN HFA) 108 (90 Base) MCG/ACT inhaler Inhale 2  puffs into the lungs every 6 (six) hours as needed for wheezing or shortness of breath. 54 Inhaler 3  . aspirin EC 81 MG tablet Take 1 tablet (81 mg total) by mouth daily. 90 tablet 3  . atorvastatin (LIPITOR) 80 MG tablet Take 1 tablet (80 mg total) by mouth daily. (Patient taking differently: Take 80 mg by mouth at bedtime. ) 90 tablet 3  . carbamide peroxide (DEBROX) 6.5 % otic solution Place 5 drops into both ears daily as needed (wax buildup).    . citalopram (CELEXA) 20 MG tablet Take 1 tablet (20 mg total) by mouth daily. 30 tablet 3  . clopidogrel (PLAVIX) 75 MG tablet Take 1 tablet (75 mg total) by mouth daily with breakfast. 90 tablet 3  . cyclobenzaprine (FLEXERIL) 5 MG tablet Take 5 mg by mouth every 6 (six) hours as needed (pain/muscle spams.).   0  . Fluticasone-Salmeterol (ADVAIR) 100-50 MCG/DOSE AEPB Inhale 1 puff into the lungs 2 (two) times daily. 180 each 3  . furosemide (LASIX) 20 MG tablet Take 1 tablet (20 mg total) by mouth daily. 30 tablet 1  . hydrochlorothiazide (HYDRODIURIL) 25 MG tablet Take 25 mg by mouth daily.  0  . HYDROcodone-acetaminophen (NORCO/VICODIN) 5-325 MG tablet Take 1 tablet by mouth every 6 (six) hours as needed for moderate pain. 30 tablet 0  . ibuprofen (ADVIL,MOTRIN) 200 MG tablet Take 400 mg by mouth every 6 (six) hours as needed.    . Insulin Glargine (LANTUS SOLOSTAR) 100 UNIT/ML Solostar Pen Inject 30 Units into the skin at bedtime. 5 pen 5  . lisinopril (PRINIVIL,ZESTRIL) 5 MG tablet Take 1 tablet (5 mg total) by mouth daily. 30 tablet 5  . metFORMIN (GLUCOPHAGE) 1000 MG tablet Take 1 tablet (1,000 mg total) by mouth 2 (two) times daily with a meal. Restart 48 hrs after discharge. 60 tablet 3  . metoprolol tartrate (LOPRESSOR) 25 MG tablet Take 0.5 tablets (12.5 mg total) by mouth 2 (two) times daily. 90 tablet 3  . oxybutynin (DITROPAN) 5 MG tablet TAKE 1 TABLET BY MOUTH 2 TIMES DAILY. 60 tablet 2  . pantoprazole (PROTONIX) 20 MG tablet Take 1  tablet (20 mg total) by mouth daily. 30 tablet 1  . potassium chloride (K-DUR) 10 MEQ tablet Take 1 tablet (10 mEq total) by mouth daily. 30 tablet 1  . promethazine (PHENERGAN) 25 MG tablet Take 12.5-25 mg by mouth every 6 (six) hours as needed for nausea or vomiting.     No current facility-administered medications on file prior to visit.      Review of Systems Constitutional: Negative for activity change, appetite change and positive for fatigue.  HENT: Negative for congestion, sinus pressure and sore throat.   Eyes: Negative for visual disturbance.  Respiratory: Negative for cough, chest tightness, shortness of breath and wheezing.   Cardiovascular: Negative for chest pain and palpitations.  Gastrointestinal: Negative for abdominal distention, abdominal pain and constipation.  Endocrine: Negative for polydipsia.  Genitourinary: Negative for dysuria and frequency.  Musculoskeletal: Negative for arthralgias and back pain.  Skin: Positive for wound. Negative for rash.  Neurological: Positive for numbness.  Negative for tremors and positive for light-headedness.  Hematological: Does not bruise/bleed easily.  Psychiatric/Behavioral: Negative for agitation, behavioral problems and suicidal ideas.      Objective:  Vitals:   08/14/16 1025  BP: 104/69  Pulse: 73  Resp: 20  Temp: 98.8 F (37.1 C)  TempSrc: Oral  SpO2: 99%  Weight: 132 lb 9.6 oz (60.1 kg)      Physical Exam Constitutional: She is oriented to person, place, and time. She appears well-developed and well-nourished.  HEENT: Slight maxillary sinus tenderness on the left, right TM occluded by cerumen, left TM normal, normal oropharynx Cardiovascular: Normal rate and normal heart sounds.   No murmur heard. Unable to palpate dorsalis pedis  or posterior tibial Pulmonary/Chest: Effort normal and breath sounds normal. She has no wheezes. She has no rales. She exhibits no tenderness.  Abdominal: Soft. Bowel sounds are  normal. She exhibits no distension and no mass. There is no tenderness.  Musculoskeletal: Normal range of motion. She exhibits 1+ bilateral pitting edema.  dry gangrene of left big toe with slight serous drainage on the medial aspect  Neurological: She is alert and oriented to person, place, and time.  Skin: Ulcer in both groin with granulation tissue; lateral aspect of left leg with old diabetic ulcer, superficial scab Psych: Normal        Assessment & Plan:  1. DM type 2 with diabetic peripheral neuropathy (HCC) A1c of 8.5 which is down from 8.9 previously No regimen changes, blood sugars are improving Uncontrolled neuropathy -Uncontrolled on gabapentin Switch to Lyrica - POCT glucose (manual entry) - Insulin Glargine (LANTUS SOLOSTAR) 100 UNIT/ML Solostar Pen; Inject 30 Units into the skin at bedtime.  Dispense: 5 pen; Refill: 5  2. Peripheral Artery Disease / Wound infection Status post bilateral femoral endarterectomy and femoral to below knee popliteal artery bypass with graft Wound sites are healing Dressing change performed . The patient this morning - CBC with Differential/Platelet  3. Pure hypercholesterolemia Low cholesterol diet Continue statin  4. Essential hypertension Blood pressure is on the low side Asymptomatic - lisinopril (PRINIVIL,ZESTRIL) 5 MG tablet; Take 1 tablet (5 mg total) by mouth daily.  Dispense: 30 tablet; Refill: 5  5. Coronary artery disease involving coronary bypass graft of native heart with unstable angina pectoris (HCC) Risk factor modification Continue statin  6. Gastroesophageal reflux disease without esophagitis Stable - pantoprazole (PROTONIX) 20 MG tablet; Take 1 tablet (20 mg total) by mouth daily.  Dispense: 30 tablet; Refill: 1  7. Adjustment reaction with anxiety and depression Due to multiple underlying medical conditions Continue Celexa - citalopram (CELEXA) 20 MG tablet; Take 1 tablet (20 mg total) by mouth daily.   Dispense: 30 tablet; Refill: 3  8. Diabetic foot ulcer Gangrene of left big toe Referred to orthopedics  9.Dizziness Head CT ordered

## 2016-08-14 NOTE — Telephone Encounter (Signed)
[employer] rep called needing CT [facility] Fax #[phone]. Pt has appt scheduled

## 2016-08-17 ENCOUNTER — Encounter: Payer: Self-pay | Admitting: Pharmacist

## 2016-08-17 NOTE — Telephone Encounter (Signed)
Pt called to check on the statu of the order she request for the CT scan, she has an appt @ 11:30am today, she ask to please call her when the order has been [place]. Please follow up

## 2016-08-17 NOTE — Progress Notes (Signed)
Completed prior authorization for Lyrica. [facility] and pending approval.

## 2016-08-19 ENCOUNTER — Ambulatory Visit (HOSPITAL_COMMUNITY): Payer: BLUE CROSS/BLUE SHIELD

## 2016-08-20 NOTE — Telephone Encounter (Signed)
Will route to nurse °

## 2016-08-21 ENCOUNTER — Encounter: Payer: Self-pay | Admitting: Vascular Surgery

## 2016-08-21 ENCOUNTER — Ambulatory Visit (INDEPENDENT_AMBULATORY_CARE_PROVIDER_SITE_OTHER): Payer: Self-pay | Admitting: Vascular Surgery

## 2016-08-21 VITALS — BP 97/43 | HR 67 | Temp 98.9°F | Resp 16 | Ht 62.0 in | Wt 131.6 lb

## 2016-08-21 DIAGNOSIS — I739 Peripheral vascular disease, unspecified: Secondary | ICD-10-CM

## 2016-08-21 DIAGNOSIS — I6522 Occlusion and stenosis of left carotid artery: Secondary | ICD-10-CM

## 2016-08-21 DIAGNOSIS — Z95828 Presence of other vascular implants and grafts: Secondary | ICD-10-CM

## 2016-08-21 MED FILL — LANTUS SOLOSTAR 100 UNITS/M: 100 | 30 days supply | Qty: 9 | Fill #0

## 2016-08-21 NOTE — Progress Notes (Signed)
Subjective:     Patient ID: Andrea [name], female   DOB: 04-10-1950, 66 y.o.   MRN: [id number]  HPI 66 year old female follows up from her left lower extremity bypass that was done for multiple leg ulcerations as well as left great toe gangrene. She still has gangrene of the left great toe that is dry and she is applying Betadine daily without fevers chills or any erythema. She is taking Plavix as prescribed. Her feet are both healing well and she is walking with a cane. She is placing a dry dressing on her left groin her right groin is nearly healed at this point. She has no complaints related to today's visit.   Review of Systems No new issues, left great toe gangrene    Objective:   Physical Exam [id number] Nearly healed right groin incision Left groin incision cdi with minimal drainage noted Both below knee incisions are well healed Strong right pt signal Strong multiphasic left dp Left great toe gangren is stable, no drainage    Assessment/plan     66 year old female here for postoperative visit left femoral-popliteal artery bypass and has stable left great toe gangrene and her other toes are now looking much better on that side. 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 We will have her follow-up in 4-6 weeks with bilateral lower extremity bypass duplexes with ABIs and also carotid artery duplexes. Should she decide to have the toe amputated we can schedule that without reevaluation. At this time she has had a lot of medical problems and is happy just to be at home and is getting around just fine with Betadine painting of the toe.  [name] C. Randie Heinzain, MD [facility] Office: [phone] Pager: [phone]

## 2016-08-25 ENCOUNTER — Other Ambulatory Visit: Payer: Self-pay | Admitting: Family Medicine

## 2016-08-25 DIAGNOSIS — N3281 Overactive bladder: Secondary | ICD-10-CM

## 2016-08-25 MED FILL — GABAPENTIN 300 MG CAPSULE: 300 | 10 days supply | Qty: 60 | Fill #1

## 2016-08-25 MED FILL — LISINOPRIL 5 MG TAB: 5 | 30 days supply | Qty: 30 | Fill #1

## 2016-08-25 MED FILL — FUROSEMIDE 20 MG TABLET: 20 | 30 days supply | Qty: 30 | Fill #1

## 2016-08-25 MED FILL — POTASSIUM CL 10 MEQ TAB SA: 10 | 30 days supply | Qty: 30 | Fill #1

## 2016-08-25 MED FILL — HYDROCHLOROTHIAZIDE 25 MG T: 25 | 30 days supply | Qty: 30 | Fill #5

## 2016-08-25 MED FILL — ATORVASTATIN 80 MG TABLET: 80 | 30 days supply | Qty: 30 | Fill #7

## 2016-08-25 MED FILL — OXYBUTYNIN 5 MG TABLET: 5 | 30 days supply | Qty: 60 | Fill #2

## 2016-08-25 MED FILL — CLOPIDOGREL 75 MG TABLET: 75 | 90 days supply | Qty: 90 | Fill #1

## 2016-08-25 MED FILL — metFORMIN HCL 1000 MG TABS: 1000 | 30 days supply | Qty: 60 | Fill #0

## 2016-08-25 NOTE — Telephone Encounter (Signed)
Will route to PCP 

## 2016-08-25 NOTE — Telephone Encounter (Signed)
Could you please check with her to see what prescription she lost? If she has burning on urination she would need to be evaluated for UTI.

## 2016-08-25 NOTE — Telephone Encounter (Signed)
Pt was called and informed a OV is needed for UTI.pt has appointment on 8/17.

## 2016-08-25 NOTE — Telephone Encounter (Signed)
Pt. Called requesting to speak with her PCP regarding her UTI.  Pt. States she cannot fin her Rx and it burns when she urinates. Please f/u with pt.

## 2016-08-28 ENCOUNTER — Ambulatory Visit: Payer: BLUE CROSS/BLUE SHIELD | Attending: Family Medicine | Admitting: Family Medicine

## 2016-08-28 ENCOUNTER — Ambulatory Visit: Payer: BLUE CROSS/BLUE SHIELD | Attending: Family Medicine | Admitting: Licensed Clinical Social Worker

## 2016-08-28 ENCOUNTER — Encounter: Payer: Self-pay | Admitting: Family Medicine

## 2016-08-28 ENCOUNTER — Encounter: Payer: Self-pay | Admitting: Pharmacist

## 2016-08-28 VITALS — BP 118/72 | HR 68 | Temp 98.2°F | Ht 62.0 in | Wt 135.8 lb

## 2016-08-28 DIAGNOSIS — I739 Peripheral vascular disease, unspecified: Secondary | ICD-10-CM | POA: Diagnosis not present

## 2016-08-28 DIAGNOSIS — M797 Fibromyalgia: Secondary | ICD-10-CM | POA: Insufficient documentation

## 2016-08-28 DIAGNOSIS — Z951 Presence of aortocoronary bypass graft: Secondary | ICD-10-CM | POA: Diagnosis not present

## 2016-08-28 DIAGNOSIS — L97518 Non-pressure chronic ulcer of other part of right foot with other specified severity: Secondary | ICD-10-CM

## 2016-08-28 DIAGNOSIS — F4323 Adjustment disorder with mixed anxiety and depressed mood: Secondary | ICD-10-CM

## 2016-08-28 DIAGNOSIS — E7800 Pure hypercholesterolemia, unspecified: Secondary | ICD-10-CM | POA: Diagnosis not present

## 2016-08-28 DIAGNOSIS — Z886 Allergy status to analgesic agent status: Secondary | ICD-10-CM | POA: Insufficient documentation

## 2016-08-28 DIAGNOSIS — E1151 Type 2 diabetes mellitus with diabetic peripheral angiopathy without gangrene: Secondary | ICD-10-CM | POA: Insufficient documentation

## 2016-08-28 DIAGNOSIS — K219 Gastro-esophageal reflux disease without esophagitis: Secondary | ICD-10-CM | POA: Insufficient documentation

## 2016-08-28 DIAGNOSIS — E119 Type 2 diabetes mellitus without complications: Secondary | ICD-10-CM

## 2016-08-28 DIAGNOSIS — N3000 Acute cystitis without hematuria: Secondary | ICD-10-CM

## 2016-08-28 DIAGNOSIS — I25110 Atherosclerotic heart disease of native coronary artery with unstable angina pectoris: Secondary | ICD-10-CM | POA: Insufficient documentation

## 2016-08-28 DIAGNOSIS — I10 Essential (primary) hypertension: Secondary | ICD-10-CM

## 2016-08-28 DIAGNOSIS — Z955 Presence of coronary angioplasty implant and graft: Secondary | ICD-10-CM | POA: Insufficient documentation

## 2016-08-28 DIAGNOSIS — J45909 Unspecified asthma, uncomplicated: Secondary | ICD-10-CM | POA: Insufficient documentation

## 2016-08-28 DIAGNOSIS — Z885 Allergy status to narcotic agent status: Secondary | ICD-10-CM | POA: Diagnosis not present

## 2016-08-28 DIAGNOSIS — N390 Urinary tract infection, site not specified: Secondary | ICD-10-CM | POA: Insufficient documentation

## 2016-08-28 DIAGNOSIS — E11621 Type 2 diabetes mellitus with foot ulcer: Secondary | ICD-10-CM

## 2016-08-28 DIAGNOSIS — Z794 Long term (current) use of insulin: Secondary | ICD-10-CM | POA: Insufficient documentation

## 2016-08-28 DIAGNOSIS — Z9889 Other specified postprocedural states: Secondary | ICD-10-CM | POA: Insufficient documentation

## 2016-08-28 DIAGNOSIS — R42 Dizziness and giddiness: Secondary | ICD-10-CM | POA: Insufficient documentation

## 2016-08-28 DIAGNOSIS — E1165 Type 2 diabetes mellitus with hyperglycemia: Secondary | ICD-10-CM | POA: Diagnosis not present

## 2016-08-28 DIAGNOSIS — Z7982 Long term (current) use of aspirin: Secondary | ICD-10-CM | POA: Insufficient documentation

## 2016-08-28 LAB — POCT URINALYSIS DIPSTICK
Bilirubin, UA: NEGATIVE
Glucose, UA: NEGATIVE
Nitrite, UA: POSITIVE
Protein, UA: 30
Spec Grav, UA: 1.02 (ref 1.010–1.025)
Urobilinogen, UA: 0.2 U/dL
pH, UA: 5.5 (ref 5.0–8.0)

## 2016-08-28 LAB — GLUCOSE, POCT (MANUAL RESULT ENTRY): POC Glucose: 186 mg/dl — AB (ref 70–99)

## 2016-08-28 LAB — POCT GLYCOSYLATED HEMOGLOBIN (HGB A1C): Hemoglobin A1C: 6.8

## 2016-08-28 MED ORDER — SULFAMETHOXAZOLE-TRIMETHOPRIM 800-160 MG PO TABS: 1.0000 | ORAL_TABLET | Freq: Two times a day (BID) | ORAL | 0 refills | Status: DC

## 2016-08-28 MED ORDER — DULOXETINE HCL 60 MG PO CPEP: 60.0000 mg | ORAL_CAPSULE | Freq: Every day | ORAL | 1 refills | Status: DC

## 2016-08-28 MED FILL — SULFAMETHOXAZOLE-TMP DS TAB: 800-160 | 10 days supply | Qty: 20 | Fill #0

## 2016-08-28 NOTE — Progress Notes (Addendum)
I will place on Cymbalta then. Erskine Squibb, could you please advise the patient to discontinue Celexa and try Cymbalta which would help with depression and pain. If this is ineffective for pain we will then proceed with prior authorization for Lyrica. Please reference Stacey's note below. Thanks.

## 2016-08-28 NOTE — BH Specialist Note (Signed)
Integrated Behavioral Health Follow Up Visit  MRN: [id number] Name: Andrea [name]   Session Start time: 2:00 PM Session End time: 2:35 PM Total time: 35 minutes Number of Integrated Behavioral Health Clinician visits: 2/10  Type of Service: Integrated Behavioral Health- Individual/Family Interpretor:No. Interpretor Name and Language: N/A   Warm Hand Off Completed.       SUBJECTIVE: Andrea [name] is a 66 y.o. female accompanied by patient. Patient was referred by Dr. Venetia Night for depression. Patient reports the following symptoms/concerns: overwhelming feelings of sadness and worry, difficulty sleeping, low energy, racing thoughts, inability to concentrate, and withdrawn behavior Duration of problem: Ongoing Pt reports being diagnosed with Depression and Anxiety ten years ago; Severity of problem: severe  OBJECTIVE: Mood: Depressed and Affect: Tearful Risk of harm to self or others: No plan to harm self or others   LIFE CONTEXT: Family and Social: Pt resides alone and receives support from sister who resides nearby. She has a strained relationship with her adult children.  School/Work: Pt receives Set designer. She has applied for disability and is pending a hearing date. Self-Care: Pt manages health by complying with medication management for her diabetes. She has adopted a healthy diet and tries to walk when she is not experiencing pain Life Changes: Pt has ongoing medical concerns, states "I'm tired of being sick". She is grieving the loss of spouse and step-daughter.  GOALS ADDRESSED: Patient will reduce symptoms of: depression and increase knowledge and/or ability of: coping skills and also: Increase healthy adjustment to current life circumstances and Increase adequate support systems for patient/family  INTERVENTIONS: Supportive Counseling and Link to [employer] Standardized Assessments completed: GAD-7 and PHQ 2&9  ASSESSMENT: Pt  currently experiencing depression triggered by ongoing medical concerns. She reports overwhelming feelings of sadness and worry, difficulty sleeping, low energy, racing thoughts, inability to concentrate, and withdrawn behavior. She has limited support due to ongoing conflict with adult children. Pt may benefit from psychotherapy and medication management. LCSWA provided education on the correlation between physical and mental health. Pt is participating in medication management through PCP and has implemented a healthy diet to assist in managing diabetes. LCSWA provided information on food insecurity, utility assistance, and transportation services. Pt was provided with additional resources for crisis intervention and therapy.    PLAN: 1. Follow up with behavioral health clinician on : Pt was encouraged tocontact LCSWA if symptoms worsen or fail to improveto schedule behavioral appointments at [facility]. 2. Behavioral recommendations: LCSWA recommends that pt apply healthy coping skills discussed and utilize resources as needed. Pt is encouraged to schedule follow up appointment with LCSWA 3. Referral(s): Paramedic (LME/Outside Clinic) and [employer]:  Engineer, water 4. "From scale of 1-10, how likely are you to follow plan?": 8/10  Bridgett Larsson, LCSW 09/01/16 3:54 PM

## 2016-08-28 NOTE — Addendum Note (Signed)
Addended by: Jaclyn Shaggy on: 08/28/2016 04:30 PM   Modules accepted: Orders

## 2016-08-28 NOTE — Progress Notes (Signed)
Subjective:    Patient ID: Andrea [name], female    DOB: August 11, 1950, 66 y.o.   MRN: [id number]  HPI She is a 66 year old female with a history of uncontrolled type 2 diabetes mellitus (A1c 8.5 down from 8.9 previously), hypertension,Coronary artery disease status post CABG in 2006, status post stent in 2000  peripheral vascular disease status post bilateral  common femoral endarterectomy and left femoral to Low knee popliteal artery bypass.  I have discontinued gabapentin and placed on Lyrica at her last visit but she has been unable to obtain the Lyrica. Speaking with the pharmacist reveal Lyrica was She has shooting pains in her left leg all the way to her groin and numbness, pins and needles.  She is depressed today and breaks out in tears stating she is "sick of being sick". She denies suicidal ideation or intent and remains on Celexa. She was seen in conjunction with the LCSW.  Dizziness she had complained of at her last visit and high-stepping gait have resolved. Head CT which was ordered came back with no acute abnormalities. Denies headaches, blurry vision, nausea or vomiting.  Past Medical History:  Diagnosis Date  . Asthma   . Coronary artery disease   . Diabetes mellitus without complication (HCC)   . Dyspnea   . Fibromyalgia   . GERD (gastroesophageal reflux disease)   . High cholesterol   . Hypertension   . PONV (postoperative nausea and vomiting)   . PVD (peripheral vascular disease) (HCC)   . Urinary frequency   . Urinary incontinence     Past Surgical History:  Procedure Laterality Date  . ABDOMINAL AORTOGRAM W/LOWER EXTREMITY N/A 03/25/2016   Procedure: Abdominal Aortogram w/Lower Extremity;  Surgeon: Maeola Harman, MD;  Location: [facility] INVASIVE CV LAB;  Service: Cardiovascular;  Laterality: N/A;  . APPLICATION OF WOUND VAC Right 06/14/2016   Procedure: APPLICATION OF WOUND VAC;  Surgeon: Sherren Kerns, MD;  Location: [facility] OR;  Service: Vascular;   Laterality: Right;  . CORONARY ARTERY BYPASS GRAFT    . CORONARY STENT INTERVENTION N/A 04/15/2016   Procedure: Coronary Stent Intervention;  Surgeon: Lennette Bihari, MD;  Location: [facility] INVASIVE CV LAB;  Service: Cardiovascular;  Laterality: N/A;  . ENDARTERECTOMY FEMORAL Right 05/21/2016   Procedure: RIGHT FEMORAL ENDARTERECTOMY;  Surgeon: Maeola Harman, MD;  Location: [facility] OR;  Service: Vascular;  Laterality: Right;  . ENDARTERECTOMY FEMORAL Left 07/20/2016   Procedure: LEFT FEMORAL ENDARTERECTOMY;  Surgeon: Maeola Harman, MD;  Location: [facility] OR;  Service: Vascular;  Laterality: Left;  . FEMORAL BYPASS Left 07/20/2016   Left common femoral endarterectomy  . FEMORAL-POPLITEAL BYPASS GRAFT Right 05/21/2016   Procedure: RIGHT FEMORAL-BELOW KNEE POPLITEAL ARTERY BYPASS GRAFT USING PROPATEN GORETEX GRAFT;  Surgeon: Maeola Harman, MD;  Location: [facility] OR;  Service: Vascular;  Laterality: Right;  . FEMORAL-POPLITEAL BYPASS GRAFT Left 07/20/2016   Procedure: LEFT FEMORAL TO BELOW KNEE POPLITEAL ARTERY  BYPASS GRAFT;  Surgeon: Maeola Harman, MD;  Location: [facility] OR;  Service: Vascular;  Laterality: Left;  . GROIN DEBRIDEMENT Right 06/14/2016   Procedure: GROIN DEBRIDEMENT;  Surgeon: Sherren Kerns, MD;  Location: [facility] OR;  Service: Vascular;  Laterality: Right;  . KNEE SURGERY    . LEFT HEART CATH AND CORS/GRAFTS ANGIOGRAPHY N/A 04/15/2016   Procedure: Left Heart Cath and Cors/Grafts Angiography;  Surgeon: Lennette Bihari, MD;  Location: [facility] INVASIVE CV LAB;  Service: Cardiovascular;  Laterality: N/A;  . MULTIPLE TOOTH EXTRACTIONS  Allergies  Allergen Reactions  . Naproxen Itching  . Tramadol Hcl Itching    Current Outpatient Prescriptions on File Prior to Visit  Medication Sig Dispense Refill  . acetaminophen-codeine (TYLENOL #3) 300-30 MG tablet Take 1 tablet by mouth every 8 (eight) hours as needed for moderate pain. 60 tablet 0  . albuterol (PROVENTIL  HFA;VENTOLIN HFA) 108 (90 Base) MCG/ACT inhaler Inhale 2 puffs into the lungs every 6 (six) hours as needed for wheezing or shortness of breath. 54 Inhaler 3  . aspirin EC 81 MG tablet Take 1 tablet (81 mg total) by mouth daily. 90 tablet 3  . atorvastatin (LIPITOR) 80 MG tablet Take 1 tablet (80 mg total) by mouth daily. (Patient taking differently: Take 80 mg by mouth at bedtime. ) 90 tablet 3  . carbamide peroxide (DEBROX) 6.5 % otic solution Place 5 drops into both ears daily as needed (wax buildup).    . clopidogrel (PLAVIX) 75 MG tablet Take 1 tablet (75 mg total) by mouth daily with breakfast. 90 tablet 3  . cyclobenzaprine (FLEXERIL) 5 MG tablet Take 5 mg by mouth every 6 (six) hours as needed (pain/muscle spams.).   0  . Fluticasone-Salmeterol (ADVAIR) 100-50 MCG/DOSE AEPB Inhale 1 puff into the lungs 2 (two) times daily. 180 each 3  . furosemide (LASIX) 20 MG tablet Take 1 tablet (20 mg total) by mouth daily. 30 tablet 1  . hydrochlorothiazide (HYDRODIURIL) 25 MG tablet Take 25 mg by mouth daily.  0  . HYDROcodone-acetaminophen (NORCO/VICODIN) 5-325 MG tablet Take 1 tablet by mouth every 6 (six) hours as needed for moderate pain. 30 tablet 0  . ibuprofen (ADVIL,MOTRIN) 200 MG tablet Take 400 mg by mouth every 6 (six) hours as needed.    . Insulin Glargine (LANTUS SOLOSTAR) 100 UNIT/ML Solostar Pen Inject 30 Units into the skin at bedtime. 5 pen 5  . lisinopril (PRINIVIL,ZESTRIL) 5 MG tablet Take 1 tablet (5 mg total) by mouth daily. 30 tablet 5  . metFORMIN (GLUCOPHAGE) 1000 MG tablet Take 1 tablet (1,000 mg total) by mouth 2 (two) times daily with a meal. Restart 48 hrs after discharge. 60 tablet 3  . metoprolol tartrate (LOPRESSOR) 25 MG tablet Take 0.5 tablets (12.5 mg total) by mouth 2 (two) times daily. 90 tablet 3  . oxybutynin (DITROPAN) 5 MG tablet TAKE 1 TABLET BY MOUTH 2 TIMES DAILY. 60 tablet 2  . pantoprazole (PROTONIX) 20 MG tablet Take 1 tablet (20 mg total) by mouth daily. 30  tablet 1  . potassium chloride (K-DUR) 10 MEQ tablet Take 1 tablet (10 mEq total) by mouth daily. 30 tablet 1  . pregabalin (LYRICA) 100 MG capsule Take 1 capsule (100 mg total) by mouth 2 (two) times daily. 60 capsule 3  . promethazine (PHENERGAN) 25 MG tablet Take 12.5-25 mg by mouth every 6 (six) hours as needed for nausea or vomiting.     No current facility-administered medications on file prior to visit.       Review of Systems Constitutional: Negative for activity change, appetite change and positive for fatigue.  HENT: Negative for congestion, sinus pressure and sore throat.   Eyes: Negative for visual disturbance.  Respiratory: Negative for cough, chest tightness, shortness of breath and wheezing.   Cardiovascular: Negative for chest pain and palpitations.  Gastrointestinal: Negative for abdominal distention, abdominal pain and constipation.  Endocrine: Negative for polydipsia.  Genitourinary: Negative for dysuria and frequency.  Musculoskeletal: Negative for arthralgias and back pain.  positive for leg  pains Skin: Positive for wound. Negative for rash.  Neurological: Positive for numbness. Negative for tremors and positive for light-headedness.  Hematological: Does not bruise/bleed easily.  Psychiatric/Behavioral: Negative for agitation, behavioral problems and suicidal ideas.     Objective: Vitals:   08/28/16 1337  BP: 118/72  Pulse: 68  Temp: 98.2 F (36.8 C)  TempSrc: Oral  SpO2: 95%  Weight: 135 lb 12.8 oz (61.6 kg)  Height: 5\' 2"  (1.575 m)      Physical Exam Constitutional: She is oriented to person, place, and time. She appears well-developed and well-nourished.  HEENT: Slight maxillary sinus tenderness on the left, right TM occluded by cerumen, left TM normal, normal oropharynx Cardiovascular: Normal rate and normal heart sounds.   No murmur heard. Unable to palpate dorsalis pedis  or posterior tibial Pulmonary/Chest: Effort normal and breath sounds  normal. She has no wheezes. She has no rales. She exhibits no tenderness.  Abdominal: Soft. Bowel sounds are normal. She exhibits no distension and no mass. There is no tenderness.  Musculoskeletal: Normal range of motion. She exhibits 1+ bilateral pitting edema.  dry gangrene of left big toe with slight serous drainage on the medial aspect  Neurological: She is alert and oriented to person, place, and time.  Skin: Ulcer in both groin with granulation tissue; lateral aspect of left leg with old diabetic ulcer, superficial scab Psych: Normal   Lab Results  Component Value Date   HGBA1C 6.8 08/28/2016            Assessment & Plan:  1. DM type 2 with diabetic peripheral neuropathy (HCC) A1c of 6.8 which is down from 8.5 previously Uncontrolled neuropathy -Uncontrolled on gabapentin  Lyrica Denied by insurance Placed on Cymbalta and he she feels will try prior authorization for Lyrica again - POCT glucose (manual entry) - Insulin Glargine (LANTUS SOLOSTAR) 100 UNIT/ML Solostar Pen; Inject 30 Units into the skin at bedtime.  Dispense: 5 pen; Refill: 5  2. Peripheral Artery Disease / Wound infection Status post bilateral femoral endarterectomy and femoral to below knee popliteal artery bypass with graft Wound sites are healing  3. Pure hypercholesterolemia Low cholesterol diet Continue statin  4. Essential hypertension Blood pressure is on the low side Asymptomatic - lisinopril (PRINIVIL,ZESTRIL) 5 MG tablet; Take 1 tablet (5 mg total) by mouth daily.  Dispense: 30 tablet; Refill: 5  5. Coronary artery disease involving coronary bypass graft of native heart with unstable angina pectoris (HCC) Risk factor modification Continue statin  6. Gastroesophageal reflux disease without esophagitis Stable - pantoprazole (PROTONIX) 20 MG tablet; Take 1 tablet (20 mg total) by mouth daily.  Dispense: 30 tablet; Refill: 1  7. Adjustment reaction with anxiety and depression Due to  multiple underlying medical conditions Switched from Celexa to Cymbalta as the latter has dual effects on depression and pain LCSW to see for therapy  8.Dizziness Head CT normal Resolved  9. UTI Treated with Bactrim   This note has been created with Education officer, environmental. Any transcriptional errors are unintentional.

## 2016-08-28 NOTE — Addendum Note (Signed)
Addended by: Burton Apley A on: 08/28/2016 12:19 PM   Modules accepted: Orders

## 2016-08-28 NOTE — Progress Notes (Signed)
Previous prior authorization was cancelled by [facility] as a PA was submitted earlier this year and denied and they considered it a duplicate. Resent prior auth however I believe they will require patient to try and fail duloxetine before they will approve Lyrica, even though patient has already failed gabapentin.

## 2016-08-31 ENCOUNTER — Telehealth: Payer: Self-pay

## 2016-08-31 NOTE — Telephone Encounter (Signed)
Call placed to the patient and informed that Dr Venetia Night would like her to stop taking the celexa and has ordered Cymbalta for her. This CM spelled out celexa for her and then informed her that the order for cymbalta was sent to [facility]. Explained that the cymbalta should help with the pain and depression.  The patient was very appreciative of the call.

## 2016-09-02 ENCOUNTER — Other Ambulatory Visit: Payer: Self-pay

## 2016-09-02 MED ORDER — DULOXETINE HCL 60 MG PO CPEP: 60.0000 mg | ORAL_CAPSULE | Freq: Every day | ORAL | 1 refills | Status: DC

## 2016-09-02 MED FILL — DULoxetine HCL 60 MG CPEP: 60 | 30 days supply | Qty: 30 | Fill #0

## 2016-09-03 MED FILL — PANTOPRAZOLE SOD DR 20 MG T: 20 | 30 days supply | Qty: 30 | Fill #0

## 2016-09-09 ENCOUNTER — Encounter: Payer: Self-pay | Admitting: Family Medicine

## 2016-09-09 ENCOUNTER — Telehealth: Payer: Self-pay | Admitting: *Deleted

## 2016-09-09 ENCOUNTER — Ambulatory Visit (HOSPITAL_BASED_OUTPATIENT_CLINIC_OR_DEPARTMENT_OTHER): Payer: BLUE CROSS/BLUE SHIELD | Admitting: Family Medicine

## 2016-09-09 ENCOUNTER — Encounter: Payer: Self-pay | Admitting: Vascular Surgery

## 2016-09-09 VITALS — BP 120/70 | HR 70 | Temp 98.1°F | Ht 62.0 in | Wt 128.2 lb

## 2016-09-09 DIAGNOSIS — Z955 Presence of coronary angioplasty implant and graft: Secondary | ICD-10-CM

## 2016-09-09 DIAGNOSIS — Z7902 Long term (current) use of antithrombotics/antiplatelets: Secondary | ICD-10-CM

## 2016-09-09 DIAGNOSIS — R1900 Intra-abdominal and pelvic swelling, mass and lump, unspecified site: Secondary | ICD-10-CM

## 2016-09-09 DIAGNOSIS — I739 Peripheral vascular disease, unspecified: Secondary | ICD-10-CM

## 2016-09-09 DIAGNOSIS — R1909 Other intra-abdominal and pelvic swelling, mass and lump: Secondary | ICD-10-CM | POA: Diagnosis not present

## 2016-09-09 DIAGNOSIS — I2510 Atherosclerotic heart disease of native coronary artery without angina pectoris: Secondary | ICD-10-CM | POA: Insufficient documentation

## 2016-09-09 DIAGNOSIS — Z951 Presence of aortocoronary bypass graft: Secondary | ICD-10-CM | POA: Insufficient documentation

## 2016-09-09 DIAGNOSIS — M797 Fibromyalgia: Secondary | ICD-10-CM

## 2016-09-09 DIAGNOSIS — Z7982 Long term (current) use of aspirin: Secondary | ICD-10-CM

## 2016-09-09 DIAGNOSIS — J45909 Unspecified asthma, uncomplicated: Secondary | ICD-10-CM

## 2016-09-09 DIAGNOSIS — Z79899 Other long term (current) drug therapy: Secondary | ICD-10-CM | POA: Insufficient documentation

## 2016-09-09 DIAGNOSIS — K219 Gastro-esophageal reflux disease without esophagitis: Secondary | ICD-10-CM | POA: Insufficient documentation

## 2016-09-09 DIAGNOSIS — E119 Type 2 diabetes mellitus without complications: Secondary | ICD-10-CM | POA: Diagnosis not present

## 2016-09-09 DIAGNOSIS — Z794 Long term (current) use of insulin: Secondary | ICD-10-CM

## 2016-09-09 DIAGNOSIS — E7800 Pure hypercholesterolemia, unspecified: Secondary | ICD-10-CM | POA: Insufficient documentation

## 2016-09-09 DIAGNOSIS — I10 Essential (primary) hypertension: Secondary | ICD-10-CM | POA: Insufficient documentation

## 2016-09-09 DIAGNOSIS — E1151 Type 2 diabetes mellitus with diabetic peripheral angiopathy without gangrene: Secondary | ICD-10-CM | POA: Insufficient documentation

## 2016-09-09 LAB — GLUCOSE, POCT (MANUAL RESULT ENTRY): POC GLUCOSE: 151 mg/dL — AB (ref 70–99)

## 2016-09-09 NOTE — Patient Instructions (Addendum)
APPOINTMENT : (spoke to Gaye,Triage Nurse to schedule appointment)  Dr. Randie Heinz  September 10, 2016  12:00 noon

## 2016-09-09 NOTE — Telephone Encounter (Signed)
Sched apppt 09/10/16 at 12:30. Lm on cell#.

## 2016-09-09 NOTE — Telephone Encounter (Signed)
Dr. Venetia NightAmao called to report that Andrea [name] had a large "inguinal hernia" over her left groin X 3 days. She is post Left fem-BK pop BPG on 07-20-16 by Dr. Randie Heinzain. We will see her tomorrow in the office with a work in appt for Dr. Randie Heinzain.

## 2016-09-09 NOTE — Progress Notes (Signed)
Subjective:  Patient ID: Andrea [name], female    DOB: 01/10/1951  Age: 66 y.o. MRN: [id number]  CC: Hernia   HPI Andrea [name] s a 66 year old female with a history of uncontrolled type 2 diabetes mellitus (A1c 8.5 down from 8.9 previously), hypertension,Coronary artery disease status post CABG in 2006, status post stent in 2000  peripheral vascular disease status post bilateral  common femoral endarterectomy and left femoral to Low knee popliteal artery bypass who presents today for an acute visit with progressive left inguinal swelling over the last 3 days at the site of her femoral endarterectomy.  Complains of extreme burning and pain at the site. Denies fever, myalgias, nausea or vomiting, no abdominal pain. She had called her vascular surgeon's office (Dr Randie Heinz) and was informed to see her PCP.  Denies recent trauma.  Past Medical History:  Diagnosis Date  . Asthma   . Coronary artery disease   . Diabetes mellitus without complication (HCC)   . Dyspnea   . Fibromyalgia   . GERD (gastroesophageal reflux disease)   . High cholesterol   . Hypertension   . PONV (postoperative nausea and vomiting)   . PVD (peripheral vascular disease) (HCC)   . Urinary frequency   . Urinary incontinence     Past Surgical History:  Procedure Laterality Date  . ABDOMINAL AORTOGRAM W/LOWER EXTREMITY N/A 03/25/2016   Procedure: Abdominal Aortogram w/Lower Extremity;  Surgeon: Maeola Harman, MD;  Location: [facility] INVASIVE CV LAB;  Service: Cardiovascular;  Laterality: N/A;  . APPLICATION OF WOUND VAC Right 06/14/2016   Procedure: APPLICATION OF WOUND VAC;  Surgeon: Sherren Kerns, MD;  Location: [facility] OR;  Service: Vascular;  Laterality: Right;  . CORONARY ARTERY BYPASS GRAFT    . CORONARY STENT INTERVENTION N/A 04/15/2016   Procedure: Coronary Stent Intervention;  Surgeon: Lennette Bihari, MD;  Location: [facility] INVASIVE CV LAB;  Service: Cardiovascular;  Laterality: N/A;  . ENDARTERECTOMY  FEMORAL Right 05/21/2016   Procedure: RIGHT FEMORAL ENDARTERECTOMY;  Surgeon: Maeola Harman, MD;  Location: [facility] OR;  Service: Vascular;  Laterality: Right;  . ENDARTERECTOMY FEMORAL Left 07/20/2016   Procedure: LEFT FEMORAL ENDARTERECTOMY;  Surgeon: Maeola Harman, MD;  Location: [facility] OR;  Service: Vascular;  Laterality: Left;  . FEMORAL BYPASS Left 07/20/2016   Left common femoral endarterectomy  . FEMORAL-POPLITEAL BYPASS GRAFT Right 05/21/2016   Procedure: RIGHT FEMORAL-BELOW KNEE POPLITEAL ARTERY BYPASS GRAFT USING PROPATEN GORETEX GRAFT;  Surgeon: Maeola Harman, MD;  Location: [facility] OR;  Service: Vascular;  Laterality: Right;  . FEMORAL-POPLITEAL BYPASS GRAFT Left 07/20/2016   Procedure: LEFT FEMORAL TO BELOW KNEE POPLITEAL ARTERY  BYPASS GRAFT;  Surgeon: Maeola Harman, MD;  Location: [facility] OR;  Service: Vascular;  Laterality: Left;  . GROIN DEBRIDEMENT Right 06/14/2016   Procedure: GROIN DEBRIDEMENT;  Surgeon: Sherren Kerns, MD;  Location: [facility] OR;  Service: Vascular;  Laterality: Right;  . KNEE SURGERY    . LEFT HEART CATH AND CORS/GRAFTS ANGIOGRAPHY N/A 04/15/2016   Procedure: Left Heart Cath and Cors/Grafts Angiography;  Surgeon: Lennette Bihari, MD;  Location: [facility] INVASIVE CV LAB;  Service: Cardiovascular;  Laterality: N/A;  . MULTIPLE TOOTH EXTRACTIONS       Outpatient Medications Prior to Visit  Medication Sig Dispense Refill  . acetaminophen-codeine (TYLENOL #3) 300-30 MG tablet Take 1 tablet by mouth every 8 (eight) hours as needed for moderate pain. 60 tablet 0  . albuterol (PROVENTIL HFA;VENTOLIN HFA) 108 (90 Base) MCG/ACT  inhaler Inhale 2 puffs into the lungs every 6 (six) hours as needed for wheezing or shortness of breath. 54 Inhaler 3  . aspirin EC 81 MG tablet Take 1 tablet (81 mg total) by mouth daily. 90 tablet 3  . atorvastatin (LIPITOR) 80 MG tablet Take 1 tablet (80 mg total) by mouth daily. (Patient taking differently: Take 80 mg by mouth  at bedtime. ) 90 tablet 3  . carbamide peroxide (DEBROX) 6.5 % otic solution Place 5 drops into both ears daily as needed (wax buildup).    . clopidogrel (PLAVIX) 75 MG tablet Take 1 tablet (75 mg total) by mouth daily with breakfast. 90 tablet 3  . cyclobenzaprine (FLEXERIL) 5 MG tablet Take 5 mg by mouth every 6 (six) hours as needed (pain/muscle spams.).   0  . DULoxetine (CYMBALTA) 60 MG capsule Take 1 capsule (60 mg total) by mouth daily. 60 capsule 1  . Fluticasone-Salmeterol (ADVAIR) 100-50 MCG/DOSE AEPB Inhale 1 puff into the lungs 2 (two) times daily. 180 each 3  . furosemide (LASIX) 20 MG tablet Take 1 tablet (20 mg total) by mouth daily. 30 tablet 1  . hydrochlorothiazide (HYDRODIURIL) 25 MG tablet Take 25 mg by mouth daily.  0  . HYDROcodone-acetaminophen (NORCO/VICODIN) 5-325 MG tablet Take 1 tablet by mouth every 6 (six) hours as needed for moderate pain. 30 tablet 0  . ibuprofen (ADVIL,MOTRIN) 200 MG tablet Take 400 mg by mouth every 6 (six) hours as needed.    . Insulin Glargine (LANTUS SOLOSTAR) 100 UNIT/ML Solostar Pen Inject 30 Units into the skin at bedtime. 5 pen 5  . lisinopril (PRINIVIL,ZESTRIL) 5 MG tablet Take 1 tablet (5 mg total) by mouth daily. 30 tablet 5  . metFORMIN (GLUCOPHAGE) 1000 MG tablet Take 1 tablet (1,000 mg total) by mouth 2 (two) times daily with a meal. Restart 48 hrs after discharge. 60 tablet 3  . metoprolol tartrate (LOPRESSOR) 25 MG tablet Take 0.5 tablets (12.5 mg total) by mouth 2 (two) times daily. 90 tablet 3  . oxybutynin (DITROPAN) 5 MG tablet TAKE 1 TABLET BY MOUTH 2 TIMES DAILY. 60 tablet 2  . pantoprazole (PROTONIX) 20 MG tablet Take 1 tablet (20 mg total) by mouth daily. 30 tablet 1  . potassium chloride (K-DUR) 10 MEQ tablet Take 1 tablet (10 mEq total) by mouth daily. 30 tablet 1  . pregabalin (LYRICA) 100 MG capsule Take 1 capsule (100 mg total) by mouth 2 (two) times daily. 60 capsule 3  . promethazine (PHENERGAN) 25 MG tablet Take  12.5-25 mg by mouth every 6 (six) hours as needed for nausea or vomiting.    . sulfamethoxazole-trimethoprim (BACTRIM DS,SEPTRA DS) 800-160 MG tablet Take 1 tablet by mouth 2 (two) times daily. 20 tablet 0   No facility-administered medications prior to visit.     ROS Review of Systems  Constitutional: Negative for activity change, appetite change and fatigue.  HENT: Negative for congestion, sinus pressure and sore throat.   Eyes: Negative for visual disturbance.  Respiratory: Negative for cough, chest tightness, shortness of breath and wheezing.   Cardiovascular: Negative for chest pain and palpitations.  Gastrointestinal: Negative for abdominal distention, abdominal pain and constipation.  Endocrine: Negative for polydipsia.  Genitourinary: Negative for dysuria and frequency.  Musculoskeletal:       See hpi  Skin: Negative for rash.  Neurological: Positive for numbness. Negative for tremors and light-headedness.  Hematological: Does not bruise/bleed easily.  Psychiatric/Behavioral: Negative for agitation and behavioral problems.  Objective:  BP 120/70   Pulse 70   Temp 98.1 F (36.7 C) (Oral)   Ht 5\' 2"  (1.575 m)   Wt 128 lb 3.2 oz (58.2 kg)   SpO2 (!) 82%   BMI 23.45 kg/m   BP/Weight 09/09/2016 08/28/2016 08/21/2016  Systolic BP 120 118 97  Diastolic BP 70 72 43  Wt. (Lbs) 128.2 135.8 131.6  BMI 23.45 24.84 24.07      Physical Exam Constitutional: She is oriented to person, place, and time. She appears well-developed and well-nourished.  Cardiovascular: Normal rate and normal heart sounds.   No murmur heard. Unable to palpate dorsalis pedis  or posterior tibial Pulmonary/Chest: Effort normal and breath sounds normal. She has no wheezes. She has no rales. She exhibits no tenderness.  Abdominal: Soft. Bowel sounds are normal. She exhibits no distension and no mass. There is no tenderness.  Genitourinary: Right healed inguinal scar. Left inguinal region with large  induration extending into the upper femoral region which is hard, normal to touch with no discharge. Associated tenderness to palpation  Musculoskeletal: Normal range of motion. She exhibits no edema  dry gangrene of left big toe with slight serous drainage on the medial aspect  Neurological: She is alert and oriented to person, place, and time.  Skin: Ulcer in both groin with granulation tissue; lateral aspect of left leg with old diabetic ulcer, superficial scab Psych: Normal   Lab Results  Component Value Date   HGBA1C 6.8 08/28/2016    Assessment & Plan:   1. Diabetes mellitus without complication (HCC) Controlled with A1c of 6.8 Continue current medications - POCT glucose (manual entry)  2. Swelling of inguinal region Incisional hernia versus hematoma at site of femoral endarterectomy Will call Dr. Darcella Cheshire office for an appointment STAT Appointment obtained for tomorrow at 12 noon  3. PAD (peripheral artery disease) (HCC) Status post bilateral femoral endarterectomy and bypass Risk factor modification Continue statin and Plavix   No orders of the defined types were placed in this encounter.   Follow-up: Keep previously scheduled appointment for follow-up of diabetes mellitus   Jaclyn Shaggy MD

## 2016-09-10 ENCOUNTER — Inpatient Hospital Stay (HOSPITAL_COMMUNITY): Payer: BLUE CROSS/BLUE SHIELD | Admitting: Certified Registered"

## 2016-09-10 ENCOUNTER — Encounter (HOSPITAL_COMMUNITY)
Admission: AD | Disposition: A | Discharge status: Skilled Nursing Facility | Payer: Self-pay | Source: Ambulatory Visit | Attending: Vascular Surgery

## 2016-09-10 ENCOUNTER — Encounter (HOSPITAL_COMMUNITY): Payer: Self-pay | Admitting: Orthopedic Surgery

## 2016-09-10 ENCOUNTER — Ambulatory Visit (INDEPENDENT_AMBULATORY_CARE_PROVIDER_SITE_OTHER): Payer: Self-pay | Admitting: Vascular Surgery

## 2016-09-10 ENCOUNTER — Inpatient Hospital Stay (HOSPITAL_COMMUNITY)
Admission: AD | Admit: 2016-09-10 | Discharge: 2016-09-24 | DRG: 271 | Disposition: A | Discharge status: Skilled Nursing Facility | Payer: BLUE CROSS/BLUE SHIELD | Source: Ambulatory Visit | Attending: Vascular Surgery | Admitting: Vascular Surgery

## 2016-09-10 ENCOUNTER — Encounter: Payer: Self-pay | Admitting: Vascular Surgery

## 2016-09-10 ENCOUNTER — Other Ambulatory Visit: Payer: Self-pay

## 2016-09-10 VITALS — BP 89/57 | HR 90 | Temp 101.3°F | Resp 18 | Ht 62.0 in | Wt 126.1 lb

## 2016-09-10 DIAGNOSIS — Z87891 Personal history of nicotine dependence: Secondary | ICD-10-CM

## 2016-09-10 DIAGNOSIS — Y832 Surgical operation with anastomosis, bypass or graft as the cause of abnormal reaction of the patient, or of later complication, without mention of misadventure at the time of the procedure: Secondary | ICD-10-CM | POA: Diagnosis present

## 2016-09-10 DIAGNOSIS — I2510 Atherosclerotic heart disease of native coronary artery without angina pectoris: Secondary | ICD-10-CM | POA: Diagnosis present

## 2016-09-10 DIAGNOSIS — Z955 Presence of coronary angioplasty implant and graft: Secondary | ICD-10-CM | POA: Diagnosis not present

## 2016-09-10 DIAGNOSIS — Z794 Long term (current) use of insulin: Secondary | ICD-10-CM

## 2016-09-10 DIAGNOSIS — B9562 Methicillin resistant Staphylococcus aureus infection as the cause of diseases classified elsewhere: Secondary | ICD-10-CM | POA: Diagnosis present

## 2016-09-10 DIAGNOSIS — T814XXA Infection following a procedure, initial encounter: Secondary | ICD-10-CM

## 2016-09-10 DIAGNOSIS — K219 Gastro-esophageal reflux disease without esophagitis: Secondary | ICD-10-CM | POA: Diagnosis present

## 2016-09-10 DIAGNOSIS — E1152 Type 2 diabetes mellitus with diabetic peripheral angiopathy with gangrene: Secondary | ICD-10-CM | POA: Diagnosis present

## 2016-09-10 DIAGNOSIS — Z886 Allergy status to analgesic agent status: Secondary | ICD-10-CM

## 2016-09-10 DIAGNOSIS — I998 Other disorder of circulatory system: Secondary | ICD-10-CM

## 2016-09-10 DIAGNOSIS — T8132XA Disruption of internal operation (surgical) wound, not elsewhere classified, initial encounter: Secondary | ICD-10-CM | POA: Diagnosis present

## 2016-09-10 DIAGNOSIS — E7800 Pure hypercholesterolemia, unspecified: Secondary | ICD-10-CM | POA: Diagnosis present

## 2016-09-10 DIAGNOSIS — M797 Fibromyalgia: Secondary | ICD-10-CM | POA: Diagnosis present

## 2016-09-10 DIAGNOSIS — Z885 Allergy status to narcotic agent status: Secondary | ICD-10-CM

## 2016-09-10 DIAGNOSIS — E876 Hypokalemia: Secondary | ICD-10-CM | POA: Diagnosis not present

## 2016-09-10 DIAGNOSIS — I10 Essential (primary) hypertension: Secondary | ICD-10-CM | POA: Diagnosis present

## 2016-09-10 DIAGNOSIS — Z7982 Long term (current) use of aspirin: Secondary | ICD-10-CM

## 2016-09-10 DIAGNOSIS — Z7902 Long term (current) use of antithrombotics/antiplatelets: Secondary | ICD-10-CM | POA: Diagnosis not present

## 2016-09-10 DIAGNOSIS — T827XXD Infection and inflammatory reaction due to other cardiac and vascular devices, implants and grafts, subsequent encounter: Secondary | ICD-10-CM | POA: Diagnosis not present

## 2016-09-10 DIAGNOSIS — T827XXA Infection and inflammatory reaction due to other cardiac and vascular devices, implants and grafts, initial encounter: Secondary | ICD-10-CM | POA: Diagnosis present

## 2016-09-10 DIAGNOSIS — Z951 Presence of aortocoronary bypass graft: Secondary | ICD-10-CM | POA: Diagnosis not present

## 2016-09-10 DIAGNOSIS — W19XXXA Unspecified fall, initial encounter: Secondary | ICD-10-CM

## 2016-09-10 DIAGNOSIS — I739 Peripheral vascular disease, unspecified: Secondary | ICD-10-CM

## 2016-09-10 DIAGNOSIS — Z79899 Other long term (current) drug therapy: Secondary | ICD-10-CM | POA: Diagnosis not present

## 2016-09-10 DIAGNOSIS — I70202 Unspecified atherosclerosis of native arteries of extremities, left leg: Secondary | ICD-10-CM | POA: Diagnosis present

## 2016-09-10 DIAGNOSIS — Y838 Other surgical procedures as the cause of abnormal reaction of the patient, or of later complication, without mention of misadventure at the time of the procedure: Secondary | ICD-10-CM | POA: Diagnosis not present

## 2016-09-10 HISTORY — PX: PATCH ANGIOPLASTY: SHX6230

## 2016-09-10 HISTORY — PX: REMOVAL OF GRAFT: SHX6361

## 2016-09-10 LAB — GRAM STAIN

## 2016-09-10 LAB — CBC
HCT: 25.1 % — ABNORMAL LOW (ref 36.0–46.0)
Hemoglobin: 8 g/dL — ABNORMAL LOW (ref 12.0–15.0)
MCH: 28.7 pg (ref 26.0–34.0)
MCHC: 31.9 g/dL (ref 30.0–36.0)
MCV: 90 fL (ref 78.0–100.0)
PLATELETS: 315 10*3/uL (ref 150–400)
RBC: 2.79 MIL/uL — ABNORMAL LOW (ref 3.87–5.11)
RDW: 15.1 % (ref 11.5–15.5)
WBC: 9.1 10*3/uL (ref 4.0–10.5)

## 2016-09-10 LAB — COMPREHENSIVE METABOLIC PANEL
ALK PHOS: 56 U/L (ref 38–126)
ALT: 12 U/L — ABNORMAL LOW (ref 14–54)
ANION GAP: 15 (ref 5–15)
AST: 18 U/L (ref 15–41)
Albumin: 3.2 g/dL — ABNORMAL LOW (ref 3.5–5.0)
BILIRUBIN TOTAL: 0.6 mg/dL (ref 0.3–1.2)
BUN: 42 mg/dL — ABNORMAL HIGH (ref 6–20)
CALCIUM: 9.4 mg/dL (ref 8.9–10.3)
CO2: 22 mmol/L (ref 22–32)
CREATININE: 1.51 mg/dL — AB (ref 0.44–1.00)
Chloride: 100 mmol/L — ABNORMAL LOW (ref 101–111)
GFR, EST AFRICAN AMERICAN: 41 mL/min — AB (ref >60)
GFR, EST NON AFRICAN AMERICAN: 36 mL/min — AB (ref >60)
Glucose, Bld: 161 mg/dL — ABNORMAL HIGH (ref 65–99)
Potassium: 5.1 mmol/L (ref 3.5–5.1)
SODIUM: 137 mmol/L (ref 135–145)
TOTAL PROTEIN: 7.5 g/dL (ref 6.5–8.1)

## 2016-09-10 LAB — PREPARE RBC (CROSSMATCH)

## 2016-09-10 LAB — PROTIME-INR
INR: 1.09
Prothrombin Time: 14 seconds (ref 11.4–15.2)

## 2016-09-10 LAB — GLUCOSE, CAPILLARY
GLUCOSE-CAPILLARY: 169 mg/dL — AB (ref 65–99)
GLUCOSE-CAPILLARY: 208 mg/dL — AB (ref 65–99)
Glucose-Capillary: 271 mg/dL — ABNORMAL HIGH (ref 65–99)

## 2016-09-10 LAB — APTT: APTT: 29 s (ref 24–36)

## 2016-09-10 SURGERY — REMOVAL, GRAFT
Anesthesia: General | Site: Leg Upper | Laterality: Left

## 2016-09-10 MED ORDER — POLYETHYLENE GLYCOL 3350 17 G PO PACK
17.0000 g | PACK | Freq: Every day | ORAL | Status: DC | PRN
  Administered 2016-09-20: 17 g via ORAL
  Filled 2016-09-10: qty 1

## 2016-09-10 MED ORDER — PHENOL 1.4 % MT LIQD: 1.0000 | OROMUCOSAL | Status: DC | PRN

## 2016-09-10 MED ORDER — PHENYLEPHRINE 40 MCG/ML (10ML) SYRINGE FOR IV PUSH (FOR BLOOD PRESSURE SUPPORT)
PREFILLED_SYRINGE | INTRAVENOUS | Status: DC | PRN
  Administered 2016-09-10 (×2): 160 ug via INTRAVENOUS
  Administered 2016-09-10 (×2): 80 ug via INTRAVENOUS
  Administered 2016-09-10: 40 ug via INTRAVENOUS
  Administered 2016-09-10: 80 ug via INTRAVENOUS
  Administered 2016-09-10: 40 ug via INTRAVENOUS

## 2016-09-10 MED ORDER — CYCLOBENZAPRINE HCL 10 MG PO TABS
5.0000 mg | ORAL_TABLET | Freq: Four times a day (QID) | ORAL | Status: DC | PRN
  Administered 2016-09-12: 5 mg via ORAL
  Filled 2016-09-10: qty 1

## 2016-09-10 MED ORDER — PIPERACILLIN-TAZOBACTAM 3.375 G IVPB
3.3750 g | Freq: Three times a day (TID) | INTRAVENOUS | Status: DC
  Administered 2016-09-10 – 2016-09-12 (×5): 3.375 g via INTRAVENOUS
  Filled 2016-09-10 (×8): qty 50

## 2016-09-10 MED ORDER — METOPROLOL TARTRATE 5 MG/5ML IV SOLN: 2.0000 mg | INTRAVENOUS | Status: DC | PRN

## 2016-09-10 MED ORDER — SODIUM CHLORIDE 0.9 % IV SOLN: INTRAVENOUS | Status: DC

## 2016-09-10 MED ORDER — ROCURONIUM BROMIDE 10 MG/ML (PF) SYRINGE
PREFILLED_SYRINGE | INTRAVENOUS | Status: DC | PRN
  Administered 2016-09-10: 30 mg via INTRAVENOUS
  Administered 2016-09-10: 50 mg via INTRAVENOUS
  Administered 2016-09-10 (×2): 20 mg via INTRAVENOUS

## 2016-09-10 MED ORDER — SUCCINYLCHOLINE CHLORIDE 200 MG/10ML IV SOSY
PREFILLED_SYRINGE | INTRAVENOUS | Status: DC | PRN
  Administered 2016-09-10: 60 mg via INTRAVENOUS

## 2016-09-10 MED ORDER — ALBUTEROL SULFATE (2.5 MG/3ML) 0.083% IN NEBU: 2.5000 mg | INHALATION_SOLUTION | Freq: Four times a day (QID) | RESPIRATORY_TRACT | Status: DC | PRN

## 2016-09-10 MED ORDER — BISACODYL 10 MG RE SUPP: 10.0000 mg | Freq: Every day | RECTAL | Status: DC | PRN

## 2016-09-10 MED ORDER — ONDANSETRON HCL 4 MG/2ML IJ SOLN
INTRAMUSCULAR | Status: DC | PRN
  Administered 2016-09-10: 4 mg via INTRAVENOUS

## 2016-09-10 MED ORDER — SCOPOLAMINE 1 MG/3DAYS TD PT72
MEDICATED_PATCH | TRANSDERMAL | Status: DC | PRN
  Administered 2016-09-10: 1 via TRANSDERMAL

## 2016-09-10 MED ORDER — CHLORHEXIDINE GLUCONATE CLOTH 2 % EX PADS: 6.0000 | MEDICATED_PAD | Freq: Once | CUTANEOUS | Status: DC

## 2016-09-10 MED ORDER — PROPOFOL 10 MG/ML IV BOLUS
INTRAVENOUS | Status: AC
Start: 2016-09-10 — End: 2016-09-10
  Filled 2016-09-10: qty 20

## 2016-09-10 MED ORDER — FENTANYL CITRATE (PF) 250 MCG/5ML IJ SOLN
INTRAMUSCULAR | Status: AC
  Filled 2016-09-10: qty 5

## 2016-09-10 MED ORDER — METOPROLOL TARTRATE 12.5 MG HALF TABLET
12.5000 mg | ORAL_TABLET | Freq: Once | ORAL | Status: AC
  Administered 2016-09-10: 12.5 mg via ORAL
  Filled 2016-09-10: qty 1

## 2016-09-10 MED ORDER — LACTATED RINGERS IV SOLN
INTRAVENOUS | Status: DC | PRN
  Administered 2016-09-10 (×3): via INTRAVENOUS

## 2016-09-10 MED ORDER — SODIUM CHLORIDE 0.9 % IV SOLN
INTRAVENOUS | Status: DC | PRN
  Administered 2016-09-10: 500 mL

## 2016-09-10 MED ORDER — MOMETASONE FURO-FORMOTEROL FUM 100-5 MCG/ACT IN AERO
2.0000 | INHALATION_SPRAY | Freq: Two times a day (BID) | RESPIRATORY_TRACT | Status: DC
  Administered 2016-09-11 – 2016-09-23 (×22): 2 via RESPIRATORY_TRACT
  Filled 2016-09-10 (×2): qty 8.8

## 2016-09-10 MED ORDER — SODIUM CHLORIDE 0.9 % IV SOLN: 500.0000 mL | Freq: Once | INTRAVENOUS | Status: DC | PRN

## 2016-09-10 MED ORDER — ASPIRIN EC 81 MG PO TBEC
81.0000 mg | DELAYED_RELEASE_TABLET | Freq: Every day | ORAL | Status: DC
Start: 2016-09-11 — End: 2016-09-24
  Administered 2016-09-11 – 2016-09-24 (×14): 81 mg via ORAL
  Filled 2016-09-10 (×14): qty 1

## 2016-09-10 MED ORDER — MORPHINE SULFATE (PF) 2 MG/ML IV SOLN
2.0000 mg | INTRAVENOUS | Status: DC | PRN
  Administered 2016-09-10 (×2): 2 mg via INTRAVENOUS
  Filled 2016-09-10 (×3): qty 1

## 2016-09-10 MED ORDER — VANCOMYCIN HCL 1000 MG IV SOLR
INTRAVENOUS | Status: DC | PRN
  Administered 2016-09-10: 1000 mg

## 2016-09-10 MED ORDER — ACETAMINOPHEN 650 MG RE SUPP: 325.0000 mg | RECTAL | Status: DC | PRN

## 2016-09-10 MED ORDER — HEMOSTATIC AGENTS (NO CHARGE) OPTIME
TOPICAL | Status: DC | PRN
  Administered 2016-09-10 (×2): 1 via TOPICAL

## 2016-09-10 MED ORDER — OXYBUTYNIN CHLORIDE 5 MG PO TABS
5.0000 mg | ORAL_TABLET | Freq: Two times a day (BID) | ORAL | Status: DC
  Administered 2016-09-10 – 2016-09-24 (×27): 5 mg via ORAL
  Filled 2016-09-10 (×27): qty 1

## 2016-09-10 MED ORDER — DEXTROSE 5 % IV SOLN
1.5000 g | Freq: Two times a day (BID) | INTRAVENOUS | Status: DC
  Filled 2016-09-10: qty 1.5

## 2016-09-10 MED ORDER — DIPHENHYDRAMINE HCL 12.5 MG/5ML PO ELIX
12.5000 mg | ORAL_SOLUTION | Freq: Four times a day (QID) | ORAL | Status: DC | PRN
  Filled 2016-09-10: qty 5

## 2016-09-10 MED ORDER — ATORVASTATIN CALCIUM 80 MG PO TABS
80.0000 mg | ORAL_TABLET | Freq: Every day | ORAL | Status: DC
  Administered 2016-09-10 – 2016-09-23 (×14): 80 mg via ORAL
  Filled 2016-09-10 (×8): qty 1
  Filled 2016-09-10: qty 2
  Filled 2016-09-10 (×5): qty 1

## 2016-09-10 MED ORDER — 0.9 % SODIUM CHLORIDE (POUR BTL) OPTIME
TOPICAL | Status: DC | PRN
  Administered 2016-09-10 (×2): 1000 mL
  Administered 2016-09-10: 3000 mL

## 2016-09-10 MED ORDER — DIPHENHYDRAMINE HCL 50 MG/ML IJ SOLN: 12.5000 mg | Freq: Four times a day (QID) | INTRAMUSCULAR | Status: DC | PRN

## 2016-09-10 MED ORDER — PROTAMINE SULFATE 10 MG/ML IV SOLN
INTRAVENOUS | Status: DC | PRN
  Administered 2016-09-10: 40 mg via INTRAVENOUS

## 2016-09-10 MED ORDER — MAGNESIUM SULFATE 2 GM/50ML IV SOLN: 2.0000 g | Freq: Every day | INTRAVENOUS | Status: DC | PRN

## 2016-09-10 MED ORDER — INSULIN ASPART 100 UNIT/ML ~~LOC~~ SOLN
0.0000 [IU] | Freq: Three times a day (TID) | SUBCUTANEOUS | Status: DC
  Administered 2016-09-11: 2 [IU] via SUBCUTANEOUS
  Administered 2016-09-11: 3 [IU] via SUBCUTANEOUS
  Administered 2016-09-11 – 2016-09-13 (×4): 2 [IU] via SUBCUTANEOUS
  Administered 2016-09-13: 1 [IU] via SUBCUTANEOUS
  Administered 2016-09-14 – 2016-09-17 (×2): 2 [IU] via SUBCUTANEOUS
  Administered 2016-09-20 – 2016-09-21 (×3): 1 [IU] via SUBCUTANEOUS
  Administered 2016-09-23 (×2): 2 [IU] via SUBCUTANEOUS
  Administered 2016-09-23 – 2016-09-24 (×2): 1 [IU] via SUBCUTANEOUS

## 2016-09-10 MED ORDER — HYDROCODONE-ACETAMINOPHEN 5-325 MG PO TABS
1.0000 | ORAL_TABLET | ORAL | Status: DC | PRN
  Administered 2016-09-10 (×2): 1 via ORAL
  Administered 2016-09-12 – 2016-09-22 (×5): 2 via ORAL
  Filled 2016-09-10: qty 2
  Filled 2016-09-10: qty 1
  Filled 2016-09-10 (×3): qty 2
  Filled 2016-09-10: qty 1
  Filled 2016-09-10: qty 2

## 2016-09-10 MED ORDER — METOPROLOL TARTRATE 12.5 MG HALF TABLET
ORAL_TABLET | ORAL | Status: AC
Start: 2016-09-10 — End: 2016-09-10
  Administered 2016-09-10: 12.5 mg via ORAL
  Filled 2016-09-10: qty 1

## 2016-09-10 MED ORDER — HEPARIN SODIUM (PORCINE) 1000 UNIT/ML IJ SOLN
INTRAMUSCULAR | Status: DC | PRN
  Administered 2016-09-10: 6000 [IU] via INTRAVENOUS

## 2016-09-10 MED ORDER — FUROSEMIDE 20 MG PO TABS
20.0000 mg | ORAL_TABLET | Freq: Every day | ORAL | Status: DC
  Administered 2016-09-10 – 2016-09-24 (×15): 20 mg via ORAL
  Filled 2016-09-10 (×15): qty 1

## 2016-09-10 MED ORDER — LABETALOL HCL 5 MG/ML IV SOLN: 10.0000 mg | INTRAVENOUS | Status: DC | PRN

## 2016-09-10 MED ORDER — PREGABALIN 100 MG PO CAPS
100.0000 mg | ORAL_CAPSULE | Freq: Two times a day (BID) | ORAL | Status: DC
  Administered 2016-09-10 – 2016-09-24 (×27): 100 mg via ORAL
  Filled 2016-09-10 (×27): qty 1

## 2016-09-10 MED ORDER — GENTAMICIN SULFATE 40 MG/ML IJ SOLN
INTRAMUSCULAR | Status: AC
  Filled 2016-09-10: qty 6

## 2016-09-10 MED ORDER — SODIUM CHLORIDE 0.9 % IV SOLN: 500.0000 mg | INTRAVENOUS | Status: DC

## 2016-09-10 MED ORDER — HYDROCHLOROTHIAZIDE 25 MG PO TABS
25.0000 mg | ORAL_TABLET | Freq: Every day | ORAL | Status: DC
  Administered 2016-09-11 – 2016-09-24 (×14): 25 mg via ORAL
  Filled 2016-09-10 (×14): qty 1

## 2016-09-10 MED ORDER — HYDROMORPHONE 1 MG/ML IV SOLN
INTRAVENOUS | Status: DC
  Administered 2016-09-11: 0 mg via INTRAVENOUS
  Administered 2016-09-11: 1 mg via INTRAVENOUS
  Administered 2016-09-11: 0.3 mg via INTRAVENOUS
  Administered 2016-09-11: 2.4 mg via INTRAVENOUS
  Filled 2016-09-10: qty 25

## 2016-09-10 MED ORDER — DULOXETINE HCL 60 MG PO CPEP
60.0000 mg | ORAL_CAPSULE | Freq: Every day | ORAL | Status: DC
  Administered 2016-09-11 – 2016-09-24 (×14): 60 mg via ORAL
  Filled 2016-09-10 (×14): qty 1

## 2016-09-10 MED ORDER — INSULIN GLARGINE 100 UNIT/ML ~~LOC~~ SOLN
30.0000 [IU] | Freq: Every day | SUBCUTANEOUS | Status: DC
  Administered 2016-09-11 – 2016-09-23 (×10): 30 [IU] via SUBCUTANEOUS
  Filled 2016-09-10 (×15): qty 0.3

## 2016-09-10 MED ORDER — GABAPENTIN 300 MG PO CAPS
300.0000 mg | ORAL_CAPSULE | Freq: Three times a day (TID) | ORAL | Status: DC
  Administered 2016-09-10 – 2016-09-24 (×38): 300 mg via ORAL
  Filled 2016-09-10 (×38): qty 1

## 2016-09-10 MED ORDER — SODIUM CHLORIDE 0.9 % IV SOLN
INTRAVENOUS | Status: DC
  Administered 2016-09-10 – 2016-09-15 (×2): via INTRAVENOUS
  Administered 2016-09-15: 1000 mL via INTRAVENOUS

## 2016-09-10 MED ORDER — LACTATED RINGERS IV SOLN
INTRAVENOUS | Status: DC
  Administered 2016-09-10: 15:00:00 via INTRAVENOUS

## 2016-09-10 MED ORDER — PANTOPRAZOLE SODIUM 20 MG PO TBEC
20.0000 mg | DELAYED_RELEASE_TABLET | Freq: Every day | ORAL | Status: DC
  Administered 2016-09-10 – 2016-09-24 (×15): 20 mg via ORAL
  Filled 2016-09-10 (×15): qty 1

## 2016-09-10 MED ORDER — ONDANSETRON HCL 4 MG/2ML IJ SOLN: 4.0000 mg | Freq: Four times a day (QID) | INTRAMUSCULAR | Status: DC | PRN

## 2016-09-10 MED ORDER — VANCOMYCIN HCL 10 G IV SOLR
1250.0000 mg | Freq: Once | INTRAVENOUS | Status: AC
  Administered 2016-09-10: 1250 mg via INTRAVENOUS
  Filled 2016-09-10: qty 1250

## 2016-09-10 MED ORDER — NALOXONE HCL 0.4 MG/ML IJ SOLN: 0.4000 mg | INTRAMUSCULAR | Status: DC | PRN

## 2016-09-10 MED ORDER — MIDAZOLAM HCL 2 MG/2ML IJ SOLN
INTRAMUSCULAR | Status: DC | PRN
  Administered 2016-09-10: 2 mg via INTRAVENOUS

## 2016-09-10 MED ORDER — METOPROLOL TARTRATE 12.5 MG HALF TABLET
12.5000 mg | ORAL_TABLET | Freq: Two times a day (BID) | ORAL | Status: DC
  Administered 2016-09-10 – 2016-09-24 (×22): 12.5 mg via ORAL
  Filled 2016-09-10 (×26): qty 1

## 2016-09-10 MED ORDER — DEXAMETHASONE SODIUM PHOSPHATE 10 MG/ML IJ SOLN
INTRAMUSCULAR | Status: DC | PRN
  Administered 2016-09-10: 10 mg via INTRAVENOUS

## 2016-09-10 MED ORDER — DOCUSATE SODIUM 100 MG PO CAPS
100.0000 mg | ORAL_CAPSULE | Freq: Every day | ORAL | Status: DC
  Administered 2016-09-11 – 2016-09-23 (×13): 100 mg via ORAL
  Filled 2016-09-10 (×13): qty 1

## 2016-09-10 MED ORDER — HYDRALAZINE HCL 20 MG/ML IJ SOLN
5.0000 mg | INTRAMUSCULAR | Status: DC | PRN
  Administered 2016-09-18: 5 mg via INTRAVENOUS
  Filled 2016-09-10: qty 1

## 2016-09-10 MED ORDER — GENTAMICIN SULFATE 40 MG/ML IJ SOLN
INTRAMUSCULAR | Status: DC | PRN
  Administered 2016-09-10: 240 mg

## 2016-09-10 MED ORDER — VANCOMYCIN HCL 1000 MG IV SOLR
INTRAVENOUS | Status: AC
  Filled 2016-09-10: qty 1000

## 2016-09-10 MED ORDER — ALBUMIN HUMAN 5 % IV SOLN
INTRAVENOUS | Status: DC | PRN
  Administered 2016-09-10: 17:00:00 via INTRAVENOUS

## 2016-09-10 MED ORDER — DEXTROSE 5 % IV SOLN
1.5000 g | INTRAVENOUS | Status: AC
  Administered 2016-09-10: 1.5 g via INTRAVENOUS
  Filled 2016-09-10: qty 1.5

## 2016-09-10 MED ORDER — GUAIFENESIN-DM 100-10 MG/5ML PO SYRP: 15.0000 mL | ORAL_SOLUTION | ORAL | Status: DC | PRN

## 2016-09-10 MED ORDER — SODIUM CHLORIDE 0.9 % IV SOLN: Freq: Once | INTRAVENOUS | Status: DC

## 2016-09-10 MED ORDER — ONDANSETRON HCL 4 MG/2ML IJ SOLN
4.0000 mg | Freq: Four times a day (QID) | INTRAMUSCULAR | Status: DC | PRN
  Administered 2016-09-11 – 2016-09-13 (×2): 4 mg via INTRAVENOUS
  Filled 2016-09-10 (×3): qty 2

## 2016-09-10 MED ORDER — SODIUM CHLORIDE 0.9% FLUSH: 9.0000 mL | INTRAVENOUS | Status: DC | PRN

## 2016-09-10 MED ORDER — POTASSIUM CHLORIDE ER 10 MEQ PO TBCR: 10.0000 meq | EXTENDED_RELEASE_TABLET | Freq: Every day | ORAL | Status: DC

## 2016-09-10 MED ORDER — PROPOFOL 10 MG/ML IV BOLUS
INTRAVENOUS | Status: DC | PRN
  Administered 2016-09-10: 100 mg via INTRAVENOUS
  Administered 2016-09-10: 20 mg via INTRAVENOUS

## 2016-09-10 MED ORDER — EPHEDRINE SULFATE-NACL 50-0.9 MG/10ML-% IV SOSY
PREFILLED_SYRINGE | INTRAVENOUS | Status: DC | PRN
  Administered 2016-09-10 (×2): 15 mg via INTRAVENOUS
  Administered 2016-09-10 (×2): 5 mg via INTRAVENOUS
  Administered 2016-09-10: 10 mg via INTRAVENOUS

## 2016-09-10 MED ORDER — MIDAZOLAM HCL 2 MG/2ML IJ SOLN
INTRAMUSCULAR | Status: AC
  Filled 2016-09-10: qty 2

## 2016-09-10 MED ORDER — FENTANYL CITRATE (PF) 250 MCG/5ML IJ SOLN
INTRAMUSCULAR | Status: DC | PRN
  Administered 2016-09-10 (×7): 50 ug via INTRAVENOUS

## 2016-09-10 MED ORDER — ACETAMINOPHEN 325 MG PO TABS: 325.0000 mg | ORAL_TABLET | ORAL | Status: DC | PRN

## 2016-09-10 MED ORDER — ALBUTEROL SULFATE HFA 108 (90 BASE) MCG/ACT IN AERS: 2.0000 | INHALATION_SPRAY | Freq: Four times a day (QID) | RESPIRATORY_TRACT | Status: DC | PRN

## 2016-09-10 MED ORDER — POTASSIUM CHLORIDE CRYS ER 20 MEQ PO TBCR: 20.0000 meq | EXTENDED_RELEASE_TABLET | Freq: Every day | ORAL | Status: DC | PRN

## 2016-09-10 MED ORDER — SUGAMMADEX SODIUM 200 MG/2ML IV SOLN
INTRAVENOUS | Status: DC | PRN
  Administered 2016-09-10: 200 mg via INTRAVENOUS

## 2016-09-10 SURGICAL SUPPLY — 69 items
AGENT HMST SPONGE THK3/8 (HEMOSTASIS) ×3
BANDAGE ACE 4X5 VEL STRL LF (GAUZE/BANDAGES/DRESSINGS) IMPLANT
BANDAGE ESMARK 6X9 LF (GAUZE/BANDAGES/DRESSINGS) ×1 IMPLANT
BNDG CMPR 9X6 STRL LF SNTH (GAUZE/BANDAGES/DRESSINGS) ×3
BNDG ESMARK 6X9 LF (GAUZE/BANDAGES/DRESSINGS) ×4
BNDG GAUZE ELAST 4 BULKY (GAUZE/BANDAGES/DRESSINGS) ×4 IMPLANT
CANISTER SUCT 3000ML PPV (MISCELLANEOUS) ×4 IMPLANT
CANNULA VESSEL 3MM 2 BLNT TIP (CANNULA) ×2 IMPLANT
CLIP VESOCCLUDE MED 24/CT (CLIP) ×4 IMPLANT
CLIP VESOCCLUDE SM WIDE 24/CT (CLIP) ×4 IMPLANT
COVER PROBE W GEL 5X96 (DRAPES) ×2 IMPLANT
CUFF TOURNIQUET SINGLE 24IN (TOURNIQUET CUFF) IMPLANT
CUFF TOURNIQUET SINGLE 34IN LL (TOURNIQUET CUFF) ×2 IMPLANT
CUFF TOURNIQUET SINGLE 44IN (TOURNIQUET CUFF) IMPLANT
DRAIN CHANNEL 15F RND FF W/TCR (WOUND CARE) IMPLANT
DRAPE C-ARM 42X72 X-RAY (DRAPES) ×4 IMPLANT
ELECT REM PT RETURN 9FT ADLT (ELECTROSURGICAL) ×4
ELECTRODE REM PT RTRN 9FT ADLT (ELECTROSURGICAL) ×3 IMPLANT
EVACUATOR SILICONE 100CC (DRAIN) IMPLANT
GAUZE SPONGE 4X4 12PLY STRL (GAUZE/BANDAGES/DRESSINGS) ×4 IMPLANT
GLOVE BIO SURGEON STRL SZ 6.5 (GLOVE) ×2 IMPLANT
GLOVE BIO SURGEON STRL SZ7 (GLOVE) ×6 IMPLANT
GLOVE BIOGEL PI IND STRL 7.5 (GLOVE) ×3 IMPLANT
GLOVE BIOGEL PI INDICATOR 7.5 (GLOVE) ×1
GOWN STRL REUS W/ TWL LRG LVL3 (GOWN DISPOSABLE) ×8 IMPLANT
GOWN STRL REUS W/ TWL XL LVL3 (GOWN DISPOSABLE) ×1 IMPLANT
GOWN STRL REUS W/TWL LRG LVL3 (GOWN DISPOSABLE) ×8
GOWN STRL REUS W/TWL XL LVL3 (GOWN DISPOSABLE) ×4
HEMOSTAT SPONGE AVITENE ULTRA (HEMOSTASIS) ×2 IMPLANT
INSERT FOGARTY SM (MISCELLANEOUS) IMPLANT
KIT BASIN OR (CUSTOM PROCEDURE TRAY) ×4 IMPLANT
KIT ROOM TURNOVER OR (KITS) ×4 IMPLANT
KIT STIMULAN RAPID CURE  10CC (Orthopedic Implant) ×1 IMPLANT
KIT STIMULAN RAPID CURE 10CC (Orthopedic Implant) ×1 IMPLANT
MARKER GRAFT CORONARY BYPASS (MISCELLANEOUS) IMPLANT
NS IRRIG 1000ML POUR BTL (IV SOLUTION) ×8 IMPLANT
PACK PERIPHERAL VASCULAR (CUSTOM PROCEDURE TRAY) ×4 IMPLANT
PAD ABD 8X10 STRL (GAUZE/BANDAGES/DRESSINGS) ×4 IMPLANT
PAD ARMBOARD 7.5X6 YLW CONV (MISCELLANEOUS) ×8 IMPLANT
PATCH VASC XENOSURE 1CMX6CM (Vascular Products) ×8 IMPLANT
PATCH VASC XENOSURE 1X6 (Vascular Products) ×2 IMPLANT
PULSAVAC PLUS IRRIG FAN TIP (DISPOSABLE) ×4
SPONGE LAP 18X18 X RAY DECT (DISPOSABLE) ×2 IMPLANT
STAPLER VISISTAT 35W (STAPLE) IMPLANT
STOPCOCK 4 WAY LG BORE MALE ST (IV SETS) ×4 IMPLANT
SUT ETHILON 3 0 PS 1 (SUTURE) IMPLANT
SUT GORETEX 5 0 TT13 24 (SUTURE) IMPLANT
SUT GORETEX 6.0 TT13 (SUTURE) IMPLANT
SUT MNCRL AB 4-0 PS2 18 (SUTURE) ×10 IMPLANT
SUT PROLENE 5 0 C 1 24 (SUTURE) ×8 IMPLANT
SUT PROLENE 6 0 BV (SUTURE) ×4 IMPLANT
SUT PROLENE 7 0 BV 1 (SUTURE) IMPLANT
SUT SILK 2 0 FS (SUTURE) ×2 IMPLANT
SUT SILK 3 0 (SUTURE)
SUT SILK 3-0 18XBRD TIE 12 (SUTURE) IMPLANT
SUT VIC AB 2-0 CT1 27 (SUTURE) ×16
SUT VIC AB 2-0 CT1 TAPERPNT 27 (SUTURE) ×8 IMPLANT
SUT VIC AB 3-0 SH 27 (SUTURE) ×16
SUT VIC AB 3-0 SH 27X BRD (SUTURE) ×10 IMPLANT
SWAB CULTURE ESWAB REG 1ML (MISCELLANEOUS) ×4 IMPLANT
SYR 3ML LL SCALE MARK (SYRINGE) ×2 IMPLANT
TAPE CLOTH SURG 4X10 WHT LF (GAUZE/BANDAGES/DRESSINGS) ×4 IMPLANT
TIP FAN IRRIG PULSAVAC PLUS (DISPOSABLE) ×1 IMPLANT
TRAY FOLEY W/METER SILVER 16FR (SET/KITS/TRAYS/PACK) ×4 IMPLANT
TUBE ANAEROBIC SPECIMEN COL (MISCELLANEOUS) ×4 IMPLANT
TUBING EXTENTION W/L.L. (IV SETS) ×4 IMPLANT
UNDERPAD 30X30 (UNDERPADS AND DIAPERS) ×4 IMPLANT
WATER STERILE IRR 1000ML POUR (IV SOLUTION) ×4 IMPLANT
YANKAUER SUCT BULB TIP NO VENT (SUCTIONS) ×4 IMPLANT

## 2016-09-10 NOTE — Op Note (Addendum)
OPERATIVE NOTE   PROCEDURE: 1. Irrigation and drainage of left groin hematoma 2. Excision of left femoropopliteal graft 3. Bovine patch angioplasty left common femoral artery  4. Bovine patch angioplasty left below-the-knee popliteal artery  5. Thrombectomy of left anterior tibial artery   PRE-OPERATIVE DIAGNOSIS: likely proximal femoropopliteal anastomotic dehiscence  POST-OPERATIVE DIAGNOSIS: same as above   SURGEON: Leonides SakeBrian Chen, MD  ASSISTANT(S): Cari Carawayhris Dickson MD; Doreatha MassedSamantha Rhyne, [facility]   ANESTHESIA: general  ESTIMATED BLOOD LOSS: 50 cc  FINDING(S): 1.  60 cc of clot in left groin caused by frank rupture of lateral half of proximal femoropopliteal anastomosis 2.  No frank evidence of infection throughout 3.  Thrombosed femoropopliteal graft 4.  Ischemic appearing soleus muscle 5.  Ischemic appearing left foot at end of case 6.  No obvious doppler signals in foot  SPECIMEN(S):  Anaerobic and aerobic cultures of hematoma and graft  INDICATIONS:   Andrea [name] is a 66 y.o. female who presents with large left groin mass with obvious clot evident through skin breakdown s/p left femoropopliteal bypass.  Dr. Randie Heinzain recommended: exploration of left groin, possible excision of femoropopliteal graft, and repair of left common femoral artery.  The risk, benefits, and alternative for the operation were discussed with the patient.  The patient is aware the risks include but are not limited to: massive bleeding, infection, myocardial infarction, stroke, limb loss, nerve damage, limb edema, need for additional procedures in the future, and wound complications. The patient is aware of these risks and agreed to proceed.   DESCRIPTION: After obtaining full informed written consent, the patient was brought back to the operating room and placed supine upon the operating table.  The patient received IV antibiotics prior to induction.  A procedure time out was completed and the correct surgical  site was verified.  After obtaining adequate anesthesia, the patient was prepped and draped in the standard fashion for: left femoropopliteal exploration.  I made an incision over the prior incision.  Immediately frank thrombus was noted and evacuated.  There appeared to be cavity extending distally.  I open this cavity with electrocautery.  The graft was evident in this cavity.  There was no pulse in this graft.  I ligated the graft with a 2-0 silk as a precaution.  I then proceed with exploration of the left groin.  I took anaerobic and aerobic cultures of the hematoma in this groin.    I started opening the subcutaneous tissue on top of what was a large hematoma.  I evacuated the hematoma and immediately there was bleeding evident.  I compressed the left external iliac artery with a sponge stick.  This allowed me to determine that the proximal anastomosis was ruptured in the lateral suture line.  I advanced a 5 Fogarty into the left distal external iliac artery and inflated the balloon and pulled it into position to control bleeding.  The balloon was locked with a stopcock.  I then sharply took down the rest of the suture line with a 11-blade and nerve hook.  I removed the graft from the common femoral artery.  There was minimal backbleeding from the profunda femoral artery and none for the chronically occluded superficial femoral artery.  I passed a 4 Fogarty down the profunda femoral artery and pull the balloon to tension, thus occluding the profunda femoral artery.  With control of the bleeding, I gave 6000 units of Heparin which a therapeutic bolus.  I briefly explored the left groin but  no anatomic details could be easily discerned, so I elected to patch this left common femoral artery with a bovine pericardial patch.  I fashioned the patch for the geometry of this arteriotomy in the common femoral artery.  I sewed the patch onto the left common femoral artery with a running stitch of 6-0 Prolene.   Prior to completion, I passed the 4 Fogarty distally down the profunda femoral artery.  No thrombus was noted.  I then let the 5 Fogarty down proximally and there was excellent pulsatile bleeding.  Pressure was held to the distal left external iliac artery which I completed the patch angioplasty.    At this point, despite no frank evidence of infection of the graft, I felt that the assumption had to be that the graft was infected given the frank rupture of the proximal anastomosis.  I turned my attention to the calf.  I made an incision through the prior incision and dissected out the fascia.  I pulled the left gastrocneumius posteriorly and then dissected out the below-the-knee popliteal artery.  There was limited ability to dissect out the popliteal artery formally due to intense scarring obscuring anatomy.  I elected to place a tourniquet on the thigh.  I exsanguinated the left lower leg with a Esmark bandage.  I then inflated the tourniquet at 250 mm Hg pressure.  I sharply took down the distal femoropopliteal anastomosis with a 11-blade and nerve hook.  There was evidence of limited neointimal hyperplasia here.  I removed this intimal hyperplasia and then washed out the popliteal artery.  I passed a 3 Fogarty 20-30 cm distally into the presumed left anterior tibial artery and got some acute thrombus out.  I repeated this until I no longer got clot.   I fashioned a bovine pericardial patch for the geometry of this arteriotomy in the left below-the-knee popliteal artery.  I sewed the patch onto the artery with a running stitch of 6-0 Prolene.  Prior to completion, I dropped the tourniquet.  There was distal backbleeding.  I passed the 3 Fogarty proximally, encountering a hard occlusion 20 cm proximally.  I completed this patch angioplasty at this point.  At this point, the soleus muscle appeared to be ischemic as did the left foot.  I could get no distal signals.  At this point the left groin was washed out  with a Pulsvac and 3 L of sterile normal saline.  Absorbable antibiotic beads were made per manufacturer instructions using Vancomycin and Gentamicin.  I allowed these to set.  Meanwhile, I dissected the femoropopliteal graft out from the groin and calf.  The graft appeared to be densely adherent in the central segment, so I had to make a counter incision in the mid-segment thigh.  I dissected out to the graft with electrocautery, going between thigh muscles.  Eventually, I was able to fully mobilized the femoropopliteal graft and pulled the graft out, sending it for anaerobic and aerobic cultures.  At this point, all incisions were washed out and bleeding controlled with suture ligation and electrocautery.  Once active bleeding was controlled.  I placed the previously made antibiotic beads into the bed of the previous graft at each incision.  I reapproximated the subcutaneous tissue in the left groin with big mattress sutures of 2-0 Vicryl.  This was able to close the deep layer of most of the left groin, but the proximal segment a defect of tissue and the residual tissue was too inflamed to hold any tension.  The artery still was mostly covered without antibiotic beads evident.  I packed this wound with a wet-to-dry dressing of Kerlix and normal saline.  I then turned my attention to the mid-thigh exposure.  I reapproximated the fascia over the antibiotic beads with a running stitch of 3-0 Vicryl.  I packed the superficial wound with a wet-to-dry dressing of Kerlix and normal saline.  Finally, I turned my attention the calf.  I reapproximated the fascia over the popliteal space with a running stitch of 3-0 Vicryl.  I packed the superficial wound with a wet-to-dry dressing of Kerlix and normal saline.  At this point, I could not get any distal doppler signals and the left foot looked ischemic.   COMPLICATIONS: none  CONDITION: stable   Leonides Sake, MD, [facility]  [place] Office: [phone] Pager: [phone]  09/10/2016, 6:32 PM

## 2016-09-10 NOTE — Progress Notes (Signed)
Pharmacy Antibiotic Note  Andrea [name] is a 66 y.o. 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  Pharmacy has been consulted for vancomycin and Zosyn dosing. SCr is elevated almost 3x baseline from July.   Plan: Vancomycin 1250 mg IV x 1 for loading dose, followed by  Vancomycin 500 mg IV every 24 hours.  Goal trough 15-20 mcg/mL. Zosyn 3.375g IV q8h (4 hour infusion) Monitor clinical progress, cultures/sensitivities, renal function, abx plan Vancomycin trough as indicated    Temp (24hrs), Avg:98.6 F (37 C), Min:97 F (36.1 C), Max:101.3 F (38.5 C)   Recent Labs Lab 09/10/16 1450 09/10/16 1502  WBC 9.1  --   CREATININE  --  1.51*    Estimated Creatinine Clearance: 30.2 mL/min (A) (by C-G formula based on SCr of 1.51 mg/dL (H)).    Allergies  Allergen Reactions  . Naproxen Itching  . Tramadol Hcl Itching    Antimicrobials this admission: 8/30 cefuroxime >> x1 intra-op 8/30 vanc >>  8/30 Zosyn >>  Dose adjustments this admission:  Microbiology results: 8/30 wound >> pending   Thank you for allowing us to participate in this patients care.  Signe Coltonya C Haniel Fix, PharmD Clinical phone for 09/10/2016 from 3:30-10:30p: [phone] If after 10:30p, please call main pharmacy at: [phone] 09/10/2016 8:25 PM

## 2016-09-10 NOTE — Interval H&P Note (Signed)
   History and Physical Update  The patient was interviewed and re-examined.  The patient's previous History and Physical has been reviewed and is unchanged from Dr. Darcella Cheshireain's consult.  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   The risk, benefits, and alternative for the operation were discussed with the patient.    The patient is aware the risks include but are not limited to: massive bleeding, infection, myocardial infarction, stroke, limb loss, nerve damage, limb edema, need for additional procedures in the future, and wound complications.   The patient is aware of these risks and agreed to proceed. Leonides Sake.  Debara Kamphuis, MD, FACS [facility] [place] Office: [phone] Pager: [phone]  09/10/2016, 2:48 PM

## 2016-09-10 NOTE — Progress Notes (Signed)
Patient ID: Andrea [name], female   DOB: [birth year], 66 y.o.   MRN: [id number]  Reason for Consult: PAD (eval large inguinal hernia L groin )   Referred by Jaclyn Shaggy, MD  Subjective:     HPI:  Andrea [name] is a 66 y.o. female previous history of bilateral femoral popliteal artery bypasses with graft. The first one had a wound competition that had to be washed out but was not grossly infected. On the left side she has great toe necrosis which has been dry. Over the past 1 week she states that she has fallen a few times. She was recently seen by her primary care doctor and was thought to have a left inguinal hernia and was scheduled to be seen today. She states that she is maybe having fevers at home but is unsure. The swelling in her groin is getting progressive and she is having significant pain there and it did rupture this morning but she has not had significant blood loss. Overall she has also felt nauseated and has not eaten at all today. She does take aspirin and Plavix but no blood thinners.  Past Medical History:  Diagnosis Date  . Asthma   . Coronary artery disease   . Diabetes mellitus without complication (HCC)   . Dyspnea   . Fibromyalgia   . GERD (gastroesophageal reflux disease)   . High cholesterol   . Hypertension   . PONV (postoperative nausea and vomiting)   . PVD (peripheral vascular disease) (HCC)   . Urinary frequency   . Urinary incontinence    Family History  Problem Relation Age of Onset  . Stroke Mother    Past Surgical History:  Procedure Laterality Date  . ABDOMINAL AORTOGRAM W/LOWER EXTREMITY N/A 03/25/2016   Procedure: Abdominal Aortogram w/Lower Extremity;  Surgeon: Maeola Harman, MD;  Location: [facility] INVASIVE CV LAB;  Service: Cardiovascular;  Laterality: N/A;  . APPLICATION OF WOUND VAC Right 06/14/2016   Procedure: APPLICATION OF WOUND VAC;  Surgeon: Sherren Kerns, MD;  Location[facility] OR;  Service: Vascular;  Laterality: Right;    . CORONARY ARTERY BYPASS GRAFT    . CORONARY STENT INTERVENTION N/A 04/15/2016   Procedure: Coronary Stent Intervention;  Surgeon: Lennette Bihari, MD;  Location: [facility] INVASIVE CV LAB;  Service: Cardiovascular;  Laterality: N/A;  . ENDARTERECTOMY FEMORAL Right 05/21/2016   Procedure: RIGHT FEMORAL ENDARTERECTOMY;  Surgeon: Maeola Harman, MD;  Location: [facility] OR;  Service: Vascular;  Laterality: Right;  . ENDARTERECTOMY FEMORAL Left 07/20/2016   Procedure: LEFT FEMORAL ENDARTERECTOMY;  Surgeon: Maeola Harman, MD;  Location: [facility] OR;  Service: Vascular;  Laterality: Left;  . FEMORAL BYPASS Left 07/20/2016   Left common femoral endarterectomy  . FEMORAL-POPLITEAL BYPASS GRAFT Right 05/21/2016   Procedure: RIGHT FEMORAL-BELOW KNEE POPLITEAL ARTERY BYPASS GRAFT USING PROPATEN GORETEX GRAFT;  Surgeon: Maeola Harman, MD;  Location: [facility] OR;  Service: Vascular;  Laterality: Right;  . FEMORAL-POPLITEAL BYPASS GRAFT Left 07/20/2016   Procedure: LEFT FEMORAL TO BELOW KNEE POPLITEAL ARTERY  BYPASS GRAFT;  Surgeon: Maeola Harman, MD;  Location: [facility] OR;  Service: Vascular;  Laterality: Left;  . GROIN DEBRIDEMENT Right 06/14/2016   Procedure: GROIN DEBRIDEMENT;  Surgeon: Sherren Kerns, MD;  Location: [facility] OR;  Service: Vascular;  Laterality: Right;  . KNEE SURGERY    . LEFT HEART CATH AND CORS/GRAFTS ANGIOGRAPHY N/A 04/15/2016   Procedure: Left Heart Cath and Cors/Grafts Angiography;  Surgeon: Lennette Bihari, MD;  Location: [facility] INVASIVE CV LAB;  Service: Cardiovascular;  Laterality: N/A;  . MULTIPLE TOOTH EXTRACTIONS      Short Social History:  Social History  Substance Use Topics  . Smoking status: Former Smoker    Packs/day: 0.25    Types: Cigarettes    Quit date: 04/04/2016  . Smokeless tobacco: Never Used  . Alcohol use No    Allergies  Allergen Reactions  . Naproxen Itching  . Tramadol Hcl Itching    Current Outpatient Prescriptions  Medication Sig Dispense  Refill  . acetaminophen-codeine (TYLENOL #3) 300-30 MG tablet Take 1 tablet by mouth every 8 (eight) hours as needed for moderate pain. 60 tablet 0  . albuterol (PROVENTIL HFA;VENTOLIN HFA) 108 (90 Base) MCG/ACT inhaler Inhale 2 puffs into the lungs every 6 (six) hours as needed for wheezing or shortness of breath. 54 Inhaler 3  . aspirin EC 81 MG tablet Take 1 tablet (81 mg total) by mouth daily. 90 tablet 3  . atorvastatin (LIPITOR) 80 MG tablet Take 1 tablet (80 mg total) by mouth daily. (Patient taking differently: Take 80 mg by mouth at bedtime. ) 90 tablet 3  . carbamide peroxide (DEBROX) 6.5 % otic solution Place 5 drops into both ears daily as needed (wax buildup).    . clopidogrel (PLAVIX) 75 MG tablet Take 1 tablet (75 mg total) by mouth daily with breakfast. 90 tablet 3  . cyclobenzaprine (FLEXERIL) 5 MG tablet Take 5 mg by mouth every 6 (six) hours as needed (pain/muscle spams.).   0  . DULoxetine (CYMBALTA) 60 MG capsule Take 1 capsule (60 mg total) by mouth daily. 60 capsule 1  . Fluticasone-Salmeterol (ADVAIR) 100-50 MCG/DOSE AEPB Inhale 1 puff into the lungs 2 (two) times daily. 180 each 3  . furosemide (LASIX) 20 MG tablet Take 1 tablet (20 mg total) by mouth daily. 30 tablet 1  . hydrochlorothiazide (HYDRODIURIL) 25 MG tablet Take 25 mg by mouth daily.  0  . HYDROcodone-acetaminophen (NORCO/VICODIN) 5-325 MG tablet Take 1 tablet by mouth every 6 (six) hours as needed for moderate pain. 30 tablet 0  . ibuprofen (ADVIL,MOTRIN) 200 MG tablet Take 400 mg by mouth every 6 (six) hours as needed.    . Insulin Glargine (LANTUS SOLOSTAR) 100 UNIT/ML Solostar Pen Inject 30 Units into the skin at bedtime. 5 pen 5  . lisinopril (PRINIVIL,ZESTRIL) 5 MG tablet Take 1 tablet (5 mg total) by mouth daily. 30 tablet 5  . metFORMIN (GLUCOPHAGE) 1000 MG tablet Take 1 tablet (1,000 mg total) by mouth 2 (two) times daily with a meal. Restart 48 hrs after discharge. 60 tablet 3  . metoprolol tartrate  (LOPRESSOR) 25 MG tablet Take 0.5 tablets (12.5 mg total) by mouth 2 (two) times daily. 90 tablet 3  . oxybutynin (DITROPAN) 5 MG tablet TAKE 1 TABLET BY MOUTH 2 TIMES DAILY. 60 tablet 2  . pantoprazole (PROTONIX) 20 MG tablet Take 1 tablet (20 mg total) by mouth daily. 30 tablet 1  . potassium chloride (K-DUR) 10 MEQ tablet Take 1 tablet (10 mEq total) by mouth daily. 30 tablet 1  . pregabalin (LYRICA) 100 MG capsule Take 1 capsule (100 mg total) by mouth 2 (two) times daily. 60 capsule 3  . promethazine (PHENERGAN) 25 MG tablet Take 12.5-25 mg by mouth every 6 (six) hours as needed for nausea or vomiting.    . sulfamethoxazole-trimethoprim (BACTRIM DS,SEPTRA DS) 800-160 MG tablet Take 1 tablet by mouth 2 (two) times daily. 20 tablet 0  No current facility-administered medications for this visit.     Review of Systems  Constitutional: Positive for chills, fatigue and fever.  GI: Positive for nausea.  Skin: Positive for wound.        Objective:  Objective   Vitals:   09/10/16 1243  BP: (!) 89/57  Pulse: 90  Resp: 18  Temp: (!) 101.3 F (38.5 C)  TempSrc: Oral  SpO2: 95%  Weight: 126 lb 1.6 oz (57.2 kg)  Height: 5\' 2"  (1.575 m)   Body mass index is 23.06 kg/m.  Physical Exam  Constitutional: She appears well-developed.  Cardiovascular: Normal rate.   Strong left pt signal  Pulmonary/Chest: Effort normal.  Abdominal: Soft.  Musculoskeletal:  Right groin has healed Left with eccymoses, 1cm area of opening with exposed clot Below knee incision on left has healed         Assessment/Plan:     66 year old female presents with acute swelling in her left groin following femoral-popliteal artery bypass with graft. This is extremely concerning for acute dehiscence of her femoral anastomosis. We will get EMS transport from our office directly to the operating room where Dr. Imogene Burn will take her to the operating room today for washout left groin probable graft excision. She did  have rest pain prior to this and is likely to return to that state which we can address at a later date. This is been explained to her and we will get her to the hospital urgently.     Maeola Harman MD [facility]

## 2016-09-10 NOTE — H&P (View-Only) (Signed)
Patient ID: Andrea [name], female   DOB: [birth year], 66 y.o.   MRN: [id number]  Reason for Consult: PAD (eval large inguinal hernia L groin )   Referred by Jaclyn Shaggy, MD  Subjective:     HPI:  Andrea [name] is a 66 y.o. female previous history of bilateral femoral popliteal artery bypasses with graft. The first one had a wound competition that had to be washed out but was not grossly infected. On the left side she has great toe necrosis which has been dry. Over the past 1 week she states that she has fallen a few times. She was recently seen by her primary care doctor and was thought to have a left inguinal hernia and was scheduled to be seen today. She states that she is maybe having fevers at home but is unsure. The swelling in her groin is getting progressive and she is having significant pain there and it did rupture this morning but she has not had significant blood loss. Overall she has also felt nauseated and has not eaten at all today. She does take aspirin and Plavix but no blood thinners.  Past Medical History:  Diagnosis Date  . Asthma   . Coronary artery disease   . Diabetes mellitus without complication (HCC)   . Dyspnea   . Fibromyalgia   . GERD (gastroesophageal reflux disease)   . High cholesterol   . Hypertension   . PONV (postoperative nausea and vomiting)   . PVD (peripheral vascular disease) (HCC)   . Urinary frequency   . Urinary incontinence    Family History  Problem Relation Age of Onset  . Stroke Mother    Past Surgical History:  Procedure Laterality Date  . ABDOMINAL AORTOGRAM W/LOWER EXTREMITY N/A 03/25/2016   Procedure: Abdominal Aortogram w/Lower Extremity;  Surgeon: Maeola Harman, MD;  Location: [facility] INVASIVE CV LAB;  Service: Cardiovascular;  Laterality: N/A;  . APPLICATION OF WOUND VAC Right 06/14/2016   Procedure: APPLICATION OF WOUND VAC;  Surgeon: Sherren Kerns, MD;  Location: [facility] OR;  Service: Vascular;  Laterality: Right;    . CORONARY ARTERY BYPASS GRAFT    . CORONARY STENT INTERVENTION N/A 04/15/2016   Procedure: Coronary Stent Intervention;  Surgeon: Lennette Bihari, MD;  Location: [facility] INVASIVE CV LAB;  Service: Cardiovascular;  Laterality: N/A;  . ENDARTERECTOMY FEMORAL Right 05/21/2016   Procedure: RIGHT FEMORAL ENDARTERECTOMY;  Surgeon: Maeola Harman, MD;  Location: [facility] OR;  Service: Vascular;  Laterality: Right;  . ENDARTERECTOMY FEMORAL Left 07/20/2016   Procedure: LEFT FEMORAL ENDARTERECTOMY;  Surgeon: Maeola Harman, MD;  Location: [facility] OR;  Service: Vascular;  Laterality: Left;  . FEMORAL BYPASS Left 07/20/2016   Left common femoral endarterectomy  . FEMORAL-POPLITEAL BYPASS GRAFT Right 05/21/2016   Procedure: RIGHT FEMORAL-BELOW KNEE POPLITEAL ARTERY BYPASS GRAFT USING PROPATEN GORETEX GRAFT;  Surgeon: Maeola Harman, MD;  Location: [facility] OR;  Service: Vascular;  Laterality: Right;  . FEMORAL-POPLITEAL BYPASS GRAFT Left 07/20/2016   Procedure: LEFT FEMORAL TO BELOW KNEE POPLITEAL ARTERY  BYPASS GRAFT;  Surgeon: Maeola Harman, MD;  Location: [facility] OR;  Service: Vascular;  Laterality: Left;  . GROIN DEBRIDEMENT Right 06/14/2016   Procedure: GROIN DEBRIDEMENT;  Surgeon: Sherren Kerns, MD;  Location: [facility] OR;  Service: Vascular;  Laterality: Right;  . KNEE SURGERY    . LEFT HEART CATH AND CORS/GRAFTS ANGIOGRAPHY N/A 04/15/2016   Procedure: Left Heart Cath and Cors/Grafts Angiography;  Surgeon: Lennette Bihari, MD;  Location: [facility] INVASIVE CV LAB;  Service: Cardiovascular;  Laterality: N/A;  . MULTIPLE TOOTH EXTRACTIONS      Short Social History:  Social History  Substance Use Topics  . Smoking status: Former Smoker    Packs/day: 0.25    Types: Cigarettes    Quit date: 04/04/2016  . Smokeless tobacco: Never Used  . Alcohol use No    Allergies  Allergen Reactions  . Naproxen Itching  . Tramadol Hcl Itching    Current Outpatient Prescriptions  Medication Sig Dispense  Refill  . acetaminophen-codeine (TYLENOL #3) 300-30 MG tablet Take 1 tablet by mouth every 8 (eight) hours as needed for moderate pain. 60 tablet 0  . albuterol (PROVENTIL HFA;VENTOLIN HFA) 108 (90 Base) MCG/ACT inhaler Inhale 2 puffs into the lungs every 6 (six) hours as needed for wheezing or shortness of breath. 54 Inhaler 3  . aspirin EC 81 MG tablet Take 1 tablet (81 mg total) by mouth daily. 90 tablet 3  . atorvastatin (LIPITOR) 80 MG tablet Take 1 tablet (80 mg total) by mouth daily. (Patient taking differently: Take 80 mg by mouth at bedtime. ) 90 tablet 3  . carbamide peroxide (DEBROX) 6.5 % otic solution Place 5 drops into both ears daily as needed (wax buildup).    . clopidogrel (PLAVIX) 75 MG tablet Take 1 tablet (75 mg total) by mouth daily with breakfast. 90 tablet 3  . cyclobenzaprine (FLEXERIL) 5 MG tablet Take 5 mg by mouth every 6 (six) hours as needed (pain/muscle spams.).   0  . DULoxetine (CYMBALTA) 60 MG capsule Take 1 capsule (60 mg total) by mouth daily. 60 capsule 1  . Fluticasone-Salmeterol (ADVAIR) 100-50 MCG/DOSE AEPB Inhale 1 puff into the lungs 2 (two) times daily. 180 each 3  . furosemide (LASIX) 20 MG tablet Take 1 tablet (20 mg total) by mouth daily. 30 tablet 1  . hydrochlorothiazide (HYDRODIURIL) 25 MG tablet Take 25 mg by mouth daily.  0  . HYDROcodone-acetaminophen (NORCO/VICODIN) 5-325 MG tablet Take 1 tablet by mouth every 6 (six) hours as needed for moderate pain. 30 tablet 0  . ibuprofen (ADVIL,MOTRIN) 200 MG tablet Take 400 mg by mouth every 6 (six) hours as needed.    . Insulin Glargine (LANTUS SOLOSTAR) 100 UNIT/ML Solostar Pen Inject 30 Units into the skin at bedtime. 5 pen 5  . lisinopril (PRINIVIL,ZESTRIL) 5 MG tablet Take 1 tablet (5 mg total) by mouth daily. 30 tablet 5  . metFORMIN (GLUCOPHAGE) 1000 MG tablet Take 1 tablet (1,000 mg total) by mouth 2 (two) times daily with a meal. Restart 48 hrs after discharge. 60 tablet 3  . metoprolol tartrate  (LOPRESSOR) 25 MG tablet Take 0.5 tablets (12.5 mg total) by mouth 2 (two) times daily. 90 tablet 3  . oxybutynin (DITROPAN) 5 MG tablet TAKE 1 TABLET BY MOUTH 2 TIMES DAILY. 60 tablet 2  . pantoprazole (PROTONIX) 20 MG tablet Take 1 tablet (20 mg total) by mouth daily. 30 tablet 1  . potassium chloride (K-DUR) 10 MEQ tablet Take 1 tablet (10 mEq total) by mouth daily. 30 tablet 1  . pregabalin (LYRICA) 100 MG capsule Take 1 capsule (100 mg total) by mouth 2 (two) times daily. 60 capsule 3  . promethazine (PHENERGAN) 25 MG tablet Take 12.5-25 mg by mouth every 6 (six) hours as needed for nausea or vomiting.    . sulfamethoxazole-trimethoprim (BACTRIM DS,SEPTRA DS) 800-160 MG tablet Take 1 tablet by mouth 2 (two) times daily. 20 tablet 0  No current facility-administered medications for this visit.     Review of Systems  Constitutional: Positive for chills, fatigue and fever.  GI: Positive for nausea.  Skin: Positive for wound.        Objective:  Objective   Vitals:   09/10/16 1243  BP: (!) 89/57  Pulse: 90  Resp: 18  Temp: (!) 101.3 F (38.5 C)  TempSrc: Oral  SpO2: 95%  Weight: 126 lb 1.6 oz (57.2 kg)  Height: 5\' 2"  (1.575 m)   Body mass index is 23.06 kg/m.  Physical Exam  Constitutional: She appears well-developed.  Cardiovascular: Normal rate.   Strong left pt signal  Pulmonary/Chest: Effort normal.  Abdominal: Soft.  Musculoskeletal:  Right groin has healed Left with eccymoses, 1cm area of opening with exposed clot Below knee incision on left has healed         Assessment/Plan:     66 year old female presents with acute swelling in her left groin following femoral-popliteal artery bypass with graft. This is extremely concerning for acute dehiscence of her femoral anastomosis. We will get EMS transport from our office directly to the operating room where Dr. Imogene Burn will take her to the operating room today for washout left groin probable graft excision. She did  have rest pain prior to this and is likely to return to that state which we can address at a later date. This is been explained to her and we will get her to the hospital urgently.     Maeola Harman MD [facility]

## 2016-09-10 NOTE — Progress Notes (Signed)
Pt having c/o of pain at incision site rating 10/10 with no relief from PRN pain medication. Dr. Imogene Burnhen notified. Verbal order given to administer Full dose Dilaudid PCA pump. Will continue to monitor. Gregor HamsAlisha Aliscia Clayton, RN

## 2016-09-10 NOTE — Anesthesia Procedure Notes (Signed)
Procedure Name: Intubation Date/Time: 09/10/2016 3:26 PM Performed by: Teressa Lower Pre-anesthesia Checklist: Patient identified, Emergency Drugs available, Suction available and Patient being monitored Patient Re-evaluated:Patient Re-evaluated prior to induction Oxygen Delivery Method: Circle system utilized Preoxygenation: Pre-oxygenation with 100% oxygen Induction Type: IV induction Ventilation: Mask ventilation without difficulty Laryngoscope Size: Mac and 3 Grade View: Grade I Tube type: Oral Tube size: 7.0 mm Number of attempts: 1 Airway Equipment and Method: Stylet and Oral airway Placement Confirmation: ETT inserted through vocal cords under direct vision,  positive ETCO2 and breath sounds checked- equal and bilateral Secured at: 21 cm Tube secured with: Tape Dental Injury: Teeth and Oropharynx as per pre-operative assessment

## 2016-09-10 NOTE — Transfer of Care (Signed)
Immediate Anesthesia Transfer of Care Note  Patient: Andrea [name]  Procedure(s) Performed: Procedure(s): Excision of Left Femoral-Popliteal Graft, Placement of Antibiotic Beads Left Leg (Left) Bovine PATCH ANGIOPLASTY Left Femoral Artery and Below Knee Popliteal Artery (Left)  Patient Location: PACU  Anesthesia Type:General  Level of Consciousness: awake, alert  and oriented  Airway & Oxygen Therapy: Patient Spontanous Breathing and Patient connected to face mask oxygen  Post-op Assessment: Report given to RN and Post -op Vital signs reviewed and stable  Post vital signs: Reviewed and stable  Last Vitals:  Vitals:   09/10/16 1436  BP: (!) 106/54  Pulse: 72  Resp: 18  Temp: 37.1 C  SpO2: 100%    Last Pain:  Vitals:   09/10/16 1436  TempSrc: Oral         Complications: No apparent anesthesia complications

## 2016-09-10 NOTE — Anesthesia Preprocedure Evaluation (Signed)
Anesthesia Evaluation  Patient identified by MRN, date of birth, ID band Patient awake    Reviewed: Allergy & Precautions, NPO status , Patient's Chart, lab work & pertinent test results, reviewed documented beta blocker date and time   History of Anesthesia Complications (+) PONV and history of anesthetic complications  Airway Mallampati: I  TM Distance: >3 FB Neck ROM: Full    Dental  (+) Upper Dentures, Partial Lower   Pulmonary shortness of breath, asthma , former smoker,    breath sounds clear to auscultation       Cardiovascular hypertension, Pt. on medications and Pt. on home beta blockers (-) angina+ CAD, + Cardiac Stents, + CABG and + Peripheral Vascular Disease   Rhythm:Regular     Neuro/Psych PSYCHIATRIC DISORDERS  Neuromuscular disease    GI/Hepatic Neg liver ROS, GERD  Medicated and Controlled,  Endo/Other  diabetes, Type 2, Insulin Dependent  Renal/GU negative Renal ROS     Musculoskeletal  (+) Fibromyalgia -  Abdominal   Peds  Hematology negative hematology ROS (+)   Anesthesia Other Findings   Reproductive/Obstetrics                             Anesthesia Physical Anesthesia Plan  ASA: III and emergent  Anesthesia Plan: General   Post-op Pain Management:    Induction: Intravenous  PONV Risk Score and Plan: 3 and Ondansetron and Treatment may vary due to age or medical condition  Airway Management Planned: Oral ETT  Additional Equipment: None  Intra-op Plan:   Post-operative Plan: Extubation in OR and Possible Post-op intubation/ventilation  Informed Consent: I have reviewed the patients History and Physical, chart, labs and discussed the procedure including the risks, benefits and alternatives for the proposed anesthesia with the patient or authorized representative who has indicated his/her understanding and acceptance.   Dental advisory given  Plan  Discussed with: CRNA and Surgeon  Anesthesia Plan Comments:         Anesthesia Quick Evaluation

## 2016-09-10 NOTE — Progress Notes (Signed)
New Admission Note:   Arrival Method: From PACU via Bed Mental Orientation: A&Ox4  Assessment: Completed Skin: Left leg surgical incision IV: L hand, L AC Pain: denies any pain Tubes: None Safety Measures: Safety Fall Prevention Plan has been discussed  Admission [facility] Orientation: Patient has been orientated to the room, unit and staff Family: 2 family members present at bedside  Orders to be reviewed and implemented. Will continue to monitor the patient. Call light has been placed within reach and bed alarm has been activated. Tele applied. CCMD notified    Gregor HamsAlisha Jobany Montellano, RN

## 2016-09-11 ENCOUNTER — Encounter (HOSPITAL_COMMUNITY): Payer: Self-pay | Admitting: Vascular Surgery

## 2016-09-11 LAB — BASIC METABOLIC PANEL
Anion gap: 7 (ref 5–15)
BUN: 36 mg/dL — AB (ref 6–20)
CHLORIDE: 106 mmol/L (ref 101–111)
CO2: 25 mmol/L (ref 22–32)
Calcium: 8.6 mg/dL — ABNORMAL LOW (ref 8.9–10.3)
Creatinine, Ser: 1.04 mg/dL — ABNORMAL HIGH (ref 0.44–1.00)
GFR calc Af Amer: >60 mL/min (ref >60)
GFR calc non Af Amer: 56 mL/min — ABNORMAL LOW (ref >60)
GLUCOSE: 263 mg/dL — AB (ref 65–99)
POTASSIUM: 5.4 mmol/L — AB (ref 3.5–5.1)
SODIUM: 138 mmol/L (ref 135–145)

## 2016-09-11 LAB — CBC
HCT: 21.9 % — ABNORMAL LOW (ref 36.0–46.0)
HEMOGLOBIN: 7.2 g/dL — AB (ref 12.0–15.0)
MCH: 29 pg (ref 26.0–34.0)
MCHC: 32.9 g/dL (ref 30.0–36.0)
MCV: 88.3 fL (ref 78.0–100.0)
Platelets: 247 10*3/uL (ref 150–400)
RBC: 2.48 MIL/uL — AB (ref 3.87–5.11)
RDW: 14.8 % (ref 11.5–15.5)
WBC: 8.3 10*3/uL (ref 4.0–10.5)

## 2016-09-11 LAB — GLUCOSE, CAPILLARY
GLUCOSE-CAPILLARY: 266 mg/dL — AB (ref 65–99)
GLUCOSE-CAPILLARY: 229 mg/dL — AB (ref 65–99)
Glucose-Capillary: 180 mg/dL — ABNORMAL HIGH (ref 65–99)
Glucose-Capillary: 174 mg/dL — ABNORMAL HIGH (ref 65–99)
Glucose-Capillary: 153 mg/dL — ABNORMAL HIGH (ref 65–99)

## 2016-09-11 LAB — PREPARE RBC (CROSSMATCH)

## 2016-09-11 MED ORDER — SODIUM CHLORIDE 0.9% FLUSH: 9.0000 mL | INTRAVENOUS | Status: DC | PRN

## 2016-09-11 MED ORDER — SODIUM CHLORIDE 0.9% FLUSH
9.0000 mL | INTRAVENOUS | Status: DC | PRN
Start: 2016-09-11 — End: 2016-09-12

## 2016-09-11 MED ORDER — NALOXONE HCL 0.4 MG/ML IJ SOLN: 0.4000 mg | INTRAMUSCULAR | Status: DC | PRN

## 2016-09-11 MED ORDER — FENTANYL CITRATE (PF) 100 MCG/2ML IJ SOLN: 25.0000 ug | INTRAMUSCULAR | Status: DC | PRN

## 2016-09-11 MED ORDER — SODIUM CHLORIDE 0.9 % IV SOLN: Freq: Once | INTRAVENOUS | Status: DC

## 2016-09-11 MED ORDER — HYDROMORPHONE 1 MG/ML IV SOLN
INTRAVENOUS | Status: DC
  Administered 2016-09-11: 1.5 mg via INTRAVENOUS
  Administered 2016-09-11: 0.3 mg via INTRAVENOUS
  Administered 2016-09-12: 0 mg via INTRAVENOUS
  Administered 2016-09-12: 0.6 mg via INTRAVENOUS

## 2016-09-11 MED ORDER — DEXTROSE 5 % IV SOLN
1.5000 g | INTRAVENOUS | Status: AC
  Filled 2016-09-11 (×2): qty 1.5

## 2016-09-11 MED ORDER — ONDANSETRON HCL 4 MG/2ML IJ SOLN: 4.0000 mg | Freq: Once | INTRAMUSCULAR | Status: DC | PRN

## 2016-09-11 MED ORDER — DIPHENHYDRAMINE HCL 12.5 MG/5ML PO ELIX: 12.5000 mg | ORAL_SOLUTION | Freq: Four times a day (QID) | ORAL | Status: DC | PRN

## 2016-09-11 MED ORDER — HYDROMORPHONE 1 MG/ML IV SOLN: INTRAVENOUS | Status: DC

## 2016-09-11 MED ORDER — DIPHENHYDRAMINE HCL 50 MG/ML IJ SOLN: 12.5000 mg | Freq: Four times a day (QID) | INTRAMUSCULAR | Status: DC | PRN

## 2016-09-11 MED ORDER — SODIUM CHLORIDE 0.9 % IV SOLN
500.0000 mg | Freq: Two times a day (BID) | INTRAVENOUS | Status: DC
  Administered 2016-09-11 – 2016-09-12 (×3): 500 mg via INTRAVENOUS
  Filled 2016-09-11 (×4): qty 500

## 2016-09-11 MED ORDER — ONDANSETRON HCL 4 MG/2ML IJ SOLN: 4.0000 mg | Freq: Four times a day (QID) | INTRAMUSCULAR | Status: DC | PRN

## 2016-09-11 MED ORDER — DIPHENHYDRAMINE HCL 12.5 MG/5ML PO ELIX
12.5000 mg | ORAL_SOLUTION | Freq: Four times a day (QID) | ORAL | Status: DC | PRN
  Filled 2016-09-11: qty 5

## 2016-09-11 NOTE — Hospital Discharge Follow-Up (Signed)
[facility]. Will schedule follow up when the discharge plan is determined.  Possible SNF placement.  Will follow hospital course

## 2016-09-11 NOTE — Progress Notes (Signed)
Patient on schedule for surgery again tomorrow 09/12/16.  Will leave Foley catheter in place in anticipation of surgical procedure.

## 2016-09-11 NOTE — Progress Notes (Signed)
Pt's respirations and BP (RR 7-9, BP 80's/40's) were noted to be low after she dosed herself with full dose PCA dilaudid.  MD was notified and order received to change pt to reduced dose dilaudid PCA.  Dosing change was made.  Will continue to monitor.

## 2016-09-11 NOTE — Evaluation (Signed)
Physical Therapy Evaluation Patient Details Name: Andrea [name] B Whisonant MRN: [id number] DOB: 1950/11/23 Today's Date: 09/11/2016   History of Present Illness  66 year old female s/p redo Lt fempop BPG with I&D 8/30. PMHx: Lt fem-below knee popliteal artery BPG and Lt fem endarterectomy  07/20/16, CABG, fibromyalgia, urinary incontinence, right fem-below knee popliteal artery BPG with prior infection and debridement  Clinical Impression  Pt pleasant and familiar from prior admission. Pt eager to mobilize despite nausea at times during session with period of emesis and nursing aware. Pt moving well with limited discomfort and dressings intact end of session. Pt desires return to independent function but has a 2 story home with limited assist at D/C this time and will most likely require SNF at DC. Pt with decreased strength, activity tolerance, balance and function who will benefit from acute therapy to maximize mobility, independence and balance to decrease fall risk and burden of care.     Follow Up Recommendations SNF;Supervision for mobility/OOB    Equipment Recommendations  None recommended by PT    Recommendations for Other Services OT consult     Precautions / Restrictions Precautions Precautions: Fall Precaution Comments: pt reports several recent falls      Mobility  Bed Mobility Overal bed mobility: Modified Independent             General bed mobility comments: HOB 30 degrees, use of rail  and increased time  Transfers Overall transfer level: Needs assistance   Transfers: Sit to/from Stand Sit to Stand: Min guard         General transfer comment: cues for hand placement and safety  Ambulation/Gait Ambulation/Gait assistance: Min guard Ambulation Distance (Feet): 110 Feet Assistive device: Rolling walker (2 wheeled) Gait Pattern/deviations: Step-through pattern;Decreased stride length;Trunk flexed   Gait velocity interpretation: Below normal speed for  age/gender General Gait Details: cues for posture, position in RW and steering to avoid obstacles  Stairs            Wheelchair Mobility    Modified Rankin (Stroke Patients Only)       Balance Overall balance assessment: Needs assistance;History of Falls   Sitting balance-Leahy Scale: Good       Standing balance-Leahy Scale: Fair                               Pertinent Vitals/Pain Pain Assessment: No/denies pain    Home Living Family/patient expects to be discharged to:: Private residence Living Arrangements: Alone Available Help at Discharge: Family;Available PRN/intermittently Type of Home: Apartment Home Access: Stairs to enter   Entrance Stairs-Number of Steps: 1 Home Layout: Two level;Bed/bath upstairs;1/2 bath on main level Home Equipment: Walker - 4 wheels;Other (comment);Bedside commode;Tub bench (rollator on each floor) Additional Comments: sister and daughter work and can assist intermittently    Prior Function Level of Independence: Independent with assistive device(s)               Hand Dominance        Extremity/Trunk Assessment   Upper Extremity Assessment Upper Extremity Assessment: Overall WFL for tasks assessed    Lower Extremity Assessment Lower Extremity Assessment: Generalized weakness    Cervical / Trunk Assessment Cervical / Trunk Assessment: Normal;Other exceptions Cervical / Trunk Exceptions: forward head  Communication   Communication: HOH  Cognition Arousal/Alertness: Awake/alert Behavior During Therapy: WFL for tasks assessed/performed Overall Cognitive Status: Impaired/Different from baseline Area of Impairment: Memory;Attention  Current Attention Level: Selective Memory: Decreased short-term memory                General Comments      Exercises     Assessment/Plan    PT Assessment Patient needs continued PT services  PT Problem List Decreased  strength;Decreased mobility;Decreased activity tolerance;Decreased balance;Decreased knowledge of use of DME;Decreased skin integrity       PT Treatment Interventions Gait training;Therapeutic exercise;Patient/family education;DME instruction;Therapeutic activities;Cognitive remediation;Stair training;Balance training;Functional mobility training    PT Goals (Current goals can be found in the Care Plan section)  Acute Rehab PT Goals Patient Stated Goal: be able to return home eventually PT Goal Formulation: With patient Time For Goal Achievement: 09/25/16 Potential to Achieve Goals: Fair    Frequency Min 3X/week   Barriers to discharge Decreased caregiver support      Co-evaluation               AM-PAC PT "6 Clicks" Daily Activity  Outcome Measure Difficulty turning over in bed (including adjusting bedclothes, sheets and blankets)?: A Little Difficulty moving from lying on back to sitting on the side of the bed? : Unable Difficulty sitting down on and standing up from a chair with arms (e.g., wheelchair, bedside commode, etc,.)?: A Little Help needed moving to and from a bed to chair (including a wheelchair)?: A Little Help needed walking in hospital room?: A Little Help needed climbing 3-5 steps with a railing? : A Lot 6 Click Score: 15    End of Session   Activity Tolerance: Patient tolerated treatment well Patient left: in chair;with chair alarm set;with call bell/phone within reach Nurse Communication: Mobility status PT Visit Diagnosis: Difficulty in walking, not elsewhere classified (R26.2);History of falling (Z91.81)    Time: 1610-9604 PT Time Calculation (min) (ACUTE ONLY): 36 min   Charges:   PT Evaluation $PT Eval Moderate Complexity: 1 Mod PT Treatments $Gait Training: 8-22 mins   PT G Codes:        Delaney Meigs, PT [phone]   Amaryah Mallen B Tinlee Navarrette 09/11/2016, 8:44 AM

## 2016-09-11 NOTE — Evaluation (Signed)
Occupational Therapy Evaluation Patient Details Name: Andrea [name] MRN: [id number] DOB: [birth year] Today's Date: 09/11/2016    History of Present Illness 66 year old female s/p redo Lt fempop BPG with I&D 8/30. PMHx: Lt fem-below knee popliteal artery BPG and Lt fem endarterectomy  07/20/16, CABG, fibromyalgia, urinary incontinence, right fem-below knee popliteal artery BPG with prior infection and debridement   Clinical Impression   This 66 yo female admitted and underwent above presents to acute OT with deficits below (see OT problem list) thus affecting her ability to independently care for herself. She will benefit from acute OT with follow up OT at SNF to work back towards an independent to Mod I level.    Follow Up Recommendations  SNF;Supervision/Assistance - 24 hour    Equipment Recommendations  None recommended by OT       Precautions / Restrictions Precautions Precautions: Fall Precaution Comments: pt reports several recent falls Restrictions Weight Bearing Restrictions: No      Mobility Bed Mobility               General bed mobility comments: Up in recliner upon arrival  Transfers Overall transfer level: Needs assistance Equipment used: Rolling walker (2 wheeled) Transfers: Sit to/from Stand Sit to Stand: Min assist              Balance Overall balance assessment: History of Falls;Needs assistance Sitting-balance support: Feet supported;No upper extremity supported Sitting balance-Leahy Scale: Good       Standing balance-Leahy Scale: Fair Standing balance comment: statically stand without UE support, but needs Bil UE support for ambulation                           ADL either performed or assessed with clinical judgement   ADL Overall ADL's : Needs assistance/impaired Eating/Feeding: Independent   Grooming: Set up;Sitting   Upper Body Bathing: Set up;Sitting   Lower Body Bathing: Minimal assistance;Sit to/from stand    Upper Body Dressing : Set up;Sitting   Lower Body Dressing: Moderate assistance Lower Body Dressing Details (indicate cue type and reason): min A sit<>stand Toilet Transfer: Minimal assistance;RW   Toileting- Clothing Manipulation and Hygiene: Minimal assistance;Sit to/from stand               Vision Patient Visual Report: No change from baseline              Pertinent Vitals/Pain Pain Assessment: Faces Faces Pain Scale: Hurts a little bit Pain Location: LLE with WB'ing Pain Descriptors / Indicators: Sore Pain Intervention(s): Limited activity within patient's tolerance;Monitored during session;Repositioned     Hand Dominance  right   Extremity/Trunk Assessment Upper Extremity Assessment Upper Extremity Assessment: Overall WFL for tasks assessed           Communication  No issues   Cognition Arousal/Alertness: Awake/alert Behavior During Therapy: WFL for tasks assessed/performed Overall Cognitive Status: Within Functional Limits for tasks assessed                                                Home Living Family/patient expects to be discharged to:: Skilled nursing facility  OT Problem List: Decreased strength;Decreased range of motion;Impaired balance (sitting and/or standing);Pain      OT Treatment/Interventions: Self-care/ADL training;Patient/family education;Balance training;DME and/or AE instruction    OT Goals(Current goals can be found in the care plan section) Acute Rehab OT Goals Patient Stated Goal: be able to return home eventually OT Goal Formulation: With patient Time For Goal Achievement: 09/25/16 Potential to Achieve Goals: Good  OT Frequency: Min 2X/week   Barriers to D/C: Decreased caregiver support             AM-PAC PT "6 Clicks" Daily Activity     Outcome Measure Help from another person eating meals?: None Help from another person  taking care of personal grooming?: A Little Help from another person toileting, which includes using toliet, bedpan, or urinal?: A Little Help from another person bathing (including washing, rinsing, drying)?: A Little Help from another person to put on and taking off regular upper body clothing?: A Little Help from another person to put on and taking off regular lower body clothing?: A Lot 6 Click Score: 18   End of Session Equipment Utilized During Treatment: Rolling walker  Activity Tolerance: Patient tolerated treatment well Patient left: in chair;with call bell/phone within reach;with chair alarm set  OT Visit Diagnosis: Unsteadiness on feet (R26.81);History of falling (Z91.81);Pain Pain - Right/Left: Left Pain - part of body: Leg                Time: 1610-9604 OT Time Calculation (min): 18 min Charges:  OT General Charges $OT Visit: 1 Visit OT Evaluation $OT Eval Moderate Complexity: [address], [place] [phone] 09/11/2016

## 2016-09-11 NOTE — Progress Notes (Signed)
Pharmacy Antibiotic Note  Andrea [name] is a 66 y.o. 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  Pharmacy was consulted for Vancomycin and Zosyn dosing on 8/30. Renal function has improved necessitating a dose adjustment of antibiotics.  Plan: Increase Vancomycin 500 mg IV every 12 hours.  Goal trough 15-20 mcg/mL. Continue Zosyn 3.375g IV q8h (4 hour infusion) Monitor clinical progress, cultures/sensitivities, renal function, abx plan Vancomycin trough as indicated   Height: 5\' 2"  (157.5 cm) Weight: 136 lb 14.4 oz (62.1 kg) IBW/kg (Calculated) : 50.1  Temp (24hrs), Avg:98.4 F (36.9 C), Min:97 F (36.1 C), Max:101.3 F (38.5 C)   Recent Labs Lab 09/10/16 1450 09/10/16 1502 09/11/16 0434  WBC 9.1  --  8.3  CREATININE  --  1.51* 1.04*    Estimated Creatinine Clearance: 48 mL/min (A) (by C-G formula based on SCr of 1.04 mg/dL (H)).    Allergies  Allergen Reactions  . Naproxen Itching  . Tramadol Hcl Itching    Antimicrobials this admission: 8/30 cefuroxime >> x1 intra-op 8/30 vanc >>  8/30 Zosyn >>  Dose adjustments this admission:  Microbiology results: 8/30 wound >> pending   Thank you for allowing us to participate in this patients care.  Toys 'R' UsKimberly Eastyn Dattilo, Pharm.D., BCPS Clinical Pharmacist Pager: [phone] Clinical phone for 09/11/2016 from 8:30-4:00 is [phone]. After 4pm, please call Main Rx [phone]) for assistance. 09/11/2016 9:48 AM

## 2016-09-11 NOTE — Care Management Note (Signed)
Case Management Note Donn PieriniKristi Nevaen Tredway RN, BSN Unit 4E-Case Manager [phone]  Patient Details  Name: Andrea [name] MRN: [id number] Date of Birth: [birth year]  Subjective/Objective:   Pt admitted with infected graft s/p I&D left groin hematoma, excision of left femoropopliteal bypass graft-- will return to OR on 9/1                 Action/Plan: PTA Pt lived at home- per PT/OT evals recommendations for SNF- CSW consulted for possible placement- CM will follow   Expected Discharge Date:                  Expected Discharge Plan:  Skilled Nursing Facility  In-House Referral:  Clinical Social Work  Discharge planning Services  CM Consult  Post Acute Care Choice:    Choice offered to:     DME Arranged:    DME Agency:     HH Arranged:    HH Agency:     Status of Service:  In process, will continue to follow  If discussed at Long Length of Stay Meetings, dates discussed:    Discharge Disposition:   Additional Comments:  Darrold SpanWebster, Holley Wirt Hall, RN 09/11/2016, 2:15 PM

## 2016-09-11 NOTE — Progress Notes (Addendum)
Progress Note  SUBJECTIVE:    POD #1  Pain with open incisions and left foot  OBJECTIVE:   Vitals:   09/11/16 0433 09/11/16 0734  BP: (!) 131/50 (!) 88/70  Pulse: (!) 109   Resp: 15   Temp: 97.6 F (36.4 C)   SpO2: 94% 94%    Intake/Output Summary (Last 24 hours) at 09/11/16 0800 Last data filed at 09/11/16 0400  Gross per 24 hour  Intake           4383.5 ml  Output             1275 ml  Net           3108.5 ml   Left groin and left leg open incisions relatively clean.  Left foot is pale without doppler signals.  ASSESSMENT/PLAN:   66 y.o. female is s/p: irrigation and drainage left groin hematoma, excision of left femoropopliteal bypass graft 1 Day Post-Op   -Infected left leg bypass: Dr. Randie Heinz to take patient to OR tomorrow for closure of distal open wounds and washout left groin. May need muscle flap in the future. Continue broad spectrum abx until wound cultures back.  -No doppler signals left foot. Dr. Randie Heinz to potentially consider cadaveric vein for new bypass. Patient is at high risk for amputation.  -Symptomatic anemia: Transfuse 2 units of prbcs this am.   Cala Bradford A Trinh 09/11/2016 8:00 AM -- LABS:   CBC    Component Value Date/Time   WBC 8.3 09/11/2016 0434   HGB 7.2 (L) 09/11/2016 0434   HGB 10.8 (L) 07/01/2016 1445   HCT 21.9 (L) 09/11/2016 0434   HCT 32.9 (L) 07/01/2016 1445   PLT 247 09/11/2016 0434   PLT 340 07/01/2016 1445    BMET    Component Value Date/Time   NA 138 09/11/2016 0434   NA 140 04/14/2016 1431   K 5.4 (H) 09/11/2016 0434   CL 106 09/11/2016 0434   CO2 25 09/11/2016 0434   GLUCOSE 263 (H) 09/11/2016 0434   BUN 36 (H) 09/11/2016 0434   BUN 19 04/14/2016 1431   CREATININE 1.04 (H) 09/11/2016 0434   CALCIUM 8.6 (L) 09/11/2016 0434   GFRNONAA 56 (L) 09/11/2016 0434   GFRAA >60 09/11/2016 0434    COAG Lab Results  Component Value Date   INR 1.09 09/10/2016   INR 0.93 07/16/2016   INR 1.10 06/12/2016   No results  found for: PTT  ANTIBIOTICS:   Anti-infectives    Start     Dose/Rate Route Frequency Ordered Stop   09/11/16 2030  vancomycin (VANCOCIN) 500 mg in sodium chloride 0.9 % 100 mL IVPB     500 mg 100 mL/hr over 60 Minutes Intravenous Every 24 hours 09/10/16 2030     09/10/16 2200  piperacillin-tazobactam (ZOSYN) IVPB 3.375 g     3.375 g 12.5 mL/hr over 240 Minutes Intravenous Every 8 hours 09/10/16 2021     09/10/16 2030  cefUROXime (ZINACEF) 1.5 g in dextrose 5 % 50 mL IVPB  Status:  Discontinued     1.5 g 100 mL/hr over 30 Minutes Intravenous Every 12 hours 09/10/16 2011 09/10/16 2021   09/10/16 2030  vancomycin (VANCOCIN) 1,250 mg in sodium chloride 0.9 % 250 mL IVPB     1,250 mg 166.7 mL/hr over 90 Minutes Intravenous  Once 09/10/16 2026 09/10/16 2322   09/10/16 1733  vancomycin (VANCOCIN) powder  Status:  Discontinued       As needed 09/10/16 1733  09/10/16 1831   09/10/16 1733  gentamicin (GARAMYCIN) injection  Status:  Discontinued       As needed 09/10/16 1735 09/10/16 1831   09/10/16 1403  cefUROXime (ZINACEF) 1.5 g in dextrose 5 % 50 mL IVPB     1.5 g 100 mL/hr over 30 Minutes Intravenous 30 min pre-op 09/10/16 1403 09/10/16 1529       [name], PA-C [facility] Office: [phone] Pager: [phone] 09/11/2016 8:00 AM     I have interviewed and examined patient with PA and agree with assessment and plan above. Plan for closure of distal incisions and washout of left groin tomorrow. Likely to need revascularization of left lower extremity to avoid amputation during this hospital setting. Will possibly need plastic surgery for closure of left groin.  Raylea Adcox C. Randie Heinzain, MD [facility] [place] Office: [phone] Pager: [phone]

## 2016-09-11 NOTE — Anesthesia Preprocedure Evaluation (Addendum)
Anesthesia Evaluation  Patient identified by MRN, date of birth, ID band Patient awake    Reviewed: Allergy & Precautions, NPO status , Patient's Chart, lab work & pertinent test results, reviewed documented beta blocker date and time   History of Anesthesia Complications (+) PONV and history of anesthetic complications  Airway Mallampati: I  TM Distance: >3 FB Neck ROM: Full    Dental  (+) Upper Dentures, Partial Lower, Dental Advisory Given, Poor Dentition   Pulmonary shortness of breath and with exertion, asthma , former smoker,    breath sounds clear to auscultation       Cardiovascular hypertension, Pt. on medications and Pt. on home beta blockers (-) angina+ CAD, + Cardiac Stents, + CABG and + Peripheral Vascular Disease   Rhythm:Regular Rate:Normal     Neuro/Psych PSYCHIATRIC DISORDERS negative neurological ROS     GI/Hepatic Neg liver ROS, GERD  Medicated and Controlled,  Endo/Other  diabetes, Type 2, Insulin Dependent  Renal/GU negative Renal ROS     Musculoskeletal  (+) Fibromyalgia -  Abdominal   Peds  Hematology negative hematology ROS (+)   Anesthesia Other Findings Fibromyalgia  Reproductive/Obstetrics                           Anesthesia Physical  Anesthesia Plan  ASA: III  Anesthesia Plan: General   Post-op Pain Management:    Induction: Intravenous  PONV Risk Score and Plan: 4 or greater and Ondansetron, Dexamethasone, Midazolam, Propofol infusion and Treatment may vary due to age or medical condition  Airway Management Planned: Oral ETT  Additional Equipment: None  Intra-op Plan:   Post-operative Plan: Extubation in OR  Informed Consent: I have reviewed the patients History and Physical, chart, labs and discussed the procedure including the risks, benefits and alternatives for the proposed anesthesia with the patient or authorized representative who has  indicated his/her understanding and acceptance.   Dental advisory given  Plan Discussed with: CRNA  Anesthesia Plan Comments:        Anesthesia Quick Evaluation

## 2016-09-12 ENCOUNTER — Inpatient Hospital Stay (HOSPITAL_COMMUNITY): Payer: BLUE CROSS/BLUE SHIELD | Admitting: Anesthesiology

## 2016-09-12 ENCOUNTER — Encounter (HOSPITAL_COMMUNITY)
Admission: AD | Disposition: A | Discharge status: Skilled Nursing Facility | Payer: Self-pay | Source: Ambulatory Visit | Attending: Vascular Surgery

## 2016-09-12 HISTORY — PX: I & D EXTREMITY: SHX5045

## 2016-09-12 LAB — BASIC METABOLIC PANEL
ANION GAP: 10 (ref 5–15)
BUN: 26 mg/dL — ABNORMAL HIGH (ref 6–20)
CO2: 23 mmol/L (ref 22–32)
Calcium: 8.4 mg/dL — ABNORMAL LOW (ref 8.9–10.3)
Chloride: 104 mmol/L (ref 101–111)
Creatinine, Ser: 0.78 mg/dL (ref 0.44–1.00)
GFR calc Af Amer: >60 mL/min (ref >60)
GLUCOSE: 165 mg/dL — AB (ref 65–99)
POTASSIUM: 3.7 mmol/L (ref 3.5–5.1)
Sodium: 137 mmol/L (ref 135–145)

## 2016-09-12 LAB — BPAM RBC
BLOOD PRODUCT EXPIRATION DATE: 201809142359
BLOOD PRODUCT EXPIRATION DATE: 201809172359
Blood Product Expiration Date: 201809172359
Blood Product Expiration Date: 201809172359
ISSUE DATE / TIME: 201808301539
ISSUE DATE / TIME: 201808301539
ISSUE DATE / TIME: 201808311121
ISSUE DATE / TIME: 201808311507
UNIT TYPE AND RH: 600
UNIT TYPE AND RH: 600
Unit Type and Rh: 600
Unit Type and Rh: 600

## 2016-09-12 LAB — TYPE AND SCREEN
ABO/RH(D): A NEG
ANTIBODY SCREEN: NEGATIVE
Unit division: 0
Unit division: 0
Unit division: 0
Unit division: 0

## 2016-09-12 LAB — CBC
HCT: 29.8 % — ABNORMAL LOW (ref 36.0–46.0)
HEMOGLOBIN: 10.1 g/dL — AB (ref 12.0–15.0)
MCH: 28.8 pg (ref 26.0–34.0)
MCHC: 33.9 g/dL (ref 30.0–36.0)
MCV: 84.9 fL (ref 78.0–100.0)
PLATELETS: 222 10*3/uL (ref 150–400)
RBC: 3.51 MIL/uL — AB (ref 3.87–5.11)
RDW: 15.8 % — ABNORMAL HIGH (ref 11.5–15.5)
WBC: 8.5 10*3/uL (ref 4.0–10.5)

## 2016-09-12 LAB — SURGICAL PCR SCREEN
MRSA, PCR: NEGATIVE
STAPHYLOCOCCUS AUREUS: NEGATIVE

## 2016-09-12 LAB — GLUCOSE, CAPILLARY
GLUCOSE-CAPILLARY: 134 mg/dL — AB (ref 65–99)
GLUCOSE-CAPILLARY: 120 mg/dL — AB (ref 65–99)
GLUCOSE-CAPILLARY: 153 mg/dL — AB (ref 65–99)
Glucose-Capillary: 157 mg/dL — ABNORMAL HIGH (ref 65–99)
Glucose-Capillary: 183 mg/dL — ABNORMAL HIGH (ref 65–99)

## 2016-09-12 SURGERY — MINOR IRRIGATION AND DEBRIDEMENT EXTREMITY
Anesthesia: General

## 2016-09-12 MED ORDER — LACTATED RINGERS IV SOLN
INTRAVENOUS | Status: DC | PRN
  Administered 2016-09-12: 08:00:00 via INTRAVENOUS

## 2016-09-12 MED ORDER — VANCOMYCIN HCL 500 MG IV SOLR
500.0000 mg | Freq: Two times a day (BID) | INTRAVENOUS | Status: DC
  Administered 2016-09-13 (×2): 500 mg via INTRAVENOUS
  Filled 2016-09-12 (×3): qty 500

## 2016-09-12 MED ORDER — PIPERACILLIN-TAZOBACTAM 3.375 G IVPB
3.3750 g | Freq: Three times a day (TID) | INTRAVENOUS | Status: DC
  Administered 2016-09-12 – 2016-09-13 (×3): 3.375 g via INTRAVENOUS
  Filled 2016-09-12 (×4): qty 50

## 2016-09-12 MED ORDER — PROPOFOL 10 MG/ML IV BOLUS
INTRAVENOUS | Status: DC | PRN
  Administered 2016-09-12: 100 mg via INTRAVENOUS

## 2016-09-12 MED ORDER — EPHEDRINE SULFATE 50 MG/ML IJ SOLN
INTRAMUSCULAR | Status: DC | PRN
  Administered 2016-09-12 (×2): 5 mg via INTRAVENOUS

## 2016-09-12 MED ORDER — ROCURONIUM BROMIDE 100 MG/10ML IV SOLN
INTRAVENOUS | Status: DC | PRN
  Administered 2016-09-12: 30 mg via INTRAVENOUS

## 2016-09-12 MED ORDER — LIDOCAINE HCL (CARDIAC) 20 MG/ML IV SOLN
INTRAVENOUS | Status: DC | PRN
  Administered 2016-09-12: 60 mg via INTRAVENOUS

## 2016-09-12 MED ORDER — HYDROMORPHONE 1 MG/ML IV SOLN
INTRAVENOUS | Status: DC
  Administered 2016-09-12: 1.5 mg via INTRAVENOUS
  Administered 2016-09-13 (×2): 0.3 mg via INTRAVENOUS
  Administered 2016-09-14 (×2): 0 mg via INTRAVENOUS
  Administered 2016-09-14: 1.2 mg via INTRAVENOUS
  Administered 2016-09-14 (×2): 0 mg via INTRAVENOUS
  Administered 2016-09-15: 0.6 mg via INTRAVENOUS
  Administered 2016-09-16 (×2): 0.3 mg via INTRAVENOUS
  Administered 2016-09-17: 0 mg via INTRAVENOUS
  Administered 2016-09-17: 1.5 mg via INTRAVENOUS
  Administered 2016-09-17: 2.1 mL via INTRAVENOUS
  Administered 2016-09-18: 0 mg via INTRAVENOUS
  Administered 2016-09-18: 0 mL via INTRAVENOUS
  Administered 2016-09-18: 0.3 mg via INTRAVENOUS
  Administered 2016-09-18: 21:00:00 via INTRAVENOUS
  Administered 2016-09-19: 1.5 mg via INTRAVENOUS
  Administered 2016-09-19: 0 mg via INTRAVENOUS
  Administered 2016-09-19: 0 via INTRAVENOUS
  Administered 2016-09-19: 0.6 mg via INTRAVENOUS
  Administered 2016-09-19: 0 mg via INTRAVENOUS
  Administered 2016-09-19: 0.3 mg via INTRAVENOUS
  Administered 2016-09-20: 0 mg via INTRAVENOUS
  Administered 2016-09-20: 0.3 mg via INTRAVENOUS
  Administered 2016-09-20 (×3): 0 mg via INTRAVENOUS
  Administered 2016-09-20: 1.2 mg via INTRAVENOUS
  Administered 2016-09-21: 0 mg via INTRAVENOUS
  Administered 2016-09-21 (×2): 0.3 mg via INTRAVENOUS
  Administered 2016-09-21: 0 mL via INTRAVENOUS
  Administered 2016-09-21 – 2016-09-22 (×2): 0 mg via INTRAVENOUS
  Administered 2016-09-22: 0.6 mg via INTRAVENOUS
  Filled 2016-09-12: qty 25

## 2016-09-12 MED ORDER — FENTANYL CITRATE (PF) 250 MCG/5ML IJ SOLN
INTRAMUSCULAR | Status: AC
Start: 2016-09-12 — End: 2016-09-12
  Filled 2016-09-12: qty 5

## 2016-09-12 MED ORDER — FENTANYL CITRATE (PF) 100 MCG/2ML IJ SOLN
INTRAMUSCULAR | Status: DC | PRN
  Administered 2016-09-12: 50 ug via INTRAVENOUS

## 2016-09-12 MED ORDER — 0.9 % SODIUM CHLORIDE (POUR BTL) OPTIME
TOPICAL | Status: DC | PRN
  Administered 2016-09-12: 1000 mL

## 2016-09-12 MED ORDER — SUGAMMADEX SODIUM 200 MG/2ML IV SOLN
INTRAVENOUS | Status: DC | PRN
  Administered 2016-09-12: 200 mg via INTRAVENOUS

## 2016-09-12 MED ORDER — SODIUM CHLORIDE 0.9% FLUSH: 9.0000 mL | INTRAVENOUS | Status: DC | PRN

## 2016-09-12 MED ORDER — ONDANSETRON HCL 4 MG/2ML IJ SOLN: 4.0000 mg | Freq: Four times a day (QID) | INTRAMUSCULAR | Status: DC | PRN

## 2016-09-12 MED ORDER — NALOXONE HCL 0.4 MG/ML IJ SOLN: 0.4000 mg | INTRAMUSCULAR | Status: DC | PRN

## 2016-09-12 MED ORDER — DIPHENHYDRAMINE HCL 12.5 MG/5ML PO ELIX
12.5000 mg | ORAL_SOLUTION | Freq: Four times a day (QID) | ORAL | Status: DC | PRN
  Filled 2016-09-12: qty 5

## 2016-09-12 MED ORDER — PROPOFOL 10 MG/ML IV BOLUS
INTRAVENOUS | Status: AC
  Filled 2016-09-12: qty 20

## 2016-09-12 MED ORDER — PHENYLEPHRINE HCL 10 MG/ML IJ SOLN
INTRAMUSCULAR | Status: DC | PRN
  Administered 2016-09-12 (×4): 80 ug via INTRAVENOUS
  Administered 2016-09-12: 40 ug via INTRAVENOUS

## 2016-09-12 MED ORDER — MIDAZOLAM HCL 2 MG/2ML IJ SOLN
INTRAMUSCULAR | Status: AC
  Filled 2016-09-12: qty 2

## 2016-09-12 MED ORDER — DIPHENHYDRAMINE HCL 50 MG/ML IJ SOLN: 12.5000 mg | Freq: Four times a day (QID) | INTRAMUSCULAR | Status: DC | PRN

## 2016-09-12 MED ORDER — ONDANSETRON HCL 4 MG/2ML IJ SOLN
INTRAMUSCULAR | Status: DC | PRN
Start: 2016-09-12 — End: 2016-09-12
  Administered 2016-09-12: 4 mg via INTRAVENOUS

## 2016-09-12 SURGICAL SUPPLY — 32 items
BAG ISL DRAPE 18X18 STRL (DRAPES) ×1
BAG ISOLATION DRAPE 18X18 (DRAPES) IMPLANT
BNDG GAUZE ELAST 4 BULKY (GAUZE/BANDAGES/DRESSINGS) ×1 IMPLANT
CANISTER SUCT 3000ML PPV (MISCELLANEOUS) ×2 IMPLANT
DRAPE HALF SHEET 40X57 (DRAPES) ×4 IMPLANT
DRAPE ISOLATION BAG 18X18 (DRAPES) ×1
DRAPE LAPAROSCOPIC ABDOMINAL (DRAPES) ×1 IMPLANT
DRAPE ORTHO SPLIT 77X108 STRL (DRAPES) ×4
DRAPE SURG ORHT 6 SPLT 77X108 (DRAPES) IMPLANT
DRAPE U-SHAPE 47X51 STRL (DRAPES) ×1 IMPLANT
DRSG COVADERM 4X6 (GAUZE/BANDAGES/DRESSINGS) ×1 IMPLANT
DRSG COVADERM 4X8 (GAUZE/BANDAGES/DRESSINGS) ×1 IMPLANT
DRSG PAD ABDOMINAL 8X10 ST (GAUZE/BANDAGES/DRESSINGS) IMPLANT
ELECT REM PT RETURN 9FT ADLT (ELECTROSURGICAL) ×2
ELECTRODE REM PT RTRN 9FT ADLT (ELECTROSURGICAL) ×1 IMPLANT
GAUZE SPONGE 4X4 12PLY STRL (GAUZE/BANDAGES/DRESSINGS) IMPLANT
GAUZE SPONGE 4X4 12PLY STRL LF (GAUZE/BANDAGES/DRESSINGS) ×1 IMPLANT
GLOVE BIO SURGEON STRL SZ7.5 (GLOVE) ×2 IMPLANT
GLOVE BIOGEL PI IND STRL 6.5 (GLOVE) IMPLANT
GLOVE BIOGEL PI INDICATOR 6.5 (GLOVE) ×1
GOWN STRL REUS W/ TWL LRG LVL3 (GOWN DISPOSABLE) ×2 IMPLANT
GOWN STRL REUS W/ TWL XL LVL3 (GOWN DISPOSABLE) ×1 IMPLANT
GOWN STRL REUS W/TWL LRG LVL3 (GOWN DISPOSABLE) ×4
GOWN STRL REUS W/TWL XL LVL3 (GOWN DISPOSABLE) ×2
KIT BASIN OR (CUSTOM PROCEDURE TRAY) ×2 IMPLANT
KIT ROOM TURNOVER OR (KITS) ×2 IMPLANT
NS IRRIG 1000ML POUR BTL (IV SOLUTION) ×4 IMPLANT
PACK GENERAL/GYN (CUSTOM PROCEDURE TRAY) ×2 IMPLANT
PAD ARMBOARD 7.5X6 YLW CONV (MISCELLANEOUS) ×4 IMPLANT
SUT PROLENE 2 TP 1 (SUTURE) IMPLANT
TAPE CLOTH SURG 4X10 WHT LF (GAUZE/BANDAGES/DRESSINGS) ×1 IMPLANT
WATER STERILE IRR 1000ML POUR (IV SOLUTION) ×2 IMPLANT

## 2016-09-12 NOTE — Anesthesia Procedure Notes (Signed)
Procedure Name: Intubation Date/Time: 09/12/2016 8:00 AM Performed by: [name], [name] Pre-anesthesia Checklist: Patient identified, Emergency Drugs available, Suction available, Patient being monitored and Timeout performed Patient Re-evaluated:Patient Re-evaluated prior to induction Oxygen Delivery Method: Circle system utilized Preoxygenation: Pre-oxygenation with 100% oxygen Induction Type: IV induction Ventilation: Mask ventilation without difficulty Laryngoscope Size: Miller and 2 Grade View: Grade I Tube type: Oral Tube size: 7.5 mm Number of attempts: 1 Airway Equipment and Method: Patient positioned with wedge pillow Placement Confirmation: ETT inserted through vocal cords under direct vision,  positive ETCO2 and breath sounds checked- equal and bilateral Secured at: 22 cm Tube secured with: Tape Dental Injury: Teeth and Oropharynx as per pre-operative assessment

## 2016-09-12 NOTE — Progress Notes (Signed)
  Progress Note    09/12/2016 7:19 AM Day of Surgery  Subjective:  No issues  Vitals:   09/12/16 0400 09/12/16 0500  BP: (!) 114/40 (!) 116/49  Pulse: 62 63  Resp: 16 13  Temp: 98.4 F (36.9 C)   SpO2: 92% 98%    Physical Exam: aaox3 Non labored respirations lle incisions cdi Stable gangrene left great toe  CBC    Component Value Date/Time   WBC 8.5 09/12/2016 0526   RBC 3.51 (L) 09/12/2016 0526   HGB 10.1 (L) 09/12/2016 0526   HGB 10.8 (L) 07/01/2016 1445   HCT 29.8 (L) 09/12/2016 0526   HCT 32.9 (L) 07/01/2016 1445   PLT 222 09/12/2016 0526   PLT 340 07/01/2016 1445   MCV 84.9 09/12/2016 0526   MCV 88 07/01/2016 1445   MCH 28.8 09/12/2016 0526   MCHC 33.9 09/12/2016 0526   RDW 15.8 (H) 09/12/2016 0526   RDW 15.5 (H) 07/01/2016 1445   LYMPHSABS 3.0 07/01/2016 1445   MONOABS 0.3 05/25/2016 0349   EOSABS 0.4 07/01/2016 1445   BASOSABS 0.0 07/01/2016 1445    BMET    Component Value Date/Time   NA 137 09/12/2016 0526   NA 140 04/14/2016 1431   K 3.7 09/12/2016 0526   CL 104 09/12/2016 0526   CO2 23 09/12/2016 0526   GLUCOSE 165 (H) 09/12/2016 0526   BUN 26 (H) 09/12/2016 0526   BUN 19 04/14/2016 1431   CREATININE 0.78 09/12/2016 0526   CALCIUM 8.4 (L) 09/12/2016 0526   GFRNONAA >60 09/12/2016 0526   GFRAA >60 09/12/2016 0526    INR    Component Value Date/Time   INR 1.09 09/10/2016 1450     Intake/Output Summary (Last 24 hours) at 09/12/16 0719 Last data filed at 09/12/16 [id number]  Gross per 24 hour  Intake          2651.25 ml  Output             1150 ml  Net          1501.25 ml     Assessment:  66 y.o. female is s/p left fem-pop graft removal and patch angioplasty of left common femoral and below knee popliteal artery  Plan: OR today for washout of left groin wound, possible closure of other incisions.  Will defer revascularization of left lower extremity.    Davanee Klinkner C. Randie Heinzain, MD [facility] [place] Office:  [phone] Pager: [phone]  09/12/2016 7:19 AM

## 2016-09-12 NOTE — Op Note (Signed)
    Patient name: Andrea [name] B Radliff MRN: [id number] DOB: 28-Jul-1950 Sex: female  09/12/2016 Pre-operative Diagnosis: left groin wounds Post-operative diagnosis:  Same Surgeon:  Apolinar JunesBrandon C. Randie Heinzain, MD Assistant: OR nurse Procedure Performed: Washout and partial closure of left groin wound, closure of left thigh and below knee wounds  Indications:  66 year old female with a history of a left femoral-popliteal artery bypass with graft. She presented to the office and had dehiscence of her proximal anastomosis underwent emergent removal of the graft. The time all wounds were left open and packed. She is now indicated for return to the operating room with washout and possible closure.  Findings: There was fibrinous debris in the left groin wound which was debrided with scissors. We were able to identify the anastomosis which had antibiotic beads overlying it. Some soft tissue was able to be reapproximated in the left groin. The below-knee incision and above-knee incision were cleaned were irrigated and were closed primarily with staples.   Procedure:  The patient was identified in the holding area and taken to the operating room where she is placed supine on the operating table and general endotracheal anesthesia was induced. She is artery on antibiotics received them in preoperative holding area. Timeout was called. We began by evaluating the left groin wound this was irrigated. We're able to identify the patch on the medial aspect of the wound which had an robotic beads overlying it was some soft tissue coverage. Did debride some fibrinous exudate with scissors until there was bleeding. I was unable to close some soft tissue overlying the patch. The wound was heavily irrigated again. Then irrigated the above-knee and below-knee incisions these appeared healthy all muscle appeared viable. These were primarily closed with staples. A wet-to-dry dressing was placed in the left groin and dry dressings were placed  over the stapled areas. Patient tolerated procedure well without immediate complications and all counts were correct at completion.    Yani Coventry C. Randie Heinzain, MD [facility] [place] Office: [phone] Pager: [phone]

## 2016-09-12 NOTE — Transfer of Care (Signed)
Immediate Anesthesia Transfer of Care Note  Patient: Andrea [name]  Procedure(s) Performed: Procedure(s): Irrigation and Debridment Left Groin; Closure of Distal Incisions Left Leg; Medial Calf and Medial Thigh (N/A)  Patient Location: PACU  Anesthesia Type:General  Level of Consciousness: awake, alert  and oriented  Airway & Oxygen Therapy: Patient Spontanous Breathing and Patient connected to nasal cannula oxygen  Post-op Assessment: Report given to RN and Post -op Vital signs reviewed and stable  Post vital signs: Reviewed and stable  Last Vitals:  Vitals:   09/12/16 0400 09/12/16 0500  BP: (!) 114/40 (!) 116/49  Pulse: 62 63  Resp: 16 13  Temp: 36.9 C   SpO2: 92% 98%    Last Pain:  Vitals:   09/12/16 0400  TempSrc: Oral  PainSc:          Complications: No apparent anesthesia complications

## 2016-09-12 NOTE — Progress Notes (Addendum)
Assumed care from off going RN about 0710; patient was on her to the OR; patient returned back to room about 1000; patient alert & oriented; denies pain at this time; PCA restarted; NSR on tele ; bed in lowest position; see epic for charting  @1225  patient has c/o's pain 7; patient was given 2 tabs hydrocodone; PCA hooked back up; history cleared @1347  see epic charting; MD rounded; MD will change dressing tomorrow   @1550  notified MD about Micro lab staph growth; MD will consult infectious disease    @1805  Fleveril 5mg  given for spasm @1805  2 tab Norco  @1820  MD paged and asked to assessed pain control; possiblility of increasing dose of PCA or change pain medication PRN's; see epic for changes  Late entry: gave report to on coming nurse Nedra HaiLee, RN and that orders for PCA need to be clarify

## 2016-09-12 NOTE — Anesthesia Postprocedure Evaluation (Signed)
Anesthesia Post Note  Patient: Andrea [name]  Procedure(s) Performed: Procedure(s) (LRB): Irrigation and Debridment Left Groin; Closure of Distal Incisions Left Leg; Medial Calf and Medial Thigh (N/A)     Patient location during evaluation: PACU Anesthesia Type: General Level of consciousness: awake and alert Pain management: pain level controlled Vital Signs Assessment: post-procedure vital signs reviewed and stable Respiratory status: spontaneous breathing, nonlabored ventilation, respiratory function stable and patient connected to nasal cannula oxygen Cardiovascular status: blood pressure returned to baseline and stable Postop Assessment: no signs of nausea or vomiting Anesthetic complications: no    Last Vitals:  Vitals:   09/12/16 0930 09/12/16 1043  BP:  113/60  Pulse:  73  Resp:  14  Temp: (!) 36.4 C 37.2 C  SpO2:  97%    Last Pain:  Vitals:   09/12/16 1043  TempSrc: Oral  PainSc:                  Beryle Lathehomas E Brock

## 2016-09-13 DIAGNOSIS — Y838 Other surgical procedures as the cause of abnormal reaction of the patient, or of later complication, without mention of misadventure at the time of the procedure: Secondary | ICD-10-CM

## 2016-09-13 DIAGNOSIS — T827XXA Infection and inflammatory reaction due to other cardiac and vascular devices, implants and grafts, initial encounter: Principal | ICD-10-CM

## 2016-09-13 LAB — GLUCOSE, CAPILLARY
GLUCOSE-CAPILLARY: 152 mg/dL — AB (ref 65–99)
GLUCOSE-CAPILLARY: 84 mg/dL (ref 65–99)
Glucose-Capillary: 191 mg/dL — ABNORMAL HIGH (ref 65–99)
Glucose-Capillary: 109 mg/dL — ABNORMAL HIGH (ref 65–99)

## 2016-09-13 MED ORDER — VANCOMYCIN HCL 500 MG IV SOLR
500.0000 mg | Freq: Two times a day (BID) | INTRAVENOUS | Status: DC
  Administered 2016-09-14 – 2016-09-23 (×18): 500 mg via INTRAVENOUS
  Filled 2016-09-13 (×27): qty 500

## 2016-09-13 NOTE — Progress Notes (Signed)
Assumed care from off going RN @0710 : alert & oriented; denies SOB; comfortable; HOB elevated; bed in lowest position;    @0830  PCA settings checked; see epic for charting

## 2016-09-13 NOTE — Consult Note (Signed)
[facility]       Reason for Consult: infected graft    Referring Physician: Dr. Randie Heinzain  Active Problems:   Infected prosthetic vascular graft (HCC)   . aspirin EC  81 mg Oral Daily  . atorvastatin  80 mg Oral QHS  . docusate sodium  100 mg Oral Daily  . DULoxetine  60 mg Oral Daily  . furosemide  20 mg Oral Daily  . gabapentin  300 mg Oral TID  . hydrochlorothiazide  25 mg Oral Daily  . HYDROmorphone   Intravenous Q4H  . insulin aspart  0-9 Units Subcutaneous TID WC  . insulin glargine  30 Units Subcutaneous QHS  . metoprolol tartrate  12.5 mg Oral BID  . mometasone-formoterol  2 puff Inhalation BID  . oxybutynin  5 mg Oral BID  . pantoprazole  20 mg Oral Daily  . 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 Both sensitive to doxycycline.  Will use IV vancomycin while here.  To get plastic surgery involvement.   Vancomycin day 4 Pip-tazo day 4   HPI: Andrea [name] is a 66 y.o. female with a history of bilateral femoral popliteal artery bypass with graft and was having some subjective fevers, n/v and pain in her left leg so underwent exploration of the left groin and noted thrombosed femoropopliteal graft.  Wound cultures from OR as above with 2 Staph species.  Was started empirically on vancomycin and zosyn.  Did have a fever to 101, WBC has remained wnl. No significant pain at this time. Went back to the OR yesterday and washed out and closure of wound.     Review of Systems:  Constitutional: negative for fevers and chills Gastrointestinal: negative for nausea, vomiting and diarrhea Integument/breast: negative for rash All other systems reviewed and are negative    Past Medical History:    Diagnosis Date  . Asthma   . Coronary artery disease   . Diabetes mellitus without complication (HCC)   . Dyspnea   . Fibromyalgia   . GERD (gastroesophageal reflux disease)   . High cholesterol   . Hypertension   . PONV (postoperative nausea and vomiting)   . PVD (peripheral vascular disease) (HCC)   . Urinary frequency   . Urinary incontinence     Social History  Substance Use Topics  . Smoking status: Former Smoker    Packs/day: 0.25    Types: Cigarettes    Quit date: 04/04/2016  . Smokeless tobacco: Never Used  . Alcohol use No    Family History  Problem Relation Age of Onset  . Stroke Mother     Allergies  Allergen Reactions  . Naproxen Itching  . Tramadol Hcl Itching    Physical Exam: Constitutional: in no apparent distress and alert  Vitals:   09/13/16 0904 09/13/16 1201  BP:  (!) 154/70  Pulse:  67  Resp:  14  Temp:  97.9 F (36.6 C)  SpO2: 97% 96%   EYES: anicteric ENMT: no thrush Cardiovascular: Cor RRR Respiratory: CTA B; normal respiratory effort GI: Bowel sounds are normal, liver is not enlarged, spleen is not enlarged Musculoskeletal: no pedal edema noted; left great toe dry gangrene Skin: incisions without surrounding erythema on left groin and medial leg  Lab Results  Component Value Date   WBC 8.5 09/12/2016   HGB 10.1 (L) 09/12/2016   HCT 29.8 (L) 09/12/2016   MCV 84.9 09/12/2016   PLT 222 09/12/2016    Lab Results  Component Value Date   CREATININE 0.78 09/12/2016   BUN 26 (H) 09/12/2016   NA 137 09/12/2016   K 3.7 09/12/2016   CL 104 09/12/2016   CO2 23 09/12/2016    Lab Results  Component Value Date   ALT 12 (L) 09/10/2016   AST 18 09/10/2016   ALKPHOS 56 09/10/2016     Microbiology: Recent Results (from the past 240 hour(s))  Aerobic/Anaerobic Culture (surgical/deep wound)     Status: None (Preliminary result)   Collection Time: 09/10/16  4:40 PM  Result Value Ref Range Status   Specimen Description WOUND  LEFT GROIN  Final   Special Requests POF ZYNACEF  Final   Gram Stain   Final    MODERATE WBC PRESENT,BOTH PMN AND MONONUCLEAR NO ORGANISMS SEEN    Culture   Final    RARE STAPHYLOCOCCUS AUREUS CULTURE REINCUBATED FOR BETTER GROWTH RARE DIPHTHEROIDS(CORYNEBACTERIUM SPECIES) Standardized susceptibility testing for this organism is not available. NO ANAEROBES ISOLATED; CULTURE IN PROGRESS FOR 5 DAYS    Report Status PENDING  Incomplete  Gram stain     Status: None   Collection Time: 09/10/16  4:49 PM  Result Value Ref Range Status   Specimen Description WOUND LEFT GROIN GRAFT  Final   Special Requests POF ZYNACEF  Final   Gram Stain   Final    MODERATE WBC PRESENT,BOTH PMN AND MONONUCLEAR NO ORGANISMS SEEN Gram Stain Report Called to,Read Back By and Verified With: C. BICK,RN 1809 09/10/2016 T. TYSOR    Report Status 09/10/2016 FINAL  Final  Aerobic/Anaerobic Culture (surgical/deep wound)     Status: None (Preliminary result)   Collection Time: 09/10/16  5:42 PM  Result Value Ref Range Status   Specimen Description TISSUE  Final   Special Requests   Final    LEFT FEMORAL GORTEX GRAFT PATIENT ON FOLLOWING ZINACEF   Gram Stain   Final    ABUNDANT WBC PRESENT, PREDOMINANTLY PMN NO ORGANISMS SEEN    Culture   Final    RARE METHICILLIN RESISTANT STAPHYLOCOCCUS AUREUS RARE STAPHYLOCOCCUS SPECIES (COAGULASE NEGATIVE) NO ANAEROBES ISOLATED; CULTURE IN PROGRESS FOR 5 DAYS CRITICAL RESULT CALLED TO, READ BACK BY AND VERIFIED WITH: M VELLAMY,RN AT 1548 BY L BENFIELD    Report Status PENDING  Incomplete   Organism ID, Bacteria METHICILLIN RESISTANT STAPHYLOCOCCUS AUREUS  Final   Organism ID, Bacteria STAPHYLOCOCCUS SPECIES (COAGULASE NEGATIVE)  Final      Susceptibility   Methicillin resistant staphylococcus aureus - MIC*    CIPROFLOXACIN >=8 RESISTANT Resistant     ERYTHROMYCIN >=8 RESISTANT Resistant     GENTAMICIN <=0.5 SENSITIVE Sensitive     OXACILLIN >=4 RESISTANT Resistant      TETRACYCLINE <=1 SENSITIVE Sensitive     VANCOMYCIN <=0.5 SENSITIVE Sensitive     TRIMETH/SULFA <=10 SENSITIVE Sensitive     CLINDAMYCIN >=8 RESISTANT Resistant     RIFAMPIN <=0.5 SENSITIVE Sensitive     Inducible Clindamycin NEGATIVE Sensitive     * RARE METHICILLIN RESISTANT STAPHYLOCOCCUS AUREUS   Staphylococcus species (coagulase negative) - MIC*    CIPROFLOXACIN >=8 RESISTANT Resistant     ERYTHROMYCIN <=0.25 SENSITIVE Sensitive     GENTAMICIN <=0.5 SENSITIVE Sensitive     OXACILLIN >=4 RESISTANT Resistant     TETRACYCLINE  2 SENSITIVE Sensitive     VANCOMYCIN 1 SENSITIVE Sensitive     TRIMETH/SULFA 80 RESISTANT Resistant     CLINDAMYCIN <=0.25 SENSITIVE Sensitive     RIFAMPIN <=0.5 SENSITIVE Sensitive     Inducible Clindamycin NEGATIVE Sensitive     * RARE STAPHYLOCOCCUS SPECIES (COAGULASE NEGATIVE)  Surgical pcr screen     Status: None   Collection Time: 09/12/16  3:45 AM  Result Value Ref Range Status   MRSA, PCR NEGATIVE NEGATIVE Final   Staphylococcus aureus NEGATIVE NEGATIVE Final    Comment: (NOTE) The Xpert SA Assay (FDA approved for NASAL specimens in patients 41 years of age and older), is one component of a comprehensive surveillance program. It is not intended to diagnose infection nor to guide or monitor treatment.     Staci Righter, MD [email or web address] [phone] pager  [phone] cell 09/13/2016, 2:25 PM

## 2016-09-13 NOTE — Progress Notes (Addendum)
Progress Note  SUBJECTIVE:    POD #1  Pain with incisions.   OBJECTIVE:   Vitals:   09/13/16 0000 09/13/16 0401  BP: (!) 114/38 (!) 127/48  Pulse: (!) 56 (!) 55  Resp: 11 (!) 21  Temp: 98.7 F (37.1 C) 98.7 F (37.1 C)  SpO2: 100% 96%    Intake/Output Summary (Last 24 hours) at 09/13/16 0831 Last data filed at 09/13/16 0500  Gross per 24 hour  Intake             1060 ml  Output             1075 ml  Net              -15 ml   Left groin with serosanguinous drainage. Left medial leg incisions clean. Staples intact. Monophasic doppler signals at left ankle. Dry gangrene left great toe. Right leg doppler signals.   ASSESSMENT/PLAN:   66 y.o. female is s/p: left fem-pop graft removal and patch angioplasty of left common femoral and below knee popliteal artery 1 Day Post-Op   Left groin growing rare coag neg staph. Currently on vanc and zosyn. Will consult ID. Will have plastic surgery eval early next week for muscle flap.  Betadine paint to left great toe.  Remove foley  Andrea [name] 09/13/2016 8:31 AM -- LABS:   CBC    Component Value Date/Time   WBC 8.5 09/12/2016 0526   HGB 10.1 (L) 09/12/2016 0526   HGB 10.8 (L) 07/01/2016 1445   HCT 29.8 (L) 09/12/2016 0526   HCT 32.9 (L) 07/01/2016 1445   PLT 222 09/12/2016 0526   PLT 340 07/01/2016 1445    BMET    Component Value Date/Time   NA 137 09/12/2016 0526   NA 140 04/14/2016 1431   K 3.7 09/12/2016 0526   CL 104 09/12/2016 0526   CO2 23 09/12/2016 0526   GLUCOSE 165 (H) 09/12/2016 0526   BUN 26 (H) 09/12/2016 0526   BUN 19 04/14/2016 1431   CREATININE 0.78 09/12/2016 0526   CALCIUM 8.4 (L) 09/12/2016 0526   GFRNONAA >60 09/12/2016 0526   GFRAA >60 09/12/2016 0526    COAG Lab Results  Component Value Date   INR 1.09 09/10/2016   INR 0.93 07/16/2016   INR 1.10 06/12/2016   No results found for: PTT  ANTIBIOTICS:   Anti-infectives    Start     Dose/Rate Route Frequency Ordered Stop     09/13/16 0500  vancomycin (VANCOCIN) 500 mg in sodium chloride 0.9 % 100 mL IVPB     500 mg 100 mL/hr over 60 Minutes Intravenous Every 12 hours 09/12/16 1822     09/12/16 2000  piperacillin-tazobactam (ZOSYN) IVPB 3.375 g     3.375 g 12.5 mL/hr over 240 Minutes Intravenous Every 8 hours 09/12/16 1822     09/12/16 0600  cefUROXime (ZINACEF) 1.5 g in dextrose 5 % 50 mL IVPB     1.5 g 100 mL/hr over 30 Minutes Intravenous To ShortStay Surgical 09/11/16 0807 09/13/16 0600   09/11/16 2030  vancomycin (VANCOCIN) 500 mg in sodium chloride 0.9 % 100 mL IVPB  Status:  Discontinued     500 mg 100 mL/hr over 60 Minutes Intravenous Every 24 hours 09/10/16 2030 09/11/16 0949   09/11/16 1000  vancomycin (VANCOCIN) 500 mg in sodium chloride 0.9 % 100 mL IVPB  Status:  Discontinued     500 mg 100 mL/hr over 60 Minutes Intravenous Every 12 hours  09/11/16 0949 09/12/16 1822   09/10/16 2200  piperacillin-tazobactam (ZOSYN) IVPB 3.375 g  Status:  Discontinued     3.375 g 12.5 mL/hr over 240 Minutes Intravenous Every 8 hours 09/10/16 2021 09/12/16 1822   09/10/16 2030  cefUROXime (ZINACEF) 1.5 g in dextrose 5 % 50 mL IVPB  Status:  Discontinued     1.5 g 100 mL/hr over 30 Minutes Intravenous Every 12 hours 09/10/16 2011 09/10/16 2021   09/10/16 2030  vancomycin (VANCOCIN) 1,250 mg in sodium chloride 0.9 % 250 mL IVPB     1,250 mg 166.7 mL/hr over 90 Minutes Intravenous  Once 09/10/16 2026 09/10/16 2322   09/10/16 1733  vancomycin (VANCOCIN) powder  Status:  Discontinued       As needed 09/10/16 1733 09/10/16 1831   09/10/16 1733  gentamicin (GARAMYCIN) injection  Status:  Discontinued       As needed 09/10/16 1735 09/10/16 1831   09/10/16 1403  cefUROXime (ZINACEF) 1.5 g in dextrose 5 % 50 mL IVPB     1.5 g 100 mL/hr over 30 Minutes Intravenous 30 min pre-op 09/10/16 1403 09/10/16 1529       Andrea BergerKimberly Trinh, PA-C [facility] Office: [phone] Pager:  [phone] 09/13/2016 8:31 AM     I have interviewed and examined patient with PA and agree with assessment and plan above.   Andrea Lohmeyer C. Randie Heinzain, MD [facility] [place] Office: [phone] Pager: [phone]

## 2016-09-14 ENCOUNTER — Encounter (HOSPITAL_COMMUNITY): Payer: Self-pay | Admitting: Vascular Surgery

## 2016-09-14 DIAGNOSIS — T827XXD Infection and inflammatory reaction due to other cardiac and vascular devices, implants and grafts, subsequent encounter: Secondary | ICD-10-CM

## 2016-09-14 LAB — GLUCOSE, CAPILLARY
GLUCOSE-CAPILLARY: 171 mg/dL — AB (ref 65–99)
GLUCOSE-CAPILLARY: 119 mg/dL — AB (ref 65–99)
GLUCOSE-CAPILLARY: 135 mg/dL — AB (ref 65–99)

## 2016-09-14 NOTE — Progress Notes (Addendum)
Progress Note  SUBJECTIVE:    POD #2  No complaints.  OBJECTIVE:   Vitals:   09/14/16 0400 09/14/16 0730  BP: (!) 144/90 (!) 144/90  Pulse: 71 (!) 55  Resp: (!) 21 19  Temp: 98.7 F (37.1 C)   SpO2: 100% 100%    Intake/Output Summary (Last 24 hours) at 09/14/16 0814 Last data filed at 09/14/16 0600  Gross per 24 hour  Intake              340 ml  Output             2150 ml  Net            -1810 ml   Left groin with serosanguinous drainage. No purulence seen. No granulation tissue seen.  Left foot with faint DP signal. Dry gangrene to left great toe.   ASSESSMENT/PLAN:   66 y.o. female is s/p: Washout and partial closure of left groin wound, closure of left thigh and below knee wounds (09/12/2016), left fem-pop graft removal and patch angioplasty of left common femoral and below knee popliteal artery (09/10/16) 2 Days Post-Op   Left foot currently viable.  Will continue wound care left groin. Plastic surgery has been consulted.  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 Currently on vancomycin. ID has seen. Will stay on IV vanc while inpatient.  Appreciate plastic surgery and ID assistance.   Cala BradfordKimberly A Trinh 09/14/2016 8:14 AM -- LABS:   CBC    Component Value Date/Time   WBC 8.5 09/12/2016 0526   HGB 10.1 (L) 09/12/2016 0526   HGB 10.8 (L) 07/01/2016 1445   HCT 29.8 (L) 09/12/2016 0526   HCT 32.9 (L) 07/01/2016 1445   PLT 222 09/12/2016 0526   PLT 340 07/01/2016 1445    BMET    Component Value Date/Time   NA 137 09/12/2016 0526   NA 140 04/14/2016 1431   K 3.7 09/12/2016 0526   CL 104 09/12/2016 0526   CO2 23 09/12/2016 0526   GLUCOSE 165 (H) 09/12/2016 0526   BUN 26 (H) 09/12/2016 0526   BUN 19 04/14/2016 1431   CREATININE 0.78 09/12/2016 0526   CALCIUM 8.4 (L) 09/12/2016 0526   GFRNONAA >60 09/12/2016 0526   GFRAA >60 09/12/2016 0526    COAG Lab Results  Component Value Date   INR 1.09 09/10/2016   INR 0.93 07/16/2016   INR 1.10  06/12/2016   No results found for: PTT  ANTIBIOTICS:   Anti-infectives    Start     Dose/Rate Route Frequency Ordered Stop   09/14/16 0600  vancomycin (VANCOCIN) 500 mg in sodium chloride 0.9 % 100 mL IVPB     500 mg 100 mL/hr over 60 Minutes Intravenous Every 12 hours 09/13/16 1926     09/13/16 0500  vancomycin (VANCOCIN) 500 mg in sodium chloride 0.9 % 100 mL IVPB  Status:  Discontinued     500 mg 100 mL/hr over 60 Minutes Intravenous Every 12 hours 09/12/16 1822 09/13/16 1926   09/12/16 2000  piperacillin-tazobactam (ZOSYN) IVPB 3.375 g  Status:  Discontinued     3.375 g 12.5 mL/hr over 240 Minutes Intravenous Every 8 hours 09/12/16 1822 09/13/16 1432   09/12/16 0600  cefUROXime (ZINACEF) 1.5 g in dextrose 5 % 50 mL IVPB     1.5 g 100 mL/hr over 30 Minutes Intravenous To ShortStay Surgical 09/11/16 0807 09/13/16 0600   09/11/16 2030  vancomycin (VANCOCIN) 500 mg in sodium chloride 0.9 % 100  mL IVPB  Status:  Discontinued     500 mg 100 mL/hr over 60 Minutes Intravenous Every 24 hours 09/10/16 2030 09/11/16 0949   09/11/16 1000  vancomycin (VANCOCIN) 500 mg in sodium chloride 0.9 % 100 mL IVPB  Status:  Discontinued     500 mg 100 mL/hr over 60 Minutes Intravenous Every 12 hours 09/11/16 0949 09/12/16 1822   09/10/16 2200  piperacillin-tazobactam (ZOSYN) IVPB 3.375 g  Status:  Discontinued     3.375 g 12.5 mL/hr over 240 Minutes Intravenous Every 8 hours 09/10/16 2021 09/12/16 1822   09/10/16 2030  cefUROXime (ZINACEF) 1.5 g in dextrose 5 % 50 mL IVPB  Status:  Discontinued     1.5 g 100 mL/hr over 30 Minutes Intravenous Every 12 hours 09/10/16 2011 09/10/16 2021   09/10/16 2030  vancomycin (VANCOCIN) 1,250 mg in sodium chloride 0.9 % 250 mL IVPB     1,250 mg 166.7 mL/hr over 90 Minutes Intravenous  Once 09/10/16 2026 09/10/16 2322   09/10/16 1733  vancomycin (VANCOCIN) powder  Status:  Discontinued       As needed 09/10/16 1733 09/10/16 1831   09/10/16 1733  gentamicin  (GARAMYCIN) injection  Status:  Discontinued       As needed 09/10/16 1735 09/10/16 1831   09/10/16 1403  cefUROXime (ZINACEF) 1.5 g in dextrose 5 % 50 mL IVPB     1.5 g 100 mL/hr over 30 Minutes Intravenous 30 min pre-op 09/10/16 1403 09/10/16 1529       Maris Berger, PA-C [facility] Office: [phone] Pager: [phone] 09/14/2016 8:14 AM    I have interviewed and examined patient with PA and agree with assessment and plan above. Appreciate ID and plastic surgery input. Will look at dates for angiogram to plan flap coverage and also considering future reconstruction to heal left great toe.   Savannaha Stonerock C. Randie Heinz, MD [facility] [place] Office: [phone] Pager: [phone]

## 2016-09-14 NOTE — Progress Notes (Signed)
Pharmacy Antibiotic Note  Andrea [name] is a 66 y.o. 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  Pharmacy was consulted for Vancomycin and Zosyn dosing on 8/30. His Zosyn was discontinued due to culture data revealing MRSA.  ID has been following and suggest continuing Vanc while here then start Doxycycline to finish out her course at discharge.  Plan: Increase Vancomycin 500 mg IV every 12 hours.  Goal trough 15-20 mcg/mL. Monitor clinical progress, cultures/sensitivities, renal function, abx plan Vancomycin trough tomorrow   Height: 5\' 2"  (157.5 cm) Weight: 136 lb 14.4 oz (62.1 kg) IBW/kg (Calculated) : 50.1  Temp (24hrs), Avg:98.7 F (37.1 C), Min:98.1 F (36.7 C), Max:99.2 F (37.3 C)   Recent Labs Lab 09/10/16 1450 09/10/16 1502 09/11/16 0434 09/12/16 0526  WBC 9.1  --  8.3 8.5  CREATININE  --  1.51* 1.04* 0.78    Estimated Creatinine Clearance: 62.4 mL/min (by C-G formula based on SCr of 0.78 mg/dL).    Allergies  Allergen Reactions  . Naproxen Itching  . Tramadol Hcl Itching   Antimicrobials this admission: 8/30 cefuroxime >> x1 intra-op 8/30 vanc >>  8/30 Zosyn >>9/1  Dose adjustments this admission:  Microbiology results: 8/30 wound >> pending  Nadara MustardNita Manav Pierotti, PharmD., MS Clinical Pharmacist Pager:  [phone] Thank you for allowing pharmacy to be part of this patients care team. 09/14/2016 2:14 PM

## 2016-09-14 NOTE — Progress Notes (Signed)
[facility]   Reason for visit: Follow up on infected graft  Interval History: seen by Dr. Leta Baptist, possible surgery 9/7.  No new growth  Physical Exam: Constitutional:  Vitals:   09/14/16 0400 09/14/16 0730  BP: (!) 144/90 (!) 144/90  Pulse: 71 (!) 55  Resp: (!) 21 19  Temp: 98.7 F (37.1 C)   SpO2: 100% 100%   patient appears in NAD Respiratory: Normal respiratory effort; CTA B Cardiovascular: RRR GI: soft, nt, nd  Review of Systems: Integument/breast: negative for rash  Lab Results  Component Value Date   WBC 8.5 09/12/2016   HGB 10.1 (L) 09/12/2016   HCT 29.8 (L) 09/12/2016   MCV 84.9 09/12/2016   PLT 222 09/12/2016    Lab Results  Component Value Date   CREATININE 0.78 09/12/2016   BUN 26 (H) 09/12/2016   NA 137 09/12/2016   K 3.7 09/12/2016   CL 104 09/12/2016   CO2 23 09/12/2016    Lab Results  Component Value Date   ALT 12 (L) 09/10/2016   AST 18 09/10/2016   ALKPHOS 56 09/10/2016     Microbiology: Recent Results (from the past 240 hour(s))  Aerobic/Anaerobic Culture (surgical/deep wound)     Status: None (Preliminary result)   Collection Time: 09/10/16  4:40 PM  Result Value Ref Range Status   Specimen Description WOUND LEFT GROIN  Final   Special Requests POF ZYNACEF  Final   Gram Stain   Final    MODERATE WBC PRESENT,BOTH PMN AND MONONUCLEAR NO ORGANISMS SEEN    Culture   Final    RARE STAPHYLOCOCCUS AUREUS CULTURE REINCUBATED FOR BETTER GROWTH RARE DIPHTHEROIDS(CORYNEBACTERIUM SPECIES) Standardized susceptibility testing for this organism is not available. NO ANAEROBES ISOLATED; CULTURE IN PROGRESS FOR 5 DAYS    Report Status PENDING  Incomplete  Gram stain     Status: None   Collection Time: 09/10/16  4:49 PM  Result Value Ref Range Status   Specimen Description WOUND LEFT GROIN GRAFT  Final   Special Requests POF ZYNACEF  Final   Gram Stain   Final    MODERATE WBC PRESENT,BOTH PMN AND  MONONUCLEAR NO ORGANISMS SEEN Gram Stain Report Called to,Read Back By and Verified With: C. BICK,RN 1809 09/10/2016 T. TYSOR    Report Status 09/10/2016 FINAL  Final  Aerobic/Anaerobic Culture (surgical/deep wound)     Status: None (Preliminary result)   Collection Time: 09/10/16  5:42 PM  Result Value Ref Range Status   Specimen Description TISSUE  Final   Special Requests   Final    LEFT FEMORAL GORTEX GRAFT PATIENT ON FOLLOWING ZINACEF   Gram Stain   Final    ABUNDANT WBC PRESENT, PREDOMINANTLY PMN NO ORGANISMS SEEN    Culture   Final    RARE METHICILLIN RESISTANT STAPHYLOCOCCUS AUREUS RARE STAPHYLOCOCCUS SPECIES (COAGULASE NEGATIVE) NO ANAEROBES ISOLATED; CULTURE IN PROGRESS FOR 5 DAYS CRITICAL RESULT CALLED TO, READ BACK BY AND VERIFIED WITH: M VELLAMY,RN AT 1548 BY L BENFIELD    Report Status PENDING  Incomplete   Organism ID, Bacteria METHICILLIN RESISTANT STAPHYLOCOCCUS AUREUS  Final   Organism ID, Bacteria STAPHYLOCOCCUS SPECIES (COAGULASE NEGATIVE)  Final      Susceptibility   Methicillin resistant staphylococcus aureus - MIC*    CIPROFLOXACIN >=8 RESISTANT Resistant     ERYTHROMYCIN >=8 RESISTANT Resistant     GENTAMICIN <=0.5 SENSITIVE Sensitive     OXACILLIN >=4 RESISTANT Resistant     TETRACYCLINE <=1  SENSITIVE Sensitive     VANCOMYCIN <=0.5 SENSITIVE Sensitive     TRIMETH/SULFA <=10 SENSITIVE Sensitive     CLINDAMYCIN >=8 RESISTANT Resistant     RIFAMPIN <=0.5 SENSITIVE Sensitive     Inducible Clindamycin NEGATIVE Sensitive     * RARE METHICILLIN RESISTANT STAPHYLOCOCCUS AUREUS   Staphylococcus species (coagulase negative) - MIC*    CIPROFLOXACIN >=8 RESISTANT Resistant     ERYTHROMYCIN <=0.25 SENSITIVE Sensitive     GENTAMICIN <=0.5 SENSITIVE Sensitive     OXACILLIN >=4 RESISTANT Resistant     TETRACYCLINE 2 SENSITIVE Sensitive     VANCOMYCIN 1 SENSITIVE Sensitive     TRIMETH/SULFA 80 RESISTANT Resistant     CLINDAMYCIN <=0.25 SENSITIVE Sensitive      RIFAMPIN <=0.5 SENSITIVE Sensitive     Inducible Clindamycin NEGATIVE Sensitive     * RARE STAPHYLOCOCCUS SPECIES (COAGULASE NEGATIVE)  Surgical pcr screen     Status: None   Collection Time: 09/12/16  3:45 AM  Result Value Ref Range Status   MRSA, PCR NEGATIVE NEGATIVE Final   Staphylococcus aureus NEGATIVE NEGATIVE Final    Comment: (NOTE) The Xpert SA Assay (FDA approved for NASAL specimens in patients 64 years of age and older), is one component of a comprehensive surveillance program. It is not intended to diagnose infection nor to guide or monitor treatment.     Impression/Plan:  1. Infected graft with removal.  CoNS and Staph aureus, doxycycline sensitive.  Treat for 14 days through 9/14.  Can continue vancomycin while here and discharge on doxycycline for the remaining time.   I will sign off, thanks

## 2016-09-14 NOTE — Progress Notes (Signed)
Pt removed IV access from R. Hand. Gauze and tape placed. L. AC still intact.

## 2016-09-14 NOTE — Clinical Social Work Note (Signed)
Clinical Social Work Assessment  Patient Details  Name: Andrea [name] MRN: [id number] Date of Birth: [birth year]  Date of referral:  09/14/16               Reason for consult:  Discharge Planning, Facility Placement                Permission sought to share information with:  Family Supports Permission granted to share information::  Yes, Verbal Permission Granted  Name::        Agency::  SNF  Relationship::     Contact Information:     Housing/Transportation Living arrangements for the past 2 months:  Single Family Home Source of Information:  Patient Patient Interpreter Needed:    Criminal Activity/Legal Involvement Pertinent to Current Situation/Hospitalization:    Significant Relationships:  Adult Children, Other Family Members Lives with:  Self Do you feel safe going back to the place where you live?  Yes Need for family participation in patient care:  No (Coment)  Care giving concerns: No family at bedside. Patient stated she lives by herself and stated family is not supportive   Facilities manager / plan:  Holiday representative met patient at bedside to offer support and discuss discharge needs. Patient stated she lives by herself and has family in the area but they are not as supportive as she would like them to be. Patient stated she has been at a SNF in the past and is agreeable to discharge back to SNF. Patient stated she will only go back to SNF if insurance will pay for stay. Patient stated if insurance does not pay for SNF stay she will discharge home. CSW to complete necessary paper work and initiate SNF search on behalf of patient. CSW to follow up with patient once bed offers has been made.  Employment status:  Retired Forensic scientist:  Other (Comment Required) Nurse, mental health) PT Recommendations:  [place] / Referral to community resources:  [place]  Patient/Family's Response to care:  Patient appreciative of CSW  role in care   Patient/Family's Understanding of and Emotional Response to Diagnosis, Current Treatment, and Prognosis:  Patient has understanding of current hospitalization and limitations. Patient agreeable to discharge to SNF once medically stable Emotional Assessment Appearance:  Appears stated age Attitude/Demeanor/Rapport:  Other Affect (typically observed):  Pleasant Orientation:  Oriented to Self, Oriented to Place, Oriented to  Time, Oriented to Situation Alcohol / Substance use:  Not Applicable Psych involvement (Current and /or in the community):  No (Comment)  Discharge Needs  Concerns to be addressed:  No discharge needs identified Readmission within the last 30 days:  No Current discharge risk:  None Barriers to Discharge:  No Barriers Identified   Wende Neighbors, LCSW 09/14/2016, 11:48 AM

## 2016-09-14 NOTE — Consult Note (Signed)
Reason for Consult: groin wound Referring Physician: Mariel CraftB Cain MD [facility] Date: 9.3.18  Andrea [name] is an 66 y.o. female.  HPI: Plastic Surgery consulted for possible muscle flap coverage left groin wound. Patient with bilateral critical limb ischemia post RIGHT fem pop 05/2016. Course complicated by some wound healing issues, had one debridement in OR and notes indicate superficial problem with no exposure graft.  Underwent LEFT femoral to popliteal bypass 7.9.18. Returned to OR 8.30.18 and hematoma evacuated, noted dehiscence proximal limb, presumed due to infection and graft removed. BPA of L CFA , and L BK popliteal completed. Had repeat washout 9.1.18 and distal incisions closed. Groin wound remains oven, antibiotic beads in place. Cultures MRSA, on Vancomycin. Has dry gangrene left great toe.  Last HbA1c 2 weeks ago 6.8.  PMH significant for CABG with harvest LIMA. Had PCI to stent placement to SVG supplying PDA 4.2018.  Past Medical History:  Diagnosis Date  . Asthma   . Coronary artery disease   . Diabetes mellitus without complication (HCC)   . Dyspnea   . Fibromyalgia   . GERD (gastroesophageal reflux disease)   . High cholesterol   . Hypertension   . PONV (postoperative nausea and vomiting)   . PVD (peripheral vascular disease) (HCC)   . Urinary frequency   . Urinary incontinence     Past Surgical History:  Procedure Laterality Date  . ABDOMINAL AORTOGRAM W/LOWER EXTREMITY N/A 03/25/2016   Procedure: Abdominal Aortogram w/Lower Extremity;  Surgeon: Maeola HarmanBrandon Christopher Cain, MD;  [facility];  Service: Cardiovascular;  Laterality: N/A;  . APPLICATION OF WOUND VAC Right 06/14/2016   Procedure: APPLICATION OF WOUND VAC;  Surgeon: Sherren KernsFields, Charles E, MD;  [facility];  Service: Vascular;  Laterality: Right;  . CORONARY ARTERY BYPASS GRAFT    . CORONARY STENT INTERVENTION N/A 04/15/2016   Procedure: Coronary Stent  Intervention;  Surgeon: Lennette Biharihomas A Kelly, MD;  Location: [facility] INVASIVE CV LAB;  Service: Cardiovascular;  Laterality: N/A;  . ENDARTERECTOMY FEMORAL Right 05/21/2016   Procedure: RIGHT FEMORAL ENDARTERECTOMY;  Surgeon: Maeola Harmanain, Brandon Christopher, MD;  [facility];  Service: Vascular;  Laterality: Right;  . ENDARTERECTOMY FEMORAL Left 07/20/2016   Procedure: LEFT FEMORAL ENDARTERECTOMY;  Surgeon: Maeola Harmanain, Brandon Christopher, MD;  [facility];  Service: Vascular;  Laterality: Left;  . FEMORAL BYPASS Left 07/20/2016   Left common femoral endarterectomy  . FEMORAL-POPLITEAL BYPASS GRAFT Right 05/21/2016   Procedure: RIGHT FEMORAL-BELOW KNEE POPLITEAL ARTERY BYPASS GRAFT USING PROPATEN GORETEX GRAFT;  Surgeon: Maeola Harmanain, Brandon Christopher, MD;  [facility];  Service: Vascular;  Laterality: Right;  . FEMORAL-POPLITEAL BYPASS GRAFT Left 07/20/2016   Procedure: LEFT FEMORAL TO BELOW KNEE POPLITEAL ARTERY  BYPASS GRAFT;  Surgeon: Maeola Harmanain, Brandon Christopher, MD;  [facility];  Service: Vascular;  Laterality: Left;  . GROIN DEBRIDEMENT Right 06/14/2016   Procedure: GROIN DEBRIDEMENT;  Surgeon: Sherren KernsFields, Charles E, MD;  [facility];  Service: Vascular;  Laterality: Right;  . KNEE SURGERY    . LEFT HEART CATH AND CORS/GRAFTS ANGIOGRAPHY N/A 04/15/2016   Procedure: Left Heart Cath and Cors/Grafts Angiography;  Surgeon: Lennette Biharihomas A Kelly, MD;  Location: [facility] INVASIVE CV LAB;  Service: Cardiovascular;  Laterality: N/A;  . MULTIPLE TOOTH EXTRACTIONS    . PATCH ANGIOPLASTY Left 09/10/2016   Procedure: [place] PATCH ANGIOPLASTY Left Femoral Artery and Below Knee Popliteal Artery;  Surgeon: Fransisco Hertzhen, Brian L, MD;  [facility];  Service: Vascular;  Laterality:  Left;  . REMOVAL OF GRAFT Left 09/10/2016   Procedure: Excision of Left Femoral-Popliteal Graft, Placement of Antibiotic Beads Left Leg;  Surgeon: Fransisco Hertz, MD;  Location: [facility] OR;  Service: Vascular;  Laterality: Left;    Family History  Problem Relation Age of  Onset  . Stroke Mother     Social History:  reports that she quit smoking about 5 months ago. Her smoking use included Cigarettes. She smoked 0.25 packs per day. She has never used smokeless tobacco. She reports that she does not drink alcohol or use drugs.  Allergies:  Allergies  Allergen Reactions  . Naproxen Itching  . Tramadol Hcl Itching    Medications: I have reviewed the patient's current medications.   ROS Blood pressure (!) 144/90, pulse (!) 55, temperature 98.7 F (37.1 C), temperature source Oral, resp. rate 19, height 5\' 2"  (1.575 m), weight 62.1 kg (136 lb 14.4 oz), SpO2 100 %. Physical Exam Alert NAD Right groin healed Left foot with eschar over plantar hallus lifting Left mid thigh and knee incisions with staples in place dry Left groin open wound 9 x 4.5 x 4 cm wound, soft superficial repair over vasculature noted, wound with 90% granulation  Assessment/Plan: Had angiogram 03/2016-I am unable to view these images. SFA is occluded. Will need to discuss with Dr. Randie Heinz but assuming has inflow, candidate for gracilus muscle flap. Not candidate for rectus abdominus flap given prior LIMA harvest.  Discussed with patient rationale for muscle flap surgery namely to cover threatened exposed major vessel, fill dead space and encourage infection clearance. This will not address the blood flow to foot however. Discussed surgery utilizing prior thigh scar, use JP drains and VAC post op. Patient agreeable and tentatively would be 9.7.18  Considers Dakins bid to tid packing as we await surgery.   Glenna Fellows, MD [facility] [phone], pin [phone]

## 2016-09-14 NOTE — Progress Notes (Signed)
Pt removed L.AC IV access. Gauze and tape placed. IV insertion attempted by by Verlee Monteora was unsuccessful. IV team consulted.

## 2016-09-15 LAB — AEROBIC/ANAEROBIC CULTURE W GRAM STAIN (SURGICAL/DEEP WOUND)

## 2016-09-15 LAB — BASIC METABOLIC PANEL
Anion gap: 11 (ref 5–15)
BUN: 23 mg/dL — AB (ref 6–20)
CALCIUM: 8.6 mg/dL — AB (ref 8.9–10.3)
CHLORIDE: 101 mmol/L (ref 101–111)
CO2: 29 mmol/L (ref 22–32)
Creatinine, Ser: 0.99 mg/dL (ref 0.44–1.00)
GFR, EST NON AFRICAN AMERICAN: 59 mL/min — AB (ref >60)
Glucose, Bld: 80 mg/dL (ref 65–99)
Potassium: 3.2 mmol/L — ABNORMAL LOW (ref 3.5–5.1)
SODIUM: 141 mmol/L (ref 135–145)

## 2016-09-15 LAB — GLUCOSE, CAPILLARY
GLUCOSE-CAPILLARY: 76 mg/dL (ref 65–99)
GLUCOSE-CAPILLARY: 77 mg/dL (ref 65–99)
GLUCOSE-CAPILLARY: 92 mg/dL (ref 65–99)
Glucose-Capillary: 79 mg/dL (ref 65–99)
Glucose-Capillary: 79 mg/dL (ref 65–99)

## 2016-09-15 LAB — CBC
HCT: 32.9 % — ABNORMAL LOW (ref 36.0–46.0)
Hemoglobin: 10.6 g/dL — ABNORMAL LOW (ref 12.0–15.0)
MCH: 28.6 pg (ref 26.0–34.0)
MCHC: 32.2 g/dL (ref 30.0–36.0)
MCV: 88.7 fL (ref 78.0–100.0)
PLATELETS: 333 10*3/uL (ref 150–400)
RBC: 3.71 MIL/uL — ABNORMAL LOW (ref 3.87–5.11)
RDW: 14.4 % (ref 11.5–15.5)
WBC: 13.1 10*3/uL — ABNORMAL HIGH (ref 4.0–10.5)

## 2016-09-15 LAB — AEROBIC/ANAEROBIC CULTURE (SURGICAL/DEEP WOUND)

## 2016-09-15 MED ORDER — SODIUM CHLORIDE 0.9 % IV SOLN: INTRAVENOUS | Status: DC

## 2016-09-15 MED ORDER — SODIUM CHLORIDE 0.9 % NICU IV INFUSION SIMPLE: INJECTION | INTRAVENOUS | Status: DC

## 2016-09-15 NOTE — Progress Notes (Signed)
Physical Therapy Treatment Patient Details Name: Andrea [name] MRN: [id number] DOB: 1950/03/23 Today's Date: 09/15/2016    History of Present Illness 66 year old female s/p redo Lt fempop BPG with I&D 8/30, partial closure and washout 9/1. PMHx: Lt fem-below knee popliteal artery BPG and Lt fem endarterectomy  07/20/16, CABG, fibromyalgia, urinary incontinence, right fem-below knee popliteal artery BPG with prior infection and debridement    PT Comments    Pt with impaired cognition, processing and function today. Mobility limited by cognition and pain with increased assist required for all aspects of mobility today. Pt sitting in chair attempting to answer phone end of session and dropped phone, picked it up upside down and then sat it down thinking she hung up when she didn't. Pt will continue to benefit from therapy to maximize mobility and function. Discussed with RN change in behavior and cognition with RN stating she will address pain medication.  Hr 57 SpO2 95% on RA    Follow Up Recommendations  SNF;Supervision for mobility/OOB     Equipment Recommendations       Recommendations for Other Services       Precautions / Restrictions Precautions Precautions: Fall Restrictions Weight Bearing Restrictions: No    Mobility  Bed Mobility               General bed mobility comments: Up in recliner upon arrival  Transfers Overall transfer level: Needs assistance   Transfers: Sit to/from Stand Sit to Stand: Mod assist         General transfer comment: cues for hand placement and safety with assist to rise from surface and stabilize, pt with increased trunk flexion and difficulty righting today  Ambulation/Gait Ambulation/Gait assistance: Min assist Ambulation Distance (Feet): 25 Feet Assistive device: Rolling walker (2 wheeled) Gait Pattern/deviations: Step-to pattern;Decreased stride length;Trunk flexed   Gait velocity interpretation: Below normal speed for  age/gender General Gait Details: assist and cues for posture and position in RW. Assist to direct RW with mod cues for safety. Limited by cognition and pain with increased assist required today   Stairs            Wheelchair Mobility    Modified Rankin (Stroke Patients Only)       Balance Overall balance assessment: Needs assistance   Sitting balance-Leahy Scale: Fair       Standing balance-Leahy Scale: Poor                              Cognition Arousal/Alertness: Lethargic;Suspect due to medications Behavior During Therapy: [facility]WFL for tasks assessed/performed Overall Cognitive Status: Impaired/Different from baseline Area of Impairment: Orientation;Attention;Memory;Following commands;Safety/judgement;Problem solving                 Orientation Level: Disoriented to;Time Current Attention Level: Sustained Memory: Decreased short-term memory Following Commands: Follows one step commands consistently;Follows one step commands with increased time Safety/Judgement: Decreased awareness of safety;Decreased awareness of deficits   Problem Solving: Slow processing;Decreased initiation;Difficulty sequencing;Requires verbal cues;Requires tactile cues General Comments: Pt unaware of month, year, day. states she has been hallucinating. Pt with tangential statements and easily distracted.       Exercises General Exercises - Lower Extremity Long Arc Quad: AROM;Left;Seated;15 reps Hip Flexion/Marching: AAROM;Left;Seated;15 reps    General Comments        Pertinent Vitals/Pain Pain Assessment: 0-10 Pain Score: 4  Pain Location: LLE in stance Pain Descriptors / Indicators: Sore Pain Intervention(s): Limited activity within patient's  tolerance;Repositioned    Home Living                      Prior Function            PT Goals (current goals can now be found in the care plan section) Progress towards PT goals: Not progressing toward goals -  comment (decreased cognition and mobility)    Frequency           PT Plan Current plan remains appropriate    Co-evaluation              AM-PAC PT "6 Clicks" Daily Activity  Outcome Measure  Difficulty turning over in bed (including adjusting bedclothes, sheets and blankets)?: A Lot Difficulty moving from lying on back to sitting on the side of the bed? : A Lot Difficulty sitting down on and standing up from a chair with arms (e.g., wheelchair, bedside commode, etc,.)?: Unable Help needed moving to and from a bed to chair (including a wheelchair)?: A Lot Help needed walking in hospital room?: A Lot Help needed climbing 3-5 steps with a railing? : Total 6 Click Score: 10    End of Session Equipment Utilized During Treatment: Gait belt Activity Tolerance: Patient tolerated treatment well Patient left: in chair;with chair alarm set;with call bell/phone within reach Nurse Communication: Mobility status;Other (comment) (change in cognition with increased confusion presumed due to PCA)       Time: 1610-9604 PT Time Calculation (min) (ACUTE ONLY): 20 min  Charges:  $Gait Training: 8-22 mins                    G Codes:       Andrea [name], PT [phone]   Andrea [name] B Andrea [name] 09/15/2016, 2:26 PM

## 2016-09-15 NOTE — Progress Notes (Addendum)
[facility] [place]  Subjective  - Doing well over all.   Objective (!) 123/57 64 98 F (36.7 C) (Oral) 17 100%  Intake/Output Summary (Last 24 hours) at 09/15/16 0727 Last data filed at 09/15/16 0700  Gross per 24 hour  Intake              760 ml  Output              550 ml  Net              210 ml    Doppler DP B Left leg incisions healing well Groin base mixed beefy red with yellow eschar.  Wet to dry packed guaze.  Assessment/Planning: POD # 3  66 y.o. female is s/p: Washout and partial closure of left groin wound, closure of left thigh and below knee wounds (09/12/2016), left fem-pop graft removal and patch angioplasty of left common femoral and below knee popliteal artery (09/10/16)  No change in dry gangrene left great toe, foot appears viable. 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 Currently on vancomycin. ID has seen. Plastic surgery has seen. Will stay on IV vanc while inpatient.   Clinton GallantCOLLINS, EMMA [facility] 09/15/2016 7:27 AM --  Laboratory Lab Results: No results for input(s): WBC, HGB, HCT, PLT in the last 72 hours. BMET No results for input(s): NA, K, CL, CO2, GLUCOSE, BUN, CREATININE, CALCIUM in the last 72 hours.  COAG Lab Results  Component Value Date   INR 1.09 09/10/2016   INR 0.93 07/16/2016   INR 1.10 06/12/2016   No results found for: PTT  I have independently interviewed patient and agree with PA assessment and plan above. Angiogram tomorrow to plan flap coverage of left groin and future bypass possibilites with gangrene of left great toe. Npo past midnight. ID and plastic surgery on board and assistance appreciated.   Kensleigh Gates C. Randie Heinzain, MD [facility] [place] Office: [phone] Pager: [phone]

## 2016-09-15 NOTE — Progress Notes (Addendum)
Assumed care from off going RN; patient in bed with no c/o's; @0813  patient assisted to chair; PCA settings checked; c/o's pain 5 on Pain scale; patient reoriented to place, and time; chair alerm in place; will continue to monitor patient through out shift  Patient still pleasantly confused; PCA pump not being used much; patient up in chair with alarm in placed; Jasmine DecemberSharon daughter left phone [phone] and wanted to check in on patient;

## 2016-09-16 ENCOUNTER — Inpatient Hospital Stay (HOSPITAL_COMMUNITY): Payer: BLUE CROSS/BLUE SHIELD | Admitting: Anesthesiology

## 2016-09-16 ENCOUNTER — Encounter (HOSPITAL_COMMUNITY): Payer: Self-pay | Admitting: Anesthesiology

## 2016-09-16 ENCOUNTER — Encounter (HOSPITAL_COMMUNITY)
Admission: AD | Disposition: A | Discharge status: Skilled Nursing Facility | Payer: Self-pay | Source: Ambulatory Visit | Attending: Vascular Surgery

## 2016-09-16 DIAGNOSIS — T814XXA Infection following a procedure, initial encounter: Secondary | ICD-10-CM

## 2016-09-16 DIAGNOSIS — I998 Other disorder of circulatory system: Secondary | ICD-10-CM

## 2016-09-16 HISTORY — PX: AORTOGRAM: SHX6300

## 2016-09-16 LAB — GLUCOSE, CAPILLARY
GLUCOSE-CAPILLARY: 79 mg/dL (ref 65–99)
Glucose-Capillary: 84 mg/dL (ref 65–99)
Glucose-Capillary: 78 mg/dL (ref 65–99)
Glucose-Capillary: 83 mg/dL (ref 65–99)
Glucose-Capillary: 118 mg/dL — ABNORMAL HIGH (ref 65–99)

## 2016-09-16 LAB — VANCOMYCIN, TROUGH: Vancomycin Tr: 15 ug/mL (ref 15–20)

## 2016-09-16 SURGERY — AORTOGRAM
Anesthesia: Monitor Anesthesia Care | Site: Groin

## 2016-09-16 MED ORDER — PROPOFOL 10 MG/ML IV BOLUS
INTRAVENOUS | Status: AC
  Filled 2016-09-16: qty 40

## 2016-09-16 MED ORDER — HYDRALAZINE HCL 20 MG/ML IJ SOLN: 5.0000 mg | INTRAMUSCULAR | Status: DC | PRN

## 2016-09-16 MED ORDER — SODIUM CHLORIDE 0.9 % IV SOLN
INTRAVENOUS | Status: AC
  Administered 2016-09-16: 15:00:00 via INTRAVENOUS

## 2016-09-16 MED ORDER — LIDOCAINE HCL (PF) 1 % IJ SOLN
INTRAMUSCULAR | Status: DC | PRN
  Administered 2016-09-16: 6 mL

## 2016-09-16 MED ORDER — LACTATED RINGERS IV SOLN
INTRAVENOUS | Status: DC
  Administered 2016-09-16 – 2016-09-18 (×2): via INTRAVENOUS

## 2016-09-16 MED ORDER — MIDAZOLAM HCL 2 MG/2ML IJ SOLN
INTRAMUSCULAR | Status: AC
  Filled 2016-09-16: qty 2

## 2016-09-16 MED ORDER — LABETALOL HCL 5 MG/ML IV SOLN: 10.0000 mg | INTRAVENOUS | Status: DC | PRN

## 2016-09-16 MED ORDER — SODIUM CHLORIDE 0.9 % IV SOLN: 250.0000 mL | INTRAVENOUS | Status: DC | PRN

## 2016-09-16 MED ORDER — 0.9 % SODIUM CHLORIDE (POUR BTL) OPTIME
TOPICAL | Status: DC | PRN
  Administered 2016-09-16: 1000 mL

## 2016-09-16 MED ORDER — HEPARIN SODIUM (PORCINE) 5000 UNIT/ML IJ SOLN
INTRAMUSCULAR | Status: DC | PRN
  Administered 2016-09-16: 500 mL

## 2016-09-16 MED ORDER — LIDOCAINE HCL (PF) 1 % IJ SOLN
INTRAMUSCULAR | Status: AC
  Filled 2016-09-16: qty 30

## 2016-09-16 MED ORDER — SODIUM CHLORIDE 0.9% FLUSH
3.0000 mL | Freq: Two times a day (BID) | INTRAVENOUS | Status: DC
  Administered 2016-09-16 (×2): 3 mL via INTRAVENOUS

## 2016-09-16 MED ORDER — FENTANYL CITRATE (PF) 100 MCG/2ML IJ SOLN
INTRAMUSCULAR | Status: DC | PRN
  Administered 2016-09-16: 25 ug via INTRAVENOUS
  Administered 2016-09-16: 50 ug via INTRAVENOUS
  Administered 2016-09-16: 25 ug via INTRAVENOUS

## 2016-09-16 MED ORDER — FENTANYL CITRATE (PF) 250 MCG/5ML IJ SOLN
INTRAMUSCULAR | Status: AC
  Filled 2016-09-16: qty 5

## 2016-09-16 MED ORDER — ONDANSETRON HCL 4 MG/2ML IJ SOLN
INTRAMUSCULAR | Status: DC | PRN
  Administered 2016-09-16: 4 mg via INTRAVENOUS

## 2016-09-16 MED ORDER — SODIUM CHLORIDE 0.9 % IJ SOLN: INTRAVENOUS | Status: DC | PRN

## 2016-09-16 MED ORDER — MIDAZOLAM HCL 5 MG/5ML IJ SOLN
INTRAMUSCULAR | Status: DC | PRN
  Administered 2016-09-16: 0.5 mg via INTRAVENOUS
  Administered 2016-09-16: 1 mg via INTRAVENOUS
  Administered 2016-09-16: 0.5 mg via INTRAVENOUS

## 2016-09-16 MED ORDER — SODIUM CHLORIDE 0.9% FLUSH: 3.0000 mL | INTRAVENOUS | Status: DC | PRN

## 2016-09-16 MED ORDER — IODIXANOL 320 MG/ML IV SOLN
INTRAVENOUS | Status: DC | PRN
  Administered 2016-09-16: 50 mL via INTRA_ARTERIAL
  Administered 2016-09-16: 100 mL via INTRA_ARTERIAL

## 2016-09-16 SURGICAL SUPPLY — 59 items
BAG BANDED W/RUBBER/TAPE 36X54 (MISCELLANEOUS) ×2 IMPLANT
BAG EQP BAND 135X91 W/RBR TAPE (MISCELLANEOUS) ×1
BLADE SURG 11 STRL SS (BLADE) ×2 IMPLANT
CANISTER SUCT 3000ML PPV (MISCELLANEOUS) ×2 IMPLANT
CATH ANGIO 5F BER2 65CM (CATHETERS) IMPLANT
CATH CROSS OVER TEMPO 5F (CATHETERS) ×3 IMPLANT
CATH OMNI FLUSH .035X70CM (CATHETERS) ×3 IMPLANT
CATH QUICKCROSS SUPP .035X90CM (MICROCATHETER) ×1 IMPLANT
CATH STRAIGHT 5FR 65CM (CATHETERS) ×1 IMPLANT
CHLORAPREP W/TINT 26ML (MISCELLANEOUS) ×2 IMPLANT
COVER BACK TABLE 80X110 HD (DRAPES) ×4 IMPLANT
COVER DOME SNAP 22 D (MISCELLANEOUS) ×2 IMPLANT
COVER PROBE W GEL 5X96 (DRAPES) ×3 IMPLANT
COVER SURGICAL LIGHT HANDLE (MISCELLANEOUS) ×2 IMPLANT
DEVICE INFLATION ENCORE 26 (MISCELLANEOUS) IMPLANT
DEVICE TORQUE KENDALL .025-038 (MISCELLANEOUS) ×1 IMPLANT
DRAPE BACK TABLE (DRAPES) ×1 IMPLANT
DRAPE FEMORAL ANGIO 80X135IN (DRAPES) ×2 IMPLANT
DRAPE HALF SHEET 40X57 (DRAPES) IMPLANT
DRSG TEGADERM 2-3/8X2-3/4 SM (GAUZE/BANDAGES/DRESSINGS) ×1 IMPLANT
DRSG TEGADERM 4X4.75 (GAUZE/BANDAGES/DRESSINGS) ×2 IMPLANT
ELECT REM PT RETURN 9FT ADLT (ELECTROSURGICAL)
ELECTRODE REM PT RTRN 9FT ADLT (ELECTROSURGICAL) IMPLANT
GAUZE SPONGE 2X2 8PLY STRL LF (GAUZE/BANDAGES/DRESSINGS) IMPLANT
GAUZE SPONGE 4X4 16PLY XRAY LF (GAUZE/BANDAGES/DRESSINGS) ×2 IMPLANT
GLIDEWIRE ANGLED SS 035X260CM (WIRE) IMPLANT
GLOVE BIO SURGEON STRL SZ7.5 (GLOVE) ×2 IMPLANT
GOWN STRL REUS W/ TWL LRG LVL3 (GOWN DISPOSABLE) ×2 IMPLANT
GOWN STRL REUS W/TWL LRG LVL3 (GOWN DISPOSABLE) ×4
GUIDEWIRE ANGLED .035X150CM (WIRE) ×1 IMPLANT
KIT BASIN OR (CUSTOM PROCEDURE TRAY) ×2 IMPLANT
KIT HEART LEFT (KITS) ×1 IMPLANT
KIT ROOM TURNOVER OR (KITS) ×2 IMPLANT
NDL HYPO 25GX1X1/2 BEV (NEEDLE) IMPLANT
NDL PERC 18GX7CM (NEEDLE) IMPLANT
NEEDLE HYPO 25GX1X1/2 BEV (NEEDLE) IMPLANT
NEEDLE PERC 18GX7CM (NEEDLE) ×2 IMPLANT
NS IRRIG 1000ML POUR BTL (IV SOLUTION) ×2 IMPLANT
PACK UNIVERSAL I (CUSTOM PROCEDURE TRAY) IMPLANT
PAD ARMBOARD 7.5X6 YLW CONV (MISCELLANEOUS) ×4 IMPLANT
PROTECTION STATION PRESSURIZED (MISCELLANEOUS) ×6
SET MICROPUNCTURE 5F STIFF (MISCELLANEOUS) ×2 IMPLANT
SHEATH AVANTI 11CM 5FR (MISCELLANEOUS) ×3 IMPLANT
SHEATH PINNACLE 6F 10CM (SHEATH) ×1 IMPLANT
SHEATH PINNACLE ST 7F 45CM (SHEATH) IMPLANT
SPONGE GAUZE 2X2 STER 10/PKG (GAUZE/BANDAGES/DRESSINGS) ×1
STATION PROTECTION PRESSURIZED (MISCELLANEOUS) ×1 IMPLANT
STOPCOCK MORSE 400PSI 3WAY (MISCELLANEOUS) ×3 IMPLANT
SYR 10ML LL (SYRINGE) ×4 IMPLANT
SYR 20CC LL (SYRINGE) ×4 IMPLANT
SYR 30ML LL (SYRINGE) ×4 IMPLANT
SYR CONTROL 10ML LL (SYRINGE) IMPLANT
SYR MEDRAD MARK V 150ML (SYRINGE) ×2 IMPLANT
TOWEL GREEN STERILE (TOWEL DISPOSABLE) ×2 IMPLANT
TUBING HIGH PRESSURE 120CM (CONNECTOR) ×3 IMPLANT
WATER STERILE IRR 1000ML POUR (IV SOLUTION) IMPLANT
WIRE BENTSON .035X145CM (WIRE) ×2 IMPLANT
WIRE HI TORQ VERSACORE J 260CM (WIRE) ×2 IMPLANT
WIRE ROSEN 145CM (WIRE) ×1 IMPLANT

## 2016-09-16 NOTE — Progress Notes (Signed)
VASCULAR SURGERY  The patient was scheduled for an arteriogram this morning in the operating room. The patient was refusing to sign her consent this morning so I spoke with her. She feels strongly that she does not wish to proceed with arteriography today. I will notify Dr. Randie Heinzain and Dr. Darrick PennaFields.  Waverly Ferrarihristopher Dickson, MD, FACS Beeper [phone] Office: [phone]

## 2016-09-16 NOTE — Interval H&P Note (Signed)
History and Physical Interval Note:  09/16/2016 9:24 AM  Andrea [name]  has presented today for surgery, with the diagnosis of Infected left groin wound  T81.4XX  The various methods of treatment have been discussed with the patient and family. After consideration of risks, benefits and other options for treatment, the patient has consented to  Procedure(s): AORTOGRAM WITH LEFT LOWER EXTREMITY RUNOFF (N/A) as a surgical intervention .  The patient's history has been reviewed, patient examined, no change in status, stable for surgery.  I have reviewed the patient's chart and labs.  Questions were answered to the patient's satisfaction.     Fabienne BrunsFields, Charles

## 2016-09-16 NOTE — Anesthesia Preprocedure Evaluation (Addendum)
Anesthesia Evaluation  Patient identified by MRN, date of birth, ID band Patient awake    Reviewed: Allergy & Precautions, NPO status , Patient's Chart, lab work & pertinent test results  History of Anesthesia Complications (+) PONV  Airway Mallampati: I  TM Distance: >3 FB Neck ROM: Full    Dental   Pulmonary shortness of breath and with exertion, former smoker,    Pulmonary exam normal        Cardiovascular hypertension, Pt. on medications + angina + CAD and + Peripheral Vascular Disease  Normal cardiovascular exam     Neuro/Psych    GI/Hepatic GERD  Controlled,  Endo/Other  diabetes, Poorly Controlled, Type 1  Renal/GU      Musculoskeletal  (+) Fibromyalgia -  Abdominal   Peds  Hematology   Anesthesia Other Findings   Reproductive/Obstetrics                            Anesthesia Physical Anesthesia Plan  ASA: III  Anesthesia Plan: MAC   Post-op Pain Management:    Induction: Intravenous  PONV Risk Score and Plan: 3 and Ondansetron and Treatment may vary due to age or medical condition  Airway Management Planned: Simple Face Mask  Additional Equipment:   Intra-op Plan:   Post-operative Plan:   Informed Consent: I have reviewed the patients History and Physical, chart, labs and discussed the procedure including the risks, benefits and alternatives for the proposed anesthesia with the patient or authorized representative who has indicated his/her understanding and acceptance.     Plan Discussed with: CRNA and Surgeon  Anesthesia Plan Comments:         Anesthesia Quick Evaluation

## 2016-09-16 NOTE — Progress Notes (Signed)
OT Cancellation Note  Patient Details Name: Andrea [name] MRN: [id number] DOB: March 25, 1950   Cancelled Treatment:    Reason Eval/Treat Not Completed: Patient at procedure or test/ unavailable (pt in surgery). Will continue to follow.  Andrea [name], Andrea [name] 09/16/2016, 9:22 AM  09/16/2016 Andrea [name], OTR/L Pager: [phone]

## 2016-09-16 NOTE — Progress Notes (Addendum)
Upon learning pt was scheduled for angiogram this AM, this RN attempted to obtain consent from pt for procedure. Pt, while disoriented to date and time is oriented to present situation, place, and self and so procedure was explained to pt but pt declines consent stating "I don't want to do anything else to my leg right now, I just want to rest". Pt educated on necessity of angiogram but still refuses. OR Consulting civil engineerCharge RN notified of pt's present decision.  OR Charge RN called back at [phone] and endorsed surgeon on-call should be notified of pt's refusal. Dr. Edilia Boickson paged and notified. Dr. Edilia Boickson endorsed he would inform Dr. Darrick PennaFields. OR Consulting civil engineerCharge RN called back and informed of above.

## 2016-09-16 NOTE — Progress Notes (Signed)
PT Cancellation Note  Patient Details Name: Andrea [name] B Sylva MRN: [id number] DOB: 04/28/1950   Cancelled Treatment:    Reason Eval/Treat Not Completed: Medical issues which prohibited therapy Pt is on bedrest following abdominal aortagram. PT will check back for treatment tomorrow. Thanks.  Thai Hemrick B. Beverely RisenVan Fleet PT, DPT Acute Rehabilitation  [phone] Pager [phone]  Elon Alaslizabeth B Van Fleet 09/16/2016, 1:35 PM

## 2016-09-16 NOTE — Progress Notes (Signed)
Pharmacy Antibiotic Note  Andrea [name] is a 66 y.o. 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  Pharmacy was consulted for Vancomycin and Zosyn dosing on 8/30. His Zosyn was discontinued due to culture data revealing MRSA.  ID has been following and suggest continuing Vanc while here then start Doxycycline to finish out her course at discharge. Vancomycin trough 15 at goal, Cr 1 stable  Plan: Continue Vancomycin 500 mg IV every 12 hours.  Goal trough 15-20 mcg/mL. Monitor clinical progress, cultures/sensitivities, renal function, abx plan    Height: 5' 2.01" (157.5 cm) Weight: 131 lb (59.4 kg) IBW/kg (Calculated) : 50.12  Temp (24hrs), Avg:97.7 F (36.5 C), Min:97 F (36.1 C), Max:98.7 F (37.1 C)   Recent Labs Lab 09/10/16 1450 09/10/16 1502 09/11/16 0434 09/12/16 0526 09/15/16 0848 09/16/16 1805  WBC 9.1  --  8.3 8.5 13.1*  --   CREATININE  --  1.51* 1.04* 0.78 0.99  --   VANCOTROUGH  --   --   --   --   --  15    Estimated Creatinine Clearance: 46 mL/min (by C-G formula based on SCr of 0.99 mg/dL).    Allergies  Allergen Reactions  . Naproxen Itching  . Tramadol Hcl Itching   Antimicrobials this admission: 8/30 cefuroxime >> x1 intra-op 8/30 vanc >>  8/30 Zosyn >>9/1  Dose adjustments this admission:  Microbiology results: 8/30 wound >> MRSA  Andrea [name] Pharm.D. CPP, BCPS Clinical Pharmacist [phone] 09/16/2016 7:11 PM

## 2016-09-16 NOTE — Progress Notes (Signed)
Late entry: assumed care from off going RN; patient was confused prior to shift but patient has been re-oriented back to person, place, and day; MD rounded and consent sign for OR; @ 0859 am patient went down to OR and back to room via bed; patient resting; no signs of acute distress; PCA still in placed; family updated; report given to on coming RN;

## 2016-09-16 NOTE — H&P (View-Only) (Signed)
[facility] [place]  Subjective  - Doing well over all.   Objective (!) 123/57 64 98 F (36.7 C) (Oral) 17 100%  Intake/Output Summary (Last 24 hours) at 09/15/16 0727 Last data filed at 09/15/16 0700  Gross per 24 hour  Intake              760 ml  Output              550 ml  Net              210 ml    Doppler DP B Left leg incisions healing well Groin base mixed beefy red with yellow eschar.  Wet to dry packed guaze.  Assessment/Planning: POD # 3  65 y.o. female is s/p: Washout and partial closure of left groin wound, closure of left thigh and below knee wounds (09/12/2016), left fem-pop graft removal and patch angioplasty of left common femoral and below knee popliteal artery (09/10/16)  No change in dry gangrene left great toe, foot appears viable. 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 Currently on vancomycin. ID has seen. Plastic surgery has seen. Will stay on IV vanc while inpatient.   [name], Andrea Andrea [name] 09/15/2016 7:27 AM --  Laboratory Lab Results: No results for input(s): WBC, HGB, HCT, PLT in the last 72 hours. BMET No results for input(s): NA, K, CL, CO2, GLUCOSE, BUN, CREATININE, CALCIUM in the last 72 hours.  COAG Lab Results  Component Value Date   INR 1.09 09/10/2016   INR 0.93 07/16/2016   INR 1.10 06/12/2016   No results found for: PTT  I have independently interviewed patient and agree with PA assessment and plan above. Angiogram tomorrow to plan flap coverage of left groin and future bypass possibilites with gangrene of left great toe. Npo past midnight. ID and plastic surgery on board and assistance appreciated.   Andrea C. Cain, MD [facility] [place] Office: [phone] Pager: [phone]    

## 2016-09-16 NOTE — Op Note (Signed)
Procedure: Abdominal aortogram with bilateral lower extremity runoff, second order catheterization left external iliac artery, ultrasound right groin  Preoperative diagnosis: Nonhealing wounds bilateral feet  Postoperative diagnosis: Same  Anesthesia: Local with IV sedation  Operative findings: #1 patent aortoiliac system #2 patent bilateral renal arteries #3 left superficial femoral artery occlusion with reconstitution of the below-knee popliteal artery and two-vessel runoff, patent left profunda but diseased #4 patent right femoral to below-knee popliteal bypass with two-vessel runoff to the right foot  Operative details: After obtaining informed consent, the patient was taken the operating room. The patient was placed in supine position on the operating table. After adequate sedation, agent right groin was prepped and draped in usual sterile fashion. Local anesthesia was inserted over the right common femoral artery. Ultrasound was used to identify the right common femoral artery. A micropuncture needle was used to cannulate the right common femoral artery. However I was not able to get the guidewire to advance past the common femoral artery as there was some friction in this area. The needle was removed and hemostasis obtained with direct pressure. I then utilized a standard introducer needle and was able to cannulate the common femoral artery without difficulty. An 035 Rosen wire initially met some resistance in the common femoral artery but then I was able to advance this up in the abdominal aorta without difficulty. The patient had significant scar tissue in the right groin. I initially dilated the tract with a 5 [place] dilator. I then proceeded to place a 6 [place] dilator. And then I was able after making a small nick in the skin to advance the 6 French sheath. This was thoroughly flushed with heparinized saline. A 5 French Omni Flush catheter was then advanced over this and the abdominal aorta.  Abdominal aortogram was obtained in AP projection. The left and right renal arteries are widely patent. The infrarenal abdominal aorta is widely patent. It does taper slightly at the aortic bifurcation. The left and right common iliac arteries are patent. The left, internal iliac arteries are patent but small. The left and right external iliac arteries are patent although there is some suggestion of calcific disease in the right common femoral artery although this does not seem to cause significant flow-limiting stenosis. At this point he Omni Flush catheter was brought down above the aortic bifurcation and bilateral oblique views of the pelvis were obtained which confirmed the above findings. Additionally the left profunda femoris artery is patent but diseased with multiple segments of 25-30% narrowing diffusely throughout its course. The native left superficial femoral artery and bypass graft on the left side is occluded. At this point the Omni Flush catheter was brought down near the aortic bifurcation and using the [facility] wire I was able to selectively catheterize the left common iliac followed by the external leg artery. He Omni Flush catheter was then removed and a 5 [place] straight catheter placed over this and the left lower extremity arteriogram obtained through the 5 French straight catheter in the distal left external iliac artery. The left common femoral artery is patent. The left profunda femoris is patent but diseased as mentioned above. Left superficial femoral artery native and bypass graft are both occluded. There is reconstitution of the below-knee popliteal artery via profunda collaterals. There is two-vessel runoff to the left foot. The 5 French straight catheter was then removed and a right lower external arteriogram was obtained through the sheath in the right groin. The right common femoral artery is patent. Needle  puncture site is just above the proximal anastomosis of a right femoral to  below-knee popliteal bypass. There is some tortuosity in the thigh. The profunda femoris and the right thigh is also widely patent. The distal anastomosis to the below-knee popliteal artery is patent. There is two-vessel runoff to the right foot. At this point the sheath was removed and hemostasis obtained with direct pressure. The patient started the procedure well and there were no palpitations. The patient was taken to recovery room in stable condition.  Operative management: These films will be reviewed with Dr. Donzetta [name] and Dr. Leland [name] for definitive management with possibility of revascularization and muscle flap placement.  Andrea Hinds, MD [facility] [place] Office: [phone] Pager: [phone]

## 2016-09-16 NOTE — Anesthesia Postprocedure Evaluation (Signed)
Anesthesia Post Note  Patient: Andrea [name]  Procedure(s) Performed: Procedure(s) (LRB): AORTOGRAM WITH LEFT LOWER EXTREMITY RUNOFF (N/A)     Patient location during evaluation: PACU Anesthesia Type: MAC Level of consciousness: awake and alert Pain management: pain level controlled Vital Signs Assessment: post-procedure vital signs reviewed and stable Respiratory status: spontaneous breathing, nonlabored ventilation, respiratory function stable and patient connected to nasal cannula oxygen Cardiovascular status: stable and blood pressure returned to baseline Anesthetic complications: no    Last Vitals:  Vitals:   09/16/16 1300 09/16/16 1438  BP:  (!) 115/49  Pulse: (!) 53 63  Resp: 17 18  Temp:  37.1 C  SpO2: 95% 98%    Last Pain:  Vitals:   09/16/16 1438  TempSrc: Oral  PainSc: 9                  Caasi Giglia DAVID

## 2016-09-16 NOTE — Anesthesia Procedure Notes (Addendum)
Procedure Name: MAC Date/Time: 09/16/2016 9:50 AM Performed by: Ebbie Latus E Oxygen Delivery Method: Simple face mask Placement Confirmation: positive ETCO2

## 2016-09-16 NOTE — Progress Notes (Signed)
  Progress Note    09/16/2016 3:37 PM Day of Surgery  Subjective:  No new issues today  Vitals:   09/16/16 1438 09/16/16 1526  BP: (!) 115/49 (!) 102/49  Pulse: 63 60  Resp: 18 16  Temp: 98.7 F (37.1 C) 98.4 F (36.9 C)  SpO2: 98% 94%    Physical Exam: [email or web address] Left groin incison is clean Leg incisions cdi with staples Left foot is warm with weak peroneal signal only Right foot strong dp signal  CBC    Component Value Date/Time   WBC 13.1 (H) 09/15/2016 0848   RBC 3.71 (L) 09/15/2016 0848   HGB 10.6 (L) 09/15/2016 0848   HGB 10.8 (L) 07/01/2016 1445   HCT 32.9 (L) 09/15/2016 0848   HCT 32.9 (L) 07/01/2016 1445   PLT 333 09/15/2016 0848   PLT 340 07/01/2016 1445   MCV 88.7 09/15/2016 0848   MCV 88 07/01/2016 1445   MCH 28.6 09/15/2016 0848   MCHC 32.2 09/15/2016 0848   RDW 14.4 09/15/2016 0848   RDW 15.5 (H) 07/01/2016 1445   LYMPHSABS 3.0 07/01/2016 1445   MONOABS 0.3 05/25/2016 0349   EOSABS 0.4 07/01/2016 1445   BASOSABS 0.0 07/01/2016 1445    BMET    Component Value Date/Time   NA 141 09/15/2016 0848   NA 140 04/14/2016 1431   K 3.2 (L) 09/15/2016 0848   CL 101 09/15/2016 0848   CO2 29 09/15/2016 0848   GLUCOSE 80 09/15/2016 0848   BUN 23 (H) 09/15/2016 0848   BUN 19 04/14/2016 1431   CREATININE 0.99 09/15/2016 0848   CALCIUM 8.6 (L) 09/15/2016 0848   GFRNONAA 59 (L) 09/15/2016 0848   GFRAA >60 09/15/2016 0848    INR    Component Value Date/Time   INR 1.09 09/10/2016 1450     Intake/Output Summary (Last 24 hours) at 09/16/16 1537 Last data filed at 09/16/16 1500  Gross per 24 hour  Intake             3765 ml  Output               51 ml  Net             3714 ml     Assessment:  POD # 3  66 y.o.femaleis s/p: Washout and partial closure of left groin wound, closure of left thigh and below knee wounds (09/12/2016), left fem-pop graft removal and patch angioplasty of left common femoral and below knee popliteal artery (09/10/16) Aortogram  with runoff today  Plan:  flap coverage with plastic surgery possibly Friday Will need bypass to salvage left leg   Jadiel Schmieder C. Randie Heinzain, MD [facility] [place] Office: [phone] Pager: [phone]  09/16/2016 3:37 PM

## 2016-09-16 NOTE — Transfer of Care (Signed)
Immediate Anesthesia Transfer of Care Note  Patient: Andrea [name]  Procedure(s) Performed: Procedure(s): AORTOGRAM WITH LEFT LOWER EXTREMITY RUNOFF (N/A)  Patient Location: PACU  Anesthesia Type:MAC  Level of Consciousness: awake, alert , oriented and sedated  Airway & Oxygen Therapy: Patient Spontanous Breathing and Patient connected to face mask oxygen  Post-op Assessment: Report given to RN, Post -op Vital signs reviewed and stable and Patient moving all extremities  Post vital signs: Reviewed and stable  Last Vitals:  Vitals:   09/16/16 0800 09/16/16 0900  BP:    Pulse: 75 73  Resp: 18   Temp:    SpO2: (!) 88% 94%    Last Pain:  Vitals:   09/16/16 0426  TempSrc: Oral  PainSc: 0-No pain      Patients Stated Pain Goal: 0 (05/39/76 7341)  Complications: No apparent anesthesia complications

## 2016-09-17 ENCOUNTER — Encounter (HOSPITAL_COMMUNITY): Payer: Self-pay | Admitting: Vascular Surgery

## 2016-09-17 LAB — GLUCOSE, CAPILLARY
GLUCOSE-CAPILLARY: 165 mg/dL — AB (ref 65–99)
Glucose-Capillary: 184 mg/dL — ABNORMAL HIGH (ref 65–99)
Glucose-Capillary: 77 mg/dL (ref 65–99)

## 2016-09-17 NOTE — Progress Notes (Signed)
Occupational Therapy Treatment Patient Details Name: Andrea [name] B Hamed MRN: [id number] DOB: 1950-10-27 Today's Date: 09/17/2016    History of present illness 66 year old female s/p redo Lt fempop BPG with I&D 8/30, partial closure and washout 9/1. PMHx: Lt fem-below knee popliteal artery BPG and Lt fem endarterectomy  07/20/16, CABG, fibromyalgia, urinary incontinence, right fem-below knee popliteal artery BPG with prior infection and debridement   OT comments  This 66 yo female admitted with above presents to acute OT with decreased stability when up on her feet than when last seen, but very willing to work with me. She will continue to benefit from acute OT with follow up OT at SNF.   Follow Up Recommendations  SNF;Supervision/Assistance - 24 hour    Equipment Recommendations  None recommended by OT       Precautions / Restrictions Precautions Precautions: Fall Restrictions Weight Bearing Restrictions: No       Mobility Bed Mobility Overal bed mobility: Needs Assistance Bed Mobility: Supine to Sit     Supine to sit: Supervision     General bed mobility comments: min A to scoot to EOB  Transfers Overall transfer level: Needs assistance Equipment used: Rolling walker (2 wheeled) Transfers: Sit to/from Stand Sit to Stand: Mod assist              Balance Overall balance assessment: Needs assistance Sitting-balance support: No upper extremity supported;Feet supported Sitting balance-Leahy Scale: Fair       Standing balance-Leahy Scale: Poor Standing balance comment: needed Bil UE  support and additional support from me in static standing                           ADL either performed or assessed with clinical judgement   ADL Overall ADL's : Needs assistance/impaired     Grooming: Oral care;Set up;Supervision/safety;Sitting Grooming Details (indicate cue type and reason): Pt able to ambulate to sink with RW and Mod (posterior bias), but unable to  stand and brush teeth due to balance                 Toilet Transfer: Moderate assistance;Ambulation;RW Toilet Transfer Details (indicate cue type and reason): bed>sink to sit on 3n1 for brushing teeth                 Vision Patient Visual Report: No change from baseline            Cognition Arousal/Alertness: Awake/alert Behavior During Therapy: WFL for tasks assessed/performed Overall Cognitive Status: Impaired/Different from baseline Area of Impairment: Safety/judgement;Problem solving                         Safety/Judgement: Decreased awareness of safety   Problem Solving: Slow processing;Decreased initiation;Difficulty sequencing;Requires tactile cues;Requires verbal cues General Comments: Did not think the pocketbook in bed with her was hers, not sure where it came from she said, she finally realized it was hers                   Pertinent Vitals/ Pain       Pain Assessment: 0-10 Pain Score: 6  Pain Location: LLE with WB'ing Pain Descriptors / Indicators: Sore Pain Intervention(s): Limited activity within patient's tolerance;Repositioned         Frequency  Min 2X/week        Progress Toward Goals  OT Goals(current goals can now be found in the care plan section)  Progress towards  OT goals:  (not doing as well today when up on her feet)     Plan Discharge plan remains appropriate       AM-PAC PT "6 Clicks" Daily Activity     Outcome Measure   Help from another person eating meals?: None Help from another person taking care of personal grooming?: A Little Help from another person toileting, which includes using toliet, bedpan, or urinal?: A Lot Help from another person bathing (including washing, rinsing, drying)?: A Lot Help from another person to put on and taking off regular upper body clothing?: A Little Help from another person to put on and taking off regular lower body clothing?: A Lot 6 Click Score: 16    End of  Session Equipment Utilized During Treatment: Gait belt;Rolling walker  OT Visit Diagnosis: Unsteadiness on feet (R26.81);History of falling (Z91.81);Pain Pain - Right/Left: Left Pain - part of body: Leg   Activity Tolerance Patient tolerated treatment well   Patient Left in bed;with call bell/phone within reach;with bed alarm set   Nurse Communication          Time: 1610-9604 OT Time Calculation (min): 32 min  Charges: OT General Charges $OT Visit: 1 Visit OT Treatments $Self Care/Home Management : 23-37 mins  Ignacia Palma, OTR/L [phone] 09/17/2016

## 2016-09-17 NOTE — Progress Notes (Addendum)
[facility]  Subjective  - Doing well over all.  No new complaints.   Objective (!) 168/59 (!) 57 98.3 F (36.8 C) (Oral) 17 91%  Intake/Output Summary (Last 24 hours) at 09/17/16 0851 Last data filed at 09/17/16 0547  Gross per 24 hour  Intake             1040 ml  Output               51 ml  Net              989 ml    Left groin dressing changed wet to dry.  Wound appears healthy with beefy red base. 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 Leta Baptisthimmappa plans OR tomorrow for attempt at gracilis flap left groin.  Dr. Randie Heinzain will follow progress and hope to plan bypass to salvage left leg at some point.  Active range of motion preserved in left Foot, no wound changes and weak doppler signal left DP   COLLINS, EMMA [name] 09/17/2016 8:51 AM --  Laboratory Lab Results:  Recent Labs  09/15/16 0848  WBC 13.1*  HGB 10.6*  HCT 32.9*  PLT 333   BMET  Recent Labs  09/15/16 0848  NA 141  K 3.2*  CL 101  CO2 29  GLUCOSE 80  BUN 23*  CREATININE 0.99  CALCIUM 8.6*    COAG Lab Results  Component Value Date   INR 1.09 09/10/2016   INR 0.93 07/16/2016   INR 1.10 06/12/2016   No results found for: PTT   I have independently interviewed patient and agree with PA assessment and plan above.   Darcee Dekker C. Randie Heinzain, MD [facility] [place] Office: [phone] Pager: [phone]

## 2016-09-17 NOTE — Anesthesia Postprocedure Evaluation (Signed)
Anesthesia Post Note  Patient: Andrea [name]  Procedure(s) Performed: Procedure(s) (LRB): Excision of Left Femoral-Popliteal Graft, Placement of Antibiotic Beads Left Leg (Left) Bovine PATCH ANGIOPLASTY Left Femoral Artery and Below Knee Popliteal Artery (Left)     Patient location during evaluation: PACU Anesthesia Type: General Level of consciousness: awake and patient cooperative Pain management: pain level controlled Vital Signs Assessment: post-procedure vital signs reviewed and stable Respiratory status: spontaneous breathing, nonlabored ventilation, respiratory function stable and patient connected to nasal cannula oxygen Cardiovascular status: blood pressure returned to baseline and stable Postop Assessment: no signs of nausea or vomiting Anesthetic complications: no    Last Vitals:  Vitals:   09/17/16 0000 09/17/16 0400  BP: (!) 161/54 (!) 168/59  Pulse: 65 (!) 57  Resp: 16 17  Temp: 36.9 C 36.8 C  SpO2: 92% 91%    Last Pain:  Vitals:   09/17/16 0530  TempSrc:   PainSc: 0-No pain                 Shahrukh Pasch

## 2016-09-17 NOTE — Progress Notes (Signed)
Transferred care to oncoming nurse. Bedside report given.

## 2016-09-17 NOTE — Progress Notes (Addendum)
   Plastic Surgery  Angiogram discussed with Dr. Randie Heinzain. Will proceed with attempt at gracilis flap though given her severe PVD, if pedicle flap does not have flow intraoperatively may need to abort procedure. Surgery scheduled for 9.7.18, patient NPO past MN.  Will see patient this pm to review surgery.  Glenna FellowsBrinda Egan Berkheimer, MD [facility] Plastic & Reconstructive Surgery [phone]  ADDENDUM 9.6.18 [phone]  Spoke with patient and reviewed surgery incisions, use VAC, drains. Reviewed if intraop blood flow concerns noted need to abort. Desires to proceed.

## 2016-09-17 NOTE — Progress Notes (Signed)
Physical Therapy Treatment Patient Details Name: Andrea [name] MRN: [id number] DOB: [birth year] Today's Date: 09/17/2016    History of Present Illness 66 year old female s/p redo Lt fempop BPG with I&D 8/30, partial closure and washout 9/1. PMHx: Lt fem-below knee popliteal artery BPG and Lt fem endarterectomy  07/20/16, CABG, fibromyalgia, urinary incontinence, right fem-below knee popliteal artery BPG with prior infection and debridement    PT Comments    Patient required mod A for safe OOB mobility due to posterior bias in standing. Pt drowsy suspect due to PCA. Continue to progress as tolerated with anticipated d/c to SNF for further skilled PT services.     Follow Up Recommendations  SNF;Supervision for mobility/OOB     Equipment Recommendations  None recommended by PT    Recommendations for Other Services OT consult     Precautions / Restrictions Precautions Precautions: Fall Precaution Comments: pt reports several recent falls Restrictions Weight Bearing Restrictions: No    Mobility  Bed Mobility Overal bed mobility: Needs Assistance Bed Mobility: Supine to Sit;Sit to Supine     Supine to sit: Supervision Sit to supine: Supervision   General bed mobility comments: supervision for safety  Transfers Overall transfer level: Needs assistance Equipment used: Rolling walker (2 wheeled) Transfers: Sit to/from Stand Sit to Stand: Mod assist;Max assist         General transfer comment: assist to power up into standing and assist for balance upon stand; max A initially to gain balance and mod A to maintain balance due to posterior bias; multimodal cues for posture    Ambulation/Gait Ambulation/Gait assistance: Mod assist Ambulation Distance (Feet): 20 Feet Assistive device: Rolling walker (2 wheeled) Gait Pattern/deviations: Trunk flexed;Step-through pattern;Decreased step length - right;Decreased step length - left     General Gait Details: assist to balance  and management of RW; cues for posture and directional cues to navigate room   Stairs            Wheelchair Mobility    Modified Rankin (Stroke Patients Only)       Balance Overall balance assessment: Needs assistance Sitting-balance support: No upper extremity supported;Feet supported Sitting balance-Leahy Scale: Fair     Standing balance support: Bilateral upper extremity supported Standing balance-Leahy Scale: Poor Standing balance comment: needed Bil UE  support and additional support from me in static standing                            Cognition Arousal/Alertness: Lethargic;Suspect due to medications (easily arousable) Behavior During Therapy: WFL for tasks assessed/performed (drowsy) Overall Cognitive Status: Impaired/Different from baseline Area of Impairment: Safety/judgement;Problem solving                         Safety/Judgement: Decreased awareness of safety   Problem Solving: Slow processing;Decreased initiation;Difficulty sequencing;Requires tactile cues;Requires verbal cues General Comments: pt was drowsy but arousable and following all commands      Exercises      General Comments        Pertinent Vitals/Pain Pain Assessment: No/denies pain Pain Score: 6  Pain Location: LLE with WB'ing Pain Descriptors / Indicators: Sore Pain Intervention(s): Monitored during session;Premedicated before session    Home Living                      Prior Function            PT Goals (current goals can now  be found in the care plan section) Acute Rehab PT Goals PT Goal Formulation: With patient Time For Goal Achievement: 09/25/16 Potential to Achieve Goals: Fair Progress towards PT goals: Progressing toward goals    Frequency    Min 3X/week      PT Plan Current plan remains appropriate    Co-evaluation              AM-PAC PT "6 Clicks" Daily Activity  Outcome Measure  Difficulty turning over in bed  (including adjusting bedclothes, sheets and blankets)?: A Lot Difficulty moving from lying on back to sitting on the side of the bed? : A Lot Difficulty sitting down on and standing up from a chair with arms (e.g., wheelchair, bedside commode, etc,.)?: Unable Help needed moving to and from a bed to chair (including a wheelchair)?: A Lot Help needed walking in hospital room?: A Lot Help needed climbing 3-5 steps with a railing? : Total 6 Click Score: 10    End of Session Equipment Utilized During Treatment: Gait belt Activity Tolerance: Patient tolerated treatment well Patient left: in bed;with call bell/phone within reach;with bed alarm set Nurse Communication: Mobility status PT Visit Diagnosis: Difficulty in walking, not elsewhere classified (R26.2);History of falling (Z91.81)     Time: [id number] PT Time Calculation (min) (ACUTE ONLY): 30 min  Charges:  $Gait Training: 8-22 mins $Therapeutic Activity: 8-22 mins                    G Codes:       Erline LevineKellyn Trenell Concannon, PTA Pager: [phone]     Carolynne EdouardKellyn R Norena Bratton 09/17/2016, 4:23 PM

## 2016-09-18 ENCOUNTER — Encounter (HOSPITAL_COMMUNITY): Payer: Self-pay | Admitting: Orthopedic Surgery

## 2016-09-18 ENCOUNTER — Inpatient Hospital Stay (HOSPITAL_COMMUNITY): Payer: BLUE CROSS/BLUE SHIELD | Admitting: Certified Registered Nurse Anesthetist

## 2016-09-18 ENCOUNTER — Encounter (HOSPITAL_COMMUNITY)
Admission: AD | Disposition: A | Discharge status: Skilled Nursing Facility | Payer: Self-pay | Source: Ambulatory Visit | Attending: Vascular Surgery

## 2016-09-18 HISTORY — PX: MUSCLE FLAP CLOSURE: SHX2054

## 2016-09-18 LAB — GLUCOSE, CAPILLARY
GLUCOSE-CAPILLARY: 91 mg/dL (ref 65–99)
GLUCOSE-CAPILLARY: 59 mg/dL — AB (ref 65–99)
Glucose-Capillary: 97 mg/dL (ref 65–99)
Glucose-Capillary: 85 mg/dL (ref 65–99)
Glucose-Capillary: 63 mg/dL — ABNORMAL LOW (ref 65–99)

## 2016-09-18 LAB — BASIC METABOLIC PANEL
Anion gap: 12 (ref 5–15)
BUN: 14 mg/dL (ref 6–20)
CO2: 31 mmol/L (ref 22–32)
Calcium: 9.3 mg/dL (ref 8.9–10.3)
Chloride: 100 mmol/L — ABNORMAL LOW (ref 101–111)
Creatinine, Ser: 0.8 mg/dL (ref 0.44–1.00)
Glucose, Bld: 90 mg/dL (ref 65–99)
POTASSIUM: 3.1 mmol/L — AB (ref 3.5–5.1)
Sodium: 143 mmol/L (ref 135–145)

## 2016-09-18 SURGERY — CLOSURE, WOUND, USING MUSCLE FLAP
Anesthesia: General | Laterality: Left

## 2016-09-18 MED ORDER — SUGAMMADEX SODIUM 200 MG/2ML IV SOLN
INTRAVENOUS | Status: DC | PRN
  Administered 2016-09-18: 125 mg via INTRAVENOUS

## 2016-09-18 MED ORDER — ROCURONIUM BROMIDE 100 MG/10ML IV SOLN
INTRAVENOUS | Status: DC | PRN
  Administered 2016-09-18: 50 mg via INTRAVENOUS
  Administered 2016-09-18: 30 mg via INTRAVENOUS

## 2016-09-18 MED ORDER — SODIUM CHLORIDE 0.9 % IV SOLN
INTRAVENOUS | Status: DC | PRN
  Administered 2016-09-18: 500 mL

## 2016-09-18 MED ORDER — HYDROMORPHONE HCL 1 MG/ML IJ SOLN
0.2500 mg | INTRAMUSCULAR | Status: DC | PRN
  Administered 2016-09-18 (×2): 0.5 mg via INTRAVENOUS

## 2016-09-18 MED ORDER — PHENYLEPHRINE HCL 10 MG/ML IJ SOLN
INTRAMUSCULAR | Status: DC | PRN
  Administered 2016-09-18: 20 ug/min via INTRAVENOUS

## 2016-09-18 MED ORDER — EPHEDRINE SULFATE 50 MG/ML IJ SOLN
INTRAMUSCULAR | Status: DC | PRN
  Administered 2016-09-18 (×2): 5 mg via INTRAVENOUS

## 2016-09-18 MED ORDER — ROCURONIUM BROMIDE 10 MG/ML (PF) SYRINGE
PREFILLED_SYRINGE | INTRAVENOUS | Status: AC
  Filled 2016-09-18: qty 5

## 2016-09-18 MED ORDER — 0.9 % SODIUM CHLORIDE (POUR BTL) OPTIME
TOPICAL | Status: DC | PRN
  Administered 2016-09-18: 2000 mL

## 2016-09-18 MED ORDER — LACTATED RINGERS IV SOLN
INTRAVENOUS | Status: DC
  Administered 2016-09-18: 12:00:00 via INTRAVENOUS

## 2016-09-18 MED ORDER — LIDOCAINE 2% (20 MG/ML) 5 ML SYRINGE
INTRAMUSCULAR | Status: AC
  Filled 2016-09-18: qty 5

## 2016-09-18 MED ORDER — MIDAZOLAM HCL 2 MG/2ML IJ SOLN
INTRAMUSCULAR | Status: AC
  Filled 2016-09-18: qty 2

## 2016-09-18 MED ORDER — EPHEDRINE 5 MG/ML INJ
INTRAVENOUS | Status: AC
  Filled 2016-09-18: qty 10

## 2016-09-18 MED ORDER — SUGAMMADEX SODIUM 200 MG/2ML IV SOLN
INTRAVENOUS | Status: AC
  Filled 2016-09-18: qty 2

## 2016-09-18 MED ORDER — LIDOCAINE HCL (CARDIAC) 20 MG/ML IV SOLN
INTRAVENOUS | Status: DC | PRN
  Administered 2016-09-18: 50 mg via INTRATRACHEAL
  Administered 2016-09-18: 50 mg via INTRAVENOUS

## 2016-09-18 MED ORDER — ONDANSETRON HCL 4 MG/2ML IJ SOLN
INTRAMUSCULAR | Status: AC
  Filled 2016-09-18: qty 2

## 2016-09-18 MED ORDER — HYDROMORPHONE HCL 1 MG/ML IJ SOLN
INTRAMUSCULAR | Status: AC
  Administered 2016-09-18: 0.5 mg via INTRAVENOUS
  Filled 2016-09-18: qty 1

## 2016-09-18 MED ORDER — FENTANYL CITRATE (PF) 250 MCG/5ML IJ SOLN
INTRAMUSCULAR | Status: AC
  Filled 2016-09-18: qty 5

## 2016-09-18 MED ORDER — PROPOFOL 10 MG/ML IV BOLUS
INTRAVENOUS | Status: DC | PRN
Start: 2016-09-18 — End: 2016-09-18
  Administered 2016-09-18: 160 mg via INTRAVENOUS

## 2016-09-18 MED ORDER — SUCCINYLCHOLINE CHLORIDE 200 MG/10ML IV SOSY
PREFILLED_SYRINGE | INTRAVENOUS | Status: AC
  Filled 2016-09-18: qty 10

## 2016-09-18 MED ORDER — SODIUM CHLORIDE 0.45 % IV SOLN
INTRAVENOUS | Status: DC
  Administered 2016-09-18 – 2016-09-23 (×5): via INTRAVENOUS

## 2016-09-18 MED ORDER — PHENYLEPHRINE 40 MCG/ML (10ML) SYRINGE FOR IV PUSH (FOR BLOOD PRESSURE SUPPORT)
PREFILLED_SYRINGE | INTRAVENOUS | Status: AC
  Filled 2016-09-18: qty 10

## 2016-09-18 MED ORDER — GLYCOPYRROLATE 0.2 MG/ML IJ SOLN
INTRAMUSCULAR | Status: DC | PRN
  Administered 2016-09-18: 0.2 mg via INTRAVENOUS

## 2016-09-18 MED ORDER — METOCLOPRAMIDE HCL 5 MG/ML IJ SOLN: 10.0000 mg | Freq: Once | INTRAMUSCULAR | Status: DC | PRN

## 2016-09-18 MED ORDER — PHENYLEPHRINE HCL 10 MG/ML IJ SOLN
INTRAMUSCULAR | Status: DC | PRN
  Administered 2016-09-18: 40 ug via INTRAVENOUS
  Administered 2016-09-18: 160 ug via INTRAVENOUS
  Administered 2016-09-18: 120 ug via INTRAVENOUS

## 2016-09-18 MED ORDER — ONDANSETRON HCL 4 MG/2ML IJ SOLN
INTRAMUSCULAR | Status: DC | PRN
  Administered 2016-09-18: 4 mg via INTRAVENOUS

## 2016-09-18 MED ORDER — DEXAMETHASONE SODIUM PHOSPHATE 10 MG/ML IJ SOLN
INTRAMUSCULAR | Status: AC
  Filled 2016-09-18: qty 1

## 2016-09-18 MED ORDER — PROPOFOL 10 MG/ML IV BOLUS
INTRAVENOUS | Status: AC
  Filled 2016-09-18: qty 20

## 2016-09-18 MED ORDER — FENTANYL CITRATE (PF) 100 MCG/2ML IJ SOLN
INTRAMUSCULAR | Status: DC | PRN
  Administered 2016-09-18 (×2): 50 ug via INTRAVENOUS
  Administered 2016-09-18: 25 ug via INTRAVENOUS
  Administered 2016-09-18: 50 ug via INTRAVENOUS
  Administered 2016-09-18 (×2): 25 ug via INTRAVENOUS
  Administered 2016-09-18: 100 ug via INTRAVENOUS
  Administered 2016-09-18: 50 ug via INTRAVENOUS
  Administered 2016-09-18: 25 ug via INTRAVENOUS

## 2016-09-18 SURGICAL SUPPLY — 78 items
ADH SKN CLS APL DERMABOND .7 (GAUZE/BANDAGES/DRESSINGS)
APPLIER CLIP 9.375 MED OPEN (MISCELLANEOUS) ×4
APR CLP MED 9.3 20 MLT OPN (MISCELLANEOUS) ×2
BAG DECANTER FOR FLEXI CONT (MISCELLANEOUS) IMPLANT
BLADE SURG 10 STRL SS (BLADE) ×2 IMPLANT
BLADE SURG 15 STRL LF DISP TIS (BLADE) ×1 IMPLANT
BLADE SURG 15 STRL SS (BLADE) ×2
BNDG COHESIVE 4X5 TAN STRL (GAUZE/BANDAGES/DRESSINGS) IMPLANT
CANISTER SUCT 3000ML PPV (MISCELLANEOUS) ×2 IMPLANT
CHLORAPREP W/TINT 26ML (MISCELLANEOUS) ×2 IMPLANT
CLIP APPLIE 9.375 MED OPEN (MISCELLANEOUS) ×1 IMPLANT
COVER MAYO STAND STRL (DRAPES) IMPLANT
COVER SURGICAL LIGHT HANDLE (MISCELLANEOUS) ×2 IMPLANT
DERMABOND ADVANCED (GAUZE/BANDAGES/DRESSINGS)
DERMABOND ADVANCED .7 DNX12 (GAUZE/BANDAGES/DRESSINGS) ×2 IMPLANT
DRAIN CHANNEL 15F RND FF W/TCR (WOUND CARE) ×2 IMPLANT
DRAIN CHANNEL 19F RND (DRAIN) ×2 IMPLANT
DRAIN PENROSE 1X12 LTX STRL (DRAIN) ×2 IMPLANT
DRAPE HALF SHEET 40X57 (DRAPES) ×3 IMPLANT
DRAPE INCISE 23X17 IOBAN STRL (DRAPES)
DRAPE INCISE 23X17 STRL (DRAPES) IMPLANT
DRAPE INCISE IOBAN 23X17 STRL (DRAPES) IMPLANT
DRAPE INCISE IOBAN 85X60 (DRAPES) ×1 IMPLANT
DRAPE ORTHO SPLIT 77X108 STRL (DRAPES) ×4
DRAPE SURG ORHT 6 SPLT 77X108 (DRAPES) ×4 IMPLANT
DRAPE WARM FLUID 44X44 (DRAPE) ×2 IMPLANT
DRSG MEPILEX BORDER 4X8 (GAUZE/BANDAGES/DRESSINGS) ×1 IMPLANT
DRSG PAD ABDOMINAL 8X10 ST (GAUZE/BANDAGES/DRESSINGS) ×2 IMPLANT
DRSG VAC ATS LRG SENSATRAC (GAUZE/BANDAGES/DRESSINGS) ×2 IMPLANT
ELECT BLADE 4.0 EZ CLEAN MEGAD (MISCELLANEOUS) ×2
ELECT BLADE 6.5 EXT (BLADE) ×1 IMPLANT
ELECT COATED BLADE 2.86 ST (ELECTRODE) ×4 IMPLANT
ELECT REM PT RETURN 9FT ADLT (ELECTROSURGICAL) ×2
ELECTRODE BLDE 4.0 EZ CLN MEGD (MISCELLANEOUS) ×1 IMPLANT
ELECTRODE REM PT RTRN 9FT ADLT (ELECTROSURGICAL) ×1 IMPLANT
EVACUATOR SILICONE 100CC (DRAIN) ×4 IMPLANT
GAUZE XEROFORM 5X9 LF (GAUZE/BANDAGES/DRESSINGS) ×1 IMPLANT
GEL ULTRASOUND 8.5O AQUASONIC (MISCELLANEOUS) ×1 IMPLANT
GLOVE BIO SURGEON STRL SZ 6 (GLOVE) ×5 IMPLANT
GLOVE BIOGEL PI IND STRL 6.5 (GLOVE) IMPLANT
GLOVE BIOGEL PI IND STRL 7.0 (GLOVE) IMPLANT
GLOVE BIOGEL PI INDICATOR 6.5 (GLOVE) ×1
GLOVE BIOGEL PI INDICATOR 7.0 (GLOVE) ×1
GOWN STRL REUS W/ TWL LRG LVL3 (GOWN DISPOSABLE) ×2 IMPLANT
GOWN STRL REUS W/TWL LRG LVL3 (GOWN DISPOSABLE) ×4
KIT BASIN OR (CUSTOM PROCEDURE TRAY) ×2 IMPLANT
NEEDLE 22X1 1/2 (OR ONLY) (NEEDLE) ×2 IMPLANT
NS IRRIG 1000ML POUR BTL (IV SOLUTION) ×4 IMPLANT
PACK GENERAL/GYN (CUSTOM PROCEDURE TRAY) ×2 IMPLANT
PAD ARMBOARD 7.5X6 YLW CONV (MISCELLANEOUS) ×6 IMPLANT
PEN SKIN MARKING BROAD (MISCELLANEOUS) ×2 IMPLANT
PENCIL BUTTON HOLSTER BLD 10FT (ELECTRODE) IMPLANT
SET COLLECT BLD 21X3/4 12 PB (MISCELLANEOUS) IMPLANT
SPONGE GAUZE 4X4 12PLY STER LF (GAUZE/BANDAGES/DRESSINGS) ×2 IMPLANT
SPONGE LAP 18X18 X RAY DECT (DISPOSABLE) IMPLANT
STAPLER VISISTAT 35W (STAPLE) ×3 IMPLANT
STOCKINETTE IMPERVIOUS 9X36 MD (GAUZE/BANDAGES/DRESSINGS) ×1 IMPLANT
STRIP CLOSURE SKIN 1/2X4 (GAUZE/BANDAGES/DRESSINGS) ×4 IMPLANT
SUT ETHILON 2 0 FS 18 (SUTURE) ×4 IMPLANT
SUT MNCRL AB 3-0 PS2 18 (SUTURE) ×3 IMPLANT
SUT MNCRL AB 4-0 PS2 18 (SUTURE) ×2 IMPLANT
SUT PDS AB 2-0 CT1 27 (SUTURE) ×3 IMPLANT
SUT PDS AB 2-0 CT2 27 (SUTURE) ×4 IMPLANT
SUT VIC AB 3-0 PS2 18 (SUTURE)
SUT VIC AB 3-0 PS2 18XBRD (SUTURE) ×4 IMPLANT
SUT VIC AB 3-0 SH 27 (SUTURE) ×2
SUT VIC AB 3-0 SH 27X BRD (SUTURE) IMPLANT
SUT VIC AB 3-0 SH 8-18 (SUTURE) ×1 IMPLANT
SUT VICRYL 4-0 PS2 18IN ABS (SUTURE) IMPLANT
SUT VLOC 180 0 24IN GS25 (SUTURE) IMPLANT
SYR 50ML SLIP (SYRINGE) IMPLANT
SYR BULB IRRIGATION 50ML (SYRINGE) ×2 IMPLANT
SYR CONTROL 10ML LL (SYRINGE) ×2 IMPLANT
TOWEL OR 17X24 6PK STRL BLUE (TOWEL DISPOSABLE) ×1 IMPLANT
TOWEL OR 17X26 10 PK STRL BLUE (TOWEL DISPOSABLE) ×2 IMPLANT
TRAY FOLEY W/METER SILVER 14FR (SET/KITS/TRAYS/PACK) ×1 IMPLANT
TUBE CONNECTING 12X1/4 (SUCTIONS) ×2 IMPLANT
WND VAC CANISTER 500ML (MISCELLANEOUS) ×1 IMPLANT

## 2016-09-18 NOTE — H&P (View-Only) (Signed)
   Plastic Surgery  Angiogram discussed with Dr. Randie Heinzain. Will proceed with attempt at gracilis flap though given her severe PVD, if pedicle flap does not have flow intraoperatively may need to abort procedure. Surgery scheduled for 9.7.18, patient NPO past MN.  Will see patient this pm to review surgery.  Glenna FellowsBrinda Aly Hauser, MD [facility] Plastic & Reconstructive Surgery [phone]  ADDENDUM 9.6.18 [phone]  Spoke with patient and reviewed surgery incisions, use VAC, drains. Reviewed if intraop blood flow concerns noted need to abort. Desires to proceed.

## 2016-09-18 NOTE — Op Note (Signed)
Operative Note   DATE OF OPERATION: 9.7.18  LOCATION: [facility] OR-inpatient  SURGICAL DIVISION: Plastic Surgery  PREOPERATIVE DIAGNOSES:  1. Open wound groin with complication 2. S/p removal infected left femoral popliteal bypass graft  POSTOPERATIVE DIAGNOSES:  same  PROCEDURE:  Left gracilis muscle flap to left groin  SURGEON: Glenna FellowsBrinda Tanetta Fuhriman MD MBA  ASSISTANT: none  ANESTHESIA:  General.   EBL: 100 ml  COMPLICATIONS: None immediate.   INDICATIONS FOR PROCEDURE:  The patient, Andrea [name], is a 66 y.o. female born on 1950/04/05, is here for muscle flap coverage of left femoral vessels following removal left femoral popliteal bypass, culture MRSA. BPA performed.   FINDINGS: Following removal  DESCRIPTION OF PROCEDURE:  Gracilis muscle viable post transfer with palpable pulse in pedicle.  DESCRIPTION OF PROCEDURE:  The patient's operative site was marked with the patient in the preoperative area. The patient was taken to the operating room. SCDs were placed. Patient is in on IV Vancomycin for treatment of MRSA and received scheduled dose. The patient's operative site was prepped and draped in a sterile fashion. A time out was performed and all information was confirmed to be correct. The staples removed and entered prior medial thigh incision. Hematoma noted in space between adductor longus and sartorius. This was evacuated and irrigated. Antibiotic beads left in place. Dissection completed medial to incision to expose gracilis. Fascial pace between gracilis and adductor longus muscles incised. Adductor longus reflected medially and vascular pedicle identified. Patency of this verified by doppler exam. Minor pedicles distally divided. Distally, the gracilis tendon was divided. Additional fascial attatchements divided so that tension free rotation into groin defect could be achieved.  Attention then directed to groin where all wound margins and granulation tissue excised to fresh  tissue. The cavity was irrigated with bacitracin-polymyxin solution and hemostasis obtained. Subcutaneous tunnel from donor site created and muscle rotated into groin. Muscle inset over exposed vascular graft with interrupted 2-0 PDS to superficial fascia and inguinal ligament.   15 Fr JP placed in subcutaneous position in both subcutaneouns groin and thigh donor site and secured with 2-0 nylon. Wound closure completed with 2-0 PDS in superficial fascia and 3-0 vicryl in dermis. Staples used for final skin closure. Incisional VAC applied and set to 125 mm Hg continuous.   The patient was allowed to wake from anesthesia, extubated and taken to the recovery room in satisfactory condition.   SPECIMENS: none  DRAINS: 15 Fr JP subcutaneous left groing, 15 Fr JP left medial thigh  Glenna FellowsBrinda Javyon Fontan, MD [facility] Plastic & Reconstructive Surgery [phone], pin [phone]

## 2016-09-18 NOTE — Interval H&P Note (Signed)
History and Physical Interval Note:  09/18/2016 9:50 AM  Andrea [name]  has presented today for surgery, with the diagnosis of OPEN WOUND LEFT GROIN WITH COMPLILCATIONS  The various methods of treatment have been discussed with the patient and family. After consideration of risks, benefits and other options for treatment, the patient has consented to  Procedure(s): LEFT GRACILIS MUSCLE FLAP TO LEFT GROIN (Left) as a surgical intervention .  The patient's history has been reviewed, patient examined, no change in status, stable for surgery.  I have reviewed the patient's chart and labs.  Questions were answered to the patient's satisfaction.     Darrel Gloss

## 2016-09-18 NOTE — Progress Notes (Signed)
Returned from PACU. Alert/oriented. Patient has a wound vac to right thigh and groin. 2 jp drains labeled 1,2. Patient has voided twice since surgery, currently on bedpan.

## 2016-09-18 NOTE — Progress Notes (Signed)
  Progress Note    09/18/2016 8:39 AM 2 Days Post-Op  Subjective:  No new complaints  Vitals:   09/18/16 0400 09/18/16 0420  BP:  139/65  Pulse:  (!) 53  Resp: 16 13  Temp:  98.2 F (36.8 C)  SpO2: 94% 99%    Physical Exam: aaox3 Abdomen is soft Left groin wound cdi Leg wounds cdi with staples R dp signal minimal left peroneal signal Stable gangrene of left great toe  CBC    Component Value Date/Time   WBC 13.1 (H) 09/15/2016 0848   RBC 3.71 (L) 09/15/2016 0848   HGB 10.6 (L) 09/15/2016 0848   HGB 10.8 (L) 07/01/2016 1445   HCT 32.9 (L) 09/15/2016 0848   HCT 32.9 (L) 07/01/2016 1445   PLT 333 09/15/2016 0848   PLT 340 07/01/2016 1445   MCV 88.7 09/15/2016 0848   MCV 88 07/01/2016 1445   MCH 28.6 09/15/2016 0848   MCHC 32.2 09/15/2016 0848   RDW 14.4 09/15/2016 0848   RDW 15.5 (H) 07/01/2016 1445   LYMPHSABS 3.0 07/01/2016 1445   MONOABS 0.3 05/25/2016 0349   EOSABS 0.4 07/01/2016 1445   BASOSABS 0.0 07/01/2016 1445    BMET    Component Value Date/Time   NA 143 09/18/2016 0338   NA 140 04/14/2016 1431   K 3.1 (L) 09/18/2016 0338   CL 100 (L) 09/18/2016 0338   CO2 31 09/18/2016 0338   GLUCOSE 90 09/18/2016 0338   BUN 14 09/18/2016 0338   BUN 19 04/14/2016 1431   CREATININE 0.80 09/18/2016 0338   CALCIUM 9.3 09/18/2016 0338   GFRNONAA >60 09/18/2016 0338   GFRAA >60 09/18/2016 0338    INR    Component Value Date/Time   INR 1.09 09/10/2016 1450     Intake/Output Summary (Last 24 hours) at 09/18/16 0839 Last data filed at 09/18/16 [id number]  Gross per 24 hour  Intake              370 ml  Output             1000 ml  Net             -630 ml      Assessment:   65 y.o.femaleis s/p: Washout and partial closure of left groin wound, closure of left thigh and below knee wounds (09/12/2016), left fem-pop graft removal and patch angioplasty of left common femoral and below knee popliteal artery (09/10/16) Aortogram on 9.5.   Plan:  flap coverage  with plastic surgery today if blood supply adequate Will need bypass to salvage left leg and left great toe amputation   Knut Rondinelli C. Randie Heinzain, MD [facility] [place] [facility] Office: [phone] Pager: [phone]  09/18/2016 8:39 AM

## 2016-09-18 NOTE — Anesthesia Preprocedure Evaluation (Signed)
Anesthesia Evaluation  Patient identified by MRN, date of birth, ID band Patient awake    Reviewed: Allergy & Precautions, NPO status , Patient's Chart, lab work & pertinent test results, reviewed documented beta blocker date and time   History of Anesthesia Complications (+) PONV and history of anesthetic complications  Airway Mallampati: I  TM Distance: >3 FB Neck ROM: Full    Dental  (+) Upper Dentures, Partial Lower, Dental Advisory Given, Poor Dentition   Pulmonary shortness of breath and with exertion, asthma , former smoker,    Pulmonary exam normal breath sounds clear to auscultation       Cardiovascular hypertension, Pt. on medications and Pt. on home beta blockers (-) angina+ CAD, + Cardiac Stents, + CABG and + Peripheral Vascular Disease  Normal cardiovascular exam Rhythm:Regular Rate:Normal     Neuro/Psych PSYCHIATRIC DISORDERS Diabetic neuropathy  Neuromuscular disease negative neurological ROS     GI/Hepatic Neg liver ROS, GERD  Medicated and Controlled,  Endo/Other  diabetes, Well Controlled, Type 2, Insulin Dependent  Renal/GU negative Renal ROS Bladder dysfunction  Overactive bladder    Musculoskeletal  (+) Fibromyalgia -Open wound left groin- complicated   Abdominal   Peds  Hematology  (+) anemia ,   Anesthesia Other Findings Fibromyalgia  Reproductive/Obstetrics                             Anesthesia Physical  Anesthesia Plan  ASA: III  Anesthesia Plan: General   Post-op Pain Management:    Induction: Intravenous  PONV Risk Score and Plan: 4 or greater and Ondansetron, Dexamethasone, Midazolam, Propofol infusion and Treatment may vary due to age or medical condition  Airway Management Planned: Oral ETT  Additional Equipment: None  Intra-op Plan:   Post-operative Plan: Extubation in OR  Informed Consent: I have reviewed the patients History and  Physical, chart, labs and discussed the procedure including the risks, benefits and alternatives for the proposed anesthesia with the patient or authorized representative who has indicated his/her understanding and acceptance.   Dental advisory given  Plan Discussed with: CRNA, Anesthesiologist and Surgeon  Anesthesia Plan Comments:         Anesthesia Quick Evaluation

## 2016-09-18 NOTE — Anesthesia Procedure Notes (Signed)
Procedure Name: Intubation Date/Time: 09/18/2016 12:41 PM Performed by: Shirlyn Goltz Pre-anesthesia Checklist: Patient identified, Emergency Drugs available, Suction available and Patient being monitored Patient Re-evaluated:Patient Re-evaluated prior to induction Oxygen Delivery Method: Circle system utilized Preoxygenation: Pre-oxygenation with 100% oxygen Induction Type: IV induction Ventilation: Mask ventilation without difficulty Laryngoscope Size: Mac and 3 Grade View: Grade I Tube type: Oral Tube size: 7.0 mm Number of attempts: 1 Airway Equipment and Method: Stylet Placement Confirmation: ETT inserted through vocal cords under direct vision,  positive ETCO2 and breath sounds checked- equal and bilateral Secured at: 21 cm Tube secured with: Tape Dental Injury: Teeth and Oropharynx as per pre-operative assessment

## 2016-09-18 NOTE — Progress Notes (Signed)
PT Cancellation Note  Patient Details Name: Andrea [name] MRN: [id number] DOB: 04/29/1950   Cancelled Treatment:    Reason Eval/Treat Not Completed: Patient at procedure or test/unavailable Pt has been taken to surgery. PT will continue to follow as able. Thanks.  Sydnee Lamour B. Beverely RisenVan [name] PT, DPT Acute Rehabilitation  [phone] Pager [phone]     Andrea [name] 09/18/2016, 12:39 PM

## 2016-09-18 NOTE — Transfer of Care (Signed)
Immediate Anesthesia Transfer of Care Note  Patient: Andrea [name]  Procedure(s) Performed: Procedure(s): LEFT GRACILIS MUSCLE FLAP TO LEFT GROIN (Left)  Patient Location: PACU  Anesthesia Type:General  Level of Consciousness: awake, alert , oriented and patient cooperative  Airway & Oxygen Therapy: Patient Spontanous Breathing and Patient connected to nasal cannula oxygen  Post-op Assessment: Report given to RN and Post -op Vital signs reviewed and stable  Post vital signs: Reviewed and stable  Last Vitals:  Vitals:   09/18/16 0916 09/18/16 1100  BP: (!) 194/63 (!) 151/58  Pulse: 79   Resp: 16   Temp: 36.6 C 36.7 C  SpO2: 98%     Last Pain:  Vitals:   09/18/16 1100  TempSrc: Oral  PainSc: 0-No pain      Patients Stated Pain Goal: 0 (09/15/16 0339)  Complications: No apparent anesthesia complications

## 2016-09-19 LAB — GLUCOSE, CAPILLARY
GLUCOSE-CAPILLARY: 66 mg/dL (ref 65–99)
GLUCOSE-CAPILLARY: 90 mg/dL (ref 65–99)
Glucose-Capillary: 62 mg/dL — ABNORMAL LOW (ref 65–99)
Glucose-Capillary: 98 mg/dL (ref 65–99)

## 2016-09-19 MED ORDER — ENOXAPARIN SODIUM 40 MG/0.4ML ~~LOC~~ SOLN
40.0000 mg | SUBCUTANEOUS | Status: DC
  Administered 2016-09-19 – 2016-09-23 (×5): 40 mg via SUBCUTANEOUS
  Filled 2016-09-19 (×5): qty 0.4

## 2016-09-19 NOTE — Progress Notes (Addendum)
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   DESCRIPTION OF PROCEDURE: Dr. Leta Baptisthimmappa Gracilis muscle viable post transfer with palpable pulse in pedicle. Staples used for final skin closure. Incisional VAC applied and set to 125 mm Hg continuous.  Drain total 135 cc since surgery Will need bypass to salvage left leg and left great toe amputation in the future.  Clinton GallantCOLLINS, EMMA [facility] 09/19/2016 8:27 AM -- Left first toe dry gangrene Left groin thigh no hematoma Emphasized PO intake to improve nutrition status Plan for most likely left leg obturator bypass but will allow some recovery first  Fabienne Brunsharles Schylar Allard, MD [facility] [place] Office: [phone] Pager: [phone]   Laboratory Lab Results: No results for input(s): WBC, HGB, HCT, PLT in the last 72 hours. BMET  Recent Labs  09/18/16 0338  NA 143  K 3.1*  CL 100*  CO2 31  GLUCOSE 90  BUN 14  CREATININE 0.80  CALCIUM 9.3    COAG Lab Results  Component Value Date   INR 1.09 09/10/2016   INR 0.93 07/16/2016   INR 1.10 06/12/2016   No results found for: PTT

## 2016-09-19 NOTE — Progress Notes (Addendum)
Pt has had two episodes of hypoglycemia, once this morning and again this afternoon. All SSI has been held today, patient has had very little PO intake.  Will continue to monitor. Paged Dr Darrick PennaFields, hold evening dose of lantus.

## 2016-09-19 NOTE — Progress Notes (Signed)
Pt noted to have a low blood glucose of 4. 240cc OJ given, will re-check BS around 7am.

## 2016-09-19 NOTE — Progress Notes (Signed)
  Plastic Surgery  POD# 1 gracilis flap to left groin  Temp:  [98 F (36.7 C)-98.8 F (37.1 C)] 98.8 F (37.1 C) (09/08 0400) Pulse Rate:  [65-89] 71 (09/08 0400) Resp:  [10-113] 13 (09/08 0800) BP: (91-175)/(42-101) 96/58 (09/08 0400) SpO2:  [91 %-98 %] 96 % (09/08 0800) Weight:  [60.1 kg (132 lb 8 oz)] 60.1 kg (132 lb 8 oz) (09/07 1212)   JP groin 37  Thigh 98  Patient crying, states they took away her pain medication  PE:  Incisional VAC in place over both thigh and groin incisions, dry intact JPs serosanguinous Patient alert, oriented Thigh soft  A/P May d/c bed rest- ok to resume PT/OT. Patient presently states needs more pain meds to "knock her out." Her PCA button was on floor, gave this back to her. States not enough.  Also PCA infusion restarted by RN.  Plan remove incisional VAC on POD#3 or 4. If plan to transfer to SNF, would be clear to transfer from Plastic Surgery standpoint at that time. However pain control issue presently, would need to be weaned off PCA prior to transfer.  Start DVT px, on ASA only presently.  Glenna FellowsBrinda Chaney Ingram, MD [facility] Plastic & Reconstructive Surgery [phone], pin [phone]

## 2016-09-20 LAB — BASIC METABOLIC PANEL
ANION GAP: 11 (ref 5–15)
BUN: 16 mg/dL (ref 6–20)
CHLORIDE: 95 mmol/L — AB (ref 101–111)
CO2: 32 mmol/L (ref 22–32)
Calcium: 8.5 mg/dL — ABNORMAL LOW (ref 8.9–10.3)
Creatinine, Ser: 1.09 mg/dL — ABNORMAL HIGH (ref 0.44–1.00)
GFR calc Af Amer: >60 mL/min (ref >60)
GFR calc non Af Amer: 53 mL/min — ABNORMAL LOW (ref >60)
GLUCOSE: 109 mg/dL — AB (ref 65–99)
Potassium: 2.4 mmol/L — CL (ref 3.5–5.1)
Sodium: 138 mmol/L (ref 135–145)

## 2016-09-20 LAB — GLUCOSE, CAPILLARY
GLUCOSE-CAPILLARY: 107 mg/dL — AB (ref 65–99)
GLUCOSE-CAPILLARY: 148 mg/dL — AB (ref 65–99)
Glucose-Capillary: 162 mg/dL — ABNORMAL HIGH (ref 65–99)
Glucose-Capillary: 121 mg/dL — ABNORMAL HIGH (ref 65–99)
Glucose-Capillary: 154 mg/dL — ABNORMAL HIGH (ref 65–99)
Glucose-Capillary: 225 mg/dL — ABNORMAL HIGH (ref 65–99)

## 2016-09-20 MED ORDER — POTASSIUM CHLORIDE CRYS ER 20 MEQ PO TBCR
80.0000 meq | EXTENDED_RELEASE_TABLET | Freq: Once | ORAL | Status: AC
  Administered 2016-09-20: 80 meq via ORAL
  Filled 2016-09-20: qty 4

## 2016-09-20 MED ORDER — POTASSIUM CHLORIDE CRYS ER 20 MEQ PO TBCR
20.0000 meq | EXTENDED_RELEASE_TABLET | Freq: Two times a day (BID) | ORAL | Status: DC | PRN
Start: 2016-09-20 — End: 2016-09-23
  Administered 2016-09-22 – 2016-09-23 (×2): 20 meq via ORAL
  Filled 2016-09-20 (×2): qty 1

## 2016-09-20 NOTE — Progress Notes (Signed)
CRITICAL VALUE ALERT  Critical Value: 2.4  Date & Time Notied: 09/20/2016  Provider Notified: Fabienne Brunsharles Fields MD,  Orders Received/Actions taken: Awaiting orders, will continue to monitor.

## 2016-09-20 NOTE — Progress Notes (Signed)
  Plastic Surgery  POD# 2 gracilis flap to left groin  Temp:  [98.1 F (36.7 C)-99.1 F (37.3 C)] 98.6 F (37 C) (09/09 0846) Pulse Rate:  [54-73] 57 (09/09 0846) Resp:  [15-24] 16 (09/09 0846) BP: (117-162)/(49-107) 128/57 (09/09 0846) SpO2:  [92 %-100 %] 94 % (09/09 0846) FiO2 (%):  [21 %] 21 % (09/09 0820)   JP groin 20 Thigh 20  In good spirits today, watching movie Not OOB yesterday  PE:  Incisional VAC in place over both thigh and groin incisions, dry intact JPs serosanguinous Patient alert, oriented Thigh soft  A/P Ok to resume PT/OT- WB left foot per Vascular, may resume as ordered prior to gracilis flap surgery.  Plan remove incisional VAC on POD#3 in pm. If plan to transfer to SNF, would be clear to transfer from Plastic Surgery standpoint at that time.  Glenna FellowsBrinda Dezman Granda, MD [facility] Plastic & Reconstructive Surgery [phone], pin [phone]

## 2016-09-20 NOTE — Progress Notes (Addendum)
[facility] [place]  Subjective  - Doing well comfortable.    Objective (!) 119/56 60 98.5 F (36.9 C) (Oral) 17 96%  Intake/Output Summary (Last 24 hours) at 09/20/16 0842 Last data filed at 09/20/16 0700  Gross per 24 hour  Intake          2000.83 ml  Output             2440 ml  Net          -439.17 ml    Doppler DP weak monophasic No Change with left foot great toe dry gangrene Wound vac in place no drainage JP drain 40 cc total watery   Assessment/Planning: 66 y.o.femaleis s/p: Washout and partial closure of left groin wound, closure of left thigh and below knee wounds (09/12/2016), left fem-pop graft removal and patch angioplasty of left common femoral and below knee popliteal artery (09/10/16) Aortogram on 9.5.  Plan remove incisional VAC on POD#3 or 4.  K+ 2.4 gave 80 PO once, then PRN 20 BID if K+ < 3.5.    Andrea GallantCOLLINS, Andrea [facility] 09/20/2016 8:42 AM --   Agree with above.  Flap and drain management per Plastics. Replete hypokalemia  Andrea Brunsharles Fields, MD [facility] [place] Office: [phone] Pager: [phone]  Laboratory Lab Results: No results for input(s): WBC, HGB, HCT, PLT in the last 72 hours. BMET  Recent Labs  09/18/16 0338 09/20/16 0328  NA 143 138  K 3.1* 2.4*  CL 100* 95*  CO2 31 32  GLUCOSE 90 109*  BUN 14 16  CREATININE 0.80 1.09*  CALCIUM 9.3 8.5*    COAG Lab Results  Component Value Date   INR 1.09 09/10/2016   INR 0.93 07/16/2016   INR 1.10 06/12/2016   No results found for: PTT

## 2016-09-21 ENCOUNTER — Encounter (HOSPITAL_COMMUNITY): Payer: BLUE CROSS/BLUE SHIELD

## 2016-09-21 ENCOUNTER — Encounter (HOSPITAL_COMMUNITY): Payer: Self-pay | Admitting: Plastic Surgery

## 2016-09-21 ENCOUNTER — Other Ambulatory Visit (HOSPITAL_COMMUNITY): Payer: BLUE CROSS/BLUE SHIELD

## 2016-09-21 ENCOUNTER — Telehealth: Payer: Self-pay

## 2016-09-21 LAB — GLUCOSE, CAPILLARY
GLUCOSE-CAPILLARY: 122 mg/dL — AB (ref 65–99)
Glucose-Capillary: 120 mg/dL — ABNORMAL HIGH (ref 65–99)
Glucose-Capillary: 219 mg/dL — ABNORMAL HIGH (ref 65–99)

## 2016-09-21 LAB — BASIC METABOLIC PANEL
Anion gap: 13 (ref 5–15)
BUN: 16 mg/dL (ref 6–20)
CALCIUM: 8.9 mg/dL (ref 8.9–10.3)
CO2: 23 mmol/L (ref 22–32)
CREATININE: 0.95 mg/dL (ref 0.44–1.00)
Chloride: 100 mmol/L — ABNORMAL LOW (ref 101–111)
GFR calc Af Amer: >60 mL/min (ref >60)
GLUCOSE: 109 mg/dL — AB (ref 65–99)
Potassium: 3.7 mmol/L (ref 3.5–5.1)
Sodium: 136 mmol/L (ref 135–145)

## 2016-09-21 NOTE — Progress Notes (Signed)
  Plastic Surgery  POD# 3 gracilis flap to left groin  Temp:  [98.3 F (36.8 C)-99 F (37.2 C)] 98.3 F (36.8 C) (09/10 0823) Pulse Rate:  [59-66] 59 (09/10 0823) Resp:  [12-20] 18 (09/10 0823) BP: (125-177)/(48-66) 149/66 (09/10 0823) SpO2:  [96 %-100 %] 99 % (09/10 0823) Weight:  [58.7 kg (129 lb 4.8 oz)] 58.7 kg (129 lb 4.8 oz) (09/10 0400)   JP groin 20 Thigh 5 - removed this am  OOB with PT/OT Confused this am, but cooperative  PE:  Incisional VAC removed, incisions intact dry JP serous Thigh soft  A/P D/c VAC. Dry dressing to groin daily and PRN. Thigh JP d/c today. Possible d/c groin JP tomorrow.  Glenna FellowsBrinda Jolayne Branson, MD [facility] Plastic & Reconstructive Surgery [phone], pin [phone]

## 2016-09-21 NOTE — Progress Notes (Signed)
Occupational Therapy Treatment Patient Details Name: Andrea [name] MRN: [id number] DOB: Dec 29, 1950 Today's Date: 09/21/2016    History of present illness 66 year old female s/p redo Lt fempop BPG with I&D 8/30, partial closure and washout 9/1. PMHx: Lt fem-below knee popliteal artery BPG and Lt fem endarterectomy  07/20/16, CABG, fibromyalgia, urinary incontinence, right fem-below knee popliteal artery BPG with prior infection and debridement. Pt now s/p L gracilis muscle flap to L groin 09/18/16.   OT comments  Pt demonstrating confusion throughout session reporting that she went home yesterday to visit with a friend and saw a cat in her room. However, pt was able to [place]. She was able to complete stand-pivot toilet transfer with mod assist this session to chair with complaints of L great toe pain during mobility. D/C recommendation remains appropriate. OT will continue to follow.    Follow Up Recommendations  SNF;Supervision/Assistance - 24 hour    Equipment Recommendations       Recommendations for Other Services      Precautions / Restrictions Precautions Precautions: Fall Precaution Comments: pt reports several recent falls Restrictions Weight Bearing Restrictions: No       Mobility Bed Mobility Overal bed mobility: Needs Assistance Bed Mobility: Supine to Sit     Supine to sit: Min assist     General bed mobility comments: Min assist to scoot hips.   Transfers Overall transfer level: Needs assistance Equipment used: 1 person hand held assist Transfers: Stand Pivot Transfers Sit to Stand: Mod assist Stand pivot transfers: Mod assist       General transfer comment: Assist to power up as well as for balance once in standing position. Cues for technique and sequence. Utilized face to face technique this session.     Balance Overall balance assessment: Needs assistance Sitting-balance support: No upper extremity  supported;Feet supported Sitting balance-Leahy Scale: Fair     Standing balance support: Bilateral upper extremity supported Standing balance-Leahy Scale: Poor Standing balance comment: B UE support and external assistance required.                            ADL either performed or assessed with clinical judgement   ADL Overall ADL's : Needs assistance/impaired Eating/Feeding: Supervision/ safety;Sitting                   Lower Body Dressing: Maximal assistance;Sit to/from stand   Toilet Transfer: Moderate assistance;Stand-pivot Toilet Transfer Details (indicate cue type and reason): Simulated bed<>chair Toileting- Clothing Manipulation and Hygiene: Maximal assistance;Sit to/from stand         General ADL Comments: Pt confused throughout session. Reported yesterday she went home and saw her cat.      Vision Patient Visual Report: No change from baseline     Perception     Praxis      Cognition Arousal/Alertness: Awake/alert Behavior During Therapy: WFL for tasks assessed/performed Overall Cognitive Status: Impaired/Different from baseline Area of Impairment: Safety/judgement;Problem solving;Orientation;Memory                 Orientation Level: Disoriented to;Situation Current Attention Level: Sustained Memory: Decreased short-term memory Following Commands: Follows one step commands consistently;Follows one step commands with increased time Safety/Judgement: Decreased awareness of safety   Problem Solving: Slow processing;Decreased initiation;Difficulty sequencing;Requires tactile cues;Requires verbal cues General Comments: Pt able to report [place] the hospital. Frequently asking about friends/family members who are  not present. However, she reports that she went home yesterday and saw a cat in her house.         Exercises     Shoulder Instructions       General Comments      Pertinent Vitals/ Pain       Pain  Assessment: Faces Faces Pain Scale: Hurts little more Pain Location: L great toe during stand-pivot toilet transfer Pain Descriptors / Indicators: Sore Pain Intervention(s): Monitored during session;Repositioned  Home Living                                          Prior Functioning/Environment              Frequency  Min 2X/week        Progress Toward Goals  OT Goals(current goals can now be found in the care plan section)  Progress towards OT goals: Progressing toward goals  Acute Rehab OT Goals Patient Stated Goal: be able to return home eventually OT Goal Formulation: With patient Time For Goal Achievement: 09/25/16 Potential to Achieve Goals: Good  Plan Discharge plan remains appropriate    Co-evaluation                 AM-PAC PT "6 Clicks" Daily Activity     Outcome Measure   Help from another person eating meals?: None Help from another person taking care of personal grooming?: A Little Help from another person toileting, which includes using toliet, bedpan, or urinal?: A Lot Help from another person bathing (including washing, rinsing, drying)?: A Lot Help from another person to put on and taking off regular upper body clothing?: A Little Help from another person to put on and taking off regular lower body clothing?: A Lot 6 Click Score: 16    End of Session Equipment Utilized During Treatment: Oxygen  OT Visit Diagnosis: Unsteadiness on feet (R26.81);History of falling (Z91.81);Pain Pain - Right/Left: Left Pain - part of body: Leg   Activity Tolerance Patient tolerated treatment well   Patient Left in bed;with call bell/phone within reach;with bed alarm set   Nurse Communication Mobility status        Time: 4098-1191 OT Time Calculation (min): 55 min  Charges: OT General Charges $OT Visit: 1 Visit OT Treatments $Self Care/Home Management : 53-67 mins  Doristine Section, MS OTR/L  Pager: [phone]    Andrea [name] 09/21/2016, 9:12 AM

## 2016-09-21 NOTE — Anesthesia Postprocedure Evaluation (Signed)
Anesthesia Post Note  Patient: Andrea [name]  Procedure(s) Performed: Procedure(s) (LRB): LEFT GRACILIS MUSCLE FLAP TO LEFT GROIN (Left)     Patient location during evaluation: PACU Anesthesia Type: General Level of consciousness: awake Pain management: pain level controlled Vital Signs Assessment: post-procedure vital signs reviewed and stable Respiratory status: spontaneous breathing Cardiovascular status: stable Postop Assessment: no signs of nausea or vomiting Anesthetic complications: no    Last Vitals:  Vitals:   09/21/16 0400 09/21/16 0618  BP: (!) 177/58   Pulse: 64   Resp: 19 19  Temp: 36.9 C   SpO2: 99% 100%    Last Pain:  Vitals:   09/21/16 0618  TempSrc:   PainSc: 0-No pain   Pain Goal: Patients Stated Pain Goal: 8 (09/18/16 2000)               Conleigh Heinlein JR,JOHN Susann GivensFRANKLIN

## 2016-09-21 NOTE — Progress Notes (Signed)
Wound care performed to left great toe as ordered.  Will continue to monitor.

## 2016-09-21 NOTE — Progress Notes (Signed)
JP #2 removed as per MD order without difficulty.  Will continue to monitor.

## 2016-09-21 NOTE — Progress Notes (Signed)
Physical Therapy Treatment Patient Details Name: Andrea [name] MRN: [id number] DOB: January 15, 1950 Today's Date: 09/21/2016    History of Present Illness 66 year old female s/p redo Lt fempop BPG with I&D 8/30, partial closure and washout 9/1. PMHx: Lt fem-below knee popliteal artery BPG and Lt fem endarterectomy  07/20/16, CABG, fibromyalgia, urinary incontinence, right fem-below knee popliteal artery BPG with prior infection and debridement.  Pt now s/p L gracilis muscle flap to L groin 09/18/16.    PT Comments    Patient tolerated sit to stands X3 from recliner and BSC and short distance gait in room. Pt required mod/max A for sit to stand transfers and ambulation due to balance deficits. Pt was confused throughout session. Continue to progress as tolerated with anticipated d/c to SNF for further skilled PT services.     Follow Up Recommendations  SNF;Supervision for mobility/OOB     Equipment Recommendations  None recommended by PT    Recommendations for Other Services OT consult     Precautions / Restrictions Precautions Precautions: Fall Precaution Comments: pt reports several recent falls Restrictions Weight Bearing Restrictions: No    Mobility  Bed Mobility Overal bed mobility: Needs Assistance Bed Mobility: Sit to Supine       Sit to supine: Min assist   General bed mobility comments: assist to bring bilat LE into bed and cues for sequencing and technique; pt up in chair upon arrival  Transfers Overall transfer level: Needs assistance Equipment used: Rolling walker (2 wheeled) Transfers: Sit to/from Stand Sit to Stand: Mod assist         General transfer comment: assist to power up into standing and for balance upon stand; cues for safety; pt with posterior bias  Ambulation/Gait Ambulation/Gait assistance: Mod assist;Max assist Ambulation Distance (Feet):  (11ft X2) Assistive device: Rolling walker (2 wheeled) Gait Pattern/deviations: Trunk flexed;Decreased  step length - right;Decreased step length - left;Step-through pattern;Narrow base of support     General Gait Details: assist for balance and management of RW; pt with posterior bias; multimodal cues for posture and vc for navigating room   Stairs            Wheelchair Mobility    Modified Rankin (Stroke Patients Only)       Balance Overall balance assessment: Needs assistance Sitting-balance support: No upper extremity supported;Feet supported Sitting balance-Leahy Scale: Fair     Standing balance support: Bilateral upper extremity supported Standing balance-Leahy Scale: Poor                              Cognition Arousal/Alertness: Awake/alert Behavior During Therapy: WFL for tasks assessed/performed Overall Cognitive Status: Impaired/Different from baseline Area of Impairment: Safety/judgement;Problem solving;Orientation;Memory;Attention;Following commands;Awareness                 Orientation Level: Disoriented to;Situation;Place;Time Current Attention Level: Sustained Memory: Decreased short-term memory;Decreased recall of precautions Following Commands: Follows one step commands consistently;Follows one step commands with increased time Safety/Judgement: Decreased awareness of safety;Decreased awareness of deficits Awareness: Intellectual Problem Solving: Slow processing;Decreased initiation;Difficulty sequencing;Requires tactile cues;Requires verbal cues General Comments: pt was confused during today's session      Exercises      General Comments        Pertinent Vitals/Pain Pain Assessment: Faces Faces Pain Scale: Hurts little more Pain Location: L LE Pain Descriptors / Indicators: Sore Pain Intervention(s): Limited activity within patient's tolerance;Monitored during session;Premedicated before session;Repositioned    Home Living  Prior Function            PT Goals (current goals can now be  found in the care plan section) Acute Rehab PT Goals PT Goal Formulation: With patient Time For Goal Achievement: 09/25/16 Potential to Achieve Goals: Fair Progress towards PT goals: Progressing toward goals    Frequency    Min 3X/week      PT Plan Current plan remains appropriate    Co-evaluation              AM-PAC PT "6 Clicks" Daily Activity  Outcome Measure  Difficulty turning over in bed (including adjusting bedclothes, sheets and blankets)?: A Lot Difficulty moving from lying on back to sitting on the side of the bed? : Unable Difficulty sitting down on and standing up from a chair with arms (e.g., wheelchair, bedside commode, etc,.)?: Unable Help needed moving to and from a bed to chair (including a wheelchair)?: A Lot Help needed walking in hospital room?: A Lot Help needed climbing 3-5 steps with a railing? : Total 6 Click Score: 9    End of Session Equipment Utilized During Treatment: Gait belt Activity Tolerance: Patient tolerated treatment well Patient left: in bed;with call bell/phone within reach;with bed alarm set;with nursing/sitter in room Nurse Communication: Mobility status PT Visit Diagnosis: Difficulty in walking, not elsewhere classified (R26.2);History of falling (Z91.81)     Time: [id number] PT Time Calculation (min) (ACUTE ONLY): 39 min  Charges:  $Gait Training: 8-22 mins $Therapeutic Activity: 23-37 mins                    G Codes:       Erline LevineKellyn Yoniel Arkwright, PTA Pager: [phone]     Carolynne EdouardKellyn R Markell Schrier 09/21/2016, 2:16 PM

## 2016-09-21 NOTE — Care Management Note (Signed)
Case Management Note Donn PieriniKristi Ronette Hank RN, BSN Unit 4E-Case Manager [phone]  Patient Details  Name: Andrea [name] MRN: [id number] Date of Birth: 12/15/1950  Subjective/Objective:   Pt admitted with infected graft s/p I&D left groin hematoma, excision of left femoropopliteal bypass graft-- will return to OR on 9/1                 Action/Plan: PTA Pt lived at home- per PT/OT evals recommendations for SNF- CSW consulted for possible placement- CM will follow   Expected Discharge Date:                  Expected Discharge Plan:  Skilled Nursing Facility  In-House Referral:  Clinical Social Work  Discharge planning Services  CM Consult  Post Acute Care Choice:    Choice offered to:     DME Arranged:    DME Agency:     HH Arranged:    HH Agency:     Status of Service:  In process, will continue to follow  If discussed at Long Length of Stay Meetings, dates discussed:  9/6  Discharge Disposition: skilled facility   Additional Comments:  09/21/16- 1000- Donn PieriniKristi Tesla Bochicchio RN, CM- pt s/p muscle flap per plastics on 09/18/16- incisional wound VAC in place- plan to remove post op day 3- CSW following for SNF placement  Zenda AlpersWebster, Lenn SinkKristi Hall, RN 09/21/2016, 10:09 AM

## 2016-09-21 NOTE — Progress Notes (Signed)
Pt. Mental status has been changing. Been hallucinating. MD notified

## 2016-09-21 NOTE — Telephone Encounter (Signed)
[facility].  Will reschedule pending discharge plan.

## 2016-09-21 NOTE — Progress Notes (Addendum)
[facility] [place]  Subjective  -  Doing OK.     Objective (!) 177/58 64 98.5 F (36.9 C) (Oral) 19 100%  Intake/Output Summary (Last 24 hours) at 09/21/16 0742 Last data filed at 09/21/16 0644  Gross per 24 hour  Intake              480 ml  Output             1170 ml  Net             -690 ml    Doppler DP B, left weak without change Active range of motion B intact Left great toe gangrene without change Wound vac in place no output JP watery discharge   Assessment/Planning: 66 y.o.femaleis s/p: Washout and partial closure of left groin wound, closure of left thigh and below knee wounds (09/12/2016), left fem-pop graft removal and patch angioplasty of left common femoral and below knee popliteal artery (09/10/16) Aortogram on 9.5.  09/18/2016 Dr. Leta Baptisthimmappa PROCEDURE:  Left gracilis muscle flap to left groin Wound vac change planned for today.  Plan for repeat bypass in the future   Clinton GallantCOLLINS, EMMA [facility] 09/21/2016 7:42 AM   Addendum  I have independently interviewed and examined the patient, and I agree with the physician assistant's findings.  Adherent VAC.  Limited JP output.    - L groin wound mgmt per Plastics. - Dr. Randie Heinzain will be back tomorrow  Leonides SakeBrian Chen, MD, FACS [facility] [place] Office: [phone] Pager: [phone]  09/21/2016, 7:52 AM   --  Laboratory Lab Results: No results for input(s): WBC, HGB, HCT, PLT in the last 72 hours. BMET  Recent Labs  09/20/16 0328  NA 138  K 2.4*  CL 95*  CO2 32  GLUCOSE 109*  BUN 16  CREATININE 1.09*  CALCIUM 8.5*    COAG Lab Results  Component Value Date   INR 1.09 09/10/2016   INR 0.93 07/16/2016   INR 1.10 06/12/2016   No results found for: PTT

## 2016-09-22 ENCOUNTER — Ambulatory Visit: Payer: BLUE CROSS/BLUE SHIELD | Admitting: Family Medicine

## 2016-09-22 ENCOUNTER — Inpatient Hospital Stay (HOSPITAL_COMMUNITY): Payer: BLUE CROSS/BLUE SHIELD

## 2016-09-22 LAB — CBC
HCT: 29.4 % — ABNORMAL LOW (ref 36.0–46.0)
HEMOGLOBIN: 9.5 g/dL — AB (ref 12.0–15.0)
MCH: 28.1 pg (ref 26.0–34.0)
MCHC: 32.3 g/dL (ref 30.0–36.0)
MCV: 87 fL (ref 78.0–100.0)
PLATELETS: 325 10*3/uL (ref 150–400)
RBC: 3.38 MIL/uL — AB (ref 3.87–5.11)
RDW: 14 % (ref 11.5–15.5)
WBC: 13.6 10*3/uL — AB (ref 4.0–10.5)

## 2016-09-22 LAB — GLUCOSE, CAPILLARY
GLUCOSE-CAPILLARY: 116 mg/dL — AB (ref 65–99)
GLUCOSE-CAPILLARY: 138 mg/dL — AB (ref 65–99)
Glucose-Capillary: 60 mg/dL — ABNORMAL LOW (ref 65–99)
Glucose-Capillary: 100 mg/dL — ABNORMAL HIGH (ref 65–99)

## 2016-09-22 LAB — BASIC METABOLIC PANEL
ANION GAP: 7 (ref 5–15)
BUN: 15 mg/dL (ref 6–20)
CHLORIDE: 98 mmol/L — AB (ref 101–111)
CO2: 31 mmol/L (ref 22–32)
Calcium: 8.8 mg/dL — ABNORMAL LOW (ref 8.9–10.3)
Creatinine, Ser: 1.06 mg/dL — ABNORMAL HIGH (ref 0.44–1.00)
GFR, EST NON AFRICAN AMERICAN: 55 mL/min — AB (ref >60)
Glucose, Bld: 110 mg/dL — ABNORMAL HIGH (ref 65–99)
POTASSIUM: 2.7 mmol/L — AB (ref 3.5–5.1)
SODIUM: 136 mmol/L (ref 135–145)

## 2016-09-22 LAB — MAGNESIUM: MAGNESIUM: 1.5 mg/dL — AB (ref 1.7–2.4)

## 2016-09-22 LAB — POTASSIUM: POTASSIUM: 3 mmol/L — AB (ref 3.5–5.1)

## 2016-09-22 LAB — VANCOMYCIN, TROUGH: Vancomycin Tr: 14 ug/mL — ABNORMAL LOW (ref 15–20)

## 2016-09-22 MED ORDER — POTASSIUM CHLORIDE 10 MEQ/100ML IV SOLN
10.0000 meq | INTRAVENOUS | Status: DC
  Administered 2016-09-22: 10 meq via INTRAVENOUS
  Filled 2016-09-22: qty 100

## 2016-09-22 MED ORDER — INSULIN GLARGINE 100 UNIT/ML ~~LOC~~ SOLN
10.0000 [IU] | Freq: Once | SUBCUTANEOUS | Status: AC
  Administered 2016-09-22: 10 [IU] via SUBCUTANEOUS
  Filled 2016-09-22: qty 0.1

## 2016-09-22 MED ORDER — POTASSIUM CHLORIDE CRYS ER 20 MEQ PO TBCR
20.0000 meq | EXTENDED_RELEASE_TABLET | Freq: Once | ORAL | Status: AC
  Administered 2016-09-22: 20 meq via ORAL
  Filled 2016-09-22: qty 1

## 2016-09-22 NOTE — Progress Notes (Signed)
Late entry: assumed care from off going RN ; patient alert & oriented to self, place; Patient NSR with no complaints; drain discontinued as order; patient up to chair about 1030 with chair alarm; PT worked with patient;    MD notified ; PCA discontinued;    patient in bed with call bell; family @ bedside

## 2016-09-22 NOTE — Progress Notes (Signed)
Pharmacy Antibiotic Note  Andrea [name] is a 66 y.o. 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  Pharmacy was consulted for Vancomycin and Zosyn dosing on 8/30. His Zosyn was discontinued due to culture data revealing MRSA.  ID has been following and suggest continuing Vanc while here then start Doxycycline to finish out her course at discharge.  WBC remains around 13. Scr has been stable around 1 (today Scr 1.06; CrCl ~43). Last vancomycin trough was at goal at 15. Vancomycin trough was 14 ~week later today, which is close to goal range of 15-20.   Plan: Continue Vancomycin 500 mg IV every 12 hours.  Goal trough 15-20 mcg/mL. Monitor clinical progress, renal function, discharge plan    Height: 5' 2.01" (157.5 cm) Weight: 125 lb 11.2 oz (57 kg) IBW/kg (Calculated) : 50.12  Temp (24hrs), Avg:98.4 F (36.9 C), Min:98 F (36.7 C), Max:98.7 F (37.1 C)   Recent Labs Lab 09/16/16 1805 09/18/16 0338 09/20/16 0328 09/21/16 0901 09/22/16 0248 09/22/16 1131  WBC  --   --   --   --  13.6*  --   CREATININE  --  0.80 1.09* 0.95 1.06*  --   VANCOTROUGH 15  --   --   --   --  14*    Estimated Creatinine Clearance: 43 mL/min (A) (by C-G formula based on SCr of 1.06 mg/dL (H)).    Allergies  Allergen Reactions  . Naproxen Itching  . Tramadol Hcl Itching   Antimicrobials this admission: 8/30 cefuroxime >> x1 intra-op 8/30 vanc >>  8/30 Zosyn >>9/1  Dose adjustments this admission:  Microbiology results: 8/30 wound >> MRSA  Girard CooterKimberly Perkins, PharmD Clinical Pharmacist  Phone: [phone] 09/22/2016 12:33 PM

## 2016-09-22 NOTE — Progress Notes (Signed)
  Plastic Surgery  POD# 4 gracilis flap to left groin  Temp:  [98 F (36.7 C)-98.7 F (37.1 C)] 98 F (36.7 C) (09/11 1224) Pulse Rate:  [52-69] 52 (09/11 1224) Resp:  [15-22] 17 (09/11 1224) BP: (115-128)/(44-89) 122/49 (09/11 1224) SpO2:  [83 %-99 %] 95 % (09/11 1224) FiO2 (%):  [37 %] 37 % (09/10 1641) Weight:  [57 kg (125 lb 11.2 oz)] 57 kg (125 lb 11.2 oz) (09/11 0400)   JP groin non recorded overnight, staff states scant and removed this am Sleeping, arouses but lethargic  PE:  Some epidermolysis groin and thigh incisions dry, intact Thigh soft  A/P Groin drain removed this am. Dry dressing to groin only.  I will be out of town from 9.12.18-9.16.18. Please contact Dr. Ulice Boldillingham for any concerns in interim. I have made f/u appt in my clinic if patient transfers/discharges.  There is full muscle coverage over femoral vessels- if any wound break down hopefull would be able to treat with VAC or local wound care.   Glenna FellowsBrinda Margret Moat, MD [facility] Plastic & Reconstructive Surgery [phone], pin [phone]

## 2016-09-22 NOTE — NC FL2 (Signed)
[place] MEDICAID FL2 LEVEL OF CARE SCREENING TOOL     IDENTIFICATION  Patient Name: Andrea [name] Birthdate: 06-Jul-1950 Sex: female Admission Date (Current Location): 09/10/2016  [facility] and [place] Number:  Producer, television/film/video and Address:  [facility], [address], [place], [place] [address]      Provider Number: [id number]  Attending Physician Name and Address:  Juventino Slovak*  Relative Name and Phone Number:  Josem Kaufmann , [phone]    Current Level of Care: Hospital Recommended Level of Care: Skilled Nursing Facility Prior Approval Number:    Date Approved/Denied:   PASRR Number: [id number]  Discharge Plan: SNF    Current Diagnoses: Patient Active Problem List   Diagnosis Date Noted  . 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 Atherosclerosis of native arteries of extremities with gangrene, left leg (HCC) 07/20/2016  . GERD (gastroesophageal reflux disease) 07/01/2016  . Adjustment reaction with anxiety and depression 07/01/2016  . Wound infection 06/12/2016  . CAD (coronary artery disease) of artery bypass graft 04/15/2016  . Coronary artery disease involving coronary bypass graft of native heart with angina pectoris (HCC)   . Coronary artery disease involving native coronary artery of native heart without angina pectoris 04/07/2016  . PAD (peripheral artery disease) (HCC) 03/30/2016  . Diabetic foot ulcers (HCC) 03/30/2016  . Hyperlipidemia 03/30/2016  . Diabetic neuropathy (HCC) 02/24/2016  . Tobacco abuse 02/24/2016  . Overactive bladder 01/14/2016  . Diabetes mellitus without complication (HCC) 10/18/2006  . Essential hypertension 10/18/2006  . LOW BACK PAIN, CHRONIC 10/18/2006    Orientation RESPIRATION BLADDER Height & Weight     Self  O2 (2L) Indwelling catheter Weight: 125 lb 11.2 oz (57 kg) Height:  5' 2.01" (157.5 cm)  BEHAVIORAL SYMPTOMS/MOOD NEUROLOGICAL BOWEL NUTRITION STATUS       Continent Diet (heart healthy/carb modified)  AMBULATORY STATUS COMMUNICATION OF NEEDS Skin   Limited Assist Verbally Surgical wounds (wound L groin)                       Personal Care Assistance Level of Assistance  Bathing, Feeding, Dressing Bathing Assistance: Limited assistance Feeding assistance: Independent Dressing Assistance: Limited assistance     Functional Limitations Info  Sight, Hearing, Speech Sight Info: Adequate Hearing Info: Adequate Speech Info: Adequate    SPECIAL CARE FACTORS FREQUENCY  PT (By licensed PT), OT (By licensed OT)     PT Frequency: 5x wk OT Frequency: 5x wk            Contractures      Additional Factors Info  Code Status, Allergies Code Status Info: Full Code Allergies Info: NAPROXEN, TRAMADOL HCL           Current Medications (09/22/2016):  This is the current hospital active medication list Current Facility-Administered Medications  Medication Dose Route Frequency Provider Last Rate Last Dose  . 0.45 % sodium chloride infusion   Intravenous Continuous Glenna Fellows, MD 50 mL/hr at 09/21/16 2132    . acetaminophen (TYLENOL) tablet 325-650 mg  325-650 mg Oral Q4H PRN Rhyne, Samantha J, PA-C       Or  . acetaminophen (TYLENOL) suppository 325-650 mg  325-650 mg Rectal Q4H PRN Rhyne, Samantha J, PA-C      . albuterol (PROVENTIL) (2.5 MG/3ML) 0.083% nebulizer solution 2.5 mg  2.5 mg Nebulization Q6H PRN Maeola Harman, MD      . aspirin EC tablet 81 mg  81 mg  Oral Daily Dara Lords, PA-C   81 mg at 09/22/16 1003  . atorvastatin (LIPITOR) tablet 80 mg  80 mg Oral QHS Dara Lords, PA-C   80 mg at 09/21/16 2123  . bisacodyl (DULCOLAX) suppository 10 mg  10 mg Rectal Daily PRN Rhyne, Samantha J, PA-C      . cyclobenzaprine (FLEXERIL) tablet 5 mg  5 mg Oral Q6H PRN Rhyne, Samantha J, PA-C   5 mg at 09/12/16 1805  . diphenhydrAMINE (BENADRYL) injection 12.5 mg  12.5 mg Intravenous Q6H PRN Maeola Harman, MD       Or  . diphenhydrAMINE (BENADRYL) 12.5 MG/5ML elixir 12.5 mg  12.5 mg Oral Q6H PRN Maeola Harman, MD      . docusate sodium (COLACE) capsule 100 mg  100 mg Oral Daily Rhyne, Samantha J, PA-C   100 mg at 09/22/16 1004  . DULoxetine (CYMBALTA) DR capsule 60 mg  60 mg Oral Daily Rhyne, Samantha J, PA-C   60 mg at 09/22/16 1005  . enoxaparin (LOVENOX) injection 40 mg  40 mg Subcutaneous Q24H Glenna Fellows, MD   40 mg at 09/21/16 1154  . furosemide (LASIX) tablet 20 mg  20 mg Oral Daily Rhyne, Samantha J, PA-C   20 mg at 09/22/16 1006  . gabapentin (NEURONTIN) capsule 300 mg  300 mg Oral TID Rhyne, Samantha J, PA-C   300 mg at 09/22/16 1004  . guaiFENesin-dextromethorphan (ROBITUSSIN DM) 100-10 MG/5ML syrup 15 mL  15 mL Oral Q4H PRN Rhyne, Samantha J, PA-C      . hydrALAZINE (APRESOLINE) injection 5 mg  5 mg Intravenous Q20 Min PRN Rhyne, [place]es Coupe, PA-C   5 mg at 09/18/16 1908  . hydrochlorothiazide (HYDRODIURIL) tablet 25 mg  25 mg Oral Daily Rhyne, Samantha J, PA-C   25 mg at 09/22/16 1004  . HYDROcodone-acetaminophen (NORCO/VICODIN) 5-325 MG per tablet 1-2 tablet  1-2 tablet Oral Q4H PRN Dara Lords, PA-C   2 tablet at 09/19/16 1209  . HYDROmorphone (DILAUDID) 1 mg/mL PCA injection   Intravenous Q4H Maeola Harman, MD   0.6 mg at 09/22/16 0509  . insulin aspart (novoLOG) injection 0-9 Units  0-9 Units Subcutaneous TID WC Rhyne, Samantha J, PA-C   1 Units at 09/21/16 1252  . insulin glargine (LANTUS) injection 30 Units  30 Units Subcutaneous QHS Dara Lords, PA-C   30 Units at 09/21/16 2129  . labetalol (NORMODYNE,TRANDATE) injection 10 mg  10 mg Intravenous Q10 min PRN Rhyne, Samantha J, PA-C      . metoprolol tartrate (LOPRESSOR) injection 2-5 mg  2-5 mg Intravenous Q2H PRN Rhyne, Samantha J, PA-C      . metoprolol tartrate (LOPRESSOR) tablet 12.5 mg  12.5 mg Oral BID Rhyne, Samantha J, PA-C   12.5 mg at 09/22/16 1002  .  mometasone-formoterol (DULERA) 100-5 MCG/ACT inhaler 2 puff  2 puff Inhalation BID Dara Lords, PA-C   2 puff at 09/22/16 0938  . naloxone [facility]) injection 0.4 mg  0.4 mg Intravenous PRN Maeola Harman, MD       And  . sodium chloride flush (NS) 0.9 % injection 9 mL  9 mL Intravenous PRN Maeola Harman, MD      . ondansetron [facility]) injection 4 mg  4 mg Intravenous Q6H PRN Rhyne, Samantha J, PA-C   4 mg at 09/13/16 1309  . ondansetron (ZOFRAN) injection 4 mg  4 mg Intravenous Q6H PRN Maeola Harman, MD      .  oxybutynin (DITROPAN) tablet 5 mg  5 mg Oral BID Rhyne, Samantha J, PA-C   5 mg at 09/22/16 1005  . pantoprazole (PROTONIX) EC tablet 20 mg  20 mg Oral Daily Rhyne, Samantha J, PA-C   20 mg at 09/22/16 1004  . phenol (CHLORASEPTIC) mouth spray 1 spray  1 spray Mouth/Throat PRN Rhyne, Samantha J, PA-C      . polyethylene glycol (MIRALAX / GLYCOLAX) packet 17 g  17 g Oral Daily PRN Dara LordsRhyne, Samantha J, PA-C   17 g at 09/20/16 2141  . potassium chloride SA (K-DUR,KLOR-CON) CR tablet 20 mEq  20 mEq Oral BID PRN Clinton Gallantollins, Emma M, PA-C      . pregabalin (LYRICA) capsule 100 mg  100 mg Oral BID Rhyne, Samantha J, PA-C   100 mg at 09/22/16 1005  . vancomycin (VANCOCIN) 500 mg in sodium chloride 0.9 % 100 mL IVPB  500 mg Intravenous Q12H Silvana NewnessMeyer, Andrew D, Anthony M Yelencsics CommunityRPH   Stopped at 09/22/16 0151     Discharge Medications: Please see discharge summary for a list of discharge medications.  Relevant Imaging Results:  Relevant Lab Results:   Additional Information SS3 [id number]  Althea CharonAshley C Yashua Bracco, LCSW

## 2016-09-22 NOTE — Progress Notes (Signed)
  Progress Note    09/22/2016 10:44 AM 4 Days Post-Op  Subjective:  No acute issues  Vitals:   09/22/16 0810 09/22/16 0938  BP:    Pulse:    Resp: 16   Temp:    SpO2: 93% (!) 83%    Physical Exam: aaox3 Non labored respirations Left foot is warm, gangrenous great toe Left leg wounds are cdi  CBC    Component Value Date/Time   WBC 13.6 (H) 09/22/2016 0248   RBC 3.38 (L) 09/22/2016 0248   HGB 9.5 (L) 09/22/2016 0248   HGB 10.8 (L) 07/01/2016 1445   HCT 29.4 (L) 09/22/2016 0248   HCT 32.9 (L) 07/01/2016 1445   PLT 325 09/22/2016 0248   PLT 340 07/01/2016 1445   MCV 87.0 09/22/2016 0248   MCV 88 07/01/2016 1445   MCH 28.1 09/22/2016 0248   MCHC 32.3 09/22/2016 0248   RDW 14.0 09/22/2016 0248   RDW 15.5 (H) 07/01/2016 1445   LYMPHSABS 3.0 07/01/2016 1445   MONOABS 0.3 05/25/2016 0349   EOSABS 0.4 07/01/2016 1445   BASOSABS 0.0 07/01/2016 1445    BMET    Component Value Date/Time   NA 136 09/22/2016 0248   NA 140 04/14/2016 1431   K 2.7 (LL) 09/22/2016 0248   CL 98 (L) 09/22/2016 0248   CO2 31 09/22/2016 0248   GLUCOSE 110 (H) 09/22/2016 0248   BUN 15 09/22/2016 0248   BUN 19 04/14/2016 1431   CREATININE 1.06 (H) 09/22/2016 0248   CALCIUM 8.8 (L) 09/22/2016 0248   GFRNONAA 55 (L) 09/22/2016 0248   GFRAA >60 09/22/2016 0248    INR    Component Value Date/Time   INR 1.09 09/10/2016 1450     Intake/Output Summary (Last 24 hours) at 09/22/16 1044 Last data filed at 09/22/16 0400  Gross per 24 hour  Intake              210 ml  Output             1450 ml  Net            -1240 ml     Assessment: 66 y.o.femaleis s/p: Washout and partial closure of left groin wound, closure of left thigh and below knee wounds (09/12/2016), left fem-pop graft removal and patch angioplasty of left common femoral and below knee popliteal artery (09/10/16) Aortogram on 9.5.  Gracilis flap coverage of left groin by Dr. Leta Baptisthimmappa on 9.7  Plan: Appreciate Dr. Leta Baptisthimmappa  treatment of groin Will investigate placement Defer left lower extremity bypass until she is stronger.    Keniesha Adderly C. Randie Heinzain, MD [facility] [place] Office: [phone] Pager: [phone]  09/22/2016 10:44 AM

## 2016-09-22 NOTE — Progress Notes (Signed)
CRITICAL VALUE ALERT  Critical Value:  K of 2.7  Date & Time Notied:  09/22/16, 04:29  Provider Notified: yes Orders Received/Actions taken: To give PO 20 meq K once and to draw K lab at 12:00 noon.

## 2016-09-22 NOTE — Progress Notes (Signed)
Physical Therapy Treatment Patient Details Name: Andrea [name] B Strycharz MRN: [id number] DOB: 1950/05/03 Today's Date: 09/22/2016    History of Present Illness 66 year old female s/p redo Lt fempop BPG with I&D 8/30, partial closure and washout 9/1. PMHx: Lt fem-below knee popliteal artery BPG and Lt fem endarterectomy  07/20/16, CABG, fibromyalgia, urinary incontinence, right fem-below knee popliteal artery BPG with prior infection and debridement.  Pt now s/p L gracilis muscle flap to L groin 09/18/16.    PT Comments    Patient continues to be confused and oriented to person and place ([place] Pt was able to ambulate farther today with mod/max A and c/o pain 2/10 in L LE. Pt continues to demonstrate balance deficits and is not safe for OOB mobility without mod/max A. Current plan remains appropriate.    Follow Up Recommendations  SNF;Supervision for mobility/OOB     Equipment Recommendations  None recommended by PT    Recommendations for Other Services OT consult     Precautions / Restrictions Precautions Precautions: Fall Precaution Comments: pt reports several recent falls    Mobility  Bed Mobility               General bed mobility comments: Pt OOB in chair upon arrival  Transfers Overall transfer level: Needs assistance Equipment used: Rolling walker (2 wheeled) Transfers: Sit to/from [employer] CorporationStand;Stand Pivot Transfers Sit to Stand: Mod assist Stand pivot transfers: Mod assist;Max assist       General transfer comment: mod A to power up into standing and for balance upon stand and max A required when pivoting ot BSC as pt has posterior bias and LOB when stepping backwards  Ambulation/Gait Ambulation/Gait assistance: Mod assist;Max assist Ambulation Distance (Feet): 90 Feet Assistive device: Rolling walker (2 wheeled) Gait Pattern/deviations: Trunk flexed;Decreased step length - right;Step-through pattern;Narrow base of  support;Decreased stance time - left;Decreased stride length     General Gait Details: multimodal cues for posture and increased bilat step length; assist for balance and management of RW   Stairs            Wheelchair Mobility    Modified Rankin (Stroke Patients Only)       Balance Overall balance assessment: Needs assistance Sitting-balance support: No upper extremity supported;Feet supported Sitting balance-Leahy Scale: Fair     Standing balance support: Bilateral upper extremity supported Standing balance-Leahy Scale: Poor Standing balance comment: B UE support and external assistance required.                             Cognition Arousal/Alertness: Awake/alert Behavior During Therapy: WFL for tasks assessed/performed Overall Cognitive Status: Impaired/Different from baseline Area of Impairment: Safety/judgement;Problem solving;Orientation;Memory;Attention;Following commands;Awareness                 Orientation Level: Disoriented to;Situation;Place;Time Current Attention Level: Sustained Memory: Decreased short-term memory Following Commands: Follows one step commands consistently;Follows one step commands with increased time Safety/Judgement: Decreased awareness of safety;Decreased awareness of deficits Awareness: Intellectual Problem Solving: Slow processing;Decreased initiation;Difficulty sequencing;Requires tactile cues;Requires verbal cues General Comments: pt continues to be confused; able to state she is in [place] but not in the hospital      Exercises      General Comments General comments (skin integrity, edema, etc.): minimal amount of drainage from L LE would--RN notified and presen to assess pt end of session; unable to get accurate SpO2 reading due to poor waveform  Pertinent Vitals/Pain Pain Assessment: 0-10 Pain Score: 2  Pain Location: L LE Pain Descriptors / Indicators: Sore Pain Intervention(s): Limited  activity within patient's tolerance;Monitored during session;Repositioned    Home Living                      Prior Function            PT Goals (current goals can now be found in the care plan section) Acute Rehab PT Goals PT Goal Formulation: With patient Time For Goal Achievement: 09/25/16 Potential to Achieve Goals: Fair Progress towards PT goals: Progressing toward goals    Frequency    Min 3X/week      PT Plan Current plan remains appropriate    Co-evaluation              AM-PAC PT "6 Clicks" Daily Activity  Outcome Measure  Difficulty turning over in bed (including adjusting bedclothes, sheets and blankets)?: A Lot Difficulty moving from lying on back to sitting on the side of the bed? : Unable Difficulty sitting down on and standing up from a chair with arms (e.g., wheelchair, bedside commode, etc,.)?: Unable Help needed moving to and from a bed to chair (including a wheelchair)?: A Lot Help needed walking in hospital room?: A Lot Help needed climbing 3-5 steps with a railing? : Total 6 Click Score: 9    End of Session Equipment Utilized During Treatment: Gait belt Activity Tolerance: Patient tolerated treatment well Patient left: with call bell/phone within reach;in chair;with chair alarm set Nurse Communication: Mobility status PT Visit Diagnosis: Difficulty in walking, not elsewhere classified (R26.2);History of falling (Z91.81)     Time: 1340-1430 PT Time Calculation (min) (ACUTE ONLY): 50 min  Charges:  $Gait Training: 8-22 mins $Therapeutic Activity: 23-37 mins                    G Codes:       Erline Levine, PTA Pager: [phone]     Carolynne Edouard 09/22/2016, 2:45 PM

## 2016-09-22 NOTE — Progress Notes (Signed)
Clinical Social Worker is following patient for support and discharge needs. [facility] Facility made aware of patients pending discharge and to start authorization through patients insurance.      Marrianne MoodAshley Lunna Vogelgesang, MSW,  [employer]LCSWA [phone]

## 2016-09-23 LAB — BASIC METABOLIC PANEL
ANION GAP: 10 (ref 5–15)
BUN: 13 mg/dL (ref 6–20)
CHLORIDE: 104 mmol/L (ref 101–111)
CO2: 25 mmol/L (ref 22–32)
CREATININE: 1.09 mg/dL — AB (ref 0.44–1.00)
Calcium: 8 mg/dL — ABNORMAL LOW (ref 8.9–10.3)
GFR calc non Af Amer: 53 mL/min — ABNORMAL LOW (ref >60)
Glucose, Bld: 114 mg/dL — ABNORMAL HIGH (ref 65–99)
POTASSIUM: 2.8 mmol/L — AB (ref 3.5–5.1)
SODIUM: 139 mmol/L (ref 135–145)

## 2016-09-23 LAB — CBC
HCT: 25.2 % — ABNORMAL LOW (ref 36.0–46.0)
HEMOGLOBIN: 8 g/dL — AB (ref 12.0–15.0)
MCH: 27.9 pg (ref 26.0–34.0)
MCHC: 31.7 g/dL (ref 30.0–36.0)
MCV: 87.8 fL (ref 78.0–100.0)
Platelets: 338 10*3/uL (ref 150–400)
RBC: 2.87 MIL/uL — AB (ref 3.87–5.11)
RDW: 14.2 % (ref 11.5–15.5)
WBC: 8.3 10*3/uL (ref 4.0–10.5)

## 2016-09-23 LAB — GLUCOSE, CAPILLARY
GLUCOSE-CAPILLARY: 119 mg/dL — AB (ref 65–99)
GLUCOSE-CAPILLARY: 185 mg/dL — AB (ref 65–99)
Glucose-Capillary: 161 mg/dL — ABNORMAL HIGH (ref 65–99)

## 2016-09-23 MED ORDER — POTASSIUM CHLORIDE CRYS ER 20 MEQ PO TBCR
20.0000 meq | EXTENDED_RELEASE_TABLET | Freq: Two times a day (BID) | ORAL | Status: DC
  Administered 2016-09-23 – 2016-09-24 (×2): 20 meq via ORAL
  Filled 2016-09-23 (×2): qty 1

## 2016-09-23 MED ORDER — MAGNESIUM SULFATE 2 GM/50ML IV SOLN
2.0000 g | Freq: Once | INTRAVENOUS | Status: AC
  Administered 2016-09-23: 2 g via INTRAVENOUS
  Filled 2016-09-23: qty 50

## 2016-09-23 MED ORDER — SODIUM CHLORIDE 0.9 % IV BOLUS (SEPSIS)
1000.0000 mL | Freq: Once | INTRAVENOUS | Status: AC
  Administered 2016-09-23: 1000 mL via INTRAVENOUS

## 2016-09-23 NOTE — Progress Notes (Addendum)
CSW was asked by the daytime CSW to send the D/C summary [facility] via the hub once the MD has signed the D/C summary and then consult with the RN and CSW is to call PT[facility] when pt is ready for D/C.  D/C summary was signed, CSW consulted with RN and called [facility] for transport.  6:00 PM CSW received call from RN stating PA has not signed the D/C "orders" and that the PA states PA will not be out of surgery for two hours (approx 8pm) and that the PA will sign D/C orders at that time.    CSW then canceled PTAR and attempted to contact family (sister Josem KaufmannBronda Coleman at ph: [phone]) but pt's sister's phone was not in good working order.  6:08 PM CSW called pt's room and spoke to the pt whose sister is at bedside.  Pt will inform her sister D/C will be after 8pm.  6:22 PM CSW received call from RN reporting provider called and stated D/C will be for 09/24/16 instead of 9/12.  RN notified pt/family and will call [facility] SN[facility] to update.  CSW signing off, as social work intervention is no longer needed.  CSW will continue to follow for D/C needs.  Dorothe PeaJonathan F. Martice Doty, LCSW, LCAS, CSI Clinical Social Worker Ph: [phone]

## 2016-09-23 NOTE — Progress Notes (Signed)
Nutrition Consult/Brief Note  RD consulted for poor PO intake.  Wt Readings from Last 15 Encounters:  09/23/16 126 lb 11.2 oz (57.5 kg)  09/10/16 126 lb 1.6 oz (57.2 kg)  09/09/16 128 lb 3.2 oz (58.2 kg)  08/28/16 135 lb 12.8 oz (61.6 kg)  08/21/16 131 lb 9.6 oz (59.7 kg)  08/14/16 132 lb 9.6 oz (60.1 kg)  07/31/16 136 lb 3.2 oz (61.8 kg)  07/20/16 138 lb 6.4 oz (62.8 kg)  07/17/16 136 lb (61.7 kg)  07/16/16 136 lb 1.6 oz (61.7 kg)  07/10/16 135 lb (61.2 kg)  07/01/16 137 lb 12.8 oz (62.5 kg)  06/26/16 138 lb 11.2 oz (62.9 kg)  06/12/16 130 lb 1.6 oz (59 kg)  06/12/16 132 lb (59.9 kg)   Body mass index is 23.17 kg/m. Patient meets criteria for Normal weight based on current BMI.   Pt reports a good appetite. Pt eating breakfast upon visit. Meal consumption ~ 90%. Labs and medications reviewed.   No nutrition interventions warranted at this time. If nutrition issues arise, please consult RD.   [name] ChattersKatie Kashlynn Kundert, RD, LDN Pager #: [phone] After-Hours Pager #: [phone]

## 2016-09-23 NOTE — Social Work (Signed)
CSW was advised by SNF that they have received auth for SNF placement.  CSW will f/u for disposition [facility]  Keene BreathPatricia Janera Peugh, LCSW Clinical Social Worker [phone]

## 2016-09-23 NOTE — Progress Notes (Signed)
  Progress Note    09/23/2016 11:44 AM 5 Days Post-Op  Subjective:  No acute issues  Vitals:   09/23/16 0615 09/23/16 0800  BP: (!) 156/43 (!) 138/37  Pulse:    Resp:    Temp: 97.9 F (36.6 C) 98.6 F (37 C)  SpO2:      Physical Exam: aaox3 Non labored respirations Left foot is warm, gangrenous great toe Left leg wounds are cdi, drain has been removed  CBC    Component Value Date/Time   WBC 8.3 09/23/2016 0251   RBC 2.87 (L) 09/23/2016 0251   HGB 8.0 (L) 09/23/2016 0251   HGB 10.8 (L) 07/01/2016 1445   HCT 25.2 (L) 09/23/2016 0251   HCT 32.9 (L) 07/01/2016 1445   PLT 338 09/23/2016 0251   PLT 340 07/01/2016 1445   MCV 87.8 09/23/2016 0251   MCV 88 07/01/2016 1445   MCH 27.9 09/23/2016 0251   MCHC 31.7 09/23/2016 0251   RDW 14.2 09/23/2016 0251   RDW 15.5 (H) 07/01/2016 1445   LYMPHSABS 3.0 07/01/2016 1445   MONOABS 0.3 05/25/2016 0349   EOSABS 0.4 07/01/2016 1445   BASOSABS 0.0 07/01/2016 1445    BMET    Component Value Date/Time   NA 139 09/23/2016 0251   NA 140 04/14/2016 1431   K 2.8 (L) 09/23/2016 0251   CL 104 09/23/2016 0251   CO2 25 09/23/2016 0251   GLUCOSE 114 (H) 09/23/2016 0251   BUN 13 09/23/2016 0251   BUN 19 04/14/2016 1431   CREATININE 1.09 (H) 09/23/2016 0251   CALCIUM 8.0 (L) 09/23/2016 0251   GFRNONAA 53 (L) 09/23/2016 0251   GFRAA >60 09/23/2016 0251    INR    Component Value Date/Time   INR 1.09 09/10/2016 1450     Intake/Output Summary (Last 24 hours) at 09/23/16 1144 Last data filed at 09/23/16 0900  Gross per 24 hour  Intake              120 ml  Output             1600 ml  Net            -1480 ml     Assessment: 66 y.o.femaleis s/p: Washout and partial closure of left groin wound, closure of left thigh and below knee wounds (09/12/2016), left fem-pop graft removal and patch angioplasty of left common femoral and below knee popliteal artery (09/10/16) Aortogram on 9.5.  Gracilis flap coverage of left groin by  Dr. Leta Baptisthimmappa on 9.7 Plan: Nutrition to eval- no interventions dispo will be snf Will f/u in office in 2-3 weeks on discharge for staple removal and consideration of left EIA-bk popliteal bypass via obturator foramen Dr. Leta Baptisthimmappa has arranged f/u Replete lytes  Hannah Strader C. Randie Heinzain, MD [facility] [place] Office: [phone] Pager: [phone]  09/23/2016 11:44 AM

## 2016-09-23 NOTE — Social Work (Signed)
Clinical Social Worker facilitated patient discharge including contacting patient family and facility to confirm patient discharge plans.  Clinical information faxed to facility and family agreeable with plan.    CSW  will arrange ambulance transport via [facility] once DC Summary is complete.    RN to call [phone] to give report prior to discharge.  Clinical Social Worker will sign off for now as social work intervention is no longer needed. Please consult us again if new need arises.  Keene BreathPatricia Asriel Westrup, LCSW Clinical Social Worker [phone]

## 2016-09-23 NOTE — Social Work (Signed)
Evening CSW ([phone])  will assist with [facility].  CSW awaiting DC summary.  CSW spoke with SNF and they are aware.  Keene BreathPatricia Vanessa Kampf, LCSW Clinical Social Worker [phone]

## 2016-09-23 NOTE — Social Work (Signed)
CSW contacted [facility] to see if they received insurance auth for SNF placement. SNF will f/u and call CSW back to confirm.  CSW will continue to follow.  Keene BreathPatricia Sharley Keeler, LCSW Clinical Social Worker [phone]

## 2016-09-23 NOTE — Discharge Summary (Addendum)
Physician Discharge Summary  Patient ID: Andrea [name] MRN: [id number] DOB/AGE: 64-Aug-1952 64 y.o.  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 Irrigation and drainage of left groin hematoma, Excision of left femoropopliteal graft, Bovine patch angioplasty left common femoral artery, Bovine patch angioplasty left below-the-knee popliteal artery  Thrombectomy of left anterior tibial artery   2.Washout and partial closure of left groin wound, closure of left thigh and below knee wounds  3. Abdominal aortogram with bilateral lower extremity runoff, second order catheterization left external iliac artery, ultrasound right groin   4. Left gracilis muscle flap to left groin  Allergies as of 09/24/2016      Reactions   Naproxen Itching   Tramadol Hcl Itching      Medication List    STOP taking these medications   acetaminophen-codeine 300-30 MG tablet Commonly known as:  TYLENOL #3   clopidogrel 75 MG tablet Commonly known as:  PLAVIX   metFORMIN 1000 MG tablet Commonly known as:  GLUCOPHAGE     TAKE these medications   albuterol 108 (90 Base) MCG/ACT inhaler Commonly known as:  PROVENTIL HFA;VENTOLIN HFA Inhale 2 puffs into the lungs every 6 (six) hours as needed for wheezing or shortness of breath.   aspirin EC 81 MG tablet Take 1 tablet (81 mg total) by mouth daily.   atorvastatin 80 MG tablet Commonly known as:  LIPITOR Take 1 tablet (80 mg total) by mouth daily. What changed:  when to take this   cyclobenzaprine 5 MG tablet Commonly known as:  FLEXERIL Take 5 mg by mouth every 6 (six) hours as needed (pain/muscle spams.).   doxycycline 50 MG capsule Commonly known as:  VIBRAMYCIN Take 1 capsule (50 mg total) by mouth 2  (two) times daily.   DULoxetine 60 MG capsule Commonly known as:  CYMBALTA Take 1 capsule (60 mg total) by mouth daily.   Fluticasone-Salmeterol 100-50 MCG/DOSE Aepb Commonly known as:  ADVAIR Inhale 1 puff into the lungs 2 (two) times daily.   furosemide 20 MG tablet Commonly known as:  LASIX Take 1 tablet (20 mg total) by mouth daily.   gabapentin 300 MG capsule Commonly known as:  NEURONTIN Take 300 mg by mouth 3 (three) times daily.   hydrochlorothiazide 25 MG tablet Commonly known as:  HYDRODIURIL Take 25 mg by mouth daily.   HYDROcodone-acetaminophen 5-325 MG tablet Commonly known as:  NORCO/VICODIN Take 1 tablet by mouth every 6 (six) hours as needed for moderate pain.   ibuprofen 200 MG tablet Commonly known as:  ADVIL,MOTRIN Take 400 mg by mouth every 6 (six) hours as needed for mild pain.   Insulin Glargine 100 UNIT/ML Solostar Pen Commonly known as:  LANTUS SOLOSTAR Inject 30 Units into the skin at bedtime.   lisinopril 5 MG tablet Commonly known as:  PRINIVIL,ZESTRIL Take 1 tablet (5 mg total) by mouth daily.   metoprolol tartrate 25 MG tablet Commonly known as:  LOPRESSOR Take 0.5 tablets (12.5 mg total) by mouth 2 (two) times daily.   oxybutynin 5 MG tablet Commonly known as:  DITROPAN TAKE 1 TABLET BY MOUTH 2 TIMES DAILY.   pantoprazole 20 MG tablet Commonly known as:  PROTONIX Take 1 tablet (20 mg total) by mouth daily.   potassium chloride 10 MEQ tablet Commonly known as:  K-DUR Take 2 tablets (20 mEq total) by mouth daily. What changed:  how much to take   pregabalin 100 MG capsule Commonly known as:  LYRICA Take 1 capsule (100 mg total) by mouth 2 (two) times daily.            Discharge Care Instructions        Start     Ordered   09/24/16 0000  HYDROcodone-acetaminophen (NORCO/VICODIN) 5-325 MG tablet  Every 6 hours PRN    Question:  Supervising Provider  Answer:  Lemar Livings CHRISTOPHER   09/24/16 0717   09/24/16 0000   potassium chloride (K-DUR) 10 MEQ tablet  Daily     09/24/16 0717   09/24/16 0000  doxycycline (VIBRAMYCIN) 50 MG capsule  2 times daily    Question:  Supervising Provider  Answer:  Lemar Livings [facility]   09/24/16 0717       Discharged Condition: stable  Discharge instructions:   1.  Dry gauze to groin daily and prn 2.  Check potassium and supplement as needed (dose increased at dc) 3.  SSI-add Metformin back as tolerated 4.  Needs two more days of Doxycycline 4.  Boost tid   Hospital Course: admitted with infected graft that was removed. Groin covered with gracilis flap. Had angiogram to plan future bypass. Will discharge on aspirin, K supplementation, 2 days of doxycycline.  Consults: plastic surgery Treatment Team:  Maeola Harman, MD  Significant Diagnostic Studies: CBC CBC Latest Ref Rng & Units 09/23/2016 09/22/2016 09/15/2016  WBC 4.0 - 10.5 K/uL 8.3 13.6(H) 13.1(H)  Hemoglobin 12.0 - 15.0 g/dL 8.0(L) 9.5(L) 10.6(L)  Hematocrit 36.0 - 46.0 % 25.2(L) 29.4(L) 32.9(L)  Platelets 150 - 400 K/uL 338 325 333     BMET   COAG Lab Results  Component Value Date   INR 1.09 09/10/2016   INR 0.93 07/16/2016   INR 1.10 06/12/2016   No results found for: PTT  Disposition: 01-Home or Self Care     Contact information for follow-up providers    Glenna Fellows, MD. Schedule an appointment as soon as possible for a visit on 09/30/2016.   Specialty:  Plastic Surgery Why:  1 pm Contact information: [address] [place] [address] [phone]            Contact information for after-discharge care    Destination    [facility] Follow up.   Specialty:  Skilled Nursing Facility Contact information: [address] [place] [address] [phone]                  Signed:  Luanna Salk. Randie Heinz, MD [facility] Office: [phone] Pager:  [phone]   09/24/2016, 7:17 AM

## 2016-09-23 NOTE — Clinical Social Work Placement (Addendum)
   CLINICAL SOCIAL WORK PLACEMENT  NOTE  Date:  09/23/2016  Patient Details  Name: Andrea [name] B Woolum MRN: [id number] Date of Birth: 17-Jun-1950  Clinical Social Work is seeking post-discharge placement for this patient at the Skilled  Nursing Facility level of care (*CSW will initial, date and re-position this form in  chart as items are completed):  Yes   Patient/family provided with [facility] Clinical Social Work Department's list of facilities offering this level of care within the geographic area requested by the patient (or if unable, by the patient's family).  Yes   Patient/family informed of their freedom to choose among providers that offer the needed level of care, that participate in Medicare, Medicaid or managed care program needed by the patient, have an available bed and are willing to accept the patient.  Yes   Patient/family informed of [facility]'s ownership interest in [facility] and [facility], as well as of the fact that they are under no obligation to receive care at these facilities.  PASRR submitted to EDS on       PASRR number received on       Existing PASRR number confirmed on 09/22/16     FL2 transmitted to all facilities in geographic area requested by pt/family on       FL2 transmitted to all facilities within larger geographic area on 09/22/16     Patient informed that his/her managed care company has contracts with or will negotiate with certain facilities, including the following:            Patient/family informed of bed offers received.  [facility]     Physician recommends and patient chooses bed at      [facility] on 09/23/[facility].  Patient to be transferred to facility by [facility]     Patient family notified on 09/23/16 of transfer.  Name of family member notified:  Patient responsible for self  and sister at bedside, Josem KaufmannBronda Coleman    PHYSICIAN Please prepare  priority discharge summary, including medications, Please sign FL2, Please prepare prescriptions     Additional Comment:    _______________________________________________ Tresa MoorePatricia V Tiffinie Caillier, LCSW 09/23/2016, 4:43 PM

## 2016-09-24 ENCOUNTER — Telehealth: Payer: Self-pay | Admitting: Vascular Surgery

## 2016-09-24 LAB — GLUCOSE, CAPILLARY: Glucose-Capillary: 137 mg/dL — ABNORMAL HIGH (ref 65–99)

## 2016-09-24 MED ORDER — DOXYCYCLINE HYCLATE 50 MG PO CAPS: 50.0000 mg | ORAL_CAPSULE | Freq: Two times a day (BID) | ORAL | 0 refills | Status: DC

## 2016-09-24 MED ORDER — INFLUENZA VAC SPLIT QUAD 0.5 ML IM SUSY: 0.5000 mL | PREFILLED_SYRINGE | INTRAMUSCULAR | Status: DC

## 2016-09-24 MED ORDER — HYDROCODONE-ACETAMINOPHEN 5-325 MG PO TABS: 1.0000 | ORAL_TABLET | Freq: Four times a day (QID) | ORAL | 0 refills | Status: DC | PRN

## 2016-09-24 MED ORDER — POTASSIUM CHLORIDE ER 10 MEQ PO TBCR: 20.0000 meq | EXTENDED_RELEASE_TABLET | Freq: Every day | ORAL | 1 refills | Status: DC

## 2016-09-24 NOTE — Social Work (Signed)
Clinical Social Worker facilitated patient discharge including contacting patient family and facility to confirm patient discharge plans.  Clinical information faxed to facility and family agreeable with plan.    CSW  will arranged ambulance transport via [facility] to [facility] .    RN to call [phone] to give report prior to discharge.  Clinical Social Worker will sign off for now as social work intervention is no longer needed. Please consult us again if new need arises.  Keene BreathPatricia Lian Tanori, LCSW Clinical Social Worker [phone]

## 2016-09-24 NOTE — Care Management Note (Signed)
Case Management Note Donn PieriniKristi Saphronia Ozdemir RN, BSN Unit 4E-Case Manager [phone]  Patient Details  Name: Andrea [name] MRN: [id number] Date of Birth: [birth year]  Subjective/Objective:   Pt admitted with infected graft s/p I&D left groin hematoma, excision of left femoropopliteal bypass graft-- will return to OR on 9/1                 Action/Plan: PTA Pt lived at home- per PT/OT evals recommendations for SNF- CSW consulted for possible placement- CM will follow   Expected Discharge Date:  09/24/16               Expected Discharge Plan:  Skilled Nursing Facility  In-House Referral:  Clinical Social Work  Discharge planning Services  CM Consult  Post Acute Care Choice:    Choice offered to:     DME Arranged:    DME Agency:     HH Arranged:    HH Agency:     Status of Service:  Completed, signed off  If discussed at [employer] Length of Stay Meetings, dates discussed:  9/6, 9/11, 9/13  Discharge Disposition: skilled facility   Additional Comments:  09/24/16- 0900- Donn PieriniKristi Suprena Travaglini RN, CM- pt stable for discharge CSW following for placement needs- [facility]   09/21/16- 1000- Graclyn Lawther RN, CM- pt s/p muscle flap per plastics on 09/18/16- incisional wound VAC in place- plan to remove post op day 3- CSW following for SNF placement  Darrold SpanWebster, Xiomar Crompton Hall, RN 09/24/2016, 9:53 AM

## 2016-09-24 NOTE — Progress Notes (Signed)
Physical Therapy Treatment Patient Details Name: Andrea [name] MRN: [id number] DOB: [birth year] Today's Date: 09/24/2016    History of Present Illness 66 year old female s/p redo Lt fempop BPG with I&D 8/30, partial closure and washout 9/1. PMHx: Lt fem-below knee popliteal artery BPG and Lt fem endarterectomy  07/20/16, CABG, fibromyalgia, urinary incontinence, right fem-below knee popliteal artery BPG with prior infection and debridement.  Pt now s/p L gracilis muscle flap to L groin 09/18/16.    PT Comments    Pt remains slightly confused about the chain of events that will take place to get to SNF today. Pt continues to make progress towards her goals and is currently supervision for bed mobility, and mod A for transfers and ambulation of 125 feet with RW. PT requires skilled PT at rehab facility to continue to progress gait training and improve balance and endurance to safely navigate in her environment.    Follow Up Recommendations  SNF;Supervision for mobility/OOB     Equipment Recommendations  None recommended by PT    Recommendations for Other Services OT consult     Precautions / Restrictions Precautions Precautions: Fall Precaution Comments: pt reports several recent falls Restrictions Weight Bearing Restrictions: No    Mobility  Bed Mobility Overal bed mobility: Needs Assistance Bed Mobility: Supine to Sit     Supine to sit: Supervision     General bed mobility comments: supervision for safety, vc for scooting hips to EoB  Transfers Overall transfer level: Needs assistance Equipment used: Rolling walker (2 wheeled);1 person hand held assist Transfers: Sit to/from [employer];Stand Pivot Transfers Sit to Stand: Min assist Stand pivot transfers: Mod assist       General transfer comment: minA for powerup and steadying in standing to RW, modA for stand pivot to/from [facility] with HHA  Ambulation/Gait Ambulation/Gait assistance: Mod assist   Assistive device:  Rolling walker (2 wheeled) Gait Pattern/deviations: Trunk flexed;Decreased step length - right;Step-through pattern;Narrow base of support;Decreased stance time - left;Decreased stride length;Steppage     General Gait Details: modA for steadying with initial gait with increased L foot pain, pt initially utilizes a L LE steppage pattern with vc able to obtain more normal gait pattern, vc for staying inside RW       Balance Overall balance assessment: Needs assistance Sitting-balance support: No upper extremity supported;Feet supported Sitting balance-Leahy Scale: Fair     Standing balance support: Bilateral upper extremity supported Standing balance-Leahy Scale: Poor Standing balance comment: B UE support and external assistance required.                             Cognition Arousal/Alertness: Awake/alert Behavior During Therapy: WFL for tasks assessed/performed Overall Cognitive Status: Impaired/Different from baseline Area of Impairment: Safety/judgement;Problem solving;Orientation;Memory;Attention;Following commands;Awareness                 Orientation Level: Disoriented to;Situation;Time;Place Current Attention Level: Sustained Memory: Decreased short-term memory Following Commands: Follows one step commands consistently;Follows one step commands with increased time Safety/Judgement: Decreased awareness of safety;Decreased awareness of deficits Awareness: Intellectual Problem Solving: Slow processing;Decreased initiation;Difficulty sequencing;Requires tactile cues;Requires verbal cues General Comments: pt continues to be confused; know she is going someplace else for rehab but is confused about how she is getting there despite being told previously              Pertinent Vitals/Pain Pain Assessment: 0-10 Pain Score: 7  Pain Location: L great toe Pain Descriptors / Indicators: Sore;Aching  VSS           PT Goals (current goals can now be found  in the care plan section) Acute Rehab PT Goals PT Goal Formulation: With patient Time For Goal Achievement: 09/25/16 Potential to Achieve Goals: Fair Progress towards PT goals: Progressing toward goals    Frequency    Min 3X/week      PT Plan Current plan remains appropriate       AM-PAC PT "6 Clicks" Daily Activity  Outcome Measure  Difficulty turning over in bed (including adjusting bedclothes, sheets and blankets)?: A Little Difficulty moving from lying on back to sitting on the side of the bed? : Unable Difficulty sitting down on and standing up from a chair with arms (e.g., wheelchair, bedside commode, etc,.)?: Unable Help needed moving to and from a bed to chair (including a wheelchair)?: A Lot Help needed walking in hospital room?: A Little Help needed climbing 3-5 steps with a railing? : Total 6 Click Score: 11    End of Session Equipment Utilized During Treatment: Gait belt Activity Tolerance: Patient tolerated treatment well Patient left: with call bell/phone within reach;in chair;with chair alarm set Nurse Communication: Mobility status PT Visit Diagnosis: Difficulty in walking, not elsewhere classified (R26.2);History of falling (Z91.81)     Time: 1610-9604 PT Time Calculation (min) (ACUTE ONLY): 36 min  Charges:  $Gait Training: 8-22 mins $Therapeutic Activity: 8-22 mins                    G Codes:       Andrea [name] PT, DPT Acute Rehabilitation  [phone] Pager [phone]     Andrea [name] 09/24/2016, 9:42 AM

## 2016-09-24 NOTE — Telephone Encounter (Signed)
-----   Message from Sharee PimpleMarilyn K McChesney, RN sent at 09/24/2016  9:15 AM EDT ----- Regarding: 2 weeks with Dr. Randie Heinzain for stapel removal   ----- Message ----- From: Maeola Harmanain, Brandon Christopher, MD Sent: 09/23/2016   5:32 PM To: [address] Charge Pool  Andrea Brookingoni B Mcneeley [id number] Andrea [name], Andrea [name]  F/u in 2 weeks with me for staple removal and to plan future bypass surgery.   brandon

## 2016-09-24 NOTE — Telephone Encounter (Signed)
Sched appt 10/09/16 at 2:15. Spoke to pt.

## 2016-09-25 ENCOUNTER — Ambulatory Visit: Payer: BLUE CROSS/BLUE SHIELD | Admitting: Vascular Surgery

## 2016-09-25 ENCOUNTER — Encounter (HOSPITAL_COMMUNITY): Payer: BLUE CROSS/BLUE SHIELD

## 2016-09-25 NOTE — Progress Notes (Signed)
Subjective: Patient is s/p surgery from a gracilis flap of the left groin.  Objective: Vital signs in last 24 hours:  Patient in bed, staples in place and no sign of swelling, redness or fluid retention.     Weight change:  Last BM Date: 09/23/16  Intake/Output from previous day: 09/13 0701 - 09/14 0700 In: 240 [P.O.:240] Out: -  Intake/Output this shift: Total I/O In: 240 [P.O.:240] Out: 1200 [Urine:1200]  General appearance: no distress Skin: Skin color, texture, turgor normal. No rashes or lesions  Lab Results:  Recent Labs  09/23/16 0251  WBC 8.3  HGB 8.0*  HCT 25.2*  PLT 338   BMET  Recent Labs  09/22/16 1131 09/23/16 0251  NA  --  139  K 3.0* 2.8*  CL  --  104  CO2  --  25  GLUCOSE  --  114*  BUN  --  13  CREATININE  --  1.09*  CALCIUM  --  8.0*    Studies/Results: No results found.  Medications: I have reviewed the patient's current medications.  Assessment/Plan: Discharge per primary team.  Follow up in the office with Dr. Leta Baptist in one week.  LOS: 14 days    Peggye Form 09/25/2016

## 2016-10-01 ENCOUNTER — Telehealth: Payer: Self-pay | Admitting: Family Medicine

## 2016-10-01 NOTE — Telephone Encounter (Signed)
Call placed to [facility] #[phone], in order to speak with nurse regarding patient's discharge plan. Transferred to Sholanda's (discharge coordinator/planner) extension but no answer. Left Jeanell Sparrow a message to return my call at [phone].

## 2016-10-06 ENCOUNTER — Telehealth: Payer: Self-pay | Admitting: Cardiovascular Disease

## 2016-10-06 NOTE — Telephone Encounter (Signed)
Left message on patient's cell phone that I received message from her sister about her appointment on 10/1 with Dr. Elease Hashimoto but DPR states not to talk to anyone but the patient. I advised that I am happy to change the appointment but that we need to speak to the patient for permission to talk with her sister.

## 2016-10-06 NOTE — Telephone Encounter (Signed)
New message    Patient sister Simon Rhein called , she wants to know if pt can been seen at a later date. Doesn't think appt necessary at this time. Please advise.

## 2016-10-08 NOTE — Telephone Encounter (Signed)
Spoke with patient's sister, Simon Rhein, and advised that I do not have a DPR on file that would allow me to speak with her. I advised that I left a message for the patient on 9/25 asking her to call back and give me permission to speak with her sister. Simon Rhein states that she is going to try to call the patient to advise her to call us.

## 2016-10-08 NOTE — Telephone Encounter (Signed)
Spoke with patient who asked to reschedule her appointment due to she remains in rehab for her lower extremity pain and slow wound healing. I rescheduled her appointment and asked that she complete a DPR at next visit giving [place] permission to talk with her sister or any other family member she would like [place] to speak to. She verbalized understanding and thanked me for my help.

## 2016-10-08 NOTE — Telephone Encounter (Signed)
Follow up   Pt sister calling to speak to rn   She want to know if her sister needs to keep or cancel appt  With Dr.Nasher

## 2016-10-09 ENCOUNTER — Ambulatory Visit (INDEPENDENT_AMBULATORY_CARE_PROVIDER_SITE_OTHER): Payer: Self-pay | Admitting: Vascular Surgery

## 2016-10-09 ENCOUNTER — Encounter: Payer: Self-pay | Admitting: *Deleted

## 2016-10-09 ENCOUNTER — Encounter: Payer: Self-pay | Admitting: Vascular Surgery

## 2016-10-09 ENCOUNTER — Other Ambulatory Visit: Payer: Self-pay | Admitting: *Deleted

## 2016-10-09 VITALS — BP 106/42 | HR 55 | Temp 99.3°F | Resp 18 | Ht 62.0 in | Wt 124.0 lb

## 2016-10-09 DIAGNOSIS — T8189XD Other complications of procedures, not elsewhere classified, subsequent encounter: Secondary | ICD-10-CM

## 2016-10-09 DIAGNOSIS — I739 Peripheral vascular disease, unspecified: Secondary | ICD-10-CM

## 2016-10-09 NOTE — Progress Notes (Signed)
Patient ID: Andrea [name], female   DOB: Nov 25, 1950, 66 y.o.   MRN: [id number]  Reason for Consult: PAD (2 week removal and plan for bypass)   Referred by Jaclyn Shaggy, MD  Subjective:     HPI:  Andrea [name] is a 66 y.o. female follows up from recent hospitalization where she had excision of her left femoral to below-knee popliteal graft and ultimately require flap coverage of her left groin wound. She has had a few staples taken out of the left groin wound by Dr. Leta Baptist and now presents for follow-up evaluation. She unfortunately is having worsening pain in her left foot as well as ischemic changes to the first and second toes on the left. Her right leg is without issue at this time. She still has staples in her below-knee above-the-knee and groin incisions. She continues to be a nonsmoker.  She is taking her aspirin and statin.  Past Medical History:  Diagnosis Date  . Asthma   . Coronary artery disease   . Diabetes mellitus without complication (HCC)   . Dyspnea   . Fibromyalgia   . GERD (gastroesophageal reflux disease)   . High cholesterol   . Hypertension   . PONV (postoperative nausea and vomiting)   . PVD (peripheral vascular disease) (HCC)   . Urinary frequency   . Urinary incontinence    Family History  Problem Relation Age of Onset  . Stroke Mother    Past Surgical History:  Procedure Laterality Date  . ABDOMINAL AORTOGRAM W/LOWER EXTREMITY N/A 03/25/2016   Procedure: Abdominal Aortogram w/Lower Extremity;  Surgeon: Maeola Harman, MD;  Location: [facility] INVASIVE CV LAB;  Service: Cardiovascular;  Laterality: N/A;  . AORTOGRAM N/A 09/16/2016   Procedure: AORTOGRAM WITH LEFT LOWER EXTREMITY RUNOFF;  Surgeon: Sherren Kerns, MD;  Location: [facility] OR;  Service: Vascular;  Laterality: N/A;  . APPLICATION OF WOUND VAC Right 06/14/2016   Procedure: APPLICATION OF WOUND VAC;  Surgeon: Sherren Kerns, MD;  Location: [facility] OR;  Service: Vascular;  Laterality:  Right;  . CORONARY ARTERY BYPASS GRAFT    . CORONARY STENT INTERVENTION N/A 04/15/2016   Procedure: Coronary Stent Intervention;  Surgeon: Lennette Bihari, MD;  Location: [facility] INVASIVE CV LAB;  Service: Cardiovascular;  Laterality: N/A;  . ENDARTERECTOMY FEMORAL Right 05/21/2016   Procedure: RIGHT FEMORAL ENDARTERECTOMY;  Surgeon: Maeola Harman, MD;  Location: [facility] OR;  Service: Vascular;  Laterality: Right;  . ENDARTERECTOMY FEMORAL Left 07/20/2016   Procedure: LEFT FEMORAL ENDARTERECTOMY;  Surgeon: Maeola Harman, MD;  Location: [facility] OR;  Service: Vascular;  Laterality: Left;  . FEMORAL BYPASS Left 07/20/2016   Left common femoral endarterectomy  . FEMORAL-POPLITEAL BYPASS GRAFT Right 05/21/2016   Procedure: RIGHT FEMORAL-BELOW KNEE POPLITEAL ARTERY BYPASS GRAFT USING PROPATEN GORETEX GRAFT;  Surgeon: Maeola Harman, MD;  Location: [facility] OR;  Service: Vascular;  Laterality: Right;  . FEMORAL-POPLITEAL BYPASS GRAFT Left 07/20/2016   Procedure: LEFT FEMORAL TO BELOW KNEE POPLITEAL ARTERY  BYPASS GRAFT;  Surgeon: Maeola Harman, MD;  Location: [facility] OR;  Service: Vascular;  Laterality: Left;  . GROIN DEBRIDEMENT Right 06/14/2016   Procedure: GROIN DEBRIDEMENT;  Surgeon: Sherren Kerns, MD;  Location: [facility] OR;  Service: Vascular;  Laterality: Right;  . I&D EXTREMITY N/A 09/12/2016   Procedure: Irrigation and Debridment Left Groin; Closure of Distal Incisions Left Leg; Medial Calf and Medial Thigh;  Surgeon: Maeola Harman, MD;  Location: [facility] OR;  Service: Vascular;  Laterality:  N/A;  . KNEE SURGERY    . LEFT HEART CATH AND CORS/GRAFTS ANGIOGRAPHY N/A 04/15/2016   Procedure: Left Heart Cath and Cors/Grafts Angiography;  Surgeon: Lennette Bihari, MD;  Location: [facility] INVASIVE CV LAB;  Service: Cardiovascular;  Laterality: N/A;  . MULTIPLE TOOTH EXTRACTIONS    . MUSCLE FLAP CLOSURE Left 09/18/2016   Procedure: LEFT GRACILIS MUSCLE FLAP TO LEFT GROIN;  Surgeon: Glenna Fellows, MD;  Location: [facility] OR;  Service: Plastics;  Laterality: Left;  . PATCH ANGIOPLASTY Left 09/10/2016   Procedure: Bovine PATCH ANGIOPLASTY Left Femoral Artery and Below Knee Popliteal Artery;  Surgeon: Fransisco Hertz, MD;  Location[facility] OR;  Service: Vascular;  Laterality: Left;  . REMOVAL OF GRAFT Left 09/10/2016   Procedure: Excision of Left Femoral-Popliteal Graft, Placement of Antibiotic Beads Left Leg;  Surgeon: Fransisco Hertz, MD;  Location: [facility] OR;  Service: Vascular;  Laterality: Left;    Short Social History:  Social History  Substance Use Topics  . Smoking status: Former Smoker    Packs/day: 0.25    Types: Cigarettes    Quit date: 04/04/2016  . Smokeless tobacco: Never Used  . Alcohol use No    Allergies  Allergen Reactions  . Naproxen Itching  . Tramadol Hcl Itching    Current Outpatient Prescriptions  Medication Sig Dispense Refill  . albuterol (PROVENTIL HFA;VENTOLIN HFA) 108 (90 Base) MCG/ACT inhaler Inhale 2 puffs into the lungs every 6 (six) hours as needed for wheezing or shortness of breath. 54 Inhaler 3  . aspirin EC 81 MG tablet Take 1 tablet (81 mg total) by mouth daily. 90 tablet 3  . atorvastatin (LIPITOR) 80 MG tablet Take 1 tablet (80 mg total) by mouth daily. (Patient taking differently: Take 80 mg by mouth at bedtime. ) 90 tablet 3  . cyclobenzaprine (FLEXERIL) 5 MG tablet Take 5 mg by mouth every 6 (six) hours as needed (pain/muscle spams.).   0  . DULoxetine (CYMBALTA) 60 MG capsule Take 1 capsule (60 mg total) by mouth daily. 60 capsule 1  . Fluticasone-Salmeterol (ADVAIR) 100-50 MCG/DOSE AEPB Inhale 1 puff into the lungs 2 (two) times daily. 180 each 3  . furosemide (LASIX) 20 MG tablet Take 1 tablet (20 mg total) by mouth daily. 30 tablet 1  . gabapentin (NEURONTIN) 300 MG capsule Take 300 mg by mouth 3 (three) times daily.  3  . hydrochlorothiazide (HYDRODIURIL) 25 MG tablet Take 25 mg by mouth daily.  0  . HYDROcodone-acetaminophen (NORCO/VICODIN)  5-325 MG tablet Take 1 tablet by mouth every 6 (six) hours as needed for moderate pain. 30 tablet 0  . ibuprofen (ADVIL,MOTRIN) 200 MG tablet Take 400 mg by mouth every 6 (six) hours as needed for mild pain.     . Insulin Glargine (LANTUS SOLOSTAR) 100 UNIT/ML Solostar Pen Inject 30 Units into the skin at bedtime. 5 pen 5  . lisinopril (PRINIVIL,ZESTRIL) 5 MG tablet Take 1 tablet (5 mg total) by mouth daily. 30 tablet 5  . metoprolol tartrate (LOPRESSOR) 25 MG tablet Take 0.5 tablets (12.5 mg total) by mouth 2 (two) times daily. 90 tablet 3  . oxybutynin (DITROPAN) 5 MG tablet TAKE 1 TABLET BY MOUTH 2 TIMES DAILY. 60 tablet 2  . pantoprazole (PROTONIX) 20 MG tablet Take 1 tablet (20 mg total) by mouth daily. 30 tablet 1  . potassium chloride (K-DUR) 10 MEQ tablet Take 2 tablets (20 mEq total) by mouth daily. 30 tablet 1  . pregabalin (LYRICA) 100 MG  capsule Take 1 capsule (100 mg total) by mouth 2 (two) times daily. 60 capsule 3  . doxycycline (VIBRAMYCIN) 50 MG capsule Take 1 capsule (50 mg total) by mouth 2 (two) times daily. (Patient not taking: Reported on 10/09/2016) 4 capsule 0   No current facility-administered medications for this visit.     Review of Systems  Constitutional:  Constitutional negative. Respiratory: Respiratory negative.  Cardiovascular: Positive for leg swelling.  GI: Gastrointestinal negative.  Musculoskeletal: Positive for leg pain.  Skin: Positive for wound.  Neurological: Neurological negative. Hematologic: Hematologic/lymphatic negative.  Psychiatric: Psychiatric negative.        Objective:  Objective   There were no vitals filed for this visit. There is no height or weight on file to calculate BMI.  Physical Exam  Constitutional: She is oriented to person, place, and time. She appears well-developed.  HENT:  Head: Normocephalic.  Eyes: Pupils are equal, round, and reactive to light.  Neck: Normal range of motion.  Cardiovascular: Normal rate.     Pulses:      Femoral pulses are 2+ on the right side. Monophasic pedal signals bilaterally  Pulmonary/Chest: Effort normal.  Abdominal: Soft.  Musculoskeletal: She exhibits edema.  Incisions with staples exhibit delayed healing, groin is in tact  Neurological: She is alert and oriented to person, place, and time.  Skin:  Gangrenous changes left 1st and 2nd toes All toes on left are ruborous  Psychiatric: She has a normal mood and affect. Her behavior is normal. Judgment and thought content normal.        Assessment/Plan:     66 year old female who has had a right lower extremity bypasses that is patent and she has no symptoms on that side. The left side she had a left femoral to popliteal bypass that wasn't infected and had to be removed and patch angioplasties were performed. She subsequently had a flap to close the groin wound on left. On hospitalization we performed left lower extremity angiogram consider a possible obturator bypass. Unfortunately at this time she appears to be nutritionally depleted continues to be at a nursing home and is having progressive gangrenous changes to her left foot. I discussed with him the options of obturator bypass versus salvage endovascular procedure versus primary above-knee amputation. At this time she does not want to proceed with primary amputation and I discussed with them that I think bypass surgery would be too much for her to tolerate this time and she could have very bad outcomes specifically since she would need graft she still has open wounds and has recently had an infection of the graft. Given that appears of the toes today I have ordered Bactrim and Betadine paint to her toes on the left. We will proceed with left lower extremity angiogram possible from a retrograde approach as well as a salvage attempt to stent her left SFA. If this works we'll proceed with left toes amputation versus transmetatarsal amputation the following day and if this  does not work we'll consider left above-knee amputation the following day. She demonstrates very good understanding we'll get her scheduled for the very near future.     Maeola Harman MD [facility]

## 2016-10-12 ENCOUNTER — Observation Stay (HOSPITAL_COMMUNITY)
Admission: AD | Admit: 2016-10-12 | Discharge: 2016-10-16 | Disposition: A | Discharge status: Skilled Nursing Facility | Payer: BLUE CROSS/BLUE SHIELD | Source: Ambulatory Visit | Attending: Vascular Surgery | Admitting: Vascular Surgery

## 2016-10-12 ENCOUNTER — Ambulatory Visit: Payer: BLUE CROSS/BLUE SHIELD | Admitting: Cardiovascular Disease

## 2016-10-12 ENCOUNTER — Encounter (HOSPITAL_COMMUNITY)
Admission: AD | Disposition: A | Discharge status: Skilled Nursing Facility | Payer: Self-pay | Source: Ambulatory Visit | Attending: Vascular Surgery

## 2016-10-12 DIAGNOSIS — I70262 Atherosclerosis of native arteries of extremities with gangrene, left leg: Secondary | ICD-10-CM | POA: Insufficient documentation

## 2016-10-12 DIAGNOSIS — E1140 Type 2 diabetes mellitus with diabetic neuropathy, unspecified: Secondary | ICD-10-CM | POA: Diagnosis not present

## 2016-10-12 DIAGNOSIS — I739 Peripheral vascular disease, unspecified: Secondary | ICD-10-CM | POA: Diagnosis present

## 2016-10-12 DIAGNOSIS — Z951 Presence of aortocoronary bypass graft: Secondary | ICD-10-CM | POA: Insufficient documentation

## 2016-10-12 DIAGNOSIS — N3281 Overactive bladder: Secondary | ICD-10-CM | POA: Insufficient documentation

## 2016-10-12 DIAGNOSIS — Z955 Presence of coronary angioplasty implant and graft: Secondary | ICD-10-CM | POA: Insufficient documentation

## 2016-10-12 DIAGNOSIS — J45909 Unspecified asthma, uncomplicated: Secondary | ICD-10-CM | POA: Insufficient documentation

## 2016-10-12 DIAGNOSIS — L609 Nail disorder, unspecified: Secondary | ICD-10-CM | POA: Diagnosis not present

## 2016-10-12 DIAGNOSIS — I2510 Atherosclerotic heart disease of native coronary artery without angina pectoris: Secondary | ICD-10-CM | POA: Insufficient documentation

## 2016-10-12 DIAGNOSIS — K219 Gastro-esophageal reflux disease without esophagitis: Secondary | ICD-10-CM | POA: Insufficient documentation

## 2016-10-12 DIAGNOSIS — I998 Other disorder of circulatory system: Secondary | ICD-10-CM | POA: Diagnosis not present

## 2016-10-12 DIAGNOSIS — Z794 Long term (current) use of insulin: Secondary | ICD-10-CM | POA: Diagnosis not present

## 2016-10-12 DIAGNOSIS — Z87891 Personal history of nicotine dependence: Secondary | ICD-10-CM | POA: Insufficient documentation

## 2016-10-12 DIAGNOSIS — M797 Fibromyalgia: Secondary | ICD-10-CM | POA: Insufficient documentation

## 2016-10-12 DIAGNOSIS — I10 Essential (primary) hypertension: Secondary | ICD-10-CM | POA: Insufficient documentation

## 2016-10-12 DIAGNOSIS — E1152 Type 2 diabetes mellitus with diabetic peripheral angiopathy with gangrene: Principal | ICD-10-CM | POA: Insufficient documentation

## 2016-10-12 DIAGNOSIS — D649 Anemia, unspecified: Secondary | ICD-10-CM | POA: Diagnosis not present

## 2016-10-12 HISTORY — PX: PERIPHERAL VASCULAR INTERVENTION: CATH118257

## 2016-10-12 HISTORY — PX: ABDOMINAL AORTOGRAM W/LOWER EXTREMITY: CATH118223

## 2016-10-12 LAB — CBC
HEMATOCRIT: 26.7 % — AB (ref 36.0–46.0)
Hemoglobin: 8.4 g/dL — ABNORMAL LOW (ref 12.0–15.0)
MCH: 27.9 pg (ref 26.0–34.0)
MCHC: 31.5 g/dL (ref 30.0–36.0)
MCV: 88.7 fL (ref 78.0–100.0)
PLATELETS: 269 10*3/uL (ref 150–400)
RBC: 3.01 MIL/uL — ABNORMAL LOW (ref 3.87–5.11)
RDW: 14.5 % (ref 11.5–15.5)
WBC: 9.8 10*3/uL (ref 4.0–10.5)

## 2016-10-12 LAB — POCT I-STAT, CHEM 8
BUN: 43 mg/dL — ABNORMAL HIGH (ref 6–20)
Calcium, Ion: 1.22 mmol/L (ref 1.15–1.40)
Chloride: 103 mmol/L (ref 101–111)
Creatinine, Ser: 1.5 mg/dL — ABNORMAL HIGH (ref 0.44–1.00)
Glucose, Bld: 183 mg/dL — ABNORMAL HIGH (ref 65–99)
HEMATOCRIT: 25 % — AB (ref 36.0–46.0)
HEMOGLOBIN: 8.5 g/dL — AB (ref 12.0–15.0)
POTASSIUM: 4.8 mmol/L (ref 3.5–5.1)
Sodium: 140 mmol/L (ref 135–145)
TCO2: 26 mmol/L (ref 22–32)

## 2016-10-12 LAB — GLUCOSE, CAPILLARY
GLUCOSE-CAPILLARY: 123 mg/dL — AB (ref 65–99)
Glucose-Capillary: 138 mg/dL — ABNORMAL HIGH (ref 65–99)

## 2016-10-12 LAB — CREATININE, SERUM
Creatinine, Ser: 1.35 mg/dL — ABNORMAL HIGH (ref 0.44–1.00)
GFR, EST AFRICAN AMERICAN: 47 mL/min — AB (ref >60)
GFR, EST NON AFRICAN AMERICAN: 41 mL/min — AB (ref >60)

## 2016-10-12 LAB — POCT ACTIVATED CLOTTING TIME: Activated Clotting Time: 175 seconds

## 2016-10-12 SURGERY — ABDOMINAL AORTOGRAM W/LOWER EXTREMITY
Anesthesia: LOCAL

## 2016-10-12 MED ORDER — CLOPIDOGREL BISULFATE 75 MG PO TABS
75.0000 mg | ORAL_TABLET | Freq: Every day | ORAL | Status: DC
  Administered 2016-10-13 – 2016-10-16 (×4): 75 mg via ORAL
  Filled 2016-10-12 (×3): qty 1

## 2016-10-12 MED ORDER — HEPARIN (PORCINE) IN NACL 2-0.9 UNIT/ML-% IJ SOLN
INTRAMUSCULAR | Status: AC | PRN
  Administered 2016-10-12: 1000 mL via INTRA_ARTERIAL

## 2016-10-12 MED ORDER — IODIXANOL 320 MG/ML IV SOLN
INTRAVENOUS | Status: DC | PRN
  Administered 2016-10-12: 65 mL via INTRA_ARTERIAL

## 2016-10-12 MED ORDER — FENTANYL CITRATE (PF) 100 MCG/2ML IJ SOLN
INTRAMUSCULAR | Status: AC
  Filled 2016-10-12: qty 2

## 2016-10-12 MED ORDER — ATORVASTATIN CALCIUM 80 MG PO TABS
80.0000 mg | ORAL_TABLET | Freq: Every day | ORAL | Status: DC
  Administered 2016-10-13 – 2016-10-16 (×4): 80 mg via ORAL
  Filled 2016-10-12 (×4): qty 1

## 2016-10-12 MED ORDER — LIDOCAINE HCL 2 % IJ SOLN
INTRAMUSCULAR | Status: DC | PRN
  Administered 2016-10-12: 10 mL via INTRADERMAL

## 2016-10-12 MED ORDER — LISINOPRIL 5 MG PO TABS
5.0000 mg | ORAL_TABLET | Freq: Every day | ORAL | Status: DC
  Administered 2016-10-13 – 2016-10-16 (×4): 5 mg via ORAL
  Filled 2016-10-12 (×4): qty 1

## 2016-10-12 MED ORDER — ONDANSETRON HCL 4 MG/2ML IJ SOLN
4.0000 mg | Freq: Four times a day (QID) | INTRAMUSCULAR | Status: DC | PRN
  Administered 2016-10-12: 4 mg via INTRAVENOUS
  Filled 2016-10-12: qty 2

## 2016-10-12 MED ORDER — HYDROMORPHONE HCL 1 MG/ML IJ SOLN
INTRAMUSCULAR | Status: AC
  Filled 2016-10-12: qty 0.5

## 2016-10-12 MED ORDER — DULOXETINE HCL 60 MG PO CPEP
60.0000 mg | ORAL_CAPSULE | Freq: Every day | ORAL | Status: DC
  Administered 2016-10-13 – 2016-10-16 (×4): 60 mg via ORAL
  Filled 2016-10-12 (×4): qty 1

## 2016-10-12 MED ORDER — METOPROLOL TARTRATE 12.5 MG HALF TABLET
12.5000 mg | ORAL_TABLET | Freq: Two times a day (BID) | ORAL | Status: DC
  Administered 2016-10-12 – 2016-10-16 (×8): 12.5 mg via ORAL
  Filled 2016-10-12 (×8): qty 1

## 2016-10-12 MED ORDER — SODIUM CHLORIDE 0.9 % WEIGHT BASED INFUSION: 1.0000 mL/kg/h | INTRAVENOUS | Status: AC

## 2016-10-12 MED ORDER — GABAPENTIN 300 MG PO CAPS
300.0000 mg | ORAL_CAPSULE | Freq: Three times a day (TID) | ORAL | Status: DC
  Administered 2016-10-12 – 2016-10-16 (×12): 300 mg via ORAL
  Filled 2016-10-12 (×12): qty 1

## 2016-10-12 MED ORDER — POTASSIUM CHLORIDE CRYS ER 20 MEQ PO TBCR
20.0000 meq | EXTENDED_RELEASE_TABLET | Freq: Every day | ORAL | Status: DC
  Administered 2016-10-12 – 2016-10-16 (×5): 20 meq via ORAL
  Filled 2016-10-12 (×6): qty 1

## 2016-10-12 MED ORDER — SODIUM CHLORIDE 0.9% FLUSH
3.0000 mL | Freq: Two times a day (BID) | INTRAVENOUS | Status: DC
  Administered 2016-10-12 – 2016-10-15 (×6): 3 mL via INTRAVENOUS

## 2016-10-12 MED ORDER — OXYBUTYNIN CHLORIDE 5 MG PO TABS
5.0000 mg | ORAL_TABLET | Freq: Two times a day (BID) | ORAL | Status: DC
  Administered 2016-10-12 – 2016-10-16 (×8): 5 mg via ORAL
  Filled 2016-10-12 (×8): qty 1

## 2016-10-12 MED ORDER — HEPARIN SODIUM (PORCINE) 5000 UNIT/ML IJ SOLN
5000.0000 [IU] | Freq: Three times a day (TID) | INTRAMUSCULAR | Status: DC
  Administered 2016-10-13 – 2016-10-16 (×10): 5000 [IU] via SUBCUTANEOUS
  Filled 2016-10-12 (×11): qty 1

## 2016-10-12 MED ORDER — ASPIRIN EC 81 MG PO TBEC
81.0000 mg | DELAYED_RELEASE_TABLET | Freq: Every day | ORAL | Status: DC
  Administered 2016-10-12 – 2016-10-16 (×5): 81 mg via ORAL
  Filled 2016-10-12 (×5): qty 1

## 2016-10-12 MED ORDER — LIDOCAINE HCL 2 % IJ SOLN
INTRAMUSCULAR | Status: AC
  Filled 2016-10-12: qty 10

## 2016-10-12 MED ORDER — SODIUM CHLORIDE 0.9 % IV SOLN: 250.0000 mL | INTRAVENOUS | Status: DC | PRN

## 2016-10-12 MED ORDER — ALBUTEROL SULFATE (2.5 MG/3ML) 0.083% IN NEBU: 3.0000 mL | INHALATION_SOLUTION | Freq: Four times a day (QID) | RESPIRATORY_TRACT | Status: DC | PRN

## 2016-10-12 MED ORDER — CYCLOBENZAPRINE HCL 10 MG PO TABS
5.0000 mg | ORAL_TABLET | Freq: Four times a day (QID) | ORAL | Status: DC | PRN
  Administered 2016-10-14 – 2016-10-15 (×2): 5 mg via ORAL
  Filled 2016-10-12 (×2): qty 1

## 2016-10-12 MED ORDER — CLOPIDOGREL BISULFATE 300 MG PO TABS
ORAL_TABLET | ORAL | Status: AC
  Filled 2016-10-12: qty 1

## 2016-10-12 MED ORDER — HYDROMORPHONE HCL 1 MG/ML IJ SOLN
0.5000 mg | INTRAMUSCULAR | Status: DC | PRN
  Administered 2016-10-12: 0.5 mg via INTRAVENOUS
  Administered 2016-10-13: 1 mg via INTRAVENOUS
  Filled 2016-10-12: qty 1

## 2016-10-12 MED ORDER — HEPARIN SODIUM (PORCINE) 1000 UNIT/ML IJ SOLN
INTRAMUSCULAR | Status: AC
  Filled 2016-10-12: qty 1

## 2016-10-12 MED ORDER — HEPARIN (PORCINE) IN NACL 2-0.9 UNIT/ML-% IJ SOLN
INTRAMUSCULAR | Status: AC
  Filled 2016-10-12: qty 500

## 2016-10-12 MED ORDER — MIDAZOLAM HCL 2 MG/2ML IJ SOLN
INTRAMUSCULAR | Status: DC | PRN
  Administered 2016-10-12: 0.5 mg via INTRAVENOUS
  Administered 2016-10-12: 1 mg via INTRAVENOUS

## 2016-10-12 MED ORDER — OXYCODONE HCL 5 MG PO TABS
5.0000 mg | ORAL_TABLET | ORAL | Status: DC | PRN
  Administered 2016-10-13: 10 mg via ORAL
  Administered 2016-10-14: 5 mg via ORAL
  Administered 2016-10-14 – 2016-10-16 (×3): 10 mg via ORAL
  Filled 2016-10-12: qty 1
  Filled 2016-10-12 (×4): qty 2

## 2016-10-12 MED ORDER — CLOPIDOGREL BISULFATE 75 MG PO TABS
300.0000 mg | ORAL_TABLET | Freq: Once | ORAL | Status: AC
  Administered 2016-10-12: 300 mg via ORAL

## 2016-10-12 MED ORDER — INSULIN ASPART 100 UNIT/ML ~~LOC~~ SOLN
4.0000 [IU] | Freq: Three times a day (TID) | SUBCUTANEOUS | Status: DC
  Administered 2016-10-13 – 2016-10-14 (×4): 4 [IU] via SUBCUTANEOUS

## 2016-10-12 MED ORDER — LABETALOL HCL 5 MG/ML IV SOLN: 10.0000 mg | INTRAVENOUS | Status: DC | PRN

## 2016-10-12 MED ORDER — HEPARIN SODIUM (PORCINE) 1000 UNIT/ML IJ SOLN
INTRAMUSCULAR | Status: DC | PRN
  Administered 2016-10-12: 6000 [IU] via INTRAVENOUS

## 2016-10-12 MED ORDER — PANTOPRAZOLE SODIUM 20 MG PO TBEC
20.0000 mg | DELAYED_RELEASE_TABLET | Freq: Every day | ORAL | Status: DC
  Administered 2016-10-12 – 2016-10-16 (×5): 20 mg via ORAL
  Filled 2016-10-12 (×5): qty 1

## 2016-10-12 MED ORDER — SODIUM CHLORIDE 0.9% FLUSH
3.0000 mL | INTRAVENOUS | Status: DC | PRN
  Administered 2016-10-14: 3 mL via INTRAVENOUS
  Filled 2016-10-12: qty 3

## 2016-10-12 MED ORDER — ACETAMINOPHEN 325 MG PO TABS
650.0000 mg | ORAL_TABLET | ORAL | Status: DC | PRN
  Administered 2016-10-13: 650 mg via ORAL
  Filled 2016-10-12: qty 2

## 2016-10-12 MED ORDER — HYDRALAZINE HCL 20 MG/ML IJ SOLN: 5.0000 mg | INTRAMUSCULAR | Status: DC | PRN

## 2016-10-12 MED ORDER — FENTANYL CITRATE (PF) 100 MCG/2ML IJ SOLN
INTRAMUSCULAR | Status: DC | PRN
  Administered 2016-10-12 (×2): 50 ug via INTRAVENOUS

## 2016-10-12 MED ORDER — MIDAZOLAM HCL 2 MG/2ML IJ SOLN
INTRAMUSCULAR | Status: AC
  Filled 2016-10-12: qty 2

## 2016-10-12 MED ORDER — HYDROCODONE-ACETAMINOPHEN 5-325 MG PO TABS
1.0000 | ORAL_TABLET | Freq: Four times a day (QID) | ORAL | Status: DC | PRN
  Administered 2016-10-13: 1 via ORAL
  Filled 2016-10-12: qty 1

## 2016-10-12 MED ORDER — SODIUM CHLORIDE 0.9 % IV SOLN
INTRAVENOUS | Status: DC
  Administered 2016-10-12: 11:00:00 via INTRAVENOUS

## 2016-10-12 MED ORDER — PREGABALIN 100 MG PO CAPS: 100.0000 mg | ORAL_CAPSULE | Freq: Two times a day (BID) | ORAL | Status: DC

## 2016-10-12 MED ORDER — INSULIN ASPART 100 UNIT/ML ~~LOC~~ SOLN
0.0000 [IU] | Freq: Three times a day (TID) | SUBCUTANEOUS | Status: DC
  Administered 2016-10-13: 3 [IU] via SUBCUTANEOUS
  Administered 2016-10-13 – 2016-10-16 (×4): 2 [IU] via SUBCUTANEOUS

## 2016-10-12 SURGICAL SUPPLY — 26 items
BALLN MUSTANG 6X60X75 (BALLOONS) ×3
BALLN STERLING OTW 5X150X150 (BALLOONS) ×3
BALLOON MUSTANG 6X60X75 (BALLOONS) IMPLANT
BALLOON STERLING OTW 5X150X150 (BALLOONS) IMPLANT
CATH ANGIO 5F BER2 65CM (CATHETERS) ×1 IMPLANT
CATH CROSS OVER TEMPO 5F (CATHETERS) ×1 IMPLANT
CATH OMNI FLUSH 5F 65CM (CATHETERS) ×1 IMPLANT
CATH QUICKCROSS SUPP .035X90CM (MICROCATHETER) ×1 IMPLANT
COVER PRB 48X5XTLSCP FOLD TPE (BAG) IMPLANT
COVER PROBE 5X48 (BAG) ×3
DEVICE TORQUE .025-.038 (MISCELLANEOUS) ×1 IMPLANT
GUIDEWIRE ANGLED .035X260CM (WIRE) ×1 IMPLANT
KIT ENCORE 26 ADVANTAGE (KITS) ×1 IMPLANT
KIT MICROINTRODUCER STIFF 5F (SHEATH) ×1 IMPLANT
KIT PV (KITS) ×3 IMPLANT
SHEATH FLEX ANSEL ANG 6F 45CM (SHEATH) ×1 IMPLANT
SHEATH PINNACLE 5F 10CM (SHEATH) ×1 IMPLANT
STENT INNOVA 7X60X130 (Permanent Stent) ×1 IMPLANT
STENT VIABAHN 5X250X120 (Permanent Stent) ×1 IMPLANT
STENT VIABAHN 6X150X120 (Permanent Stent) ×1 IMPLANT
SYR MEDRAD MARK V 150ML (SYRINGE) ×3 IMPLANT
TRANSDUCER W/STOPCOCK (MISCELLANEOUS) ×3 IMPLANT
TRAY PV CATH (CUSTOM PROCEDURE TRAY) ×3 IMPLANT
WIRE BENTSON .035X145CM (WIRE) ×1 IMPLANT
WIRE G V18X300CM (WIRE) ×1 IMPLANT
WIRE ROSEN-J .035X260CM (WIRE) ×1 IMPLANT

## 2016-10-12 NOTE — Op Note (Signed)
Patient name: Andrea [name] MRN: [id number] DOB: Jun 05, 1950 Sex: female  10/12/2016 Pre-operative Diagnosis: critical left lower extremity ischemia Post-operative diagnosis:  Same Surgeon:  Apolinar Junes C. Randie Heinz, MD Procedure Performed: 1.  US guided cannulation of right common femoral artery 2.  Aortogram with left lower extremity angiogram 3.  Stent of left sfa and popliteal arteries with 5 x viabahn distally and 6 x 150 viabahn proximally 4.  Stent of left external iliac artery with 7 x 60mm innova 5.  Moderate sedation with fentanyl and versed for 84 minutes  Indications:  66 year old female with a history of right femoral-popliteal artery bypass with graft and a subsequent left femoral-popliteal artery bypass with graft. Unfortunately the left sided bypass became infected was removed with patch angioplasties of both common femoral and below-knee popliteal arteries. She has progressive gangrene of her left foot is now indicated for possible endovascular salvage.  Findings: There is a greater than 60% stenosis on the left external iliac artery. Following stenting there is 0% residual. The left SFA is occluded reconstitutes an above-knee popliteal artery. Following stenting of this there is 0% residual stenosis and runoff is dominant via the anterior tibial artery filling at least the proximal aspect of the foot.   Procedure:  The patient was identified in the holding area and taken to room 8.  The patient was then placed supine on the table and prepped and draped in the usual sterile fashion.  A time out was called.  Ultrasound was used to evaluate the right common femoral artery.  It was patent .  A digital ultrasound image was acquired.  A micropuncture needle was used to access the right common femoral artery under ultrasound guidance.  An 018 wire was advanced without resistance and a micropuncture sheath was placed.  The 018 wire was removed and a benson wire was placed.  The  micropuncture sheath was exchanged for a 5 french sheath.  An omniflush catheter was advanced over the wire to the level of L-1.  An abdominal angiogram was obtained.  I then attempted to cross the bifurcation with the Omni Flush catheter but was unable. I then used a Crosser catheter and Glidewire to get across the external iliac artery stenoses and performed angiogram of the left lower extremity demonstrating occluded SFA dominant runoff was via the anterior tibial. Following this we then placed a Rosen wire and a long 6 French sheath into the common femoral artery on the left the patient was heparinized. Glidewire Barrett catheter was used to engage the proximal SFA and a quick cross catheter and Glidewire were used to cross the long segment occlusion. We were able to get back in the above-knee popliteal artery intraluminally and confirmed this with angiogram. We then predilated the entirety of the SFA and above-knee popliteal artery with 5 mm balloon and then stented with a 5 mm x 250 mm viabahn stent. This was extended proximally to the takeoff of the SFA with a 6 mm viabahn 150 mm in length. Following this we post-dilated with a 5 mm balloon and angiogram demonstrated brisk runoff through the stents with 0% residual stenosis and dominant runoff via the anterior tibial artery. We then retracted our sheath into the common iliac artery on the left and performed angled views of the external iliac artery demonstrating a greater than 60% stenosis. This was then primarily stented with a 7 x 60mm innova stent and postdilated with 6 mm balloon. Completion demonstrated 0% residual stenosis and brisk  runoff through the iliacs to the level of the stent in the SFA. Satisfied with this the sheath was retracted to the right external iliac artery and wire was removed. Patient tolerated procedure well without immediate complication.   Contrast: 65cc  Brandon C. Randie Heinz, MD [facility]  [place] Office: [phone] Pager: [phone]

## 2016-10-12 NOTE — H&P (Signed)
   History and Physical Update  The patient was interviewed and re-examined.  The patient's previous History and Physical has been reviewed and is unchanged from recent office visit. Plan for aortogram and possible retrograde intervention on the left and will admit for surgery tomorrow.    Kristi Norment C. Randie Heinz, MD [facility] [place] Office: [phone] Pager: [phone]   10/12/2016, 11:09 AM

## 2016-10-13 ENCOUNTER — Encounter (HOSPITAL_COMMUNITY): Payer: Self-pay | Admitting: Vascular Surgery

## 2016-10-13 ENCOUNTER — Encounter (HOSPITAL_COMMUNITY)
Admission: AD | Disposition: A | Discharge status: Skilled Nursing Facility | Payer: Self-pay | Source: Ambulatory Visit | Attending: Vascular Surgery

## 2016-10-13 ENCOUNTER — Inpatient Hospital Stay (HOSPITAL_COMMUNITY): Payer: BLUE CROSS/BLUE SHIELD | Admitting: Certified Registered Nurse Anesthetist

## 2016-10-13 ENCOUNTER — Inpatient Hospital Stay (HOSPITAL_COMMUNITY)
Admission: RE | Admit: 2016-10-13 | Payer: BLUE CROSS/BLUE SHIELD | Source: Ambulatory Visit | Admitting: Vascular Surgery

## 2016-10-13 DIAGNOSIS — L609 Nail disorder, unspecified: Secondary | ICD-10-CM

## 2016-10-13 DIAGNOSIS — I70262 Atherosclerosis of native arteries of extremities with gangrene, left leg: Secondary | ICD-10-CM | POA: Diagnosis not present

## 2016-10-13 DIAGNOSIS — E1152 Type 2 diabetes mellitus with diabetic peripheral angiopathy with gangrene: Secondary | ICD-10-CM | POA: Diagnosis not present

## 2016-10-13 HISTORY — PX: AMPUTATION TOE: SHX6595

## 2016-10-13 LAB — SURGICAL PCR SCREEN
MRSA, PCR: NEGATIVE
Staphylococcus aureus: POSITIVE — AB

## 2016-10-13 LAB — GLUCOSE, CAPILLARY
GLUCOSE-CAPILLARY: 187 mg/dL — AB (ref 65–99)
GLUCOSE-CAPILLARY: 139 mg/dL — AB (ref 65–99)
Glucose-Capillary: 146 mg/dL — ABNORMAL HIGH (ref 65–99)
Glucose-Capillary: 149 mg/dL — ABNORMAL HIGH (ref 65–99)
Glucose-Capillary: 80 mg/dL (ref 65–99)
Glucose-Capillary: 147 mg/dL — ABNORMAL HIGH (ref 65–99)

## 2016-10-13 LAB — CBC
HEMATOCRIT: 26.7 % — AB (ref 36.0–46.0)
Hemoglobin: 8.5 g/dL — ABNORMAL LOW (ref 12.0–15.0)
MCH: 28.3 pg (ref 26.0–34.0)
MCHC: 31.8 g/dL (ref 30.0–36.0)
MCV: 89 fL (ref 78.0–100.0)
PLATELETS: 247 10*3/uL (ref 150–400)
RBC: 3 MIL/uL — ABNORMAL LOW (ref 3.87–5.11)
RDW: 14.8 % (ref 11.5–15.5)
WBC: 9.6 10*3/uL (ref 4.0–10.5)

## 2016-10-13 LAB — BASIC METABOLIC PANEL
Anion gap: 9 (ref 5–15)
BUN: 30 mg/dL — AB (ref 6–20)
CALCIUM: 9.1 mg/dL (ref 8.9–10.3)
CO2: 26 mmol/L (ref 22–32)
CREATININE: 1.16 mg/dL — AB (ref 0.44–1.00)
Chloride: 104 mmol/L (ref 101–111)
GFR calc Af Amer: 57 mL/min — ABNORMAL LOW (ref >60)
GFR, EST NON AFRICAN AMERICAN: 49 mL/min — AB (ref >60)
Glucose, Bld: 171 mg/dL — ABNORMAL HIGH (ref 65–99)
POTASSIUM: 4.8 mmol/L (ref 3.5–5.1)
Sodium: 139 mmol/L (ref 135–145)

## 2016-10-13 SURGERY — AMPUTATION, TOE
Anesthesia: General | Site: Foot | Laterality: Left

## 2016-10-13 MED ORDER — ONDANSETRON HCL 4 MG/2ML IJ SOLN: 4.0000 mg | Freq: Once | INTRAMUSCULAR | Status: DC | PRN

## 2016-10-13 MED ORDER — PHENYLEPHRINE 40 MCG/ML (10ML) SYRINGE FOR IV PUSH (FOR BLOOD PRESSURE SUPPORT)
PREFILLED_SYRINGE | INTRAVENOUS | Status: AC
  Filled 2016-10-13: qty 10

## 2016-10-13 MED ORDER — MEPERIDINE HCL 25 MG/ML IJ SOLN: 6.2500 mg | INTRAMUSCULAR | Status: DC | PRN

## 2016-10-13 MED ORDER — GLYCOPYRROLATE 0.2 MG/ML IV SOSY
PREFILLED_SYRINGE | INTRAVENOUS | Status: DC | PRN
  Administered 2016-10-13: .1 mg via INTRAVENOUS

## 2016-10-13 MED ORDER — 0.9 % SODIUM CHLORIDE (POUR BTL) OPTIME
TOPICAL | Status: DC | PRN
  Administered 2016-10-13: 1000 mL

## 2016-10-13 MED ORDER — CHLORHEXIDINE GLUCONATE CLOTH 2 % EX PADS: 6.0000 | MEDICATED_PAD | Freq: Every day | CUTANEOUS | Status: DC

## 2016-10-13 MED ORDER — FENTANYL CITRATE (PF) 250 MCG/5ML IJ SOLN
INTRAMUSCULAR | Status: AC
  Filled 2016-10-13: qty 5

## 2016-10-13 MED ORDER — CEFAZOLIN SODIUM-DEXTROSE 2-4 GM/100ML-% IV SOLN
INTRAVENOUS | Status: AC
  Filled 2016-10-13: qty 100

## 2016-10-13 MED ORDER — MIDAZOLAM HCL 2 MG/2ML IJ SOLN
INTRAMUSCULAR | Status: AC
  Filled 2016-10-13: qty 2

## 2016-10-13 MED ORDER — PROPOFOL 10 MG/ML IV BOLUS
INTRAVENOUS | Status: AC
  Filled 2016-10-13: qty 20

## 2016-10-13 MED ORDER — ONDANSETRON HCL 4 MG/2ML IJ SOLN
INTRAMUSCULAR | Status: DC | PRN
  Administered 2016-10-13: 4 mg via INTRAVENOUS

## 2016-10-13 MED ORDER — LACTATED RINGERS IV SOLN
INTRAVENOUS | Status: DC
  Administered 2016-10-13 (×2): via INTRAVENOUS

## 2016-10-13 MED ORDER — EPHEDRINE 5 MG/ML INJ
INTRAVENOUS | Status: AC
  Filled 2016-10-13: qty 10

## 2016-10-13 MED ORDER — FENTANYL CITRATE (PF) 100 MCG/2ML IJ SOLN: 25.0000 ug | INTRAMUSCULAR | Status: DC | PRN

## 2016-10-13 MED ORDER — PHENYLEPHRINE 40 MCG/ML (10ML) SYRINGE FOR IV PUSH (FOR BLOOD PRESSURE SUPPORT)
PREFILLED_SYRINGE | INTRAVENOUS | Status: DC | PRN
Start: 2016-10-13 — End: 2016-10-13
  Administered 2016-10-13: 40 ug via INTRAVENOUS
  Administered 2016-10-13 (×3): 80 ug via INTRAVENOUS
  Administered 2016-10-13: 40 ug via INTRAVENOUS
  Administered 2016-10-13: 80 ug via INTRAVENOUS

## 2016-10-13 MED ORDER — EPHEDRINE SULFATE-NACL 50-0.9 MG/10ML-% IV SOSY
PREFILLED_SYRINGE | INTRAVENOUS | Status: DC | PRN
  Administered 2016-10-13: 10 mg via INTRAVENOUS
  Administered 2016-10-13: 5 mg via INTRAVENOUS
  Administered 2016-10-13: 10 mg via INTRAVENOUS

## 2016-10-13 MED ORDER — PROPOFOL 10 MG/ML IV BOLUS
INTRAVENOUS | Status: DC | PRN
  Administered 2016-10-13: 100 mg via INTRAVENOUS

## 2016-10-13 MED ORDER — LIDOCAINE HCL (CARDIAC) 20 MG/ML IV SOLN
INTRAVENOUS | Status: DC | PRN
  Administered 2016-10-13: 100 mg via INTRAVENOUS

## 2016-10-13 MED ORDER — FENTANYL CITRATE (PF) 100 MCG/2ML IJ SOLN
INTRAMUSCULAR | Status: DC | PRN
  Administered 2016-10-13: 50 ug via INTRAVENOUS

## 2016-10-13 MED ORDER — CEFAZOLIN SODIUM-DEXTROSE 2-4 GM/100ML-% IV SOLN
2.0000 g | Freq: Once | INTRAVENOUS | Status: AC
  Administered 2016-10-13: 2 g via INTRAVENOUS

## 2016-10-13 MED ORDER — MUPIROCIN 2 % EX OINT
1.0000 "application " | TOPICAL_OINTMENT | Freq: Two times a day (BID) | CUTANEOUS | Status: DC
  Administered 2016-10-13 – 2016-10-15 (×6): 1 via NASAL
  Filled 2016-10-13 (×2): qty 22

## 2016-10-13 MED ORDER — ONDANSETRON HCL 4 MG/2ML IJ SOLN
INTRAMUSCULAR | Status: AC
  Filled 2016-10-13: qty 2

## 2016-10-13 SURGICAL SUPPLY — 43 items
BANDAGE ACE 4X5 VEL STRL LF (GAUZE/BANDAGES/DRESSINGS) ×3 IMPLANT
BANDAGE ACE 6X5 VEL STRL LF (GAUZE/BANDAGES/DRESSINGS) ×3 IMPLANT
BANDAGE ELASTIC 4 VELCRO ST LF (GAUZE/BANDAGES/DRESSINGS) ×2 IMPLANT
BLADE SAW GIGLI 510 (BLADE) ×3 IMPLANT
BNDG COHESIVE 6X5 TAN STRL LF (GAUZE/BANDAGES/DRESSINGS) ×3 IMPLANT
BNDG GAUZE ELAST 4 BULKY (GAUZE/BANDAGES/DRESSINGS) ×3 IMPLANT
CANISTER SUCT 3000ML PPV (MISCELLANEOUS) ×3 IMPLANT
CLIP VESOCCLUDE MED 6/CT (CLIP) ×3 IMPLANT
COVER SURGICAL LIGHT HANDLE (MISCELLANEOUS) ×3 IMPLANT
DRAIN CHANNEL 19F RND (DRAIN) IMPLANT
DRAPE HALF SHEET 40X57 (DRAPES) ×3 IMPLANT
DRAPE ORTHO SPLIT 77X108 STRL (DRAPES) ×6
DRAPE SURG ORHT 6 SPLT 77X108 (DRAPES) ×4 IMPLANT
DRSG ADAPTIC 3X8 NADH LF (GAUZE/BANDAGES/DRESSINGS) ×3 IMPLANT
DRSG EMULSION OIL 3X3 NADH (GAUZE/BANDAGES/DRESSINGS) ×2 IMPLANT
ELECT CAUTERY BLADE 6.4 (BLADE) ×3 IMPLANT
ELECT REM PT RETURN 9FT ADLT (ELECTROSURGICAL) ×3
ELECTRODE REM PT RTRN 9FT ADLT (ELECTROSURGICAL) ×2 IMPLANT
EVACUATOR SILICONE 100CC (DRAIN) IMPLANT
GAUZE SPONGE 4X4 12PLY STRL (GAUZE/BANDAGES/DRESSINGS) ×3 IMPLANT
GAUZE SPONGE 4X4 12PLY STRL LF (GAUZE/BANDAGES/DRESSINGS) ×2 IMPLANT
GLOVE BIO SURGEON STRL SZ7.5 (GLOVE) ×3 IMPLANT
GOWN STRL REUS W/ TWL LRG LVL3 (GOWN DISPOSABLE) ×4 IMPLANT
GOWN STRL REUS W/ TWL XL LVL3 (GOWN DISPOSABLE) ×2 IMPLANT
GOWN STRL REUS W/TWL LRG LVL3 (GOWN DISPOSABLE) ×6
GOWN STRL REUS W/TWL XL LVL3 (GOWN DISPOSABLE) ×3
KIT BASIN OR (CUSTOM PROCEDURE TRAY) ×3 IMPLANT
KIT ROOM TURNOVER OR (KITS) ×3 IMPLANT
NS IRRIG 1000ML POUR BTL (IV SOLUTION) ×3 IMPLANT
PACK GENERAL/GYN (CUSTOM PROCEDURE TRAY) ×3 IMPLANT
PAD ARMBOARD 7.5X6 YLW CONV (MISCELLANEOUS) ×6 IMPLANT
STAPLER VISISTAT 35W (STAPLE) ×3 IMPLANT
STOCKINETTE IMPERVIOUS LG (DRAPES) ×3 IMPLANT
SUT ETHILON 3 0 PS 1 (SUTURE) ×4 IMPLANT
SUT SILK 0 TIES 10X30 (SUTURE) ×3 IMPLANT
SUT SILK 2 0 (SUTURE) ×3
SUT SILK 2-0 18XBRD TIE 12 (SUTURE) ×2 IMPLANT
SUT SILK 3 0 (SUTURE)
SUT SILK 3-0 18XBRD TIE 12 (SUTURE) IMPLANT
SUT VIC AB 2-0 CT1 18 (SUTURE) ×6 IMPLANT
TOWEL GREEN STERILE (TOWEL DISPOSABLE) ×3 IMPLANT
UNDERPAD 30X30 (UNDERPADS AND DIAPERS) ×3 IMPLANT
WATER STERILE IRR 1000ML POUR (IV SOLUTION) ×3 IMPLANT

## 2016-10-13 NOTE — Progress Notes (Signed)
  Progress Note    10/13/2016 8:07 AM 1 Day Post-Op  Subjective:  Feeling fine this a.m.  Vitals:   10/13/16 0450 10/13/16 0758  BP:  (!) 114/28  Pulse:  60  Resp: 17   Temp:    SpO2:      Physical Exam: aaox3 Non labored respirations Abdomen is soft Right groin without hematoma Left leg dressing cdi Stable gangrenous changes to left foot  CBC    Component Value Date/Time   WBC 9.6 10/13/2016 0330   RBC 3.00 (L) 10/13/2016 0330   HGB 8.5 (L) 10/13/2016 0330   HGB 10.8 (L) 07/01/2016 1445   HCT 26.7 (L) 10/13/2016 0330   HCT 32.9 (L) 07/01/2016 1445   PLT 247 10/13/2016 0330   PLT 340 07/01/2016 1445   MCV 89.0 10/13/2016 0330   MCV 88 07/01/2016 1445   MCH 28.3 10/13/2016 0330   MCHC 31.8 10/13/2016 0330   RDW 14.8 10/13/2016 0330   RDW 15.5 (H) 07/01/2016 1445   LYMPHSABS 3.0 07/01/2016 1445   MONOABS 0.3 05/25/2016 0349   EOSABS 0.4 07/01/2016 1445   BASOSABS 0.0 07/01/2016 1445    BMET    Component Value Date/Time   NA 139 10/13/2016 0330   NA 140 04/14/2016 1431   K 4.8 10/13/2016 0330   CL 104 10/13/2016 0330   CO2 26 10/13/2016 0330   GLUCOSE 171 (H) 10/13/2016 0330   BUN 30 (H) 10/13/2016 0330   BUN 19 04/14/2016 1431   CREATININE 1.16 (H) 10/13/2016 0330   CALCIUM 9.1 10/13/2016 0330   GFRNONAA 49 (L) 10/13/2016 0330   GFRAA 57 (L) 10/13/2016 0330    INR    Component Value Date/Time   INR 1.09 09/10/2016 1450     Intake/Output Summary (Last 24 hours) at 10/13/16 0807 Last data filed at 10/13/16 0542  Gross per 24 hour  Intake                0 ml  Output             1400 ml  Net            -1400 ml     Assessment:  66 y.o. female is s/p stenting of left sfa-pop for critical limb ischemia  Plan: OR today for amputation of affected left toes, possible tma.   Arliss Hepburn C. Randie Heinz, MD [facility] [place] Office: [phone] Pager: [phone]  10/13/2016 8:07 AM

## 2016-10-13 NOTE — Transfer of Care (Signed)
Immediate Anesthesia Transfer of Care Note  Patient: Andrea [name]  Procedure(s) Performed: AMPUTATION LEFT GREAT TOE AND SECOND TOE (Left Foot)  Patient Location: PACU  Anesthesia Type:General  Level of Consciousness: awake and alert   Airway & Oxygen Therapy: Patient Spontanous Breathing and Patient connected to nasal cannula oxygen  Post-op Assessment: Report given to RN, Post -op Vital signs reviewed and stable and Patient moving all extremities X 4  Post vital signs: Reviewed and stable  Last Vitals:  Vitals:   10/13/16 0802 10/13/16 1030  BP: (!) 114/28 96/80  Pulse: 61 86  Resp:  13  Temp: 36.8 C (!) 36.3 C  SpO2: 100% 94%    Last Pain:  Vitals:   10/13/16 0802  TempSrc: Oral  PainSc: 0-No pain      Patients Stated Pain Goal: 3 (10/13/16 0432)  Complications: No apparent anesthesia complications

## 2016-10-13 NOTE — Anesthesia Preprocedure Evaluation (Addendum)
Anesthesia Evaluation  Patient identified by MRN, date of birth, ID band Patient awake    Reviewed: Allergy & Precautions, NPO status , Patient's Chart, lab work & pertinent test results, reviewed documented beta blocker date and time   History of Anesthesia Complications (+) PONV and history of anesthetic complications  Airway Mallampati: I  TM Distance: >3 FB Neck ROM: Full    Dental  (+) Upper Dentures, Partial Lower, Dental Advisory Given, Poor Dentition   Pulmonary shortness of breath and with exertion, asthma , former smoker,    Pulmonary exam normal breath sounds clear to auscultation       Cardiovascular hypertension, Pt. on medications and Pt. on home beta blockers (-) angina+ CAD, + Cardiac Stents, + CABG and + Peripheral Vascular Disease  Normal cardiovascular exam Rhythm:Regular Rate:Normal     Neuro/Psych PSYCHIATRIC DISORDERS Diabetic neuropathy  Neuromuscular disease negative neurological ROS     GI/Hepatic Neg liver ROS, GERD  Medicated and Controlled,  Endo/Other  diabetes, Well Controlled, Type 2, Insulin Dependent  Renal/GU negative Renal ROS Bladder dysfunction  Overactive bladder    Musculoskeletal  (+) Fibromyalgia -Open wound left groin- complicated   Abdominal   Peds  Hematology  (+) anemia ,   Anesthesia Other Findings Fibromyalgia  Reproductive/Obstetrics                             Anesthesia Physical  Anesthesia Plan  ASA: III  Anesthesia Plan: General   Post-op Pain Management:    Induction: Intravenous  PONV Risk Score and Plan: 4 or greater and Ondansetron, Dexamethasone, Midazolam, Propofol infusion and Treatment may vary due to age or medical condition  Airway Management Planned: Oral ETT and LMA  Additional Equipment: None  Intra-op Plan:   Post-operative Plan: Extubation in OR  Informed Consent: I have reviewed the patients History and  Physical, chart, labs and discussed the procedure including the risks, benefits and alternatives for the proposed anesthesia with the patient or authorized representative who has indicated his/her understanding and acceptance.   Dental advisory given  Plan Discussed with: CRNA, Anesthesiologist and Surgeon  Anesthesia Plan Comments:        Anesthesia Quick Evaluation

## 2016-10-13 NOTE — H&P (Signed)
Patient unable to answer all questions at this time

## 2016-10-13 NOTE — Op Note (Signed)
    Patient name: Andrea [name] MRN: [id number] DOB: May 12, 1950 Sex: female  10/13/2016 Pre-operative Diagnosis: gangrene of left 1st and 2nd toes Post-operative diagnosis:  Same Surgeon:  Luanna Salk. Randie Heinz, MD Assistants: OR nurse Procedure Performed: 1.  Amputation of left 1st and 2nd toes 2.  Trimming of bilateral toenails  Indications:  66 year old female history of relatively bypass and left lower extremity SFA and popliteal artery stenting.  She has gangrene of her left first and second toes and Xylocaine for amputation.  She also has lengthy toenails bilaterally and needs them trimmed.  She also has staples in place from her recent removal of her graft on the left lower extremity and requires staple removal.  Findings: There is adequate bleeding from her toe amputation sites which were primarily closed.  All toenails were trimmed and smoothed the edges.   Procedure:  The patient was identified in the holding area and taken to the operating room where she was placed supine on the operative table and general anesthesia was induced.  She was given antibiotics and sterile prep and drape of the left foot in the usual fashion.  Timeout was called.  I began with a tennis racquet type incision around the base of both of her first and second toes on the left.  There was healthy bleeding.  Electrocautery was used to remove the subcutaneous tissue down to the level of the joint spaces which the toes were then removed from.  I did remove sesamoid bone as well.  The joint capsules were removed and the bone edges smoothed with rasp.  The wound was then irrigated and closed with interrupted 3-0 nylon suture.  I then turned my attention to encourage her toenails on both sides which were removed with a bone cutter and smoothed with rasp.  I also removed the remaining staples in her left lower extremity incisions intact her bloody incision with wet to dry dressing.  She was then allowed to wake anesthesia  having tolerated the procedure well without immediate complication.  All counts were correct at completion.     Jasiah Elsen C. Randie Heinz, MD [facility] [place] Office: [phone] Pager: [phone]

## 2016-10-13 NOTE — Anesthesia Procedure Notes (Signed)
Procedure Name: LMA Insertion Date/Time: 10/13/2016 9:37 AM Performed by: Little Ishikawa L Pre-anesthesia Checklist: Patient identified, Emergency Drugs available, Suction available and Patient being monitored Patient Re-evaluated:Patient Re-evaluated prior to induction Oxygen Delivery Method: Circle System Utilized Preoxygenation: Pre-oxygenation with 100% oxygen Induction Type: IV induction Ventilation: Mask ventilation without difficulty LMA: LMA inserted LMA Size: 4.0 Number of attempts: 1 Airway Equipment and Method: Bite block Placement Confirmation: positive ETCO2 and breath sounds checked- equal and bilateral Tube secured with: Tape Dental Injury: Teeth and Oropharynx as per pre-operative assessment

## 2016-10-13 NOTE — Anesthesia Preprocedure Evaluation (Signed)
Anesthesia Evaluation  Patient identified by MRN, date of birth, ID band Patient awake    Reviewed: Allergy & Precautions, NPO status , Patient's Chart, lab work & pertinent test results, reviewed documented beta blocker date and time   History of Anesthesia Complications (+) PONV and history of anesthetic complications  Airway Mallampati: I  TM Distance: >3 FB Neck ROM: Full    Dental  (+) Upper Dentures, Partial Lower, Dental Advisory Given, Poor Dentition   Pulmonary shortness of breath and with exertion, asthma , former smoker,    Pulmonary exam normal breath sounds clear to auscultation       Cardiovascular hypertension, Pt. on medications and Pt. on home beta blockers (-) angina+ CAD, + Cardiac Stents, + CABG and + Peripheral Vascular Disease  Normal cardiovascular exam Rhythm:Regular Rate:Normal     Neuro/Psych PSYCHIATRIC DISORDERS Diabetic neuropathy  Neuromuscular disease negative neurological ROS     GI/Hepatic Neg liver ROS, GERD  Medicated and Controlled,  Endo/Other  diabetes, Well Controlled, Type 2, Insulin Dependent  Renal/GU negative Renal ROS Bladder dysfunction  Overactive bladder    Musculoskeletal  (+) Fibromyalgia -Open wound left groin- complicated   Abdominal   Peds  Hematology  (+) anemia ,   Anesthesia Other Findings Fibromyalgia  Reproductive/Obstetrics                             Anesthesia Physical  Anesthesia Plan  ASA: III  Anesthesia Plan: General   Post-op Pain Management:    Induction: Intravenous  PONV Risk Score and Plan: 4 or greater and Ondansetron, Dexamethasone, Midazolam, Propofol infusion and Treatment may vary due to age or medical condition  Airway Management Planned: Oral ETT  Additional Equipment: None  Intra-op Plan:   Post-operative Plan: Extubation in OR  Informed Consent: I have reviewed the patients History and  Physical, chart, labs and discussed the procedure including the risks, benefits and alternatives for the proposed anesthesia with the patient or authorized representative who has indicated his/her understanding and acceptance.   Dental advisory given  Plan Discussed with: CRNA, Anesthesiologist and Surgeon  Anesthesia Plan Comments:         Anesthesia Quick Evaluation  

## 2016-10-13 NOTE — Anesthesia Postprocedure Evaluation (Signed)
Anesthesia Post Note  Patient: Lonie Peak Kallio  Procedure(s) Performed: AMPUTATION LEFT GREAT TOE AND SECOND TOE (Left Foot)     Patient location during evaluation: PACU Anesthesia Type: General Level of consciousness: awake and alert Pain management: pain level controlled Vital Signs Assessment: post-procedure vital signs reviewed and stable Respiratory status: spontaneous breathing, nonlabored ventilation, respiratory function stable and patient connected to nasal cannula oxygen Cardiovascular status: blood pressure returned to baseline and stable Postop Assessment: no apparent nausea or vomiting Anesthetic complications: no    Last Vitals:  Vitals:   10/13/16 1030 10/13/16 1045  BP: 96/80 (!) 128/54  Pulse: 86 88  Resp: 13 13  Temp: (!) 36.3 C   SpO2: 94% 96%    Last Pain:  Vitals:   10/13/16 1045  TempSrc:   PainSc: Asleep                 Yuliza Cara

## 2016-10-13 NOTE — Anesthesia Postprocedure Evaluation (Signed)
Anesthesia Post Note  Patient: Andrea [name]  Procedure(s) Performed: AMPUTATION LEFT GREAT TOE AND SECOND TOE (Left Foot)     Patient location during evaluation: PACU Anesthesia Type: General Level of consciousness: awake and alert Pain management: pain level controlled Vital Signs Assessment: post-procedure vital signs reviewed and stable Respiratory status: spontaneous breathing, nonlabored ventilation, respiratory function stable and patient connected to nasal cannula oxygen Cardiovascular status: blood pressure returned to baseline and stable Postop Assessment: no apparent nausea or vomiting Anesthetic complications: no    Last Vitals:  Vitals:   10/13/16 0802 10/13/16 1030  BP: (!) 114/28 96/80  Pulse: 61 86  Resp:  13  Temp: 36.8 C (!) 36.3 C  SpO2: 100% 94%    Last Pain:  Vitals:   10/13/16 1030  TempSrc:   PainSc: (P) 0-No pain    LLE Motor Response: (P) Purposeful movement (10/13/16 1030) LLE Sensation: (P) Full sensation (10/13/16 1030) RLE Motor Response: (P) Purposeful movement (10/13/16 1030) RLE Sensation: (P) Full sensation (10/13/16 1030)      [name]

## 2016-10-13 NOTE — Progress Notes (Signed)
Orthopedic Tech Progress Note Patient Details:  Andrea [name] 1950/04/24 [id number]  Ortho Devices Type of Ortho Device: Postop shoe/boot Ortho Device/Splint Location: LLE Ortho Device/Splint Interventions: Ordered, Application   Jennye Moccasin 10/13/2016, 4:35 PM

## 2016-10-13 NOTE — Progress Notes (Signed)
Left unit via bed to OR, accompanied by family  Member. Metoprolol given as ordered, Heart rate 60 bpm; consent signed on chart, received 2 cHG baths, brushed teeth.; applied bactroban to both nostrils. No signs of distress noted during transport.

## 2016-10-14 ENCOUNTER — Encounter (HOSPITAL_COMMUNITY): Payer: Self-pay | Admitting: Vascular Surgery

## 2016-10-14 DIAGNOSIS — E1152 Type 2 diabetes mellitus with diabetic peripheral angiopathy with gangrene: Secondary | ICD-10-CM | POA: Diagnosis not present

## 2016-10-14 LAB — GLUCOSE, CAPILLARY
Glucose-Capillary: 129 mg/dL — ABNORMAL HIGH (ref 65–99)
Glucose-Capillary: 127 mg/dL — ABNORMAL HIGH (ref 65–99)

## 2016-10-14 LAB — MRSA PCR SCREENING: MRSA by PCR: NEGATIVE

## 2016-10-14 NOTE — Progress Notes (Signed)
  Progress Note    10/14/2016 1:54 PM 1 Day Post-Op  Subjective:  Left foot pain overnight  Vitals:   10/14/16 0800 10/14/16 1200  BP: (!) 117/48 (!) 143/42  Pulse: 74 70  Resp: 18 18  Temp: 99.3 F (37.4 C) (!) 101.5 F (38.6 C)  SpO2: 97% 94%    Physical Exam: aaox3  Abdomen is soft Right groin without hematoma Left groin and thigh incisions are in tact Left below knee incision packed Left foot incision site cdi Strong peroneal signals bilaterally  CBC    Component Value Date/Time   WBC 9.6 10/13/2016 0330   RBC 3.00 (L) 10/13/2016 0330   HGB 8.5 (L) 10/13/2016 0330   HGB 10.8 (L) 07/01/2016 1445   HCT 26.7 (L) 10/13/2016 0330   HCT 32.9 (L) 07/01/2016 1445   PLT 247 10/13/2016 0330   PLT 340 07/01/2016 1445   MCV 89.0 10/13/2016 0330   MCV 88 07/01/2016 1445   MCH 28.3 10/13/2016 0330   MCHC 31.8 10/13/2016 0330   RDW 14.8 10/13/2016 0330   RDW 15.5 (H) 07/01/2016 1445   LYMPHSABS 3.0 07/01/2016 1445   MONOABS 0.3 05/25/2016 0349   EOSABS 0.4 07/01/2016 1445   BASOSABS 0.0 07/01/2016 1445    BMET    Component Value Date/Time   NA 139 10/13/2016 0330   NA 140 04/14/2016 1431   K 4.8 10/13/2016 0330   CL 104 10/13/2016 0330   CO2 26 10/13/2016 0330   GLUCOSE 171 (H) 10/13/2016 0330   BUN 30 (H) 10/13/2016 0330   BUN 19 04/14/2016 1431   CREATININE 1.16 (H) 10/13/2016 0330   CALCIUM 9.1 10/13/2016 0330   GFRNONAA 49 (L) 10/13/2016 0330   GFRAA 57 (L) 10/13/2016 0330    INR    Component Value Date/Time   INR 1.09 09/10/2016 1450     Intake/Output Summary (Last 24 hours) at 10/14/16 1354 Last data filed at 10/14/16 1309  Gross per 24 hour  Intake              480 ml  Output             1450 ml  Net             -970 ml     Assessment:  66 y.o. female is s/p stenting of left sfa and popliteal arteries and left 1st and 2nd toe amputations  Plan: Ok for ambulation with post op shoe Daily dressing changes left bk pop incision Continue  plavix and aspirin   Andrea Waymire C. Randie Heinz, MD [facility] Office: [phone] Pager: [phone]  10/14/2016 1:54 PM

## 2016-10-14 NOTE — Progress Notes (Signed)
PCR NEGATIVE FOR mrsa

## 2016-10-14 NOTE — Care Management Note (Signed)
Case Management Note Donn Pierini RN, BSN Unit 4E-Case Manager [phone]  Patient Details  Name: Andrea [name] MRN: [id number] Date of Birth: 1950-05-14  Subjective/Objective:   Pt admitted with gangrene of 1 and 2 toes- s/p amputation of toes                 Action/Plan: PTA pt was at [facility] [employer]- CSW to follow for return to SNF- was also notified by Eber Jones with [facility] that they have been following [facility] needs- CM will follow for d/c needs.   Expected Discharge Date:                  Expected Discharge Plan:  Skilled Nursing Facility  In-House Referral:  Clinical Social Work  Discharge planning Services  CM Consult  Post Acute Care Choice:    Choice offered to:     DME Arranged:    DME Agency:     HH Arranged:    HH Agency:     Status of Service:  In process, will continue to follow  If discussed at Long Length of Stay Meetings, dates discussed:    Discharge Disposition:   Additional Comments:  Darrold Span, RN 10/14/2016, 10:45 AM

## 2016-10-15 DIAGNOSIS — E1152 Type 2 diabetes mellitus with diabetic peripheral angiopathy with gangrene: Secondary | ICD-10-CM | POA: Diagnosis not present

## 2016-10-15 LAB — GLUCOSE, CAPILLARY
Glucose-Capillary: 127 mg/dL — ABNORMAL HIGH (ref 65–99)
Glucose-Capillary: 150 mg/dL — ABNORMAL HIGH (ref 65–99)

## 2016-10-15 NOTE — Progress Notes (Signed)
Patient adamantly refuses to have her temperature taken or her blood sugar checked.  We will try again later.

## 2016-10-15 NOTE — Evaluation (Signed)
Physical Therapy Evaluation Patient Details Name: Andrea [name] MRN: [id number] DOB: Mar 11, 1950 Today's Date: 10/15/2016   History of Present Illness  Pt is a 66 y/o female who is s/p L femoral popliteal bypass graft and 1st and 2nd toe amputation on L foot. PMH includes Lt fem-below knee popliteal artery BPG and Lt fem endarterectomy 07/20/16, CABG, fibromyalgia, urinary incontinence, right fem-below knee popliteal artery BPG with prior infection and debridement.   Clinical Impression  Pt s/p procedure above with deficits below. Unable to obtain PLOF information as pt very lethargic this session. Per RN, pt from SNF. Upon eval, pt very limited by lethargy and also presenting with weakness, decreased balance, post op pain, and decreased cognition. Required mod to max A +2 for mobility this tasks this session and continuous cues required for sequencing. Noted drainage at incision site on L foot upon completion, so notified RN and RN to address. Recommending SNF at d/c to increase independence and safety with functional mobility. Will continue to follow acutely to maximize functional mobility independence and safety.     Follow Up Recommendations SNF;Supervision/Assistance - 24 hour    Equipment Recommendations  None recommended by PT    Recommendations for Other Services       Precautions / Restrictions Precautions Precautions: Fall Required Braces or Orthoses: Other Brace/Splint Other Brace/Splint: post op shoe L foot  Restrictions Weight Bearing Restrictions: No      Mobility  Bed Mobility Overal bed mobility: Needs Assistance Bed Mobility: Supine to Sit     Supine to sit: Max assist;+2 for physical assistance     General bed mobility comments: Max +2 secondary to increased lethargy. Required assist for trunk elevation and to scoot hips to EOB. Verbal and manual cues for sequencing throughout secondary to increased lethargy.   Transfers Overall transfer level: Needs  assistance Equipment used: Rolling walker (2 wheeled);2 person hand held assist Transfers: Sit to/from [employer] Sit to Stand: Mod assist;+2 physical assistance Stand pivot transfers: Mod assist;+2 physical assistance       General transfer comment: Attempted standing with RW, however, pt with improper use secondary to lethargy. Used 2 person HHA on second attempt and required mod A +2 for lift assist and steadying throughout transfer. Cues for LE sequencing throughout secondary to increased lethargy.   Ambulation/Gait             General Gait Details: NT secondary to lethargy   Stairs            Wheelchair Mobility    Modified Rankin (Stroke Patients Only)       Balance Overall balance assessment: Needs assistance Sitting-balance support: Bilateral upper extremity supported;Feet supported Sitting balance-Leahy Scale: Poor Sitting balance - Comments: Reliant on mod to min guard for balance at EOB secondary to lethargy.    Standing balance support: Bilateral upper extremity supported;During functional activity Standing balance-Leahy Scale: Poor Standing balance comment: Reliant on BUE support and external assist during transfer.                              Pertinent Vitals/Pain Pain Assessment: Faces Faces Pain Scale: Hurts even more Pain Location: LLE and L foot at incision site  Pain Descriptors / Indicators: Sore;Aching Pain Intervention(s): Limited activity within patient's tolerance;Monitored during session;Repositioned    Home Living Family/patient expects to be discharged to:: Skilled nursing facility  Additional Comments: Per RN, pt from SNF     Prior Function Level of Independence: Needs assistance   Gait / Transfers Assistance Needed: Unsure of PLOF as pt very lethargic this session. Per RN, pt from SNF and will need to determine PLOF once pt oriented.   ADL's / Homemaking Assistance Needed: From  SNF, pt unable to report at this time.         Hand Dominance        Extremity/Trunk Assessment   Upper Extremity Assessment Upper Extremity Assessment: Generalized weakness    Lower Extremity Assessment Lower Extremity Assessment: LLE deficits/detail;Generalized weakness;Difficult to assess due to impaired cognition LLE Deficits / Details: s/p surgeries above. Some pain reported upon standing on LLE.     Cervical / Trunk Assessment Cervical / Trunk Assessment: Kyphotic  Communication   Communication: HOH  Cognition Arousal/Alertness: Lethargic Behavior During Therapy: WFL for tasks assessed/performed Overall Cognitive Status: Impaired/Different from baseline Area of Impairment: Safety/judgement;Problem solving;Orientation;Memory;Attention;Following commands;Awareness                 Orientation Level: Disoriented to;Person;Place;Time;Situation Current Attention Level: Focused Memory: Decreased short-term memory Following Commands: Follows one step commands inconsistently Safety/Judgement: Decreased awareness of safety;Decreased awareness of deficits Awareness: Intellectual Problem Solving: Slow processing;Decreased initiation;Difficulty sequencing;Requires tactile cues;Requires verbal cues General Comments: When asked her name, pt states "puddintang" and unable to tell me her birthday. Very lethargic and requiring multiple safety cues throughout session.       General Comments General comments (skin integrity, edema, etc.): Drainage noted at incision site; RN notified at end of session.     Exercises     Assessment/Plan    PT Assessment Patient needs continued PT services  PT Problem List Decreased strength;Decreased mobility;Decreased activity tolerance;Decreased balance;Decreased knowledge of use of DME;Decreased skin integrity;Decreased cognition;Decreased safety awareness;Decreased knowledge of precautions       PT Treatment Interventions Gait  training;Therapeutic exercise;Patient/family education;DME instruction;Therapeutic activities;Cognitive remediation;Balance training;Functional mobility training;Neuromuscular re-education    PT Goals (Current goals can be found in the Care Plan section)  Acute Rehab PT Goals Patient Stated Goal: unable to state  PT Goal Formulation: Patient unable to participate in goal setting Time For Goal Achievement: 10/29/16 Potential to Achieve Goals: Fair    Frequency Min 2X/week   Barriers to discharge        Co-evaluation               AM-PAC PT "6 Clicks" Daily Activity  Outcome Measure Difficulty turning over in bed (including adjusting bedclothes, sheets and blankets)?: Unable Difficulty moving from lying on back to sitting on the side of the bed? : Unable Difficulty sitting down on and standing up from a chair with arms (e.g., wheelchair, bedside commode, etc,.)?: Unable Help needed moving to and from a bed to chair (including a wheelchair)?: A Lot Help needed walking in hospital room?: A Lot Help needed climbing 3-5 steps with a railing? : Total 6 Click Score: 8    End of Session Equipment Utilized During Treatment: Gait belt Activity Tolerance: Patient limited by lethargy Patient left: in chair;with call bell/phone within reach;with chair alarm set Nurse Communication: Mobility status;Other (comment) (drainage at incision site ) PT Visit Diagnosis: Difficulty in walking, not elsewhere classified (R26.2);Pain;Unsteadiness on feet (R26.81);Muscle weakness (generalized) (M62.81) Pain - Right/Left: Left Pain - part of body: Leg;Ankle and joints of foot    Time: 1527-1550 PT Time Calculation (min) (ACUTE ONLY): 23 min   Charges:   PT Evaluation $PT Eval  Moderate Complexity: 1 Mod PT Treatments $Therapeutic Activity: 8-22 mins   PT G Codes:        Gladys Damme, PT, DPT  Acute Rehabilitation Services  Pager: [phone]   Lehman Prom 10/15/2016, 6:02  PM

## 2016-10-15 NOTE — Progress Notes (Signed)
  Progress Note    10/15/2016 10:53 AM 2 Days Post-Op  Subjective:  No new complaints  Vitals:   10/15/16 0000 10/15/16 0437  BP: (!) 154/44 (!) 154/57  Pulse: 71 89  Resp: (!) 22 (!) 21  Temp:  99.7 F (37.6 C)  SpO2: 93% 94%    Physical Exam: Awake and alert Abdomen is soft Right groin without hematoma Left groin and thigh incisions are in tact Left below knee incision packed, underlying muscle is healthy Left foot incision site cdi Strong peroneal signals bilaterally, dp on the left  CBC    Component Value Date/Time   WBC 9.6 10/13/2016 0330   RBC 3.00 (L) 10/13/2016 0330   HGB 8.5 (L) 10/13/2016 0330   HGB 10.8 (L) 07/01/2016 1445   HCT 26.7 (L) 10/13/2016 0330   HCT 32.9 (L) 07/01/2016 1445   PLT 247 10/13/2016 0330   PLT 340 07/01/2016 1445   MCV 89.0 10/13/2016 0330   MCV 88 07/01/2016 1445   MCH 28.3 10/13/2016 0330   MCHC 31.8 10/13/2016 0330   RDW 14.8 10/13/2016 0330   RDW 15.5 (H) 07/01/2016 1445   LYMPHSABS 3.0 07/01/2016 1445   MONOABS 0.3 05/25/2016 0349   EOSABS 0.4 07/01/2016 1445   BASOSABS 0.0 07/01/2016 1445    BMET    Component Value Date/Time   NA 139 10/13/2016 0330   NA 140 04/14/2016 1431   K 4.8 10/13/2016 0330   CL 104 10/13/2016 0330   CO2 26 10/13/2016 0330   GLUCOSE 171 (H) 10/13/2016 0330   BUN 30 (H) 10/13/2016 0330   BUN 19 04/14/2016 1431   CREATININE 1.16 (H) 10/13/2016 0330   CALCIUM 9.1 10/13/2016 0330   GFRNONAA 49 (L) 10/13/2016 0330   GFRAA 57 (L) 10/13/2016 0330    INR    Component Value Date/Time   INR 1.09 09/10/2016 1450     Intake/Output Summary (Last 24 hours) at 10/15/16 1053 Last data filed at 10/15/16 0600  Gross per 24 hour  Intake              100 ml  Output             1400 ml  Net            -1300 ml     Assessment:  66 y.o. female is s/p stenting of left sfa and popliteal arteries and left 1st and 2nd toe amputations  Plan: Ok for ambulation with post op shoe-PT ordered Daily  dressing changes left bk pop incision Continue plavix and aspirin dispo will be snf when bed available    Tyashia Morrisette C. Randie Heinz, MD [facility] Office: [phone] Pager: [phone]  10/15/2016 10:53 AM

## 2016-10-16 ENCOUNTER — Telehealth: Payer: Self-pay | Admitting: Vascular Surgery

## 2016-10-16 DIAGNOSIS — E1152 Type 2 diabetes mellitus with diabetic peripheral angiopathy with gangrene: Secondary | ICD-10-CM | POA: Diagnosis not present

## 2016-10-16 LAB — GLUCOSE, CAPILLARY: Glucose-Capillary: 141 mg/dL — ABNORMAL HIGH (ref 65–99)

## 2016-10-16 MED ORDER — HYDROCODONE-ACETAMINOPHEN 5-325 MG PO TABS: 1.0000 | ORAL_TABLET | Freq: Four times a day (QID) | ORAL | 0 refills | Status: DC | PRN

## 2016-10-16 MED ORDER — CLOPIDOGREL BISULFATE 75 MG PO TABS: 75.0000 mg | ORAL_TABLET | Freq: Every day | ORAL | 3 refills | Status: DC

## 2016-10-16 NOTE — Telephone Encounter (Signed)
-----   Message from Sharee Pimple, RN sent at 10/16/2016  9:35 AM EDT ----- Regarding: 3 weeks suture removal    ----- Message ----- From: Dara Lords, PA-C Sent: 10/16/2016   8:56 AM To: Vvs Charge Pool  S/p sfa stenting and amputation toes left foot.  F/u with Dr. Randie Heinz in 3 weeks for wound check and suture removal.  Thanks

## 2016-10-16 NOTE — Progress Notes (Signed)
Clinical Social Worker facilitated patient discharge including contacting patient family and facility to confirm patient discharge plans.  Clinical information faxed to facility and family agreeable with plan.  CSW arranged ambulance transport via [facility] to [facility] .  RN to call [phone] (pt will go in rm# [id number]) for report prior to discharge.  Clinical Social Worker will sign off for now as social work intervention is no longer needed. Please consult [place] again if new need arises.  Marrianne Mood, MSW, [employer] [phone]

## 2016-10-16 NOTE — Clinical Social Work Note (Signed)
Clinical Social Work Assessment  Patient Details  Name: Andrea [name] MRN: [id number] Date of Birth: 07-12-1950  Date of referral:  10/16/16               Reason for consult:  Discharge Planning, Facility Placement                Permission sought to share information with:  Family Supports Permission granted to share information::  Yes, Verbal Permission Granted  Name::     Josem Kaufmann   Agency::  snf  Relationship::  sister  Contact Information:  [phone]  Housing/Transportation Living arrangements for the past 2 months:  Single Family Home Source of Information:  Other (Comment Required) (sister) Patient Interpreter Needed:  None Criminal Activity/Legal Involvement Pertinent to Current Situation/Hospitalization:  No - Comment as needed Significant Relationships:  Adult Children, Other Family Members Lives with:  Self Do you feel safe going back to the place where you live?  Yes Need for family participation in patient care:  No (Coment)  Care giving concerns:  No family at bedside   Social Worker assessment / plan: CSW unable to assess patient as she is only orient to self. CSW contacted patients emergency contact Josem Kaufmann (pt sister). Sister stated she is out of town and that she was not "impressed by [facility] Sister stated she is not that familiar with other facilities therefor would rather send patient back to [facility] since facility knows patient. CSW spoke to facility about patient yesterday to make them aware their patient is here. 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) PT Recommendations:  Skilled Nursing Facility Information / Referral to community resources:  Skilled Nursing Facility  Patient/Family's Response to care:  Sister verbalized appreciation for CSW role and support of patient during  hospitalizatin  Patient/Family's Understanding of and Emotional Response to Diagnosis, Current Treatment, and Prognosis:  Sister aware of patient recent hospitalization and limitations  Emotional Assessment Appearance:  Appears stated age Attitude/Demeanor/Rapport:  Unable to Assess Affect (typically observed):  Unable to Assess Orientation:  Oriented to Self Alcohol / Substance use:  Not Applicable Psych involvement (Current and /or in the community):  No (Comment)  Discharge Needs  Concerns to be addressed:  No discharge needs identified Readmission within the last 30 days:  No Current discharge risk:  None Barriers to Discharge:  No Barriers Identified   Althea Charon, LCSW 10/16/2016, 1:24 PM

## 2016-10-16 NOTE — Discharge Instructions (Signed)
° °  [facility]  Discharge Instructions  Lower Extremity Angiogram; Angioplasty/Stenting/Toe amputations  Please refer to the following instructions for your post-procedure care. Your surgeon or physician assistant will discuss any changes with you.  Activity  Avoid lifting more than 8 pounds (1 gallons of milk) for 72 hours (3 days) after your procedure. You may walk as much as you can tolerate. It's OK to drive after 72 hours.  Heel weight bearing only.    Bathing/Showering  You may shower the day after your procedure. If you have a bandage, you may remove it at 24- 48 hours. Clean your incision site with mild soap and water. Pat the area dry with a clean towel.  Incisions:  Shower daily.  Wet to dry saline dressing changes to left calf twice daily.  Dry dressing to left foot and change daily.   Diet  Resume your pre-procedure diet. There are no special food restrictions following this procedure. All patients with peripheral vascular disease should follow a low fat/low cholesterol diet. In order to heal from your surgery, it is CRITICAL to get adequate nutrition. Your body requires vitamins, minerals, and protein. Vegetables are the best source of vitamins and minerals. Vegetables also provide the perfect balance of protein. Processed food has little nutritional value, so try to avoid this.  Resume insulin as glucose allows and SSI.  Medications  Resume taking all of your medications unless your doctor tells you not to. If your incision is causing pain, you may take over-the-counter pain relievers such as acetaminophen (Tylenol)  Follow Up  Follow up will be arranged at the time of your procedure. You may have an office visit scheduled or may be scheduled for surgery. Ask your surgeon if you have any questions.  Please call us immediately for any of the following conditions: Severe or worsening pain your legs or feet at rest or with  walking. Increased pain, redness, drainage at your groin puncture site. Fever of 101 degrees or higher. If you have any mild or slow bleeding from your puncture site: lie down, apply firm constant pressure over the area with a piece of gauze or a clean wash cloth for 30 minutes- no peeking!, call 911 right away if you are still bleeding after 30 minutes, or if the bleeding is heavy and unmanageable.  Reduce your risk factors of vascular disease:  Stop smoking. If you would like help call [facility] at 1-800-QUIT-NOW (2508532566) or [place] at [phone] [phone]. Manage your cholesterol Maintain a desired weight Control your diabetes Keep your blood pressure down  If you have any questions, please call the office at [phone]

## 2016-10-16 NOTE — Progress Notes (Signed)
Pt refused to allow Nurse Or Tech to check AM blood sugar.

## 2016-10-16 NOTE — Discharge Summary (Signed)
Discharge Summary    RAGAN DUHON 12/02/1950 66 y.o. female  [id number]  Admission Date: 10/12/2016  Discharge Date: 10/16/16  Physician: Juventino Slovak*  Admission Diagnosis: PAD (peripheral artery disease) (HCC) [I73.9]   HPI:   This is a 66 y.o. female history of relatively bypass and left lower extremity SFA and popliteal artery stenting.  She has gangrene of her left first and second toes and Xylocaine for amputation.  She also has lengthy toenails bilaterally and needs them trimmed.  She also has staples in place from her recent removal of her graft on the left lower extremity and requires staple removal.  Hospital Course:  The patient was admitted to the hospital and taken to the operating room on 10/12/2016 & underwent: 1.  US guided cannulation of right common femoral artery 2.  Aortogram with left lower extremity angiogram 3.  Stent of left sfa and popliteal arteries with 5 x viabahn distally and 6 x 150 viabahn proximally 4.  Stent of left external iliac artery with 7 x 60mm innova 5.  Moderate sedation with fentanyl and versed for 84 minutes  Intraoperative findings as follows:  Findings: There is a greater than 60% stenosis on the left external iliac artery. Following stenting there is 0% residual. The left SFA is occluded reconstitutes an above-knee popliteal artery. Following stenting of this there is 0% residual stenosis and runoff is dominant via the anterior tibial artery filling at least the proximal aspect of the foot.  On 10/13/2016 and underwent: 1.  Amputation of left 1st and 2nd toes 2.  Trimming of bilateral toenails    Intraoperative findings as follows:  Findings: There is adequate bleeding from her toe amputation sites which were primarily closed.  All toenails were trimmed and smoothed the edges.  The pt tolerated the procedure well and was transported to the PACU in good condition.   Post operatively, she was ambulating with a  post op shoe-heel weight bearing only.  She was started on Plavix for her stent placement.  Daily dressing changes with wet to dry saline dressing changes bid for left calf wound.    The remainder of the hospital course consisted of increasing mobilization and increasing intake of solids without difficulty.  CBC    Component Value Date/Time   WBC 9.6 10/13/2016 0330   RBC 3.00 (L) 10/13/2016 0330   HGB 8.5 (L) 10/13/2016 0330   HGB 10.8 (L) 07/01/2016 1445   HCT 26.7 (L) 10/13/2016 0330   HCT 32.9 (L) 07/01/2016 1445   PLT 247 10/13/2016 0330   PLT 340 07/01/2016 1445   MCV 89.0 10/13/2016 0330   MCV 88 07/01/2016 1445   MCH 28.3 10/13/2016 0330   MCHC 31.8 10/13/2016 0330   RDW 14.8 10/13/2016 0330   RDW 15.5 (H) 07/01/2016 1445   LYMPHSABS 3.0 07/01/2016 1445   MONOABS 0.3 05/25/2016 0349   EOSABS 0.4 07/01/2016 1445   BASOSABS 0.0 07/01/2016 1445    BMET    Component Value Date/Time   NA 139 10/13/2016 0330   NA 140 04/14/2016 1431   K 4.8 10/13/2016 0330   CL 104 10/13/2016 0330   CO2 26 10/13/2016 0330   GLUCOSE 171 (H) 10/13/2016 0330   BUN 30 (H) 10/13/2016 0330   BUN 19 04/14/2016 1431   CREATININE 1.16 (H) 10/13/2016 0330   CALCIUM 9.1 10/13/2016 0330   GFRNONAA 49 (L) 10/13/2016 0330   GFRAA 57 (L) 10/13/2016 0330  Discharge Diagnosis:  PAD (peripheral artery disease) (HCC) [I73.9]  Secondary Diagnosis: Patient Active Problem List   Diagnosis Date Noted  . 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 Atherosclerosis of native arteries of extremities with gangrene, left leg (HCC) 07/20/2016  . GERD (gastroesophageal reflux disease) 07/01/2016  . Adjustment reaction with anxiety and depression 07/01/2016  . Wound infection 06/12/2016  . CAD (coronary artery disease) of artery bypass graft 04/15/2016  . Coronary artery disease involving coronary bypass graft of native heart with angina pectoris (HCC)   . Coronary artery disease  involving native coronary artery of native heart without angina pectoris 04/07/2016  . PAD (peripheral artery disease) (HCC) 03/30/2016  . Diabetic foot ulcers (HCC) 03/30/2016  . Hyperlipidemia 03/30/2016  . Diabetic neuropathy (HCC) 02/24/2016  . Tobacco abuse 02/24/2016  . Overactive bladder 01/14/2016  . Diabetes mellitus without complication (HCC) 10/18/2006  . Essential hypertension 10/18/2006  . LOW BACK PAIN, CHRONIC 10/18/2006   Past Medical History:  Diagnosis Date  . Asthma   . Coronary artery disease   . Diabetes mellitus without complication (HCC)   . Dyspnea   . Fibromyalgia   . GERD (gastroesophageal reflux disease)   . High cholesterol   . Hypertension   . PONV (postoperative nausea and vomiting)   . PVD (peripheral vascular disease) (HCC)   . Urinary frequency   . Urinary incontinence      Allergies as of 10/16/2016      Reactions   Naproxen Itching   Tramadol Hcl Itching      Medication List    TAKE these medications   albuterol 108 (90 Base) MCG/ACT inhaler Commonly known as:  PROVENTIL HFA;VENTOLIN HFA Inhale 2 puffs into the lungs every 6 (six) hours as needed for wheezing or shortness of breath.   aspirin EC 81 MG tablet Take 1 tablet (81 mg total) by mouth daily.   atorvastatin 80 MG tablet Commonly known as:  LIPITOR Take 1 tablet (80 mg total) by mouth daily.   clopidogrel 75 MG tablet Commonly known as:  PLAVIX Take 1 tablet (75 mg total) by mouth daily with breakfast.   cyclobenzaprine 5 MG tablet Commonly known as:  FLEXERIL Take 5 mg by mouth every 6 (six) hours as needed (pain/muscle spams.).   DULoxetine 60 MG capsule Commonly known as:  CYMBALTA Take 1 capsule (60 mg total) by mouth daily.   Fluticasone-Salmeterol 100-50 MCG/DOSE Aepb Commonly known as:  ADVAIR Inhale 1 puff into the lungs 2 (two) times daily.   furosemide 20 MG tablet Commonly known as:  LASIX Take 1 tablet (20 mg total) by mouth daily.   gabapentin  300 MG capsule Commonly known as:  NEURONTIN Take 300 mg by mouth 3 (three) times daily.   hydrochlorothiazide 25 MG tablet Commonly known as:  HYDRODIURIL Take 25 mg by mouth daily.   HYDROcodone-acetaminophen 5-325 MG tablet Commonly known as:  NORCO/VICODIN Take 1 tablet by mouth every 6 (six) hours as needed for moderate pain.   ibuprofen 200 MG tablet Commonly known as:  ADVIL,MOTRIN Take 400 mg by mouth every 6 (six) hours as needed for mild pain.   Insulin Glargine 100 UNIT/ML Solostar Pen Commonly known as:  LANTUS SOLOSTAR Inject 30 Units into the skin at bedtime.   lisinopril 5 MG tablet Commonly known as:  PRINIVIL,ZESTRIL Take 1 tablet (5 mg total) by mouth daily.   metoprolol tartrate 25 MG tablet Commonly known as:  LOPRESSOR Take 0.5 tablets (12.5 mg total)  by mouth 2 (two) times daily.   oxybutynin 5 MG tablet Commonly known as:  DITROPAN TAKE 1 TABLET BY MOUTH 2 TIMES DAILY.   pantoprazole 20 MG tablet Commonly known as:  PROTONIX Take 1 tablet (20 mg total) by mouth daily.   potassium chloride SA 20 MEQ tablet Commonly known as:  K-DUR,KLOR-CON Take 20 mEq by mouth daily.   pregabalin 100 MG capsule Commonly known as:  LYRICA Take 1 capsule (100 mg total) by mouth 2 (two) times daily.       Instructions:  [facility]  Discharge Instructions  Lower Extremity Angiogram; Angioplasty/Stenting/Toe amputations  Please refer to the following instructions for your post-procedure care. Your surgeon or physician assistant will discuss any changes with you.  Activity  Avoid lifting more than 8 pounds (1 gallons of milk) for 72 hours (3 days) after your procedure. You may walk as much as you can tolerate. It's OK to drive after 72 hours.  Heel weight bearing only.    Bathing/Showering  You may shower the day after your procedure. If you have a bandage, you may remove it at 24- 48 hours. Clean your incision site with  mild soap and water. Pat the area dry with a clean towel.  Incisions:  Shower daily.  Wet to dry saline dressing changes to left calf twice daily.  Dry dressing to left foot and change daily.   Diet  Resume your pre-procedure diet. There are no special food restrictions following this procedure. All patients with peripheral vascular disease should follow a low fat/low cholesterol diet. In order to heal from your surgery, it is CRITICAL to get adequate nutrition. Your body requires vitamins, minerals, and protein. Vegetables are the best source of vitamins and minerals. Vegetables also provide the perfect balance of protein. Processed food has little nutritional value, so try to avoid this.  Resume insulin as glucose allows and SSI.  Medications  Resume taking all of your medications unless your doctor tells you not to. If your incision is causing pain, you may take over-the-counter pain relievers such as acetaminophen (Tylenol)  Follow Up  Follow up will be arranged at the time of your procedure. You may have an office visit scheduled or may be scheduled for surgery. Ask your surgeon if you have any questions.  Please call us immediately for any of the following conditions: .Severe or worsening pain your legs or feet at rest or with walking. .Increased pain, redness, drainage at your groin puncture site. .Fever of 101 degrees or higher. .If you have any mild or slow bleeding from your puncture site: lie down, apply firm constant pressure over the area with a piece of gauze or a clean wash cloth for 30 minutes- no peeking!, call 911 right away if you are still bleeding after 30 minutes, or if the bleeding is heavy and unmanageable.  Reduce your risk factors of vascular disease:  Stop smoking. If you would like help call [facility] at 1-800-QUIT-NOW ((863) 725-6110) or [place] at [phone]. Manage your cholesterol Maintain a desired weight Control your diabetes Keep your blood  pressure down  If you have any questions, please call the office at [phone]  Prescriptions given: Norco #20 No Refill  Disposition: SNF  Patient's condition: is Good  Follow up: 1. Dr. Randie Heinz in 3 weeks   Doreatha Massed, PA-C [facility] [phone] 10/16/2016  8:59 AM

## 2016-10-16 NOTE — Telephone Encounter (Signed)
Sched appt 11/06/16 at 10:30. Lm on cell#.

## 2016-10-16 NOTE — Progress Notes (Signed)
  Progress Note    10/16/2016 8:35 AM 3 Days Post-Op  Subjective:  No new complaints, appetite improving  Vitals:   10/15/16 2357 10/16/16 0356  BP: (!) 93/55   Pulse:    Resp:    Temp: 98.7 F (37.1 C) 99.1 F (37.3 C)  SpO2:      Physical Exam: More alert and conversive this a.m. Abdomen is soft Right groin without hematoma Left groin and thigh incisions are in tact Left below knee incision packed, underlying muscle is healthy Left foot incision site cdi Strong peroneal signals bilaterally, dp on the left  CBC    Component Value Date/Time   WBC 9.6 10/13/2016 0330   RBC 3.00 (L) 10/13/2016 0330   HGB 8.5 (L) 10/13/2016 0330   HGB 10.8 (L) 07/01/2016 1445   HCT 26.7 (L) 10/13/2016 0330   HCT 32.9 (L) 07/01/2016 1445   PLT 247 10/13/2016 0330   PLT 340 07/01/2016 1445   MCV 89.0 10/13/2016 0330   MCV 88 07/01/2016 1445   MCH 28.3 10/13/2016 0330   MCHC 31.8 10/13/2016 0330   RDW 14.8 10/13/2016 0330   RDW 15.5 (H) 07/01/2016 1445   LYMPHSABS 3.0 07/01/2016 1445   MONOABS 0.3 05/25/2016 0349   EOSABS 0.4 07/01/2016 1445   BASOSABS 0.0 07/01/2016 1445    BMET    Component Value Date/Time   NA 139 10/13/2016 0330   NA 140 04/14/2016 1431   K 4.8 10/13/2016 0330   CL 104 10/13/2016 0330   CO2 26 10/13/2016 0330   GLUCOSE 171 (H) 10/13/2016 0330   BUN 30 (H) 10/13/2016 0330   BUN 19 04/14/2016 1431   CREATININE 1.16 (H) 10/13/2016 0330   CALCIUM 9.1 10/13/2016 0330   GFRNONAA 49 (L) 10/13/2016 0330   GFRAA 57 (L) 10/13/2016 0330    INR    Component Value Date/Time   INR 1.09 09/10/2016 1450    No intake or output data in the 24 hours ending 10/16/16 0835   Assessment: 66 y.o.femaleis s/p stenting of left sfa and popliteal arteries and left 1st and 2nd toe amputations  Plan: PT following, will need snf Daily dressing changes left bk pop incision Continue plavix and aspirin    Brandon C. Randie Heinz, MD [facility]  [place] Office: [phone] Pager: [phone]  10/16/2016 8:35 AM

## 2016-10-16 NOTE — [place] FL2 (Signed)
[place] MEDICAID FL2 LEVEL OF CARE SCREENING TOOL     IDENTIFICATION  Patient Name: Andrea [name] Birthdate: 05/18/1950 Sex: female Admission Date (Current Location): 10/12/2016  [facility] and [place] Number:  Producer, television/film/video and Address:  [facility], [address], [place], [place] [address]      Provider Number: [id number]  Attending Physician Name and Address:  Juventino Slovak*  Relative Name and Phone Number:  Josem Kaufmann [phone]    Current Level of Care: Hospital Recommended Level of Care: Skilled Nursing Facility Prior Approval Number:    Date Approved/Denied:   PASRR Number: [id number]  Discharge Plan: SNF    Current Diagnoses: Patient Active Problem List   Diagnosis Date Noted  . 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 Atherosclerosis of native arteries of extremities with gangrene, left leg (HCC) 07/20/2016  . GERD (gastroesophageal reflux disease) 07/01/2016  . Adjustment reaction with anxiety and depression 07/01/2016  . Wound infection 06/12/2016  . CAD (coronary artery disease) of artery bypass graft 04/15/2016  . Coronary artery disease involving coronary bypass graft of native heart with angina pectoris (HCC)   . Coronary artery disease involving native coronary artery of native heart without angina pectoris 04/07/2016  . PAD (peripheral artery disease) (HCC) 03/30/2016  . Diabetic foot ulcers (HCC) 03/30/2016  . Hyperlipidemia 03/30/2016  . Diabetic neuropathy (HCC) 02/24/2016  . Tobacco abuse 02/24/2016  . Overactive bladder 01/14/2016  . Diabetes mellitus without complication (HCC) 10/18/2006  . Essential hypertension 10/18/2006  . LOW BACK PAIN, CHRONIC 10/18/2006    Orientation RESPIRATION BLADDER Height & Weight     Self  Normal Incontinent Weight: 123 lb 1.6 oz (55.8 kg) Height:   (157.5 cm)  BEHAVIORAL SYMPTOMS/MOOD NEUROLOGICAL BOWEL NUTRITION STATUS   Incontinent (incontinent at times) Diet (regular)  AMBULATORY STATUS COMMUNICATION OF NEEDS Skin   Extensive Assist Verbally Surgical wounds                       Personal Care Assistance Level of Assistance  Bathing, Feeding, Dressing Bathing Assistance: Maximum assistance Feeding assistance: Independent Dressing Assistance: Maximum assistance     Functional Limitations Info  Sight, Hearing, Speech Sight Info: Adequate Hearing Info: Adequate Speech Info: Impaired    SPECIAL CARE FACTORS FREQUENCY  PT (By licensed PT), OT (By licensed OT)     PT Frequency: 5x wk OT Frequency: 5x wk            Contractures Contractures Info: Not present    Additional Factors Info  Code Status, Allergies Code Status Info: Full Code Allergies Info: Naproxen, Tramadol HCL           Current Medications (10/16/2016):  This is the current hospital active medication list Current Facility-Administered Medications  Medication Dose Route Frequency Provider Last Rate Last Dose  . 0.9 %  sodium chloride infusion  250 mL Intravenous PRN Maeola Harman, MD      . acetaminophen (TYLENOL) tablet 650 mg  650 mg Oral Q4H PRN Maeola Harman, MD   650 mg at 10/13/16 2353  . albuterol (PROVENTIL) (2.5 MG/3ML) 0.083% nebulizer solution 3 mL  3 mL Inhalation Q6H PRN Maeola Harman, MD      . aspirin EC tablet 81 mg  81 mg Oral Daily Maeola Harman, MD   81 mg at 10/16/16 0934  . atorvastatin (LIPITOR) tablet 80 mg  80 mg Oral q1800 Maeola Harman,  MD   80 mg at 10/15/16 1721  . Chlorhexidine Gluconate Cloth 2 % PADS 6 each  6 each Topical Daily Maeola Harman, MD      . clopidogrel (PLAVIX) tablet 75 mg  75 mg Oral Q breakfast Maeola Harman, MD   75 mg at 10/16/16 0981  . cyclobenzaprine (FLEXERIL) tablet 5 mg  5 mg Oral Q6H PRN Maeola Harman, MD   5 mg at 10/15/16 1038  . DULoxetine (CYMBALTA) DR capsule 60 mg   60 mg Oral Daily Maeola Harman, MD   60 mg at 10/16/16 1914  . gabapentin (NEURONTIN) capsule 300 mg  300 mg Oral TID Maeola Harman, MD   300 mg at 10/16/16 0924  . heparin injection 5,000 Units  5,000 Units Subcutaneous Q8H Maeola Harman, MD   5,000 Units at 10/15/16 2158  . hydrALAZINE (APRESOLINE) injection 5 mg  5 mg Intravenous Q20 Min PRN Maeola Harman, MD      . HYDROcodone-acetaminophen (NORCO/VICODIN) 5-325 MG per tablet 1 tablet  1 tablet Oral Q6H PRN Maeola Harman, MD   1 tablet at 10/13/16 0430  . HYDROmorphone (DILAUDID) injection 0.5-1 mg  0.5-1 mg Intravenous Q2H PRN Maeola Harman, MD   1 mg at 10/13/16 1324  . insulin aspart (novoLOG) injection 0-15 Units  0-15 Units Subcutaneous TID WC Maeola Harman, MD   2 Units at 10/14/16 1705  . insulin aspart (novoLOG) injection 4 Units  4 Units Subcutaneous TID WC Maeola Harman, MD   4 Units at 10/14/16 1706  . labetalol (NORMODYNE,TRANDATE) injection 10 mg  10 mg Intravenous Q10 min PRN Maeola Harman, MD      . lactated ringers infusion   Intravenous Continuous Bethena Midget, MD 10 mL/hr at 10/13/16 [phone]    . lisinopril (PRINIVIL,ZESTRIL) tablet 5 mg  5 mg Oral Daily Maeola Harman, MD   5 mg at 10/16/16 5621  . metoprolol tartrate (LOPRESSOR) tablet 12.5 mg  12.5 mg Oral BID Maeola Harman, MD   12.5 mg at 10/16/16 3086  . mupirocin ointment (BACTROBAN) 2 % 1 application  1 application Nasal BID Maeola Harman, MD   1 application at 10/15/16 2158  . ondansetron (ZOFRAN) injection 4 mg  4 mg Intravenous Q6H PRN Maeola Harman, MD   4 mg at 10/12/16 1905  . oxybutynin (DITROPAN) tablet 5 mg  5 mg Oral BID Maeola Harman, MD   5 mg at 10/16/16 5784  . oxyCODONE (Oxy IR/ROXICODONE) immediate release tablet 5-10 mg  5-10 mg Oral Q4H PRN Maeola Harman, MD   10 mg at 10/16/16  0933  . pantoprazole (PROTONIX) EC tablet 20 mg  20 mg Oral Daily Maeola Harman, MD   20 mg at 10/16/16 6962  . potassium chloride SA (K-DUR,KLOR-CON) CR tablet 20 mEq  20 mEq Oral Daily Maeola Harman, MD   20 mEq at 10/16/16 9528  . sodium chloride flush (NS) 0.9 % injection 3 mL  3 mL Intravenous Q12H Maeola Harman, MD   3 mL at 10/15/16 2158  . sodium chloride flush (NS) 0.9 % injection 3 mL  3 mL Intravenous PRN Maeola Harman, MD   3 mL at 10/14/16 4132     Discharge Medications: Please see discharge summary for a list of discharge medications.  Relevant Imaging Results:  Relevant Lab Results:   Additional Information SS# [id number]  Althea Charon, LCSW

## 2016-10-29 ENCOUNTER — Encounter: Payer: Self-pay | Admitting: Vascular Surgery

## 2016-10-30 ENCOUNTER — Encounter: Payer: Self-pay | Admitting: Vascular Surgery

## 2016-10-30 ENCOUNTER — Ambulatory Visit (INDEPENDENT_AMBULATORY_CARE_PROVIDER_SITE_OTHER): Payer: Self-pay | Admitting: Vascular Surgery

## 2016-10-30 VITALS — BP 100/62 | HR 52 | Temp 98.3°F | Resp 18 | Ht 62.0 in | Wt 125.0 lb

## 2016-10-30 DIAGNOSIS — T8189XD Other complications of procedures, not elsewhere classified, subsequent encounter: Secondary | ICD-10-CM

## 2016-10-30 DIAGNOSIS — I739 Peripheral vascular disease, unspecified: Secondary | ICD-10-CM

## 2016-10-30 NOTE — Progress Notes (Signed)
Subjective:     Patient ID: Andrea [name], female   DOB: 07-28-1950, 66 y.o.   MRN: [id number]  HPI 66yo female here for evaluation of her left foot wound after recent left SFA and popliteal artery stenting as well as external iliac artery stenting on the left. She remains on Plavix. She does have a wound in her left medial leg that is being treated this is healing well. She is not on any fevers or chills this time. She is concerned about the toe amputation site.   Review of Systems Left foot pain    Objective:   Physical Exam [id number] Left groin has healed Left leg incision with hypertrophic tissue Strong left dp signal Left toe amp site with wound breakdown and fibrinous exudate        Assessment/plan     65yo female here for follow-up from recent left lower 70 stenting and first and second Ray dictations. Indications I was completely broken down and sutures were removed and was debrided today until bleeding tissue was encountered. Wet-to-dry dressing was placed. I also perform silver nitrate cauterization of her hypertrophic tissue on her leg incision and a dry dressing was placed. She will keep her 2 week follow-up to recheck her wound should she have fevers or chills L need to see her sooner she'll need to return to the emergency department. She should continue Plavix given her recent stenting. She will also continue her postop shoe.     Andrea Anthis C. Randie Heinzain, MD [facility] [place] Office: [phone] Pager: [phone]

## 2016-11-06 ENCOUNTER — Encounter: Payer: BLUE CROSS/BLUE SHIELD | Admitting: Vascular Surgery

## 2016-11-13 ENCOUNTER — Encounter: Payer: BLUE CROSS/BLUE SHIELD | Admitting: Vascular Surgery

## 2016-11-13 ENCOUNTER — Other Ambulatory Visit: Payer: Self-pay | Admitting: *Deleted

## 2016-11-13 ENCOUNTER — Encounter: Payer: Self-pay | Admitting: Vascular Surgery

## 2016-11-13 ENCOUNTER — Ambulatory Visit (INDEPENDENT_AMBULATORY_CARE_PROVIDER_SITE_OTHER): Payer: Self-pay | Admitting: Vascular Surgery

## 2016-11-13 ENCOUNTER — Encounter: Payer: Self-pay | Admitting: *Deleted

## 2016-11-13 VITALS — BP 137/58 | HR 70 | Temp 98.5°F | Resp 14 | Ht 62.0 in | Wt 129.0 lb

## 2016-11-13 DIAGNOSIS — I739 Peripheral vascular disease, unspecified: Secondary | ICD-10-CM

## 2016-11-13 DIAGNOSIS — T8189XD Other complications of procedures, not elsewhere classified, subsequent encounter: Secondary | ICD-10-CM

## 2016-11-13 MED FILL — LANTUS SOLOSTAR 100 UNITS/M: 100 | 30 days supply | Qty: 9 | Fill #1

## 2016-11-13 NOTE — Progress Notes (Signed)
Patient ID: Andrea [name], female   DOB: 10-16-1950, 66 y.o.   MRN: [id number]  Reason for Consult: PAD (f/u)   Referred by Jaclyn ShaggyAmao, Enobong, MD  Subjective:     HPI:  Andrea Brookingoni B Leedom is a 66 y.o. female follows up from recent left first and second toe irritation.  2 weeks ago we saw her and she had breakdown of the wound we removed the staples and microdebrider.  She has been placing wet-to-dry dressing she does not have any fever chills or erythema.  Past Medical History:  Diagnosis Date  . Asthma   . Coronary artery disease   . Diabetes mellitus without complication (HCC)   . Dyspnea   . Fibromyalgia   . GERD (gastroesophageal reflux disease)   . High cholesterol   . Hypertension   . PONV (postoperative nausea and vomiting)   . PVD (peripheral vascular disease) (HCC)   . Urinary frequency   . Urinary incontinence    Family History  Problem Relation Age of Onset  . Stroke Mother    Past Surgical History:  Procedure Laterality Date  . ABDOMINAL AORTOGRAM W/LOWER EXTREMITY N/A 03/25/2016   Procedure: Abdominal Aortogram w/Lower Extremity;  Surgeon: Maeola HarmanBrandon Christopher Thandiwe Siragusa, MD;  [facility];  Service: Cardiovascular;  Laterality: N/A;  . ABDOMINAL AORTOGRAM W/LOWER EXTREMITY N/A 10/12/2016   Procedure: ABDOMINAL AORTOGRAM W/LOWER EXTREMITY;  Surgeon: Maeola Harmanain, Emillio Ngo Christopher, MD;  [facility];  Service: Cardiovascular;  Laterality: N/A;  . AMPUTATION TOE Left 10/13/2016   Procedure: AMPUTATION LEFT GREAT TOE AND SECOND TOE;  Surgeon: Maeola Harmanain, Adilynn Bessey Christopher, MD;  [facility];  Service: Vascular;  Laterality: Left;  . AORTOGRAM N/A 09/16/2016   Procedure: AORTOGRAM WITH LEFT LOWER EXTREMITY RUNOFF;  Surgeon: Sherren KernsFields, Charles E, MD;  [facility];  Service: Vascular;  Laterality: N/A;  . APPLICATION OF WOUND VAC Right 06/14/2016   Procedure: APPLICATION OF WOUND VAC;  Surgeon: Sherren KernsFields, Charles E, MD;  Location: [facility] OR;  Service: Vascular;   Laterality: Right;  . CORONARY ARTERY BYPASS GRAFT    . CORONARY STENT INTERVENTION N/A 04/15/2016   Procedure: Coronary Stent Intervention;  Surgeon: Lennette Biharihomas A Kelly, MD;  Location: [facility] INVAS[facility] CV LAB;  Service: Cardiovascular;  Laterality: N/A;  . ENDARTERECTOMY FEMORAL Right 05/21/2016   Procedure: RIGHT FEMORAL ENDARTERECTOMY;  Surgeon: Maeola Harmanain, Huckleberry Martinson Christopher, MD;  [facility];  Service: Vascular;  Laterality: Right;  . ENDARTERECTOMY FEMORAL Left 07/20/2016   Procedure: LEFT FEMORAL ENDARTERECTOMY;  Surgeon: Maeola Harmanain, Corynn Solberg Christopher, MD;  [facility];  Service: Vascular;  Laterality: Left;  . FEMORAL BYPASS Left 07/20/2016   Left common femoral endarterectomy  . FEMORAL-POPLITEAL BYPASS GRAFT Right 05/21/2016   Procedure: RIGHT FEMORAL-BELOW KNEE POPLITEAL ARTERY BYPASS GRAFT USING PROPATEN GORETEX GRAFT;  Surgeon: Maeola Harmanain, Olia Hinderliter Christopher, MD;  [facility];  Service: Vascular;  Laterality: Right;  . FEMORAL-POPLITEAL BYPASS GRAFT Left 07/20/2016   Procedure: LEFT FEMORAL TO BELOW KNEE POPLITEAL ARTERY  BYPASS GRAFT;  Surgeon: Maeola Harmanain, Maybel Dambrosio Christopher, MD;  [facility];  Service: Vascular;  Laterality: Left;  . GROIN DEBRIDEMENT Right 06/14/2016   Procedure: GROIN DEBRIDEMENT;  Surgeon: Sherren KernsFields, Charles E, MD;  [facility];  Service: Vascular;  Laterality: Right;  . I&D EXTREMITY N/A 09/12/2016   Procedure: Irrigation and Debridment Left Groin; Closure of Distal Incisions Left Leg; Medial Calf and Medial Thigh;  Surgeon: Maeola Harmanain, Latanga Nedrow Christopher, MD;  [facility];  Service: Vascular;  Laterality: N/A;  .  KNEE SURGERY    . LEFT HEART CATH AND CORS/GRAFTS ANGIOGRAPHY N/A 04/15/2016   Procedure: Left Heart Cath and Cors/Grafts Angiography;  Surgeon: Lennette Bihari, MD;  [facility]: [facility] IN[facility] LAB;  Service: Cardiovascular;  Laterality: N/A;  . MULTIPLE TOOTH EXTRACTIONS    . MUSCLE FLAP CLOSURE Left 09/18/2016   Procedure: LEFT GRACILIS MUSCLE FLAP TO LEFT GROIN;  Surgeon:  Glenna Fellows, MD;  Location: [facility] OR;  Service: Plastics;  Laterality: Left;  . PATCH ANGIOPLASTY Left 09/10/2016   Procedure: Bovine PATCH ANGIOPLASTY Left Femoral Artery and Below Knee Popliteal Artery;  Surgeon: Fransisco Hertz, MD;  Location: [facility];  Service: Vascular;  Laterality: Left;  . PERIPHERAL VASCULAR INTERVENTION Left 10/12/2016   Procedure: PERIPHERAL VASCULAR INTERVENTION;  Surgeon: Maeola Harman, MD;  [facility];  Service: Cardiovascular;  Laterality: Left;  . REMOVAL OF GRAFT Left 09/10/2016   Procedure: Excision of Left Femoral-Popliteal Graft, Placement of Antibiotic Beads Left Leg;  Surgeon: Fransisco Hertz, MD;  Location: [facility] OR;  Service: Vascular;  Laterality: Left;    Short Social History:  Social History  Substance Use Topics  . Smoking status: Former Smoker    Packs/day: 0.25    Types: Cigarettes    Quit date: 04/04/2016  . Smokeless tobacco: Never Used  . Alcohol use No    Allergies  Allergen Reactions  . Naproxen Itching  . Tramadol Hcl Itching    Current Outpatient Prescriptions  Medication Sig Dispense Refill  . albuterol (PROVENTIL HFA;VENTOLIN HFA) 108 (90 Base) MCG/ACT inhaler Inhale 2 puffs into the lungs every 6 (six) hours as needed for wheezing or shortness of breath. 54 Inhaler 3  . aspirin EC 81 MG tablet Take 1 tablet (81 mg total) by mouth daily. 90 tablet 3  . atorvastatin (LIPITOR) 80 MG tablet Take 1 tablet (80 mg total) by mouth daily. 90 tablet 3  . clopidogrel (PLAVIX) 75 MG tablet Take 1 tablet (75 mg total) by mouth daily with breakfast. 30 tablet 3  . DULoxetine (CYMBALTA) 60 MG capsule Take 1 capsule (60 mg total) by mouth daily. 60 capsule 1  . Fluticasone-Salmeterol (ADVAIR) 100-50 MCG/DOSE AEPB Inhale 1 puff into the lungs 2 (two) times daily. 180 each 3  . furosemide (LASIX) 20 MG tablet Take 1 tablet (20 mg total) by mouth daily. 30 tablet 1  . gabapentin (NEURONTIN) 300 MG capsule Take 300 mg by mouth  3 (three) times daily.  3  . hydrochlorothiazide (HYDRODIURIL) 25 MG tablet Take 25 mg by mouth daily.  0  . HYDROcodone-acetaminophen (NORCO/VICODIN) 5-325 MG tablet Take 1 tablet by mouth every 6 (six) hours as needed for moderate pain. 20 tablet 0  . ibuprofen (ADVIL,MOTRIN) 200 MG tablet Take 400 mg by mouth every 6 (six) hours as needed for mild pain.     . Insulin Glargine (LANTUS SOLOSTAR) 100 UNIT/ML Solostar Pen Inject 30 Units into the skin at bedtime. 5 pen 5  . lisinopril (PRINIVIL,ZESTRIL) 5 MG tablet Take 1 tablet (5 mg total) by mouth daily. 30 tablet 5  . metoprolol tartrate (LOPRESSOR) 25 MG tablet Take 0.5 tablets (12.5 mg total) by mouth 2 (two) times daily. 90 tablet 3  . oxybutynin (DITROPAN) 5 MG tablet TAKE 1 TABLET BY MOUTH 2 TIMES DAILY. 60 tablet 2  . pantoprazole (PROTONIX) 20 MG tablet Take 1 tablet (20 mg total) by mouth daily. 30 tablet 1  . potassium chloride SA (K-DUR,KLOR-CON) 20 MEQ tablet Take 20  mEq by mouth daily.    . pregabalin (LYRICA) 100 MG capsule Take 1 capsule (100 mg total) by mouth 2 (two) times daily. 60 capsule 3  . cyclobenzaprine (FLEXERIL) 5 MG tablet Take 5 mg by mouth every 6 (six) hours as needed (pain/muscle spams.).   0   No current facility-administered medications for this visit.     Review of Systems  Constitutional:  Constitutional negative. Skin: Positive for wound.  Psychiatric: Psychiatric negative.        Objective:  Objective   Vitals:   11/13/16 0901  BP: (!) 137/58  Pulse: 70  Resp: 14  Temp: 98.5 F (36.9 C)  SpO2: 100%  Weight: 129 lb (58.5 kg)  Height: 5\' 2"  (1.575 m)   Body mass index is 23.59 kg/m.  Physical Exam  Constitutional: She is oriented to person, place, and time. She appears well-developed.  HENT:  Head: Normocephalic.  Eyes: Pupils are equal, round, and reactive to light.  Neck: Normal range of motion.  Cardiovascular: Normal rate.   Strong left AT signal  Pulmonary/Chest: Effort  normal.  Abdominal: Soft.  Musculoskeletal: Normal range of motion. She exhibits no edema.  Neurological: She is alert and oriented to person, place, and time.  Skin:  Scabbing along medial thigh and leg incisions Left foot amputation site non-healing  Psychiatric: She has a normal mood and affect. Her behavior is normal. Judgment and thought content normal.     Assessment/Plan:     66 year old female now follows up with nonhealing wound at the site of her left first and second toe dictation site.  I discussed the options of continued wound care versus debridement versus transmetatarsal amputation.  Given that the foot does appear viable elsewhere except for the third and fifth toe tip gangrene I think that she does have reasonable chance to heal a transmetatarsal amputaition.  If this does not heal she will need a below-knee amputation.  We discussed the risk and benefits she wants to proceed with surgery next week.     Maeola Harman MD [facility]

## 2016-11-18 ENCOUNTER — Other Ambulatory Visit: Payer: Self-pay

## 2016-11-18 ENCOUNTER — Encounter (HOSPITAL_COMMUNITY): Payer: Self-pay | Admitting: *Deleted

## 2016-11-18 ENCOUNTER — Encounter: Payer: Self-pay | Admitting: Cardiovascular Disease

## 2016-11-18 ENCOUNTER — Ambulatory Visit (INDEPENDENT_AMBULATORY_CARE_PROVIDER_SITE_OTHER): Payer: BLUE CROSS/BLUE SHIELD | Admitting: Cardiovascular Disease

## 2016-11-18 VITALS — BP 112/62 | HR 72 | Ht 62.0 in | Wt 121.8 lb

## 2016-11-18 DIAGNOSIS — I739 Peripheral vascular disease, unspecified: Secondary | ICD-10-CM | POA: Diagnosis not present

## 2016-11-18 DIAGNOSIS — I2510 Atherosclerotic heart disease of native coronary artery without angina pectoris: Secondary | ICD-10-CM

## 2016-11-18 NOTE — Progress Notes (Signed)
Pt denies SOB and chest pain. Pt under the care of Dr. Elease HashimotoNahser, Cardiology. Pt made aware to stop taking vitamins, fish oi and herbal medications. Do not take any NSAIDs ie: Ibuprofen, Advil, Naproxen (Aleve), Motrin, BC and Goody Powder. Pt stated that she stopped Plavix on 11/14/16. Pt made aware to take half dose of Lantus insulin tonight (16 units). Pt made aware to check BG every 2 hours prior to arrival to hospital on DOS. Pt made aware to treat a BG < 70 with  4 ounces of  cranberry juice, wait 15 minutes after intervention to recheck BG, if BG remains < 70, call Short Stay unit to speak with a nurse. Pt verbalized understanding of all pre-op instructions. Anesthesia asked to review pt history.

## 2016-11-18 NOTE — Progress Notes (Signed)
Cardiology Office Note   Date:  11/18/2016   ID:  Andrea [name], DOB July 21, 1950, MRN [id number]  PCP:  Jaclyn ShaggyAmao, Enobong, MD  Cardiologist:   Kristeen MissPhilip Mariadejesus Cade, MD   Problem list 1. Peripheral arterial disease 2. Essential hypertension 3. Diabetes mellitus 4. CAD - hx of  stenting 2000 and CABG 2006 ( [place])    Chief Complaint  Patient presents with  . Coronary Artery Disease      History of Present Illness: Andrea [name] is a 66 y.o. female who presents for pre operative evaluation prior to possible PV surgery .   She has some DOE walking up a hill or up stairs.  Has had CABG and a previous stent   Quit smoking a month ago .  Has not had any angina  Does not get regular exercise - is severely limited by leg pain .  She's had lots of issues with her peripheral arterial disease. She has several nonhealing wounds on her legs. She's had an angiogram by Dr. Randie Heinzain and he has set up this cardiology assessment. I'm assuming that this is for preoperative evaluation.  April 29, 2016:  Andrea [name] returns for further management of her CAD She has severe PVD and needs to have PV surgery  Preoperatively, she had an ischemic work up that showed the following :  Myoview showed a reversible defect in the anterior wall.  Cath April 13, 2016 showed total occlusions of her native Coronaries.   IMA to LAD was patent. SVG to OM patent.  SVG to distal RCA had a tight stenosis that was stented with a 3.5 x 12 Synergy stent.   Post dilated to 3.78 mm.  She is feeling better.  Had nausea and vomitting for the first 5 days after she got home.   Has improved   Has stopped smoking  Vascular surgery has been rescheduled for May 10.   Nov.6 2018;  Had her vascular surgery in May.    Healing continues to be an issue.  Is scheduled for amputation of 3 toes tomorrow due to non healing .   No CP  plavix has been held Stopped smoking in March of this year  No angina ,  Breathing is OK   Past Medical  History:  Diagnosis Date  . Arthritis   . Asthma   . Coronary artery disease   . Diabetes mellitus without complication (HCC)   . Dyspnea   . Fibromyalgia   . GERD (gastroesophageal reflux disease)   . High cholesterol   . Hypertension   . 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 PONV (postoperative nausea and vomiting)   . PVD (peripheral vascular disease) (HCC)   . Urinary frequency   . Urinary incontinence     Past Surgical History:  Procedure Laterality Date  . ABDOMINAL HYSTERECTOMY    . COLONOSCOPY    . CORONARY ARTERY BYPASS GRAFT    . FEMORAL BYPASS Left 07/20/2016   Left common femoral endarterectomy  . KNEE SURGERY    . MULTIPLE TOOTH EXTRACTIONS       Current Outpatient Medications  Medication Sig Dispense Refill  . albuterol (PROVENTIL HFA;VENTOLIN HFA) 108 (90 Base) MCG/ACT inhaler Inhale 2 puffs into the lungs every 6 (six) hours as needed for wheezing or shortness of breath. 54 Inhaler 3  . aspirin EC 81 MG tablet Take 1 tablet (81 mg total) by mouth daily. 90 tablet 3  . atorvastatin (LIPITOR) 80 MG  tablet Take 1 tablet (80 mg total) by mouth daily. 90 tablet 3  . clopidogrel (PLAVIX) 75 MG tablet Take 1 tablet (75 mg total) by mouth daily with breakfast. 30 tablet 3  . cyclobenzaprine (FLEXERIL) 5 MG tablet Take 5 mg by mouth every 6 (six) hours as needed (pain/muscle spams.).   0  . DULoxetine (CYMBALTA) 60 MG capsule Take 1 capsule (60 mg total) by mouth daily. 60 capsule 1  . Fluticasone-Salmeterol (ADVAIR) 100-50 MCG/DOSE AEPB Inhale 1 puff into the lungs 2 (two) times daily. 180 each 3  . furosemide (LASIX) 20 MG tablet Take 1 tablet (20 mg total) by mouth daily. 30 tablet 1  . gabapentin (NEURONTIN) 300 MG capsule Take 300 mg by mouth 3 (three) times daily.  3  . hydrochlorothiazide (HYDRODIURIL) 25 MG tablet Take 25 mg by mouth daily.  0  . HYDROcodone-acetaminophen (NORCO/VICODIN) 5-325 MG tablet Take 1 tablet by mouth every 6 (six) hours  as needed for moderate pain. 20 tablet 0  . ibuprofen (ADVIL,MOTRIN) 200 MG tablet Take 400 mg by mouth every 6 (six) hours as needed for mild pain.     . Insulin Glargine (LANTUS SOLOSTAR) 100 UNIT/ML Solostar Pen Inject 30 Units into the skin at bedtime. 5 pen 5  . lisinopril (PRINIVIL,ZESTRIL) 5 MG tablet Take 1 tablet (5 mg total) by mouth daily. 30 tablet 5  . metFORMIN (GLUCOPHAGE) 1000 MG tablet Take 1,000 mg 2 (two) times daily by mouth.  3  . metoprolol tartrate (LOPRESSOR) 25 MG tablet Take 0.5 tablets (12.5 mg total) by mouth 2 (two) times daily. 90 tablet 3  . oxybutynin (DITROPAN) 5 MG tablet TAKE 1 TABLET BY MOUTH 2 TIMES DAILY. 60 tablet 2  . pantoprazole (PROTONIX) 20 MG tablet Take 1 tablet (20 mg total) by mouth daily. 30 tablet 1  . potassium chloride SA (K-DUR,KLOR-CON) 20 MEQ tablet Take 20 mEq by mouth daily.    . pregabalin (LYRICA) 100 MG capsule Take 1 capsule (100 mg total) by mouth 2 (two) times daily. 60 capsule 3   No current facility-administered medications for this visit.     No flowsheet data found.    Allergies:   Naproxen and Tramadol hcl    Social History:  The patient  reports that she quit smoking about 7 months ago. Her smoking use included cigarettes. She smoked 0.25 packs per day. she has never used smokeless tobacco. She reports that she does not drink alcohol or use drugs.   Family History:  The patient's family history includes Stroke in her mother.    ROS:  Please see the history of present illness.    Review of Systems: As per HPI, otherwise negative      All other systems are reviewed and negative.   Physical Exam: Blood pressure 112/62, pulse 72, height 5\' 2"  (1.575 m), weight 121 lb 12.8 oz (55.2 kg), SpO2 93 %.  GEN:  Well nourished, well developed in no acute distress HEENT: Normal NECK: No JVD; bilateral carotid bruits,  L > R  LYMPHATICS: No lymphadenopathy CARDIAC: RR, soft systolic murmur , rubs, gallops RESPIRATORY:   Clear to auscultation without rales, wheezing or rhonchi  ABDOMEN: Soft, non-tender, non-distended MUSCULOSKELETAL:  No edema;  Left foot bandaged.  SKIN: Warm and dry NEUROLOGIC:  Alert and oriented x 3   EKG:  Personally reviewed   March 24, 2016:   NSR , lateral TWI.     Recent Labs: 09/10/2016: ALT 12 09/22/2016: Magnesium 1.5  10/13/2016: BUN 30; Creatinine, Ser 1.16; Hemoglobin 8.5; Platelets 247; Potassium 4.8; Sodium 139    Lipid Panel    Component Value Date/Time   CHOL 309 (H) 10/16/2015 1200   TRIG 258 (H) 10/16/2015 1200   HDL 56 10/16/2015 1200   CHOLHDL 5.5 (H) 10/16/2015 1200   VLDL 52 (H) 10/16/2015 1200   LDLCALC 201 (H) 10/16/2015 1200      Wt Readings from Last 3 Encounters:  11/18/16 121 lb 12.8 oz (55.2 kg)  11/13/16 129 lb (58.5 kg)  10/30/16 125 lb (56.7 kg)      Other studies Reviewed: Additional studies/ records that were reviewed today include: . Review of the above records demonstrates:    ASSESSMENT AND PLAN:  1.  CAD:   Hx of stenting of LCx in 2000 and hx of CABG in 2006.    Cath April 13, 2016 showed total occlusions of her native Coronaries.   IMA to LAD was patent. SVG to OM patent.  SVG to distal RCA had a tight stenosis that was stented with a 3.5 x 12 Synergy stent.   Post dilated to 3.78 mm.  Continue atorvastatin   She did well with her previous surgery in May. Needs to have additional surgery tomorrow She is at low-moderate risk for this amputation surgery  plavix is on hold   2. PAD :  She has severe vascular disease - bilat. Carotid disease. Lower ext. PVD.    Has stopped smoking  .  3.  Hyperlipidemia :  Will check labs in 6 months .  Current medicines are reviewed at length with the patient today.  The patient does not have concerns regarding medicines.  Labs/ tests ordered today include:  No orders of the defined types were placed in this encounter.    Disposition:   FU with me in 3-4  months      Kristeen MissPhilip  Patrena Santalucia, MD  11/18/2016 1:51 PM    [facility] [address], [place], [place]  [address] Phone: [phone]) [phone]; Fax: [phone]) [phone]

## 2016-11-18 NOTE — Patient Instructions (Signed)

## 2016-11-18 NOTE — Progress Notes (Signed)
Anesthesia chart review: SAME DAY WORK-UP.  Patient is a 66 year old female scheduled for left transmetatarsal amputation on 11/19/16 by Dr. Lemar LivingsBrandon Cain. Case is posted for general anesthesia. She is s/p right FPBG 05/21/16, left FPBG 07/20/16 complicated by proximal anastomotic dehiscence s/p I&D left groin hematoma with excision of left FPBG and bovine patch angioplasty of left CFA and left popliteal artery and thrombectomy left ATA 09/10/16, left gracilis muscle flap to left groin 09/18/16, left great toe amputation 10/13/16.   History includes PAD (BLE with non-healing right foot wound; s/p right CFA endarterectomy/FPBG with Emeline DarlingGore Propaten 05/21/16; s/p right groin debridement/Wound VAC 06/14/16; s/p left FPBG 07/20/16 complicated by proximal anastomotic dehiscence s/p I&D left groin hematoma with excision of left FPBG and bovine patch angioplasty of left CFA and left popliteal artery and thrombectomy left ATA 09/10/16; s/p left gracilis muscle flap to left groin 09/18/16; s/p left SFA, popliteal, external iliac artery stents 10/12/16; s/p left great toe amputation 10/13/16), former smoker (quit 04/04/16), CAD s/p stent '00 and CABG (LIMA-LAD, SVG-OM, SVG-PDA) '06 s/p Synergy stent SVG-PDA 04/15/16, post-operative N/V, fibromyalgia, DM2, HTN, GERD, asthma, dyspnea, hypercholesterolemia.   - PCP is Dr. Jaclyn ShaggyEnobong Amao, last visit 08/28/16.  - Cardiologist is Dr. Kristeen MissPhilip Nahser, last visit 11/18/16. He is aware of surgery plans and that Plavix is temporarily on hold. She is felt to be at low-moderate CV risk.   Medications include albuterol, aspirin 81 mg, Lipitor, Plavix, Flexeril, Cymbalta, Advair, Lasix, Neurontin, HCTZ, Norco, Lantus, lisinopril, Lopressor, Ditropan, Protonix, KCl, Lyrica. Per VVS instructions, patient to hold Plavix starting 11/14/16 but continue ASA.  EKG 06/14/16: NSR, ST/T wave abnormality, consider anterolateral ischemia. No significant change since last tracing.  Cardiac cath 04/15/16 (Dr. Nicki Guadalajarahomas  Kelly; done due to anterior ischemia on 04/13/16 stress test): Conclusion:  Ost LAD to Prox LAD lesion, 95 %stenosed.  Prox LAD lesion, 100 %stenosed.  Ost Cx to Prox Cx lesion, 30 %stenosed.  Ost RCA lesion, 100 %stenosed.  LIMA and is normal in caliber.  Mid Cx lesion, 100 %stenosed.  A STENT SYNERGY DES 3.5X12 drug eluting stent was successfully placed.  Dist Graft lesion, 85% stenosed.  Post intervention, there is a 0% residual stenosis.  Ost RCA to Dist RCA lesion, 100 %stenosed.  The left ventricular ejection fraction is 50-55% by visual estimate.  Dist LAD lesion, 40 %stenosed. Severe native CAD with 95% very proximal stenosis of the LAD before small first diagonal branch with total occlusion of the LAD after this diagonal; patent stent at the ostium of the circumflex with in-stent narrowing of 30% with total occlusion of the OM vessel after a small AV groove circumflex in atrial branch; and total occlusion of the RCA at its origin. - Patent LIMA graft supplying the mid LAD with mild 40% narrowing in the mid distal LAD. - Patent SVG supplying the circumflex marginal vessel - SVG supplying the PDA has a focal 85% distal graft stenosis. - Low normal LV function with an ejection fraction of 50-55% with subtle mild upper mid anterolateral hypocontractility. - Successful PCI to the SVG supplying the PDA with ultimate insertion of a 3.512 mm Synergy stent postdilated to 3.78 mm with the 85% stenosis being reduced to 0%. RECOMMENDATION:The patient received a Synergy stent. She will require dual antiplatelet therapy with aspirin and Plavix. Prior to intervention, there was discussion with Dr. Randie Heinzain about deferring her planned vascular surgery for next week for minimum of a month, but ultimate decision regarding timing will be necessary.  Echo 04/13/16: Study Conclusions - Left ventricle: The cavity size was normal. Wall thickness was increased in a pattern of mild LVH. Systolic  function was normal. The estimated ejection fraction was in the range of 55% to 60%. Wall motion was normal; there were no regional wall motion abnormalities. Features are consistent with a pseudonormal left ventricular filling pattern, with concomitant abnormal relaxation and increased filling pressure (grade 2 diastolic dysfunction). - Left atrium: The atrium was moderately dilated. - Atrial septum: No defect or patent foramen ovale was identified.  Carotid U/S 04/02/16: Impression: 1. Doppler velocities suggest 1-39% right proximal ICA stenosis. 2. Doppler velocities suggest 60-79% (highest in the range) left proximal ICA stenosis.  She will need updated labs prior to surgery.   Patient is > 6 months out from 04/2016 PCI and saw cardiology today. She has undergone numerous vascular procedures including surgery for left groin complications from graft dehiscence with groin hematoma in the setting of dual anti-platelet therapy and multiple underlying co-morbidities. Dr. Randie Heinzain is holding her Plavix but continuing ASA. If labs acceptable and no acute changes then I anticipate that she can proceed as planned.  Velna Ochsllison Zelenak, PA-C [facility] Phone [phone] 11/18/2016 3:16 PM

## 2016-11-19 ENCOUNTER — Encounter (HOSPITAL_COMMUNITY)
Admission: RE | Disposition: A | Discharge status: Rehabilitation Facility | Payer: Self-pay | Source: Ambulatory Visit | Attending: Vascular Surgery

## 2016-11-19 ENCOUNTER — Inpatient Hospital Stay (HOSPITAL_COMMUNITY): Payer: BLUE CROSS/BLUE SHIELD | Admitting: Vascular Surgery

## 2016-11-19 ENCOUNTER — Inpatient Hospital Stay (HOSPITAL_COMMUNITY)
Admission: RE | Admit: 2016-11-19 | Discharge: 2016-11-21 | DRG: 908 | Disposition: A | Discharge status: Rehabilitation Facility | Payer: BLUE CROSS/BLUE SHIELD | Source: Ambulatory Visit | Attending: Vascular Surgery | Admitting: Vascular Surgery

## 2016-11-19 ENCOUNTER — Encounter (HOSPITAL_COMMUNITY): Payer: Self-pay | Admitting: *Deleted

## 2016-11-19 ENCOUNTER — Other Ambulatory Visit: Payer: Self-pay

## 2016-11-19 DIAGNOSIS — R42 Dizziness and giddiness: Secondary | ICD-10-CM | POA: Diagnosis not present

## 2016-11-19 DIAGNOSIS — E1152 Type 2 diabetes mellitus with diabetic peripheral angiopathy with gangrene: Secondary | ICD-10-CM | POA: Diagnosis present

## 2016-11-19 DIAGNOSIS — R2689 Other abnormalities of gait and mobility: Secondary | ICD-10-CM | POA: Diagnosis not present

## 2016-11-19 DIAGNOSIS — M797 Fibromyalgia: Secondary | ICD-10-CM | POA: Diagnosis present

## 2016-11-19 DIAGNOSIS — Z823 Family history of stroke: Secondary | ICD-10-CM

## 2016-11-19 DIAGNOSIS — Z955 Presence of coronary angioplasty implant and graft: Secondary | ICD-10-CM

## 2016-11-19 DIAGNOSIS — Z90710 Acquired absence of both cervix and uterus: Secondary | ICD-10-CM

## 2016-11-19 DIAGNOSIS — R55 Syncope and collapse: Secondary | ICD-10-CM | POA: Diagnosis not present

## 2016-11-19 DIAGNOSIS — Z87891 Personal history of nicotine dependence: Secondary | ICD-10-CM

## 2016-11-19 DIAGNOSIS — Z4781 Encounter for orthopedic aftercare following surgical amputation: Secondary | ICD-10-CM | POA: Diagnosis not present

## 2016-11-19 DIAGNOSIS — X58XXXA Exposure to other specified factors, initial encounter: Secondary | ICD-10-CM | POA: Diagnosis present

## 2016-11-19 DIAGNOSIS — K219 Gastro-esophageal reflux disease without esophagitis: Secondary | ICD-10-CM | POA: Diagnosis present

## 2016-11-19 DIAGNOSIS — Z7982 Long term (current) use of aspirin: Secondary | ICD-10-CM | POA: Diagnosis not present

## 2016-11-19 DIAGNOSIS — T8131XA Disruption of external operation (surgical) wound, not elsewhere classified, initial encounter: Principal | ICD-10-CM | POA: Diagnosis present

## 2016-11-19 DIAGNOSIS — Z794 Long term (current) use of insulin: Secondary | ICD-10-CM | POA: Diagnosis not present

## 2016-11-19 DIAGNOSIS — E7800 Pure hypercholesterolemia, unspecified: Secondary | ICD-10-CM | POA: Diagnosis present

## 2016-11-19 DIAGNOSIS — E1142 Type 2 diabetes mellitus with diabetic polyneuropathy: Secondary | ICD-10-CM | POA: Diagnosis not present

## 2016-11-19 DIAGNOSIS — J45909 Unspecified asthma, uncomplicated: Secondary | ICD-10-CM | POA: Diagnosis present

## 2016-11-19 DIAGNOSIS — Z79899 Other long term (current) drug therapy: Secondary | ICD-10-CM

## 2016-11-19 DIAGNOSIS — I2510 Atherosclerotic heart disease of native coronary artery without angina pectoris: Secondary | ICD-10-CM | POA: Diagnosis present

## 2016-11-19 DIAGNOSIS — Z951 Presence of aortocoronary bypass graft: Secondary | ICD-10-CM

## 2016-11-19 DIAGNOSIS — I10 Essential (primary) hypertension: Secondary | ICD-10-CM | POA: Diagnosis present

## 2016-11-19 DIAGNOSIS — Z791 Long term (current) use of non-steroidal anti-inflammatories (NSAID): Secondary | ICD-10-CM

## 2016-11-19 DIAGNOSIS — I739 Peripheral vascular disease, unspecified: Secondary | ICD-10-CM

## 2016-11-19 DIAGNOSIS — D62 Acute posthemorrhagic anemia: Secondary | ICD-10-CM

## 2016-11-19 DIAGNOSIS — E162 Hypoglycemia, unspecified: Secondary | ICD-10-CM | POA: Diagnosis not present

## 2016-11-19 DIAGNOSIS — E119 Type 2 diabetes mellitus without complications: Secondary | ICD-10-CM | POA: Diagnosis not present

## 2016-11-19 DIAGNOSIS — Z7902 Long term (current) use of antithrombotics/antiplatelets: Secondary | ICD-10-CM | POA: Diagnosis not present

## 2016-11-19 DIAGNOSIS — G8918 Other acute postprocedural pain: Secondary | ICD-10-CM | POA: Diagnosis not present

## 2016-11-19 DIAGNOSIS — I96 Gangrene, not elsewhere classified: Secondary | ICD-10-CM | POA: Diagnosis not present

## 2016-11-19 DIAGNOSIS — Z89432 Acquired absence of left foot: Secondary | ICD-10-CM | POA: Diagnosis not present

## 2016-11-19 DIAGNOSIS — N179 Acute kidney failure, unspecified: Secondary | ICD-10-CM | POA: Diagnosis not present

## 2016-11-19 DIAGNOSIS — R7989 Other specified abnormal findings of blood chemistry: Secondary | ICD-10-CM | POA: Diagnosis not present

## 2016-11-19 DIAGNOSIS — I25810 Atherosclerosis of coronary artery bypass graft(s) without angina pectoris: Secondary | ICD-10-CM | POA: Diagnosis not present

## 2016-11-19 HISTORY — DX: Unspecified infectious disease: B99.9

## 2016-11-19 HISTORY — PX: TRANSMETATARSAL AMPUTATION: SHX6197

## 2016-11-19 HISTORY — DX: Unspecified osteoarthritis, unspecified site: M19.90

## 2016-11-19 LAB — BASIC METABOLIC PANEL
Anion gap: 7 (ref 5–15)
BUN: 29 mg/dL — ABNORMAL HIGH (ref 6–20)
CALCIUM: 8.8 mg/dL — AB (ref 8.9–10.3)
CO2: 24 mmol/L (ref 22–32)
CREATININE: 1.08 mg/dL — AB (ref 0.44–1.00)
Chloride: 110 mmol/L (ref 101–111)
GFR calc Af Amer: >60 mL/min (ref >60)
GFR calc non Af Amer: 53 mL/min — ABNORMAL LOW (ref >60)
GLUCOSE: 175 mg/dL — AB (ref 65–99)
Potassium: 4.6 mmol/L (ref 3.5–5.1)
Sodium: 141 mmol/L (ref 135–145)

## 2016-11-19 LAB — HEMOGLOBIN A1C
Hgb A1c MFr Bld: 7.3 % — ABNORMAL HIGH (ref 4.8–5.6)
Hgb A1c MFr Bld: 7.2 % — ABNORMAL HIGH (ref 4.8–5.6)
Mean Plasma Glucose: 162.81 mg/dL
Mean Plasma Glucose: 159.94 mg/dL

## 2016-11-19 LAB — CREATININE, SERUM: CREATININE: 0.97 mg/dL (ref 0.44–1.00)

## 2016-11-19 LAB — GLUCOSE, CAPILLARY
GLUCOSE-CAPILLARY: 230 mg/dL — AB (ref 65–99)
Glucose-Capillary: 171 mg/dL — ABNORMAL HIGH (ref 65–99)
Glucose-Capillary: 125 mg/dL — ABNORMAL HIGH (ref 65–99)
Glucose-Capillary: 102 mg/dL — ABNORMAL HIGH (ref 65–99)

## 2016-11-19 LAB — CBC
HEMATOCRIT: 27.7 % — AB (ref 36.0–46.0)
HEMATOCRIT: 26.5 % — AB (ref 36.0–46.0)
HEMOGLOBIN: 8.1 g/dL — AB (ref 12.0–15.0)
Hemoglobin: 8.4 g/dL — ABNORMAL LOW (ref 12.0–15.0)
MCH: 28.2 pg (ref 26.0–34.0)
MCH: 27.9 pg (ref 26.0–34.0)
MCHC: 30.3 g/dL (ref 30.0–36.0)
MCHC: 30.6 g/dL (ref 30.0–36.0)
MCV: 93 fL (ref 78.0–100.0)
MCV: 91.4 fL (ref 78.0–100.0)
Platelets: 284 10*3/uL (ref 150–400)
Platelets: 283 10*3/uL (ref 150–400)
RBC: 2.98 MIL/uL — ABNORMAL LOW (ref 3.87–5.11)
RBC: 2.9 MIL/uL — ABNORMAL LOW (ref 3.87–5.11)
RDW: 15.5 % (ref 11.5–15.5)
RDW: 15.1 % (ref 11.5–15.5)
WBC: 8 10*3/uL (ref 4.0–10.5)
WBC: 9.2 10*3/uL (ref 4.0–10.5)

## 2016-11-19 LAB — SURGICAL PCR SCREEN
MRSA, PCR: NEGATIVE
Staphylococcus aureus: NEGATIVE

## 2016-11-19 SURGERY — AMPUTATION, FOOT, TRANSMETATARSAL
Anesthesia: General | Site: Foot | Laterality: Left

## 2016-11-19 MED ORDER — METOPROLOL TARTRATE 5 MG/5ML IV SOLN: 2.0000 mg | INTRAVENOUS | Status: DC | PRN

## 2016-11-19 MED ORDER — CLOPIDOGREL BISULFATE 75 MG PO TABS
75.0000 mg | ORAL_TABLET | Freq: Every day | ORAL | Status: DC
  Administered 2016-11-20 – 2016-11-21 (×2): 75 mg via ORAL
  Filled 2016-11-19 (×2): qty 1

## 2016-11-19 MED ORDER — PHENYLEPHRINE HCL 10 MG/ML IJ SOLN
INTRAMUSCULAR | Status: DC | PRN
  Administered 2016-11-19 (×3): 80 ug via INTRAVENOUS
  Administered 2016-11-19: 120 ug via INTRAVENOUS

## 2016-11-19 MED ORDER — SODIUM CHLORIDE 0.9 % IV SOLN
INTRAVENOUS | Status: DC
  Administered 2016-11-19: 18:00:00 via INTRAVENOUS

## 2016-11-19 MED ORDER — ALBUTEROL SULFATE (2.5 MG/3ML) 0.083% IN NEBU: 2.5000 mg | INHALATION_SOLUTION | Freq: Four times a day (QID) | RESPIRATORY_TRACT | Status: DC | PRN

## 2016-11-19 MED ORDER — MIDAZOLAM HCL 2 MG/2ML IJ SOLN
INTRAMUSCULAR | Status: DC | PRN
  Administered 2016-11-19: 1 mg via INTRAVENOUS

## 2016-11-19 MED ORDER — FUROSEMIDE 20 MG PO TABS
20.0000 mg | ORAL_TABLET | Freq: Every day | ORAL | Status: DC
  Administered 2016-11-19 – 2016-11-21 (×2): 20 mg via ORAL
  Filled 2016-11-19 (×3): qty 1

## 2016-11-19 MED ORDER — SODIUM CHLORIDE 0.9 % IV SOLN
INTRAVENOUS | Status: DC
  Administered 2016-11-19: 10:00:00 via INTRAVENOUS

## 2016-11-19 MED ORDER — MIDAZOLAM HCL 2 MG/2ML IJ SOLN
INTRAMUSCULAR | Status: AC
  Filled 2016-11-19: qty 2

## 2016-11-19 MED ORDER — ATORVASTATIN CALCIUM 80 MG PO TABS
80.0000 mg | ORAL_TABLET | Freq: Every day | ORAL | Status: DC
  Administered 2016-11-19 – 2016-11-20 (×2): 80 mg via ORAL
  Filled 2016-11-19 (×2): qty 1

## 2016-11-19 MED ORDER — BISACODYL 5 MG PO TBEC
5.0000 mg | DELAYED_RELEASE_TABLET | Freq: Every day | ORAL | Status: DC | PRN
  Administered 2016-11-20: 5 mg via ORAL
  Filled 2016-11-19: qty 1

## 2016-11-19 MED ORDER — PREGABALIN 100 MG PO CAPS
100.0000 mg | ORAL_CAPSULE | Freq: Two times a day (BID) | ORAL | Status: DC
  Administered 2016-11-19 – 2016-11-21 (×4): 100 mg via ORAL
  Filled 2016-11-19 (×4): qty 1

## 2016-11-19 MED ORDER — FENTANYL CITRATE (PF) 100 MCG/2ML IJ SOLN: 25.0000 ug | INTRAMUSCULAR | Status: DC | PRN

## 2016-11-19 MED ORDER — OXYBUTYNIN CHLORIDE 5 MG PO TABS
5.0000 mg | ORAL_TABLET | Freq: Two times a day (BID) | ORAL | Status: DC
  Administered 2016-11-19 – 2016-11-21 (×4): 5 mg via ORAL
  Filled 2016-11-19 (×4): qty 1

## 2016-11-19 MED ORDER — CHLORHEXIDINE GLUCONATE 4 % EX LIQD: 60.0000 mL | Freq: Once | CUTANEOUS | Status: DC

## 2016-11-19 MED ORDER — MORPHINE SULFATE (PF) 2 MG/ML IV SOLN
1.0000 mg | INTRAVENOUS | Status: DC | PRN
  Administered 2016-11-19 – 2016-11-20 (×2): 2 mg via INTRAVENOUS
  Filled 2016-11-19 (×2): qty 1

## 2016-11-19 MED ORDER — INSULIN GLARGINE 100 UNIT/ML ~~LOC~~ SOLN
30.0000 [IU] | Freq: Every day | SUBCUTANEOUS | Status: DC
  Administered 2016-11-19 – 2016-11-20 (×2): 30 [IU] via SUBCUTANEOUS
  Filled 2016-11-19 (×3): qty 0.3

## 2016-11-19 MED ORDER — PANTOPRAZOLE SODIUM 20 MG PO TBEC
20.0000 mg | DELAYED_RELEASE_TABLET | Freq: Every day | ORAL | Status: DC
  Administered 2016-11-20 – 2016-11-21 (×2): 20 mg via ORAL
  Filled 2016-11-19 (×2): qty 1

## 2016-11-19 MED ORDER — IBUPROFEN 400 MG PO TABS: 400.0000 mg | ORAL_TABLET | Freq: Four times a day (QID) | ORAL | Status: DC | PRN

## 2016-11-19 MED ORDER — POTASSIUM CHLORIDE CRYS ER 20 MEQ PO TBCR
20.0000 meq | EXTENDED_RELEASE_TABLET | Freq: Every day | ORAL | Status: DC
  Administered 2016-11-19 – 2016-11-21 (×3): 20 meq via ORAL
  Filled 2016-11-19 (×3): qty 1

## 2016-11-19 MED ORDER — ASPIRIN EC 81 MG PO TBEC
81.0000 mg | DELAYED_RELEASE_TABLET | Freq: Every day | ORAL | Status: DC
  Administered 2016-11-20 – 2016-11-21 (×2): 81 mg via ORAL
  Filled 2016-11-19 (×2): qty 1

## 2016-11-19 MED ORDER — SODIUM CHLORIDE 0.9 % IV SOLN
INTRAVENOUS | Status: DC | PRN
  Administered 2016-11-19: 11:00:00 via INTRAVENOUS

## 2016-11-19 MED ORDER — HYDROCODONE-ACETAMINOPHEN 5-325 MG PO TABS
1.0000 | ORAL_TABLET | ORAL | Status: DC | PRN
  Administered 2016-11-19 – 2016-11-20 (×2): 2 via ORAL
  Filled 2016-11-19 (×2): qty 2

## 2016-11-19 MED ORDER — FENTANYL CITRATE (PF) 100 MCG/2ML IJ SOLN
INTRAMUSCULAR | Status: DC | PRN
  Administered 2016-11-19: 100 ug via INTRAVENOUS
  Administered 2016-11-19 (×2): 50 ug via INTRAVENOUS

## 2016-11-19 MED ORDER — DULOXETINE HCL 60 MG PO CPEP
60.0000 mg | ORAL_CAPSULE | Freq: Every day | ORAL | Status: DC
  Administered 2016-11-20 – 2016-11-21 (×2): 60 mg via ORAL
  Filled 2016-11-19 (×2): qty 1

## 2016-11-19 MED ORDER — MUPIROCIN 2 % EX OINT
1.0000 "application " | TOPICAL_OINTMENT | Freq: Once | CUTANEOUS | Status: AC
  Administered 2016-11-19: 1 via TOPICAL

## 2016-11-19 MED ORDER — GUAIFENESIN-DM 100-10 MG/5ML PO SYRP: 15.0000 mL | ORAL_SOLUTION | ORAL | Status: DC | PRN

## 2016-11-19 MED ORDER — DEXTROSE 5 % IV SOLN
1.5000 g | INTRAVENOUS | Status: AC
  Administered 2016-11-19: 1.5 g via INTRAVENOUS
  Filled 2016-11-19: qty 1.5

## 2016-11-19 MED ORDER — DEXTROSE 5 % IV SOLN
1.5000 g | Freq: Two times a day (BID) | INTRAVENOUS | Status: AC
  Administered 2016-11-19 – 2016-11-20 (×2): 1.5 g via INTRAVENOUS
  Filled 2016-11-19 (×2): qty 1.5

## 2016-11-19 MED ORDER — GABAPENTIN 300 MG PO CAPS
300.0000 mg | ORAL_CAPSULE | Freq: Three times a day (TID) | ORAL | Status: DC
  Administered 2016-11-19 – 2016-11-21 (×5): 300 mg via ORAL
  Filled 2016-11-19 (×5): qty 1

## 2016-11-19 MED ORDER — LISINOPRIL 5 MG PO TABS
5.0000 mg | ORAL_TABLET | Freq: Every day | ORAL | Status: DC
  Administered 2016-11-19 – 2016-11-21 (×2): 5 mg via ORAL
  Filled 2016-11-19 (×3): qty 1

## 2016-11-19 MED ORDER — ONDANSETRON HCL 4 MG/2ML IJ SOLN
INTRAMUSCULAR | Status: DC | PRN
  Administered 2016-11-19: 4 mg via INTRAVENOUS

## 2016-11-19 MED ORDER — SENNOSIDES-DOCUSATE SODIUM 8.6-50 MG PO TABS
1.0000 | ORAL_TABLET | Freq: Every evening | ORAL | Status: DC | PRN
  Administered 2016-11-20: 1 via ORAL
  Filled 2016-11-19: qty 1

## 2016-11-19 MED ORDER — PROPOFOL 10 MG/ML IV BOLUS
INTRAVENOUS | Status: DC | PRN
  Administered 2016-11-19: 140 mg via INTRAVENOUS

## 2016-11-19 MED ORDER — MOMETASONE FURO-FORMOTEROL FUM 100-5 MCG/ACT IN AERO
2.0000 | INHALATION_SPRAY | Freq: Two times a day (BID) | RESPIRATORY_TRACT | Status: DC
  Administered 2016-11-19 – 2016-11-21 (×4): 2 via RESPIRATORY_TRACT
  Filled 2016-11-19: qty 8.8

## 2016-11-19 MED ORDER — ALUM & MAG HYDROXIDE-SIMETH 200-200-20 MG/5ML PO SUSP: 15.0000 mL | ORAL | Status: DC | PRN

## 2016-11-19 MED ORDER — ONDANSETRON HCL 4 MG/2ML IJ SOLN: 4.0000 mg | Freq: Four times a day (QID) | INTRAMUSCULAR | Status: DC | PRN

## 2016-11-19 MED ORDER — HYDROCHLOROTHIAZIDE 25 MG PO TABS
25.0000 mg | ORAL_TABLET | Freq: Every day | ORAL | Status: DC
  Administered 2016-11-19 – 2016-11-21 (×2): 25 mg via ORAL
  Filled 2016-11-19 (×3): qty 1

## 2016-11-19 MED ORDER — PHENOL 1.4 % MT LIQD: 1.0000 | OROMUCOSAL | Status: DC | PRN

## 2016-11-19 MED ORDER — LIDOCAINE 2% (20 MG/ML) 5 ML SYRINGE
INTRAMUSCULAR | Status: DC | PRN
  Administered 2016-11-19: 50 mg via INTRAVENOUS

## 2016-11-19 MED ORDER — LABETALOL HCL 5 MG/ML IV SOLN: 10.0000 mg | INTRAVENOUS | Status: DC | PRN

## 2016-11-19 MED ORDER — FENTANYL CITRATE (PF) 250 MCG/5ML IJ SOLN
INTRAMUSCULAR | Status: AC
  Filled 2016-11-19: qty 5

## 2016-11-19 MED ORDER — METOPROLOL TARTRATE 12.5 MG HALF TABLET
12.5000 mg | ORAL_TABLET | Freq: Two times a day (BID) | ORAL | Status: DC
  Administered 2016-11-19 – 2016-11-21 (×3): 12.5 mg via ORAL
  Filled 2016-11-19 (×4): qty 1

## 2016-11-19 MED ORDER — METFORMIN HCL 500 MG PO TABS
1000.0000 mg | ORAL_TABLET | Freq: Two times a day (BID) | ORAL | Status: DC
  Administered 2016-11-19 – 2016-11-21 (×4): 1000 mg via ORAL
  Filled 2016-11-19 (×4): qty 2

## 2016-11-19 MED ORDER — INSULIN ASPART 100 UNIT/ML ~~LOC~~ SOLN
0.0000 [IU] | Freq: Three times a day (TID) | SUBCUTANEOUS | Status: DC
  Administered 2016-11-20 – 2016-11-21 (×2): 2 [IU] via SUBCUTANEOUS

## 2016-11-19 MED ORDER — GLYCOPYRROLATE 0.2 MG/ML IJ SOLN
INTRAMUSCULAR | Status: DC | PRN
  Administered 2016-11-19: 0.2 mg via INTRAVENOUS

## 2016-11-19 MED ORDER — DEXAMETHASONE SODIUM PHOSPHATE 10 MG/ML IJ SOLN
INTRAMUSCULAR | Status: DC | PRN
  Administered 2016-11-19: 5 mg via INTRAVENOUS

## 2016-11-19 MED ORDER — 0.9 % SODIUM CHLORIDE (POUR BTL) OPTIME
TOPICAL | Status: DC | PRN
  Administered 2016-11-19: 1000 mL

## 2016-11-19 MED ORDER — DOCUSATE SODIUM 100 MG PO CAPS
100.0000 mg | ORAL_CAPSULE | Freq: Every day | ORAL | Status: DC
  Administered 2016-11-20 – 2016-11-21 (×2): 100 mg via ORAL
  Filled 2016-11-19 (×2): qty 1

## 2016-11-19 MED ORDER — POTASSIUM CHLORIDE CRYS ER 20 MEQ PO TBCR: 20.0000 meq | EXTENDED_RELEASE_TABLET | Freq: Every day | ORAL | Status: DC | PRN

## 2016-11-19 MED ORDER — MUPIROCIN 2 % EX OINT
TOPICAL_OINTMENT | CUTANEOUS | Status: AC
  Filled 2016-11-19: qty 22

## 2016-11-19 MED ORDER — HEPARIN SODIUM (PORCINE) 5000 UNIT/ML IJ SOLN
5000.0000 [IU] | Freq: Three times a day (TID) | INTRAMUSCULAR | Status: DC
  Administered 2016-11-19 – 2016-11-21 (×5): 5000 [IU] via SUBCUTANEOUS
  Filled 2016-11-19 (×5): qty 1

## 2016-11-19 MED ORDER — MAGNESIUM SULFATE 2 GM/50ML IV SOLN
2.0000 g | Freq: Every day | INTRAVENOUS | Status: DC | PRN
  Filled 2016-11-19: qty 50

## 2016-11-19 MED ORDER — HYDRALAZINE HCL 20 MG/ML IJ SOLN: 5.0000 mg | INTRAMUSCULAR | Status: DC | PRN

## 2016-11-19 SURGICAL SUPPLY — 35 items
BANDAGE ACE 4X5 VEL STRL LF (GAUZE/BANDAGES/DRESSINGS) ×2 IMPLANT
BLADE AVERAGE 25X9 (BLADE) ×2 IMPLANT
BLADE SAW SGTL 81X20 HD (BLADE) IMPLANT
BNDG CONFORM 3 STRL LF (GAUZE/BANDAGES/DRESSINGS) IMPLANT
BNDG GAUZE ELAST 4 BULKY (GAUZE/BANDAGES/DRESSINGS) ×2 IMPLANT
CANISTER SUCT 3000ML PPV (MISCELLANEOUS) ×2 IMPLANT
COVER SURGICAL LIGHT HANDLE (MISCELLANEOUS) ×2 IMPLANT
DRAPE EXTREMITY TIBURON (DRAPES) ×1 IMPLANT
DRAPE HALF SHEET 40X57 (DRAPES) ×2 IMPLANT
DRSG ADAPTIC 3X8 NADH LF (GAUZE/BANDAGES/DRESSINGS) ×1 IMPLANT
ELECT REM PT RETURN 9FT ADLT (ELECTROSURGICAL) ×2
ELECTRODE REM PT RTRN 9FT ADLT (ELECTROSURGICAL) ×1 IMPLANT
GAUZE SPONGE 4X4 12PLY STRL (GAUZE/BANDAGES/DRESSINGS) ×2 IMPLANT
GAUZE SPONGE 4X4 12PLY STRL LF (GAUZE/BANDAGES/DRESSINGS) ×1 IMPLANT
GLOVE BIO SURGEON STRL SZ7.5 (GLOVE) ×2 IMPLANT
GOWN STRL REUS W/ TWL LRG LVL3 (GOWN DISPOSABLE) ×2 IMPLANT
GOWN STRL REUS W/ TWL XL LVL3 (GOWN DISPOSABLE) ×1 IMPLANT
GOWN STRL REUS W/TWL LRG LVL3 (GOWN DISPOSABLE) ×4
GOWN STRL REUS W/TWL XL LVL3 (GOWN DISPOSABLE) ×2
KIT BASIN OR (CUSTOM PROCEDURE TRAY) ×2 IMPLANT
KIT ROOM TURNOVER OR (KITS) ×2 IMPLANT
NDL HYPO 25GX1X1/2 BEV (NEEDLE) IMPLANT
NEEDLE HYPO 25GX1X1/2 BEV (NEEDLE) IMPLANT
NS IRRIG 1000ML POUR BTL (IV SOLUTION) ×2 IMPLANT
PACK GENERAL/GYN (CUSTOM PROCEDURE TRAY) ×2 IMPLANT
PAD ARMBOARD 7.5X6 YLW CONV (MISCELLANEOUS) ×4 IMPLANT
SPECIMEN JAR SMALL (MISCELLANEOUS) ×2 IMPLANT
SUT ETHILON 3 0 PS 1 (SUTURE) ×2 IMPLANT
SUT VIC AB 2-0 CT1 27 (SUTURE) ×4
SUT VIC AB 2-0 CT1 TAPERPNT 27 (SUTURE) IMPLANT
SYR CONTROL 10ML LL (SYRINGE) IMPLANT
TOWEL GREEN STERILE (TOWEL DISPOSABLE) ×2 IMPLANT
TOWEL GREEN STERILE FF (TOWEL DISPOSABLE) ×2 IMPLANT
UNDERPAD 30X30 (UNDERPADS AND DIAPERS) ×2 IMPLANT
WATER STERILE IRR 1000ML POUR (IV SOLUTION) ×2 IMPLANT

## 2016-11-19 NOTE — Op Note (Signed)
    Patient name: Andrea [name] MRN: [id number] DOB: 1950/08/27 Sex: female  11/19/2016 Pre-operative Diagnosis: surgical wound dehiscence and gangrene of left toes 3-5 Post-operative diagnosis:  Same Surgeon:  Apolinar JunesBrandon C. Randie Heinzain, MD Assistant: Debbora PrestoSteve Eureste, SA Procedure Performed: 1.  Left transmetatarsal amputation 2.  Trimming of right toenails    Indications:  66yo female with left lower extremity endovascular revascularization toe amputation of left 1st and 2nd toes now with wound breakdown  Findings: adequate bleeding noted in wound bed   Procedure:  The patient was identified in the holding area and taken to the OR. She was sterilely prepped and draped in the left ankle and foot, given antibiotics and timeout called. We began by marking a fishmouth incision and then traced with #10 blade. Cautery was used to the level of bones. The bones were transected with bone saw with inferior bevel and smoothed with rasp. Cautery was used to excise the specimen and obtain hemostasis. The wound was irrigated and reapproximated the flaps with 3-0 vicryl deep sutures and 3-0 nylon interrupted at the skin and dry dressing was applied. We then turned attention the left foot. Each nail was trimmed with bone clipped and filed with rasp. She was then allowed to awaken from anesthesia without complication. All counts were correct.    EBL: 50cc     Mayara Paulson C. Randie Heinzain, MD [facility] Office: [phone] Pager: [phone]

## 2016-11-19 NOTE — H&P (Signed)
   History and Physical Update  The patient was interviewed and re-examined.  The patient's previous History and Physical has been reviewed and is unchanged from previous office visit. Plan for left TMA today.  Italo Banton C. Randie Heinzain, MD [facility] Office: [phone] Pager: [phone]   11/19/2016, 9:48 AM

## 2016-11-19 NOTE — Anesthesia Postprocedure Evaluation (Signed)
Anesthesia Post Note  Patient: Andrea [name]  Procedure(s) Performed: TRANSMETATARSAL AMPUTATION (Left Foot)     Patient location during evaluation: PACU Anesthesia Type: General Level of consciousness: awake Pain management: pain level controlled Vital Signs Assessment: post-procedure vital signs reviewed and stable Respiratory status: spontaneous breathing Cardiovascular status: stable Anesthetic complications: no    Last Vitals:  Vitals:   11/19/16 1305 11/19/16 1315  BP: (!) 149/54 (!) 119/50  Pulse: 99 86  Resp: 16   Temp: 36.7 C   SpO2: 100%     Last Pain:  Vitals:   11/19/16 1305  TempSrc:   PainSc: 0-No pain                 Kaizen Ibsen

## 2016-11-19 NOTE — Anesthesia Procedure Notes (Signed)
Procedure Name: LMA Insertion Date/Time: 11/19/2016 12:08 PM Performed by: Marena ChancyBeckner, Nthony Lefferts S, CRNA Pre-anesthesia Checklist: Patient identified, Emergency Drugs available, Suction available and Patient being monitored Patient Re-evaluated:Patient Re-evaluated prior to induction Oxygen Delivery Method: Circle System Utilized Preoxygenation: Pre-oxygenation with 100% oxygen Induction Type: IV induction Ventilation: Mask ventilation without difficulty LMA: LMA inserted LMA Size: 4.0 Number of attempts: 1 Airway Equipment and Method: Bite block Placement Confirmation: positive ETCO2 Tube secured with: Tape Dental Injury: Teeth and Oropharynx as per pre-operative assessment

## 2016-11-19 NOTE — Anesthesia Preprocedure Evaluation (Addendum)
Anesthesia Evaluation  Patient identified by MRN, date of birth, ID band Patient awake    Reviewed: Allergy & Precautions, NPO status , Patient's Chart, lab work & pertinent test results  History of Anesthesia Complications (+) PONV  Airway Mallampati: II  TM Distance: >3 FB Neck ROM: Full    Dental  (+) Edentulous Upper, Dental Advisory Given   Pulmonary shortness of breath, asthma , former smoker,    breath sounds clear to auscultation       Cardiovascular hypertension, + angina + CAD and + Peripheral Vascular Disease   Rhythm:Regular Rate:Normal     Neuro/Psych    GI/Hepatic Neg liver ROS, GERD  ,  Endo/Other  diabetes  Renal/GU negative Renal ROS     Musculoskeletal  (+) Arthritis ,   Abdominal   Peds  Hematology   Anesthesia Other Findings   Reproductive/Obstetrics                           Anesthesia Physical Anesthesia Plan  ASA: III  Anesthesia Plan: General   Post-op Pain Management:    Induction: Intravenous  PONV Risk Score and Plan: 3 and Treatment may vary due to age or medical condition, Ondansetron, Dexamethasone and Propofol infusion  Airway Management Planned: LMA  Additional Equipment:   Intra-op Plan:   Post-operative Plan: Extubation in OR  Informed Consent: I have reviewed the patients History and Physical, chart, labs and discussed the procedure including the risks, benefits and alternatives for the proposed anesthesia with the patient or authorized representative who has indicated his/her understanding and acceptance.   Dental advisory given  Plan Discussed with: CRNA and Anesthesiologist  Anesthesia Plan Comments:        Anesthesia Quick Evaluation

## 2016-11-19 NOTE — Transfer of Care (Signed)
Immediate Anesthesia Transfer of Care Note  Patient: Andrea [name] B Tep  Procedure(s) Performed: TRANSMETATARSAL AMPUTATION (Left Foot)  Patient Location: PACU  Anesthesia Type:General  Level of Consciousness: awake, alert  and oriented  Airway & Oxygen Therapy: Patient Spontanous Breathing and Patient connected to nasal cannula oxygen  Post-op Assessment: Report given to RN and Post -op Vital signs reviewed and stable  Post vital signs: Reviewed and stable  Last Vitals:  Vitals:   11/19/16 0947  BP: (!) 147/32  Pulse: (!) 55  Resp: 18  Temp: 36.9 C  SpO2: 95%    Last Pain:  Vitals:   11/19/16 0947  TempSrc: Oral      Patients Stated Pain Goal: 2 (11/19/16 1017)  Complications: No apparent anesthesia complications

## 2016-11-19 NOTE — Plan of Care (Signed)
  Education: Knowledge of General Education information will improve 11/19/2016 2059 - Progressing by Parke PoissonWade, Kieron Kantner N, RN   Activity: Risk for activity intolerance will decrease 11/19/2016 2059 - Progressing by Parke PoissonWade, Reyansh Kushnir N, RN   Safety: Ability to remain free from injury will improve 11/19/2016 2059 - Progressing by Parke PoissonWade, Glora Hulgan N, RN

## 2016-11-20 ENCOUNTER — Encounter (HOSPITAL_COMMUNITY): Payer: Self-pay | Admitting: Vascular Surgery

## 2016-11-20 DIAGNOSIS — D62 Acute posthemorrhagic anemia: Secondary | ICD-10-CM

## 2016-11-20 DIAGNOSIS — G8918 Other acute postprocedural pain: Secondary | ICD-10-CM

## 2016-11-20 DIAGNOSIS — I10 Essential (primary) hypertension: Secondary | ICD-10-CM

## 2016-11-20 DIAGNOSIS — Z89432 Acquired absence of left foot: Secondary | ICD-10-CM

## 2016-11-20 DIAGNOSIS — M797 Fibromyalgia: Secondary | ICD-10-CM

## 2016-11-20 DIAGNOSIS — I25810 Atherosclerosis of coronary artery bypass graft(s) without angina pectoris: Secondary | ICD-10-CM

## 2016-11-20 DIAGNOSIS — I739 Peripheral vascular disease, unspecified: Secondary | ICD-10-CM

## 2016-11-20 DIAGNOSIS — E119 Type 2 diabetes mellitus without complications: Secondary | ICD-10-CM

## 2016-11-20 LAB — CBC
HEMATOCRIT: 25 % — AB (ref 36.0–46.0)
HEMOGLOBIN: 7.6 g/dL — AB (ref 12.0–15.0)
MCH: 27.6 pg (ref 26.0–34.0)
MCHC: 30.4 g/dL (ref 30.0–36.0)
MCV: 90.9 fL (ref 78.0–100.0)
PLATELETS: 255 10*3/uL (ref 150–400)
RBC: 2.75 MIL/uL — AB (ref 3.87–5.11)
RDW: 15 % (ref 11.5–15.5)
WBC: 9.2 10*3/uL (ref 4.0–10.5)

## 2016-11-20 LAB — GLUCOSE, CAPILLARY
GLUCOSE-CAPILLARY: 72 mg/dL (ref 65–99)
GLUCOSE-CAPILLARY: 114 mg/dL — AB (ref 65–99)
GLUCOSE-CAPILLARY: 120 mg/dL — AB (ref 65–99)
GLUCOSE-CAPILLARY: 153 mg/dL — AB (ref 65–99)
Glucose-Capillary: 67 mg/dL (ref 65–99)
Glucose-Capillary: 53 mg/dL — ABNORMAL LOW (ref 65–99)
Glucose-Capillary: 101 mg/dL — ABNORMAL HIGH (ref 65–99)

## 2016-11-20 LAB — BASIC METABOLIC PANEL
Anion gap: 8 (ref 5–15)
BUN: 20 mg/dL (ref 6–20)
CHLORIDE: 106 mmol/L (ref 101–111)
CO2: 25 mmol/L (ref 22–32)
CREATININE: 0.91 mg/dL (ref 0.44–1.00)
Calcium: 8.6 mg/dL — ABNORMAL LOW (ref 8.9–10.3)
GFR calc non Af Amer: >60 mL/min (ref >60)
GLUCOSE: 102 mg/dL — AB (ref 65–99)
Potassium: 4.4 mmol/L (ref 3.5–5.1)
Sodium: 139 mmol/L (ref 135–145)

## 2016-11-20 NOTE — Progress Notes (Signed)
Physical medicine rehabilitation consult requested chart reviewed. [facility] and plan is to return to skilled nursing facility. Hold on formal rehabilitation consult at this time with plan to return to skilled nursing facility.

## 2016-11-20 NOTE — Progress Notes (Signed)
Hypoglycemic Event  CBG: 67  Treatment: 15 GM carbohydrate snack  Symptoms: None  Follow-up CBG: Time:0636 CBG Result:58  Possible Reasons for Event: unknown   0636 Treatment15 GM carbohydrate snack  Follow-up CBG: ZOXW:[id number]:0652 CBG Result:72   Gregor HamsWade, Vaughn Beaumier

## 2016-11-20 NOTE — Progress Notes (Signed)
I met with pt and her sister at bedside to discuss goals and expectation so the inpt rehab admission. They prefer an inpt rehab stay rather than SNF if insurance approves. I will begin insurance authorization for a possible admit when pt medically ready. [phone]

## 2016-11-20 NOTE — Progress Notes (Signed)
Inpatient Diabetes Program Recommendations  AACE/ADA: New Consensus Statement on Inpatient Glycemic Control (2015)  Target Ranges:  Prepandial:   less than 140 mg/dL      Peak postprandial:   less than 180 mg/dL (1-2 hours)      Critically ill patients:  140 - 180 mg/dL    Results for Andrea BrookingSAINTSING, [name] (MRN [id number]) as of 11/20/2016 11:00  Ref. Range 11/19/2016 21:41 11/20/2016 06:05 11/20/2016 06:36 11/20/2016 06:52 11/20/2016 08:28  Glucose-Capillary Latest Ref Range: 65 - 99 mg/dL 086230 (H) 67 53 (L) 72 578114 (H)   Review of Glycemic Control  Diabetes history: DM 2 Outpatient Diabetes medications: Lantus 30 units, Metformin 1000 mg BID Current orders for Inpatient glycemic control: Lantus 30 units, Novolog Sensitive 0-9 units tid, Metformin 1000 mg BID  Inpatient Diabetes Program Recommendations:    Hypoglycemia this am 53 mg/dl. Please consider reducing Lantus to 25 units while inpatient.  Thanks,  Christena DeemShannon Alexxis Mackert RN, MSN, [facility] Inpatient Diabetes Coordinator Team Pager [phone] (8a-5p)

## 2016-11-20 NOTE — PMR Pre-admission (Signed)
PMR Admission Coordinator Pre-Admission Assessment  Patient: Andrea [name] is an 66 y.o., female MRN: [id number] DOB: 1950-01-16 Height: 5\' 2"  (157.5 cm) Weight: 55.2 kg (121 lb 12.8 oz)              Insurance Information HMO:     PPO: yes     PCP:      IPA:      80/20:      OTHER:  PRIMARY: [employer] [place]      Policy#: [id number]      Subscriber: pt CM Name: Wende Neighbors      Phone#: [phone]     Fax#: [phone] Pre-Cert#: [id number] approved until 11/26 when updates are due      Employer: retired Benefits:  Phone #: [phone]     Name: 11/20/2016 Eff. Date: 01/13/2016     Deduct: $400      Out of Pocket Max: $800      Life Max: none CIR: 70%      SNF: 70% 60 days Outpatient: $20 co pay per visit     Co-Pay: 30 visits combined Home Health: 70%      Co-Pay: visits per medical neccesity DME: 70%     Co-Pay: 30% Providers: In network   SECONDARY: none       Medicaid Application Date:       Case Manager:  Disability Application Date:       Case Worker:   Emergency Contact Information Contact Information    Name Relation Home Work Mobile   Andrea [name] Sister [phone]       Current Medical History  Patient Admitting Diagnosis: left TMA  History of Present Illness: HPI: Andrea [name] a 66 y.o.right handed femalediabetes mellitus, fibromyalgia, hypertension, CAD with CABG maintained on aspirin and Plavix, recently stopped smoking 7 months ago, peripheral vascular disease with recent left first and second toe amputations 10/13/2016 was discharged to [facility] and recently had returned home. She lives alone was using a cane prior to admission. She has a sister in the area that works part time. 2 daughters with very little support.  Presented 11/19/2016 with surgical wound dehiscence gangrenous changes of left foot. Wound was not felt to be salvageable. Underwent left transmetatarsal amputation 11/19/2016. Hospital course pain management. Acute blood loss anemia  7.6. Subcutaneous heparin for DVT prophylaxis.   Past Medical History  Past Medical History:  Diagnosis Date  . Arthritis   . Asthma   . Coronary artery disease   . Diabetes mellitus without complication (HCC)   . Dyspnea   . Fibromyalgia   . GERD (gastroesophageal reflux disease)   . High cholesterol   . Hypertension   . 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 PONV (postoperative nausea and vomiting)   . PVD (peripheral vascular disease) (HCC)   . Urinary frequency   . Urinary incontinence     Family History  family history includes Stroke in her mother.  Prior Rehab/Hospitalizations:  Has the patient had major surgery during 100 days prior to admission? Yes  Current Medications   Current Facility-Administered Medications:  .  0.9 %  sodium chloride infusion, , Intravenous, Continuous, Lars Mage, PA-C, Last Rate: 100 mL/hr at 11/19/16 1745 .  albuterol (PROVENTIL) (2.5 MG/3ML) 0.083% nebulizer solution 2.5 mg, 2.5 mg, Inhalation, Q6H PRN, Thomasena Edis, Emma M, PA-C .  alum & mag hydroxide-simeth (MAALOX/MYLANTA) 200-200-20 MG/5ML suspension 15-30 mL, 15-30 mL, Oral, Q2H PRN, Lars Mage, PA-C .  aspirin EC tablet 81 mg, 81 mg, Oral, Daily, Clinton Gallant M, PA-C, 81 mg at 11/20/16 1028 .  atorvastatin (LIPITOR) tablet 80 mg, 80 mg, Oral, q1800, Lars Mage, PA-C, 80 mg at 11/19/16 1756 .  bisacodyl (DULCOLAX) EC tablet 5 mg, 5 mg, Oral, Daily PRN, Thomasena Edis, Emma M, PA-C .  clopidogrel (PLAVIX) tablet 75 mg, 75 mg, Oral, Q breakfast, Clinton Gallant M, PA-C, 75 mg at 11/20/16 0753 .  docusate sodium (COLACE) capsule 100 mg, 100 mg, Oral, Daily, Clinton Gallant M, PA-C, 100 mg at 11/20/16 1031 .  DULoxetine (CYMBALTA) DR capsule 60 mg, 60 mg, Oral, Daily, Clinton Gallant M, PA-C, 60 mg at 11/20/16 1028 .  furosemide (LASIX) tablet 20 mg, 20 mg, Oral, Daily, Clinton Gallant M, PA-C, 20 mg at 11/19/16 1756 .  gabapentin (NEURONTIN) capsule 300 mg, 300 mg, Oral, TID,  Collins, Emma M, PA-C, 300 mg at 11/20/16 1525 .  guaiFENesin-dextromethorphan (ROBITUSSIN DM) 100-10 MG/5ML syrup 15 mL, 15 mL, Oral, Q4H PRN, Thomasena Edis, Emma M, PA-C .  heparin injection 5,000 Units, 5,000 Units, Subcutaneous, Q8H, Collins, Emma M, [place], 5,000 Units at 11/20/16 1525 .  hydrALAZINE (APRESOLINE) injection 5 mg, 5 mg, Intravenous, Q20 Min PRN, Collins, Emma M, PA-C .  hydrochlorothiazide (HYDRODIURIL) tablet 25 mg, 25 mg, Oral, Daily, Clinton Gallant M, PA-C, 25 mg at 11/19/16 1756 .  HYDROcodone-acetaminophen (NORCO/VICODIN) 5-325 MG per tablet 1-2 tablet, 1-2 tablet, Oral, Q4H PRN, Lars Mage, PA-C, 2 tablet at 11/19/16 2149 .  ibuprofen (ADVIL,MOTRIN) tablet 400 mg, 400 mg, Oral, Q6H PRN, Thomasena Edis, Emma M, PA-C .  insulin aspart (novoLOG) injection 0-9 Units, 0-9 Units, Subcutaneous, TID WC, Collins, Emma M, PA-C .  insulin glargine (LANTUS) injection 30 Units, 30 Units, Subcutaneous, QHS, Collins, Emma M, PA-C, 30 Units at 11/19/16 2200 .  labetalol (NORMODYNE,TRANDATE) injection 10 mg, 10 mg, Intravenous, Q10 min PRN, Thomasena Edis, Emma M, PA-C .  lisinopril (PRINIVIL,ZESTRIL) tablet 5 mg, 5 mg, Oral, Daily, Clinton Gallant M, PA-C, 5 mg at 11/19/16 1756 .  magnesium sulfate IVPB 2 g 50 mL, 2 g, Intravenous, Daily PRN, Thomasena Edis, Emma M, PA-C .  metFORMIN (GLUCOPHAGE) tablet 1,000 mg, 1,000 mg, Oral, BID WC, Collins, Emma M, PA-C, 1,000 mg at 11/20/16 1028 .  metoprolol tartrate (LOPRESSOR) injection 2-5 mg, 2-5 mg, Intravenous, Q2H PRN, Thomasena Edis, Emma M, PA-C .  metoprolol tartrate (LOPRESSOR) tablet 12.5 mg, 12.5 mg, Oral, BID, Clinton Gallant M, PA-C, 12.5 mg at 11/19/16 2149 .  mometasone-formoterol (DULERA) 100-5 MCG/ACT inhaler 2 puff, 2 puff, Inhalation, BID, Lars Mage, PA-C, 2 puff at 11/20/16 0754 .  morphine 2 MG/ML injection 1-2 mg, 1-2 mg, Intravenous, Q1H PRN, Lars Mage, PA-C, 2 mg at 11/20/16 0754 .  ondansetron (ZOFRAN) injection 4 mg, 4 mg, Intravenous, Q6H PRN,  Thomasena Edis, Emma M, PA-C .  oxybutynin (DITROPAN) tablet 5 mg, 5 mg, Oral, BID, Clinton Gallant M, PA-C, 5 mg at 11/20/16 1029 .  pantoprazole (PROTONIX) EC tablet 20 mg, 20 mg, Oral, Daily, Clinton Gallant M, PA-C, 20 mg at 11/20/16 1028 .  phenol (CHLORASEPTIC) mouth spray 1 spray, 1 spray, Mouth/Throat, PRN, Thomasena Edis, Emma M, PA-C .  potassium chloride SA (K-DUR,KLOR-CON) CR tablet 20 mEq, 20 mEq, Oral, Daily, Clinton Gallant M, PA-C, 20 mEq at 11/20/16 1029 .  potassium chloride SA (K-DUR,KLOR-CON) CR tablet 20-40 mEq, 20-40 mEq, Oral, Daily PRN, Thomasena Edis, Emma M, PA-C .  pregabalin (LYRICA) capsule 100 mg, 100 mg, Oral, BID, Thomasena Edis, Emma M,  PA-C, 100 mg at 11/20/16 1031 .  senna-docusate (Senokot-S) tablet 1 tablet, 1 tablet, Oral, QHS PRN, Lars Mageollins, Emma M, PA-C  Patients Current Diet: Diet Carb Modified Fluid consistency: Thin; Room service appropriate? Yes  Precautions / Restrictions Precautions Precautions: Fall Restrictions Weight Bearing Restrictions: Yes LLE Weight Bearing: Non weight bearing Other Position/Activity Restrictions: Darco boot ordered for WB through heel   Has the patient had 2 or more falls or a fall with injury in the past year?No  Prior Activity Level Community (5-7x/wk): Mod I with cane pta after most recent surgery. Mod I with cane.  Home Assistive Devices / Equipment Home Assistive Devices/Equipment: Bedside commode/3-in-1 Home Equipment: Walker - 4 wheels, Other (comment), Bedside commode, Tub bench  Prior Device Use: Indicate devices/aids used by the patient prior to current illness, exacerbation or injury? cane  Prior Functional Level Prior Function Level of Independence: Independent with assistive device(s) Comments: Has been home for 2 days, [facility] on [facility] for ambulation and taking sponge baths in sink for shower  Self Care: Did the patient need help bathing, dressing, using the toilet or eating?  Independent  Indoor Mobility: Did the patient need  assistance with walking from room to room (with or without device)? Independent  Stairs: Did the patient need assistance with internal or external stairs (with or without device)? Independent  Functional Cognition: Did the patient need help planning regular tasks such as shopping or remembering to take medications? Needed some help  Current Functional Level Cognition  Overall Cognitive Status: Within Functional Limits for tasks assessed Orientation Level: Oriented X4    Extremity Assessment (includes Sensation/Coordination)  Upper Extremity Assessment: Overall WFL for tasks assessed, Generalized weakness(grossly 4-/5 strength bilaterally)  Lower Extremity Assessment: Defer to PT evaluation RLE Deficits / Details: Grossly 4/5 throughout LLE Deficits / Details: s/p L transmet amputation; L hip/knee grossly 4/5 throughout    ADLs  Overall ADL's : Needs assistance/impaired Eating/Feeding: Set up, Sitting Eating/Feeding Details (indicate cue type and reason): eating lunch at the end of session Grooming: Set up, Wash/dry hands, Wash/dry face, Sitting Grooming Details (indicate cue type and reason): Pt with full ROM with BUE, generalized weakness does not impact grasp on grooming items Upper Body Bathing: Set up, Sitting Lower Body Bathing: Moderate assistance Upper Body Dressing : Set up Lower Body Dressing: Moderate assistance Lower Body Dressing Details (indicate cue type and reason): required assist for socks, at this time unable to bring foot to knees - will need to explore AE options Toilet Transfer: Min guard, Cueing for safety, Stand-pivot, RW, BSC Toilet Transfer Details (indicate cue type and reason): anticipate better safety when Darco shoe is available Toileting- Clothing Manipulation and Hygiene: Maximal assistance, Sit to/from stand Toileting - Clothing Manipulation Details (indicate cue type and reason): Pt currently requires BUE support in standing due to NWB  Functional  mobility during ADLs: Minimal assistance, Rolling walker(stand pivot only) General ADL Comments: decreased balance, decreased strength and activity tolerance    Mobility  Overal bed mobility: Needs Assistance Bed Mobility: Sit to Supine Supine to sit: Min guard, HOB elevated Sit to supine: Mod assist General bed mobility comments: mod A for BLE into bed after transfer from recliner    Transfers  Overall transfer level: Needs assistance Equipment used: Rolling walker (2 wheeled) Transfers: Sit to/from Stand Sit to Stand: Min assist General transfer comment: min A for power up, vc for safe hand placement with RW    Ambulation / Gait / Stairs / Psychologist, prison and probation servicesWheelchair Mobility  Ambulation/Gait Ambulation/Gait assistance: Min guard Assistive device: Rolling walker (2 wheeled) General Gait Details: Amb in room with RW and close min guard; cues for hop-to pattern to maintain NWB on LLE secondary to no arrival of darco boot yet. 2x self-correct LOB posteriorly. Intermittent standing rest breaks secondary to BUE fatigue Gait velocity: Decreased Gait velocity interpretation: <1.8 ft/sec, indicative of risk for recurrent falls    Posture / Balance Dynamic Sitting Balance Sitting balance - Comments: sitting briefly EOB with no back support Balance Overall balance assessment: Needs assistance Sitting-balance support: No upper extremity supported, Feet unsupported Sitting balance-Leahy Scale: Good Sitting balance - Comments: sitting briefly EOB with no back support Standing balance support: Bilateral upper extremity supported, During functional activity Standing balance-Leahy Scale: Poor Standing balance comment: Reliant on BUE support    Special needs/care consideration BiPAP/CPAP  N/a CPM  N/a Continuous Drip IV  N/a Dialysis  N/a Life Vest  N/a Oxygen  N/a Special Bed  N/a Trach Size  N/a Wound Vac n/a Skin surgical incision with dressing and ace wrap Bowel mgmt: LBM 11/5 Bladder  mgmt:incontinent; external catheter Diabetic mgmt yes   Previous Home Environment Living Arrangements: Alone  Lives With: Alone Available Help at Discharge: Available PRN/intermittently, Family(sister, Andrea [name]) Type of Home: Apartment Home Layout: Two level, Bed/bath upstairs, 1/2 bath on main level Alternate Level Stairs-Rails: Right, Left Alternate Level Stairs-Number of Steps: 13 Home Access: Stairs to enter [employer] of Steps: 1 platform Bathroom Shower/Tub: (bathroom upstairs; 1/2 bath downstairs) Bathroom Toilet: Standard Bathroom Accessibility: Yes How Accessible: Accessible via walker Home Care Services: No Additional Comments: Pt recently d/c from SNF and has been home the past 2 days  Discharge Living Setting Plans for Discharge Living Setting: Alone, Apartment Type of Home at Discharge: Apartment Discharge Home Layout: 1/2 bath on main level, Two level(sleeps on couch downstairs) Alternate Level Stairs-Rails: Right, Left Alternate Level Stairs-Number of Steps: 13 Discharge Home Access: Stairs to enter Entrance Stairs-Rails: None Entrance Stairs-Number of Steps: 1 stair Discharge Bathroom Shower/Tub: Tub/shower unit, Curtain(bathroom upstairs) Discharge Bathroom Toilet: Standard Discharge Bathroom Accessibility: Yes How Accessible: Accessible via walker Does the patient have any problems obtaining your medications?: No   Patient and sister state that pt sleeping on couch downstairs due to stairs to get to her bedroom. 1/2 bath downstairs where she sponge bathes. Apartment. I recommended looking into handicapped accessible apartment for the future. They doubt wheelchair mobility ability in home.  Social/Family/Support Systems Contact Information: Andrea [name], sister Anticipated Caregiver: sister intermittent assist at night; she works Anticipated Industrial/product designer Information: see above Ability/Limitations of Caregiver: Andrea [name], works Medical laboratory scientific officer:  Intermittent Discharge Plan Discussed with Primary Caregiver: Yes Is Caregiver In Agreement with Plan?: Yes Does Caregiver/Family have Issues with Lodging/Transportation while Pt is in Rehab?: No  Goals/Additional Needs Patient/Family Goal for Rehab: Mod I PT, Mod I to supervision with OT Expected length of stay: ELOS 7-10 days Pt/Family Agrees to Admission and willing to participate: Yes Program Orientation Provided & Reviewed with Pt/Caregiver Including Roles  & Responsibilities: Yes  Barriers to Discharge: Decreased caregiver support  Decrease burden of Care through IP rehab admission: n/a  Possible need for SNF placement upon discharge:not anticipated. I spoke with pt and her sister, Andrea [name], at bedside on 11/20/16. I explained that BCBS unlikely to pay for both CIR and SNF. If admitted to CIR, SNF d/c would not be an option unless paid for out of pocket.   Patient Condition: This patient's condition remains as documented in the  consult dated 11/20/2016, in which the Rehabilitation Physician determined and documented that the patient's condition is appropriate for intensive rehabilitative care in an inpatient rehabilitation facility. Will admit to inpatient rehab Saturday 11/21/2016.  Preadmission Screen Completed By:  Clois DupesBoyette, Izabel Chim Godwin, 11/20/2016 3:59 PM ______________________________________________________________________   Discussed status with Dr. Wynn BankerKirsteins on 11/20/2016 at  1530 and received telephone approval for admission Saturday 11/21/2016.  Admission Coordinator:  Clois DupesBoyette, Tedd Cottrill Godwin, time 1614/Date 11/20/2016

## 2016-11-20 NOTE — Consult Note (Signed)
Physical Medicine and Rehabilitation Consult Reason for Consult: Decreased functional mobility Referring Physician: Dr. Randie Heinz   HPI: Andrea [name] is a 66 y.o. right handed female diabetes mellitus, fibromyalgia, hypertension, CAD with CABG maintained on aspirin and Plavix, recently stopped smoking 7 months ago, peripheral vascular disease with recent left first and second toe amputations 10/13/2016 was discharged to [facility] and recently had returned home. She lives alone was using a cane prior to admission. She has a sister in the area that works part time. 2 daughters with very little support. 2 level home with one step to entry. Presented 11/19/2016 with surgical wound dehiscence gangrenous changes of left foot. Wound was not felt to be salvageable. Underwent left transmetatarsal amputation 11/19/2016. Hospital course pain management. Acute blood loss anemia 7.6. Physical therapy evaluation completed M.D. has requested physical medicine rehabilitation consult.   Review of Systems  Constitutional: Negative for chills and fever.  HENT: Negative for hearing loss.   Eyes: Negative for blurred vision and double vision.  Respiratory: Positive for cough.        Shortness of breath with exertion  Cardiovascular: Negative for chest pain.  Gastrointestinal: Positive for constipation. Negative for nausea and vomiting.       GERD  Genitourinary: Positive for frequency.  Musculoskeletal: Positive for joint pain and myalgias.  Skin: Negative for rash.  Neurological: Negative for seizures.  All other systems reviewed and are negative.  Past Medical History:  Diagnosis Date  . Arthritis   . Asthma   . Coronary artery disease   . Diabetes mellitus without complication (HCC)   . Dyspnea   . Fibromyalgia   . GERD (gastroesophageal reflux disease)   . High cholesterol   . Hypertension   . 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 PONV (postoperative nausea and  vomiting)   . PVD (peripheral vascular disease) (HCC)   . Urinary frequency   . Urinary incontinence    Past Surgical History:  Procedure Laterality Date  . ABDOMINAL HYSTERECTOMY    . COLONOSCOPY    . CORONARY ARTERY BYPASS GRAFT    . FEMORAL BYPASS Left 07/20/2016   Left common femoral endarterectomy  . KNEE SURGERY    . MULTIPLE TOOTH EXTRACTIONS     Family History  Problem Relation Age of Onset  . Stroke Mother    Social History:  reports that she quit smoking about 7 months ago. Her smoking use included cigarettes. She smoked 0.25 packs per day. she has never used smokeless tobacco. She reports that she does not drink alcohol or use drugs. Allergies:  Allergies  Allergen Reactions  . Naproxen Itching  . Tramadol Hcl Itching   Medications Prior to Admission  Medication Sig Dispense Refill  . albuterol (PROVENTIL HFA;VENTOLIN HFA) 108 (90 Base) MCG/ACT inhaler Inhale 2 puffs into the lungs every 6 (six) hours as needed for wheezing or shortness of breath. 54 Inhaler 3  . aspirin EC 81 MG tablet Take 1 tablet (81 mg total) by mouth daily. 90 tablet 3  . atorvastatin (LIPITOR) 80 MG tablet Take 1 tablet (80 mg total) by mouth daily. 90 tablet 3  . clopidogrel (PLAVIX) 75 MG tablet Take 1 tablet (75 mg total) by mouth daily with breakfast. 30 tablet 3  . DULoxetine (CYMBALTA) 60 MG capsule Take 1 capsule (60 mg total) by mouth daily. 60 capsule 1  . Fluticasone-Salmeterol (ADVAIR) 100-50 MCG/DOSE AEPB Inhale 1 puff into the lungs  2 (two) times daily. 180 each 3  . furosemide (LASIX) 20 MG tablet Take 1 tablet (20 mg total) by mouth daily. 30 tablet 1  . gabapentin (NEURONTIN) 300 MG capsule Take 300 mg by mouth 3 (three) times daily.  3  . hydrochlorothiazide (HYDRODIURIL) 25 MG tablet Take 25 mg by mouth daily.  0  . HYDROcodone-acetaminophen (NORCO/VICODIN) 5-325 MG tablet Take 1 tablet by mouth every 6 (six) hours as needed for moderate pain. 20 tablet 0  . ibuprofen  (ADVIL,MOTRIN) 200 MG tablet Take 400 mg by mouth every 6 (six) hours as needed for mild pain.     . Insulin Glargine (LANTUS SOLOSTAR) 100 UNIT/ML Solostar Pen Inject 30 Units into the skin at bedtime. 5 pen 5  . lisinopril (PRINIVIL,ZESTRIL) 5 MG tablet Take 1 tablet (5 mg total) by mouth daily. 30 tablet 5  . metFORMIN (GLUCOPHAGE) 1000 MG tablet Take 1,000 mg 2 (two) times daily by mouth.  3  . metoprolol tartrate (LOPRESSOR) 25 MG tablet Take 0.5 tablets (12.5 mg total) by mouth 2 (two) times daily. 90 tablet 3  . oxybutynin (DITROPAN) 5 MG tablet TAKE 1 TABLET BY MOUTH 2 TIMES DAILY. (Patient taking differently: TAKE 5 MG BY MOUTH 2 TIMES DAILY.) 60 tablet 2  . pantoprazole (PROTONIX) 20 MG tablet Take 1 tablet (20 mg total) by mouth daily. 30 tablet 1  . potassium chloride SA (K-DUR,KLOR-CON) 20 MEQ tablet Take 20 mEq by mouth daily.    . pregabalin (LYRICA) 100 MG capsule Take 1 capsule (100 mg total) by mouth 2 (two) times daily. 60 capsule 3    Home: Home Living Family/patient expects to be discharged to:: Skilled nursing facility Living Arrangements: Alone Available Help at Discharge: Family, Available PRN/intermittently Type of Home: Apartment Home Access: Stairs to enter Entrance Stairs-Number of Steps: 1 platform Home Layout: Two level, Bed/bath upstairs, 1/2 bath on main level Alternate Level Stairs-Number of Steps: 13 Alternate Level Stairs-Rails: Right, Left Bathroom Shower/Tub: Tub/shower unit, Engineer, building services: Standard Home Equipment: Environmental consultant - 4 wheels, Other (comment), Bedside commode, Tub bench Additional Comments: Pt recently d/c from SNF and has been home the past 2 days  Functional History: Prior Function Level of Independence: Independent with assistive device(s) Comments: Has been home for 2 days, reliant on [facility] for ambulation and bird bathing for shower Functional Status:  Mobility: Bed Mobility Overal bed mobility: Needs Assistance Bed Mobility:  Supine to Sit Supine to sit: Min guard, HOB elevated Transfers Overall transfer level: Needs assistance Equipment used: Rolling walker (2 wheeled) Transfers: Sit to/from Stand Sit to Stand: Min guard General transfer comment: Able to stand on second attempt with RW and min guard for balance; cues for hand placement on RW Ambulation/Gait Ambulation/Gait assistance: Min guard Assistive device: Rolling walker (2 wheeled) General Gait Details: Amb in room with RW and close min guard; cues for hop-to pattern to maintain NWB on LLE secondary to no arrival of darco boot yet. 2x self-correct LOB posteriorly. Intermittent standing rest breaks secondary to BUE fatigue Gait velocity: Decreased Gait velocity interpretation: <1.8 ft/sec, indicative of risk for recurrent falls    ADL:    Cognition: Cognition Overall Cognitive Status: Within Functional Limits for tasks assessed Orientation Level: Oriented X4 Cognition Arousal/Alertness: Awake/alert Behavior During Therapy: WFL for tasks assessed/performed Overall Cognitive Status: Within Functional Limits for tasks assessed  Blood pressure (!) 159/59, pulse (!) 57, temperature 98.1 F (36.7 C), temperature source Oral, resp. rate 12, height  (1.575 m),  weight 55.2 kg (121 lb 12.8 oz), SpO2 100 %. Physical Exam  Vitals reviewed. Constitutional: She is oriented to person, place, and time. She appears well-developed and well-nourished.  HENT:  Head: Normocephalic and atraumatic.  Eyes: EOM are normal. Right eye exhibits no discharge. Left eye exhibits no discharge.  Neck: Normal range of motion. Neck supple. No thyromegaly present.  Cardiovascular: Normal rate and regular rhythm.  Respiratory: Effort normal and breath sounds normal. No respiratory distress.  GI: Soft. Bowel sounds are normal. She exhibits no distension.  Musculoskeletal: She exhibits edema and tenderness.  Neurological: She is alert and oriented to person, place, and  time.  Motor: B/l UE: 4/5 proximal to distal B/l LE: 4+/5, except for left ADF/PF (pain inhibition)  Skin: Skin is warm and dry.  Amputation site is dressed appropriately tender  Psychiatric: She has a normal mood and affect. Her behavior is normal.    Results for orders placed or performed during the hospital encounter of 11/19/16 (from the past 24 hour(s))  Glucose, capillary     Status: Abnormal   Collection Time: 11/19/16  1:09 PM  Result Value Ref Range   Glucose-Capillary 102 (H) 65 - 99 mg/dL  CBC     Status: Abnormal   Collection Time: 11/19/16  6:36 PM  Result Value Ref Range   WBC 9.2 4.0 - 10.5 K/uL   RBC 2.90 (L) 3.87 - 5.11 MIL/uL   Hemoglobin 8.1 (L) 12.0 - 15.0 g/dL   HCT 84.626.5 (L) 96.236.0 - 95.246.0 %   MCV 91.4 78.0 - 100.0 fL   MCH 27.9 26.0 - 34.0 pg   MCHC 30.6 30.0 - 36.0 g/dL   RDW 84.115.1 32.411.5 - 40.115.5 %   Platelets 283 150 - 400 K/uL  Creatinine, serum     Status: None   Collection Time: 11/19/16  6:36 PM  Result Value Ref Range   Creatinine, Ser 0.97 0.44 - 1.00 mg/dL   GFR calc non Af Amer >60 >60 mL/min   GFR calc Af Amer >60 >60 mL/min  Hemoglobin A1c     Status: Abnormal   Collection Time: 11/19/16  6:36 PM  Result Value Ref Range   Hgb A1c MFr Bld 7.2 (H) 4.8 - 5.6 %   Mean Plasma Glucose 159.94 mg/dL  Glucose, capillary     Status: Abnormal   Collection Time: 11/19/16  9:41 PM  Result Value Ref Range   Glucose-Capillary 230 (H) 65 - 99 mg/dL  Basic metabolic panel     Status: Abnormal   Collection Time: 11/20/16  2:16 AM  Result Value Ref Range   Sodium 139 135 - 145 mmol/L   Potassium 4.4 3.5 - 5.1 mmol/L   Chloride 106 101 - 111 mmol/L   CO2 25 22 - 32 mmol/L   Glucose, Bld 102 (H) 65 - 99 mg/dL   BUN 20 6 - 20 mg/dL   Creatinine, Ser 0.270.91 0.44 - 1.00 mg/dL   Calcium 8.6 (L) 8.9 - 10.3 mg/dL   GFR calc non Af Amer >60 >60 mL/min   GFR calc Af Amer >60 >60 mL/min   Anion gap 8 5 - 15  CBC     Status: Abnormal   Collection Time: 11/20/16  2:16  AM  Result Value Ref Range   WBC 9.2 4.0 - 10.5 K/uL   RBC 2.75 (L) 3.87 - 5.11 MIL/uL   Hemoglobin 7.6 (L) 12.0 - 15.0 g/dL   HCT 25.325.0 (L) 66.436.0 - 40.346.0 %  MCV 90.9 78.0 - 100.0 fL   MCH 27.6 26.0 - 34.0 pg   MCHC 30.4 30.0 - 36.0 g/dL   RDW 16.1 09.6 - 04.5 %   Platelets 255 150 - 400 K/uL  Glucose, capillary     Status: None   Collection Time: 11/20/16  6:05 AM  Result Value Ref Range   Glucose-Capillary 67 65 - 99 mg/dL  Glucose, capillary     Status: Abnormal   Collection Time: 11/20/16  6:36 AM  Result Value Ref Range   Glucose-Capillary 53 (L) 65 - 99 mg/dL  Glucose, capillary     Status: None   Collection Time: 11/20/16  6:52 AM  Result Value Ref Range   Glucose-Capillary 72 65 - 99 mg/dL  Glucose, capillary     Status: Abnormal   Collection Time: 11/20/16  8:28 AM  Result Value Ref Range   Glucose-Capillary 114 (H) 65 - 99 mg/dL  Glucose, capillary     Status: Abnormal   Collection Time: 11/20/16 11:24 AM  Result Value Ref Range   Glucose-Capillary 120 (H) 65 - 99 mg/dL   No results found.  Assessment/Plan: Diagnosis: Left TMA Labs independently reviewed.  Records reviewed and summated above.  1. Does the need for close, 24 hr/day medical supervision in concert with the patient's rehab needs make it unreasonable for this patient to be served in a less intensive setting? Yes  2. Co-Morbidities requiring supervision/potential complications: Acute blood loss anemia (transfuse if necessary to ensure appropriate perfusion for increased activity tolerance), peripheral vascular disease (cont meds), diabetes mellitus (Monitor in accordance with exercise and adjust meds as necessary), fibromyalgia (excercise, pain controled), HTN (monitor and provide prns in accordance with increased physical exertion and pain), CAD with CABG (cont meds), Post-op pain (Biofeedback training with therapies to help reduce reliance on opiate pain medications, particularly IV morphine, monitor pain  control during therapies, and sedation at rest and titrate to maximum efficacy to ensure participation and gains in therapies) 3. Due to safety, skin/wound care, disease management, pain management and patient education, does the patient require 24 hr/day rehab nursing? Yes 4. Does the patient require coordinated care of a physician, rehab nurse, PT (1-2 hrs/day, 5 days/week) and OT (1-2 hrs/day, 5 days/week) to address physical and functional deficits in the context of the above medical diagnosis(es)? Yes Addressing deficits in the following areas: balance, endurance, locomotion, strength, transferring, bathing, dressing, toileting and psychosocial support 5. Can the patient actively participate in an intensive therapy program of at least 3 hrs of therapy per day at least 5 days per week? Yes 6. The potential for patient to make measurable gains while on inpatient rehab is excellent 7. Anticipated functional outcomes upon discharge from inpatient rehab are modified independent and supervision  with PT, modified independent and supervision with OT, n/a with SLP. 8. Estimated rehab length of stay to reach the above functional goals is: 7-11 days. 9. Anticipated D/C setting: Home 10. Anticipated post D/C treatments: HH therapy and Home excercise program 11. Overall Rehab/Functional Prognosis: excellent and good  RECOMMENDATIONS: This patient's condition is appropriate for continued rehabilitative care in the following setting: CIR Patient has agreed to participate in recommended program. Yes Note that insurance prior authorization may be required for reimbursement for recommended care.  Comment: Rehab Admissions Coordinator to follow up.  Maryla Morrow, MD, ABPMR Mariam Dollar J., PA-C 11/20/2016

## 2016-11-20 NOTE — Care Management Note (Addendum)
Case Management Note Donn PieriniKristi Cable Fearn RN, BSN Unit 4E-Case Manager [phone]  Patient Details  Name: Elizebeth Brookingoni B Ehrlich MRN: [id number] Date of Birth: 1950/06/17  Subjective/Objective:    Pt admitted s/p TMA                Action/Plan: PTA pt lived at home alone-has needed DME at home-  has been in [facility] recently, per PT eval recommendations for return to SNF- CSW following for possible placement. CIR also consulted for possible admission- per CIR admission RN note pt and her sister would prefer CIR over SNF if insurance approves-  Plan CIR vs SNF pending insurance approval.   Expected Discharge Date:                  Expected Discharge Plan:  Skilled Nursing Facility  In-House Referral:  Clinical Social Work  Discharge planning Services  CM Consult  Post Acute Care Choice:    Choice offered to:     DME Arranged:    DME Agency:     HH Arranged:    HH Agency:     Status of Service:  In process, will continue to follow  If discussed at Long Length of Stay Meetings, dates discussed:    Discharge Disposition:   Additional Comments:  Darrold SpanWebster, Alexandre Faries Hall, RN 11/20/2016, 2:19 PM

## 2016-11-20 NOTE — Progress Notes (Signed)
CSW was consulted for SNF back up. Per CIR patient has been approved for inpatient rehab and will have a bed available for patient tomorrow. CSW signing off as patient no longer has social work needs.   Marrianne MoodAshley Berry Godsey, MSW,  [employer] [phone]

## 2016-11-20 NOTE — Discharge Summary (Signed)
[facility] Discharge Summary   Patient ID:  Andrea [name] MRN: [id number] DOB/AGE: 64-Aug-1952 64 y.o.  Admit date: 11/19/2016 Discharge date: 11/21/2016 Date of Surgery: 11/19/2016 Surgeon: Surgeon(s): Maeola Harmanain, Brandon Christopher, MD  Admission Diagnosis: nonviable tissue  Discharge Diagnoses:  nonviable tissue  Secondary Diagnoses: Past Medical History:  Diagnosis Date  . Arthritis   . Asthma   . Coronary artery disease   . Diabetes mellitus without complication (HCC)   . Dyspnea   . Fibromyalgia   . GERD (gastroesophageal reflux disease)   . High cholesterol   . Hypertension   . 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 PONV (postoperative nausea and vomiting)   . PVD (peripheral vascular disease) (HCC)   . Urinary frequency   . Urinary incontinence     Procedure(s): TRANSMETATARSAL AMPUTATION  Discharged Condition: good  HPI: Andrea [name] is a 66 y.o. female follows up from recent left first and second toe irritation.  2 weeks ago we saw her and she had breakdown of the wound we removed the staples and microdebrider.  She has been placing wet-to-dry dressing she does not have any fever chills or erythema.   Hospital Course:  Andrea [name] is a 66 y.o. female is S/P Left Procedure(s): TRANSMETATARSAL AMPUTATION  Doppler AT signal left Dressing in place minimal drainage Heart RRR Lungs non labored  Assessment/Planning: POD # 1 left transmetarsal  Will have dressing changed tomorrow Will order darco shoe for heel weight  Sutures will be taken out in 3 weeks.  POD# 2  Left transmetatarsal amputation site healing well, stump is viable.  Clean dry dressing applied, patient tolerated well. Doppler signal AT left LE Heart RRR Lungs non labored breathing  Assessment/Plan: PAD s/p transmetatarsal amputation Left foot Darco shoe for heel weight bearing Discharge to CIR today F/U with DR. Cain in 3  weeks       Significant Diagnostic Studies: CBC Lab Results  Component Value Date   WBC 9.2 11/20/2016   HGB 7.6 (L) 11/20/2016   HCT 25.0 (L) 11/20/2016   MCV 90.9 11/20/2016   PLT 255 11/20/2016    BMET    Component Value Date/Time   NA 139 11/20/2016 0216   NA 140 04/14/2016 1431   K 4.4 11/20/2016 0216   CL 106 11/20/2016 0216   CO2 25 11/20/2016 0216   GLUCOSE 102 (H) 11/20/2016 0216   BUN 20 11/20/2016 0216   BUN 19 04/14/2016 1431   CREATININE 0.91 11/20/2016 0216   CALCIUM 8.6 (L) 11/20/2016 0216   GFRNONAA >60 11/20/2016 0216   GFRAA >60 11/20/2016 0216   COAG Lab Results  Component Value Date   INR 1.09 09/10/2016   INR 0.93 07/16/2016   INR 1.10 06/12/2016     Disposition:  Discharge to :Rehab Discharge Instructions    Call MD for:  redness, tenderness, or signs of infection (pain, swelling, bleeding, redness, odor or green/yellow discharge around incision site)   Complete by:  As directed    Call MD for:  severe or increased pain, loss or decreased feeling  in affected limb(s)   Complete by:  As directed    Call MD for:  temperature >100.5   Complete by:  As directed    Discharge instructions   Complete by:  As directed    She may shower as needed.   Resume previous diet   Complete by:  As directed    Dan HumphreysWalker  Complete by:  As directed    Heel weight bearing in black darco shoe     Allergies as of 11/20/2016      Reactions   Naproxen Itching   Tramadol Hcl Itching      Medication List    TAKE these medications   albuterol 108 (90 Base) MCG/ACT inhaler Commonly known as:  PROVENTIL HFA;VENTOLIN HFA Inhale 2 puffs into the lungs every 6 (six) hours as needed for wheezing or shortness of breath.   aspirin EC 81 MG tablet Take 1 tablet (81 mg total) by mouth daily.   atorvastatin 80 MG tablet Commonly known as:  LIPITOR Take 1 tablet (80 mg total) by mouth daily.   clopidogrel 75 MG tablet Commonly known as:  PLAVIX Take 1  tablet (75 mg total) by mouth daily with breakfast.   DULoxetine 60 MG capsule Commonly known as:  CYMBALTA Take 1 capsule (60 mg total) by mouth daily.   Fluticasone-Salmeterol 100-50 MCG/DOSE Aepb Commonly known as:  ADVAIR Inhale 1 puff into the lungs 2 (two) times daily.   furosemide 20 MG tablet Commonly known as:  LASIX Take 1 tablet (20 mg total) by mouth daily.   gabapentin 300 MG capsule Commonly known as:  NEURONTIN Take 300 mg by mouth 3 (three) times daily.   hydrochlorothiazide 25 MG tablet Commonly known as:  HYDRODIURIL Take 25 mg by mouth daily.   HYDROcodone-acetaminophen 5-325 MG tablet Commonly known as:  NORCO/VICODIN Take 1 tablet by mouth every 6 (six) hours as needed for moderate pain.   ibuprofen 200 MG tablet Commonly known as:  ADVIL,MOTRIN Take 400 mg by mouth every 6 (six) hours as needed for mild pain.   Insulin Glargine 100 UNIT/ML Solostar Pen Commonly known as:  LANTUS SOLOSTAR Inject 30 Units into the skin at bedtime.   lisinopril 5 MG tablet Commonly known as:  PRINIVIL,ZESTRIL Take 1 tablet (5 mg total) by mouth daily.   metFORMIN 1000 MG tablet Commonly known as:  GLUCOPHAGE Take 1,000 mg 2 (two) times daily by mouth.   metoprolol tartrate 25 MG tablet Commonly known as:  LOPRESSOR Take 0.5 tablets (12.5 mg total) by mouth 2 (two) times daily.   oxybutynin 5 MG tablet Commonly known as:  DITROPAN TAKE 1 TABLET BY MOUTH 2 TIMES DAILY. What changed:    how much to take  how to take this  when to take this   pantoprazole 20 MG tablet Commonly known as:  PROTONIX Take 1 tablet (20 mg total) by mouth daily.   potassium chloride SA 20 MEQ tablet Commonly known as:  K-DUR,KLOR-CON Take 20 mEq by mouth daily.   pregabalin 100 MG capsule Commonly known as:  LYRICA Take 1 capsule (100 mg total) by mouth 2 (two) times daily.      Verbal and written Discharge instructions given to the patient. Wound care per Discharge  AVS Follow-up Information    Maeola Harmanain, Brandon Christopher, MD Follow up in 3 week(s).   Specialties:  Vascular Surgery, Cardiology Why:  office will call Contact information: [address] [place] [address] [phone]           Signed: Clinton GallantCOLLINS, EMMA [facility] 11/20/2016, 2:00 PM

## 2016-11-20 NOTE — Progress Notes (Signed)
Patient B/P 103/41 in right arm. Notified provider. Patient sitting up in bed, denies feeling dizzy or any discomfort.

## 2016-11-20 NOTE — Progress Notes (Addendum)
[facility] [place]  Subjective  - Doing well over all.   Objective (!) 159/59 (!) 57 98.1 F (36.7 C) (Oral) 12 100%  Intake/Output Summary (Last 24 hours) at 11/20/2016 0730 Last data filed at 11/20/2016 16100437 Gross per 24 hour  Intake 2036.67 ml  Output 650 ml  Net 1386.67 ml    Doppler AT signal left Dressing in place minimal drainage Heart RRR Lungs non labored  Assessment/Planning: POD # 1 left transmetarsal  Will have dressing changed tomorrow Will order darco shoe for heel weight  SNF placement pending social work consult placed  Clinton GallantCOLLINS, EMMA [facility] 11/20/2016 7:30 AM --  Laboratory Lab Results: Recent Labs    11/19/16 1836 11/20/16 0216  WBC 9.2 9.2  HGB 8.1* 7.6*  HCT 26.5* 25.0*  PLT 283 255   BMET Recent Labs    11/19/16 1000 11/19/16 1836 11/20/16 0216  NA 141  --  139  K 4.6  --  4.4  CL 110  --  106  CO2 24  --  25  GLUCOSE 175*  --  102*  BUN 29*  --  20  CREATININE 1.08* 0.97 0.91  CALCIUM 8.8*  --  8.6*    COAG Lab Results  Component Value Date   INR 1.09 09/10/2016   INR 0.93 07/16/2016   INR 1.10 06/12/2016   No results found for: PTT   I have independently interviewed and examined patient and agree with PA assessment and plan above.   Chaselynn Kepple C. Randie Heinzain, MD [facility] [place] Office: [phone] Pager: [phone]

## 2016-11-20 NOTE — Progress Notes (Signed)
I have insurance approval and bed available to admit pt to tomorrow, Saturday. I have notified RN CM and Kara MeadEmma, Pa for Dr. Randie Heinzain of planned admit tomorrow. Patient aware and in agreement. I also called her sister, Simon RheinBronda, to make aware. I will make the arrangements to admit tomorrow. Dr. Ricarda FrameAndy Kirsteins will admit Saturday, and [facility] RN can contact rehab unit at [phone] to give report tomorrow once cleared by Dr, Randie Heinzain for d.c tomorrow. [phone]

## 2016-11-20 NOTE — Evaluation (Signed)
Physical Therapy Evaluation Patient Details Name: Andrea [name] MRN: [id number] DOB: 01-Mar-1950 Today's Date: 11/20/2016   History of Present Illness  Pt is a 66 y.o. 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     Clinical Impression  Pt presents with pain and an overall decrease in functional mobility secondary to above. PTA, pt recently d/c from SNF and lives at home alone; [facility]. Educ on precautions, positioning, and importance of mobility. Pt able to transfer and ambulate in room with RW and close min guard secondary to 2x self-corrected LOB. Feel pt would benefit from SNF-level therapies at d/c to maximize functional mobility and independence. Will continue to follow acutely to address established goals.      Follow Up Recommendations SNF    Equipment Recommendations  None recommended by PT(owns DME)    Recommendations for Other Services OT consult     Precautions / Restrictions Precautions Precautions: Fall Restrictions Weight Bearing Restrictions: Yes LLE Weight Bearing: Non weight bearing Other Position/Activity Restrictions: Darco boot ordered for WB through heel      Mobility  Bed Mobility Overal bed mobility: Needs Assistance Bed Mobility: Supine to Sit     Supine to sit: Min guard;HOB elevated        Transfers Overall transfer level: Needs assistance Equipment used: Rolling walker (2 wheeled) Transfers: Sit to/from Stand Sit to Stand: Min guard         General transfer comment: Able to stand on second attempt with RW and min guard for balance; cues for hand placement on RW  Ambulation/Gait Ambulation/Gait assistance: Min guard   Assistive device: Rolling walker (2 wheeled)   Gait velocity: Decreased Gait velocity interpretation: <1.8 ft/sec,  indicative of risk for recurrent falls General Gait Details: Amb in room with RW and close min guard; cues for hop-to pattern to maintain NWB on LLE secondary to no arrival of darco boot yet. 2x self-correct LOB posteriorly. Intermittent standing rest breaks secondary to BUE fatigue  Stairs            Wheelchair Mobility    Modified Rankin (Stroke Patients Only)       Balance Overall balance assessment: Needs assistance Sitting-balance support: No upper extremity supported;Feet unsupported Sitting balance-Leahy Scale: Good       Standing balance-Leahy Scale: Poor Standing balance comment: Reliant on BUE support                             Pertinent Vitals/Pain Pain Assessment: 0-10 Pain Score: 6  Pain Descriptors / Indicators: Discomfort;Sore Pain Intervention(s): Monitored during session;Repositioned    Home Living Family/patient expects to be discharged to:: Skilled nursing facility Living Arrangements: Alone Available Help at Discharge: Family;Available PRN/intermittently Type of Home: Apartment Home Access: Stairs to enter   Entrance Stairs-Number of Steps: 1 platform Home Layout: Two level;Bed/bath upstairs;1/2 bath on main level Home Equipment: Walker - 4 wheels;Other (comment);Bedside commode;Tub bench Additional Comments: Pt recently d/c from SNF and has been home the past 2 days    Prior Function Level of Independence: Independent with assistive device(s)         Comments: Has been home for 2 days, [facility] and bird bathing for shower     Hand Dominance        Extremity/Trunk Assessment  Upper Extremity Assessment Upper Extremity Assessment: Overall WFL for tasks assessed    Lower Extremity Assessment Lower Extremity Assessment: LLE deficits/detail;RLE deficits/detail RLE Deficits / Details: Grossly 4/5 throughout LLE Deficits / Details: s/p L transmet amputation; L hip/knee grossly 4/5 throughout     Cervical / Trunk Assessment Cervical / Trunk Assessment: Kyphotic  Communication   Communication: HOH  Cognition Arousal/Alertness: Awake/alert Behavior During Therapy: WFL for tasks assessed/performed Overall Cognitive Status: Within Functional Limits for tasks assessed                                        General Comments      Exercises     Assessment/Plan    PT Assessment Patient needs continued PT services  PT Problem List Decreased strength;Decreased activity tolerance;Decreased balance;Decreased mobility;Decreased knowledge of use of DME;Decreased knowledge of precautions;Pain       PT Treatment Interventions DME instruction;Gait training;Stair training;Functional mobility training;Therapeutic activities;Therapeutic exercise;Balance training;Patient/family education    PT Goals (Current goals can be found in the Care Plan section)  Acute Rehab PT Goals Patient Stated Goal: Return to SNF if possible PT Goal Formulation: With patient Time For Goal Achievement: 12/04/16 Potential to Achieve Goals: Good    Frequency Min 2X/week   Barriers to discharge        Co-evaluation               AM-PAC PT "6 Clicks" Daily Activity  Outcome Measure Difficulty turning over in bed (including adjusting bedclothes, sheets and blankets)?: A Little Difficulty moving from lying on back to sitting on the side of the bed? : A Little Difficulty sitting down on and standing up from a chair with arms (e.g., wheelchair, bedside commode, etc,.)?: A Little Help needed moving to and from a bed to chair (including a wheelchair)?: A Little Help needed walking in hospital room?: A Little Help needed climbing 3-5 steps with a railing? : A Lot 6 Click Score: 17    End of Session Equipment Utilized During Treatment: Gait belt Activity Tolerance: Patient tolerated treatment well;Patient limited by fatigue Patient left: in bed;with call bell/phone within reach;Other  (comment)(in bed for IV team) Nurse Communication: Mobility status PT Visit Diagnosis: Difficulty in walking, not elsewhere classified (R26.2);Unsteadiness on feet (R26.81)    Time: [id number] PT Time Calculation (min) (ACUTE ONLY): 27 min   Charges:   PT Evaluation $PT Eval Moderate Complexity: 1 Mod PT Treatments $Therapeutic Activity: 8-22 mins   PT G Codes:       Ina HomesJaclyn Samir Ishaq, PT, DPT Acute Rehab Services  Pager: [phone]  Malachy ChamberJaclyn L Lasean Rahming 11/20/2016, 11:37 AM

## 2016-11-20 NOTE — Evaluation (Signed)
Occupational Therapy Evaluation Patient Details Name: Andrea [name] MRN: [id number] DOB: 1950/08/27 Today's Date: 11/20/2016    History of Present Illness Pt is a 66 y.o. 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    Clinical Impression   Pt is a very motivated to return to independent PLOF. We are still waiting on Darco shoe so that the Pt can put weight through LLE. Pt is currently mod A for LB ADL and min A for stand pivot transfer with RW while maintaining NWB status. Please see OT problem list below. Pt will require skilled OT in the acute setting and will require CIR level therapy to maximize safety and indepenece in ADL and functional transfers. This patient is an excellent candidate due to her motivation, her PLOF, and her ability to withstand intense therapy. She has good family support. Next session to introduce AE for LB bathing/dressing.     Follow Up Recommendations  CIR    Equipment Recommendations  Other (comment)(defer to next venue of care)    Recommendations for Other Services Rehab consult     Precautions / Restrictions Precautions Precautions: Fall Restrictions Weight Bearing Restrictions: Yes LLE Weight Bearing: Non weight bearing Other Position/Activity Restrictions: Darco boot ordered for WB through heel      Mobility Bed Mobility Overal bed mobility: Needs Assistance Bed Mobility: Sit to Supine     Supine to sit: Min guard;HOB elevated Sit to supine: Mod assist   General bed mobility comments: mod A for BLE into bed after transfer from recliner  Transfers Overall transfer level: Needs assistance Equipment used: Rolling walker (2 wheeled) Transfers: Sit to/from Stand Sit to Stand: Min assist         General transfer comment: min A for power up, vc for safe  hand placement with RW    Balance Overall balance assessment: Needs assistance Sitting-balance support: No upper extremity supported;Feet unsupported Sitting balance-Leahy Scale: Good Sitting balance - Comments: sitting briefly EOB with no back support   Standing balance support: Bilateral upper extremity supported;During functional activity Standing balance-Leahy Scale: Poor Standing balance comment: Reliant on BUE support                           ADL either performed or assessed with clinical judgement   ADL Overall ADL's : Needs assistance/impaired Eating/Feeding: Set up;Sitting Eating/Feeding Details (indicate cue type and reason): eating lunch at the end of session Grooming: Set up;Wash/dry hands;Wash/dry face;Sitting Grooming Details (indicate cue type and reason): Pt with full ROM with BUE, generalized weakness does not impact grasp on grooming items Upper Body Bathing: Set up;Sitting   Lower Body Bathing: Moderate assistance   Upper Body Dressing : Set up   Lower Body Dressing: Moderate assistance Lower Body Dressing Details (indicate cue type and reason): required assist for socks, at this time unable to bring foot to knees - will need to explore AE options Toilet Transfer: Min guard;Cueing for safety;Stand-pivot;RW;BSC Toilet Transfer Details (indicate cue type and reason): anticipate better safety when Darco shoe is available Toileting- Clothing Manipulation and Hygiene: Maximal assistance;Sit to/from stand Toileting - Clothing Manipulation Details (indicate cue type and reason): Pt currently requires BUE support in standing due to NWB      Functional mobility during ADLs: Minimal assistance;Rolling walker(stand pivot only) General ADL Comments: decreased balance, decreased strength  and activity tolerance     Vision Patient Visual Report: No change from baseline       Perception     Praxis      Pertinent Vitals/Pain Pain Assessment: Faces Pain  Score: 6  Faces Pain Scale: Hurts little more Pain Location: L foot at operation site Pain Descriptors / Indicators: Discomfort;Sore Pain Intervention(s): Monitored during session;Premedicated before session;Limited activity within patient's tolerance     Hand Dominance Right   Extremity/Trunk Assessment Upper Extremity Assessment Upper Extremity Assessment: Overall WFL for tasks assessed;Generalized weakness(grossly 4-/5 strength bilaterally)   Lower Extremity Assessment Lower Extremity Assessment: Defer to PT evaluation RLE Deficits / Details: Grossly 4/5 throughout LLE Deficits / Details: s/p L transmet amputation; L hip/knee grossly 4/5 throughout   Cervical / Trunk Assessment Cervical / Trunk Assessment: Kyphotic   Communication Communication Communication: HOH   Cognition Arousal/Alertness: Awake/alert Behavior During Therapy: WFL for tasks assessed/performed Overall Cognitive Status: Within Functional Limits for tasks assessed                                     General Comments  [employer] coming into room to [facility] shoe upon OT exit    Exercises     Shoulder Instructions      Home Living Family/patient expects to be discharged to:: Inpatient rehab Living Arrangements: Alone Available Help at Discharge: Family;Available PRN/intermittently Type of Home: Apartment Home Access: Stairs to enter Entrance Stairs-Number of Steps: 1 platform   Home Layout: Two level;Bed/bath upstairs;1/2 bath on main level Alternate Level Stairs-Number of Steps: 13 Alternate Level Stairs-Rails: Right;Left Bathroom Shower/Tub: Tub/shower unit;Curtain   Bathroom Toilet: Standard     Home Equipment: Environmental consultantWalker - 4 wheels;Other (comment);Bedside commode;Tub bench   Additional Comments: Pt recently d/c from SNF and has been home the past 2 days      Prior Functioning/Environment Level of Independence: Independent with assistive device(s)        Comments: Has  been home for 2 days, [facility] and taking sponge baths in sink for shower        OT Problem List: Decreased activity tolerance;Impaired balance (sitting and/or standing);Decreased coordination;Decreased knowledge of use of DME or AE;Decreased knowledge of precautions;Pain      OT Treatment/Interventions: Self-care/ADL training;Therapeutic exercise;Energy conservation;DME and/or AE instruction;Therapeutic activities;Patient/family education;Balance training    OT Goals(Current goals can be found in the care plan section) Acute Rehab OT Goals Patient Stated Goal: get as strong as possible prior to returning home OT Goal Formulation: With patient Time For Goal Achievement: 12/04/16 Potential to Achieve Goals: Good ADL Goals Pt Will Perform Grooming: standing;with modified independence Pt Will Perform Lower Body Bathing: with supervision;with adaptive equipment;sit to/from stand Pt Will Perform Lower Body Dressing: with supervision;sit to/from stand;with adaptive equipment Pt Will Transfer to Toilet: with modified independence;ambulating;regular height toilet Pt Will Perform Toileting - Clothing Manipulation and hygiene: with modified independence;sit to/from stand Additional ADL Goal #1: Pt will perform bed mobility as precursor to participation in ADL at modified independent level  OT Frequency: Min 3X/week   Barriers to D/C:    Pt ha sfamily support, but typically lives alone       Co-evaluation              AM-PAC PT "6 Clicks" Daily Activity     Outcome Measure Help from another person eating meals?: None Help from another person taking care  of personal grooming?: A Little Help from another person toileting, which includes using toliet, bedpan, or urinal?: A Little Help from another person bathing (including washing, rinsing, drying)?: A Lot Help from another person to put on and taking off regular upper body clothing?: None Help from another person to  put on and taking off regular lower body clothing?: A Lot 6 Click Score: 18   End of Session Equipment Utilized During Treatment: Gait belt;Rolling walker Nurse Communication: Mobility status;Precautions;Weight bearing status  Activity Tolerance: Patient tolerated treatment well Patient left: in bed;with call bell/phone within reach;with bed alarm set  OT Visit Diagnosis: Unsteadiness on feet (R26.81);Other abnormalities of gait and mobility (R26.89);Muscle weakness (generalized) (M62.81);Pain Pain - Right/Left: Left Pain - part of body: Ankle and joints of foot                Time: 1201-1222 OT Time Calculation (min): 21 min Charges:  OT General Charges $OT Visit: 1 Visit OT Evaluation $OT Eval Moderate Complexity: 1 Mod G-Codes:     Sherryl Manges OTR/L [phone] Andrea [name] 11/20/2016, 2:30 PM

## 2016-11-21 ENCOUNTER — Inpatient Hospital Stay (HOSPITAL_COMMUNITY)
Admission: RE | Admit: 2016-11-21 | Discharge: 2016-12-05 | DRG: 560 | Disposition: A | Discharge status: Home Health | Payer: BLUE CROSS/BLUE SHIELD | Attending: Physical Medicine & Rehabilitation | Admitting: Physical Medicine & Rehabilitation

## 2016-11-21 ENCOUNTER — Encounter (HOSPITAL_COMMUNITY): Payer: Self-pay | Admitting: *Deleted

## 2016-11-21 DIAGNOSIS — Z87891 Personal history of nicotine dependence: Secondary | ICD-10-CM | POA: Diagnosis not present

## 2016-11-21 DIAGNOSIS — E1152 Type 2 diabetes mellitus with diabetic peripheral angiopathy with gangrene: Secondary | ICD-10-CM | POA: Diagnosis present

## 2016-11-21 DIAGNOSIS — D62 Acute posthemorrhagic anemia: Secondary | ICD-10-CM | POA: Diagnosis present

## 2016-11-21 DIAGNOSIS — E1142 Type 2 diabetes mellitus with diabetic polyneuropathy: Secondary | ICD-10-CM | POA: Diagnosis present

## 2016-11-21 DIAGNOSIS — G546 Phantom limb syndrome with pain: Secondary | ICD-10-CM | POA: Diagnosis not present

## 2016-11-21 DIAGNOSIS — Z7902 Long term (current) use of antithrombotics/antiplatelets: Secondary | ICD-10-CM

## 2016-11-21 DIAGNOSIS — R42 Dizziness and giddiness: Secondary | ICD-10-CM | POA: Diagnosis not present

## 2016-11-21 DIAGNOSIS — E11649 Type 2 diabetes mellitus with hypoglycemia without coma: Secondary | ICD-10-CM | POA: Diagnosis not present

## 2016-11-21 DIAGNOSIS — Z23 Encounter for immunization: Secondary | ICD-10-CM | POA: Diagnosis not present

## 2016-11-21 DIAGNOSIS — M797 Fibromyalgia: Secondary | ICD-10-CM | POA: Diagnosis present

## 2016-11-21 DIAGNOSIS — Z888 Allergy status to other drugs, medicaments and biological substances status: Secondary | ICD-10-CM

## 2016-11-21 DIAGNOSIS — Z89432 Acquired absence of left foot: Secondary | ICD-10-CM | POA: Diagnosis not present

## 2016-11-21 DIAGNOSIS — Z90710 Acquired absence of both cervix and uterus: Secondary | ICD-10-CM | POA: Diagnosis not present

## 2016-11-21 DIAGNOSIS — Z794 Long term (current) use of insulin: Secondary | ICD-10-CM

## 2016-11-21 DIAGNOSIS — E162 Hypoglycemia, unspecified: Secondary | ICD-10-CM | POA: Diagnosis not present

## 2016-11-21 DIAGNOSIS — Z885 Allergy status to narcotic agent status: Secondary | ICD-10-CM | POA: Diagnosis not present

## 2016-11-21 DIAGNOSIS — K219 Gastro-esophageal reflux disease without esophagitis: Secondary | ICD-10-CM

## 2016-11-21 DIAGNOSIS — Z4781 Encounter for orthopedic aftercare following surgical amputation: Secondary | ICD-10-CM | POA: Diagnosis present

## 2016-11-21 DIAGNOSIS — Z716 Tobacco abuse counseling: Secondary | ICD-10-CM

## 2016-11-21 DIAGNOSIS — N179 Acute kidney failure, unspecified: Secondary | ICD-10-CM | POA: Diagnosis not present

## 2016-11-21 DIAGNOSIS — Z951 Presence of aortocoronary bypass graft: Secondary | ICD-10-CM

## 2016-11-21 DIAGNOSIS — I10 Essential (primary) hypertension: Secondary | ICD-10-CM | POA: Diagnosis present

## 2016-11-21 DIAGNOSIS — N3281 Overactive bladder: Secondary | ICD-10-CM

## 2016-11-21 DIAGNOSIS — Z79899 Other long term (current) drug therapy: Secondary | ICD-10-CM | POA: Diagnosis not present

## 2016-11-21 DIAGNOSIS — I2510 Atherosclerotic heart disease of native coronary artery without angina pectoris: Secondary | ICD-10-CM | POA: Diagnosis present

## 2016-11-21 DIAGNOSIS — G8918 Other acute postprocedural pain: Secondary | ICD-10-CM | POA: Diagnosis not present

## 2016-11-21 DIAGNOSIS — Z7982 Long term (current) use of aspirin: Secondary | ICD-10-CM | POA: Diagnosis not present

## 2016-11-21 DIAGNOSIS — R2689 Other abnormalities of gait and mobility: Secondary | ICD-10-CM

## 2016-11-21 DIAGNOSIS — E1149 Type 2 diabetes mellitus with other diabetic neurological complication: Secondary | ICD-10-CM

## 2016-11-21 DIAGNOSIS — E785 Hyperlipidemia, unspecified: Secondary | ICD-10-CM | POA: Diagnosis present

## 2016-11-21 DIAGNOSIS — R7989 Other specified abnormal findings of blood chemistry: Secondary | ICD-10-CM | POA: Diagnosis not present

## 2016-11-21 LAB — GLUCOSE, CAPILLARY
GLUCOSE-CAPILLARY: 124 mg/dL — AB (ref 65–99)
Glucose-Capillary: 96 mg/dL (ref 65–99)
Glucose-Capillary: 184 mg/dL — ABNORMAL HIGH (ref 65–99)

## 2016-11-21 LAB — URINALYSIS, ROUTINE W REFLEX MICROSCOPIC
BILIRUBIN URINE: NEGATIVE
Glucose, UA: NEGATIVE mg/dL
Hgb urine dipstick: NEGATIVE
KETONES UR: NEGATIVE mg/dL
LEUKOCYTES UA: NEGATIVE
NITRITE: NEGATIVE
PH: 5 (ref 5.0–8.0)
Protein, ur: NEGATIVE mg/dL
SPECIFIC GRAVITY, URINE: 1.014 (ref 1.005–1.030)

## 2016-11-21 LAB — BASIC METABOLIC PANEL
ANION GAP: 8 (ref 5–15)
BUN: 17 mg/dL (ref 6–20)
CO2: 27 mmol/L (ref 22–32)
Calcium: 8.6 mg/dL — ABNORMAL LOW (ref 8.9–10.3)
Chloride: 106 mmol/L (ref 101–111)
Creatinine, Ser: 0.83 mg/dL (ref 0.44–1.00)
GFR calc non Af Amer: >60 mL/min (ref >60)
GLUCOSE: 104 mg/dL — AB (ref 65–99)
POTASSIUM: 4.2 mmol/L (ref 3.5–5.1)
Sodium: 141 mmol/L (ref 135–145)

## 2016-11-21 LAB — CBC
HEMATOCRIT: 25.9 % — AB (ref 36.0–46.0)
HEMOGLOBIN: 8 g/dL — AB (ref 12.0–15.0)
MCH: 28.7 pg (ref 26.0–34.0)
MCHC: 30.9 g/dL (ref 30.0–36.0)
MCV: 92.8 fL (ref 78.0–100.0)
Platelets: 279 10*3/uL (ref 150–400)
RBC: 2.79 MIL/uL — AB (ref 3.87–5.11)
RDW: 15.5 % (ref 11.5–15.5)
WBC: 7.7 10*3/uL (ref 4.0–10.5)

## 2016-11-21 MED ORDER — SORBITOL 70 % SOLN
30.0000 mL | Freq: Every day | Status: DC | PRN
  Administered 2016-11-28: 30 mL via ORAL
  Filled 2016-11-21 (×2): qty 30

## 2016-11-21 MED ORDER — HEPARIN SODIUM (PORCINE) 5000 UNIT/ML IJ SOLN
5000.0000 [IU] | Freq: Three times a day (TID) | INTRAMUSCULAR | Status: DC
  Administered 2016-11-21 – 2016-12-05 (×41): 5000 [IU] via SUBCUTANEOUS
  Filled 2016-11-21 (×41): qty 1

## 2016-11-21 MED ORDER — ATORVASTATIN CALCIUM 80 MG PO TABS
80.0000 mg | ORAL_TABLET | Freq: Every day | ORAL | Status: DC
  Administered 2016-11-21 – 2016-12-04 (×14): 80 mg via ORAL
  Filled 2016-11-21 (×14): qty 1

## 2016-11-21 MED ORDER — BISACODYL 5 MG PO TBEC
5.0000 mg | DELAYED_RELEASE_TABLET | Freq: Every day | ORAL | Status: DC | PRN
  Administered 2016-12-04: 5 mg via ORAL
  Filled 2016-11-21: qty 1

## 2016-11-21 MED ORDER — DOCUSATE SODIUM 100 MG PO CAPS
100.0000 mg | ORAL_CAPSULE | Freq: Every day | ORAL | Status: DC
  Administered 2016-11-21 – 2016-12-05 (×15): 100 mg via ORAL
  Filled 2016-11-21 (×14): qty 1

## 2016-11-21 MED ORDER — ALBUTEROL SULFATE (2.5 MG/3ML) 0.083% IN NEBU: 2.5000 mg | INHALATION_SOLUTION | Freq: Four times a day (QID) | RESPIRATORY_TRACT | Status: DC | PRN

## 2016-11-21 MED ORDER — POTASSIUM CHLORIDE CRYS ER 20 MEQ PO TBCR
20.0000 meq | EXTENDED_RELEASE_TABLET | Freq: Every day | ORAL | Status: DC
  Administered 2016-11-22 – 2016-11-25 (×4): 20 meq via ORAL
  Filled 2016-11-21 (×5): qty 1

## 2016-11-21 MED ORDER — GABAPENTIN 300 MG PO CAPS
300.0000 mg | ORAL_CAPSULE | Freq: Three times a day (TID) | ORAL | Status: DC
  Administered 2016-11-21: 300 mg via ORAL
  Filled 2016-11-21: qty 1

## 2016-11-21 MED ORDER — METFORMIN HCL 500 MG PO TABS
1000.0000 mg | ORAL_TABLET | Freq: Two times a day (BID) | ORAL | Status: DC
  Administered 2016-11-21 – 2016-11-25 (×7): 1000 mg via ORAL
  Filled 2016-11-21 (×7): qty 2

## 2016-11-21 MED ORDER — PREGABALIN 50 MG PO CAPS
100.0000 mg | ORAL_CAPSULE | Freq: Two times a day (BID) | ORAL | Status: DC
  Administered 2016-11-21 – 2016-12-02 (×22): 100 mg via ORAL
  Filled 2016-11-21 (×22): qty 2

## 2016-11-21 MED ORDER — ONDANSETRON HCL 4 MG PO TABS: 4.0000 mg | ORAL_TABLET | Freq: Four times a day (QID) | ORAL | Status: DC | PRN

## 2016-11-21 MED ORDER — MOMETASONE FURO-FORMOTEROL FUM 100-5 MCG/ACT IN AERO
2.0000 | INHALATION_SPRAY | Freq: Two times a day (BID) | RESPIRATORY_TRACT | Status: DC
  Administered 2016-11-21 – 2016-12-05 (×24): 2 via RESPIRATORY_TRACT
  Filled 2016-11-21: qty 8.8

## 2016-11-21 MED ORDER — HEPARIN SODIUM (PORCINE) 5000 UNIT/ML IJ SOLN: 5000.0000 [IU] | Freq: Three times a day (TID) | INTRAMUSCULAR | Status: DC

## 2016-11-21 MED ORDER — ALUM & MAG HYDROXIDE-SIMETH 200-200-20 MG/5ML PO SUSP: 15.0000 mL | ORAL | Status: DC | PRN

## 2016-11-21 MED ORDER — PANTOPRAZOLE SODIUM 20 MG PO TBEC
20.0000 mg | DELAYED_RELEASE_TABLET | Freq: Every day | ORAL | Status: DC
  Administered 2016-11-22 – 2016-12-05 (×14): 20 mg via ORAL
  Filled 2016-11-21 (×14): qty 1

## 2016-11-21 MED ORDER — INSULIN ASPART 100 UNIT/ML ~~LOC~~ SOLN
0.0000 [IU] | Freq: Three times a day (TID) | SUBCUTANEOUS | Status: DC
  Administered 2016-11-21: 2 [IU] via SUBCUTANEOUS
  Administered 2016-11-24 – 2016-11-26 (×2): 1 [IU] via SUBCUTANEOUS
  Administered 2016-11-27: 2 [IU] via SUBCUTANEOUS
  Administered 2016-11-28 – 2016-12-02 (×5): 1 [IU] via SUBCUTANEOUS
  Administered 2016-12-03: 2 [IU] via SUBCUTANEOUS
  Administered 2016-12-04 – 2016-12-05 (×2): 1 [IU] via SUBCUTANEOUS

## 2016-11-21 MED ORDER — METOPROLOL TARTRATE 12.5 MG HALF TABLET
12.5000 mg | ORAL_TABLET | Freq: Two times a day (BID) | ORAL | Status: DC
  Administered 2016-11-21 – 2016-12-05 (×23): 12.5 mg via ORAL
  Filled 2016-11-21 (×27): qty 1

## 2016-11-21 MED ORDER — FUROSEMIDE 20 MG PO TABS
20.0000 mg | ORAL_TABLET | Freq: Every day | ORAL | Status: DC
  Administered 2016-11-22 – 2016-11-24 (×3): 20 mg via ORAL
  Filled 2016-11-21 (×3): qty 1

## 2016-11-21 MED ORDER — OXYBUTYNIN CHLORIDE 5 MG PO TABS
5.0000 mg | ORAL_TABLET | Freq: Two times a day (BID) | ORAL | Status: DC
  Administered 2016-11-21 – 2016-12-05 (×28): 5 mg via ORAL
  Filled 2016-11-21 (×28): qty 1

## 2016-11-21 MED ORDER — DULOXETINE HCL 60 MG PO CPEP
60.0000 mg | ORAL_CAPSULE | Freq: Every day | ORAL | Status: DC
  Administered 2016-11-22 – 2016-12-05 (×14): 60 mg via ORAL
  Filled 2016-11-21 (×15): qty 1

## 2016-11-21 MED ORDER — HYDROCODONE-ACETAMINOPHEN 5-325 MG PO TABS
1.0000 | ORAL_TABLET | ORAL | Status: DC | PRN
  Administered 2016-11-22 – 2016-11-25 (×7): 2 via ORAL
  Administered 2016-12-01: 1 via ORAL
  Administered 2016-12-02: 2 via ORAL
  Filled 2016-11-21 (×5): qty 2
  Filled 2016-11-21: qty 1
  Filled 2016-11-21 (×5): qty 2

## 2016-11-21 MED ORDER — PNEUMOCOCCAL VAC POLYVALENT 25 MCG/0.5ML IJ INJ
0.5000 mL | INJECTION | INTRAMUSCULAR | Status: AC
  Administered 2016-11-22: 0.5 mL via INTRAMUSCULAR
  Filled 2016-11-21: qty 0.5

## 2016-11-21 MED ORDER — CLOPIDOGREL BISULFATE 75 MG PO TABS
75.0000 mg | ORAL_TABLET | Freq: Every day | ORAL | Status: DC
  Administered 2016-11-22 – 2016-12-05 (×14): 75 mg via ORAL
  Filled 2016-11-21 (×15): qty 1

## 2016-11-21 MED ORDER — INSULIN GLARGINE 100 UNIT/ML ~~LOC~~ SOLN
30.0000 [IU] | Freq: Every day | SUBCUTANEOUS | Status: DC
  Administered 2016-11-21 – 2016-11-23 (×3): 30 [IU] via SUBCUTANEOUS
  Filled 2016-11-21 (×3): qty 0.3

## 2016-11-21 MED ORDER — ENSURE ENLIVE PO LIQD
237.0000 mL | Freq: Two times a day (BID) | ORAL | Status: DC
  Administered 2016-11-23 – 2016-11-29 (×6): 237 mL via ORAL

## 2016-11-21 MED ORDER — ASPIRIN EC 81 MG PO TBEC
81.0000 mg | DELAYED_RELEASE_TABLET | Freq: Every day | ORAL | Status: DC
  Administered 2016-11-22 – 2016-12-05 (×14): 81 mg via ORAL
  Filled 2016-11-21 (×14): qty 1

## 2016-11-21 MED ORDER — LISINOPRIL 5 MG PO TABS
5.0000 mg | ORAL_TABLET | Freq: Every day | ORAL | Status: DC
  Administered 2016-11-22 – 2016-12-05 (×13): 5 mg via ORAL
  Filled 2016-11-21 (×14): qty 1

## 2016-11-21 MED ORDER — HYDROCHLOROTHIAZIDE 25 MG PO TABS
25.0000 mg | ORAL_TABLET | Freq: Every day | ORAL | Status: DC
  Administered 2016-11-22 – 2016-11-27 (×6): 25 mg via ORAL
  Filled 2016-11-21 (×6): qty 1

## 2016-11-21 MED ORDER — ONDANSETRON HCL 4 MG/2ML IJ SOLN: 4.0000 mg | Freq: Four times a day (QID) | INTRAMUSCULAR | Status: DC | PRN

## 2016-11-21 NOTE — H&P (Signed)
Physical Medicine and Rehabilitation Admission H&P     Chief complaint: Stump pain   HPI: Andrea [name] is a 66 y.o. right handed female diabetes mellitus, fibromyalgia, hypertension, CAD with CABG maintained on aspirin and Plavix, recently stopped smoking 7 months ago, peripheral vascular disease with recent left first and second toe amputations 10/13/2016 was discharged to [facility] and recently had returned home. She lives alone was using a cane prior to admission. She has a sister in the area that works part time. 2 daughters with very little support. 2 level home with one step to entry. Presented 11/19/2016 with surgical wound dehiscence gangrenous changes of left foot. Wound was not felt to be salvageable. Underwent left transmetatarsal amputation 11/19/2016. Hospital course pain management. Acute blood loss anemia 7.6. Subcutaneous heparin for DVT prophylaxis. Physical therapy evaluation completed M.D. has requested physical medicine rehabilitation consult. Patient was admitted for a comprehensive rehabilitation program       Review of Systems  Constitutional: Negative for fever.  HENT: Negative for hearing loss.   Eyes: Negative for blurred vision and double vision.  Respiratory: Positive for cough.        Shortness of breath with exertion  Cardiovascular: Positive for palpitations and leg swelling. Negative for chest pain.  Gastrointestinal: Positive for constipation.       GERD  Genitourinary: Positive for frequency.  Musculoskeletal: Positive for joint pain and myalgias.  Skin: Negative for rash.  Neurological: Negative for seizures.  All other systems reviewed and are negative.       Past Medical History:  Diagnosis Date  . Arthritis    . Asthma    . Coronary artery disease    . Diabetes mellitus without complication ([place])    . Dyspnea    . Fibromyalgia    . GERD (gastroesophageal reflux disease)    . High cholesterol    . Hypertension    . 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 PONV (postoperative nausea and vomiting)    . PVD (peripheral vascular disease) ([place])    . Urinary frequency    . Urinary incontinence           Past Surgical History:  Procedure Laterality Date  . ABDOMINAL HYSTERECTOMY      . COLONOSCOPY      . CORONARY ARTERY BYPASS GRAFT      . FEMORAL BYPASS Left 07/20/2016    Left common femoral endarterectomy  . KNEE SURGERY      . MULTIPLE TOOTH EXTRACTIONS             Family History  Problem Relation Age of Onset  . Stroke Mother      Social History:  reports that she quit smoking about 7 months ago. Her smoking use included cigarettes. She smoked 0.25 packs per day. she has never used smokeless tobacco. She reports that she does not drink alcohol or use drugs. Allergies:      Allergies  Allergen Reactions  . Naproxen Itching  . Tramadol Hcl Itching          Medications Prior to Admission  Medication Sig Dispense Refill  . albuterol (PROVENTIL HFA;VENTOLIN HFA) 108 (90 Base) MCG/ACT inhaler Inhale 2 puffs into the lungs every 6 (six) hours as needed for wheezing or shortness of breath. 54 Inhaler 3  . aspirin EC 81 MG tablet Take 1 tablet (81 mg total) by mouth daily. 90 tablet 3  . atorvastatin (LIPITOR) 80  MG tablet Take 1 tablet (80 mg total) by mouth daily. 90 tablet 3  . clopidogrel (PLAVIX) 75 MG tablet Take 1 tablet (75 mg total) by mouth daily with breakfast. 30 tablet 3  . DULoxetine (CYMBALTA) 60 MG capsule Take 1 capsule (60 mg total) by mouth daily. 60 capsule 1  . Fluticasone-Salmeterol (ADVAIR) 100-50 MCG/DOSE AEPB Inhale 1 puff into the lungs 2 (two) times daily. 180 each 3  . furosemide (LASIX) 20 MG tablet Take 1 tablet (20 mg total) by mouth daily. 30 tablet 1  . gabapentin (NEURONTIN) 300 MG capsule Take 300 mg by mouth 3 (three) times daily.   3  . hydrochlorothiazide (HYDRODIURIL) 25 MG tablet Take 25 mg by mouth daily.   0  . HYDROcodone-acetaminophen (NORCO/VICODIN) 5-325  MG tablet Take 1 tablet by mouth every 6 (six) hours as needed for moderate pain. 20 tablet 0  . ibuprofen (ADVIL,MOTRIN) 200 MG tablet Take 400 mg by mouth every 6 (six) hours as needed for mild pain.       . Insulin Glargine (LANTUS SOLOSTAR) 100 UNIT/ML Solostar Pen Inject 30 Units into the skin at bedtime. 5 pen 5  . lisinopril (PRINIVIL,ZESTRIL) 5 MG tablet Take 1 tablet (5 mg total) by mouth daily. 30 tablet 5  . metFORMIN (GLUCOPHAGE) 1000 MG tablet Take 1,000 mg 2 (two) times daily by mouth.   3  . metoprolol tartrate (LOPRESSOR) 25 MG tablet Take 0.5 tablets (12.5 mg total) by mouth 2 (two) times daily. 90 tablet 3  . oxybutynin (DITROPAN) 5 MG tablet TAKE 1 TABLET BY MOUTH 2 TIMES DAILY. (Patient taking differently: TAKE 5 MG BY MOUTH 2 TIMES DAILY.) 60 tablet 2  . pantoprazole (PROTONIX) 20 MG tablet Take 1 tablet (20 mg total) by mouth daily. 30 tablet 1  . potassium chloride SA (K-DUR,KLOR-CON) 20 MEQ tablet Take 20 mEq by mouth daily.      . pregabalin (LYRICA) 100 MG capsule Take 1 capsule (100 mg total) by mouth 2 (two) times daily. 60 capsule 3      Drug Regimen Review Drug regimen was reviewed and remains appropriate with no significant issues identified   Home: Home Living Family/patient expects to be discharged to:: Skilled nursing facility Living Arrangements: Alone Available Help at Discharge: Family, Available PRN/intermittently Type of Home: Apartment Home Access: Stairs to enter Entrance Stairs-Number of Steps: 1 platform Home Layout: Two level, Bed/bath upstairs, 1/2 bath on main level Alternate Level Stairs-Number of Steps: 13 Alternate Level Stairs-Rails: Right, Left Bathroom Shower/Tub: Tub/shower unit, Architectural technologist: Standard Home Equipment: Environmental consultant - 4 wheels, Other (comment), Bedside commode, Tub bench Additional Comments: Pt recently d/c from SNF and has been home the past 2 days   Functional History: Prior Function Level of Independence:  Independent with assistive device(s) Comments: Has been home for 2 days, reliant on [facility] for ambulation and bird bathing for shower   Functional Status:  Mobility: Bed Mobility Overal bed mobility: Needs Assistance Bed Mobility: Supine to Sit Supine to sit: Min guard, HOB elevated Transfers Overall transfer level: Needs assistance Equipment used: Rolling walker (2 wheeled) Transfers: Sit to/from Stand Sit to Stand: Min guard General transfer comment: Able to stand on second attempt with RW and min guard for balance; cues for hand placement on RW Ambulation/Gait Ambulation/Gait assistance: Min guard Assistive device: Rolling walker (2 wheeled) General Gait Details: Amb in room with RW and close min guard; cues for hop-to pattern to maintain NWB on LLE secondary to no  arrival of darco boot yet. 2x self-correct LOB posteriorly. Intermittent standing rest breaks secondary to BUE fatigue Gait velocity: Decreased Gait velocity interpretation: <1.8 ft/sec, indicative of risk for recurrent falls   ADL:   Cognition: Cognition Overall Cognitive Status: Within Functional Limits for tasks assessed Orientation Level: Oriented X4 Cognition Arousal/Alertness: Awake/alert Behavior During Therapy: WFL for tasks assessed/performed Overall Cognitive Status: Within Functional Limits for tasks assessed   Physical Exam: Blood pressure (!) 159/59, pulse (!) 57, temperature 98.1 F (36.7 C), temperature source Oral, resp. rate 12, height _0  (1.575 m), weight 55.2 kg (121 lb 12.8 oz), SpO2 100 %. Physical Exam  HENT:  Head: Normocephalic.  Eyes: EOM are normal.  Neck: Normal range of motion. Neck supple. No thyromegaly present.  Cardiovascular:  Cardiac rate controlled  Respiratory: Effort normal and breath sounds normal. No respiratory distress.  GI: Soft. Bowel sounds are normal. She exhibits no distension.  Skin. Amputation site is dressed appropriately tender Musculoskeletal: She exhibits  edema and tenderness.  Neurological: She is alert and oriented to person, place, and time.  Motor: B/l UE: 4/5 proximal to distal B/l LE: 4+/5, except for left ADF/PF (pain inhibition  Reduced sensation in toes of Right foot to light touch       Lab Results Last 48 Hours        Results for orders placed or performed during the hospital encounter of 11/19/16 (from the past 48 hour(s))  Glucose, capillary     Status: Abnormal    Collection Time: 11/19/16  9:52 AM  Result Value Ref Range    Glucose-Capillary 171 (H) 65 - 99 mg/dL  CBC     Status: Abnormal    Collection Time: 11/19/16 10:00 AM  Result Value Ref Range    WBC 8.0 4.0 - 10.5 K/uL    RBC 2.98 (L) 3.87 - 5.11 MIL/uL    Hemoglobin 8.4 (L) 12.0 - 15.0 g/dL    HCT 27.7 (L) 36.0 - 46.0 %    MCV 93.0 78.0 - 100.0 fL    MCH 28.2 26.0 - 34.0 pg    MCHC 30.3 30.0 - 36.0 g/dL    RDW 15.5 11.5 - 15.5 %    Platelets 284 150 - 400 K/uL  Basic metabolic panel     Status: Abnormal    Collection Time: 11/19/16 10:00 AM  Result Value Ref Range    Sodium 141 135 - 145 mmol/L    Potassium 4.6 3.5 - 5.1 mmol/L    Chloride 110 101 - 111 mmol/L    CO2 24 22 - 32 mmol/L    Glucose, Bld 175 (H) 65 - 99 mg/dL    BUN 29 (H) 6 - 20 mg/dL    Creatinine, Ser 1.08 (H) 0.44 - 1.00 mg/dL    Calcium 8.8 (L) 8.9 - 10.3 mg/dL    GFR calc non Af Amer 53 (L) >60 mL/min    GFR calc Af Amer >60 >60 mL/min      Comment: (NOTE) The eGFR has been calculated using the CKD EPI equation. This calculation has not been validated in all clinical situations. eGFR's persistently <60 mL/min signify possible Chronic Kidney Disease.      Anion gap 7 5 - 15  Surgical pcr screen     Status: None    Collection Time: 11/19/16 10:09 AM  Result Value Ref Range    MRSA, PCR NEGATIVE NEGATIVE    Staphylococcus aureus NEGATIVE NEGATIVE      Comment: (NOTE)  The Xpert SA Assay (FDA approved for NASAL specimens in patients 18 years of age and older), is one component  of a comprehensive surveillance program. It is not intended to diagnose infection nor to guide or monitor treatment.    Hemoglobin A1c     Status: Abnormal    Collection Time: 11/19/16 10:30 AM  Result Value Ref Range    Hgb A1c MFr Bld 7.3 (H) 4.8 - 5.6 %      Comment: (NOTE) Pre diabetes:          5.7%-6.4% Diabetes:              >6.4% Glycemic control for   <7.0% adults with diabetes      Mean Plasma Glucose 162.81 mg/dL  Glucose, capillary     Status: Abnormal    Collection Time: 11/19/16 11:51 AM  Result Value Ref Range    Glucose-Capillary 125 (H) 65 - 99 mg/dL    Comment 1 Notify RN      Comment 2 Document in Chart    Glucose, capillary     Status: Abnormal    Collection Time: 11/19/16  1:09 PM  Result Value Ref Range    Glucose-Capillary 102 (H) 65 - 99 mg/dL  CBC     Status: Abnormal    Collection Time: 11/19/16  6:36 PM  Result Value Ref Range    WBC 9.2 4.0 - 10.5 K/uL    RBC 2.90 (L) 3.87 - 5.11 MIL/uL    Hemoglobin 8.1 (L) 12.0 - 15.0 g/dL    HCT 26.5 (L) 36.0 - 46.0 %    MCV 91.4 78.0 - 100.0 fL    MCH 27.9 26.0 - 34.0 pg    MCHC 30.6 30.0 - 36.0 g/dL    RDW 15.1 11.5 - 15.5 %    Platelets 283 150 - 400 K/uL  Creatinine, serum     Status: None    Collection Time: 11/19/16  6:36 PM  Result Value Ref Range    Creatinine, Ser 0.97 0.44 - 1.00 mg/dL    GFR calc non Af Amer >60 >60 mL/min    GFR calc Af Amer >60 >60 mL/min      Comment: (NOTE) The eGFR has been calculated using the CKD EPI equation. This calculation has not been validated in all clinical situations. eGFR's persistently <60 mL/min signify possible Chronic Kidney Disease.    Hemoglobin A1c     Status: Abnormal    Collection Time: 11/19/16  6:36 PM  Result Value Ref Range    Hgb A1c MFr Bld 7.2 (H) 4.8 - 5.6 %      Comment: (NOTE) Pre diabetes:          5.7%-6.4% Diabetes:              >6.4% Glycemic control for   <7.0% adults with diabetes      Mean Plasma Glucose 159.94 mg/dL   Glucose, capillary     Status: Abnormal    Collection Time: 11/19/16  9:41 PM  Result Value Ref Range    Glucose-Capillary 230 (H) 65 - 99 mg/dL  Basic metabolic panel     Status: Abnormal    Collection Time: 11/20/16  2:16 AM  Result Value Ref Range    Sodium 139 135 - 145 mmol/L    Potassium 4.4 3.5 - 5.1 mmol/L    Chloride 106 101 - 111 mmol/L    CO2 25 22 - 32 mmol/L    Glucose, Bld 102 (  H) 65 - 99 mg/dL    BUN 20 6 - 20 mg/dL    Creatinine, Ser 0.91 0.44 - 1.00 mg/dL    Calcium 8.6 (L) 8.9 - 10.3 mg/dL    GFR calc non Af Amer >60 >60 mL/min    GFR calc Af Amer >60 >60 mL/min      Comment: (NOTE) The eGFR has been calculated using the CKD EPI equation. This calculation has not been validated in all clinical situations. eGFR's persistently <60 mL/min signify possible Chronic Kidney Disease.      Anion gap 8 5 - 15  CBC     Status: Abnormal    Collection Time: 11/20/16  2:16 AM  Result Value Ref Range    WBC 9.2 4.0 - 10.5 K/uL    RBC 2.75 (L) 3.87 - 5.11 MIL/uL    Hemoglobin 7.6 (L) 12.0 - 15.0 g/dL    HCT 25.0 (L) 36.0 - 46.0 %    MCV 90.9 78.0 - 100.0 fL    MCH 27.6 26.0 - 34.0 pg    MCHC 30.4 30.0 - 36.0 g/dL    RDW 15.0 11.5 - 15.5 %    Platelets 255 150 - 400 K/uL  Glucose, capillary     Status: None    Collection Time: 11/20/16  6:05 AM  Result Value Ref Range    Glucose-Capillary 67 65 - 99 mg/dL  Glucose, capillary     Status: Abnormal    Collection Time: 11/20/16  6:36 AM  Result Value Ref Range    Glucose-Capillary 53 (L) 65 - 99 mg/dL  Glucose, capillary     Status: None    Collection Time: 11/20/16  6:52 AM  Result Value Ref Range    Glucose-Capillary 72 65 - 99 mg/dL  Glucose, capillary     Status: Abnormal    Collection Time: 11/20/16  8:28 AM  Result Value Ref Range    Glucose-Capillary 114 (H) 65 - 99 mg/dL  Glucose, capillary     Status: Abnormal    Collection Time: 11/20/16 11:24 AM  Result Value Ref Range    Glucose-Capillary 120 (H) 65 -  99 mg/dL      Imaging Results (Last 48 hours)  No results found.           Medical Problem List and Plan: 1.  Decreased functional mobility secondary to left transmetatarsal amputation 11/19/2016 after recent left first and second toe amputation with gangrenous changes 2.  DVT Prophylaxis/Anticoagulation: Subcutaneous heparin. Monitor for any bleeding episodes 3. Pain Management: Cymbalta 60 mg daily, Neurontin 300 mg 3 times a day, hydrocodone as needed 4. Mood: Provide emotional support 5. Neuropsych: This patient is capable of making decisions on her own behalf. 6. Skin/Wound Care: Routine skin checks 7. Fluids/Electrolytes/Nutrition: Routine I&O's with follow-up chemistries 8. Acute blood loss anemia. Follow-up CBC 9. Diabetes mellitus peripheral neuropathy hemoglobin A1c 7.3. Lantus insulin 30 units daily at bedtime, Glucophage 1000 mg twice a day.Diabetic teaching 10.Hypertension. HCTZ 25 mg daily, Lasix 20 mg daily, lisinopril 5 mg daily, Lopressor 12.5 mg twice a day 11. CAD with CABG. Continue aspirin and Plavix. No chest pain or shortness of breath 12. Hyperlipidemia. Lipitor 13. Tobacco abuse. Patient recently stopped smoking 7 months ago. Provide counseling   Post Admission Physician Evaluation: 1. Functional deficits secondary  to Left TMA . 2. Patient is admitted to receive collaborative, interdisciplinary care between the physiatrist, rehab nursing staff, and therapy team. 3. Patient's level of medical complexity and substantial  therapy needs in context of that medical necessity cannot be provided at a lesser intensity of care such as a SNF. 4. Patient has experienced substantial functional loss from his/her baseline which was documented above under the "Functional History" and "Functional Status" headings.  Judging by the patient's diagnosis, physical exam, and functional history, the patient has potential for functional progress which will result in measurable gains  while on inpatient rehab.  These gains will be of substantial and practical use upon discharge  in facilitating mobility and self-care at the household level. 5. Physiatrist will provide 24 hour management of medical needs as well as oversight of the therapy plan/treatment and provide guidance as appropriate regarding the interaction of the two. 6. The Preadmission Screening has been reviewed and patient status is unchanged unless otherwise stated above. 7. 24 hour rehab nursing will assist with bladder management, bowel management, safety, skin/wound care, disease management, medication administration, pain management and patient education  and help integrate therapy concepts, techniques,education, etc. 8. PT will assess and treat for/with: pre gait, gait training, endurance , safety, equipment, neuromuscular re education.   Goals are: Supervision. 9. OT will assess and treat for/with: ADLs, Cognitive perceptual skills, Neuromuscular re education, safety, endurance, equipment.   Goals are: Sup. Therapy may with waterproof dressing proceed with showering this patient. 10. SLP will assess and treat for/with: NA.  Goals are: NA. 11. Case Management and Social Worker will assess and treat for psychological issues and discharge planning. 12. Team conference will be held weekly to assess progress toward goals and to determine barriers to discharge. 13. Patient will receive at least 3 hours of therapy per day at least 5 days per week. 14. ELOS: 7-10d       15. Prognosis:  good         Charlett Blake M.D. [facility] FAAPM&R (Sports Med, Neuromuscular Med) Diplomate Am Board of Electrodiagnostic Med  Cathlyn Parsons., PA-C 11/20/2016

## 2016-11-21 NOTE — Progress Notes (Addendum)
     Left transmetatarsal amputation site healing well, stump is viable.  Clean dry dressing applied, patient tolerated well. Doppler signal AT left LE Heart RRR Lungs non labored breathing  Assessment/Plan: PAD s/p transmetatarsal amputation Left foot Darco shoe for heel weight bearing Discharge to CIR today F/U with DR. Cain in 3 weeks  COLLINS, EMMA [name] PA-C  TMA incision looks good Agree with above. Rehab today.  Fabienne Brunsharles Antonyo Hinderer, MD [facility] Office: [phone] Pager: [phone]

## 2016-11-21 NOTE — Progress Notes (Signed)
1503 11/21/16 nursing To Rehab alert and oriented patient accompanied by RN per wheelchair. Oriented to unit set up. Fall prevention sheet explained and signed by patient.

## 2016-11-22 ENCOUNTER — Inpatient Hospital Stay (HOSPITAL_COMMUNITY): Payer: BLUE CROSS/BLUE SHIELD | Admitting: Occupational Therapy

## 2016-11-22 ENCOUNTER — Inpatient Hospital Stay (HOSPITAL_COMMUNITY): Payer: BLUE CROSS/BLUE SHIELD

## 2016-11-22 DIAGNOSIS — E1142 Type 2 diabetes mellitus with diabetic polyneuropathy: Secondary | ICD-10-CM

## 2016-11-22 NOTE — Progress Notes (Signed)
Subjective/Complaints: No new problems. Overnight, patient slept well, no pain complaints. Ready for therapy.  Review of systems denies chest pain, shortness of breath, nausea, vomiting, diarrhea, constipation  Objective: Vital Signs: Blood pressure (!) 139/42, pulse 71, temperature 98.3 F (36.8 C), temperature source Oral, resp. rate 18, height _0  (1.6 m), SpO2 100 %. No results found. Results for orders placed or performed during the hospital encounter of 11/19/16 (from the past 72 hour(s))  Glucose, capillary     Status: Abnormal   Collection Time: 11/19/16 11:51 AM  Result Value Ref Range   Glucose-Capillary 125 (H) 65 - 99 mg/dL   Comment 1 Notify RN    Comment 2 Document in Chart   Glucose, capillary     Status: Abnormal   Collection Time: 11/19/16  1:09 PM  Result Value Ref Range   Glucose-Capillary 102 (H) 65 - 99 mg/dL  CBC     Status: Abnormal   Collection Time: 11/19/16  6:36 PM  Result Value Ref Range   WBC 9.2 4.0 - 10.5 K/uL   RBC 2.90 (L) 3.87 - 5.11 MIL/uL   Hemoglobin 8.1 (L) 12.0 - 15.0 g/dL   HCT 26.5 (L) 36.0 - 46.0 %   MCV 91.4 78.0 - 100.0 fL   MCH 27.9 26.0 - 34.0 pg   MCHC 30.6 30.0 - 36.0 g/dL   RDW 15.1 11.5 - 15.5 %   Platelets 283 150 - 400 K/uL  Creatinine, serum     Status: None   Collection Time: 11/19/16  6:36 PM  Result Value Ref Range   Creatinine, Ser 0.97 0.44 - 1.00 mg/dL   GFR calc non Af Amer >60 >60 mL/min   GFR calc Af Amer >60 >60 mL/min    Comment: (NOTE) The eGFR has been calculated using the CKD EPI equation. This calculation has not been validated in all clinical situations. eGFR's persistently <60 mL/min signify possible Chronic Kidney Disease.   Hemoglobin A1c     Status: Abnormal   Collection Time: 11/19/16  6:36 PM  Result Value Ref Range   Hgb A1c MFr Bld 7.2 (H) 4.8 - 5.6 %    Comment: (NOTE) Pre diabetes:          5.7%-6.4% Diabetes:              >6.4% Glycemic control for   <7.0% adults with diabetes    Mean Plasma Glucose 159.94 mg/dL  Glucose, capillary     Status: Abnormal   Collection Time: 11/19/16  9:41 PM  Result Value Ref Range   Glucose-Capillary 230 (H) 65 - 99 mg/dL  Basic metabolic panel     Status: Abnormal   Collection Time: 11/20/16  2:16 AM  Result Value Ref Range   Sodium 139 135 - 145 mmol/L   Potassium 4.4 3.5 - 5.1 mmol/L   Chloride 106 101 - 111 mmol/L   CO2 25 22 - 32 mmol/L   Glucose, Bld 102 (H) 65 - 99 mg/dL   BUN 20 6 - 20 mg/dL   Creatinine, Ser 0.91 0.44 - 1.00 mg/dL   Calcium 8.6 (L) 8.9 - 10.3 mg/dL   GFR calc non Af Amer >60 >60 mL/min   GFR calc Af Amer >60 >60 mL/min    Comment: (NOTE) The eGFR has been calculated using the CKD EPI equation. This calculation has not been validated in all clinical situations. eGFR's persistently <60 mL/min signify possible Chronic Kidney Disease.    Anion gap 8 5 -  15  CBC     Status: Abnormal   Collection Time: 11/20/16  2:16 AM  Result Value Ref Range   WBC 9.2 4.0 - 10.5 K/uL   RBC 2.75 (L) 3.87 - 5.11 MIL/uL   Hemoglobin 7.6 (L) 12.0 - 15.0 g/dL   HCT 25.0 (L) 36.0 - 46.0 %   MCV 90.9 78.0 - 100.0 fL   MCH 27.6 26.0 - 34.0 pg   MCHC 30.4 30.0 - 36.0 g/dL   RDW 15.0 11.5 - 15.5 %   Platelets 255 150 - 400 K/uL  Glucose, capillary     Status: None   Collection Time: 11/20/16  6:05 AM  Result Value Ref Range   Glucose-Capillary 67 65 - 99 mg/dL  Glucose, capillary     Status: Abnormal   Collection Time: 11/20/16  6:36 AM  Result Value Ref Range   Glucose-Capillary 53 (L) 65 - 99 mg/dL  Glucose, capillary     Status: None   Collection Time: 11/20/16  6:52 AM  Result Value Ref Range   Glucose-Capillary 72 65 - 99 mg/dL  Glucose, capillary     Status: Abnormal   Collection Time: 11/20/16  8:28 AM  Result Value Ref Range   Glucose-Capillary 114 (H) 65 - 99 mg/dL  Glucose, capillary     Status: Abnormal   Collection Time: 11/20/16 11:24 AM  Result Value Ref Range   Glucose-Capillary 120 (H) 65 - 99  mg/dL  Glucose, capillary     Status: Abnormal   Collection Time: 11/20/16  4:22 PM  Result Value Ref Range   Glucose-Capillary 153 (H) 65 - 99 mg/dL  Glucose, capillary     Status: Abnormal   Collection Time: 11/20/16  9:38 PM  Result Value Ref Range   Glucose-Capillary 101 (H) 65 - 99 mg/dL  Basic metabolic panel     Status: Abnormal   Collection Time: 11/21/16  2:46 AM  Result Value Ref Range   Sodium 141 135 - 145 mmol/L   Potassium 4.2 3.5 - 5.1 mmol/L   Chloride 106 101 - 111 mmol/L   CO2 27 22 - 32 mmol/L   Glucose, Bld 104 (H) 65 - 99 mg/dL   BUN 17 6 - 20 mg/dL   Creatinine, Ser 0.83 0.44 - 1.00 mg/dL   Calcium 8.6 (L) 8.9 - 10.3 mg/dL   GFR calc non Af Amer >60 >60 mL/min   GFR calc Af Amer >60 >60 mL/min    Comment: (NOTE) The eGFR has been calculated using the CKD EPI equation. This calculation has not been validated in all clinical situations. eGFR's persistently <60 mL/min signify possible Chronic Kidney Disease.    Anion gap 8 5 - 15  CBC     Status: Abnormal   Collection Time: 11/21/16  2:46 AM  Result Value Ref Range   WBC 7.7 4.0 - 10.5 K/uL   RBC 2.79 (L) 3.87 - 5.11 MIL/uL   Hemoglobin 8.0 (L) 12.0 - 15.0 g/dL   HCT 25.9 (L) 36.0 - 46.0 %   MCV 92.8 78.0 - 100.0 fL   MCH 28.7 26.0 - 34.0 pg   MCHC 30.9 30.0 - 36.0 g/dL   RDW 15.5 11.5 - 15.5 %   Platelets 279 150 - 400 K/uL  Glucose, capillary     Status: None   Collection Time: 11/21/16  6:02 AM  Result Value Ref Range   Glucose-Capillary 96 65 - 99 mg/dL  Glucose, capillary     Status: Abnormal  Collection Time: 11/21/16  7:46 AM  Result Value Ref Range   Glucose-Capillary 124 (H) 65 - 99 mg/dL  Glucose, capillary     Status: Abnormal   Collection Time: 11/21/16 11:33 AM  Result Value Ref Range   Glucose-Capillary 184 (H) 65 - 99 mg/dL  Urinalysis, Routine w reflex microscopic     Status: None   Collection Time: 11/21/16  3:41 PM  Result Value Ref Range   Color, Urine YELLOW YELLOW    APPearance CLEAR CLEAR   Specific Gravity, Urine 1.014 1.005 - 1.030   pH 5.0 5.0 - 8.0   Glucose, UA NEGATIVE NEGATIVE mg/dL   Hgb urine dipstick NEGATIVE NEGATIVE   Bilirubin Urine NEGATIVE NEGATIVE   Ketones, ur NEGATIVE NEGATIVE mg/dL   Protein, ur NEGATIVE NEGATIVE mg/dL   Nitrite NEGATIVE NEGATIVE   Leukocytes, UA NEGATIVE NEGATIVE     HEENT: normal Cardio: RRR and No murmur Resp: CTA B/L and Unlabored GI: BS positive and Nontender, nondistended Extremity:  No Edema Skin:   Other Left TMA wrapped with postoperative dressing and Ace wrap Neuro: Alert/Oriented, Abnormal Sensory Reduced light touch sensation right foot and Abnormal Motor 5/5 bilateral deltoids, biceps, triceps, grip 4/5 in the right hip flexion, knee extensor, 3 minus, ankle dorsal flexor 3 minus to flexion and extension, 4 minus. Left hip flexion, knee extension Musc/Skel:  Extremity tender Tenderness over TMA site as expected Gen. no acute distress   Assessment/Plan: 1. Functional deficits secondary to left TMA, nonweightbearing which require 3+ hours per day of interdisciplinary therapy in a comprehensive inpatient rehab setting. Physiatrist is providing close team supervision and 24 hour management of active medical problems listed below. Physiatrist and rehab team continue to assess barriers to discharge/monitor patient progress toward functional and medical goals. FIM:                   Function - Comprehension Comprehension: Auditory Comprehension assist level: Follows complex conversation/direction with no assist  Function - Expression Expression: Verbal Expression assist level: Expresses complex ideas: With no assist  Function - Social Interaction Social Interaction assist level: Interacts appropriately 90% of the time - Needs monitoring or encouragement for participation or interaction.  Function - Problem Solving Problem solving assist level: Solves complex 90% of the time/cues < 10%  of the time  Function - Memory Memory assist level: Recognizes or recalls 90% of the time/requires cueing < 10% of the time Patient normally able to recall (first 3 days only): Current season, Staff names and faces, That he or she is in a hospital Medical Problem List and Plan: 1.Decreased functional mobilitysecondary to left transmetatarsal amputation 11/19/2016 after recent left first and second toe amputation with gangrenous changes CIR evaluations, PT, OT today 2. DVT Prophylaxis/Anticoagulation: Subcutaneous heparin. Monitor for any bleeding episodes 3. Pain Management:Cymbalta 60 mg daily, Neurontin 300 mg 3 times a day, hydrocodone as needed, pain is adequately controlled at the current time 4. Mood:Provide emotional support 5. Neuropsych: This patientiscapable of making decisions on herown behalf. 6. Skin/Wound Care:Routine skin checks 7. Fluids/Electrolytes/Nutrition:Routine I&O's with follow-up chemistries 8.Acute blood loss anemia. Follow-up CBC, most recent hemoglobin on 11/21/2016 was 8.0, stable compared to prior values. 9.Diabetes mellitus peripheral neuropathy hemoglobin A1c 7.3. 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 Continue aspirin and Plavix. No chest pain or shortness of breath  12.Hyperlipidemia. Lipitor 13.Tobacco abuse. Patient recently stopped smoking 7 months ago. Provide counseling  LOS (Days) 1 A FACE TO FACE EVALUATION WAS PERFORMED  KIRSTEINS,ANDREW E 11/22/2016, 11:24 AM

## 2016-11-22 NOTE — Progress Notes (Signed)
Hypoglycemic Event  CBG: 69  Treatment: meal  Symptoms: per pt sleepy  Follow-up CBG: Time:1740 CBG Result:118  Possible Reasons for Event: unknown  Comments/MD notified:followed hypoglycemic protocol; blood sugar rechecked after dinner    Andrea [name], [name]

## 2016-11-22 NOTE — Evaluation (Signed)
Occupational Therapy Assessment and Plan  Patient Details  Name: Andrea [name] MRN: [id number] Date of Birth: [birth year]  OT Diagnosis: muscle weakness (generalized) Rehab Potential: Rehab Potential (ACUTE ONLY): Good ELOS: 10-14 says   Today's Date: 11/22/2016 OT Individual Time: 0349-1791; 1130-1200 OT Individual Time Calculation (min): 60 min   30 mins  Problem List:  Patient Active Problem List   Diagnosis Date Noted  . Status post transmetatarsal amputation of foot, left ([place]) 11/21/2016  . S/P transmetatarsal amputation of foot, left ([place])   . Acute blood loss anemia   . Diabetes mellitus type 2 in nonobese ([facility])   . Benign essential HTN   . Fibromyalgia   . Postoperative pain   . 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 Atherosclerosis of native arteries of extremities with gangrene, left leg ([place]) 07/20/2016  . GERD (gastroesophageal reflux disease) 07/01/2016  . Adjustment reaction with anxiety and depression 07/01/2016  . Wound infection 06/12/2016  . CAD (coronary artery disease) of artery bypass graft 04/15/2016  . Coronary artery disease involving coronary bypass graft of native heart with angina pectoris ([place])   . Coronary artery disease involving native coronary artery of native heart without angina pectoris 04/07/2016  . PAD (peripheral artery disease) ([place]) 03/30/2016  . Diabetic foot ulcers ([place]) 03/30/2016  . Hyperlipidemia 03/30/2016  . Diabetic neuropathy ([place]) 02/24/2016  . Tobacco abuse 02/24/2016  . Overactive bladder 01/14/2016  . Diabetes mellitus without complication ([place]) 50/56/9794  . Essential hypertension 10/18/2006  . LOW BACK PAIN, CHRONIC 10/18/2006    Past Medical History:  Past Medical History:  Diagnosis Date  . Arthritis   . Asthma   . Coronary artery disease   . Diabetes mellitus without complication ([place])   . Dyspnea   . Fibromyalgia   . GERD (gastroesophageal reflux disease)   . High cholesterol   .  Hypertension   . 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 PONV (postoperative nausea and vomiting)   . PVD (peripheral vascular disease) ([place])   . Urinary frequency   . Urinary incontinence    Past Surgical History:  Past Surgical History:  Procedure Laterality Date  . ABDOMINAL HYSTERECTOMY    . COLONOSCOPY    . CORONARY ARTERY BYPASS GRAFT    . FEMORAL BYPASS Left 07/20/2016   Left common femoral endarterectomy  . KNEE SURGERY    . MULTIPLE TOOTH EXTRACTIONS      Assessment & Plan Clinical Impression: Patient is a 66 y.o. year old female with recent admission to the hospital on 11/19/16 with with surgical wound dehiscence gangrenous changes of left foot. Wound was not felt to be salvageable. Underwent left transmetatarsal amputation 11/19/2016.  Patient transferred to CIR on 11/21/2016 .    Patient currently requires mod with basic self-care skills secondary to muscle weakness, decreased coordination and decreased sitting balance, decreased standing balance, decreased balance strategies and difficulty maintaining precautions.  Prior to hospitalization, patient could complete ADLs with independent .  Patient will benefit from skilled intervention to decrease level of assist with basic self-care skills and increase independence with basic self-care skills prior to discharge home with care partner.  Anticipate patient will require intermittent supervision and follow up home health.  OT - End of Session Activity Tolerance: Tolerates 30+ min activity with multiple rests Endurance Deficit: Yes OT Assessment Rehab Potential (ACUTE ONLY): Good OT Barriers to Discharge: Inaccessible home environment;Decreased caregiver support;Home environment access/layout;Lack of/limited family support OT Barriers to Discharge  Comments: decreased level of IND with ADLs OT Patient demonstrates impairments in the following area(s): Balance;Pain;Safety;Endurance;Motor OT Basic ADL's Functional  Problem(s): Grooming;Bathing;Dressing;Toileting OT Advanced ADL's Functional Problem(s): Simple Meal Preparation OT Transfers Functional Problem(s): Toilet;Tub/Shower OT Plan OT Intensity: Minimum of 1-2 x/day, 45 to 90 minutes OT Frequency: 5 out of 7 days OT Duration/Estimated Length of Stay: 10-14 says OT Treatment/Interventions: Discharge planning;Pain management;Self Care/advanced ADL retraining;Therapeutic Activities;UE/LE Coordination activities;Therapeutic Exercise;Functional mobility training;Patient/family education;Community reintegration;DME/adaptive equipment instruction;UE/LE Strength taining/ROM OT Self Feeding Anticipated Outcome(s): IND OT Basic Self-Care Anticipated Outcome(s): SUP OT Toileting Anticipated Outcome(s): SUP OT Bathroom Transfers Anticipated Outcome(s): SUP OT Recommendation Patient destination: Home Follow Up Recommendations: Home health OT Equipment Recommended: 3 in 1 bedside comode Equipment Details: Pt has cane, two walkers at home   Skilled Therapeutic Intervention Session 1: Chart review completed prior to entering room. Therapist introduced self and pt agreeable to AM ADLs. UEs assessed WFL. Pt completed bed transfer with min A and v/c for hand/foot placement. Thearpist instructed pn on NWB precautions to L LE without Darco boot. Pts boot not arrived yet. Treatment session focused on POC review, ADL/self care training, transfer training, pt education, and safety awareness.Therapist assisted pt into w/c with mod A using walker. Pt needs continual reminders for NWB without Darco. W/c>toilet transfer with min A using grab bars. Noted LOB during clothing management and required mod A. Pt transferred w/c> shower bench with mod A and completed UB/LB washing with min A while sitting. Pt completed UB/LB dressing while in w/c and required mod A for sit<>stand transfer with NWB to L LE for pants up. Pt c/o of fatigue and mild pain. Pt completed grooming at sinkside  with set up A. Pt completed ADLs and left resting in w/c with needs met for next therapy.  Session 2: Pt in new w/c fitted by PT, also Darco boot was delieved and applied. Pt heel WB with darco boot on . Pt agreeable to OT there activities for endurance training, transfer training, and w/c functional mobility. Pt self propelled w/c in room and in hallway with v/c for directions and multiple rest breaks. Pt completed seated there activity at table top level for dexterity and coordination. Functional mob at walker level with min A for ~20 feet with focus on safety awareness and walker function. Pt returned to room via w/c and set up with lunch tray.   OT Evaluation Precautions/Restrictions  Precautions Precautions: Fall Restrictions Weight Bearing Restrictions: Yes LLE Weight Bearing: Non weight bearing Other Position/Activity Restrictions: Darco boot ordered for WB through heel General Chart Reviewed: Yes Additional Pertinent History: diabetes mellitus, fibromyalgia, hypertension, CAD with CABG, peripheral vascular disease with recent left first and second toe amputations 10/13/2016 was discharged to [facility] and recently had returned home.  Returned to ER with  surgical wound dehiscence gangrenous changes of left foot. Wound was not felt to be salvageable. Underwent left transmetatarsal amputation 11/19/2016. Response to Previous Treatment: Patient reporting fatigue but able to participate Family/Caregiver Present: No Vital Signs Therapy Vitals Temp: 98.8 F (37.1 C) Temp Source: Oral Pulse Rate: 68 Resp: 18 BP: (!) 110/38 Patient Position (if appropriate): Lying Oxygen Therapy SpO2: 95 % O2 Device: Not Delivered Pain Pain Assessment Pain Assessment: 0-10 Pain Score: 8  Pain Type: Acute pain Pain Location: Foot Pain Orientation: Left Pain Descriptors / Indicators: Aching Pain Frequency: Occasional Pain Onset: On-going Patients Stated Pain Goal: 0 Pain  Intervention(s): Medication (See eMAR) Home Living/Prior Functioning Home Living Family/patient expects to be discharged  to:: Private residence Available Help at Discharge: Available PRN/intermittently, Family Type of Home: Apartment Home Access: Stairs to enter Technical brewer of Steps: 1 platform Home Layout: Two level, Bed/bath upstairs, 1/2 bath on main level Alternate Level Stairs-Number of Steps: 13 Alternate Level Stairs-Rails: Right, Left Bathroom Shower/Tub: Multimedia programmer: Standard Additional Comments: Pt recently d/c from SNF and went home for 2 days prior to coming back to hospital  Lives With: Alone IADL History Homemaking Responsibilities: Yes Meal Prep Responsibility: Primary Laundry Responsibility: Secondary Cleaning Responsibility: Secondary Bill Paying/Finance Responsibility: Secondary Shopping Responsibility: No Child Care Responsibility: No Homemaking Comments: Pt lives alone and no longer drives. Her sister drives her to dr, store, and helps her ocassionally with food.  Current License: No Mode of Transportation: Family Occupation: Retired Prior Function Level of Independence: Independent with basic ADLs  Able to [place]?: Yes Driving: No Vocation: Retired Comments: Used SPC for ambulation ; has 2 walkers at home  ADL ADL Eating: Independent Where Assessed-Eating: Wheelchair Grooming: Setup Where Assessed-Grooming: Sitting at sink Upper Body Bathing: Supervision/safety, Minimal assistance Where Assessed-Upper Body Bathing: Shower Lower Body Bathing: Moderate assistance Where Assessed-Lower Body Bathing: Shower Upper Body Dressing: Setup Where Assessed-Upper Body Dressing: Wheelchair Lower Body Dressing: Minimal assistance Where Assessed-Lower Body Dressing: Wheelchair Toileting: Moderate assistance Where Assessed-Toileting: Glass blower/designer: Psychiatric nurse Method: Education officer, museum: Bedside commode, Grab bars Tub/Shower Transfer: Minimal Museum/gallery conservator Method: Librarian, academic: Grab bars, Walk in shower, Shower seat with back Social research officer, government: Minimal assistance Social research officer, government Method: Radiographer, therapeutic: Grab bars, Shower seat with back Vision Baseline Vision/History: No visual deficits Patient Visual Report: No change from baseline Vision Assessment?: No apparent visual deficits Perception  Perception: Within Functional Limits Praxis Praxis: Intact Cognition Overall Cognitive Status: Within Functional Limits for tasks assessed Arousal/Alertness: Awake/alert Orientation Level: Person Year: Other (Comment)(1981) Month: September Day of Week: Incorrect Memory: Appears intact Immediate Memory Recall: Sock;Blue;Bed Memory Recall: Bed;Blue Memory Recall Blue: Without Cue Memory Recall Bed: Without Cue Attention: Sustained Sustained Attention: Appears intact Awareness: Appears intact Problem Solving: Appears intact Executive Function: Reasoning;Sequencing;Organizing;Self Monitoring Reasoning: Appears intact Sequencing: Appears intact Organizing: Appears intact Self Monitoring: Appears intact Safety/Judgment: Appears intact Sensation Sensation Light Touch: Appears Intact Stereognosis: Appears Intact Hot/Cold: Appears Intact Proprioception: Appears Intact Additional Comments: No noted sensation deficits of UE  Coordination Gross Motor Movements are Fluid and Coordinated: No Fine Motor Movements are Fluid and Coordinated: No Coordination and Movement Description: coordination limited B LEs secondary to NWB precautions and pain; difficulty with precision but functional Motor  Motor Motor: Within Functional Limits Motor - Skilled Clinical Observations: B UE WFL  Mobility  Bed Mobility Bed Mobility: Supine to Sit Supine to Sit: 4: Min assist Supine to Sit Details: Verbal cues  for technique;Verbal cues for precautions/safety Transfers Transfers: Sit to Stand;Stand to Sit Sit to Stand: 3: Mod assist Sit to Stand Details: Tactile cues for weight beaing;Tactile cues for placement;Verbal cues for technique;Verbal cues for precautions/safety Stand to Sit: 3: Mod assist Stand to Sit Details (indicate cue type and reason): Tactile cues for weight beaing;Tactile cues for placement;Verbal cues for technique;Verbal cues for precautions/safety  Trunk/Postural Assessment  Cervical Assessment Cervical Assessment: Within Functional Limits Thoracic Assessment Thoracic Assessment: Within Functional Limits Lumbar Assessment Lumbar Assessment: Within Functional Limits Postural Control Postural Control: Within Functional Limits  Balance Balance Balance Assessed: Yes Static Sitting Balance Static Sitting - Level of Assistance: 5: Stand  by assistance Dynamic Sitting Balance Dynamic Sitting - Level of Assistance: 5: Stand by assistance Sitting balance - Comments: sitting briefly EOB with no back support  Static Standing Balance Static Standing - Level of Assistance: 4: Min assist Dynamic Standing Balance Dynamic Standing - Level of Assistance: 3: Mod assist Extremity/Trunk Assessment RUE Assessment RUE Assessment: Within Functional Limits LUE Assessment LUE Assessment: Within Functional Limits   See Function Navigator for Current Functional Status.   Refer to Care Plan for Long Term Goals  Recommendations for other services: None    Discharge Criteria: Patient will be discharged from OT if patient refuses treatment 3 consecutive times without medical reason, if treatment goals not met, if there is a change in medical status, if patient makes no progress towards goals or if patient is discharged from hospital.  The above assessment, treatment plan, treatment alternatives and goals were discussed and mutually agreed upon: by patient  Delon [place] 11/22/2016, 3:49  PM

## 2016-11-22 NOTE — Evaluation (Signed)
Physical Therapy Assessment and Plan  Patient Details  Name: Andrea [name] MRN: [id number] Date of Birth: [birth year]  PT Diagnosis: Difficulty walking and Muscle weakness Rehab Potential: Good ELOS: 10-14 days   Today's Date: 11/22/2016 PT Individual Time: 1002-1100 PT Individual Time Calculation (min): 58 min    Problem List:  Patient Active Problem List   Diagnosis Date Noted  . Status post transmetatarsal amputation of foot, left ([place]) 11/21/2016  . S/P transmetatarsal amputation of foot, left ([place])   . Acute blood loss anemia   . Diabetes mellitus type 2 in nonobese ([facility])   . Benign essential HTN   . Fibromyalgia   . Postoperative pain   . Infected prosthetic vascular graft ([place]) 09/10/2016  . Atherosclerosis of native arteries of extremities with gangrene, left leg ([place]) 07/20/2016  . GERD (gastroesophageal reflux disease) 07/01/2016  . Adjustment reaction with anxiety and depression 07/01/2016  . Wound infection 06/12/2016  . CAD (coronary artery disease) of artery bypass graft 04/15/2016  . Coronary artery disease involving coronary bypass graft of native heart with angina pectoris ([place])   . Coronary artery disease involving native coronary artery of native heart without angina pectoris 04/07/2016  . PAD (peripheral artery disease) ([place]) 03/30/2016  . Diabetic foot ulcers ([place]) 03/30/2016  . Hyperlipidemia 03/30/2016  . Diabetic neuropathy ([place]) 02/24/2016  . Tobacco abuse 02/24/2016  . Overactive bladder 01/14/2016  . Diabetes mellitus without complication ([place]) 62/69/4854  . Essential hypertension 10/18/2006  . LOW BACK PAIN, CHRONIC 10/18/2006    Past Medical History:  Past Medical History:  Diagnosis Date  . Arthritis   . Asthma   . Coronary artery disease   . Diabetes mellitus without complication ([place])   . Dyspnea   . Fibromyalgia   . GERD (gastroesophageal reflux disease)   . High cholesterol   . Hypertension   . Infection    nonviable tissue  toes on left foot  . PONV (postoperative nausea and vomiting)   . PVD (peripheral vascular disease) ([place])   . Urinary frequency   . Urinary incontinence    Past Surgical History:  Past Surgical History:  Procedure Laterality Date  . ABDOMINAL HYSTERECTOMY    . COLONOSCOPY    . CORONARY ARTERY BYPASS GRAFT    . FEMORAL BYPASS Left 07/20/2016   Left common femoral endarterectomy  . KNEE SURGERY    . MULTIPLE TOOTH EXTRACTIONS      Assessment & Plan Clinical Impression: Patient is a 66 y.o. year old female with history of diabetes mellitus, fibromyalgia, hypertension, CAD with CABG maintained on aspirin and Plavix, recently stopped smoking 7 months ago, peripheral vascular disease with recent left first and second toe amputations 10/13/2016 was discharged to [facility] and recently had returned home. She lives alone was using a cane prior to admission. She has a sister in the area that works part time. 2 daughters with very little support. 2 level home with one step to entry. Presented 11/19/2016 with surgical wound dehiscence gangrenous changes of left foot. Wound was not felt to be salvageable. Underwent left transmetatarsal amputation 11/19/2016. Patient transferred to [facility] on 11/21/2016 .   Patient currently requires min with mobility secondary to muscle weakness, decreased cardiorespiratoy endurance and decreased standing balance, decreased balance strategies and difficulty maintaining precautions.  Prior to hospitalization, patient was modified independent  with mobility and lived with Alone in a [place] home.  Home access is 1 platformStairs to enter.  Patient will benefit from skilled PT intervention to  maximize safe functional mobility, minimize fall risk and decrease caregiver burden for planned discharge home with intermittent assist.  Anticipate patient will benefit from follow up [facility] at discharge.  PT - End of Session Activity Tolerance: Tolerates 10 - 20 min activity  with multiple rests Endurance Deficit: Yes PT Assessment Rehab Potential (ACUTE/IP ONLY): Good PT Barriers to Discharge: Home environment access/layout PT Barriers to Discharge Comments: 2 story home with 13 steps, pt reports she had been sleeping on the couch downstairs.  PT Patient demonstrates impairments in the following area(s): Balance;Edema;Endurance;Pain;Nutrition;Sensory;Skin Integrity;Motor PT Transfers Functional Problem(s): Bed Mobility;Bed to Chair;Car;Furniture;Floor PT Locomotion Functional Problem(s): Ambulation;Wheelchair Mobility;Stairs PT Plan PT Intensity: Minimum of 1-2 x/day ,45 to 90 minutes PT Frequency: 5 out of 7 days PT Duration Estimated Length of Stay: 10-14 days PT Treatment/Interventions: Ambulation/gait training;Discharge planning;Functional mobility training;Therapeutic Activities;Balance/vestibular training;Disease management/prevention;Neuromuscular re-education;Therapeutic Exercise;Skin care/wound management;Wheelchair propulsion/positioning;DME/adaptive equipment instruction;Pain management;Splinting/orthotics;UE/LE Strength taining/ROM;Patient/family education;Stair training;UE/LE Coordination activities PT Transfers Anticipated Outcome(s): Mod I PT Locomotion Anticipated Outcome(s): Mod I  PT Recommendation Follow Up Recommendations: Home health PT Patient destination: Home Equipment Recommended: To be determined  Skilled Therapeutic Intervention Evaluation completed (see details above and below) with education on PT POC and goals and individual treatment initiated with focus on functional mobility and education on precautions. Pt seated in w/c upon PT arrival, agreeable to therapy tx and denies pain. Pt has still not yet received DARCO shoe for weightbearing through L LE, so pt maintained NWB precautions throughout session. Pt propelled w/c x 150 ft with min assist for steering and verbal cues for technique, using B UEs. Pt performed stand pivot transfers  with RW and squat pivot transfers with min assist, maintaining L LE NWB. Pt performed car transfer with min assist, stand pivot using RW. Pt ambulated x 10 ft using RW and mod assist,hopping technique on R LE in order to maintain L LE NWB, increased posterior LOB. Stairs were not attempted this session secondary to NWB L LE until pt receives [facility] shoe. Pt left seated in w/c at end of session with needs in reach.   PT Evaluation Precautions/Restrictions Precautions Precautions: Fall Restrictions Weight Bearing Restrictions: Yes LLE Weight Bearing: Non weight bearing Other Position/Activity Restrictions: Darco boot ordered for WB through heel General   Vital Signs Pain   Pt denies pain this session Home Living/Prior Functioning Home Living Available Help at Discharge: Available PRN/intermittently;Family Type of Home: Apartment Home Access: Stairs to enter Technical brewer of Steps: 1 platform Home Layout: Two level;Bed/bath upstairs;1/2 bath on main level Alternate Level Stairs-Number of Steps: 13 Alternate Level Stairs-Rails: Right;Left Additional Comments: Pt recently d/c from SNF and went home for 2 days prior to coming back to hospital  Lives With: Alone Prior Function  Able to Take Stairs?: Yes Driving: Yes(now expired) Vocation: Retired Comments: Used SPC for ambulation  Cognition Overall Cognitive Status: Within Advertising copywriter for tasks assessed Orientation Level: Oriented X4 Attention: Sustained Sustained Attention: Appears intact Memory: Appears intact Awareness: Appears intact Problem Solving: Appears intact Safety/Judgment: Appears intact Sensation Sensation Light Touch: Appears Intact Proprioception: Appears Intact Additional Comments: Sensation intact B LEs except for decreased sensation around incision on L foot Coordination Gross Motor Movements are Fluid and Coordinated: No Fine Motor Movements are Fluid and Coordinated: No Coordination and  Movement Description: coordination limited B LEs secondary to NWB precautions and pain Motor  Motor Motor: Within Functional Limits  Trunk/Postural Assessment  Cervical Assessment Cervical Assessment: Exceptions to WFL(cervical protraction) Thoracic Assessment Thoracic Assessment: Exceptions to  WFL(kyphotic) Lumbar Assessment Lumbar Assessment: Within Functional Limits Postural Control Postural Control: Within Functional Limits  Balance Balance Balance Assessed: Yes Static Sitting Balance Static Sitting - Level of Assistance: 5: Stand by assistance Dynamic Sitting Balance Dynamic Sitting - Level of Assistance: 5: Stand by assistance Static Standing Balance Static Standing - Level of Assistance: 4: Min assist Dynamic Standing Balance Dynamic Standing - Level of Assistance: 3: Mod assist Extremity Assessment  RLE Assessment RLE Assessment: Within Functional Limits(grossly 4+/5 throughout) LLE Assessment LLE Assessment: Within Functional Limits(grossly 4/5 throughout, ankle weakness secondary to transmet amputation)   See Function Navigator for Current Functional Status.   Refer to Care Plan for Long Term Goals  Recommendations for other services: Therapeutic Recreation  Outing/community reintegration  Discharge Criteria: Patient will be discharged from PT if patient refuses treatment 3 consecutive times without medical reason, if treatment goals not met, if there is a change in medical status, if patient makes no progress towards goals or if patient is discharged from hospital.  The above assessment, treatment plan, treatment alternatives and goals were discussed and mutually agreed upon: by patient  Netta Corrigan, PT, DPT 11/22/2016, 11:02 AM

## 2016-11-22 NOTE — Progress Notes (Signed)
Orthopedic Tech Progress Note Patient Details:  Elizebeth Brookingoni B Murcia Sep 07, 1950 [id number]  Ortho Devices Type of Ortho Device: Darco shoe Ortho Device/Splint Location: lle Ortho Device/Splint Interventions: Application   Nikki DomCrawford, Sherre Wooton 11/22/2016, 11:08 AM Viewed order from RN order list

## 2016-11-23 ENCOUNTER — Inpatient Hospital Stay (HOSPITAL_COMMUNITY): Payer: BLUE CROSS/BLUE SHIELD | Admitting: Occupational Therapy

## 2016-11-23 ENCOUNTER — Inpatient Hospital Stay (HOSPITAL_COMMUNITY): Payer: BLUE CROSS/BLUE SHIELD

## 2016-11-23 LAB — CBC WITH DIFFERENTIAL/PLATELET
BASOS ABS: 0 10*3/uL (ref 0.0–0.1)
Basophils Relative: 0 %
Eosinophils Absolute: 0.3 10*3/uL (ref 0.0–0.7)
Eosinophils Relative: 4 %
HEMATOCRIT: 28.2 % — AB (ref 36.0–46.0)
HEMOGLOBIN: 8.5 g/dL — AB (ref 12.0–15.0)
LYMPHS PCT: 26 %
Lymphs Abs: 2.1 10*3/uL (ref 0.7–4.0)
MCH: 27.7 pg (ref 26.0–34.0)
MCHC: 30.1 g/dL (ref 30.0–36.0)
MCV: 91.9 fL (ref 78.0–100.0)
MONO ABS: 0.3 10*3/uL (ref 0.1–1.0)
Monocytes Relative: 4 %
NEUTROS ABS: 5.3 10*3/uL (ref 1.7–7.7)
Neutrophils Relative %: 66 %
Platelets: 293 10*3/uL (ref 150–400)
RBC: 3.07 MIL/uL — AB (ref 3.87–5.11)
RDW: 15.2 % (ref 11.5–15.5)
WBC: 8 10*3/uL (ref 4.0–10.5)

## 2016-11-23 LAB — GLUCOSE, CAPILLARY
GLUCOSE-CAPILLARY: 95 mg/dL (ref 65–99)
GLUCOSE-CAPILLARY: 118 mg/dL — AB (ref 65–99)
GLUCOSE-CAPILLARY: 68 mg/dL (ref 65–99)
GLUCOSE-CAPILLARY: 100 mg/dL — AB (ref 65–99)
Glucose-Capillary: 189 mg/dL — ABNORMAL HIGH (ref 65–99)
Glucose-Capillary: 158 mg/dL — ABNORMAL HIGH (ref 65–99)
Glucose-Capillary: 98 mg/dL (ref 65–99)
Glucose-Capillary: 69 mg/dL (ref 65–99)
Glucose-Capillary: 107 mg/dL — ABNORMAL HIGH (ref 65–99)

## 2016-11-23 LAB — COMPREHENSIVE METABOLIC PANEL
ALK PHOS: 62 U/L (ref 38–126)
ALT: 12 U/L — AB (ref 14–54)
AST: 13 U/L — AB (ref 15–41)
Albumin: 2.7 g/dL — ABNORMAL LOW (ref 3.5–5.0)
Anion gap: 9 (ref 5–15)
BILIRUBIN TOTAL: 0.7 mg/dL (ref 0.3–1.2)
BUN: 31 mg/dL — AB (ref 6–20)
CO2: 28 mmol/L (ref 22–32)
CREATININE: 1 mg/dL (ref 0.44–1.00)
Calcium: 8.7 mg/dL — ABNORMAL LOW (ref 8.9–10.3)
Chloride: 100 mmol/L — ABNORMAL LOW (ref 101–111)
GFR calc Af Amer: >60 mL/min (ref >60)
GFR, EST NON AFRICAN AMERICAN: 59 mL/min — AB (ref >60)
Glucose, Bld: 181 mg/dL — ABNORMAL HIGH (ref 65–99)
Potassium: 5 mmol/L (ref 3.5–5.1)
Sodium: 137 mmol/L (ref 135–145)
TOTAL PROTEIN: 6.8 g/dL (ref 6.5–8.1)

## 2016-11-23 LAB — URINE CULTURE: CULTURE: NO GROWTH

## 2016-11-23 NOTE — Progress Notes (Signed)
Occupational Therapy Session Note  Patient Details  Name: Andrea [name] MRN: [id number] Date of Birth: [birth year]  Today's Date: 11/23/2016 OT Individual Time: 2072-1828 OT Individual Time Calculation (min): 44 min    Short Term Goals: Week 1:  OT Short Term Goal 1 (Week 1): Pt will completed toilet transfer with min A  OT Short Term Goal 2 (Week 1): Pt will completed LB dressing with CGA  OT Short Term Goal 3 (Week 1): Pt will completed grooming tasks at sink while standing with minA  OT Short Term Goal 4 (Week 1): Pt will complete shower transfers with min A   Skilled Therapeutic Interventions/Progress Updates:    Treatment session focused on ADLs/self care training, transfer training, activity tolerance/endurance training, safety awareness education. Pt receives from nursing on toilet. Pt performed all toileting task with CGA and v/c for safety. Pt required min A for toilet transfer and v/c for hand/foot placement. Noted impulsivity with transfers. Pt completed static standing balance task at table top level for ~2 minutes x 2 with rest breaks. Pt noted to have uneven weight bearing in B LE d/t discrepancy of height d/t darco boot. Therapist recommend pt wear a shoe to R LE. Pt completed sit<>stand transfer training with walker with instructions for hand/foot placement with CGA-min A. Noted LOB 1x. Continuing to educate on safety awareness during tx. Pt requested toilet d/t loose stool. Pt completed toileting ask as stated above and returned to sink at w/c level to wash hands with S. Pt positioned in room with call bell within reach and needs met.   Therapy Documentation Precautions:  Precautions Precautions: Fall Restrictions Weight Bearing Restrictions: Yes LLE Weight Bearing: Non weight bearing Other Position/Activity Restrictions: Darco shoe for right foot with weightbearing.   Vital Signs: Therapy Vitals Temp: 98.1 F (36.7 C) Temp Source: Oral Pulse Rate: 71 BP: (!)  106/34 Patient Position (if appropriate): Sitting Oxygen Therapy SpO2: 99 % O2 Device: Not Delivered Pain: Pain Assessment Pain Assessment: 0-10 Pain Score: 8  Pain Type: Acute pain Pain Location: Ankle Pain Orientation: Left Pain Descriptors / Indicators: Aching Pain Frequency: Occasional Pain Onset: With Activity Pain Intervention(s): Medication (See eMAR);Repositioned ADL: ADL Eating: Independent Where Assessed-Eating: Wheelchair Grooming: Setup Where Assessed-Grooming: Sitting at sink Upper Body Bathing: Supervision/safety, Minimal assistance Where Assessed-Upper Body Bathing: Shower Lower Body Bathing: Moderate assistance Where Assessed-Lower Body Bathing: Shower Upper Body Dressing: Setup Where Assessed-Upper Body Dressing: Wheelchair Lower Body Dressing: Minimal assistance Where Assessed-Lower Body Dressing: Wheelchair Toileting: Moderate assistance Where Assessed-Toileting: Glass blower/designer: Psychiatric nurse Method: Arts development officer: Bedside commode, Grab bars Tub/Shower Transfer: Minimal Museum/gallery conservator Method: Stand pivot Tub/Shower Equipment: Grab bars, Walk in shower, Shower seat with back Social research officer, government: Minimal assistance Social research officer, government Method: Radiographer, therapeutic: Grab bars, Shower seat with back  See Function Navigator for Current Functional Status.   Therapy/Group: Individual Therapy  Delon [place] 11/23/2016, 3:27 PM

## 2016-11-23 NOTE — Progress Notes (Signed)
Inpatient Diabetes Program Recommendations  AACE/ADA: New Consensus Statement on Inpatient Glycemic Control (2015)  Target Ranges:  Prepandial:   less than 140 mg/dL      Peak postprandial:   less than 180 mg/dL (1-2 hours)      Critically ill patients:  140 - 180 mg/dL   Lab Results  Component Value Date   GLUCAP 100 (H) 11/23/2016   HGBA1C 7.2 (H) 11/19/2016    Review of Glycemic Control  Results for Andrea BrookingSAINTSING, Cherrish B (MRN [id number]) as of 11/23/2016 11:23  Ref. Range 11/22/2016 16:24 11/22/2016 16:47 11/22/2016 17:28 11/22/2016 21:34 11/23/2016 06:23  Glucose-Capillary Latest Ref Range: 65 - 99 mg/dL 69 68 829118 (H) 562107 (H) 130100 (H)    Diabetes history: Type 2 Outpatient Diabetes medications: Lantus 30 units qhs, glucophage 1000mg  bid,   Current orders for Inpatient glycemic control: Lantus 30 units qday, Novolog 0-9 units tid, Metformin 1000mg  bid  Inpatient Diabetes Program Recommendations: This patient has had 2 episodes of hypoglycemia while in hospital. Consider decreasing Lantus to 26 units qhs. Continue Novolog and Metformin as ordered.   Susette RacerJulie Semira Stoltzfus, RN, BA, MHA, CDE Diabetes Coordinator Inpatient Diabetes Program  [phone] (Team Pager) [phone] [facility] Office) 11/23/2016 11:29 AM

## 2016-11-23 NOTE — Progress Notes (Signed)
Hypoglycemic Event  CBG:69  Treatment: 15 GM carbohydrate snack  Symptoms: None  Follow-up CBG: Time:2159 CBG Result:71  Possible Reasons for Event: Unknown  Comments/MD notified:will continue to monitor     Andrea BannisterLisa  [place] [facility]

## 2016-11-23 NOTE — Progress Notes (Signed)
Occupational Therapy Session Note  Patient Details  Name: Andrea [name] MRN: [id number] Date of Birth: Nov 04, 1950  Today's Date: 11/23/2016 OT Individual Time: 1102-1200 OT Individual Time Calculation (min): 58 min    Short Term Goals: Week 1:  OT Short Term Goal 1 (Week 1): Pt will completed toilet transfer with min A  OT Short Term Goal 2 (Week 1): Pt will completed LB dressing with CGA  OT Short Term Goal 3 (Week 1): Pt will completed grooming tasks at sink while standing with minA  OT Short Term Goal 4 (Week 1): Pt will complete shower transfers with min A   Skilled Therapeutic Interventions/Progress Updates:    Pt completed bathing and dressing sit to stand to start session.  Max assist initially to donn Darco shoe on the left foot at the EOB.  She was then able to use the RW for support to ambulate a few feet over to the wheelchair at the sink.  Supervision for all UB bathing and dressing.  Min assist for LB including donning Darko from the wheelchair by crossing RLE over the left knee.  Min steady assist for sit to stand when washing peri area and for pulling pants over hips, increased posterior lean in standing.  Once ADL was completed, had pt propel her wheelchair down to the dayroom for next part of session.  Had pt work on standing balance at the high/low table while engaged in playing checkers.  Min guard assist for balance with pt alternating UEs to set up board and play game.  Min instructional cueing to keep weight on the left heel only as well as for hand positioning to push up and reach back for the chair instead of keeping hands on the table or walker.  Returned to room with occasional min assist for steering wheelchair with pt requesting transfer back to bed.  Min assist for stand pivot transfer to the bed.  She was able to transition to supine with supervision.  Call button and phone in reach with NT in room as well.    Therapy Documentation Precautions:   Precautions Precautions: Fall Restrictions Weight Bearing Restrictions: Yes LLE Weight Bearing: Non weight bearing Other Position/Activity Restrictions: Darco shoe for right foot with weightbearing.  Pain: Pain Assessment Pain Assessment: No/denies pain Pain Score: 8  Pain Type: Acute pain Pain Location: Foot Pain Orientation: Left Pain Descriptors / Indicators: Stabbing Pain Frequency: Intermittent Pain Intervention(s): Medication (See eMAR) ADL: See Function Navigator for Current Functional Status.   Therapy/Group: Individual Therapy  Zamoria Boss OTR/L 11/23/2016, 12:19 PM

## 2016-11-23 NOTE — Progress Notes (Signed)
Subjective/Complaints: Patient slept okay.  Has not had any therapy yet today.  No pain complaints.  Bowels and bladder are doing well  Review of systems denies chest pain, shortness of breath, nausea, vomiting, diarrhea, constipation  Objective: Vital Signs: Blood pressure (!) 119/47, pulse 70, temperature 98.9 F (37.2 C), temperature source Oral, resp. rate 18, height 5' 3"  (1.6 m), SpO2 98 %. No results found. Results for orders placed or performed during the hospital encounter of 11/19/16 (from the past 72 hour(s))  Glucose, capillary     Status: Abnormal   Collection Time: 11/20/16 11:24 AM  Result Value Ref Range   Glucose-Capillary 120 (H) 65 - 99 mg/dL  Glucose, capillary     Status: Abnormal   Collection Time: 11/20/16  4:22 PM  Result Value Ref Range   Glucose-Capillary 153 (H) 65 - 99 mg/dL  Glucose, capillary     Status: Abnormal   Collection Time: 11/20/16  9:38 PM  Result Value Ref Range   Glucose-Capillary 101 (H) 65 - 99 mg/dL  Basic metabolic panel     Status: Abnormal   Collection Time: 11/21/16  2:46 AM  Result Value Ref Range   Sodium 141 135 - 145 mmol/L   Potassium 4.2 3.5 - 5.1 mmol/L   Chloride 106 101 - 111 mmol/L   CO2 27 22 - 32 mmol/L   Glucose, Bld 104 (H) 65 - 99 mg/dL   BUN 17 6 - 20 mg/dL   Creatinine, Ser 0.83 0.44 - 1.00 mg/dL   Calcium 8.6 (L) 8.9 - 10.3 mg/dL   GFR calc non Af Amer >60 >60 mL/min   GFR calc Af Amer >60 >60 mL/min    Comment: (NOTE) The eGFR has been calculated using the CKD EPI equation. This calculation has not been validated in all clinical situations. eGFR's persistently <60 mL/min signify possible Chronic Kidney Disease.    Anion gap 8 5 - 15  CBC     Status: Abnormal   Collection Time: 11/21/16  2:46 AM  Result Value Ref Range   WBC 7.7 4.0 - 10.5 K/uL   RBC 2.79 (L) 3.87 - 5.11 MIL/uL   Hemoglobin 8.0 (L) 12.0 - 15.0 g/dL   HCT 25.9 (L) 36.0 - 46.0 %   MCV 92.8 78.0 - 100.0 fL   MCH 28.7 26.0 - 34.0 pg   MCHC 30.9 30.0 - 36.0 g/dL   RDW 15.5 11.5 - 15.5 %   Platelets 279 150 - 400 K/uL  Glucose, capillary     Status: None   Collection Time: 11/21/16  6:02 AM  Result Value Ref Range   Glucose-Capillary 96 65 - 99 mg/dL  Glucose, capillary     Status: Abnormal   Collection Time: 11/21/16  7:46 AM  Result Value Ref Range   Glucose-Capillary 124 (H) 65 - 99 mg/dL  Glucose, capillary     Status: Abnormal   Collection Time: 11/21/16 11:33 AM  Result Value Ref Range   Glucose-Capillary 184 (H) 65 - 99 mg/dL  Urinalysis, Routine w reflex microscopic     Status: None   Collection Time: 11/21/16  3:41 PM  Result Value Ref Range   Color, Urine YELLOW YELLOW   APPearance CLEAR CLEAR   Specific Gravity, Urine 1.014 1.005 - 1.030   pH 5.0 5.0 - 8.0   Glucose, UA NEGATIVE NEGATIVE mg/dL   Hgb urine dipstick NEGATIVE NEGATIVE   Bilirubin Urine NEGATIVE NEGATIVE   Ketones, ur NEGATIVE NEGATIVE mg/dL   Protein,  ur NEGATIVE NEGATIVE mg/dL   Nitrite NEGATIVE NEGATIVE   Leukocytes, UA NEGATIVE NEGATIVE     HEENT: normal Cardio: RRR and No murmur Resp: CTA B/L and Unlabored GI: BS positive and Nontender, nondistended Extremity:  No Edema Skin:   Other Left TMA wrapped with postoperative dressing and Ace wrap Neuro: Alert/Oriented, Abnormal Sensory Reduced light touch sensation right foot and Abnormal Motor 5/5 bilateral deltoids, biceps, triceps, grip 4/5 in the right hip flexion, knee extensor, 3 minus, ankle dorsal flexor 3 minus to flexion and extension, 4 minus. Left hip flexion, knee extension Musc/Skel:  Extremity tender Tenderness over TMA site as expected, foot intrinsic atrophy on the right side Gen. no acute distress   Assessment/Plan: 1. Functional deficits secondary to left TMA, nonweightbearing which require 3+ hours per day of interdisciplinary therapy in a comprehensive inpatient rehab setting. Physiatrist is providing close team supervision and 24 hour management of active  medical problems listed below. Physiatrist and rehab team continue to assess barriers to discharge/monitor patient progress toward functional and medical goals. FIM:             Function - Chair/bed transfer Chair/bed transfer method: Squat pivot, Stand pivot Chair/bed transfer assist level: Touching or steadying assistance (Pt > 75%) Chair/bed transfer assistive device: Armrests, Walker Chair/bed transfer details: Verbal cues for precautions/safety, Verbal cues for safe use of DME/AE  Function - Locomotion: Wheelchair Will patient use wheelchair at discharge?: Yes Type: Manual Max wheelchair distance: 150 ft(using B UEs) Assist Level: Touching or steadying assistance (Pt > 75%) Assist Level: Touching or steadying assistance (Pt > 75%) Assist Level: Touching or steadying assistance (Pt > 75%) Turns around,maneuvers to table,bed, and toilet,negotiates 3% grade,maneuvers on rugs and over doorsills: No Function - Locomotion: Ambulation Assistive device: Walker-rolling Max distance: 10 ft Assist level: Moderate assist (Pt 50 - 74%)(hopping technique secondary to L LE NWB no DARCO shoe in room) Assist level: Moderate assist (Pt 50 - 74%) Walk 50 feet with 2 turns activity did not occur: Safety/medical concerns(fatigue, L LE NWB, no DARCO shoe in room) Walk 150 feet activity did not occur: Safety/medical concerns Walk 10 feet on uneven surfaces activity did not occur: Safety/medical concerns  Function - Comprehension Comprehension: Auditory Comprehension assist level: Follows complex conversation/direction with no assist  Function - Expression Expression: Verbal Expression assist level: Expresses complex ideas: With no assist  Function - Social Interaction Social Interaction assist level: Interacts appropriately 90% of the time - Needs monitoring or encouragement for participation or interaction.  Function - Problem Solving Problem solving assist level: Solves complex 90% of  the time/cues < 10% of the time  Function - Memory Memory assist level: Recognizes or recalls 90% of the time/requires cueing < 10% of the time Patient normally able to recall (first 3 days only): Current season, Staff names and faces, That he or she is in a hospital Medical Problem List and Plan: 1.Decreased functional mobilitysecondary to left transmetatarsal amputation 11/19/2016 after recent left first and second toe amputation with gangrenous changes CIR evaluations, PT, OT today, team conference in a.m. 2. DVT Prophylaxis/Anticoagulation: Subcutaneous heparin. Monitor for any bleeding episodes 3. Pain Management:Cymbalta 60 mg daily, Neurontin 300 mg 3 times a day, hydrocodone as needed, pain is adequately controlled at the current time 4. Mood:Provide emotional support 5. Neuropsych: This patientiscapable of making decisions on herown behalf. 6. Skin/Wound Care:Routine skin checks 7. Fluids/Electrolytes/Nutrition:Routine I&O's with follow-up chemistries 8.Acute blood loss anemia. Follow-up CBC, most recent hemoglobin on 11/21/2016 was 8.0, stable  compared to prior values. 9.Diabetes mellitus peripheral neuropathy hemoglobin A1c 7.3. 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 Continue aspirin and Plavix. No chest pain or shortness of breath 12.Hyperlipidemia. Lipitor 13.Tobacco abuse. Patient recently stopped smoking 7 months ago. Provide counseling  LOS (Days) 2 A FACE TO FACE EVALUATION WAS PERFORMED  Ainsley Sanguinetti E 11/23/2016, 8:36 AM

## 2016-11-23 NOTE — Progress Notes (Signed)
Initial Nutrition Assessment  DOCUMENTATION CODES:   Not applicable  INTERVENTION:  Continue Ensure Enlive po BID, each supplement provides 350 kcal and 20 grams of protein.  Encourage adequate PO intake.   NUTRITION DIAGNOSIS:   Increased nutrient needs related to wound healing as evidenced by estimated needs.  GOAL:   Patient will meet greater than or equal to 90% of their needs  MONITOR:   PO intake, Supplement acceptance, Labs, Skin, Weight trends, I & O's  REASON FOR ASSESSMENT:   Malnutrition Screening Tool    ASSESSMENT:   66 y.o. right handed female diabetes mellitus, fibromyalgia, hypertension, CAD with CABG maintained on aspirin and Plavix, recently stopped smoking 7 months ago, peripheral vascular disease with recent left first and second toe amputations 10/13/2016. Presented 11/19/2016 with surgical wound dehiscence gangrenous changes of left foot. Wound was not felt to be salvageable. Underwent left transmetatarsal amputation 11/19/2016.   Pt reports having a good appetite currently. Meal completion has been 50-100% with 100% at lunch today. Pt reports appetite was varied PTA however still consumed at least 3 meals a day. Noted Pt with a 10% weight loss in 3 months. Noted pt currently on lasix. Weight loss may be related to fluid status. Pt currently has Ensure ordered and has been consuming them. RD to continue with current orders for wound healing. Labs and medications reviewed.   NUTRITION - FOCUSED PHYSICAL EXAM:    Most Recent Value  Orbital Region  Unable to assess  Upper Arm Region  No depletion  Thoracic and Lumbar Region  No depletion  Buccal Region  Unable to assess  Temple Region  Unable to assess  Clavicle Bone Region  No depletion  Clavicle and Acromion Bone Region  No depletion  Scapular Bone Region  Unable to assess  Dorsal Hand  Mild depletion  Patellar Region  Moderate depletion  Anterior Thigh Region  Moderate depletion  Posterior Calf  Region  Moderate depletion  Edema (RD Assessment)  Mild  Hair  Reviewed  Eyes  Reviewed  Mouth  Reviewed  Skin  Reviewed  Nails  Reviewed       Diet Order:  Diet Carb Modified Fluid consistency: Thin; Room service appropriate? Yes  EDUCATION NEEDS:   Education needs have been addressed  Skin:  Skin Assessment: (Incision L foot)  Last BM:  11/10  Height:   Ht Readings from Last 1 Encounters:  11/21/16 5\' 3"  (1.6 m)    Weight:   Wt Readings from Last 1 Encounters:  11/19/16 121 lb 12.8 oz (55.2 kg)    Ideal Body Weight:  52.27 kg  BMI:  Body mass index is 21.58 kg/m.  Estimated Nutritional Needs:   Kcal:  1600-1800  Protein:  65-75 grams  Fluid:  1.6 - 1.8 L/day    Roslyn SmilingStephanie Rodriquez Thorner, MS, RD, LDN Pager # [phone] After hours/ weekend pager # [phone]

## 2016-11-23 NOTE — Progress Notes (Signed)
Occupational Therapy Session Note  Patient Details  Name: Andrea [name] MRN: [id number] Date of Birth: Dec 16, 1950  Today's Date: 11/23/2016 OT Individual Time: 1330-1400 OT Individual Time Calculation (min): 30 min    Short Term Goals: Week 1:  OT Short Term Goal 1 (Week 1): Pt will completed toilet transfer with min A  OT Short Term Goal 2 (Week 1): Pt will completed LB dressing with CGA  OT Short Term Goal 3 (Week 1): Pt will completed grooming tasks at sink while standing with minA  OT Short Term Goal 4 (Week 1): Pt will complete shower transfers with min A   Skilled Therapeutic Interventions/Progress Updates:    Pt seen for OT session focusing on functional transfers, activity tolerance and standing balance. Pt asleep in supine upon arrival, easily awoken and agreeable to tx session.  She donned DARCO with set-up from bed position. Stand pivot with guarding assist using RW. Pt requesting to wash hair, used shampoo cap in w/c position and then stood at sink to brush hair with steadying assist. Completed UB bathing and desired to change shirt, completed with set-up. She ambulated within room with RW and min A into bathroom to complete toileting task, positive BM void. Returned to sink to complete hand hygiene in stand. Pt left seated in w/c at end of session, all needs in reach.   Therapy Documentation Precautions:  Precautions Precautions: Fall Restrictions Weight Bearing Restrictions: Yes LLE Weight Bearing: Non weight bearing Other Position/Activity Restrictions: Darco shoe for right foot with weightbearing. Pain: Pain Assessment Pain Assessment: No/denies pain Pain Score: "Hurts a little" Pain Type: Acute pain Pain Location: Foot Pain Orientation: Left Pain Descriptors / Indicators: Stabbing Pain Frequency: Intermittent Pain Intervention(s): Respositioned  See Function Navigator for Current Functional Status.   Therapy/Group: Individual Therapy  [name], Andrea Thissen  C 11/23/2016, 12:45 PM

## 2016-11-23 NOTE — Progress Notes (Signed)
Physical Therapy Session Note  Patient Details  Name: Andrea [name] MRN: [id number] Date of Birth: 08-27-1950  Today's Date: 11/23/2016 PT Individual Time: 0905-1005 PT Individual Time Calculation (min): 60 min   Short Term Goals: Week 1:  PT Short Term Goal 1 (Week 1): Pt will ambulate x 50 ft with min assist and LRAD PT Short Term Goal 2 (Week 1): Pt will perform bed<>chair transfers with supervision PT Short Term Goal 3 (Week 1): Pt will propel w/c x 150 ft with supervision  Skilled Therapeutic Interventions/Progress Updates:    Focused on functional endurance, dynamic sitting balance, and sit <> stands with RW to get dressed this AM at supervision level in supine and seated EOB) except min assist for balance in standing for pulling up pants. Pt requires verbal and tactile cues for anterior weightshift to maintain standing balance. Min assist for sit <> stands. Recommended to have family bring in shoe for RLE to help with heigh difference [facility]. Completed min assist stand pivot to w/c and performed hygiene at sink with set-up assist. Stair negotiation training to prepare for d/c with pt requiring min assist for balance and cues for which foot to lead with. Discussed home set-up and pt reports too small of space to use a RW in home, so [facility]. Discussed if there was an option to have family assist with moving some furniture to make more accessible if RW was recommended - pt wasn't sure if that would be possible at this time. Home environment mobility training in ADL apartment including couch transfer (pt has been sleeping on couch downstairs due to bedroom being on the second floor) and gait over carpet and in/out of bathroom due to toileting needs (pt continent of urine and able to perform clothing management and transfers with min assist). Gait training x 20', x 30', and x 35' during session with decreased reliance on BUE support noted and more  fluid gait pattern. Pt returned to bed end of session to rest.   Therapy Documentation Precautions:  Precautions Precautions: Fall Restrictions Weight Bearing Restrictions: Yes LLE Weight Bearing: Non weight bearing Other Position/Activity Restrictions: Darco boot ordered for WB through heel   Vital Signs: Therapy Vitals Pulse Rate: 100 BP: 120/67 Patient Position (if appropriate): Sitting Pain: reports 7/10 pain in bottom; medication given by RN during session.   See Function Navigator for Current Functional Status.   Therapy/Group: Individual Therapy  Karolee StampsGray, Chrysta Fulcher Darrol PokeBrescia  Margarita Bobrowski B. Darianna Amy, PT, DPT  11/23/2016, 10:25 AM

## 2016-11-24 ENCOUNTER — Inpatient Hospital Stay (HOSPITAL_COMMUNITY): Payer: BLUE CROSS/BLUE SHIELD | Admitting: Occupational Therapy

## 2016-11-24 ENCOUNTER — Inpatient Hospital Stay (HOSPITAL_COMMUNITY): Payer: BLUE CROSS/BLUE SHIELD | Admitting: Physical Therapy

## 2016-11-24 DIAGNOSIS — I10 Essential (primary) hypertension: Secondary | ICD-10-CM

## 2016-11-24 DIAGNOSIS — Z794 Long term (current) use of insulin: Secondary | ICD-10-CM

## 2016-11-24 LAB — GLUCOSE, CAPILLARY
GLUCOSE-CAPILLARY: 69 mg/dL (ref 65–99)
GLUCOSE-CAPILLARY: 55 mg/dL — AB (ref 65–99)
GLUCOSE-CAPILLARY: 100 mg/dL — AB (ref 65–99)
GLUCOSE-CAPILLARY: 82 mg/dL (ref 65–99)
Glucose-Capillary: 100 mg/dL — ABNORMAL HIGH (ref 65–99)
Glucose-Capillary: 117 mg/dL — ABNORMAL HIGH (ref 65–99)
Glucose-Capillary: 71 mg/dL (ref 65–99)
Glucose-Capillary: 58 mg/dL — ABNORMAL LOW (ref 65–99)
Glucose-Capillary: 91 mg/dL (ref 65–99)
Glucose-Capillary: 143 mg/dL — ABNORMAL HIGH (ref 65–99)
Glucose-Capillary: 115 mg/dL — ABNORMAL HIGH (ref 65–99)
Glucose-Capillary: 125 mg/dL — ABNORMAL HIGH (ref 65–99)

## 2016-11-24 MED ORDER — INSULIN GLARGINE 100 UNIT/ML ~~LOC~~ SOLN
25.0000 [IU] | Freq: Every day | SUBCUTANEOUS | Status: DC
  Administered 2016-11-24 – 2016-12-04 (×10): 25 [IU] via SUBCUTANEOUS
  Filled 2016-11-24 (×11): qty 0.25

## 2016-11-24 MED ORDER — FUROSEMIDE 20 MG PO TABS
10.0000 mg | ORAL_TABLET | Freq: Every day | ORAL | Status: DC
  Administered 2016-11-25 – 2016-11-26 (×2): 10 mg via ORAL
  Filled 2016-11-24 (×2): qty 1

## 2016-11-24 NOTE — Progress Notes (Signed)
Occupational Therapy Session Note  Patient Details  Name: Andrea [name] MRN: [id number] Date of Birth: 1950-01-21  Today's Date: 11/24/2016 OT Individual Time: 1300-1355 OT Individual Time Calculation (min): 55 min    Short Term Goals: Week 1:  OT Short Term Goal 1 (Week 1): Pt will completed toilet transfer with min A  OT Short Term Goal 2 (Week 1): Pt will completed LB dressing with CGA  OT Short Term Goal 3 (Week 1): Pt will completed grooming tasks at sink while standing with minA  OT Short Term Goal 4 (Week 1): Pt will complete shower transfers with min A   Skilled Therapeutic Interventions/Progress Updates:    Treatment session with focus on BUE strengthening and standing balance.  Pt received upright in w/c, discussed pt goals as well as OT goals.  Pt propelled w/c to therapy gym with BUE strengthening and endurance, requiring mod cues for stroke technique as pt constantly veering towards Lt.  Engaged in standing activity to increase standing balance and posture.  Completed pipe tree puzzle in standing with cues for upright posture.  Placed 1-2" wedge under Rt foot to increase symmetrical stance (due to Darco boot), with noted improved posture.  Pt tolerated standing 3-4 mins at a time before requiring seated rest break.  Discussed energy conservation strategies during ADL and IADL tasks with pt verbalizing understanding.  Therapy Documentation Precautions:  Precautions Precautions: Fall Restrictions Weight Bearing Restrictions: No LLE Weight Bearing: Non weight bearing Other Position/Activity Restrictions: Darco shoe for left foot with weightbearing. General:   Vital Signs: Therapy Vitals Temp: 98.6 F (37 C) Temp Source: Oral Pulse Rate: 68 Resp: 17 BP: (!) 135/54 Patient Position (if appropriate): Lying Oxygen Therapy SpO2: 100 % O2 Device: Not Delivered Pain:  Pt with no c/o pain  See Function Navigator for Current Functional Status.   Therapy/Group:  Individual Therapy  Rosalio LoudHOXIE, Kellin Bartling 11/24/2016, 3:39 PM

## 2016-11-24 NOTE — Progress Notes (Signed)
Occupational Therapy Session Note  Patient Details  Name: Andrea [name] MRN: [id number] Date of Birth: 11/18/1950  Today's Date: 11/24/2016 OT Individual Time: [id number] OT Individual Time Calculation (min): 53 min    Short Term Goals: Week 1:  OT Short Term Goal 1 (Week 1): Pt will completed toilet transfer with min A  OT Short Term Goal 2 (Week 1): Pt will completed LB dressing with CGA  OT Short Term Goal 3 (Week 1): Pt will completed grooming tasks at sink while standing with minA  OT Short Term Goal 4 (Week 1): Pt will complete shower transfers with min A   Skilled Therapeutic Interventions/Progress Updates:    1:1 Self care retraining at sink level - pt's choice. Focus on stand pivot transfers with RW with steady A as pt with posterior lean with dynamic standing balance. Pt able to perform dressed with close supervision. Pt stood with steadying A for brushing teeth. Pt performed laundry task sit to stand with supervision from w/c. Pt able to propel w/c to laundry room mod I with extra time.   Therapy Documentation Precautions:  Precautions Precautions: Fall Restrictions Weight Bearing Restrictions: No LLE Weight Bearing: Non weight bearing Other Position/Activity Restrictions: Darco shoe for left foot with weightbearing. General:   Vital Signs: Oxygen Therapy SpO2: 96 % O2 Device: Not Delivered Pain:   no c/o pain  ADL: ADL Eating: Independent Where Assessed-Eating: Wheelchair Grooming: Setup Where Assessed-Grooming: Sitting at sink Upper Body Bathing: Supervision/safety, Minimal assistance Where Assessed-Upper Body Bathing: Shower Lower Body Bathing: Moderate assistance Where Assessed-Lower Body Bathing: Shower Upper Body Dressing: Setup Where Assessed-Upper Body Dressing: Wheelchair Lower Body Dressing: Minimal assistance Where Assessed-Lower Body Dressing: Wheelchair Toileting: Moderate assistance Where Assessed-Toileting: Teacher, adult educationToilet Toilet Transfer:  CuratorMinimal assistance Toilet Transfer Method: Surveyor, mineralstand pivot Toilet Transfer Equipment: Bedside commode, Grab bars Tub/Shower Transfer: Minimal Radiation protection practitionerassistance Tub/Shower Transfer Method: Stand pivot Tub/Shower Equipment: Grab bars, Walk in shower, Shower seat with back Film/video editorWalk-In Shower Transfer: Minimal assistance Film/video editorWalk-In Shower Transfer Method: Warden/rangertand pivot Walk-In Shower Equipment: Grab bars, Shower seat with back  See Function Navigator for Current Functional Status.   Therapy/Group: Individual Therapy  Roney MansSmith, Kaitlyn Franko [facility] 11/24/2016, 12:07 PM

## 2016-11-24 NOTE — IPOC Note (Signed)
Overall Plan of Care [facility](IPOC) Patient Details Name: Andrea [name] B Barlowe MRN: [id number] DOB: 10-Aug-1950  Admitting Diagnosis: <principal problem not specified>  Hospital Problems: Active Problems:   Status post transmetatarsal amputation of foot, left (HCC)     Functional Problem List: Nursing Endurance, Medication Management, Motor, Pain, Perception, Safety, Sensory, Skin Integrity, Bowel, Edema, Bladder  PT Balance, Edema, Endurance, Pain, Nutrition, Sensory, Skin Integrity, Motor  OT Balance, Pain, Safety, Endurance, Motor  SLP    TR         Basic ADL's: OT Grooming, Bathing, Dressing, Toileting     Advanced  ADL's: OT Simple Meal Preparation     Transfers: PT Bed Mobility, Bed to Chair, Car, Furniture, Floor  OT Toilet, Research scientist (life sciences)Tub/Shower     Locomotion: PT Ambulation, Psychologist, prison and probation servicesWheelchair Mobility, Stairs     Additional Impairments: OT    SLP        TR      Anticipated Outcomes Item Anticipated Outcome  Self Feeding IND  Swallowing      Basic self-care  SUP  Toileting  SUP   Bathroom Transfers SUP  Bowel/Bladder  mod I  Transfers  Mod I  Locomotion  Mod I   Communication     Cognition     Pain  less than 2  Safety/Judgment  mod I   Therapy Plan: PT Intensity: Minimum of 1-2 x/day ,45 to 90 minutes PT Frequency: 5 out of 7 days PT Duration Estimated Length of Stay: 10-14 days OT Intensity: Minimum of 1-2 x/day, 45 to 90 minutes OT Frequency: 5 out of 7 days OT Duration/Estimated Length of Stay: 10-14 says      Team Interventions: Nursing Interventions Patient/Family Education, Bowel Management, Pain Management, Skin Care/Wound Management, Psychosocial Support, Bladder Management, Disease Management/Prevention, Medication Management, Discharge Planning  PT interventions Ambulation/gait training, Discharge planning, Functional mobility training, Therapeutic Activities, Balance/vestibular training, Disease management/prevention, Neuromuscular re-education,  Therapeutic Exercise, Skin care/wound management, Wheelchair propulsion/positioning, DME/adaptive equipment instruction, Pain management, Splinting/orthotics, UE/LE Strength taining/ROM, Patient/family education, Stair training, UE/LE Coordination activities  OT Interventions Discharge planning, Pain management, Self Care/advanced ADL retraining, Therapeutic Activities, UE/LE Coordination activities, Therapeutic Exercise, Functional mobility training, Patient/family education, Community reintegration, DME/adaptive equipment instruction, UE/LE Strength taining/ROM  SLP Interventions    TR Interventions    SW/CM Interventions Discharge Planning, Psychosocial Support   Barriers to Discharge MD  Medical stability, Wound care and NWB LLE  Nursing Incontinence, Wound Care    PT Lack of/limited family support pt reports unsure if someone will be able to assist her at d/c (household management, cooking, etc)  OT Inaccessible home environment, Decreased caregiver support, Home environment access/layout, Lack of/limited family support decreased level of IND with ADLs  SLP      SW       Team Discharge Planning: Destination: PT-Home ,OT- Home , SLP-  Projected Follow-up: PT-Home health PT, OT-  Home health OT, SLP-  Projected Equipment Needs: PT-To be determined, OT- 3 in 1 bedside comode, SLP-  Equipment Details: PT- , OT-Pt has cane, two walkers at home Patient/family involved in discharge planning: PT- Patient,  OT-Patient, SLP-   MD ELOS: 7-10d Medical Rehab Prognosis:  Excellent Assessment:  66 y.o.right handed femalediabetes mellitus, fibromyalgia, hypertension, CAD with CABG maintained on aspirin and Plavix, recently stopped smoking 7 months ago, peripheral vascular disease with recent left first and second toe amputations 10/13/2016 [facility] and recently had returned home. She lives alone was using a cane prior to admission. She has  a sister in the area that  works part time. 2 daughters with very little support. 2 level home with one step to entry. Presented 11/19/2016 with surgical wound dehiscence gangrenous changes of left foot. Wound was not felt to be salvageable. Underwent left transmetatarsal amputation 11/19/2016. Hospital course pain management. Acute blood loss anemia 7.6.Subcutaneous heparin for DVT prophylaxis.   Now requiring 24/7 Rehab RN,MD, as well as CIR level PT, OT Treatment team will focus on ADLs and mobility with goals set at mod I /sup See Team Conference Notes for weekly updates to the plan of care

## 2016-11-24 NOTE — Progress Notes (Signed)
Hypoglycemic Event  CBG: 55     Treatment: 15 GM carbohydrate snack  Symptoms: Sweaty  Follow-up CBG: Time:0358 CBG Result:58  Treatment:15 GM carbohydrate snack  Follow-up CBG: Time:0420 CBG Result:91   Reason for event: Unknown  Comments/MD notified:Hypoglycemic protocol initiated. Pt monitored    Andrea BannisterLisa  Tiphani Mells

## 2016-11-24 NOTE — Progress Notes (Signed)
Physical Therapy Session Note  Patient Details  Name: Andrea [name] MRN: [id number] Date of Birth: [birth year]  Today's Date: 11/24/2016 PT Individual Time: 1130-1200 PT Individual Time Calculation (min): 30 min   Short Term Goals: Week 1:  PT Short Term Goal 1 (Week 1): Pt will ambulate x 50 ft with min assist and LRAD PT Short Term Goal 2 (Week 1): Pt will perform bed<>chair transfers with supervision PT Short Term Goal 3 (Week 1): Pt will propel w/c x 150 ft with supervision  Skilled Therapeutic Interventions/Progress Updates: Pt received seated in w/c, denies pain and agreeable to treatment. Provided pt with home measurement sheet; discussed with pt that we would like a family member to measure surface heights, door openings, distance between furniture, and possibly walk through home with a walker and assess accessibility, take pictures if needed. Pt agreeable, reports her mother in law may be able to do it. Ambulatory transfer w/c into laundry room with RW and min guard, one minor LOB posteriorly recovered with minA. Pt retrieves items from dryer and folds them in standing with min guard for balance. Returned to room w/c propulsion with S using BUE for strengthening and endurance. Pt returned clothing to drawers from sitting position with S and increased time. Remained seated in w/c at end of session, all needs in reach.      Therapy Documentation Precautions:  Precautions Precautions: Fall Restrictions Weight Bearing Restrictions: No Other Position/Activity Restrictions: Darco shoe for left foot with weightbearing.   See Function Navigator for Current Functional Status.   Therapy/Group: Individual Therapy  Vista Lawmanlizabeth J Tygielski 11/24/2016, 12:00 PM

## 2016-11-24 NOTE — Progress Notes (Signed)
Physical Therapy Session Note  Patient Details  Name: Andrea [name] MRN: [id number] Date of Birth: [birth year]  Today's Date: 11/24/2016 PT Individual Time: 2841-3244 PT Individual Time Calculation (min): 62 min   Short Term Goals: Week 1:  PT Short Term Goal 1 (Week 1): Pt will ambulate x 50 ft with min assist and LRAD PT Short Term Goal 2 (Week 1): Pt will perform bed<>chair transfers with supervision PT Short Term Goal 3 (Week 1): Pt will propel w/c x 150 ft with supervision  Skilled Therapeutic Interventions/Progress Updates: Pt presented asleep, easily aroused and agreeable to therapy. Pt performed supine to sit with supervision and use of rails. Pt donned shoe and Darco boot with supervision and performed stand pivot transfer to w/c min guard. Pt transported to rehab gym for energy conservation. Participated in standing tolerance and balance activity putting puzzle together. Pt able to maintain standing tolerance >15 min with heavy favor to RLE and decreased usage of UE against table tray. After seated rest pt able to tolerate additional 8 min in standing. Gait training 67f with RW min guard with cues for safe use of RW as pt would keep RW far forward. Pt returned to room via w/c and performed stand pivot min guard at toilet (+void). Pt returned to w/c in same manner and performed squat pivot to bed. Pt returned to supine supervision level and left with call bell within reach and needs met.      Therapy Documentation Precautions:  Precautions Precautions: Fall Restrictions Weight Bearing Restrictions: No LLE Weight Bearing: Non weight bearing Other Position/Activity Restrictions: Darco shoe for left foot with weightbearing. General:   Vital Signs: Therapy Vitals Temp: 98.6 F (37 C) Temp Source: Oral Pulse Rate: 68 Resp: 17 BP: (!) 135/54 Patient Position (if appropriate): Lying Oxygen Therapy SpO2: 100 % O2 Device: Not Delivered   See Function Navigator for Current  Functional Status.   Therapy/Group: Individual Therapy  Caliah Kopke  Esiquio Boesen, PTA   11/24/2016, 4:00 PM

## 2016-11-24 NOTE — Progress Notes (Signed)
Subjective/Complaints: Patient had hypoglycemic episode.  She was symptomatic with this.  She states that this does not happen at home.  Review of systems denies chest pain, shortness of breath, nausea, vomiting, diarrhea, constipation  Objective: Vital Signs: Blood pressure (!) 109/58, pulse 87, temperature 98.6 F (37 C), temperature source Oral, resp. rate 16, height 5' 3"  (1.6 m), SpO2 95 %. No results found. Results for orders placed or performed during the hospital encounter of 11/21/16 (from the past 72 hour(s))  Glucose, capillary     Status: Abnormal   Collection Time: 11/21/16  4:33 PM  Result Value Ref Range   Glucose-Capillary 189 (H) 65 - 99 mg/dL  Glucose, capillary     Status: Abnormal   Collection Time: 11/21/16 11:15 PM  Result Value Ref Range   Glucose-Capillary 158 (H) 65 - 99 mg/dL  Glucose, capillary     Status: None   Collection Time: 11/22/16  8:12 AM  Result Value Ref Range   Glucose-Capillary 95 65 - 99 mg/dL  Glucose, capillary     Status: None   Collection Time: 11/22/16 12:02 PM  Result Value Ref Range   Glucose-Capillary 98 65 - 99 mg/dL  Glucose, capillary     Status: None   Collection Time: 11/22/16  4:24 PM  Result Value Ref Range   Glucose-Capillary 69 65 - 99 mg/dL  Glucose, capillary     Status: None   Collection Time: 11/22/16  4:47 PM  Result Value Ref Range   Glucose-Capillary 68 65 - 99 mg/dL   Comment 1 Notify RN   Glucose, capillary     Status: Abnormal   Collection Time: 11/22/16  5:28 PM  Result Value Ref Range   Glucose-Capillary 118 (H) 65 - 99 mg/dL  Glucose, capillary     Status: Abnormal   Collection Time: 11/22/16  9:34 PM  Result Value Ref Range   Glucose-Capillary 107 (H) 65 - 99 mg/dL  Glucose, capillary     Status: Abnormal   Collection Time: 11/23/16  6:23 AM  Result Value Ref Range   Glucose-Capillary 100 (H) 65 - 99 mg/dL  CBC WITH DIFFERENTIAL     Status: Abnormal   Collection Time: 11/23/16  8:23 AM  Result  Value Ref Range   WBC 8.0 4.0 - 10.5 K/uL   RBC 3.07 (L) 3.87 - 5.11 MIL/uL   Hemoglobin 8.5 (L) 12.0 - 15.0 g/dL   HCT 28.2 (L) 36.0 - 46.0 %   MCV 91.9 78.0 - 100.0 fL   MCH 27.7 26.0 - 34.0 pg   MCHC 30.1 30.0 - 36.0 g/dL   RDW 15.2 11.5 - 15.5 %   Platelets 293 150 - 400 K/uL   Neutrophils Relative % 66 %   Neutro Abs 5.3 1.7 - 7.7 K/uL   Lymphocytes Relative 26 %   Lymphs Abs 2.1 0.7 - 4.0 K/uL   Monocytes Relative 4 %   Monocytes Absolute 0.3 0.1 - 1.0 K/uL   Eosinophils Relative 4 %   Eosinophils Absolute 0.3 0.0 - 0.7 K/uL   Basophils Relative 0 %   Basophils Absolute 0.0 0.0 - 0.1 K/uL  Comprehensive metabolic panel     Status: Abnormal   Collection Time: 11/23/16  8:23 AM  Result Value Ref Range   Sodium 137 135 - 145 mmol/L   Potassium 5.0 3.5 - 5.1 mmol/L   Chloride 100 (L) 101 - 111 mmol/L   CO2 28 22 - 32 mmol/L   Glucose, Bld 181 (  H) 65 - 99 mg/dL   BUN 31 (H) 6 - 20 mg/dL   Creatinine, Ser 1.00 0.44 - 1.00 mg/dL   Calcium 8.7 (L) 8.9 - 10.3 mg/dL   Total Protein 6.8 6.5 - 8.1 g/dL   Albumin 2.7 (L) 3.5 - 5.0 g/dL   AST 13 (L) 15 - 41 U/L   ALT 12 (L) 14 - 54 U/L   Alkaline Phosphatase 62 38 - 126 U/L   Total Bilirubin 0.7 0.3 - 1.2 mg/dL   GFR calc non Af Amer 59 (L) >60 mL/min   GFR calc Af Amer >60 >60 mL/min    Comment: (NOTE) The eGFR has been calculated using the CKD EPI equation. This calculation has not been validated in all clinical situations. eGFR's persistently <60 mL/min signify possible Chronic Kidney Disease.    Anion gap 9 5 - 15  Glucose, capillary     Status: Abnormal   Collection Time: 11/23/16 12:03 PM  Result Value Ref Range   Glucose-Capillary 100 (H) 65 - 99 mg/dL   Comment 1 Notify RN   Glucose, capillary     Status: Abnormal   Collection Time: 11/23/16  5:00 PM  Result Value Ref Range   Glucose-Capillary 117 (H) 65 - 99 mg/dL   Comment 1 Notify RN   Glucose, capillary     Status: None   Collection Time: 11/23/16  8:58 PM   Result Value Ref Range   Glucose-Capillary 69 65 - 99 mg/dL  Glucose, capillary     Status: None   Collection Time: 11/23/16  9:59 PM  Result Value Ref Range   Glucose-Capillary 71 65 - 99 mg/dL   Comment 1 Notify RN   Glucose, capillary     Status: Abnormal   Collection Time: 11/24/16  3:26 AM  Result Value Ref Range   Glucose-Capillary 55 (L) 65 - 99 mg/dL   Comment 1 Notify RN   Glucose, capillary     Status: Abnormal   Collection Time: 11/24/16  3:53 AM  Result Value Ref Range   Glucose-Capillary 58 (L) 65 - 99 mg/dL  Glucose, capillary     Status: None   Collection Time: 11/24/16  4:20 AM  Result Value Ref Range   Glucose-Capillary 91 65 - 99 mg/dL   Comment 1 Notify RN   Glucose, capillary     Status: Abnormal   Collection Time: 11/24/16  6:37 AM  Result Value Ref Range   Glucose-Capillary 143 (H) 65 - 99 mg/dL   Comment 1 Notify RN      HEENT: normal Cardio: RRR and No murmur Resp: CTA B/L and Unlabored GI: BS positive and Nontender, nondistended Extremity:  No Edema Skin:   Other Left TMA wrapped with postoperative dressing and Ace wrap Neuro: Alert/Oriented, Abnormal Sensory Reduced light touch sensation right foot and Abnormal Motor 5/5 bilateral deltoids, biceps, triceps, grip 4/5 in the right hip flexion, knee extensor, 3 minus, ankle dorsal flexor 3 minus to flexion and extension, 4 minus. Left hip flexion, knee extension Musc/Skel:  Extremity tender Tenderness over TMA site as expected, foot intrinsic atrophy on the right side Gen. no acute distress   Assessment/Plan: 1. Functional deficits secondary to left TMA, nonweightbearing which require 3+ hours per day of interdisciplinary therapy in a comprehensive inpatient rehab setting. Physiatrist is providing close team supervision and 24 hour management of active medical problems listed below. Physiatrist and rehab team continue to assess barriers to discharge/monitor patient progress toward functional and  medical  goals. FIM: Function - Bathing Position: Wheelchair/chair at sink Body parts bathed by patient: Right arm, Left arm, Chest, Right upper leg, Left upper leg, Right lower leg, Left lower leg, Abdomen, Front perineal area, Buttocks Body parts bathed by helper: Back Assist Level: Touching or steadying assistance(Pt > 75%)  Function- Upper Body Dressing/Undressing What is the patient wearing?: Pull over shirt/dress, Bra Bra - Perfomed by patient: Thread/unthread right bra strap, Thread/unthread left bra strap, Hook/unhook bra (pull down sports bra) Pull over shirt/dress - Perfomed by patient: Thread/unthread right sleeve, Thread/unthread left sleeve, Put head through opening, Pull shirt over trunk Assist Level: Supervision or verbal cues, Set up Set up : To obtain clothing/put away Function - Lower Body Dressing/Undressing What is the patient wearing?: Underwear, Pants, Non-skid slipper socks, Shoes Position: Wheelchair/chair at sink Underwear - Performed by patient: Thread/unthread right underwear leg, Thread/unthread left underwear leg, Pull underwear up/down Pants- Performed by patient: Thread/unthread right pants leg, Thread/unthread left pants leg, Fasten/unfasten pants, Pull pants up/down Non-skid slipper socks- Performed by patient: Don/doff right sock Shoes - Performed by patient: Don/doff left shoe(darko) Shoes - Performed by helper: Don/doff left shoe(DARCO) Assist for lower body dressing: Touching or steadying assistance (Pt > 75%)  Function - Toileting Toileting steps completed by patient: Adjust clothing prior to toileting, Performs perineal hygiene, Adjust clothing after toileting Toileting Assistive Devices: Grab bar or rail Assist level: Touching or steadying assistance (Pt.75%)  Function - Air cabin crew transfer assistive device: Grab bar, Walker Assist level to toilet: Touching or steadying assistance (Pt > 75%) Assist level from toilet: Touching or  steadying assistance (Pt > 75%)  Function - Chair/bed transfer Chair/bed transfer method: Stand pivot Chair/bed transfer assist level: Touching or steadying assistance (Pt > 75%) Chair/bed transfer assistive device: Armrests, Walker Chair/bed transfer details: Verbal cues for precautions/safety, Verbal cues for safe use of DME/AE  Function - Locomotion: Wheelchair Will patient use wheelchair at discharge?: Yes Type: Manual Max wheelchair distance: 150 ft(using B UEs) Assist Level: Touching or steadying assistance (Pt > 75%) Assist Level: Touching or steadying assistance (Pt > 75%) Assist Level: Touching or steadying assistance (Pt > 75%) Turns around,maneuvers to table,bed, and toilet,negotiates 3% grade,maneuvers on rugs and over doorsills: No Function - Locomotion: Ambulation Assistive device: Walker-rolling, Other (comment)(DARCO) Max distance: 64' Assist level: Touching or steadying assistance (Pt > 75%) Assist level: Touching or steadying assistance (Pt > 75%) Walk 50 feet with 2 turns activity did not occur: Safety/medical concerns(fatigue, L LE NWB, no DARCO shoe in room) Walk 150 feet activity did not occur: Safety/medical concerns Walk 10 feet on uneven surfaces activity did not occur: Safety/medical concerns  Function - Comprehension Comprehension: Auditory Comprehension assist level: Follows complex conversation/direction with no assist  Function - Expression Expression: Verbal Expression assist level: Expresses complex ideas: With no assist  Function - Social Interaction Social Interaction assist level: Interacts appropriately 90% of the time - Needs monitoring or encouragement for participation or interaction.  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 Formulized individual treatment plan based on medical history, underlying problem and comorbidities. 2. DVT Prophylaxis/Anticoagulation: Subcutaneous heparin. Monitor for any bleeding episodes 3. Pain Management:Cymbalta 60 mg daily, Neurontin 300 mg 3 times a day, hydrocodone as needed, pain is adequately controlled at the current time 4. Mood:Provide emotional support 5. Neuropsych: This patientiscapable of making decisions on herown behalf. 6. Skin/Wound Care:Routine skin checks 7. Fluids/Electrolytes/Nutrition:Routine I&O's with follow-up chemistries 8.Acute blood loss anemia. Follow-up CBC,  hemoglobin on 11/21/2016 was 8.0, hemoglobin 8.5 on 11/23/2016.  We will continue to monitor 9.Diabetes mellitus peripheral neuropathy hemoglobin A1c 7.3. 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 Continue aspirin and Plavix. No chest pain or shortness of breath 12.Hyperlipidemia. Lipitor 13.Tobacco abuse. Patient recently stopped smoking 7 months ago. Provide counseling  LOS (Days) 3 A FACE TO FACE EVALUATION WAS PERFORMED  Evadne Ose  E 11/24/2016, 8:58 AM

## 2016-11-25 ENCOUNTER — Inpatient Hospital Stay (HOSPITAL_COMMUNITY): Payer: BLUE CROSS/BLUE SHIELD | Admitting: Physical Therapy

## 2016-11-25 ENCOUNTER — Inpatient Hospital Stay (HOSPITAL_COMMUNITY): Payer: BLUE CROSS/BLUE SHIELD | Admitting: Occupational Therapy

## 2016-11-25 ENCOUNTER — Inpatient Hospital Stay (HOSPITAL_COMMUNITY): Payer: BLUE CROSS/BLUE SHIELD

## 2016-11-25 ENCOUNTER — Encounter (HOSPITAL_COMMUNITY): Payer: BLUE CROSS/BLUE SHIELD | Admitting: Psychology

## 2016-11-25 ENCOUNTER — Telehealth: Payer: Self-pay | Admitting: Vascular Surgery

## 2016-11-25 DIAGNOSIS — D62 Acute posthemorrhagic anemia: Secondary | ICD-10-CM

## 2016-11-25 DIAGNOSIS — E162 Hypoglycemia, unspecified: Secondary | ICD-10-CM

## 2016-11-25 LAB — GLUCOSE, CAPILLARY
GLUCOSE-CAPILLARY: 67 mg/dL (ref 65–99)
GLUCOSE-CAPILLARY: 111 mg/dL — AB (ref 65–99)
Glucose-Capillary: 85 mg/dL (ref 65–99)
Glucose-Capillary: 82 mg/dL (ref 65–99)
Glucose-Capillary: 119 mg/dL — ABNORMAL HIGH (ref 65–99)
Glucose-Capillary: 224 mg/dL — ABNORMAL HIGH (ref 65–99)

## 2016-11-25 MED ORDER — METFORMIN HCL 500 MG PO TABS
500.0000 mg | ORAL_TABLET | Freq: Two times a day (BID) | ORAL | Status: DC
  Administered 2016-11-25 – 2016-12-05 (×19): 500 mg via ORAL
  Filled 2016-11-25 (×20): qty 1

## 2016-11-25 NOTE — Progress Notes (Signed)
Occupational Therapy Session Note  Patient Details  Name: Andrea [name] MRN: [id number] Date of Birth: [birth year]  Today's Date: 11/25/2016 OT Individual Time: 1130-1200 and 1515-1559 OT Individual Time Calculation (min): 30 min and 44 min    Short Term Goals: Week 1:  OT Short Term Goal 1 (Week 1): Pt will completed toilet transfer with min A  OT Short Term Goal 2 (Week 1): Pt will completed LB dressing with CGA  OT Short Term Goal 3 (Week 1): Pt will completed grooming tasks at sink while standing with minA  OT Short Term Goal 4 (Week 1): Pt will complete shower transfers with min A   Skilled Therapeutic Interventions/Progress Updates:    Session 1: Upon entering the room, pt seated in wheelchair awaiting therapist. Pt requesting to use bathroom. Pt required min verbal cues and assist to manage wheelchair parts. Pt standing and ambulating 10' with RW and darco shoe on L foot with min A for safety. Pt able to void and required steady assistance for balance while standing with clothing management. Pt washing hands at sink with supervision while seated in wheelchair. Pt propelled wheelchair with B UE's 100' onto carpeted surface with increased time and supervision. Pt petting therapy dogs while taking rest break. Pt returned to room in same manner as above. Pt transferring from wheelchair >bed with use of RW and steady assistance. Pt performed sit >supine with steady assistance and removing of darco shoe with close supervision for safety while seated EOB. Call bell and all needed items within reach upon exiting the room.   Session 2: Upon entering the room, pt supine in bed with nightgown on. Pt declined OOB activities secondary to no longer wearing modest clothing. Pt seen from bed level with focus on B UE strengthening exercises. OT demonstrated use of level 1 theraband exercises with pt returning demonstrations with min verbal cues for proper technique. Pt performed 3 sets of 10 alternating  punches, chest pulls, shoulder diagonals, and shower flexion with rest breaks as needed secondary to fatigue. Pt remained in bed with call bell and all needed items within reach upon exiting the room.   Therapy Documentation Precautions:  Precautions Precautions: Fall Restrictions Weight Bearing Restrictions: Yes LLE Weight Bearing: Non weight bearing(heel wt bearing ) Other Position/Activity Restrictions: Darco shoe for left foot with weightbearing. General:   Vital Signs: Oxygen Therapy SpO2: 98 % O2 Device: Not Delivered Pain:   ADL: ADL Eating: Independent Where Assessed-Eating: Wheelchair Grooming: Setup Where Assessed-Grooming: Sitting at sink Upper Body Bathing: Supervision/safety, Minimal assistance Where Assessed-Upper Body Bathing: Shower Lower Body Bathing: Moderate assistance Where Assessed-Lower Body Bathing: Shower Upper Body Dressing: Setup Where Assessed-Upper Body Dressing: Wheelchair Lower Body Dressing: Minimal assistance Where Assessed-Lower Body Dressing: Wheelchair Toileting: Moderate assistance Where Assessed-Toileting: Teacher, adult educationToilet Toilet Transfer: CuratorMinimal assistance Toilet Transfer Method: Surveyor, mineralstand pivot Toilet Transfer Equipment: Bedside commode, Grab bars Tub/Shower Transfer: Minimal Radiation protection practitionerassistance Tub/Shower Transfer Method: Stand pivot Tub/Shower Equipment: Grab bars, Walk in shower, Shower seat with back Film/video editorWalk-In Shower Transfer: Minimal assistance Film/video editorWalk-In Shower Transfer Method: Warden/rangertand pivot Walk-In Shower Equipment: Grab bars, Shower seat with back  See Function Navigator for Current Functional Status.   Therapy/Group: Individual Therapy  Alen BleacherBradsher, Amareon Phung P 11/25/2016, 12:30 PM

## 2016-11-25 NOTE — Care Management (Signed)
[facility] Individual Statement of Services  Patient Name:  Andrea [name]  Date:  11/25/2016  Welcome to the [facility].  Our goal is to provide you with an individualized program based on your diagnosis and situation, designed to meet your specific needs.  With this comprehensive rehabilitation program, you will be expected to participate in at least 3 hours of rehabilitation therapies Monday-Friday, with modified therapy programming on the weekends.  Your rehabilitation program will include the following services:  Physical Therapy (PT), Occupational Therapy (OT), 24 hour per day rehabilitation nursing, Therapeutic Recreaction (TR), Neuropsychology, Case Management (Social Worker), Rehabilitation Medicine, Nutrition Services and Pharmacy Services  Weekly team conferences will be held on Tuesdays to discuss your progress.  Your Social Worker will talk with you frequently to get your input and to update you on team discussions.  Team conferences with you and your family in attendance may also be held.  Expected length of stay: 10-14 days  Overall anticipated outcome: modified independent  Depending on your progress and recovery, your program may change. Your Social Worker will coordinate services and will keep you informed of any changes. Your Social Worker's name and contact numbers are listed  below.  The following services may also be recommended but are not provided by the [facility]:   Driving Evaluations  Home Health Rehabiltiation Services  Outpatient Rehabilitation Services    Arrangements will be made to provide these services after discharge if needed.  Arrangements include referral to agencies that provide these services.  Your insurance has been verified to be:  BCBS Your primary doctor is:  Magazine features editorAmao  Pertinent information will be shared with your doctor and your insurance company.  Social Worker:  [place] Elaiza Shober[place],  [place] [phone] or [phone]) [phone]   Information discussed with and copy given to patient by: Amada JupiterHOYLE, Keena Heesch, 11/25/2016, 11:49 AM

## 2016-11-25 NOTE — Progress Notes (Signed)
Social Work  Social Work Assessment and Plan  Patient Details  Name: Andrea [name] MRN: [id number] Date of Birth: 1950/04/18  Today's Date: 11/24/2016  Problem List:  Patient Active Problem List   Diagnosis Date Noted  . Status post transmetatarsal amputation of foot, left (HCC) 11/21/2016  . S/P transmetatarsal amputation of foot, left (HCC)   . Acute blood loss anemia   . Diabetes mellitus type 2 in nonobese (HCC)   . Benign essential HTN   . Fibromyalgia   . Postoperative pain   . 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 Atherosclerosis of native arteries of extremities with gangrene, left leg (HCC) 07/20/2016  . GERD (gastroesophageal reflux disease) 07/01/2016  . Adjustment reaction with anxiety and depression 07/01/2016  . Wound infection 06/12/2016  . CAD (coronary artery disease) of artery bypass graft 04/15/2016  . Coronary artery disease involving coronary bypass graft of native heart with angina pectoris (HCC)   . Coronary artery disease involving native coronary artery of native heart without angina pectoris 04/07/2016  . PAD (peripheral artery disease) (HCC) 03/30/2016  . Diabetic foot ulcers (HCC) 03/30/2016  . Hyperlipidemia 03/30/2016  . Diabetic neuropathy (HCC) 02/24/2016  . Tobacco abuse 02/24/2016  . Overactive bladder 01/14/2016  . Diabetes mellitus without complication (HCC) 10/18/2006  . Essential hypertension 10/18/2006  . LOW BACK PAIN, CHRONIC 10/18/2006   Past Medical History:  Past Medical History:  Diagnosis Date  . Arthritis   . Asthma   . Coronary artery disease   . Diabetes mellitus without complication (HCC)   . Dyspnea   . Fibromyalgia   . GERD (gastroesophageal reflux disease)   . High cholesterol   . Hypertension   . 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 PONV (postoperative nausea and vomiting)   . PVD (peripheral vascular disease) (HCC)   . Urinary frequency   . Urinary incontinence    Past  Surgical History:  Past Surgical History:  Procedure Laterality Date  . ABDOMINAL HYSTERECTOMY    . COLONOSCOPY    . CORONARY ARTERY BYPASS GRAFT    . FEMORAL BYPASS Left 07/20/2016   Left common femoral endarterectomy  . KNEE SURGERY    . MULTIPLE TOOTH EXTRACTIONS     Social History:  reports that she quit smoking about 7 months ago. Her smoking use included cigarettes. She smoked 0.25 packs per day. she has never used smokeless tobacco. She reports that she does not drink alcohol or use drugs.  Family / Support Systems Marital Status: Widow/Widower How Long?: 11 yrs Patient Roles: Other (Comment), Parent(has a sister) Children: Has two daughters, however, they are of little support. Other Supports: sister, Josem KaufmannBronda Coleman @ [phone] Anticipated Caregiver: sister intermittent assist at night; she works Ability/Limitations of Caregiver: Simon RheinBronda, works Engineer, structuralCaregiver Availability: Intermittent Family Dynamics: Sister describes her sister as supportive, however, she cannot provide 24/7 support if this is needed.    Social History Preferred language: English Religion: Baptist Cultural Background: NA Education: HS Read: Yes Write: Yes Employment Status: Retired Fish farm managerLegal Hisotry/Current Legal Issues: None Guardian/Conservator: None - per MD, pt is capable of making her own decisions.   Abuse/Neglect Abuse/Neglect Assessment Can Be Completed: Yes Physical Abuse: Denies Verbal Abuse: Denies Sexual Abuse: Denies Exploitation of patient/patient's resources: Denies Self-Neglect: Denies  Emotional Status Pt's affect, behavior adn adjustment status: Pt very pleasant and able to complete assessment interview without difficulty.  She denies any s/s of emotional distress related to her amputation.  She does note worry about her apartment as it is two level and she has been sleeping on the couch on the ground level.  She has little assistance in trying to find a one level apt.  Will monitor  mood and refer for neuropsychology as indicated. Recent Psychosocial Issues: Initial surgery (toe amputations) in Oct and pt was d/c'd to a SNF and then to home. Pyschiatric History: None Substance Abuse History: None  Patient / Family Perceptions, Expectations & Goals Pt/Family understanding of illness & functional limitations: Pt with basic understanding of her medical issues which have ultimately resulted in need for amputations.  Good understanding of her functional limitations/ need for CIR. Premorbid pt/family roles/activities: Pt had been fairly independent since her return home.   Sister assisting with transportation as needed.  Home, however, only a couple of days before her return. Anticipated changes in roles/activities/participation: Per goals of mod ind, team hopeful little change to roles.  If she is unable to reach this LOF, then changing assist levels will need to be discussed. Pt/family expectations/goals: "I need to be able to as much as I can for myself."  [employer] Community Agencies: None Premorbid Home Care/DME Agencies: None Transportation available at discharge: yes Resource referrals recommended: Neuropsychology, Support group (specify)  Discharge Planning Living Arrangements: Alone Support Systems: Other relatives Type of Residence: Private residence Insurance Resources: Media plannerrivate Insurance (specify)(BCBS of [place]) Financial Resources: SSD, SSI Financial Screen Referred: No Living Expenses: Psychologist, sport and exerciseent Money Management: Patient Does the patient have any problems obtaining your medications?: No Patient/Family Preliminary Plans: Pt hopeful to return to her own apartment with intermittent assist of sister Sw Barriers to Discharge: Lack of/limited family support Sw Barriers to Discharge Comments: only intermittent assist available from pt's sister Social Work Anticipated Follow Up Needs: HH/OP, Support Group Expected length of stay: 10-14 days  Clinical  Impression Pleasant woman here following additional surgery of TMA.  Recent toe amputations and had been d/c'd to a SNF.  Hopeful she can make progress here and be able to return to her 2 level apartment.  Limited family assist available which could be a concern if unable to reach mod ind goals.  Pt denies any significant emotional distress.  Will follow for support and d/c planning needs.  Kevina Piloto 11/24/2016, 11:46 AM

## 2016-11-25 NOTE — Progress Notes (Signed)
Social Work Patient ID: Andrea [name], female   DOB: 11/01/1950, 65 y.o.   MRN: [id number]   Met with pt yesterday to review team conference. She is aware and agreeable of targeted d/c date of 11/24 and mod independent goals.  Discussed need to verify home accessibility for rw which pt states she does not think it will fit through the hallways or doors.  Contacted pt's sister, Andrea [name], who confirms that walker does not fit around the apt "because there is just too much stuff."  She describes boxes and items "all over the place...  There's no way a walker will get through but she usually just holds on the things as she moves around..."  Asked if it is possible to clear some the the clutter out of the home and sister quickly states that she will not do this and there is no one else to assist either.  Have discussed this with therapies and hopeful to get pt to cane level for ambulation.  Will monitor progress.  HOYLE, LUCY, LCSW  

## 2016-11-25 NOTE — Progress Notes (Signed)
Physical Therapy Session Note  Patient Details  Name: Andrea [name] MRN: [id number] Date of Birth: 04-23-1950  Today's Date: 11/25/2016 PT Individual Time: 0800-0900 PT Individual Time Calculation (min): 60 min   Short Term Goals: Week 1:  PT Short Term Goal 1 (Week 1): Pt will ambulate x 50 ft with min assist and LRAD PT Short Term Goal 2 (Week 1): Pt will perform bed<>chair transfers with supervision PT Short Term Goal 3 (Week 1): Pt will propel w/c x 150 ft with supervision  Skilled Therapeutic Interventions/Progress Updates: Pt received supine in bed eating fruit; encouraged pt to sit up to EOB to eat; required min guard and bedrails to sit up with occasional posterior LOB, recovered with min guard. Gait with RW and min guard within room to retrieve clothing from closet and drawers; increased time required and occasional staggering when reaching into closet, requiring minA to recover. Performed upper body dressing modI, min guard lower body dressing for dynamic balance with RW. Several posterior LOBs in standing while attempting to pull up pants, with pt reaching back for arm rests and lowering herself to w/c to prevent fall. Performed oral hygiene at sink with modI from w/c. Stand pivot transfer w/c >mat table with RW and min guard. Standing balance on airex foam pad for focus on ankle strategy; several posterior LOBs initially with pt assisting herself to sit safely on mat table. Improved balance and safety reduced to S with increased time and improved ankle strategy. Gait x75' with RW and min guard; cues to maintain RW closer to body. Returned remaining distance to room totalA in w/c d/t fatigue. Remained seated in w/c at end of session, all needs in reach; therapist reinforced recommendation to call for staff for toileting and/or transfers and pt verbalizes understanding.      Therapy Documentation Precautions:  Precautions Precautions: Fall Restrictions Weight Bearing Restrictions:  No LLE Weight Bearing: Non weight bearing Other Position/Activity Restrictions: Darco shoe for left foot with weightbearing.   See Function Navigator for Current Functional Status.   Therapy/Group: Individual Therapy  Andrea [name] 11/25/2016, 9:17 AM

## 2016-11-25 NOTE — Progress Notes (Signed)
Occupational Therapy Session Note  Patient Details  Name: Elizebeth Brookingoni B Buccieri MRN: [id number] Date of Birth: 01-Mar-1950  Today's Date: 11/25/2016 OT Individual Time: 1000-1055 OT Individual Time Calculation (min): 55 min    Short Term Goals: Week 1:  OT Short Term Goal 1 (Week 1): Pt will completed toilet transfer with min A  OT Short Term Goal 2 (Week 1): Pt will completed LB dressing with CGA  OT Short Term Goal 3 (Week 1): Pt will completed grooming tasks at sink while standing with minA  OT Short Term Goal 4 (Week 1): Pt will complete shower transfers with min A   Skilled Therapeutic Interventions/Progress Updates:    Pt resting EOB upon arrival.  Pt declined bathing/dressing tasks stating that she completed tasks earlier in the AM. Pt transferred to w/c with steady A.  Pt correctly recalled WBing precautions with/without Darco shoe donned.  Pt practiced functional transfers and engaged in dynamic standing activities at high low table.  Pt required rest breaks X 5 during standing activities.  Pt returned to room and remained in w/c with all needs within reach. Pt fatigues quickly and requires multiple rest breaks during therapy session.   Therapy Documentation Precautions:  Precautions Precautions: Fall Restrictions Weight Bearing Restrictions: Yes LLE Weight Bearing: Non weight bearing(heel wt bearing ) Other Position/Activity Restrictions: Darco shoe for left foot with weightbearing. Pain:  Pt denies pain  See Function Navigator for Current Functional Status.   Therapy/Group: Individual Therapy  Rich BraveLanier, Naima Veldhuizen Chappell 11/25/2016, 12:00 PM

## 2016-11-25 NOTE — Telephone Encounter (Signed)
Sched appt 12/18/16 at 11:00. Spoke to pt.

## 2016-11-25 NOTE — Consult Note (Signed)
Neuropsychological Consultation   Patient:   Andrea [name]   DOB:   1950/05/22  MR Number:  [id number]  [facility]  [facility] [address] [id number] [place] [id number] Dept: [phone] Loc: [phone]           Date of Service:   11/25/2016  Start Time:   2 PM End Time:   3 PM   Provider/Observer:  Arley PhenixJohn Leshae Mcclay, Psy.D.       Clinical Neuropsychologist       Billing Code/Service: [phone] 4 Units  Chief Complaint:    Andrea [name] is a 66 year old right-handed female who has a history of diabetes, fibromyalgia, hypertension, CAD with CABG maintained on aspirin and Plavix, peripheral vascular disease and recent left first and second toe amputations.  These amputations occurred on 10/13/2016.  The patient presented on 11/19/2016 with gangrenous process around her surgical wong and other changes to her left foot.  Wound was not salvageable.  Transmetatarsal amputation on 11/19/2016.  The patient has had reports of significant pain and some coping issues dealing with the issues of part of foot being amputated and how she will cope going forward.    Reason for Service:  Andrea [name] was referred from neuropsychological/psychological consultation due to coping issues following left transmetatarsal amputation.  She is coping with loss but has worried about how she will cope following the surgery.  Below is the current HPI for the current admission.  Andrea [name] a 66 y.o.right handed femalediabetes mellitus, fibromyalgia, hypertension, CAD with CABG maintained on aspirin and Plavix, recently stopped smoking 7 months ago, peripheral vascular disease with recent left first and second toe amputations 10/13/2016 [facility] and recently had returned home. She lives alone was using a cane prior to admission. She has a sister in the area that works part time. 2 daughters  with very little support. 2 level home with one step to entry. Presented 11/19/2016 with surgical wound dehiscence gangrenous changes of left foot. Wound was not felt to be salvageable. Underwent left transmetatarsal amputation 11/19/2016. Hospital course pain management. Acute blood loss anemia 7.6.Subcutaneous heparin for DVT prophylaxis.Physical therapy evaluation completed M.D. has requested physical medicine rehabilitation consult.Patient was admitted for a comprehensive rehabilitation program  Current Status:  The patient reports that she is worried about her need to move and how she will be able to move and find a more appropriate place for her to live now that her mobility has been reduced.  The patient does intellectually acknowledge that she will get better post surgery and she had been but at this point she reports it is hard for her to accept both the loss of part of her foot as well as the fact that she will be able to progress and learn how to adequately ambulate when she heals.   Medical History:   Past Medical History:  Diagnosis Date  . Arthritis   . Asthma   . Coronary artery disease   . Diabetes mellitus without complication (HCC)   . Dyspnea   . Fibromyalgia   . GERD (gastroesophageal reflux disease)   . High cholesterol   . Hypertension   . 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 PONV (postoperative nausea and vomiting)   . PVD (peripheral vascular disease) (HCC)   . Urinary frequency   . Urinary incontinence     Family Med/Psych History:  Family  History  Problem Relation Age of Onset  . Stroke Mother     Impression/DX:  Andrea [name] is a 66 year old right-handed female who has a history of diabetes, fibromyalgia, hypertension, CAD with CABG maintained on aspirin and Plavix, peripheral vascular disease and recent left first and second toe amputations.  These amputations occurred on 10/13/2016.  The patient presented on 11/19/2016 with gangrenous  process around her surgical wong and other changes to her left foot.  Wound was not salvageable.  Transmetatarsal amputation on 11/19/2016.  The patient has had reports of significant pain and some coping issues dealing with the issues of part of foot being amputated and how she will cope going forward.   The patient reports that she is worried about her need to move and how she will be able to move and find a more appropriate place for her to live now that her mobility has been reduced.  The patient does intellectually acknowledge that she will get better post surgery and she had been but at this point she reports it is hard for her to accept both the loss of part of her foot as well as the fact that she will be able to progress and learn how to adequately ambulate when she heals.  Today we worked on Producer, television/film/videobuilding coping skills and strategies around adapting to recent transmetatarsal amputation.   Diagnosis:    Status post transmetatarsal amputation of foot, left (HCC) - Plan: Ambulatory referral to Physical Medicine Rehab         Electronically Signed   _______________________ Arley PhenixJohn Nathon Stefanski, Psy.D.

## 2016-11-25 NOTE — Telephone Encounter (Signed)
-----   Message from Sharee PimpleMarilyn K McChesney, RN sent at 11/24/2016 12:47 PM EST ----- Regarding: FW: Timeframe?   ----- Message ----- From: Lars Mageollins, Emma M, PA-C Sent: 11/24/2016  12:40 PM To: Sharee PimpleMarilyn K McChesney, RN Subject: RE: Timeframe?                                 2-3 weeks due to holiday ----- Message ----- From: Sharee PimpleMcChesney, Marilyn K, RN Sent: 11/20/2016   5:07 PM To: Lars MageEmma M Collins, PA-C Subject: Timeframe?                                     What timeframe?  ----- Message ----- From: Lars Mageollins, Emma M, PA-C Sent: 11/20/2016   1:59 PM To: [facility] Charge Pool  F/U with Dr. Randie Heinzain s/p left transmetatarsal amputation.

## 2016-11-25 NOTE — Progress Notes (Signed)
Hypoglycemic Event  CBG: 67  Treatment: 15 GM carbohydrate snack  Symptoms: None  Follow-up CBG: Time:0405  CBG  Result 85  Possible Reasons for Event: Change in activity  Comments/MD notified: hypoglycemic protocol iinitiated per md orders     Rhett BannisterLisa  Twyla Dais

## 2016-11-25 NOTE — Progress Notes (Signed)
Subjective/Complaints: Up in bed trying to transfer with OT. No new complaints. Sugars still somewhat low overnight.   ROS: pt denies nausea, vomiting, diarrhea, cough, shortness of breath or chest pain   Objective: Vital Signs: Blood pressure (!) 150/38, pulse 63, temperature 99.3 F (37.4 C), temperature source Oral, resp. rate 18, height 5' 3"  (1.6 m), weight 55.2 kg (121 lb 9.6 oz), SpO2 98 %. No results found. Results for orders placed or performed during the hospital encounter of 11/21/16 (from the past 72 hour(s))  Glucose, capillary     Status: None   Collection Time: 11/22/16 12:02 PM  Result Value Ref Range   Glucose-Capillary 98 65 - 99 mg/dL  Glucose, capillary     Status: None   Collection Time: 11/22/16  4:24 PM  Result Value Ref Range   Glucose-Capillary 69 65 - 99 mg/dL  Glucose, capillary     Status: None   Collection Time: 11/22/16  4:47 PM  Result Value Ref Range   Glucose-Capillary 68 65 - 99 mg/dL   Comment 1 Notify RN   Glucose, capillary     Status: Abnormal   Collection Time: 11/22/16  5:28 PM  Result Value Ref Range   Glucose-Capillary 118 (H) 65 - 99 mg/dL  Glucose, capillary     Status: Abnormal   Collection Time: 11/22/16  9:34 PM  Result Value Ref Range   Glucose-Capillary 107 (H) 65 - 99 mg/dL  Glucose, capillary     Status: Abnormal   Collection Time: 11/23/16  6:23 AM  Result Value Ref Range   Glucose-Capillary 100 (H) 65 - 99 mg/dL  CBC WITH DIFFERENTIAL     Status: Abnormal   Collection Time: 11/23/16  8:23 AM  Result Value Ref Range   WBC 8.0 4.0 - 10.5 K/uL   RBC 3.07 (L) 3.87 - 5.11 MIL/uL   Hemoglobin 8.5 (L) 12.0 - 15.0 g/dL   HCT 28.2 (L) 36.0 - 46.0 %   MCV 91.9 78.0 - 100.0 fL   MCH 27.7 26.0 - 34.0 pg   MCHC 30.1 30.0 - 36.0 g/dL   RDW 15.2 11.5 - 15.5 %   Platelets 293 150 - 400 K/uL   Neutrophils Relative % 66 %   Neutro Abs 5.3 1.7 - 7.7 K/uL   Lymphocytes Relative 26 %   Lymphs Abs 2.1 0.7 - 4.0 K/uL   Monocytes  Relative 4 %   Monocytes Absolute 0.3 0.1 - 1.0 K/uL   Eosinophils Relative 4 %   Eosinophils Absolute 0.3 0.0 - 0.7 K/uL   Basophils Relative 0 %   Basophils Absolute 0.0 0.0 - 0.1 K/uL  Comprehensive metabolic panel     Status: Abnormal   Collection Time: 11/23/16  8:23 AM  Result Value Ref Range   Sodium 137 135 - 145 mmol/L   Potassium 5.0 3.5 - 5.1 mmol/L   Chloride 100 (L) 101 - 111 mmol/L   CO2 28 22 - 32 mmol/L   Glucose, Bld 181 (H) 65 - 99 mg/dL   BUN 31 (H) 6 - 20 mg/dL   Creatinine, Ser 1.00 0.44 - 1.00 mg/dL   Calcium 8.7 (L) 8.9 - 10.3 mg/dL   Total Protein 6.8 6.5 - 8.1 g/dL   Albumin 2.7 (L) 3.5 - 5.0 g/dL   AST 13 (L) 15 - 41 U/L   ALT 12 (L) 14 - 54 U/L   Alkaline Phosphatase 62 38 - 126 U/L   Total Bilirubin 0.7 0.3 - 1.2 mg/dL  GFR calc non Af Amer 59 (L) >60 mL/min   GFR calc Af Amer >60 >60 mL/min    Comment: (NOTE) The eGFR has been calculated using the CKD EPI equation. This calculation has not been validated in all clinical situations. eGFR's persistently <60 mL/min signify possible Chronic Kidney Disease.    Anion gap 9 5 - 15  Glucose, capillary     Status: Abnormal   Collection Time: 11/23/16 12:03 PM  Result Value Ref Range   Glucose-Capillary 100 (H) 65 - 99 mg/dL   Comment 1 Notify RN   Glucose, capillary     Status: Abnormal   Collection Time: 11/23/16  5:00 PM  Result Value Ref Range   Glucose-Capillary 117 (H) 65 - 99 mg/dL   Comment 1 Notify RN   Glucose, capillary     Status: None   Collection Time: 11/23/16  8:58 PM  Result Value Ref Range   Glucose-Capillary 69 65 - 99 mg/dL  Glucose, capillary     Status: None   Collection Time: 11/23/16  9:59 PM  Result Value Ref Range   Glucose-Capillary 71 65 - 99 mg/dL   Comment 1 Notify RN   Glucose, capillary     Status: Abnormal   Collection Time: 11/24/16  3:26 AM  Result Value Ref Range   Glucose-Capillary 55 (L) 65 - 99 mg/dL   Comment 1 Notify RN   Glucose, capillary      Status: Abnormal   Collection Time: 11/24/16  3:53 AM  Result Value Ref Range   Glucose-Capillary 58 (L) 65 - 99 mg/dL  Glucose, capillary     Status: None   Collection Time: 11/24/16  4:20 AM  Result Value Ref Range   Glucose-Capillary 91 65 - 99 mg/dL   Comment 1 Notify RN   Glucose, capillary     Status: Abnormal   Collection Time: 11/24/16  6:37 AM  Result Value Ref Range   Glucose-Capillary 143 (H) 65 - 99 mg/dL   Comment 1 Notify RN   Glucose, capillary     Status: Abnormal   Collection Time: 11/24/16 11:39 AM  Result Value Ref Range   Glucose-Capillary 115 (H) 65 - 99 mg/dL  Glucose, capillary     Status: Abnormal   Collection Time: 11/24/16  5:05 PM  Result Value Ref Range   Glucose-Capillary 125 (H) 65 - 99 mg/dL  Glucose, capillary     Status: Abnormal   Collection Time: 11/24/16  8:37 PM  Result Value Ref Range   Glucose-Capillary 100 (H) 65 - 99 mg/dL  Glucose, capillary     Status: None   Collection Time: 11/24/16 10:15 PM  Result Value Ref Range   Glucose-Capillary 82 65 - 99 mg/dL  Glucose, capillary     Status: None   Collection Time: 11/25/16  3:25 AM  Result Value Ref Range   Glucose-Capillary 67 65 - 99 mg/dL  Glucose, capillary     Status: None   Collection Time: 11/25/16  4:05 AM  Result Value Ref Range   Glucose-Capillary 85 65 - 99 mg/dL  Glucose, capillary     Status: None   Collection Time: 11/25/16  6:20 AM  Result Value Ref Range   Glucose-Capillary 82 65 - 99 mg/dL     HEENT: normal Cardio: RRR without murmur. No JVD  Resp: CTA Bilaterally without wheezes or rales. Normal effort  GI: BS positive and Nontender, nondistended Extremity:  No Edema Skin:   Other Left TMA wrapped with  postoperative dressing and Ace wrap Neuro: Alert/Oriented, Abnormal Sensory Reduced light touch sensation right foot and Abnormal Motor 5/5 bilateral deltoids, biceps, triceps, grip 4/5 in the right hip flexion, knee extensor, 3 minus, ankle dorsal flexor 3 minus  to flexion and extension, 4 minus. Left hip flexion, knee extension Musc/Skel:  Extremity tender Tenderness over TMA site as expected, foot intrinsic atrophy on the right side Gen. no acute distress   Assessment/Plan: 1. Functional deficits secondary to left TMA, nonweightbearing which require 3+ hours per day of interdisciplinary therapy in a comprehensive inpatient rehab setting. Physiatrist is providing close team supervision and 24 hour management of active medical problems listed below. Physiatrist and rehab team continue to assess barriers to discharge/monitor patient progress toward functional and medical goals. FIM: Function - Bathing Position: Wheelchair/chair at sink Body parts bathed by patient: Right arm, Left arm, Chest, Right upper leg, Left upper leg, Right lower leg, Left lower leg, Abdomen, Front perineal area, Buttocks Body parts bathed by helper: Back Assist Level: Touching or steadying assistance(Pt > 75%)  Function- Upper Body Dressing/Undressing What is the patient wearing?: Pull over shirt/dress Bra - Perfomed by patient: Thread/unthread right bra strap, Thread/unthread left bra strap, Hook/unhook bra (pull down sports bra) Pull over shirt/dress - Perfomed by patient: Thread/unthread right sleeve, Thread/unthread left sleeve, Put head through opening, Pull shirt over trunk Assist Level: Supervision or verbal cues Set up : To obtain clothing/put away Function - Lower Body Dressing/Undressing What is the patient wearing?: Pants, Shoes, Underwear Position: Wheelchair/chair at sink Underwear - Performed by patient: Thread/unthread right underwear leg, Thread/unthread left underwear leg, Pull underwear up/down Pants- Performed by patient: Thread/unthread right pants leg, Thread/unthread left pants leg, Fasten/unfasten pants, Pull pants up/down Non-skid slipper socks- Performed by patient: Don/doff right sock Shoes - Performed by patient: Don/doff left shoe, Don/doff  right shoe Shoes - Performed by helper: Don/doff left shoe(DARCO) Assist for footwear: Supervision/touching assist Assist for lower body dressing: Touching or steadying assistance (Pt > 75%)  Function - Toileting Toileting steps completed by patient: Adjust clothing prior to toileting, Performs perineal hygiene, Adjust clothing after toileting Toileting Assistive Devices: Grab bar or rail Assist level: Touching or steadying assistance (Pt.75%)  Function - Air cabin crew transfer assistive device: Grab bar, Walker Assist level to toilet: Touching or steadying assistance (Pt > 75%) Assist level from toilet: Touching or steadying assistance (Pt > 75%)  Function - Chair/bed transfer Chair/bed transfer method: Ambulatory Chair/bed transfer assist level: Touching or steadying assistance (Pt > 75%) Chair/bed transfer assistive device: Armrests, Walker Chair/bed transfer details: Verbal cues for precautions/safety, Verbal cues for safe use of DME/AE  Function - Locomotion: Wheelchair Will patient use wheelchair at discharge?: Yes Type: Manual Max wheelchair distance: 150 ft(using B UEs) Assist Level: Touching or steadying assistance (Pt > 75%) Assist Level: Touching or steadying assistance (Pt > 75%) Assist Level: Touching or steadying assistance (Pt > 75%) Turns around,maneuvers to table,bed, and toilet,negotiates 3% grade,maneuvers on rugs and over doorsills: No Function - Locomotion: Ambulation Assistive device: Walker-rolling, Other (comment)(DARCO) Max distance: 44f Assist level: Touching or steadying assistance (Pt > 75%) Assist level: Touching or steadying assistance (Pt > 75%) Walk 50 feet with 2 turns activity did not occur: Safety/medical concerns Assist level: Touching or steadying assistance (Pt > 75%) Walk 150 feet activity did not occur: Safety/medical concerns Walk 10 feet on uneven surfaces activity did not occur: Safety/medical concerns  Function -  Comprehension Comprehension: Auditory Comprehension assist level: Follows complex conversation/direction with no  assist  Function - Expression Expression: Verbal Expression assist level: Expresses complex ideas: With no assist  Function - Social Interaction Social Interaction assist level: Interacts appropriately 90% of the time - Needs monitoring or encouragement for participation or interaction.  Function - Problem Solving Problem solving assist level: Solves complex 90% of the time/cues < 10% of the time  Function - Memory Memory assist level: Recognizes or recalls 90% of the time/requires cueing < 10% of the time Patient normally able to recall (first 3 days only): Current season, Staff names and faces, That he or she is in a hospital Medical Problem List and Plan: 1.Decreased functional mobilitysecondary to left transmetatarsal amputation 11/19/2016 after recent left first and second toe amputation with gangrenous changes  -continue therapies 2. DVT Prophylaxis/Anticoagulation: Subcutaneous heparin. Monitor for any bleeding episodes 3. Pain Management:Cymbalta 60 mg daily, Neurontin 300 mg 3 times a day, hydrocodone as needed, pain is adequately controlled at the current time  4. Mood:Provide emotional support 5. Neuropsych: This patientiscapable of making decisions on herown behalf. 6. Skin/Wound Care:Routine skin checks 7. Fluids/Electrolytes/Nutrition:Routine I&O's with follow-up chemistries 8.Acute blood loss anemia. Follow-up CBC,  hemoglobin on 11/21/2016 was 8.0, hemoglobin 8.5 on 11/23/2016.  We will continue to monitor 9.Diabetes mellitus peripheral neuropathy hemoglobin A1c 7.3. 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 Continue aspirin and Plavix. No chest pain or shortness of breath 12.Hyperlipidemia. Lipitor 13.Tobacco abuse. Patient recently stopped smoking 7 months ago. Provide counseling  LOS (Days) 4 A FACE TO FACE EVALUATION WAS PERFORMED  Brennyn Haisley T 11/25/2016, 9:03 AM

## 2016-11-25 NOTE — Patient Care Conference (Signed)
Inpatient RehabilitationTeam Conference and Plan of Care Update Date: 11/24/2016   Time: 11:15 AM    Patient Name: Andrea [name]      Medical Record Number: [id number]  Date of Birth: 08/28/1950 Sex: Female         Room/Bed: 4W09C/4W09C-01 Payor Info: Payor: BLUE CROSS BLUE SHIELD / Plan: BCBS OTHER / Product Type: *No Product type* /    Admitting Diagnosis: Transmet   Admit Date/Time:  11/21/2016  3:37 PM Admission Comments: No comment available   Primary Diagnosis:  <principal problem not specified> Principal Problem: <principal problem not specified>  Patient Active Problem List   Diagnosis Date Noted  . Status post transmetatarsal amputation of foot, left (HCC) 11/21/2016  . S/P transmetatarsal amputation of foot, left (HCC)   . Acute blood loss anemia   . Diabetes mellitus type 2 in nonobese (HCC)   . Benign essential HTN   . Fibromyalgia   . Postoperative pain   . 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 Atherosclerosis of native arteries of extremities with gangrene, left leg (HCC) 07/20/2016  . GERD (gastroesophageal reflux disease) 07/01/2016  . Adjustment reaction with anxiety and depression 07/01/2016  . Wound infection 06/12/2016  . CAD (coronary artery disease) of artery bypass graft 04/15/2016  . Coronary artery disease involving coronary bypass graft of native heart with angina pectoris (HCC)   . Coronary artery disease involving native coronary artery of native heart without angina pectoris 04/07/2016  . PAD (peripheral artery disease) (HCC) 03/30/2016  . Diabetic foot ulcers (HCC) 03/30/2016  . Hyperlipidemia 03/30/2016  . Diabetic neuropathy (HCC) 02/24/2016  . Tobacco abuse 02/24/2016  . Overactive bladder 01/14/2016  . Diabetes mellitus without complication (HCC) 10/18/2006  . Essential hypertension 10/18/2006  . LOW BACK PAIN, CHRONIC 10/18/2006    Expected Discharge Date: Expected Discharge Date: 12/05/16  Team Members  Present: Physician leading conference: Dr. Maryla MorrowAnkit Patel Social Worker Present: Amada JupiterLucy Naleah Kofoed, LCSW Nurse Present: Chrissie NoaMelanie Barnes, RN PT Present: Alyson ReedyElizabeth Tygielski, PT OT Present: Roney MansJennifer Smith, OT PPS Coordinator present : Tora DuckMarie Noel, RN, CRRN     Current Status/Progress Goal Weekly Team Focus  Medical   Patient with labile blood sugars, hypoglycemia yesterday, pain after transmetatarsal amputation well controlled.  Maintain medical stability during rehabilitation stay, adequate pain control and diabetic management  Adjust diabetic medications   Bowel/Bladder   Continent of bowel and bladder. Last BM 11/12.  Remain continent of both.  Assess q shift and prn for function of bowel and bladder.   Swallow/Nutrition/ Hydration             ADL's   steady A overall   mod I overall   home d/c planning, functional ambulation with RW with [facility] boot.   Mobility   overall min assist  mod I transfers and household gait; supervision stairs  gait training, endurance, strengthening, balance, stairs, d/c planning   Communication             Safety/Cognition/ Behavioral Observations            Pain   Denies pain this shift.  Pain less than or equal to 2.  Monitor for pain and treat prn.   Skin   Skin clean dry and intact. L transmetatarsal amputation with staples. Dressing CDI. Rt. second toe scab.  No skin breakdown while in rehab.  Monitor skin q shift and prn.    Rehab Goals Patient on target to meet rehab goals: Yes *See Care Plan and progress  notes for long and short-term goals.     Barriers to Discharge  Current Status/Progress Possible Resolutions Date Resolved   Physician    Medical stability;Weight bearing restrictions;Wound Care     Working on diabetic control  Orders written for medication management      Nursing  Incontinence;Wound Care               PT  Lack of/limited family support  pt reports unsure if someone will be able to assist her at d/c (household management,  cooking, etc)              OT Inaccessible home environment;Decreased caregiver support;Home environment Best boyaccess/layout;Lack of/limited family support  decreased level of IND with ADLs             SLP                SW Lack of/limited family support only intermittent assist available from pt's sister            Discharge Planning/Teaching Needs:  Pt plans to return to her own apartment (note it is 2 level and she sleeps on couch and has access to 1/2 bathroom downstairs)  Teaching needs TBD   Team Discussion:  MD adjusting meds.  Overall min assist with txs;  tfs with min guard.  Goals being set for mod independent but concern she may need more assist.  Recommend slightly longer stay to reach the goals.  SW to confirm amount of support pt has at home and home accessibility.  Revisions to Treatment Plan:  None    Continued Need for Acute Rehabilitation Level of Care: The patient requires daily medical management by a physician with specialized training in physical medicine and rehabilitation for the following conditions: Daily direction of a multidisciplinary physical rehabilitation program to ensure safe treatment while eliciting the highest outcome that is of practical value to the patient.: Yes Daily medical management of patient stability for increased activity during participation in an intensive rehabilitation regime.: Yes Daily analysis of laboratory values and/or radiology reports with any subsequent need for medication adjustment of medical intervention for : Diabetes problems;Wound care problems;Post surgical problems  Kaylea Mounsey 11/25/2016, 2:22 PM

## 2016-11-26 ENCOUNTER — Inpatient Hospital Stay (HOSPITAL_COMMUNITY): Payer: BLUE CROSS/BLUE SHIELD

## 2016-11-26 ENCOUNTER — Inpatient Hospital Stay (HOSPITAL_COMMUNITY): Payer: BLUE CROSS/BLUE SHIELD | Admitting: Occupational Therapy

## 2016-11-26 ENCOUNTER — Inpatient Hospital Stay (HOSPITAL_COMMUNITY): Payer: BLUE CROSS/BLUE SHIELD | Admitting: Physical Therapy

## 2016-11-26 LAB — COMPREHENSIVE METABOLIC PANEL
ALT: 51 U/L (ref 14–54)
AST: 29 U/L (ref 15–41)
Albumin: 2.8 g/dL — ABNORMAL LOW (ref 3.5–5.0)
Alkaline Phosphatase: 62 U/L (ref 38–126)
Anion gap: 10 (ref 5–15)
BILIRUBIN TOTAL: 0.4 mg/dL (ref 0.3–1.2)
BUN: 34 mg/dL — AB (ref 6–20)
CO2: 24 mmol/L (ref 22–32)
CREATININE: 1.12 mg/dL — AB (ref 0.44–1.00)
Calcium: 8.7 mg/dL — ABNORMAL LOW (ref 8.9–10.3)
Chloride: 106 mmol/L (ref 101–111)
GFR, EST AFRICAN AMERICAN: 59 mL/min — AB (ref >60)
GFR, EST NON AFRICAN AMERICAN: 51 mL/min — AB (ref >60)
Glucose, Bld: 168 mg/dL — ABNORMAL HIGH (ref 65–99)
Potassium: 4.6 mmol/L (ref 3.5–5.1)
Sodium: 140 mmol/L (ref 135–145)
TOTAL PROTEIN: 6.6 g/dL (ref 6.5–8.1)

## 2016-11-26 LAB — CBC
HCT: 27.1 % — ABNORMAL LOW (ref 36.0–46.0)
Hemoglobin: 8.1 g/dL — ABNORMAL LOW (ref 12.0–15.0)
MCH: 27.5 pg (ref 26.0–34.0)
MCHC: 29.9 g/dL — ABNORMAL LOW (ref 30.0–36.0)
MCV: 91.9 fL (ref 78.0–100.0)
PLATELETS: 292 10*3/uL (ref 150–400)
RBC: 2.95 MIL/uL — ABNORMAL LOW (ref 3.87–5.11)
RDW: 15 % (ref 11.5–15.5)
WBC: 7.5 10*3/uL (ref 4.0–10.5)

## 2016-11-26 LAB — GLUCOSE, CAPILLARY
GLUCOSE-CAPILLARY: 92 mg/dL (ref 65–99)
GLUCOSE-CAPILLARY: 134 mg/dL — AB (ref 65–99)
GLUCOSE-CAPILLARY: 96 mg/dL (ref 65–99)
Glucose-Capillary: 114 mg/dL — ABNORMAL HIGH (ref 65–99)

## 2016-11-26 MED ORDER — SODIUM CHLORIDE 0.45 % IV SOLN
INTRAVENOUS | Status: AC
  Administered 2016-11-26 – 2016-11-29 (×4): via INTRAVENOUS

## 2016-11-26 NOTE — Progress Notes (Signed)
Physical Therapy Session Note  Patient Details  Name: Andrea [name] MRN: [id number] Date of Birth: Nov 15, 1950  Today's Date: 11/26/2016  Short Term Goals: Week 1:  PT Short Term Goal 1 (Week 1): Pt will ambulate x 50 ft with min assist and LRAD PT Short Term Goal 2 (Week 1): Pt will perform bed<>chair transfers with supervision PT Short Term Goal 3 (Week 1): Pt will propel w/c x 150 ft with supervision  Skilled Therapeutic Interventions/Progress Updates: pt missed 30 min PT d/t fatigue; pt in bed and slow to arouse. Ultimately requested to continue resting as she felt very fatigued, despite encouragement provided by therapist to participate. Discussed with pt upcoming OT session at 11 and encouraged participation at that time.      Therapy Documentation Precautions:  Precautions Precautions: Fall Restrictions Weight Bearing Restrictions: Yes LLE Weight Bearing: Non weight bearing Other Position/Activity Restrictions: Darco shoe for left foot with weightbearing. General: PT Amount of Missed Time (min): 30 Minutes PT Missed Treatment Reason: Patient fatigue Pain: Pain Assessment Pain Assessment: No/denies pain Pain Score: 0-No pain   See Function Navigator for Current Functional Status.   Therapy/Group: Individual Therapy  Andrea [name] 11/26/2016, 10:14 AM

## 2016-11-26 NOTE — Progress Notes (Signed)
Follow up labs after orthostasis with BUN 34 and CRT 1.12.Marland Kitchen.Begin IVF 75 hr .Hold metformin with elevated CRT and hold lasix for now.Follow up labs am

## 2016-11-26 NOTE — Progress Notes (Signed)
Subjective/Complaints: Patient is lying in bed.  She denies any problems overnight.  She states her pain is controlled.  ROS: pt denies nausea, vomiting, diarrhea, cough, shortness of breath or chest pain   Objective: Vital Signs: Blood pressure (!) 140/51, pulse 74, temperature 98.4 F (36.9 C), temperature source Oral, resp. rate 16, height 5\' 3"  (1.6 m), weight 55.2 kg (121 lb 9.6 oz), SpO2 98 %. No results found. Results for orders placed or performed during the hospital encounter of 11/21/16 (from the past 72 hour(s))  Glucose, capillary     Status: Abnormal   Collection Time: 11/23/16 12:03 PM  Result Value Ref Range   Glucose-Capillary 100 (H) 65 - 99 mg/dL   Comment 1 Notify RN   Glucose, capillary     Status: Abnormal   Collection Time: 11/23/16  5:00 PM  Result Value Ref Range   Glucose-Capillary 117 (H) 65 - 99 mg/dL   Comment 1 Notify RN   Glucose, capillary     Status: None   Collection Time: 11/23/16  8:58 PM  Result Value Ref Range   Glucose-Capillary 69 65 - 99 mg/dL  Glucose, capillary     Status: None   Collection Time: 11/23/16  9:59 PM  Result Value Ref Range   Glucose-Capillary 71 65 - 99 mg/dL   Comment 1 Notify RN   Glucose, capillary     Status: Abnormal   Collection Time: 11/24/16  3:26 AM  Result Value Ref Range   Glucose-Capillary 55 (L) 65 - 99 mg/dL   Comment 1 Notify RN   Glucose, capillary     Status: Abnormal   Collection Time: 11/24/16  3:53 AM  Result Value Ref Range   Glucose-Capillary 58 (L) 65 - 99 mg/dL  Glucose, capillary     Status: None   Collection Time: 11/24/16  4:20 AM  Result Value Ref Range   Glucose-Capillary 91 65 - 99 mg/dL   Comment 1 Notify RN   Glucose, capillary     Status: Abnormal   Collection Time: 11/24/16  6:37 AM  Result Value Ref Range   Glucose-Capillary 143 (H) 65 - 99 mg/dL   Comment 1 Notify RN   Glucose, capillary     Status: Abnormal   Collection Time: 11/24/16 11:39 AM  Result Value Ref Range    Glucose-Capillary 115 (H) 65 - 99 mg/dL  Glucose, capillary     Status: Abnormal   Collection Time: 11/24/16  5:05 PM  Result Value Ref Range   Glucose-Capillary 125 (H) 65 - 99 mg/dL  Glucose, capillary     Status: Abnormal   Collection Time: 11/24/16  8:37 PM  Result Value Ref Range   Glucose-Capillary 100 (H) 65 - 99 mg/dL  Glucose, capillary     Status: None   Collection Time: 11/24/16 10:15 PM  Result Value Ref Range   Glucose-Capillary 82 65 - 99 mg/dL  Glucose, capillary     Status: None   Collection Time: 11/25/16  3:25 AM  Result Value Ref Range   Glucose-Capillary 67 65 - 99 mg/dL  Glucose, capillary     Status: None   Collection Time: 11/25/16  4:05 AM  Result Value Ref Range   Glucose-Capillary 85 65 - 99 mg/dL  Glucose, capillary     Status: None   Collection Time: 11/25/16  6:20 AM  Result Value Ref Range   Glucose-Capillary 82 65 - 99 mg/dL  Glucose, capillary     Status: Abnormal   Collection  Time: 11/25/16 12:25 PM  Result Value Ref Range   Glucose-Capillary 111 (H) 65 - 99 mg/dL  Glucose, capillary     Status: Abnormal   Collection Time: 11/25/16  4:43 PM  Result Value Ref Range   Glucose-Capillary 119 (H) 65 - 99 mg/dL  Glucose, capillary     Status: Abnormal   Collection Time: 11/25/16  8:57 PM  Result Value Ref Range   Glucose-Capillary 224 (H) 65 - 99 mg/dL  Glucose, capillary     Status: None   Collection Time: 11/26/16  6:38 AM  Result Value Ref Range   Glucose-Capillary 92 65 - 99 mg/dL     HEENT: normal Cardio: RRR without murmur. No JVD   Resp: CTA Bilaterally without wheezes or rales. Normal effort   GI: BS positive and Nontender, nondistended Extremity:  No Edema Skin:   Left TMA incision is clean with sutures, minimal serosanguineous drainage is noted.  Wound is intact Neuro: Alert/Oriented, Abnormal Sensory Reduced light touch sensation right foot and Abnormal Motor 5/5 bilateral deltoids, biceps, triceps, grip 4/5 in the right hip  flexion, knee extensor, 3 minus, ankle dorsal flexor 3 minus to flexion and extension, 4 minus. Left hip flexion, knee extension--stable Musc/Skel: Left foot tenderness. Gen. no acute distress   Assessment/Plan: 1. Functional deficits secondary to left TMA, nonweightbearing which require 3+ hours per day of interdisciplinary therapy in a comprehensive inpatient rehab setting. Physiatrist is providing close team supervision and 24 hour management of active medical problems listed below. Physiatrist and rehab team continue to assess barriers to discharge/monitor patient progress toward functional and medical goals. FIM: Function - Bathing Position: Wheelchair/chair at sink Body parts bathed by patient: Right arm, Left arm, Chest, Right upper leg, Left upper leg, Right lower leg, Left lower leg, Abdomen, Front perineal area, Buttocks Body parts bathed by helper: Back Assist Level: Touching or steadying assistance(Pt > 75%)  Function- Upper Body Dressing/Undressing What is the patient wearing?: Pull over shirt/dress Bra - Perfomed by patient: Thread/unthread right bra strap, Thread/unthread left bra strap, Hook/unhook bra (pull down sports bra) Pull over shirt/dress - Perfomed by patient: Thread/unthread right sleeve, Thread/unthread left sleeve, Put head through opening, Pull shirt over trunk Assist Level: Supervision or verbal cues Set up : To obtain clothing/put away Function - Lower Body Dressing/Undressing What is the patient wearing?: Pants, Shoes, Underwear Position: Wheelchair/chair at sink Underwear - Performed by patient: Thread/unthread right underwear leg, Thread/unthread left underwear leg, Pull underwear up/down Pants- Performed by patient: Thread/unthread right pants leg, Thread/unthread left pants leg, Fasten/unfasten pants, Pull pants up/down Non-skid slipper socks- Performed by patient: Don/doff right sock Shoes - Performed by patient: Don/doff left shoe, Don/doff right  shoe Shoes - Performed by helper: Don/doff left shoe(DARCO) Assist for footwear: Supervision/touching assist Assist for lower body dressing: Touching or steadying assistance (Pt > 75%)  Function - Toileting Toileting steps completed by patient: Adjust clothing prior to toileting, Performs perineal hygiene, Adjust clothing after toileting Toileting Assistive Devices: Grab bar or rail Assist level: Touching or steadying assistance (Pt.75%)  Function - Archivist transfer assistive device: Grab bar, Walker Assist level to toilet: Touching or steadying assistance (Pt > 75%) Assist level from toilet: Touching or steadying assistance (Pt > 75%)  Function - Chair/bed transfer Chair/bed transfer method: Ambulatory Chair/bed transfer assist level: Touching or steadying assistance (Pt > 75%) Chair/bed transfer assistive device: Armrests, Walker Chair/bed transfer details: Verbal cues for precautions/safety, Verbal cues for safe use of DME/AE  Function -  Locomotion: Wheelchair Will patient use wheelchair at discharge?: Yes Type: Manual Max wheelchair distance: 150 ft(using B UEs) Assist Level: Touching or steadying assistance (Pt > 75%) Assist Level: Touching or steadying assistance (Pt > 75%) Assist Level: Touching or steadying assistance (Pt > 75%) Turns around,maneuvers to table,bed, and toilet,negotiates 3% grade,maneuvers on rugs and over doorsills: No Function - Locomotion: Ambulation Assistive device: Walker-rolling, Other (comment)(DARCO) Max distance: 75 Assist level: Touching or steadying assistance (Pt > 75%) Assist level: Touching or steadying assistance (Pt > 75%) Walk 50 feet with 2 turns activity did not occur: Safety/medical concerns Assist level: Touching or steadying assistance (Pt > 75%) Walk 150 feet activity did not occur: Safety/medical concerns Walk 10 feet on uneven surfaces activity did not occur: Safety/medical concerns  Function -  Comprehension Comprehension: Auditory Comprehension assist level: Follows complex conversation/direction with no assist  Function - Expression Expression: Verbal Expression assist level: Expresses complex ideas: With no assist  Function - Social Interaction Social Interaction assist level: Interacts appropriately 90% of the time - Needs monitoring or encouragement for participation or interaction.  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 DVT Prophylaxis/Anticoagulation: Subcutaneous heparin. Monitor for any bleeding episodes 3. Pain Management:Cymbalta 60 mg daily, Neurontin 300 mg 3 times a day, hydrocodone as needed  -Pain is controlled at present  4. Mood:Provide emotional support 5. Neuropsych: This patientiscapable of making decisions on herown behalf. 6. Skin/Wound Care:Routine skin checks 7. Fluids/Electrolytes/Nutrition:Routine I&O's with follow-up chemistries 8.Acute blood loss anemia. Follow-up CBC,  hemoglobin on 11/21/2016 was 8.0, hemoglobin 8.5 on 11/23/2016.  We will continue to monitor 9.Diabetes mellitus peripheral neuropathy hemoglobin A1c 7.3. Lantus insulin 30 units daily at bedtime, Glucophage 1000 mg twice a day.Diabetic teaching Reduced Lantus to 25 units nightly  -At bedtime CBG was elevated last evening.  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 Continue aspirin and Plavix. No chest pain or shortness of breath 12.Hyperlipidemia. Lipitor 13.Tobacco abuse. Patient recently stopped smoking 7 months ago. Provide counseling  LOS (Days) 5 A FACE TO FACE EVALUATION WAS PERFORMED  SWARTZ,ZACHARY T 11/26/2016, 8:57 AM

## 2016-11-26 NOTE — Progress Notes (Signed)
Pt not feeling well, b/p low, SAT 85% RA, returned with Shannon and SAT 99 to 100%RA. BP still low, HR in 50's was 103 while up in chair, no fever, no c/o pain or SOB, pt rtn to bed, notified PAC. New orders received. Will call PAC back with results.

## 2016-11-26 NOTE — Progress Notes (Signed)
Labs resulted: Andrea [name]. New orders for 0.45% NS at 10675ml/hr continuous, hold Metformin, order CMET in AM for PAC to review to see if Metformin to be administered.  Hbg has dropped to 8.1.

## 2016-11-26 NOTE — Progress Notes (Signed)
Physical Therapy Session Note  Patient Details  Name: Andrea [name] B Malenfant MRN: [id number] Date of Birth: 1950/11/20  Today's Date: 11/26/2016 PT Individual Time: 1400-1500 PT Individual Time Calculation (min): 60 min   Short Term Goals: Week 1:  PT Short Term Goal 1 (Week 1): Pt will ambulate x 50 ft with min assist and LRAD PT Short Term Goal 2 (Week 1): Pt will perform bed<>chair transfers with supervision PT Short Term Goal 3 (Week 1): Pt will propel w/c x 150 ft with supervision  Skilled Therapeutic Interventions/Progress Updates:    Patient in supine and assist with dressing in bed min A for lower body and set up for upper body.  Patient sit to stand min guard with SPC and ambulated in room to brush teeth and comb hair at sink min guard and standing activities S with one hand supported on sink.  Patient propelled w/c with bilat UE and LE to therapy gym.  Gait with SPC and min A x 60' x 2, one LOB mod A recovery.  Patient ambulated S with RW x 60' x 2 without LOB.  Negotiated 4 stairs with rail and cane, then both rails with min A and mod cues for sequencing, some difficulty descending due to uncontrolled descent.  Patient propelled to dayroom with bilat UE/LE and transferred to Nu Step performed 3:30 at level 1 for endurance training/LE strengthening.  Returned to room in w/c and left in chair with all needs in reach.   Therapy Documentation Precautions:  Precautions Precautions: Fall Restrictions Weight Bearing Restrictions: Yes LLE Weight Bearing: Non weight bearing Other Position/Activity Restrictions: Darco shoe for left foot with weightbearing.   Pain: Pain Assessment Faces Pain Scale: Hurts little more Pain Type: Chronic pain Pain Location: Leg Pain Orientation: Right;Left Pain Descriptors / Indicators: Aching Pain Onset: With Activity Pain Intervention(s): Rest    Therapy/Group: Individual Therapy  Elray McgregorCynthia Kelsie Kramp 11/26/2016, 2:35 PM

## 2016-11-26 NOTE — Progress Notes (Signed)
Occupational Therapy Session Note  Patient Details  Name: Andrea [name] MRN: [id number] Date of Birth: [birth year]  Today's Date: 11/26/2016 OT Individual Time: [id number] OT Individual Time Calculation (min): 27 min  and Today's Date: 11/26/2016 OT Missed Time: 33 Minutes Missed Time Reason: Patient fatigue   Short Term Goals: Week 1:  OT Short Term Goal 1 (Week 1): Pt will completed toilet transfer with min A  OT Short Term Goal 2 (Week 1): Pt will completed LB dressing with CGA  OT Short Term Goal 3 (Week 1): Pt will completed grooming tasks at sink while standing with minA  OT Short Term Goal 4 (Week 1): Pt will complete shower transfers with min A   Skilled Therapeutic Interventions/Progress Updates:    Upon entering the room, pt seated in wheelchair with NT obtaining vitals. Pt O2 saturation at 85% and HR 130. RN notified and going to obtain [place]. However, pt's O2 returning to 99% and HR dropping back down into 50's before RN return. Pt is very tearful and reports, " I just don't feel well." OT assisted pt with doffing clothing and donning nightgown with set up A. Pt standing with steady assistance and RW to doff pants. Pt transferred from wheelchair >bed with steady assistance. Pt removed darco shoe with close supervision. Call bell and all needed items within reach.   Therapy Documentation Precautions:  Precautions Precautions: Fall Restrictions Weight Bearing Restrictions: Yes LLE Weight Bearing: Non weight bearing Other Position/Activity Restrictions: Darco shoe for left foot with weightbearing. General: General OT Amount of Missed Time: 33 Minutes Vital Signs: Therapy Vitals Temp: 98.1 F (36.7 C) Temp Source: Oral Pulse Rate: (!) 56 BP: (!) 92/43 Patient Position (if appropriate): Sitting Oxygen Therapy SpO2: 100 % Pain: Pain Assessment Faces Pain Scale: Hurts little more Pain Type: Chronic pain Pain Location: Leg Pain Orientation: Right;Left Pain  Descriptors / Indicators: Aching Pain Onset: With Activity Pain Intervention(s): Rest ADL: ADL Eating: Independent Where Assessed-Eating: Wheelchair Grooming: Setup Where Assessed-Grooming: Sitting at sink Upper Body Bathing: Supervision/safety, Minimal assistance Where Assessed-Upper Body Bathing: Shower Lower Body Bathing: Moderate assistance Where Assessed-Lower Body Bathing: Shower Upper Body Dressing: Setup Where Assessed-Upper Body Dressing: Wheelchair Lower Body Dressing: Minimal assistance Where Assessed-Lower Body Dressing: Wheelchair Toileting: Moderate assistance Where Assessed-Toileting: Teacher, adult educationToilet Toilet Transfer: CuratorMinimal assistance Toilet Transfer Method: Surveyor, mineralstand pivot Toilet Transfer Equipment: Bedside commode, Grab bars Tub/Shower Transfer: Minimal Radiation protection practitionerassistance Tub/Shower Transfer Method: Stand pivot Tub/Shower Equipment: Grab bars, Walk in shower, Shower seat with back Film/video editorWalk-In Shower Transfer: Minimal assistance Film/video editorWalk-In Shower Transfer Method: Warden/rangertand pivot Walk-In Shower Equipment: Grab bars, Information systems managerhower seat with back Clinical research associateVision   Perception    Praxis   Exercises:   Other Treatments:    See Function Navigator for Current Functional Status.   Therapy/Group: Individual Therapy  Alen BleacherBradsher, Ariel Wingrove P 11/26/2016, 3:52 PM

## 2016-11-26 NOTE — Progress Notes (Signed)
Potassium held this am. Lisbeth Renshawalled Dan Anguilli, PAC, last lab drawn on 12th, value was 5.0. New order to d/c potassium.

## 2016-11-26 NOTE — Progress Notes (Signed)
Occupational Therapy Note  Patient Details  Name: Andrea [name] MRN: [id number] Date of Birth: January 06, 1951  Today's Date: 11/26/2016 OT Missed Time: 60 Minutes Missed Time Reason: Patient fatigue  Pt asleep in bed upon arrival and required mod verbal cues to arouse.  Pt c/o increased fatigue and stated she could not participate in therapy "right now." Pt missed 60 mins skilled services.   Lavone NeriLanier, Kimberlin Scheel [facility] CenterChappell 11/26/2016, 11:47 AM

## 2016-11-27 ENCOUNTER — Inpatient Hospital Stay (HOSPITAL_COMMUNITY): Payer: BLUE CROSS/BLUE SHIELD | Admitting: Occupational Therapy

## 2016-11-27 ENCOUNTER — Inpatient Hospital Stay (HOSPITAL_COMMUNITY): Payer: BLUE CROSS/BLUE SHIELD

## 2016-11-27 ENCOUNTER — Inpatient Hospital Stay (HOSPITAL_COMMUNITY): Payer: BLUE CROSS/BLUE SHIELD | Admitting: Physical Therapy

## 2016-11-27 DIAGNOSIS — R7989 Other specified abnormal findings of blood chemistry: Secondary | ICD-10-CM

## 2016-11-27 DIAGNOSIS — R55 Syncope and collapse: Secondary | ICD-10-CM

## 2016-11-27 DIAGNOSIS — R42 Dizziness and giddiness: Secondary | ICD-10-CM

## 2016-11-27 LAB — GLUCOSE, CAPILLARY
GLUCOSE-CAPILLARY: 105 mg/dL — AB (ref 65–99)
GLUCOSE-CAPILLARY: 163 mg/dL — AB (ref 65–99)
GLUCOSE-CAPILLARY: 118 mg/dL — AB (ref 65–99)
GLUCOSE-CAPILLARY: 176 mg/dL — AB (ref 65–99)
Glucose-Capillary: 163 mg/dL — ABNORMAL HIGH (ref 65–99)

## 2016-11-27 LAB — COMPREHENSIVE METABOLIC PANEL
ALK PHOS: 60 U/L (ref 38–126)
ALT: 41 U/L (ref 14–54)
AST: 22 U/L (ref 15–41)
Albumin: 2.8 g/dL — ABNORMAL LOW (ref 3.5–5.0)
Anion gap: 9 (ref 5–15)
BUN: 33 mg/dL — AB (ref 6–20)
CALCIUM: 8.9 mg/dL (ref 8.9–10.3)
CHLORIDE: 103 mmol/L (ref 101–111)
CO2: 27 mmol/L (ref 22–32)
CREATININE: 1.01 mg/dL — AB (ref 0.44–1.00)
GFR calc Af Amer: >60 mL/min (ref >60)
GFR, EST NON AFRICAN AMERICAN: 58 mL/min — AB (ref >60)
Glucose, Bld: 118 mg/dL — ABNORMAL HIGH (ref 65–99)
Potassium: 4.5 mmol/L (ref 3.5–5.1)
Sodium: 139 mmol/L (ref 135–145)
Total Bilirubin: 0.4 mg/dL (ref 0.3–1.2)
Total Protein: 6.7 g/dL (ref 6.5–8.1)

## 2016-11-27 NOTE — Progress Notes (Signed)
Physical Therapy Session Note  Patient Details  Name: Andrea [name] MRN: [id number] Date of Birth: 04/27/1950  Today's Date: 11/27/2016 PT Individual Time:1620-1705   45 min   Short Term Goals: Week 1:  PT Short Term Goal 1 (Week 1): Pt will ambulate x 50 ft with min assist and LRAD PT Short Term Goal 2 (Week 1): Pt will perform bed<>chair transfers with supervision PT Short Term Goal 3 (Week 1): Pt will propel w/c x 150 ft with supervision  Skilled Therapeutic Interventions/Progress Updates:   Pt received supine in bed and agreeable to PT. Supine>sit transfer with supervision assist and cues   Gait training with SPC x 75 ft with min assist from PT. Pt noted to have antalgic gait pattern,but reports no pain in residual limb.  Dynamic gait training with RW. To weave through 6 cones and step over 2 obstacles(1") supervision assist provided by PT with min-mod cues for AD management over threshold.   Stair negotiation. X 4 steps (6") with min assist and min cues for step to gait pattern to reduce pressure on the L residual limb.   WC mobility with supervision assist from PT x 218f with min cues for straight trajectory and improved control on carpeted surface.   [facility] to [facility] and all needs met.        Therapy Documentation Precautions:  Precautions Precautions: Fall Restrictions Weight Bearing Restrictions: Yes LLE Weight Bearing: Non weight bearing(heel wt bearing ) Other Position/Activity Restrictions: Darco shoe for left foot with weightbearing. Pain: 0/10   See Function Navigator for Current Functional Status.   Therapy/Group: Individual Therapy  ALorie Phenix11/16/2018, 4:49 PM

## 2016-11-27 NOTE — Progress Notes (Signed)
Subjective/Complaints: Patient had an episode yesterday afternoon where she did not feel well and was lightheaded.  She was started on some IV fluids and states that she is feeling better this morning.  Denies any dizziness palpitations chest pain shortness of breath.  ROS: Negative for nausea vomiting abdominal pain insomnia  Objective: Vital Signs: Blood pressure (!) 140/44, pulse 73, temperature 98.6 F (37 C), temperature source Oral, resp. rate 18, height _0  (1.6 m), weight 55.2 kg (121 lb 9.6 oz), SpO2 97 %. No results found. Results for orders placed or performed during the hospital encounter of 11/21/16 (from the past 72 hour(s))  Glucose, capillary     Status: Abnormal   Collection Time: 11/24/16 11:39 AM  Result Value Ref Range   Glucose-Capillary 115 (H) 65 - 99 mg/dL  Glucose, capillary     Status: Abnormal   Collection Time: 11/24/16  5:05 PM  Result Value Ref Range   Glucose-Capillary 125 (H) 65 - 99 mg/dL  Glucose, capillary     Status: Abnormal   Collection Time: 11/24/16  8:37 PM  Result Value Ref Range   Glucose-Capillary 100 (H) 65 - 99 mg/dL  Glucose, capillary     Status: None   Collection Time: 11/24/16 10:15 PM  Result Value Ref Range   Glucose-Capillary 82 65 - 99 mg/dL  Glucose, capillary     Status: None   Collection Time: 11/25/16  3:25 AM  Result Value Ref Range   Glucose-Capillary 67 65 - 99 mg/dL  Glucose, capillary     Status: None   Collection Time: 11/25/16  4:05 AM  Result Value Ref Range   Glucose-Capillary 85 65 - 99 mg/dL  Glucose, capillary     Status: None   Collection Time: 11/25/16  6:20 AM  Result Value Ref Range   Glucose-Capillary 82 65 - 99 mg/dL  Glucose, capillary     Status: Abnormal   Collection Time: 11/25/16 12:25 PM  Result Value Ref Range   Glucose-Capillary 111 (H) 65 - 99 mg/dL  Glucose, capillary     Status: Abnormal   Collection Time: 11/25/16  4:43 PM  Result Value Ref Range   Glucose-Capillary 119 (H) 65 -  99 mg/dL  Glucose, capillary     Status: Abnormal   Collection Time: 11/25/16  8:57 PM  Result Value Ref Range   Glucose-Capillary 224 (H) 65 - 99 mg/dL  Glucose, capillary     Status: None   Collection Time: 11/26/16  6:38 AM  Result Value Ref Range   Glucose-Capillary 92 65 - 99 mg/dL  Glucose, capillary     Status: Abnormal   Collection Time: 11/26/16 12:03 PM  Result Value Ref Range   Glucose-Capillary 114 (H) 65 - 99 mg/dL  Comprehensive metabolic panel     Status: Abnormal   Collection Time: 11/26/16  4:01 PM  Result Value Ref Range   Sodium 140 135 - 145 mmol/L   Potassium 4.6 3.5 - 5.1 mmol/L   Chloride 106 101 - 111 mmol/L   CO2 24 22 - 32 mmol/L   Glucose, Bld 168 (H) 65 - 99 mg/dL   BUN 34 (H) 6 - 20 mg/dL   Creatinine, Ser 1.12 (H) 0.44 - 1.00 mg/dL   Calcium 8.7 (L) 8.9 - 10.3 mg/dL   Total Protein 6.6 6.5 - 8.1 g/dL   Albumin 2.8 (L) 3.5 - 5.0 g/dL   AST 29 15 - 41 U/L   ALT 51 14 - 54 U/L  Alkaline Phosphatase 62 38 - 126 U/L   Total Bilirubin 0.4 0.3 - 1.2 mg/dL   GFR calc non Af Amer 51 (L) >60 mL/min   GFR calc Af Amer 59 (L) >60 mL/min    Comment: (NOTE) The eGFR has been calculated using the CKD EPI equation. This calculation has not been validated in all clinical situations. eGFR's persistently <60 mL/min signify possible Chronic Kidney Disease.    Anion gap 10 5 - 15  CBC     Status: Abnormal   Collection Time: 11/26/16  4:01 PM  Result Value Ref Range   WBC 7.5 4.0 - 10.5 K/uL   RBC 2.95 (L) 3.87 - 5.11 MIL/uL   Hemoglobin 8.1 (L) 12.0 - 15.0 g/dL   HCT 27.1 (L) 36.0 - 46.0 %   MCV 91.9 78.0 - 100.0 fL   MCH 27.5 26.0 - 34.0 pg   MCHC 29.9 (L) 30.0 - 36.0 g/dL   RDW 15.0 11.5 - 15.5 %   Platelets 292 150 - 400 K/uL  Glucose, capillary     Status: Abnormal   Collection Time: 11/26/16  4:41 PM  Result Value Ref Range   Glucose-Capillary 134 (H) 65 - 99 mg/dL  Glucose, capillary     Status: None   Collection Time: 11/26/16  9:04 PM  Result  Value Ref Range   Glucose-Capillary 96 65 - 99 mg/dL  Comprehensive metabolic panel     Status: Abnormal   Collection Time: 11/27/16  5:25 AM  Result Value Ref Range   Sodium 139 135 - 145 mmol/L   Potassium 4.5 3.5 - 5.1 mmol/L   Chloride 103 101 - 111 mmol/L   CO2 27 22 - 32 mmol/L   Glucose, Bld 118 (H) 65 - 99 mg/dL   BUN 33 (H) 6 - 20 mg/dL   Creatinine, Ser 1.01 (H) 0.44 - 1.00 mg/dL   Calcium 8.9 8.9 - 10.3 mg/dL   Total Protein 6.7 6.5 - 8.1 g/dL   Albumin 2.8 (L) 3.5 - 5.0 g/dL   AST 22 15 - 41 U/L   ALT 41 14 - 54 U/L   Alkaline Phosphatase 60 38 - 126 U/L   Total Bilirubin 0.4 0.3 - 1.2 mg/dL   GFR calc non Af Amer 58 (L) >60 mL/min   GFR calc Af Amer >60 >60 mL/min    Comment: (NOTE) The eGFR has been calculated using the CKD EPI equation. This calculation has not been validated in all clinical situations. eGFR's persistently <60 mL/min signify possible Chronic Kidney Disease.    Anion gap 9 5 - 15  Glucose, capillary     Status: Abnormal   Collection Time: 11/27/16  6:24 AM  Result Value Ref Range   Glucose-Capillary 105 (H) 65 - 99 mg/dL     HEENT: normal Cardio: RRR without murmur. No JVD  Resp: CTA Bilaterally without wheezes or rales. Normal effort    GI: BS positive and Nontender, nondistended Extremity:  No Edema Skin:   Left TMA incision is clean with minimal serosanguineous drainage.  Wound well approximated Neuro: Alert/Oriented, Abnormal Sensory Reduced light touch sensation right foot and Abnormal Motor 5/5 bilateral deltoids, biceps, triceps, grip 4/5 in the right hip flexion, knee extensor, 3 minus, ankle dorsal flexor 3 minus to flexion and extension, 4 minus. Left hip flexion, knee extension--motor exam stable Musc/Skel: Left foot tenderness to palpation Gen. no acute distress   Assessment/Plan: 1. Functional deficits secondary to left TMA, nonweightbearing which require 3+ hours  per day of interdisciplinary therapy in a comprehensive  inpatient rehab setting. Physiatrist is providing close team supervision and 24 hour management of active medical problems listed below. Physiatrist and rehab team continue to assess barriers to discharge/monitor patient progress toward functional and medical goals. FIM: Function - Bathing Position: Wheelchair/chair at sink Body parts bathed by patient: Right arm, Left arm, Chest, Right upper leg, Left upper leg, Right lower leg, Left lower leg, Abdomen, Front perineal area, Buttocks Body parts bathed by helper: Back Assist Level: Touching or steadying assistance(Pt > 75%)  Function- Upper Body Dressing/Undressing What is the patient wearing?: Pull over shirt/dress Bra - Perfomed by patient: Thread/unthread right bra strap, Thread/unthread left bra strap, Hook/unhook bra (pull down sports bra) Pull over shirt/dress - Perfomed by patient: Thread/unthread right sleeve, Thread/unthread left sleeve, Put head through opening, Pull shirt over trunk Assist Level: Supervision or verbal cues, Set up Set up : To obtain clothing/put away Function - Lower Body Dressing/Undressing What is the patient wearing?: Pants, Shoes, Underwear Position: Wheelchair/chair at sink Underwear - Performed by patient: Thread/unthread right underwear leg, Thread/unthread left underwear leg, Pull underwear up/down Pants- Performed by patient: Thread/unthread right pants leg, Thread/unthread left pants leg, Fasten/unfasten pants, Pull pants up/down Non-skid slipper socks- Performed by patient: Don/doff right sock Shoes - Performed by patient: Don/doff left shoe, Don/doff right shoe Shoes - Performed by helper: Don/doff left shoe(DARCO) Assist for footwear: Supervision/touching assist Assist for lower body dressing: Touching or steadying assistance (Pt > 75%)  Function - Toileting Toileting steps completed by patient: Adjust clothing prior to toileting, Performs perineal hygiene, Adjust clothing after toileting Toileting  Assistive Devices: Grab bar or rail Assist level: Touching or steadying assistance (Pt.75%)  Function - Air cabin crew transfer assistive device: Grab bar, Walker Assist level to toilet: Touching or steadying assistance (Pt > 75%) Assist level from toilet: Touching or steadying assistance (Pt > 75%)  Function - Chair/bed transfer Chair/bed transfer method: Ambulatory Chair/bed transfer assist level: Touching or steadying assistance (Pt > 75%) Chair/bed transfer assistive device: Cane Chair/bed transfer details: Verbal cues for precautions/safety, Verbal cues for safe use of DME/AE  Function - Locomotion: Wheelchair Will patient use wheelchair at discharge?: Yes Type: Manual Max wheelchair distance: 150 ft Assist Level: Supervision or verbal cues Assist Level: Supervision or verbal cues Assist Level: Supervision or verbal cues Turns around,maneuvers to table,bed, and toilet,negotiates 3% grade,maneuvers on rugs and over doorsills: No Function - Locomotion: Ambulation Assistive device: Cane-straight, Walker-rolling Max distance: 60 Assist level: Touching or steadying assistance (Pt > 75%) Assist level: Touching or steadying assistance (Pt > 75%) Walk 50 feet with 2 turns activity did not occur: Safety/medical concerns Assist level: Touching or steadying assistance (Pt > 75%) Walk 150 feet activity did not occur: Safety/medical concerns Walk 10 feet on uneven surfaces activity did not occur: Safety/medical concerns  Function - Comprehension Comprehension: Auditory Comprehension assist level: Follows complex conversation/direction with no assist  Function - Expression Expression: Verbal Expression assist level: Expresses complex ideas: With no assist  Function - Social Interaction Social Interaction assist level: Interacts appropriately 90% of the time - Needs monitoring or encouragement for participation or interaction.  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 DVT Prophylaxis/Anticoagulation: Subcutaneous heparin. Monitor for any bleeding episodes 3. Pain Management:Cymbalta 60 mg daily, Neurontin 300 mg 3 times a day, hydrocodone as needed  -Pain is controlled at present  4. Mood:Provide emotional support 5. Neuropsych: This patientiscapable of making decisions on herown behalf. 6. Skin/Wound Care:Routine skin checks 7. Fluids/Electrolytes/Nutrition:  -Push p.o. fluids and continue IV fluids for now at a continuous rate  -Recheck labs in the morning  -Continue to hold Lasix,  Will hold HCTZ for now as well (already received today). 8.Acute blood loss anemia. Follow-up CBC,  hemoglobin on 11/21/2016 was 8.0, hemoglobin 8.5 on 11/23/2016.  We will continue to monitor 9.Diabetes mellitus peripheral neuropathy hemoglobin A1c 7.3. Lantus insulin 30 units daily at bedtime, Glucophage 1000 mg twice a day.Diabetic teaching Reduced Lantus to 25 units nightly  -At bedtime CBG was elevated last evening.  Consider adjusting Glucophage again  10.Hypertension.(HCTZ 25 mg daily, Lasix 10 mg daily), lisinopril 5 mg daily, Lopressor 12.5 mg twice a day Vitals:   11/26/16 2153 11/27/16 0536  BP: (!) 136/54 (!) 140/44  Pulse: 80 73  Resp:  18  Temp:  98.6 F (37 C)  SpO2:  97%  -Lasix held due to episode yesterday and ongoing elevation of her BUN and creatinine.   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 Continue aspirin and Plavix. No chest pain or shortness of breath 12.Hyperlipidemia. Lipitor 13.Tobacco abuse. Patient recently stopped smoking 7 months ago. Provide counseling  LOS (Days) 6 A FACE TO FACE EVALUATION WAS PERFORMED  Hayat Warbington T 11/27/2016, 9:01 AM

## 2016-11-27 NOTE — Progress Notes (Signed)
Occupational Therapy Session Note  Patient Details  Name: Elizebeth Brookingoni B Kearse MRN: [id number] Date of Birth: 07-31-1950  Today's Date: 11/27/2016 OT Individual Time: 1115-1200 OT Individual Time Calculation (min): 45 min    Short Term Goals: Week 1:  OT Short Term Goal 1 (Week 1): Pt will completed toilet transfer with min A  OT Short Term Goal 2 (Week 1): Pt will completed LB dressing with CGA  OT Short Term Goal 3 (Week 1): Pt will completed grooming tasks at sink while standing with minA  OT Short Term Goal 4 (Week 1): Pt will complete shower transfers with min A  Week 2:     Skilled Therapeutic Interventions/Progress Updates:   1:1 Make up session 45 min   Continued focus on functional mobility through narrow passageways and around obstacles with SPC while maintaining weight through heel with contact guard. Able to progress to going through obstacle course stepping over thresholds and then carrying a cup of water (without a free hand).    Propelled w/c ~150 feet to focus on strengthening UE. Performed UB strengthening with 1lb weighted bar with rest breaks as needed.  Doffed ace wrap to practice wrapping foot but pt with bandage saturated with fluids. RN notified.  Pt left with foot propped up.   Therapy Documentation Precautions:  Precautions Precautions: Fall Restrictions Weight Bearing Restrictions: Yes LLE Weight Bearing: Non weight bearing(heel wt bearing ) Other Position/Activity Restrictions: Darco shoe for left foot with weightbearing. Pain: Pain Assessment Pain Score: 0-No pain  See Function Navigator for Current Functional Status.   Therapy/Group: Individual Therapy  Roney MansSmith, Agape Hardiman [facility] 11/27/2016, 1:50 PM

## 2016-11-27 NOTE — Progress Notes (Signed)
Physical Therapy Session Note  Patient Details  Name: Andrea [name] MRN: [id number] Date of Birth: 03/30/1950  Today's Date: 11/27/2016 PT Individual Time: 0800-0900 PT Individual Time Calculation (min): 60 min   Short Term Goals: Week 1:  PT Short Term Goal 1 (Week 1): Pt will ambulate x 50 ft with min assist and LRAD PT Short Term Goal 2 (Week 1): Pt will perform bed<>chair transfers with supervision PT Short Term Goal 3 (Week 1): Pt will propel w/c x 150 ft with supervision  Skilled Therapeutic Interventions/Progress Updates: Pt received asleep in bed, easily awoken and agreeable to treatment. Denies pain but reports "feeling like I'm hungover". Supine>sit with S and bedrails. Pt ate breakfast on edge of bed modI with increased time. Pt directed care for retrieving clothing items. Assisted with fastening bra for time management as RN present to manage IV while dressing, otherwise performed upper body dressing with modI. Pt had been incontinent in bed overnight, reports she forgot to put on brief before bed. Pt impulsively ambulated to bathroom without darco shoe on despite max cueing from therapist, pt states "I really gotta go!". Educated pt on importance of wearing darco shoe with any standing to protect surgical site. S for toileting; setup with washcloths to perform peri hygiene. Gait with RW x75' with min guard/close S until fatigued. Gait in ADL apartment on carpeted surface with SPC to perform recliner transfer and locating/retrieving numbered targets for focus on dynamic gait with head turns and direction changes, reaching outside BOS. Min guard overall, including retrieving item off floor. One minor LOB with staggering steps forward to catch balance; no additional assist required to prevent fall. Returned to room totalA in w/c; remained seated in w/c at end of session, all needs in reach.     Therapy Documentation Precautions:  Precautions Precautions: Fall Restrictions Weight  Bearing Restrictions: Yes LLE Weight Bearing: Non weight bearing(heel wt bearing ) Other Position/Activity Restrictions: Darco shoe for left foot with weightbearing. Pain: Pain Assessment Pain Assessment: No/denies pain Pain Score: 0-No pain   See Function Navigator for Current Functional Status.   Therapy/Group: Individual Therapy  Vista Lawmanlizabeth J Tygielski 11/27/2016, 9:02 AM

## 2016-11-27 NOTE — Progress Notes (Signed)
Occupational Therapy Note  Patient Details  Name: Andrea [name] MRN: [id number] Date of Birth: Apr 12, 1950  Today's Date: 11/27/2016 OT Individual Time: 1345-1400 OT Individual Time Calculation (min): 15 min  and Today's Date: 11/27/2016 OT Missed Time: 30 Minutes Missed Time Reason: Patient fatigue  Pt denies pain Individual Therapy  Pt asleep in bed upon arrival and required min multi modal cues to arouse.  Pt stated she was exhausted but would "try" to participate.  Pt initially engaged in sit<>stand and standing activities.  Pt required increased assistance vs morning session.  Pt with difficulty keeping eyes opened.  Pt unable to continue and missed 30 mins skilled OT services.    Lavone NeriLanier, [name] [facility] 11/27/2016, 2:21 PM

## 2016-11-27 NOTE — Progress Notes (Signed)
Occupational Therapy Session Note  Patient Details  Name: Andrea [name] MRN: [id number] Date of Birth: [birth year]  Today's Date: 11/27/2016 OT Individual Time: 1000-1055 OT Individual Time Calculation (min): 55 min    Short Term Goals: Week 1:  OT Short Term Goal 1 (Week 1): Pt will completed toilet transfer with min A  OT Short Term Goal 2 (Week 1): Pt will completed LB dressing with CGA  OT Short Term Goal 3 (Week 1): Pt will completed grooming tasks at sink while standing with minA  OT Short Term Goal 4 (Week 1): Pt will complete shower transfers with min A   Skilled Therapeutic Interventions/Progress Updates:    Pt resting in w/c upon arrival.  Pt already dressed earlier with PT.  Pt transitioned to ADL apartment and engaged in functional amb [facility] for simple home mgmt tasks.  Pt practiced navigating in cluttered envirionment in ADL (simulate home envirionment), retrieving items from floor with reacher, performing BUE tasks while standing.  Pt completed all tasks safely with steady A while ambulating.  Pt propelled w/c back to room and remained in w/c with all needs within reach.   Therapy Documentation Precautions:  Precautions Precautions: Fall Restrictions Weight Bearing Restrictions: Yes LLE Weight Bearing: Non weight bearing(heel wt bearing ) Other Position/Activity Restrictions: Darco shoe for left foot with weightbearing. Pain:  Pt denies pain See Function Navigator for Current Functional Status.   Therapy/Group: Individual Therapy  Rich BraveLanier, Jaidy Cottam [facility] 11/27/2016, 10:56 AM

## 2016-11-28 ENCOUNTER — Inpatient Hospital Stay (HOSPITAL_COMMUNITY): Payer: BLUE CROSS/BLUE SHIELD | Admitting: Occupational Therapy

## 2016-11-28 DIAGNOSIS — E1142 Type 2 diabetes mellitus with diabetic polyneuropathy: Secondary | ICD-10-CM

## 2016-11-28 DIAGNOSIS — G8918 Other acute postprocedural pain: Secondary | ICD-10-CM

## 2016-11-28 DIAGNOSIS — N179 Acute kidney failure, unspecified: Secondary | ICD-10-CM

## 2016-11-28 LAB — BASIC METABOLIC PANEL
Anion gap: 10 (ref 5–15)
BUN: 35 mg/dL — AB (ref 6–20)
CALCIUM: 8.6 mg/dL — AB (ref 8.9–10.3)
CO2: 25 mmol/L (ref 22–32)
CREATININE: 1.01 mg/dL — AB (ref 0.44–1.00)
Chloride: 106 mmol/L (ref 101–111)
GFR, EST NON AFRICAN AMERICAN: 58 mL/min — AB (ref >60)
Glucose, Bld: 108 mg/dL — ABNORMAL HIGH (ref 65–99)
Potassium: 4 mmol/L (ref 3.5–5.1)
SODIUM: 141 mmol/L (ref 135–145)

## 2016-11-28 LAB — GLUCOSE, CAPILLARY
GLUCOSE-CAPILLARY: 99 mg/dL (ref 65–99)
GLUCOSE-CAPILLARY: 150 mg/dL — AB (ref 65–99)
Glucose-Capillary: 147 mg/dL — ABNORMAL HIGH (ref 65–99)
Glucose-Capillary: 98 mg/dL (ref 65–99)

## 2016-11-28 NOTE — Plan of Care (Signed)
  RH BOWEL ELIMINATION RH STG MANAGE BOWEL W/MEDICATION W/ASSISTANCE Description STG Manage Bowel with Medication with Assistance. 11/28/2016 1614 - Progressing by Melina ModenaBurchett, Louise Rawson, RN   RH BLADDER ELIMINATION RH STG MANAGE BLADDER WITH ASSISTANCE Description STG Manage Bladder With Assistance 11/28/2016 1614 - Progressing by Melina ModenaBurchett, Lawyer Washabaugh, RN   RH SKIN INTEGRITY RH STG SKIN FREE OF INFECTION/BREAKDOWN 11/28/2016 1614 - Progressing by Melina ModenaBurchett, Belky Mundo, RN   RH SKIN INTEGRITY RH STG ABLE TO PERFORM INCISION/WOUND CARE W/ASSISTANCE Description STG Able To Perform Incision/Wound Care With Assistance. 11/28/2016 1614 - Progressing by Melina ModenaBurchett, Brieana Shimmin, RN   RH PAIN MANAGEMENT RH STG PAIN MANAGED AT OR BELOW PT'S PAIN GOAL 11/28/2016 1614 - Progressing by Melina ModenaBurchett, Sharman Garrott, RN   RH KNOWLEDGE DEFICIT GENERAL RH STG INCREASE KNOWLEDGE OF SELF CARE AFTER HOSPITALIZATION 11/28/2016 1614 - Progressing by Melina ModenaBurchett, Consandra Laske, RN

## 2016-11-28 NOTE — Progress Notes (Signed)
Subjective/Complaints: Patient seen lying in bed this morning.  She states she slept well overnight.  She states she feels better today.  ROS: Denies CP, S OB, nausea, vomiting, diarrhea.    Objective: Vital Signs: Blood pressure (!) 108/32, pulse 68, temperature 98.2 F (36.8 C), temperature source Oral, resp. rate 17, height 5' 3"  (1.6 m), weight 55.2 kg (121 lb 9.6 oz), SpO2 98 %. No results found. Results for orders placed or performed during the hospital encounter of 11/21/16 (from the past 72 hour(s))  Glucose, capillary     Status: Abnormal   Collection Time: 11/25/16  4:43 PM  Result Value Ref Range   Glucose-Capillary 119 (H) 65 - 99 mg/dL  Glucose, capillary     Status: Abnormal   Collection Time: 11/25/16  8:57 PM  Result Value Ref Range   Glucose-Capillary 224 (H) 65 - 99 mg/dL  Glucose, capillary     Status: None   Collection Time: 11/26/16  6:38 AM  Result Value Ref Range   Glucose-Capillary 92 65 - 99 mg/dL  Glucose, capillary     Status: Abnormal   Collection Time: 11/26/16 12:03 PM  Result Value Ref Range   Glucose-Capillary 114 (H) 65 - 99 mg/dL  Comprehensive metabolic panel     Status: Abnormal   Collection Time: 11/26/16  4:01 PM  Result Value Ref Range   Sodium 140 135 - 145 mmol/L   Potassium 4.6 3.5 - 5.1 mmol/L   Chloride 106 101 - 111 mmol/L   CO2 24 22 - 32 mmol/L   Glucose, Bld 168 (H) 65 - 99 mg/dL   BUN 34 (H) 6 - 20 mg/dL   Creatinine, Ser 1.12 (H) 0.44 - 1.00 mg/dL   Calcium 8.7 (L) 8.9 - 10.3 mg/dL   Total Protein 6.6 6.5 - 8.1 g/dL   Albumin 2.8 (L) 3.5 - 5.0 g/dL   AST 29 15 - 41 U/L   ALT 51 14 - 54 U/L   Alkaline Phosphatase 62 38 - 126 U/L   Total Bilirubin 0.4 0.3 - 1.2 mg/dL   GFR calc non Af Amer 51 (L) >60 mL/min   GFR calc Af Amer 59 (L) >60 mL/min    Comment: (NOTE) The eGFR has been calculated using the CKD EPI equation. This calculation has not been validated in all clinical situations. eGFR's persistently <60 mL/min  signify possible Chronic Kidney Disease.    Anion gap 10 5 - 15  CBC     Status: Abnormal   Collection Time: 11/26/16  4:01 PM  Result Value Ref Range   WBC 7.5 4.0 - 10.5 K/uL   RBC 2.95 (L) 3.87 - 5.11 MIL/uL   Hemoglobin 8.1 (L) 12.0 - 15.0 g/dL   HCT 27.1 (L) 36.0 - 46.0 %   MCV 91.9 78.0 - 100.0 fL   MCH 27.5 26.0 - 34.0 pg   MCHC 29.9 (L) 30.0 - 36.0 g/dL   RDW 15.0 11.5 - 15.5 %   Platelets 292 150 - 400 K/uL  Glucose, capillary     Status: Abnormal   Collection Time: 11/26/16  4:41 PM  Result Value Ref Range   Glucose-Capillary 134 (H) 65 - 99 mg/dL  Glucose, capillary     Status: None   Collection Time: 11/26/16  9:04 PM  Result Value Ref Range   Glucose-Capillary 96 65 - 99 mg/dL  Comprehensive metabolic panel     Status: Abnormal   Collection Time: 11/27/16  5:25 AM  Result Value  Ref Range   Sodium 139 135 - 145 mmol/L   Potassium 4.5 3.5 - 5.1 mmol/L   Chloride 103 101 - 111 mmol/L   CO2 27 22 - 32 mmol/L   Glucose, Bld 118 (H) 65 - 99 mg/dL   BUN 33 (H) 6 - 20 mg/dL   Creatinine, Ser 1.01 (H) 0.44 - 1.00 mg/dL   Calcium 8.9 8.9 - 10.3 mg/dL   Total Protein 6.7 6.5 - 8.1 g/dL   Albumin 2.8 (L) 3.5 - 5.0 g/dL   AST 22 15 - 41 U/L   ALT 41 14 - 54 U/L   Alkaline Phosphatase 60 38 - 126 U/L   Total Bilirubin 0.4 0.3 - 1.2 mg/dL   GFR calc non Af Amer 58 (L) >60 mL/min   GFR calc Af Amer >60 >60 mL/min    Comment: (NOTE) The eGFR has been calculated using the CKD EPI equation. This calculation has not been validated in all clinical situations. eGFR's persistently <60 mL/min signify possible Chronic Kidney Disease.    Anion gap 9 5 - 15  Glucose, capillary     Status: Abnormal   Collection Time: 11/27/16  6:24 AM  Result Value Ref Range   Glucose-Capillary 105 (H) 65 - 99 mg/dL  Glucose, capillary     Status: Abnormal   Collection Time: 11/27/16 11:55 AM  Result Value Ref Range   Glucose-Capillary 163 (H) 65 - 99 mg/dL  Glucose, capillary     Status:  Abnormal   Collection Time: 11/27/16 12:32 PM  Result Value Ref Range   Glucose-Capillary 163 (H) 65 - 99 mg/dL  Glucose, capillary     Status: Abnormal   Collection Time: 11/27/16  5:05 PM  Result Value Ref Range   Glucose-Capillary 118 (H) 65 - 99 mg/dL  Glucose, capillary     Status: Abnormal   Collection Time: 11/27/16  9:41 PM  Result Value Ref Range   Glucose-Capillary 176 (H) 65 - 99 mg/dL   Comment 1 Notify RN   Basic metabolic panel     Status: Abnormal   Collection Time: 11/28/16  6:08 AM  Result Value Ref Range   Sodium 141 135 - 145 mmol/L   Potassium 4.0 3.5 - 5.1 mmol/L   Chloride 106 101 - 111 mmol/L   CO2 25 22 - 32 mmol/L   Glucose, Bld 108 (H) 65 - 99 mg/dL   BUN 35 (H) 6 - 20 mg/dL   Creatinine, Ser 1.01 (H) 0.44 - 1.00 mg/dL   Calcium 8.6 (L) 8.9 - 10.3 mg/dL   GFR calc non Af Amer 58 (L) >60 mL/min   GFR calc Af Amer >60 >60 mL/min    Comment: (NOTE) The eGFR has been calculated using the CKD EPI equation. This calculation has not been validated in all clinical situations. eGFR's persistently <60 mL/min signify possible Chronic Kidney Disease.    Anion gap 10 5 - 15  Glucose, capillary     Status: None   Collection Time: 11/28/16  6:46 AM  Result Value Ref Range   Glucose-Capillary 99 65 - 99 mg/dL   Comment 1 Notify RN   Glucose, capillary     Status: Abnormal   Collection Time: 11/28/16 12:19 PM  Result Value Ref Range   Glucose-Capillary 147 (H) 65 - 99 mg/dL     HEENT: Normocephalic, atraumatic. Cardio: RRR. No JVD  Resp: CTA Bilaterally. Normal effort    GI: BS positive and nondistended Skin:   Left TMA  incision is clean with minimal serosanguineous drainage.  Wound well approximated Neuro: Alert/Oriented Motor 5/5 bilateral deltoids, biceps, triceps, grip  4/5 in the right hip flexion, knee extensor, 3-/5 ankle dorsi/plantar flexors  4-/5 left hip flexion, knee extension Musc/Skel: Left foot tenderness to palpation Gen. no acute  distress.  Vitals signs reviewed   Assessment/Plan: 1. Functional deficits secondary to left TMA, nonweightbearing which require 3+ hours per day of interdisciplinary therapy in a comprehensive inpatient rehab setting. Physiatrist is providing close team supervision and 24 hour management of active medical problems listed below. Physiatrist and rehab team continue to assess barriers to discharge/monitor patient progress toward functional and medical goals. FIM: Function - Bathing Position: Wheelchair/chair at sink Body parts bathed by patient: Right arm, Left arm, Chest, Right upper leg, Left upper leg, Right lower leg, Left lower leg, Abdomen, Front perineal area, Buttocks Body parts bathed by helper: Back Assist Level: Touching or steadying assistance(Pt > 75%)  Function- Upper Body Dressing/Undressing What is the patient wearing?: Pull over shirt/dress, Bra Bra - Perfomed by patient: Thread/unthread left bra strap, Thread/unthread right bra strap Bra - Perfomed by helper: Hook/unhook bra (pull down sports bra) Pull over shirt/dress - Perfomed by patient: Thread/unthread right sleeve, Thread/unthread left sleeve, Put head through opening, Pull shirt over trunk Assist Level: Touching or steadying assistance(Pt > 75%) Set up : To obtain clothing/put away Function - Lower Body Dressing/Undressing What is the patient wearing?: Pants, Shoes, Underwear Position: Wheelchair/chair at sink Underwear - Performed by patient: Thread/unthread right underwear leg, Thread/unthread left underwear leg, Pull underwear up/down Pants- Performed by patient: Thread/unthread right pants leg, Thread/unthread left pants leg, Fasten/unfasten pants, Pull pants up/down Non-skid slipper socks- Performed by patient: Don/doff right sock Shoes - Performed by patient: Don/doff right shoe Shoes - Performed by helper: Don/doff left shoe(darco) Assist for footwear: Supervision/touching assist Assist for lower body  dressing: Touching or steadying assistance (Pt > 75%)  Function - Toileting Toileting steps completed by patient: Adjust clothing prior to toileting, Performs perineal hygiene, Adjust clothing after toileting Toileting Assistive Devices: Grab bar or rail Assist level: Supervision or verbal cues  Function - Air cabin crew transfer assistive device: Landscape architect, Pension scheme manager level to toilet: Supervision or verbal cues Assist level from toilet: Supervision or verbal cues  Function - Chair/bed transfer Chair/bed transfer method: Stand pivot Chair/bed transfer assist level: Supervision or verbal cues Chair/bed transfer assistive device: Armrests Chair/bed transfer details: Verbal cues for precautions/safety, Verbal cues for safe use of DME/AE  Function - Locomotion: Wheelchair Will patient use wheelchair at discharge?: Yes Type: Manual Max wheelchair distance: 279f  Assist Level: Supervision or verbal cues Assist Level: Supervision or verbal cues Assist Level: Supervision or verbal cues Turns around,maneuvers to table,bed, and toilet,negotiates 3% grade,maneuvers on rugs and over doorsills: No Function - Locomotion: Ambulation Assistive device: Cane-straight Max distance: 751fAssist level: Touching or steadying assistance (Pt > 75%) Assist level: Touching or steadying assistance (Pt > 75%) Walk 50 feet with 2 turns activity did not occur: Safety/medical concerns Assist level: Touching or steadying assistance (Pt > 75%) Walk 150 feet activity did not occur: Safety/medical concerns Walk 10 feet on uneven surfaces activity did not occur: Safety/medical concerns  Function - Comprehension Comprehension: Auditory Comprehension assist level: Follows complex conversation/direction with no assist  Function - Expression Expression: Verbal Expression assist level: Expresses complex ideas: With no assist  Function - Social Interaction Social Interaction assist level: Interacts  appropriately 90% of the time - Needs monitoring or  encouragement for participation or interaction.  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 DVT Prophylaxis/Anticoagulation: Subcutaneous heparin. Monitor for any bleeding episodes 3. Pain Management:Cymbalta 60 mg daily, Neurontin 300 mg 3 times a day, hydrocodone as needed  -Pain controlled at present  4. Mood:Provide emotional support 5. Neuropsych: This patientiscapable of making decisions on herown behalf. 6. Skin/Wound Care:Routine skin checks 7. Fluids/Electrolytes/Nutrition:  -Push p.o. fluids and continue IV fluids for now at a continuous rate  -Continue to hold Lasix,  Will hold HCTZ. 8.Acute blood loss anemia.      hemoglobin 8.1 on 11/26/2016.     Continue to monitor 9.Diabetes mellitus peripheral neuropathy hemoglobin A1c 7.3. Lantus insulin 30 units daily at bedtime, Glucophage 1000 mg twice a day.Diabetic teaching Reduced Lantus to 25 units nightly  Relatively controlled 11/17 10.Hypertension.(HCTZ 25 mg daily, Lasix 10 mg daily), lisinopril 5 mg daily, Lopressor 12.5 mg twice a day Vitals:   11/28/16 0837 11/28/16 1454  BP:  (!) 108/32  Pulse:  68  Resp:  17  Temp:  98.2 F (36.8 C)  SpO2: 98% 98%  -Lasix held due to elevation of her BUN and creatinine.  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 Continue aspirin and Plavix. No chest pain or shortness of  breath 12.Hyperlipidemia. Lipitor 13.Tobacco abuse. Patient recently stopped smoking 7 months ago. Provide counseling 14. AKI   Cr 1.01 on 11/17   Stable, cont to monitor  LOS (Days) 7 A FACE TO FACE EVALUATION WAS PERFORMED  Sheranda Seabrooks Lorie Phenix 11/28/2016, 3:23 PM

## 2016-11-28 NOTE — Progress Notes (Signed)
Occupational Therapy Session Note  Patient Details  Name: Andrea [name] MRN: [id number] Date of Birth: Aug 23, 1950  Today's Date: 11/28/2016 OT Individual Time: 1300-1400 OT Individual Time Calculation (min): 60 min   Short Term Goals: Week 1:  OT Short Term Goal 1 (Week 1): Pt will completed toilet transfer with min A  OT Short Term Goal 2 (Week 1): Pt will completed LB dressing with CGA  OT Short Term Goal 3 (Week 1): Pt will completed grooming tasks at sink while standing with minA  OT Short Term Goal 4 (Week 1): Pt will complete shower transfers with min A   Skilled Therapeutic Interventions/Progress Updates:    OT treatment session focused on dc planning, activity tolerance, UB strengthening, and functional transfers. Pt finishing lunch upon OT arrival. Discussed dc plan, home modifications, and energy conservation techniques within iADL and bADL tasks. Pt donned darco shoe on L foot with set-up A.  Pt ambulated to bathroom with SPC and min guard/supervision assist. Pt voided bladder and completed 3/3 toileting steps without assistance..Pt stood to wash hands, then needed seated rest break. Pt ambulated to day room with RW and close supervision. Pt completed 15 mins on [employer] arm bike on level 2 with two-3 minute rest breaks. Pt ambulated back to room in similar fashion as above and left semi-reclined in bed with needs met.   Therapy Documentation Precautions:  Precautions Precautions: Fall Restrictions Weight Bearing Restrictions: Yes LLE Weight Bearing: Non weight bearing Other Position/Activity Restrictions: Darco shoe for left foot with weightbearing. Pain: Pain Assessment Pain Score: 0-No pain  See Function Navigator for Current Functional Status.  Therapy/Group: Individual Therapy  Valma Cava 11/28/2016, 1:15 PM

## 2016-11-29 ENCOUNTER — Inpatient Hospital Stay (HOSPITAL_COMMUNITY): Payer: BLUE CROSS/BLUE SHIELD | Admitting: Physical Therapy

## 2016-11-29 LAB — GLUCOSE, CAPILLARY
GLUCOSE-CAPILLARY: 91 mg/dL (ref 65–99)
GLUCOSE-CAPILLARY: 170 mg/dL — AB (ref 65–99)
Glucose-Capillary: 93 mg/dL (ref 65–99)
Glucose-Capillary: 110 mg/dL — ABNORMAL HIGH (ref 65–99)

## 2016-11-29 NOTE — Progress Notes (Signed)
Physical Therapy Session Note  Patient Details  Name: Andrea [name] MRN: [id number] Date of Birth: 09-16-1950  Today's Date: 11/29/2016 PT Individual Time: 1510-1540 PT Individual Time Calculation (min): 30 min - Make up session  Short Term Goals: Week 1:  PT Short Term Goal 1 (Week 1): Pt will ambulate x 50 ft with min assist and LRAD PT Short Term Goal 2 (Week 1): Pt will perform bed<>chair transfers with supervision PT Short Term Goal 3 (Week 1): Pt will propel w/c x 150 ft with supervision  Skilled Therapeutic Interventions/Progress Updates:    Pt seen for 30 min make up session. Pt supine in bed upon PT arrival, agreeable to PT tx and denies pain. Pt transferred supine to sitting EOB with supervision. Session focused on ambulation using SPC. Pt ambulated x 70 ft, x 50 ft and x 70 ft using SPC and min assist for balance. Pt continuously trying to reach for walls/furniture for UE support and kept switching which hand she would hold SPC, verbal cues to correct this. Pt performed x 10 sit<>stands for LE strengthening and balance without UE support. Pt ambulated from bed<>bathroom using SPC and min assist, performed toileting with supervision. Pt left supine in bed at end of session with needs in reach.   Therapy Documentation Precautions:  Precautions Precautions: Fall Restrictions Weight Bearing Restrictions: Yes LLE Weight Bearing: Non weight bearing Other Position/Activity Restrictions: Darco shoe for left foot with weightbearing.   See Function Navigator for Current Functional Status.   Therapy/Group: Individual Therapy  Cresenciano GenreEmily van Schagen, PT, DPT 11/29/2016, 3:41 PM

## 2016-11-29 NOTE — Progress Notes (Signed)
Subjective/Complaints: Seen lying in bed this morning. She states she slept well overnight, but is still sleepy this morning. Yesterday called by nursing regarding concerns and blood pressure.  ROS: Denies CP, S OB, nausea, vomiting, diarrhea.    Objective: Vital Signs: Blood pressure (!) 153/46, pulse 75, temperature 97.6 F (36.4 C), temperature source Oral, resp. rate 18, height 5' 3"  (1.6 m), weight 55.2 kg (121 lb 9.6 oz), SpO2 99 %. No results found. Results for orders placed or performed during the hospital encounter of 11/21/16 (from the past 72 hour(s))  Glucose, capillary     Status: Abnormal   Collection Time: 11/26/16 12:03 PM  Result Value Ref Range   Glucose-Capillary 114 (H) 65 - 99 mg/dL  Comprehensive metabolic panel     Status: Abnormal   Collection Time: 11/26/16  4:01 PM  Result Value Ref Range   Sodium 140 135 - 145 mmol/L   Potassium 4.6 3.5 - 5.1 mmol/L   Chloride 106 101 - 111 mmol/L   CO2 24 22 - 32 mmol/L   Glucose, Bld 168 (H) 65 - 99 mg/dL   BUN 34 (H) 6 - 20 mg/dL   Creatinine, Ser 1.12 (H) 0.44 - 1.00 mg/dL   Calcium 8.7 (L) 8.9 - 10.3 mg/dL   Total Protein 6.6 6.5 - 8.1 g/dL   Albumin 2.8 (L) 3.5 - 5.0 g/dL   AST 29 15 - 41 U/L   ALT 51 14 - 54 U/L   Alkaline Phosphatase 62 38 - 126 U/L   Total Bilirubin 0.4 0.3 - 1.2 mg/dL   GFR calc non Af Amer 51 (L) >60 mL/min   GFR calc Af Amer 59 (L) >60 mL/min    Comment: (NOTE) The eGFR has been calculated using the CKD EPI equation. This calculation has not been validated in all clinical situations. eGFR's persistently <60 mL/min signify possible Chronic Kidney Disease.    Anion gap 10 5 - 15  CBC     Status: Abnormal   Collection Time: 11/26/16  4:01 PM  Result Value Ref Range   WBC 7.5 4.0 - 10.5 K/uL   RBC 2.95 (L) 3.87 - 5.11 MIL/uL   Hemoglobin 8.1 (L) 12.0 - 15.0 g/dL   HCT 27.1 (L) 36.0 - 46.0 %   MCV 91.9 78.0 - 100.0 fL   MCH 27.5 26.0 - 34.0 pg   MCHC 29.9 (L) 30.0 - 36.0 g/dL    RDW 15.0 11.5 - 15.5 %   Platelets 292 150 - 400 K/uL  Glucose, capillary     Status: Abnormal   Collection Time: 11/26/16  4:41 PM  Result Value Ref Range   Glucose-Capillary 134 (H) 65 - 99 mg/dL  Glucose, capillary     Status: None   Collection Time: 11/26/16  9:04 PM  Result Value Ref Range   Glucose-Capillary 96 65 - 99 mg/dL  Comprehensive metabolic panel     Status: Abnormal   Collection Time: 11/27/16  5:25 AM  Result Value Ref Range   Sodium 139 135 - 145 mmol/L   Potassium 4.5 3.5 - 5.1 mmol/L   Chloride 103 101 - 111 mmol/L   CO2 27 22 - 32 mmol/L   Glucose, Bld 118 (H) 65 - 99 mg/dL   BUN 33 (H) 6 - 20 mg/dL   Creatinine, Ser 1.01 (H) 0.44 - 1.00 mg/dL   Calcium 8.9 8.9 - 10.3 mg/dL   Total Protein 6.7 6.5 - 8.1 g/dL   Albumin 2.8 (L) 3.5 -  5.0 g/dL   AST 22 15 - 41 U/L   ALT 41 14 - 54 U/L   Alkaline Phosphatase 60 38 - 126 U/L   Total Bilirubin 0.4 0.3 - 1.2 mg/dL   GFR calc non Af Amer 58 (L) >60 mL/min   GFR calc Af Amer >60 >60 mL/min    Comment: (NOTE) The eGFR has been calculated using the CKD EPI equation. This calculation has not been validated in all clinical situations. eGFR's persistently <60 mL/min signify possible Chronic Kidney Disease.    Anion gap 9 5 - 15  Glucose, capillary     Status: Abnormal   Collection Time: 11/27/16  6:24 AM  Result Value Ref Range   Glucose-Capillary 105 (H) 65 - 99 mg/dL  Glucose, capillary     Status: Abnormal   Collection Time: 11/27/16 11:55 AM  Result Value Ref Range   Glucose-Capillary 163 (H) 65 - 99 mg/dL  Glucose, capillary     Status: Abnormal   Collection Time: 11/27/16 12:32 PM  Result Value Ref Range   Glucose-Capillary 163 (H) 65 - 99 mg/dL  Glucose, capillary     Status: Abnormal   Collection Time: 11/27/16  5:05 PM  Result Value Ref Range   Glucose-Capillary 118 (H) 65 - 99 mg/dL  Glucose, capillary     Status: Abnormal   Collection Time: 11/27/16  9:41 PM  Result Value Ref Range    Glucose-Capillary 176 (H) 65 - 99 mg/dL   Comment 1 Notify RN   Basic metabolic panel     Status: Abnormal   Collection Time: 11/28/16  6:08 AM  Result Value Ref Range   Sodium 141 135 - 145 mmol/L   Potassium 4.0 3.5 - 5.1 mmol/L   Chloride 106 101 - 111 mmol/L   CO2 25 22 - 32 mmol/L   Glucose, Bld 108 (H) 65 - 99 mg/dL   BUN 35 (H) 6 - 20 mg/dL   Creatinine, Ser 1.01 (H) 0.44 - 1.00 mg/dL   Calcium 8.6 (L) 8.9 - 10.3 mg/dL   GFR calc non Af Amer 58 (L) >60 mL/min   GFR calc Af Amer >60 >60 mL/min    Comment: (NOTE) The eGFR has been calculated using the CKD EPI equation. This calculation has not been validated in all clinical situations. eGFR's persistently <60 mL/min signify possible Chronic Kidney Disease.    Anion gap 10 5 - 15  Glucose, capillary     Status: None   Collection Time: 11/28/16  6:46 AM  Result Value Ref Range   Glucose-Capillary 99 65 - 99 mg/dL   Comment 1 Notify RN   Glucose, capillary     Status: Abnormal   Collection Time: 11/28/16 12:19 PM  Result Value Ref Range   Glucose-Capillary 147 (H) 65 - 99 mg/dL  Glucose, capillary     Status: None   Collection Time: 11/28/16  5:05 PM  Result Value Ref Range   Glucose-Capillary 98 65 - 99 mg/dL  Glucose, capillary     Status: Abnormal   Collection Time: 11/28/16  9:28 PM  Result Value Ref Range   Glucose-Capillary 150 (H) 65 - 99 mg/dL   Comment 1 Notify RN   Glucose, capillary     Status: None   Collection Time: 11/29/16  6:30 AM  Result Value Ref Range   Glucose-Capillary 91 65 - 99 mg/dL   Comment 1 Notify RN      HEENT: Normocephalic, atraumatic. Cardio: RRR. No JVD  Resp: CTA Bilaterally. Normal effort    GI: BS positive and nondistended Skin:   Left TMA incision. Neuro: Alert/Oriented Motor 5/5 bilateral deltoids, biceps, triceps, grip  4/5 in the right hip flexion, knee extensor, 3-/5 ankle dorsi/plantar flexors  4-/5 left hip flexion, knee extension (stable) Musc/Skel: Left foot  tenderness to palpation Gen. no acute distress.  Vitals signs reviewed   Assessment/Plan: 1. Functional deficits secondary to left TMA, nonweightbearing which require 3+ hours per day of interdisciplinary therapy in a comprehensive inpatient rehab setting. Physiatrist is providing close team supervision and 24 hour management of active medical problems listed below. Physiatrist and rehab team continue to assess barriers to discharge/monitor patient progress toward functional and medical goals. FIM: Function - Bathing Position: Wheelchair/chair at sink Body parts bathed by patient: Right arm, Left arm, Chest, Right upper leg, Left upper leg, Right lower leg, Left lower leg, Abdomen, Front perineal area, Buttocks Body parts bathed by helper: Back Assist Level: Touching or steadying assistance(Pt > 75%)  Function- Upper Body Dressing/Undressing What is the patient wearing?: Pull over shirt/dress, Bra Bra - Perfomed by patient: Thread/unthread left bra strap, Thread/unthread right bra strap Bra - Perfomed by helper: Hook/unhook bra (pull down sports bra) Pull over shirt/dress - Perfomed by patient: Thread/unthread right sleeve, Thread/unthread left sleeve, Put head through opening, Pull shirt over trunk Assist Level: Touching or steadying assistance(Pt > 75%) Set up : To obtain clothing/put away Function - Lower Body Dressing/Undressing What is the patient wearing?: Pants, Shoes, Underwear Position: Wheelchair/chair at sink Underwear - Performed by patient: Thread/unthread right underwear leg, Thread/unthread left underwear leg, Pull underwear up/down Pants- Performed by patient: Thread/unthread right pants leg, Thread/unthread left pants leg, Fasten/unfasten pants, Pull pants up/down Non-skid slipper socks- Performed by patient: Don/doff right sock Shoes - Performed by patient: Don/doff right shoe Shoes - Performed by helper: Don/doff left shoe(darco) Assist for footwear:  Supervision/touching assist Assist for lower body dressing: Touching or steadying assistance (Pt > 75%)  Function - Toileting Toileting steps completed by patient: Adjust clothing prior to toileting, Performs perineal hygiene, Adjust clothing after toileting Toileting Assistive Devices: Grab bar or rail Assist level: Supervision or verbal cues  Function - Air cabin crew transfer assistive device: Landscape architect, Pension scheme manager level to toilet: Supervision or verbal cues Assist level from toilet: Supervision or verbal cues  Function - Chair/bed transfer Chair/bed transfer method: Stand pivot Chair/bed transfer assist level: Supervision or verbal cues Chair/bed transfer assistive device: Armrests Chair/bed transfer details: Verbal cues for precautions/safety, Verbal cues for safe use of DME/AE  Function - Locomotion: Wheelchair Will patient use wheelchair at discharge?: Yes Type: Manual Max wheelchair distance: 251f  Assist Level: Supervision or verbal cues Assist Level: Supervision or verbal cues Assist Level: Supervision or verbal cues Turns around,maneuvers to table,bed, and toilet,negotiates 3% grade,maneuvers on rugs and over doorsills: No Function - Locomotion: Ambulation Assistive device: Cane-straight Max distance: 758fAssist level: Touching or steadying assistance (Pt > 75%) Assist level: Touching or steadying assistance (Pt > 75%) Walk 50 feet with 2 turns activity did not occur: Safety/medical concerns Assist level: Touching or steadying assistance (Pt > 75%) Walk 150 feet activity did not occur: Safety/medical concerns Walk 10 feet on uneven surfaces activity did not occur: Safety/medical concerns  Function - Comprehension Comprehension: Auditory Comprehension assist level: Follows complex conversation/direction with extra time/assistive device  Function - Expression Expression: Verbal Expression assist level: Expresses complex ideas: With no  assist  Function - Social Interaction Social Interaction assist level:  Interacts appropriately 90% of the time - Needs monitoring or encouragement for participation or interaction.  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 DVT Prophylaxis/Anticoagulation: Subcutaneous heparin. Monitor for any bleeding episodes 3. Pain Management:Cymbalta 60 mg daily, Neurontin 300 mg 3 times a day, hydrocodone as needed  -Pain controlled at present  4. Mood:Provide emotional support 5. Neuropsych: This patientiscapable of making decisions on herown behalf. 6. Skin/Wound Care:Routine skin checks 7. Fluids/Electrolytes/Nutrition:  -Push p.o. fluids and continue IV fluids for now at a continuous rate  -Continue to hold Lasix,  Will hold HCTZ. 8.Acute blood loss anemia.      hemoglobin 8.1 on 11/26/2016.     Continue to monitor 9.Diabetes mellitus peripheral neuropathy hemoglobin A1c 7.3. Lantus insulin 30 units daily at bedtime, Glucophage 1000 mg twice a day.Diabetic teaching Reduced Lantus to 25 units nightly  Relatively controlled 11/18 10.Hypertension.(HCTZ 25 mg daily, Lasix 10 mg daily), lisinopril 5 mg daily, Lopressor 12.5 mg twice a day Vitals:   11/29/16 0500 11/29/16 0733  BP: (!) 153/46   Pulse: 75   Resp: 18   Temp: 97.6 F (36.4 C)   SpO2: 97% 99%    -Lasix held due to elevation of her BUN and creatinine.  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 Continue aspirin and Plavix. No chest pain or shortness of breath 12.Hyperlipidemia. Lipitor 13.Tobacco abuse. Patient recently stopped smoking 7 months ago. Provide counseling 14. AKI   Cr 1.01 on 11/17   Stable, cont to monitor  LOS (Days) 8 A FACE TO FACE EVALUATION WAS PERFORMED  Cyntia Staley Lorie Phenix 11/29/2016, 8:45 AM

## 2016-11-29 NOTE — Plan of Care (Signed)
  Progressing Consults RH GENERAL PATIENT EDUCATION Description See Patient Education module for education specifics. 11/29/2016 1549 - Progressing by Melina ModenaBurchett, Jannice Beitzel, RN RH BOWEL ELIMINATION RH STG MANAGE BOWEL WITH ASSISTANCE Description STG Manage Bowel with Assistance. 11/29/2016 1549 - Progressing by Melina ModenaBurchett, Humphrey Guerreiro, RN RH STG MANAGE BOWEL W/MEDICATION W/ASSISTANCE Description STG Manage Bowel with Medication with Assistance. 11/29/2016 1549 - Progressing by Melina ModenaBurchett, Briann Sarchet, RN RH BLADDER ELIMINATION RH STG MANAGE BLADDER WITH ASSISTANCE Description STG Manage Bladder With Assistance 11/29/2016 1549 - Progressing by Melina ModenaBurchett, Emmerson Taddei, RN RH SKIN INTEGRITY RH STG SKIN FREE OF INFECTION/BREAKDOWN 11/29/2016 1549 - Progressing by Melina ModenaBurchett, Luana Tatro, RN RH PAIN MANAGEMENT RH STG PAIN MANAGED AT OR BELOW PT'S PAIN GOAL 11/29/2016 1549 - Progressing by Melina ModenaBurchett, Shallon Yaklin, RN RH KNOWLEDGE DEFICIT GENERAL RH STG INCREASE KNOWLEDGE OF SELF CARE AFTER HOSPITALIZATION 11/29/2016 1549 - Progressing by Melina ModenaBurchett, Tyona Nilsen, RN

## 2016-11-29 NOTE — Progress Notes (Signed)
Physical Therapy Session Note  Patient Details  Name: Elizebeth Brookingoni B Breighner MRN: [id number] Date of Birth: 08-28-1950  Today's Date: 11/29/2016 PT Individual Time: 0800-0845 PT Individual Time Calculation (min): 45 min   Short Term Goals: Week 1:  PT Short Term Goal 1 (Week 1): Pt will ambulate x 50 ft with min assist and LRAD PT Short Term Goal 2 (Week 1): Pt will perform bed<>chair transfers with supervision PT Short Term Goal 3 (Week 1): Pt will propel w/c x 150 ft with supervision  Skilled Therapeutic Interventions/Progress Updates: Pt received seated in w/c, denies pain and agreeable to treatment. Pt propels w/c around room to retrieve and don shoes modI Gait x100' with RW and close S, min cues for maintaining RW closer to body. Ascent/descent 12 6-inch steps with B handrails and min guard faded to close S to simulate home environment; pt performs step-to pattern with variable lead LE for bilat strengthening. Performed obstacle course with SPC including weighted bars to step over, navigating around cones, onto foam airex pad and 3" step all performed with min guard. Returned to room BUE w/c propulsion for UE strengthening and endurance with pt reporting LEs very fatigued. Remained seated in w/c, all needs in reach.     Therapy Documentation Precautions:  Precautions Precautions: Fall Restrictions Weight Bearing Restrictions: Yes LLE Weight Bearing: Non weight bearing Other Position/Activity Restrictions: Darco shoe for left foot with weightbearing. Pain: Pain Assessment Pain Assessment: No/denies pain Pain Score: 0-No pain   See Function Navigator for Current Functional Status.   Therapy/Group: Individual Therapy  Vista Lawmanlizabeth J Tygielski 11/29/2016, 8:47 AM

## 2016-11-30 ENCOUNTER — Inpatient Hospital Stay (HOSPITAL_COMMUNITY): Payer: BLUE CROSS/BLUE SHIELD | Admitting: Occupational Therapy

## 2016-11-30 ENCOUNTER — Inpatient Hospital Stay (HOSPITAL_COMMUNITY): Payer: BLUE CROSS/BLUE SHIELD

## 2016-11-30 ENCOUNTER — Inpatient Hospital Stay (HOSPITAL_COMMUNITY): Payer: BLUE CROSS/BLUE SHIELD | Admitting: Physical Therapy

## 2016-11-30 ENCOUNTER — Ambulatory Visit: Payer: BLUE CROSS/BLUE SHIELD | Admitting: Family Medicine

## 2016-11-30 LAB — GLUCOSE, CAPILLARY
GLUCOSE-CAPILLARY: 102 mg/dL — AB (ref 65–99)
GLUCOSE-CAPILLARY: 122 mg/dL — AB (ref 65–99)
Glucose-Capillary: 86 mg/dL (ref 65–99)
Glucose-Capillary: 103 mg/dL — ABNORMAL HIGH (ref 65–99)

## 2016-11-30 MED ORDER — GLUCERNA SHAKE PO LIQD
237.0000 mL | Freq: Three times a day (TID) | ORAL | Status: DC
  Administered 2016-11-30 – 2016-12-03 (×2): 237 mL via ORAL

## 2016-11-30 NOTE — Progress Notes (Signed)
Nutrition Follow-up  DOCUMENTATION CODES:   Not applicable  INTERVENTION:  Discontinue Ensure.  Provide Glucerna Shake po TID, each supplement provides 220 kcal and 10 grams of protein.  Encourage adequate PO intake.   NUTRITION DIAGNOSIS:   Increased nutrient needs related to wound healing as evidenced by estimated needs; ongoing  GOAL:   Patient will meet greater than or equal to 90% of their needs; progressing  MONITOR:   PO intake, Supplement acceptance, Labs, Skin, Weight trends, I & O's  REASON FOR ASSESSMENT:   Malnutrition Screening Tool    ASSESSMENT:   66 y.o. right handed female diabetes mellitus, fibromyalgia, hypertension, CAD with CABG maintained on aspirin and Plavix, recently stopped smoking 7 months ago, peripheral vascular disease with recent left first and second toe amputations 10/13/2016. Presented 11/19/2016 with surgical wound dehiscence gangrenous changes of left foot. Wound was not felt to be salvageable. Underwent left transmetatarsal amputation 11/19/2016.  Meal completion has been varied from 20-100% with 20-50% intake today. Pt currently has Ensure ordered and has been refusing them recently. RD to modify orders and order Glucerna shake instead to aid in caloric and protein needs.    Diet Order:  Diet Carb Modified Fluid consistency: Thin; Room service appropriate? Yes  EDUCATION NEEDS:   Education needs have been addressed  Skin:  Skin Assessment: Skin Integrity Issues: Skin Integrity Issues:: Incisions Incisions: L foot  Last BM:  11/18  Height:   Ht Readings from Last 1 Encounters:  11/21/16 5\' 3"  (1.6 m)    Weight:   Wt Readings from Last 1 Encounters:  11/25/16 121 lb 9.6 oz (55.2 kg)    Ideal Body Weight:  52.27 kg  BMI:  Body mass index is 21.54 kg/m.  Estimated Nutritional Needs:   Kcal:  1600-1800  Protein:  65-75 grams  Fluid:  1.6 - 1.8 L/day    Roslyn SmilingStephanie Vanity Larsson, MS, RD, LDN Pager # [phone] After  hours/ weekend pager # [phone]

## 2016-11-30 NOTE — Progress Notes (Signed)
Occupational Therapy Weekly Progress Note  Patient Details  Name: Andrea [name] MRN: [id number] Date of Birth: 09-27-1950  Beginning of progress report period: November 21, 2016 End of progress report period: November 30, 2016  Patient has met 4 of 4 short term goals.  Pt made slow but steady progress with BADLs during this past week.  Pt requires occasional steady A during standing activities and BADLs and requires multiple rest breaks during activities.    Patient continues to demonstrate the following deficits: muscle weakness, decreased cardiorespiratoy endurance and decreased standing balance, difficulty maintaining precautions and acute pain  and therefore will continue to benefit from skilled OT intervention to enhance overall performance with BADL, iADL and Reduce care partner burden.  Patient progressing toward long term goals..  Continue plan of care.  OT Short Term Goals Week 1:  OT Short Term Goal 1 (Week 1): Pt will completed toilet transfer with min A  OT Short Term Goal 1 - Progress (Week 1): Met OT Short Term Goal 2 (Week 1): Pt will completed LB dressing with CGA  OT Short Term Goal 2 - Progress (Week 1): Met OT Short Term Goal 3 (Week 1): Pt will completed grooming tasks at sink while standing with minA  OT Short Term Goal 3 - Progress (Week 1): Met OT Short Term Goal 4 (Week 1): Pt will complete shower transfers with min A  OT Short Term Goal 4 - Progress (Week 1): Met Week 2:  OT Short Term Goal 1 (Week 2): STG=LTG secondary to ELOS      Therapy Documentation Precautions:  Precautions Precautions: Fall Restrictions Weight Bearing Restrictions: Yes LLE Weight Bearing: Non weight bearing Other Position/Activity Restrictions: Darco shoe for left foot with weightbearing.  See Function Navigator for Current Functional Status.   Therapy/Group: Individual Therapy  Leroy Libman 11/30/2016, 12:42 PM

## 2016-11-30 NOTE — Progress Notes (Signed)
Physical Therapy Session Note  Patient Details  Name: Andrea [name] MRN: [id number] Date of Birth: 03-Oct-1950  Today's Date: 11/30/2016 PT Individual Time: 1000-1100 PT Individual Time Calculation (min): 60 min   Short Term Goals: Week 1:  PT Short Term Goal 1 (Week 1): Pt will ambulate x 50 ft with min assist and LRAD PT Short Term Goal 1 - Progress (Week 1): Met PT Short Term Goal 2 (Week 1): Pt will perform bed<>chair transfers with supervision PT Short Term Goal 2 - Progress (Week 1): Met PT Short Term Goal 3 (Week 1): Pt will propel w/c x 150 ft with supervision PT Short Term Goal 3 - Progress (Week 1): Met  Skilled Therapeutic Interventions/Progress Updates: Pt presented in bed agreeable to therapy. Pt able to don clothing with set up and increased time. PTA donned Darco shoe for time management. Pt performed stand pivot to w/c and stood at sink brushing teeth with supervision. Pt transported to rehab gym for time management. Performed gait with SPC 68f with minA fading to min guard.  Performed obstacle course weaving through cones, sidestepping, and stepping over thresholds requiring min A for improved safety awareness. Pt required frequent rest breaks due to fatigue. Pt performed standing balance reaching and placing clothes pins on basketball net with no AD. Pt propelled back to room with min guard and increased time due to fatigue. Pt remained in w/c at end of session with call bell within reach to await next therapy session.      Therapy Documentation Precautions:  Precautions Precautions: Fall Restrictions Weight Bearing Restrictions: Yes LLE Weight Bearing: Non weight bearing Other Position/Activity Restrictions: Darco shoe for left foot with weightbearing.  See Function Navigator for Current Functional Status.   Therapy/Group: Individual Therapy  Cate Oravec  Carely Nappier, PTA  11/30/2016, 4:09 PM

## 2016-11-30 NOTE — Progress Notes (Addendum)
Occupational Therapy Session Note  Patient Details  Name: Elizebeth Brookingoni B Swier MRN: [id number] Date of Birth: 1950/03/02  Today's Date: 11/30/2016 OT Individual Time: 1416-1446 OT Individual Time Calculation (min): 30 min    Short Term Goals: Week 2:  OT Short Term Goal 1 (Week 2): STG=LTG secondary to ELOS  Skilled Therapeutic Interventions/Progress Updates:    Pt received sitting up in w/c agreeable to OT treatment session. Focus of session on dynamic standing balance, activity tolerance, functional mobility. Pt propels w/c 50% distance to rehab kitchen, with assist for remaining distance. Pt ambulates throughout kitchen using SPC with close minguard, obtaining and placing items in bowl, reaching above, below, outside BOS. Transported Pt to rehab gym where Pt engaged in standing activity of horseshoes, reaching outside BOS to obtain and toss horseshoes, as well as bending to retrieve horseshoes from floor with close minguard for safety during activity, intermittent minA when obtaining items from floor. Transported Pt back to room via w/c where Pt was left seated in w/c, call bell and needs within reach.   Therapy Documentation Precautions:  Precautions Precautions: Fall Restrictions Weight Bearing Restrictions: (P) Yes LLE Weight Bearing: (P) Non weight bearing Other Position/Activity Restrictions: Darco shoe for left foot with weightbearing.  Pain: Pain Assessment Pain Assessment: No/denies pain  See Function Navigator for Current Functional Status.   Therapy/Group: Individual Therapy  Orlando PennerBreanna L Kaleesi Guyton 11/30/2016, 4:49 PM

## 2016-11-30 NOTE — Progress Notes (Signed)
   Evaluated left tma site and noted to have minimal drainage from mid-lateral suture line. Otherwise tma site appears healthy. There is a palpable AT pulse. Continue dry absorbant dressing and rehab. Plan to see her on 12.7 in the office.   Kalese Ensz C. Randie Heinzain, MD [facility] [place] Office: [phone] Pager: [phone]

## 2016-11-30 NOTE — Progress Notes (Signed)
Physical Therapy Weekly Progress Note  Patient Details  Name: Andrea [name] MRN: [id number] Date of Birth: [birth year]  Beginning of progress report period: November 23, 2016 End of progress report period: November 30, 2016  Today's Date: 11/30/2016 PT Individual Time: 1300-1400 PT Individual Time Calculation (min): 60 min   Patient has met 3 of 3 short term goals.  Patient with steady progress towards long term goals at modI level. Pt has progressed to use of RW to [facility] with min guard/close S overall as RW is not accessible in patients home. Treatment sessions with focus on dynamic balance, ankle/hip strategy, balance on uneven and compliant surfaces, and stepping strategies. Patient continues to be motivated to progress towards goals and return home at modI level with intermittent assistance from family.   Patient continues to demonstrate the following deficits muscle weakness, decreased cardiorespiratoy endurance and decreased sitting balance, decreased standing balance, decreased postural control and decreased balance strategies and therefore will continue to benefit from skilled PT intervention to increase functional independence with mobility.  Patient progressing toward long term goals..  Continue plan of care.  PT Short Term Goals Week 1:  PT Short Term Goal 1 (Week 1): Pt will ambulate x 50 ft with min assist and LRAD PT Short Term Goal 2 (Week 1): Pt will perform bed<>chair transfers with supervision PT Short Term Goal 3 (Week 1): Pt will propel w/c x 150 ft with supervision Week 2:     Skilled Therapeutic Interventions/Progress Updates: Pt received seated in w/c, denies pain and agreeable to treatment. W/c propulsion x75' with BUE and S for strengthening and endurance. Performed ambulatory transfer to car with SPC and min guard, then ambulated up/down ramp and across mulch with min guard. Progressive balance exercises including normal BOS eyes open/closed, narrow BOS feet  together eyes open/closed; min guard overall and several posterior LOBs d/t delayed/inefficient ankle strategy. Standing balance on small red foam wedge, progressed to larger blue foam wedge while performing dynamic UE reaching to retrieve and match cards to board with min guard; posterior LOBs initially, reduced with repetition and improving ankle/righting reactions. Standing balance on airex foam pad while retrieving cards from table behind pt for focus on dynamic trunk movements with ankle/righting reactions; min guard overall. Standing heel raises x15 reps. Side stepping R/L 2x10' with UE support, 1x10' R/L with no UE support min guard. Backwards walking 2x10' with UE support, 2x10' no UE support with min guard. Returned to room totalA in w/c, remained seated in w/c, all needs in reach at completion of session.      Therapy Documentation Precautions:  Precautions Precautions: Fall Restrictions Weight Bearing Restrictions: Yes LLE Weight Bearing: Non weight bearing Other Position/Activity Restrictions: Darco shoe for left foot with weightbearing.   See Function Navigator for Current Functional Status.  Therapy/Group: Individual Therapy  Luberta Mutter 11/30/2016, 1:51 PM

## 2016-11-30 NOTE — Progress Notes (Signed)
Patient refuse dressing change at this time

## 2016-11-30 NOTE — Progress Notes (Signed)
Occupational Therapy Session Note  Patient Details  Name: Andrea [name] MRN: [id number] Date of Birth: 01-May-1950  Today's Date: 11/30/2016 OT Individual Time: 1100-1155 OT Individual Time Calculation (min): 55 min    Short Term Goals: Week 1:  OT Short Term Goal 1 (Week 1): Pt will completed toilet transfer with min A  OT Short Term Goal 2 (Week 1): Pt will completed LB dressing with CGA  OT Short Term Goal 3 (Week 1): Pt will completed grooming tasks at sink while standing with minA  OT Short Term Goal 4 (Week 1): Pt will complete shower transfers with min A   Skilled Therapeutic Interventions/Progress Updates:    Pt engaged in dynamic standing balance activities including horseshoes, retrieving items off floor, reaching outside BOS, folding towels.  Pt also [facility] for simple home mgmt tasks.  Pt completed all tasks at supervision level. Pt propelled w/c back to room and remained n w/c with all needs within reach.   Therapy Documentation Precautions:  Precautions Precautions: Fall Restrictions Weight Bearing Restrictions: Yes LLE Weight Bearing: Non weight bearing Other Position/Activity Restrictions: Darco shoe for left foot with weightbearing.  Pain:  Pt denies pain See Function Navigator for Current Functional Status.   Therapy/Group: Individual Therapy  Rich BraveLanier, Concepcion Kirkpatrick Chappell 11/30/2016, 12:08 PM

## 2016-11-30 NOTE — Progress Notes (Signed)
Subjective/Complaints: No new issues. Feeling ok. About to eat breakfast  ROS: pt denies nausea, vomiting, diarrhea, cough, shortness of breath or chest pain    Objective: Vital Signs: Blood pressure (!) 147/50, pulse 64, temperature 99.3 F (37.4 C), temperature source Oral, resp. rate 18, height 5' 3" (1.6 m), weight 55.2 kg (121 lb 9.6 oz), SpO2 100 %. No results found. Results for orders placed or performed during the hospital encounter of 11/21/16 (from the past 72 hour(s))  Glucose, capillary     Status: Abnormal   Collection Time: 11/27/16 11:55 AM  Result Value Ref Range   Glucose-Capillary 163 (H) 65 - 99 mg/dL  Glucose, capillary     Status: Abnormal   Collection Time: 11/27/16 12:32 PM  Result Value Ref Range   Glucose-Capillary 163 (H) 65 - 99 mg/dL  Glucose, capillary     Status: Abnormal   Collection Time: 11/27/16  5:05 PM  Result Value Ref Range   Glucose-Capillary 118 (H) 65 - 99 mg/dL  Glucose, capillary     Status: Abnormal   Collection Time: 11/27/16  9:41 PM  Result Value Ref Range   Glucose-Capillary 176 (H) 65 - 99 mg/dL   Comment 1 Notify RN   Basic metabolic panel     Status: Abnormal   Collection Time: 11/28/16  6:08 AM  Result Value Ref Range   Sodium 141 135 - 145 mmol/L   Potassium 4.0 3.5 - 5.1 mmol/L   Chloride 106 101 - 111 mmol/L   CO2 25 22 - 32 mmol/L   Glucose, Bld 108 (H) 65 - 99 mg/dL   BUN 35 (H) 6 - 20 mg/dL   Creatinine, Ser 1.01 (H) 0.44 - 1.00 mg/dL   Calcium 8.6 (L) 8.9 - 10.3 mg/dL   GFR calc non Af Amer 58 (L) >60 mL/min   GFR calc Af Amer >60 >60 mL/min    Comment: (NOTE) The eGFR has been calculated using the CKD EPI equation. This calculation has not been validated in all clinical situations. eGFR's persistently <60 mL/min signify possible Chronic Kidney Disease.    Anion gap 10 5 - 15  Glucose, capillary     Status: None   Collection Time: 11/28/16  6:46 AM  Result Value Ref Range   Glucose-Capillary 99 65 - 99  mg/dL   Comment 1 Notify RN   Glucose, capillary     Status: Abnormal   Collection Time: 11/28/16 12:19 PM  Result Value Ref Range   Glucose-Capillary 147 (H) 65 - 99 mg/dL  Glucose, capillary     Status: None   Collection Time: 11/28/16  5:05 PM  Result Value Ref Range   Glucose-Capillary 98 65 - 99 mg/dL  Glucose, capillary     Status: Abnormal   Collection Time: 11/28/16  9:28 PM  Result Value Ref Range   Glucose-Capillary 150 (H) 65 - 99 mg/dL   Comment 1 Notify RN   Glucose, capillary     Status: None   Collection Time: 11/29/16  6:30 AM  Result Value Ref Range   Glucose-Capillary 91 65 - 99 mg/dL   Comment 1 Notify RN   Glucose, capillary     Status: None   Collection Time: 11/29/16 11:45 AM  Result Value Ref Range   Glucose-Capillary 93 65 - 99 mg/dL  Glucose, capillary     Status: Abnormal   Collection Time: 11/29/16  4:32 PM  Result Value Ref Range   Glucose-Capillary 110 (H) 65 - 99 mg/dL  Glucose, capillary     Status: Abnormal   Collection Time: 11/29/16  8:45 PM  Result Value Ref Range   Glucose-Capillary 170 (H) 65 - 99 mg/dL  Glucose, capillary     Status: None   Collection Time: 11/30/16  6:32 AM  Result Value Ref Range   Glucose-Capillary 86 65 - 99 mg/dL   Comment 1 Notify RN      HEENT: Normocephalic, atraumatic. Cardio: RRR without murmur. No JVD   Resp: CTA Bilaterally without wheezes or rales. Normal effort    GI: BS positive and nondistended Skin:   Left TMA incision clean/generally dry. Neuro: Alert/Oriented Motor 5/5 bilateral deltoids, biceps, triceps, grip  4/5 in the right hip flexion, knee extensor, 3-/5 ankle dorsi/plantar flexors  4-/5 left hip flexion, knee extension (stable) Musc/Skel: Left foot tenderness to palpation Gen. no acute distress.  Vitals signs reviewed   Assessment/Plan: 1. Functional deficits secondary to left TMA, nonweightbearing which require 3+ hours per day of interdisciplinary therapy in a comprehensive inpatient  rehab setting. Physiatrist is providing close team supervision and 24 hour management of active medical problems listed below. Physiatrist and rehab team continue to assess barriers to discharge/monitor patient progress toward functional and medical goals. FIM: Function - Bathing Position: Wheelchair/chair at sink Body parts bathed by patient: Right arm, Left arm, Chest, Right upper leg, Left upper leg, Right lower leg, Left lower leg, Abdomen, Front perineal area, Buttocks Body parts bathed by helper: Back Assist Level: Touching or steadying assistance(Pt > 75%)  Function- Upper Body Dressing/Undressing What is the patient wearing?: Pull over shirt/dress Bra - Perfomed by patient: Thread/unthread left bra strap, Thread/unthread right bra strap Bra - Perfomed by helper: Hook/unhook bra (pull down sports bra) Pull over shirt/dress - Perfomed by patient: Thread/unthread right sleeve, Thread/unthread left sleeve, Put head through opening, Pull shirt over trunk Assist Level: Set up, Touching or steadying assistance(Pt > 75%) Set up : To obtain clothing/put away Function - Lower Body Dressing/Undressing What is the patient wearing?: Pants, Shoes, Underwear Position: Wheelchair/chair at sink Underwear - Performed by patient: Thread/unthread right underwear leg, Thread/unthread left underwear leg, Pull underwear up/down Pants- Performed by patient: Thread/unthread right pants leg, Thread/unthread left pants leg, Fasten/unfasten pants, Pull pants up/down Non-skid slipper socks- Performed by patient: Don/doff right sock Shoes - Performed by patient: Don/doff right shoe Shoes - Performed by helper: Don/doff left shoe Assist for footwear: Supervision/touching assist Assist for lower body dressing: Supervision or verbal cues, Set up  Function - Toileting Toileting steps completed by patient: Adjust clothing prior to toileting, Performs perineal hygiene, Adjust clothing after toileting Toileting  Assistive Devices: Grab bar or rail Assist level: Supervision or verbal cues  Function - Air cabin crew transfer assistive device: Landscape architect, Pension scheme manager level to toilet: Supervision or verbal cues Assist level from toilet: Supervision or verbal cues  Function - Chair/bed transfer Chair/bed transfer method: Stand pivot Chair/bed transfer assist level: Touching or steadying assistance (Pt > 75%) Chair/bed transfer assistive device: Armrests, Cane Chair/bed transfer details: Verbal cues for sequencing  Function - Locomotion: Wheelchair Will patient use wheelchair at discharge?: Yes Type: Manual Max wheelchair distance: 228f  Assist Level: Supervision or verbal cues Assist Level: Supervision or verbal cues Assist Level: Supervision or verbal cues Turns around,maneuvers to table,bed, and toilet,negotiates 3% grade,maneuvers on rugs and over doorsills: No Function - Locomotion: Ambulation Assistive device: Cane-straight Max distance: 50 ft Assist level: Touching or steadying assistance (Pt > 75%) Assist level: Touching or steadying assistance (  Pt > 75%) Walk 50 feet with 2 turns activity did not occur: Safety/medical concerns Assist level: Touching or steadying assistance (Pt > 75%) Walk 150 feet activity did not occur: Safety/medical concerns Walk 10 feet on uneven surfaces activity did not occur: Safety/medical concerns  Function - Comprehension Comprehension: Auditory Comprehension assist level: Follows complex conversation/direction with extra time/assistive device  Function - Expression Expression: Verbal Expression assist level: Expresses complex ideas: With no assist  Function - Social Interaction Social Interaction assist level: Interacts appropriately 90% of the time - Needs monitoring or encouragement for participation or interaction.  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 DVT Prophylaxis/Anticoagulation: Subcutaneous heparin. Monitor for any bleeding episodes 3. Pain Management:Cymbalta 60 mg daily, Neurontin 300 mg 3 times a day, hydrocodone as needed  -Pain controlled at present  4. Mood:Provide emotional support 5. Neuropsych: This patientiscapable of making decisions on herown behalf. 6. Skin/Wound Care:Routine skin checks 7. Fluids/Electrolytes/Nutrition:  -Push p.o. Fluids. IVF stopped over weekend.   -Continue to hold Lasix,  Holding HCTZ as well 8.Acute blood loss anemia.      hemoglobin 8.1 on 11/26/2016.     Continue to monitor 9.Diabetes mellitus peripheral neuropathy hemoglobin A1c 7.3. Lantus insulin 30 units daily at bedtime, Glucophage 1000 mg twice a day.Diabetic teaching Reduced Lantus to 25 units nightly  Relatively controlled 11/18 10.Hypertension.(HCTZ 25 mg daily, Lasix 10 mg daily), lisinopril 5 mg daily, Lopressor 12.5 mg twice a day Vitals:   11/29/16 2200 11/30/16 0447  BP: (!) 146/56 (!) 147/50  Pulse: 71 64  Resp:  18  Temp:  99.3 F (37.4 C)  SpO2: 100% 100%    -Lasix held due to elevation of her BUN and creatinine.  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 Continue aspirin and Plavix. No chest pain or shortness of breath 12.Hyperlipidemia. Lipitor 13.Tobacco abuse. Patient recently stopped smoking 7 months ago. Provide counseling 14. AKI   Cr 1.01 on 11/17   Stable, recheck tomorrow  LOS (Days) 9 A FACE TO FACE EVALUATION WAS  PERFORMED  SWARTZ,ZACHARY T 11/30/2016, 9:03 AM

## 2016-12-01 ENCOUNTER — Inpatient Hospital Stay (HOSPITAL_COMMUNITY): Payer: BLUE CROSS/BLUE SHIELD

## 2016-12-01 ENCOUNTER — Telehealth: Payer: Self-pay | Admitting: Family Medicine

## 2016-12-01 ENCOUNTER — Inpatient Hospital Stay (HOSPITAL_COMMUNITY): Payer: BLUE CROSS/BLUE SHIELD | Admitting: Physical Therapy

## 2016-12-01 ENCOUNTER — Inpatient Hospital Stay (HOSPITAL_COMMUNITY): Payer: BLUE CROSS/BLUE SHIELD | Admitting: Occupational Therapy

## 2016-12-01 LAB — BASIC METABOLIC PANEL
ANION GAP: 9 (ref 5–15)
BUN: 28 mg/dL — ABNORMAL HIGH (ref 6–20)
CO2: 24 mmol/L (ref 22–32)
Calcium: 8.7 mg/dL — ABNORMAL LOW (ref 8.9–10.3)
Chloride: 109 mmol/L (ref 101–111)
Creatinine, Ser: 0.9 mg/dL (ref 0.44–1.00)
GFR calc Af Amer: >60 mL/min (ref >60)
GFR calc non Af Amer: >60 mL/min (ref >60)
GLUCOSE: 113 mg/dL — AB (ref 65–99)
POTASSIUM: 3.7 mmol/L (ref 3.5–5.1)
Sodium: 142 mmol/L (ref 135–145)

## 2016-12-01 LAB — GLUCOSE, CAPILLARY
GLUCOSE-CAPILLARY: 109 mg/dL — AB (ref 65–99)
Glucose-Capillary: 140 mg/dL — ABNORMAL HIGH (ref 65–99)
Glucose-Capillary: 137 mg/dL — ABNORMAL HIGH (ref 65–99)
Glucose-Capillary: 234 mg/dL — ABNORMAL HIGH (ref 65–99)

## 2016-12-01 MED ORDER — WHITE PETROLATUM EX OINT
TOPICAL_OINTMENT | CUTANEOUS | Status: AC
  Filled 2016-12-01: qty 28.35

## 2016-12-01 MED ORDER — ACETAMINOPHEN 325 MG PO TABS
650.0000 mg | ORAL_TABLET | Freq: Four times a day (QID) | ORAL | Status: DC | PRN
  Administered 2016-12-01: 650 mg via ORAL
  Filled 2016-12-01: qty 2

## 2016-12-01 NOTE — Progress Notes (Signed)
Occupational Therapy Session Note  Patient Details  Name: Elizebeth Brookingoni B Loftus MRN: [id number] Date of Birth: 11/29/1950  Today's Date: 12/01/2016 OT Individual Time: [id number] OT Individual Time Calculation (min): 55 min    Short Term Goals: Week 2:  OT Short Term Goal 1 (Week 2): STG=LTG secondary to ELOS Week 3:     Skilled Therapeutic Interventions/Progress Updates:    1:1 Focus on education on proper foot care for left foot including skin care, washing foot and review of bandaging. Pt reports more nerve pain today compared to the past. RN aware.  Therapy Documentation Precautions:  Precautions Precautions: Fall Restrictions Weight Bearing Restrictions: Yes LLE Weight Bearing: Non weight bearing Other Position/Activity Restrictions: Darco shoe for left foot with weightbearing. General:  Pain: Pain Assessment Pain Assessment: 0-10 Pain Score: 7  Pain Type: Surgical pain;Neuropathic pain;Phantom pain Pain Location: Foot Pain Orientation: Left Pain Descriptors / Indicators: Dull;Aching;Sore;Stabbing Pain Onset: On-going Patients Stated Pain Goal: 2 Pain Intervention(s): Medication (See eMAR);MD notified (Comment) Multiple Pain Sites: No  See Function Navigator for Current Functional Status.   Therapy/Group: Individual Therapy  Roney MansSmith, Leylanie Woodmansee [facility] 12/01/2016, 2:36 PM

## 2016-12-01 NOTE — Telephone Encounter (Signed)
We will address once discharged

## 2016-12-01 NOTE — Progress Notes (Signed)
Pt resting in bed quietly. Easily aroused. Able to make needs known. Denies any pain at this time. Left foot with removed drsg and reapplied per order. Left foot amputation is without any noted s/s of infection. Safety maintained. Will continue to monitor.

## 2016-12-01 NOTE — Progress Notes (Signed)
Physical Therapy Session Note  Patient Details  Name: Andrea [name] MRN: [id number] Date of Birth: 1950-07-12  Today's Date: 12/01/2016 PT Individual Time: 1600-1630 PT Individual Time Calculation (min): 30 min   Short Term Goals: Week 2:  PT Short Term Goal 1 (Week 2): =LTG due to estimated LOS  Skilled Therapeutic Interventions/Progress Updates:    Pt supine in bed upon PT arrival, agreeable to therapy tx and reports pain 9/10 in her foot. Pt transferred supine to sitting Mod I. Pt transferred bed>w/c with supervision, stand pivot. Pt propelled w/c to the dayroom instead of ambulation secondary to LE pain, supervision. Once in the gym, pt agreeable to ambulation. Pt ambulated 2 x 65 ft with SPC and min assist for balance. Pt worked on dynamic standing balance without UE support in order to play checkers, keeping most of her weight shifted onto R LE to minimize pain. Pt left seated in w/c at end of session with needs in reach.   Therapy Documentation Precautions:  Precautions Precautions: Fall Restrictions Weight Bearing Restrictions: Yes LLE Weight Bearing: Non weight bearing Other Position/Activity Restrictions: Darco shoe for left foot with weightbearing.  See Function Navigator for Current Functional Status.   Therapy/Group: Individual Therapy  Cresenciano GenreEmily van Schagen, PT, DPT 12/01/2016, 1:01 PM

## 2016-12-01 NOTE — Progress Notes (Signed)
Occupational Therapy Session Note  Patient Details  Name: Andrea [name] MRN: [id number] Date of Birth: [birth year]  Today's Date: 12/01/2016 OT Individual Time: [id number] OT Individual Time Calculation (min): 55 min    Short Term Goals: Week 2:  OT Short Term Goal 1 (Week 2): STG=LTG secondary to ELOS  Skilled Therapeutic Interventions/Progress Updates:   Focus on simple meal prep, laundry tasks, and simple home mgmt tasks in [place]L apartment.  Pt amb with SPC in kitchen to gather supplies and prepare oatmeal in microwave oven.  No safety issues noted.  Discussed laundry arrangements at home and made recommendations.  Pt in agreement.  Pt completed simple home mgmt tasks at supervision level.  Pt also engaged in functional amb with SPC in cluttered environment at supervision level. Pt returned to room and remained in w/c with all needs within reach.   Therapy Documentation Precautions:  Precautions Precautions: Fall Restrictions Weight Bearing Restrictions: Yes LLE Weight Bearing: Non weight bearing Other Position/Activity Restrictions: Darco shoe for left foot with weightbearing.  Pain: Pain Assessment Pain Assessment: 0-10 Pain Score: 7  Pain Type: Surgical pain;Neuropathic pain;Phantom pain Pain Location: Foot Pain Orientation: Left Pain Descriptors / Indicators: Dull;Aching;Sore;Stabbing Pain Onset: On-going Patients Stated Pain Goal: 2 Pain Intervention(s): Medication (See eMAR);MD notified (Comment) Multiple Pain Sites: No  See Function Navigator for Current Functional Status.   Therapy/Group: Individual Therapy  Rich BraveLanier, Solstice Lastinger Chappell 12/01/2016, 12:08 PM

## 2016-12-01 NOTE — Telephone Encounter (Signed)
Pt requested a refill for  -Insulin Glargine (LANTUS SOLOSTAR) 100 UNIT/ML Solostar Pen  -insulin aspart (novoLOG) injection 0-9 Units   -insulin glargine (LANTUS) injection 25 Units    Please follow up

## 2016-12-01 NOTE — Telephone Encounter (Signed)
I am not sure that this is appropriate right now. She is hospitalized and they are adjusting her insulin doses inpatient. Will forward to PCP

## 2016-12-01 NOTE — Progress Notes (Signed)
Subjective/Complaints:   Lying in bed.  Just waking up.  Slept well  ROS: pt denies nausea, vomiting, diarrhea, cough, shortness of breath or chest pain   Objective: Vital Signs: Blood pressure (!) 146/45, pulse 66, temperature 98.1 F (36.7 C), temperature source Oral, resp. rate 18, height 5' 3"  (1.6 m), weight 55.2 kg (121 lb 9.6 oz), SpO2 98 %. No results found. Results for orders placed or performed during the hospital encounter of 11/21/16 (from the past 72 hour(s))  Glucose, capillary     Status: Abnormal   Collection Time: 11/28/16 12:19 PM  Result Value Ref Range   Glucose-Capillary 147 (H) 65 - 99 mg/dL  Glucose, capillary     Status: None   Collection Time: 11/28/16  5:05 PM  Result Value Ref Range   Glucose-Capillary 98 65 - 99 mg/dL  Glucose, capillary     Status: Abnormal   Collection Time: 11/28/16  9:28 PM  Result Value Ref Range   Glucose-Capillary 150 (H) 65 - 99 mg/dL   Comment 1 Notify RN   Glucose, capillary     Status: None   Collection Time: 11/29/16  6:30 AM  Result Value Ref Range   Glucose-Capillary 91 65 - 99 mg/dL   Comment 1 Notify RN   Glucose, capillary     Status: None   Collection Time: 11/29/16 11:45 AM  Result Value Ref Range   Glucose-Capillary 93 65 - 99 mg/dL  Glucose, capillary     Status: Abnormal   Collection Time: 11/29/16  4:32 PM  Result Value Ref Range   Glucose-Capillary 110 (H) 65 - 99 mg/dL  Glucose, capillary     Status: Abnormal   Collection Time: 11/29/16  8:45 PM  Result Value Ref Range   Glucose-Capillary 170 (H) 65 - 99 mg/dL  Glucose, capillary     Status: None   Collection Time: 11/30/16  6:32 AM  Result Value Ref Range   Glucose-Capillary 86 65 - 99 mg/dL   Comment 1 Notify RN   Glucose, capillary     Status: Abnormal   Collection Time: 11/30/16 11:48 AM  Result Value Ref Range   Glucose-Capillary 102 (H) 65 - 99 mg/dL  Glucose, capillary     Status: Abnormal   Collection Time: 11/30/16  4:56 PM  Result  Value Ref Range   Glucose-Capillary 103 (H) 65 - 99 mg/dL  Glucose, capillary     Status: Abnormal   Collection Time: 11/30/16  8:33 PM  Result Value Ref Range   Glucose-Capillary 122 (H) 65 - 99 mg/dL  Basic metabolic panel     Status: Abnormal   Collection Time: 12/01/16  5:31 AM  Result Value Ref Range   Sodium 142 135 - 145 mmol/L   Potassium 3.7 3.5 - 5.1 mmol/L   Chloride 109 101 - 111 mmol/L   CO2 24 22 - 32 mmol/L   Glucose, Bld 113 (H) 65 - 99 mg/dL   BUN 28 (H) 6 - 20 mg/dL   Creatinine, Ser 0.90 0.44 - 1.00 mg/dL   Calcium 8.7 (L) 8.9 - 10.3 mg/dL   GFR calc non Af Amer >60 >60 mL/min   GFR calc Af Amer >60 >60 mL/min    Comment: (NOTE) The eGFR has been calculated using the CKD EPI equation. This calculation has not been validated in all clinical situations. eGFR's persistently <60 mL/min signify possible Chronic Kidney Disease.    Anion gap 9 5 - 15  Glucose, capillary  Status: Abnormal   Collection Time: 12/01/16  6:33 AM  Result Value Ref Range   Glucose-Capillary 109 (H) 65 - 99 mg/dL   Comment 1 Notify RN      HEENT: Normocephalic, atraumatic. Cardio: RRR without murmur. No JVD    Resp: CTA Bilaterally without wheezes or rales. Normal effort   GI: BS positive and nondistended Skin:    Left TMA incision clean and intact. Neuro: Alert/Oriented Motor 5/5 bilateral deltoids, biceps, triceps, grip  4/5 in the right hip flexion, knee extensor, 3- to 3/5 ankle dorsi/plantar flexors  4-/5 left hip flexion, knee extension--no changes Musc/Skel: Left foot tenderness to palpation Gen. no acute distress.  Vitals signs reviewed   Assessment/Plan: 1. Functional deficits secondary to left TMA, nonweightbearing which require 3+ hours per day of interdisciplinary therapy in a comprehensive inpatient rehab setting. Physiatrist is providing close team supervision and 24 hour management of active medical problems listed below. Physiatrist and rehab team continue to  assess barriers to discharge/monitor patient progress toward functional and medical goals. FIM: Function - Bathing Position: Wheelchair/chair at sink Body parts bathed by patient: Right arm, Left arm, Chest, Right upper leg, Left upper leg, Right lower leg, Left lower leg, Abdomen, Front perineal area, Buttocks Body parts bathed by helper: Back Assist Level: Supervision or verbal cues  Function- Upper Body Dressing/Undressing What is the patient wearing?: Pull over shirt/dress Bra - Perfomed by patient: Thread/unthread left bra strap, Thread/unthread right bra strap, Hook/unhook bra (pull down sports bra) Bra - Perfomed by helper: Hook/unhook bra (pull down sports bra) Pull over shirt/dress - Perfomed by patient: Thread/unthread right sleeve, Thread/unthread left sleeve, Put head through opening, Pull shirt over trunk Assist Level: Set up, More than reasonable time Set up : To obtain clothing/put away Function - Lower Body Dressing/Undressing What is the patient wearing?: Pants, Underwear, Shoes Position: Wheelchair/chair at sink Underwear - Performed by patient: Thread/unthread right underwear leg, Thread/unthread left underwear leg, Pull underwear up/down Pants- Performed by patient: Thread/unthread right pants leg, Thread/unthread left pants leg, Fasten/unfasten pants, Pull pants up/down Non-skid slipper socks- Performed by patient: Don/doff right sock Shoes - Performed by patient: Don/doff left shoe, Don/doff right shoe Shoes - Performed by helper: Don/doff left shoe Assist for footwear: Supervision/touching assist Assist for lower body dressing: Supervision or verbal cues, Set up  Function - Toileting Toileting steps completed by patient: Adjust clothing prior to toileting, Performs perineal hygiene, Adjust clothing after toileting Toileting Assistive Devices: Grab bar or rail Assist level: Supervision or verbal cues  Function - Air cabin crew transfer assistive device:  Grab bar, Cane Assist level to toilet: Supervision or verbal cues Assist level from toilet: Supervision or verbal cues  Function - Chair/bed transfer Chair/bed transfer method: Stand pivot Chair/bed transfer assist level: Supervision or verbal cues Chair/bed transfer assistive device: Armrests Chair/bed transfer details: Verbal cues for precautions/safety  Function - Locomotion: Wheelchair Will patient use wheelchair at discharge?: Yes Type: Manual Max wheelchair distance: 246f  Assist Level: Supervision or verbal cues Assist Level: Supervision or verbal cues Assist Level: Supervision or verbal cues Turns around,maneuvers to table,bed, and toilet,negotiates 3% grade,maneuvers on rugs and over doorsills: No Function - Locomotion: Ambulation Assistive device: Cane-straight Max distance: 95 Assist level: Touching or steadying assistance (Pt > 75%) Assist level: Touching or steadying assistance (Pt > 75%) Walk 50 feet with 2 turns activity did not occur: Safety/medical concerns Assist level: Touching or steadying assistance (Pt > 75%) Walk 150 feet activity did not occur: Safety/medical  concerns Walk 10 feet on uneven surfaces activity did not occur: Safety/medical concerns Assist level: Touching or steadying assistance (Pt > 75%)  Function - Comprehension Comprehension: Auditory Comprehension assist level: Follows complex conversation/direction with extra time/assistive device  Function - Expression Expression: Verbal Expression assist level: Expresses complex ideas: With no assist  Function - Social Interaction Social Interaction assist level: Interacts appropriately 90% of the time - Needs monitoring or encouragement for participation or interaction.  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 Team conf today 2. DVT Prophylaxis/Anticoagulation: Subcutaneous heparin. Monitor for any bleeding episodes 3. Pain Management:Cymbalta 60 mg daily, Neurontin 300 mg 3 times a day, hydrocodone as needed  -Pain controlled at present  4. Mood:Provide emotional support 5. Neuropsych: This patientiscapable of making decisions on herown behalf. 6. Skin/Wound Care:Routine skin checks 7. Fluids/Electrolytes/Nutrition:  -BUN and creatinine showing some improvement since Friday  -Push p.o. Fluids.     -Continue to hold Lasix,  Holding HCTZ as well 8.Acute blood loss anemia.      hemoglobin 8.1 on 11/26/2016.     -Recheck Friday  9.Diabetes mellitus peripheral neuropathy hemoglobin A1c 7.3. Lantus insulin 30 units daily at bedtime, Glucophage 1000 mg twice a day.Diabetic teaching Reduced Lantus to 25 units nightly  Relatively controlled 11/20---cover spikes with ssi 10.Hypertension.(HCTZ 25 mg daily, Lasix 10 mg daily), lisinopril 5 mg daily, Lopressor 12.5 mg twice a day Vitals:   12/01/16 0536 12/01/16 0740  BP: (!) 146/45   Pulse: 66   Resp: 18   Temp: 98.1 F (36.7 C)   SpO2: 100% 98%    -Lasix held due to elevation of her BUN and creatinine.  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 Continue aspirin and Plavix. No chest pain or shortness of breath 12.Hyperlipidemia. Lipitor 13.Tobacco abuse. Patient recently stopped smoking 7 months ago. Provide counseling 14. AKI   Cr 0.9 today  LOS (Days) 10 A FACE TO FACE EVALUATION WAS PERFORMED  Legend Pecore T 12/01/2016, 8:51 AM

## 2016-12-01 NOTE — Progress Notes (Signed)
Physical Therapy Session Note  Patient Details  Name: Andrea [name] B Gotham MRN: [id number] Date of Birth: Oct 18, 1950  Today's Date: 12/01/2016 PT Individual Time: 0800-0900 PT Individual Time Calculation (min): 60 min   Short Term Goals: Week 2:  PT Short Term Goal 1 (Week 2): =LTG due to estimated LOS  Skilled Therapeutic Interventions/Progress Updates: Pt received asleep in bed, easily awoken and agreeable to treatment. Supine>sit with modI, increased time as pt still waking up. Pt ate breakfast on EOB with modI. While pt eating discussed follow up therapy and recommendation for home health PT to continue progressing strength, balance, and activity tolerance. Also discussed possibility of home health nursing for follow up with surgical site; pt reports she plans to do her own dressing changes. Will discuss with team. Pt requesting to perform bathing and dressing in this session instead of with female OT in next session. Stand pivot modI to w/c. Pt retrieves clothing from w/c level modI. Seated at sink pt performs upper/lower body bathing, washing hair, with setupA to obtain items; S for standing balance. Requires cues for donning darco shoe prior to ambulating into bathroom. Upper and lower body dressing performed with S and increased time. Performed oral hygiene, drying hair while seated at sink with modI. Remained seated in w/c at end of session, all needs in reach.      Therapy Documentation Precautions:  Precautions Precautions: Fall Restrictions Weight Bearing Restrictions: Yes LLE Weight Bearing: Non weight bearing Other Position/Activity Restrictions: Darco shoe for left foot with weightbearing.   See Function Navigator for Current Functional Status.   Therapy/Group: Individual Therapy  Vista Lawmanlizabeth J Tygielski 12/01/2016, 8:44 AM

## 2016-12-02 ENCOUNTER — Inpatient Hospital Stay (HOSPITAL_COMMUNITY): Payer: BLUE CROSS/BLUE SHIELD | Admitting: Physical Therapy

## 2016-12-02 ENCOUNTER — Inpatient Hospital Stay (HOSPITAL_COMMUNITY): Payer: BLUE CROSS/BLUE SHIELD

## 2016-12-02 LAB — GLUCOSE, CAPILLARY
GLUCOSE-CAPILLARY: 98 mg/dL (ref 65–99)
GLUCOSE-CAPILLARY: 121 mg/dL — AB (ref 65–99)
GLUCOSE-CAPILLARY: 127 mg/dL — AB (ref 65–99)
Glucose-Capillary: 110 mg/dL — ABNORMAL HIGH (ref 65–99)

## 2016-12-02 MED ORDER — PREGABALIN 50 MG PO CAPS
100.0000 mg | ORAL_CAPSULE | Freq: Three times a day (TID) | ORAL | Status: DC
  Administered 2016-12-02 – 2016-12-05 (×9): 100 mg via ORAL
  Filled 2016-12-02 (×9): qty 2

## 2016-12-02 NOTE — Progress Notes (Signed)
Subjective/Complaints:   Getting ready to get up with OT. Complaining of phantom pain where her big toe once was  ROS: pt denies nausea, vomiting, diarrhea, cough, shortness of breath or chest pain    Objective: Vital Signs: Blood pressure (!) 150/50, pulse 80, temperature 98.2 F (36.8 C), temperature source Oral, resp. rate 18, height _0  (1.6 m), weight 55.2 kg (121 lb 9.6 oz), SpO2 100 %. No results found. Results for orders placed or performed during the hospital encounter of 11/21/16 (from the past 72 hour(s))  Glucose, capillary     Status: None   Collection Time: 11/29/16 11:45 AM  Result Value Ref Range   Glucose-Capillary 93 65 - 99 mg/dL  Glucose, capillary     Status: Abnormal   Collection Time: 11/29/16  4:32 PM  Result Value Ref Range   Glucose-Capillary 110 (H) 65 - 99 mg/dL  Glucose, capillary     Status: Abnormal   Collection Time: 11/29/16  8:45 PM  Result Value Ref Range   Glucose-Capillary 170 (H) 65 - 99 mg/dL  Glucose, capillary     Status: None   Collection Time: 11/30/16  6:32 AM  Result Value Ref Range   Glucose-Capillary 86 65 - 99 mg/dL   Comment 1 Notify RN   Glucose, capillary     Status: Abnormal   Collection Time: 11/30/16 11:48 AM  Result Value Ref Range   Glucose-Capillary 102 (H) 65 - 99 mg/dL  Glucose, capillary     Status: Abnormal   Collection Time: 11/30/16  4:56 PM  Result Value Ref Range   Glucose-Capillary 103 (H) 65 - 99 mg/dL  Glucose, capillary     Status: Abnormal   Collection Time: 11/30/16  8:33 PM  Result Value Ref Range   Glucose-Capillary 122 (H) 65 - 99 mg/dL  Basic metabolic panel     Status: Abnormal   Collection Time: 12/01/16  5:31 AM  Result Value Ref Range   Sodium 142 135 - 145 mmol/L   Potassium 3.7 3.5 - 5.1 mmol/L   Chloride 109 101 - 111 mmol/L   CO2 24 22 - 32 mmol/L   Glucose, Bld 113 (H) 65 - 99 mg/dL   BUN 28 (H) 6 - 20 mg/dL   Creatinine, Ser 0.90 0.44 - 1.00 mg/dL   Calcium 8.7 (L) 8.9 - 10.3  mg/dL   GFR calc non Af Amer >60 >60 mL/min   GFR calc Af Amer >60 >60 mL/min    Comment: (NOTE) The eGFR has been calculated using the CKD EPI equation. This calculation has not been validated in all clinical situations. eGFR's persistently <60 mL/min signify possible Chronic Kidney Disease.    Anion gap 9 5 - 15  Glucose, capillary     Status: Abnormal   Collection Time: 12/01/16  6:33 AM  Result Value Ref Range   Glucose-Capillary 109 (H) 65 - 99 mg/dL   Comment 1 Notify RN   Glucose, capillary     Status: Abnormal   Collection Time: 12/01/16 11:46 AM  Result Value Ref Range   Glucose-Capillary 140 (H) 65 - 99 mg/dL  Glucose, capillary     Status: Abnormal   Collection Time: 12/01/16  4:39 PM  Result Value Ref Range   Glucose-Capillary 137 (H) 65 - 99 mg/dL  Glucose, capillary     Status: Abnormal   Collection Time: 12/01/16  8:19 PM  Result Value Ref Range   Glucose-Capillary 234 (H) 65 - 99 mg/dL  Glucose, capillary  Status: None   Collection Time: 12/02/16  6:58 AM  Result Value Ref Range   Glucose-Capillary 98 65 - 99 mg/dL     HEENT: Normocephalic, atraumatic. Cardio: RRR without murmur. No JVD     Resp: CTA Bilaterally without wheezes or rales. Normal effort    GI: BS positive and nondistended Skin:    Left TMA incision clean and intact, with minimal s/s drainage Neuro: Alert/Oriented Motor 5/5 bilateral deltoids, biceps, triceps, grip  4/5 in the right hip flexion, knee extensor,   3/5 ankle dorsi/plantar flexors  4-/5 left hip flexion, knee extension--no changes Musc/Skel: Left foot tenderness to palpation Gen. NAD   Assessment/Plan: 1. Functional deficits secondary to left TMA, nonweightbearing which require 3+ hours per day of interdisciplinary therapy in a comprehensive inpatient rehab setting. Physiatrist is providing close team supervision and 24 hour management of active medical problems listed below. Physiatrist and rehab team continue to assess  barriers to discharge/monitor patient progress toward functional and medical goals. FIM: Function - Bathing Position: Wheelchair/chair at sink Body parts bathed by patient: Right arm, Left arm, Chest, Right upper leg, Left upper leg, Right lower leg, Left lower leg, Abdomen, Front perineal area, Buttocks Body parts bathed by helper: Back Assist Level: Supervision or verbal cues  Function- Upper Body Dressing/Undressing What is the patient wearing?: Pull over shirt/dress, Bra Bra - Perfomed by patient: Thread/unthread left bra strap, Thread/unthread right bra strap, Hook/unhook bra (pull down sports bra) Bra - Perfomed by helper: Hook/unhook bra (pull down sports bra) Pull over shirt/dress - Perfomed by patient: Thread/unthread right sleeve, Thread/unthread left sleeve, Put head through opening, Pull shirt over trunk Assist Level: Supervision or verbal cues Set up : To obtain clothing/put away Function - Lower Body Dressing/Undressing What is the patient wearing?: Non-skid slipper socks Position: Sitting EOB Underwear - Performed by patient: Thread/unthread right underwear leg, Thread/unthread left underwear leg, Pull underwear up/down Pants- Performed by patient: Thread/unthread right pants leg, Thread/unthread left pants leg, Pull pants up/down, Fasten/unfasten pants Non-skid slipper socks- Performed by patient: Don/doff right sock Shoes - Performed by patient: Don/doff left shoe, Don/doff right shoe, Fasten left Shoes - Performed by helper: Don/doff left shoe Assist for footwear: Supervision/touching assist Assist for lower body dressing: Supervision or verbal cues, Set up  Function - Toileting Toileting steps completed by patient: Adjust clothing prior to toileting, Performs perineal hygiene, Adjust clothing after toileting Toileting Assistive Devices: Grab bar or rail Assist level: Supervision or verbal cues  Function - Air cabin crew transfer assistive device: Grab  bar Assist level to toilet: No Help, no cues, assistive device, takes more than a reasonable amount of time Assist level from toilet: No Help, no cues, assistive device, takes more than a reasonable amount of time  Function - Chair/bed transfer Chair/bed transfer method: Stand pivot Chair/bed transfer assist level: Supervision or verbal cues Chair/bed transfer assistive device: Armrests Chair/bed transfer details: Verbal cues for precautions/safety  Function - Locomotion: Wheelchair Will patient use wheelchair at discharge?: Yes Type: Manual Max wheelchair distance: 80 ft Assist Level: Supervision or verbal cues Assist Level: Supervision or verbal cues Assist Level: Supervision or verbal cues Turns around,maneuvers to table,bed, and toilet,negotiates 3% grade,maneuvers on rugs and over doorsills: No Function - Locomotion: Ambulation Assistive device: Cane-straight Max distance: 80 ft Assist level: Touching or steadying assistance (Pt > 75%) Assist level: Touching or steadying assistance (Pt > 75%) Walk 50 feet with 2 turns activity did not occur: Safety/medical concerns Assist level: Touching or steadying  assistance (Pt > 75%) Walk 150 feet activity did not occur: Safety/medical concerns Walk 10 feet on uneven surfaces activity did not occur: Safety/medical concerns Assist level: Touching or steadying assistance (Pt > 75%)  Function - Comprehension Comprehension: Auditory Comprehension assist level: Follows complex conversation/direction with extra time/assistive device  Function - Expression Expression: Verbal Expression assist level: Expresses complex ideas: With extra time/assistive device  Function - Social Interaction Social Interaction assist level: Interacts appropriately with others - No medications needed.  Function - Problem Solving Problem solving assist level: Solves complex 90% of the time/cues < 10% of the time  Function - Memory Memory assist level:  Recognizes or recalls 90% of the time/requires cueing < 10% of the time Patient normally able to recall (first 3 days only): Current season, Staff names and faces, That he or she is in a hospital, Location of own room  Medical Problem List and Plan: 1.Decreased functional mobilitysecondary to left transmetatarsal amputation 11/19/2016 after recent left first and second toe amputation with gangrenous changes  -Cont CIR. Team conf today 2. DVT Prophylaxis/Anticoagulation: Subcutaneous heparin. Monitor for any bleeding episodes 3. Pain Management:Cymbalta 60 mg daily,   hydrocodone as needed  -having phantom limb pain. Will increase lyrica to 110m TID  4. Mood:Provide emotional support 5. Neuropsych: This patientiscapable of making decisions on herown behalf. 6. Skin/Wound Care:Routine skin checks 7. Fluids/Electrolytes/Nutrition:  -BUN and creatinine showing some improvement    -Push p.o. Fluids.     -Continue to hold Lasix,  Holding HCTZ as well  -recheck labs friday 8.Acute blood loss anemia.      hemoglobin 8.1 on 11/26/2016.     -Recheck Friday  9.Diabetes mellitus peripheral neuropathy hemoglobin A1c 7.3. Lantus insulin 30 units daily at bedtime, Glucophage 1000 mg twice a day.Diabetic teaching Reduced Lantus to 25 units nightly   improved control 10.Hypertension.(HCTZ 25 mg daily, Lasix 10 mg daily), lisinopril 5 mg daily, Lopressor 12.5 mg twice a day Vitals:   12/01/16 1447 12/02/16 0538  BP: (!) 108/37 (!) 150/50  Pulse: 65 80  Resp: 18 18  Temp: 98.4 F (36.9 C) 98.2 F (36.8 C)  SpO2: 100% 100%    -Lasix held due to elevation of her BUN and creatinine.  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 Continue aspirin and Plavix. No chest pain or shortness of breath 12.Hyperlipidemia. Lipitor 13.Tobacco abuse. Patient recently stopped smoking 7 months ago. Provide counseling 14. AKI   Cr 0.9    LOS (Days) 11 A FACE TO FACE  EVALUATION WAS PERFORMED  Andrea [name] T 12/02/2016, 8:42 AM

## 2016-12-02 NOTE — Progress Notes (Signed)
Occupational Therapy Session Note  Patient Details  Name: Elizebeth Brookingoni B Bethard MRN: [id number] Date of Birth: May 28, 1950  Today's Date: 12/02/2016 OT Individual Time: 0700-0757 OT Individual Time Calculation (min): 57 min    Short Term Goals: Week 2:  OT Short Term Goal 1 (Week 2): STG=LTG secondary to ELOS  Skilled Therapeutic Interventions/Progress Updates:    Pt asleep upon arrival but aroused with min verbal cues.  Focus on bed mobility, sit<>stand, standing balance, and BADL retraining including dressing with sit<>stand from EOB.  Pt declined "washing up" this morning.  Pt completed all dressing tasks at supervision level.  Pt transferred to w/c and completed grooming tasks seated in w/c at sink.  Pt requires more than a reasonable amount of time to complete tasks.  Continued discharge planning and safety education. Pt remained in w/c with all needs within reach.   Therapy Documentation Precautions:  Precautions Precautions: Fall Restrictions Weight Bearing Restrictions: Yes LLE Weight Bearing: Non weight bearing Other Position/Activity Restrictions: Darco shoe for left foot with weightbearing.    Pain:  Pt c/o "a little" L foot phantom pain; MD aware  See Function Navigator for Current Functional Status.   Therapy/Group: Individual Therapy  Rich BraveLanier, [place] Bearse Chappell 12/02/2016, 8:00 AM

## 2016-12-02 NOTE — Patient Care Conference (Signed)
Inpatient RehabilitationTeam Conference and Plan of Care Update Date: 12/01/2016   Time: 2:10 PM    Patient Name: Andrea [name]      Medical Record Number: [id number]  Date of Birth: 1950-05-02 Sex: Female         Room/Bed: 4W09C/4W09C-01 Payor Info: Payor: BLUE CROSS BLUE SHIELD / Plan: BCBS OTHER / Product Type: *No Product type* /    Admitting Diagnosis: Transmet   Admit Date/Time:  11/21/2016  3:37 PM Admission Comments: No comment available   Primary Diagnosis:  <principal problem not specified> Principal Problem: <principal problem not specified>  Patient Active Problem List   Diagnosis Date Noted  . Post-operative pain   . Type 2 diabetes mellitus with peripheral neuropathy (HCC)   . AKI (acute kidney injury) (HCC)   . Status post transmetatarsal amputation of foot, left (HCC) 11/21/2016  . S/P transmetatarsal amputation of foot, left (HCC)   . Acute blood loss anemia   . Diabetes mellitus type 2 in nonobese (HCC)   . Benign essential HTN   . Fibromyalgia   . Postoperative pain   . 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 Atherosclerosis of native arteries of extremities with gangrene, left leg (HCC) 07/20/2016  . GERD (gastroesophageal reflux disease) 07/01/2016  . Adjustment reaction with anxiety and depression 07/01/2016  . Wound infection 06/12/2016  . CAD (coronary artery disease) of artery bypass graft 04/15/2016  . Coronary artery disease involving coronary bypass graft of native heart with angina pectoris (HCC)   . Coronary artery disease involving native coronary artery of native heart without angina pectoris 04/07/2016  . PAD (peripheral artery disease) (HCC) 03/30/2016  . Diabetic foot ulcers (HCC) 03/30/2016  . Hyperlipidemia 03/30/2016  . Diabetic neuropathy (HCC) 02/24/2016  . Tobacco abuse 02/24/2016  . Overactive bladder 01/14/2016  . Diabetes mellitus without complication (HCC) 10/18/2006  . Essential hypertension 10/18/2006  . LOW  BACK PAIN, CHRONIC 10/18/2006    Expected Discharge Date: Expected Discharge Date: 12/05/16  Team Members Present: Physician leading conference: Dr. Faith RogueZachary Swartz Social Worker Present: Amada JupiterLucy Caylan Schifano, LCSW Nurse Present: Carlean PurlMaryann Barbour, RN PT Present: Alyson ReedyElizabeth Tygielski, PT OT Present: Ardis Rowanom Lanier, COTA;Jennifer Katrinka BlazingSmith, OT PPS Coordinator present : Tora DuckMarie Noel, RN, CRRN     Current Status/Progress Goal Weekly Team Focus  Medical   Patient's wound continues to heal nicely.  Sugar control has been better for the most part.  Blood pressures have stabilized  Pain control and wound care optimization  Continue to adjust regimen for blood sugars and blood pressure as appropriate   Bowel/Bladder   continent of b/b with incontinent episodes noted  maintain skin integrity with changing continence ability      Swallow/Nutrition/ Hydration   diet is carb modified and thin liquids  maintain adequate weight      ADL's   assist x 1 with adl care  improve independence in adl care within the confines of disease process  education, discharge planning, functional amb with SPC, activity tolerance, IADLs   Mobility   min guard with SPC  mod I transfers and household gait; supervision stairs  dynamic standing balance, gait in home enviornment, activity tolerance   Communication   verbal communication  maintain verbal communication      Safety/Cognition/ Behavioral Observations  utilize callbell for staff assistance  maintain a decreased risk  of injury      Pain   denies any pain at this time  maintain comfort level      Skin  left foot amputation with sutures intact and approximated and dry wrapped per order            *See Care Plan and progress notes for long and short-term goals.     Barriers to Discharge  Current Status/Progress Possible Resolutions Date Resolved   Physician    Wound Care;Medical stability         optimize glucose control once home      Nursing                  PT                     OT                  SLP                SW                Discharge Planning/Teaching Needs:  Pt plans to return to her own apartment (note it is 2 level and she sleeps on couch and has access to 1/2 bathroom downstairs)  Teaching needs TBD   Team Discussion:  Making good progress with therapies and on track to meet mod ind goals.  MD aware still with some blood-tinged drainage at surgical site.  Will have HHRN monitor.  Revisions to Treatment Plan:  None    Continued Need for Acute Rehabilitation Level of Care: The patient requires daily medical management by a physician with specialized training in physical medicine and rehabilitation for the following conditions: Daily direction of a multidisciplinary physical rehabilitation program to ensure safe treatment while eliciting the highest outcome that is of practical value to the patient.: Yes Daily medical management of patient stability for increased activity during participation in an intensive rehabilitation regime.: Yes Daily analysis of laboratory values and/or radiology reports with any subsequent need for medication adjustment of medical intervention for : Diabetes problems;Wound care problems;Blood pressure problems  Branae Crail 12/02/2016, 4:30 PM

## 2016-12-02 NOTE — Progress Notes (Addendum)
Physical Therapy Session Note  Patient Details  Name: Andrea [name] MRN: [id number] Date of Birth: 01/04/1951  Today's Date: 12/02/2016 PT Individual Time: [id number] and 1445-1530 PT Individual Time Calculation (min): 60 min and 45 min (total 105 min)   Short Term Goals: Week 2:  PT Short Term Goal 1 (Week 2): =LTG due to estimated LOS  Skilled Therapeutic Interventions/Progress Updates: Tx 1: Pt received seated in w/c, denies pain and agreeable to treatment. Stand pivot transfer w/c <>nustep with no AD and modI. Performed BLE/BUE nustep x10 min total on level 4 with average 45 steps/min for strengthening and aerobic endurance. Stand pivot transfer w/c <>mat table modI. Standing balance on airex foam pad while engaged in dynamic BUE for focus on ankle strategy and cognitive dual task; S overall and increased time required for problem solving puzzles. Gait with SPC and S x150' until fatigued and pt requested seated rest break due to fatigue. Returned to room remaining distance in w/c totalA for energy conservation; remained seated in w/c at end of session, all needs in reach.   Tx 2: Pt received asleep in bed, easily awoken but c/o fatigue and agreeable to bed level activity with encouragement. Provided pt with handout of LE strengthening exercises including glute sets, straight leg raise, bridging, sidelying clamshell hip abduction, long arc quad, hip flexion marching. Performed each exercise x15 reps each with minimal cueing for technique. Stand pivot transfer to w/c with modI to perform seated exercises. Encouraged performance of LE exercises on days when she has less therapy, or later in the afternoon, as well as continued performance after d/c home. Remained seated in w/c at end of session, all needs in reach.      Therapy Documentation Precautions:  Precautions Precautions: Fall Restrictions Weight Bearing Restrictions: Yes LLE Weight Bearing: Non weight bearing Other  Position/Activity Restrictions: Darco shoe for left foot with weightbearing.   See Function Navigator for Current Functional Status.   Therapy/Group: Individual Therapy  Vista Lawmanlizabeth J Tygielski 12/02/2016, 11:54 AM

## 2016-12-02 NOTE — Progress Notes (Signed)
Social Work Patient ID: Andrea [name], female   DOB: [birth year], 66 y.o.   MRN: [id number]   Met with pt today to review team conference. She reports she is feeling ready for d/c on Sat and agreeable with HH follow up.  Requests [facility] as they have followed her in the past. Will arrange.  Mickey Esguerra, LCSW

## 2016-12-02 NOTE — Progress Notes (Signed)
Physical Therapy Session Note  Patient Details  Name: Andrea [name] MRN: [id number] Date of Birth: 04/07/1950  Today's Date: 12/02/2016 PT Individual Time: 1037-1105 PT Individual Time Calculation (min): 28 min     Skilled Therapeutic Interventions/Progress Updates:    Pt reports 5/10 pain, however, declines needing medications.  Initial vitals:  100/49.  HR: 52 bpm.  Session focused on improving endurance and promoting improved safety with functional mobility for hopeful D/C to home environment.  Pt propelled to gym in w/c with B UE and back to room in same fashion.  Pt ambulated 2 x 50 ft with SPC and darco shoe.  Pt used darco shoe for all weight bearing exercise.  Pt performed static standing activity of folding washclothes and perfoming puzzle to increase standing tolerance to improve ability to perform standing functional tasks upon D/C.  Following session, pt returned to room with nursing present changing linens.  Pt left seated in w/c while nursing changing bed linens.  Therapy Documentation Precautions:  Precautions Precautions: Fall Restrictions Weight Bearing Restrictions: Yes LLE Weight Bearing: Non weight bearing Other Position/Activity Restrictions: Darco shoe for left foot with weightbearing.     See Function Navigator for Current Functional Status.   Therapy/Group: Individual Therapy  [name] [name] 12/02/2016, 11:47 AM

## 2016-12-03 LAB — GLUCOSE, CAPILLARY
GLUCOSE-CAPILLARY: 110 mg/dL — AB (ref 65–99)
Glucose-Capillary: 126 mg/dL — ABNORMAL HIGH (ref 65–99)
Glucose-Capillary: 164 mg/dL — ABNORMAL HIGH (ref 65–99)
Glucose-Capillary: 148 mg/dL — ABNORMAL HIGH (ref 65–99)

## 2016-12-03 NOTE — Progress Notes (Signed)
Pt is noted to have some bleeding from the abdomen from the heparin injections. Refused CBG this morning and refused am drsg change to left foot. Provider in at bedside and notified. Will continue to monitor for additional signs / symptoms of bleeding.

## 2016-12-03 NOTE — Progress Notes (Signed)
Subjective/Complaints: No major issues overnight.  She did refuse dressing change this morning and refused CBG  ROS: pt denies nausea, vomiting, diarrhea, cough, shortness of breath or chest pain   Objective: Vital Signs: Blood pressure (!) 109/34, pulse (!) 56, temperature 98 F (36.7 C), temperature source Oral, resp. rate 16, height _0  (1.6 m), weight 55.2 kg (121 lb 9.6 oz), SpO2 99 %. No results found. Results for orders placed or performed during the hospital encounter of 11/21/16 (from the past 72 hour(s))  Glucose, capillary     Status: Abnormal   Collection Time: 11/30/16 11:48 AM  Result Value Ref Range   Glucose-Capillary 102 (H) 65 - 99 mg/dL  Glucose, capillary     Status: Abnormal   Collection Time: 11/30/16  4:56 PM  Result Value Ref Range   Glucose-Capillary 103 (H) 65 - 99 mg/dL  Glucose, capillary     Status: Abnormal   Collection Time: 11/30/16  8:33 PM  Result Value Ref Range   Glucose-Capillary 122 (H) 65 - 99 mg/dL  Basic metabolic panel     Status: Abnormal   Collection Time: 12/01/16  5:31 AM  Result Value Ref Range   Sodium 142 135 - 145 mmol/L   Potassium 3.7 3.5 - 5.1 mmol/L   Chloride 109 101 - 111 mmol/L   CO2 24 22 - 32 mmol/L   Glucose, Bld 113 (H) 65 - 99 mg/dL   BUN 28 (H) 6 - 20 mg/dL   Creatinine, Ser 0.90 0.44 - 1.00 mg/dL   Calcium 8.7 (L) 8.9 - 10.3 mg/dL   GFR calc non Af Amer >60 >60 mL/min   GFR calc Af Amer >60 >60 mL/min    Comment: (NOTE) The eGFR has been calculated using the CKD EPI equation. This calculation has not been validated in all clinical situations. eGFR's persistently <60 mL/min signify possible Chronic Kidney Disease.    Anion gap 9 5 - 15  Glucose, capillary     Status: Abnormal   Collection Time: 12/01/16  6:33 AM  Result Value Ref Range   Glucose-Capillary 109 (H) 65 - 99 mg/dL   Comment 1 Notify RN   Glucose, capillary     Status: Abnormal   Collection Time: 12/01/16 11:46 AM  Result Value Ref Range    Glucose-Capillary 140 (H) 65 - 99 mg/dL  Glucose, capillary     Status: Abnormal   Collection Time: 12/01/16  4:39 PM  Result Value Ref Range   Glucose-Capillary 137 (H) 65 - 99 mg/dL  Glucose, capillary     Status: Abnormal   Collection Time: 12/01/16  8:19 PM  Result Value Ref Range   Glucose-Capillary 234 (H) 65 - 99 mg/dL  Glucose, capillary     Status: None   Collection Time: 12/02/16  6:58 AM  Result Value Ref Range   Glucose-Capillary 98 65 - 99 mg/dL  Glucose, capillary     Status: Abnormal   Collection Time: 12/02/16 12:17 PM  Result Value Ref Range   Glucose-Capillary 121 (H) 65 - 99 mg/dL  Glucose, capillary     Status: Abnormal   Collection Time: 12/02/16  4:42 PM  Result Value Ref Range   Glucose-Capillary 127 (H) 65 - 99 mg/dL  Glucose, capillary     Status: Abnormal   Collection Time: 12/02/16  8:30 PM  Result Value Ref Range   Glucose-Capillary 110 (H) 65 - 99 mg/dL     HEENT: Normocephalic, atraumatic. Cardio: RRR without murmur. No JVD  Resp: CTA Bilaterally without wheezes or rales. Normal effort    GI: BS positive and nondistended Skin:    Left TMA incision clean and intact, drainage without Neuro: Alert/Oriented Motor 5/5 bilateral deltoids, biceps, triceps, grip  4/5 in the right hip flexion, knee extensor,   3/5 ankle dorsi/plantar flexors  4-/5 left hip flexion, knee extension--no changes Musc/Skel: Left foot tenderness to palpation Gen. NAD   Assessment/Plan: 1. Functional deficits secondary to left TMA, nonweightbearing which require 3+ hours per day of interdisciplinary therapy in a comprehensive inpatient rehab setting. Physiatrist is providing close team supervision and 24 hour management of active medical problems listed below. Physiatrist and rehab team continue to assess barriers to discharge/monitor patient progress toward functional and medical goals. FIM: Function - Bathing Position: Wheelchair/chair at sink Body parts bathed by  patient: Right arm, Left arm, Chest, Right upper leg, Left upper leg, Right lower leg, Left lower leg, Abdomen, Front perineal area, Buttocks Body parts bathed by helper: Back Assist Level: Supervision or verbal cues  Function- Upper Body Dressing/Undressing What is the patient wearing?: Pull over shirt/dress, Bra Bra - Perfomed by patient: Thread/unthread left bra strap, Thread/unthread right bra strap, Hook/unhook bra (pull down sports bra) Bra - Perfomed by helper: Hook/unhook bra (pull down sports bra) Pull over shirt/dress - Perfomed by patient: Thread/unthread right sleeve, Thread/unthread left sleeve, Put head through opening, Pull shirt over trunk Assist Level: Supervision or verbal cues Set up : To obtain clothing/put away Function - Lower Body Dressing/Undressing What is the patient wearing?: Non-skid slipper socks Position: Sitting EOB Underwear - Performed by patient: Thread/unthread right underwear leg, Thread/unthread left underwear leg, Pull underwear up/down Pants- Performed by patient: Thread/unthread right pants leg, Thread/unthread left pants leg, Pull pants up/down, Fasten/unfasten pants Non-skid slipper socks- Performed by patient: Don/doff right sock Shoes - Performed by patient: Don/doff left shoe, Don/doff right shoe, Fasten left Shoes - Performed by helper: Don/doff left shoe Assist for footwear: Supervision/touching assist Assist for lower body dressing: Supervision or verbal cues, Set up  Function - Toileting Toileting steps completed by patient: Adjust clothing prior to toileting, Performs perineal hygiene, Adjust clothing after toileting Toileting Assistive Devices: Grab bar or rail Assist level: Supervision or verbal cues  Function - Air cabin crew transfer assistive device: Grab bar Assist level to toilet: No Help, no cues, assistive device, takes more than a reasonable amount of time Assist level from toilet: No Help, no cues, assistive device,  takes more than a reasonable amount of time  Function - Chair/bed transfer Chair/bed transfer method: Ambulatory Chair/bed transfer assist level: Touching or steadying assistance (Pt > 75%) Chair/bed transfer assistive device: Bedrails, Armrests Chair/bed transfer details: Verbal cues for precautions/safety  Function - Locomotion: Wheelchair Will patient use wheelchair at discharge?: Yes Type: Manual Max wheelchair distance: (80 ft) Assist Level: Supervision or verbal cues Assist Level: Supervision or verbal cues Assist Level: Supervision or verbal cues Turns around,maneuvers to table,bed, and toilet,negotiates 3% grade,maneuvers on rugs and over doorsills: No Function - Locomotion: Ambulation Assistive device: Cane-straight Max distance: 150' Assist level: Supervision or verbal cues Assist level: Supervision or verbal cues Walk 50 feet with 2 turns activity did not occur: Safety/medical concerns Assist level: Supervision or verbal cues Walk 150 feet activity did not occur: Safety/medical concerns Assist level: Supervision or verbal cues Walk 10 feet on uneven surfaces activity did not occur: Safety/medical concerns Assist level: Touching or steadying assistance (Pt > 75%)  Function - Comprehension Comprehension: Auditory Comprehension assist level:  Follows complex conversation/direction with extra time/assistive device  Function - Expression Expression: Verbal Expression assist level: Expresses complex ideas: With extra time/assistive device  Function - Social Interaction Social Interaction assist level: Interacts appropriately with others - No medications needed.  Function - Problem Solving Problem solving assist level: Solves complex 90% of the time/cues < 10% of the time  Function - Memory Memory assist level: Recognizes or recalls 90% of the time/requires cueing < 10% of the time Patient normally able to recall (first 3 days only): Current season, Staff names and  faces, That he or she is in a hospital, Location of own room  Medical Problem List and Plan: 1.Decreased functional mobilitysecondary to left transmetatarsal amputation 11/19/2016 after recent left first and second toe amputation with gangrenous changes  -Cont CIR.  2. DVT Prophylaxis/Anticoagulation: Subcutaneous heparin. Monitor for any bleeding episodes 3. Pain Management:Cymbalta 60 mg daily,   hydrocodone as needed  -having phantom limb pain.  Increased Lyrica to 188m TID  4. Mood:Provide emotional support 5. Neuropsych: This patientiscapable of making decisions on herown behalf. 6. Skin/Wound Care:Routine skin checks.  Can hold off on dressing change today 7. Fluids/Electrolytes/Nutrition:  -BUN and creatinine showing some improvement    -Push p.o. Fluids.     -Continue to hold Lasix,  Holding HCTZ as well  -recheck labs friday 8.Acute blood loss anemia.      hemoglobin 8.1 on 11/26/2016.     -Recheck Friday  9.Diabetes mellitus peripheral neuropathy hemoglobin A1c 7.3. Lantus insulin 30 units daily at bedtime, Glucophage 1000 mg twice a day.Diabetic teaching Reduced Lantus to 25 units nightly   improved control  -Reviewed the importance of glucose control with patient 10.Hypertension.(HCTZ 25 mg daily, Lasix 10 mg daily), lisinopril 5 mg daily, Lopressor 12.5 mg twice a day Vitals:   12/02/16 2017 12/03/16 0529  BP:  (!) 109/34  Pulse:  (!) 56  Resp:  16  Temp:  98 F (36.7 C)  SpO2: 96% 99%    -Lasix held due to elevation of her BUN and creatinine.  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 Continue aspirin and Plavix. No chest pain or shortness of breath 12.Hyperlipidemia. Lipitor 13.Tobacco abuse. Patient recently stopped smoking 7 months ago. Provide counseling 14. AKI   Cr 0.9    LOS (Days) 12 A FACE TO FACE EVALUATION WAS PERFORMED  SWARTZ,ZACHARY T 12/03/2016, 8:43 AM

## 2016-12-03 NOTE — Progress Notes (Signed)
Pt resting in bed quietly. Easily aroused. Denies any pain at this time. Educated that lyrica was increased to TID at 100 mg. Noncompliant with weight bearing status to left leg. Able to make needs known. callebll within reach. Safety maintained. Will continue to monitor.

## 2016-12-04 ENCOUNTER — Inpatient Hospital Stay (HOSPITAL_COMMUNITY): Payer: BLUE CROSS/BLUE SHIELD

## 2016-12-04 ENCOUNTER — Inpatient Hospital Stay (HOSPITAL_COMMUNITY): Payer: BLUE CROSS/BLUE SHIELD | Admitting: Occupational Therapy

## 2016-12-04 ENCOUNTER — Inpatient Hospital Stay (HOSPITAL_COMMUNITY): Payer: BLUE CROSS/BLUE SHIELD | Admitting: Physical Therapy

## 2016-12-04 LAB — CBC
HEMATOCRIT: 27.3 % — AB (ref 36.0–46.0)
Hemoglobin: 8.2 g/dL — ABNORMAL LOW (ref 12.0–15.0)
MCH: 27.8 pg (ref 26.0–34.0)
MCHC: 30 g/dL (ref 30.0–36.0)
MCV: 92.5 fL (ref 78.0–100.0)
Platelets: 316 10*3/uL (ref 150–400)
RBC: 2.95 MIL/uL — ABNORMAL LOW (ref 3.87–5.11)
RDW: 15.6 % — AB (ref 11.5–15.5)
WBC: 7.1 10*3/uL (ref 4.0–10.5)

## 2016-12-04 LAB — BASIC METABOLIC PANEL
Anion gap: 7 (ref 5–15)
BUN: 19 mg/dL (ref 6–20)
CO2: 26 mmol/L (ref 22–32)
Calcium: 8.4 mg/dL — ABNORMAL LOW (ref 8.9–10.3)
Chloride: 109 mmol/L (ref 101–111)
Creatinine, Ser: 0.81 mg/dL (ref 0.44–1.00)
GFR calc Af Amer: >60 mL/min (ref >60)
GLUCOSE: 91 mg/dL (ref 65–99)
POTASSIUM: 3.7 mmol/L (ref 3.5–5.1)
Sodium: 142 mmol/L (ref 135–145)

## 2016-12-04 LAB — GLUCOSE, CAPILLARY
GLUCOSE-CAPILLARY: 118 mg/dL — AB (ref 65–99)
Glucose-Capillary: 84 mg/dL (ref 65–99)
Glucose-Capillary: 121 mg/dL — ABNORMAL HIGH (ref 65–99)
Glucose-Capillary: 170 mg/dL — ABNORMAL HIGH (ref 65–99)

## 2016-12-04 MED ORDER — DOCUSATE SODIUM 100 MG PO CAPS: 100.0000 mg | ORAL_CAPSULE | Freq: Every day | ORAL | 0 refills | Status: DC

## 2016-12-04 MED ORDER — OXYBUTYNIN CHLORIDE 5 MG PO TABS: 5.0000 mg | ORAL_TABLET | Freq: Two times a day (BID) | ORAL | 0 refills | Status: DC

## 2016-12-04 MED ORDER — METOPROLOL TARTRATE 25 MG PO TABS: 12.5000 mg | ORAL_TABLET | Freq: Two times a day (BID) | ORAL | 0 refills | Status: DC

## 2016-12-04 MED ORDER — LISINOPRIL 5 MG PO TABS: 5.0000 mg | ORAL_TABLET | Freq: Every day | ORAL | 0 refills | Status: DC

## 2016-12-04 MED ORDER — ACETAMINOPHEN 325 MG PO TABS: 650.0000 mg | ORAL_TABLET | Freq: Four times a day (QID) | ORAL | Status: AC | PRN

## 2016-12-04 MED ORDER — METFORMIN HCL 1000 MG PO TABS: 500.0000 mg | ORAL_TABLET | Freq: Two times a day (BID) | ORAL | 0 refills | Status: DC

## 2016-12-04 MED ORDER — INSULIN GLARGINE 100 UNIT/ML SOLOSTAR PEN: 25.0000 [IU] | PEN_INJECTOR | Freq: Every day | SUBCUTANEOUS | 5 refills | Status: DC

## 2016-12-04 MED ORDER — PREGABALIN 100 MG PO CAPS: 100.0000 mg | ORAL_CAPSULE | Freq: Three times a day (TID) | ORAL | 0 refills | Status: DC

## 2016-12-04 MED ORDER — DULOXETINE HCL 60 MG PO CPEP: 60.0000 mg | ORAL_CAPSULE | Freq: Every day | ORAL | 0 refills | Status: DC

## 2016-12-04 MED ORDER — PANTOPRAZOLE SODIUM 20 MG PO TBEC: 20.0000 mg | DELAYED_RELEASE_TABLET | Freq: Every day | ORAL | 1 refills | Status: DC

## 2016-12-04 MED ORDER — HYDROCODONE-ACETAMINOPHEN 5-325 MG PO TABS: 1.0000 | ORAL_TABLET | Freq: Four times a day (QID) | ORAL | 0 refills | Status: DC | PRN

## 2016-12-04 NOTE — Progress Notes (Signed)
Pt resting in bed quietly. Easily aroused. Non-compliant with weight bearing orders. Ambulatory with slow gait. Continent with incontinent episodes noted. Educated on need to monitor and report any noted bleeding to nursing especially if she notes bleeding while brushing teeth or toileting specifically since heparin is the vte plan. Denies any pain at this time. Safety maintained. Callbell within reach. Will continue to monitor.

## 2016-12-04 NOTE — Progress Notes (Signed)
Social Work  Discharge Note  The overall goal for the admission was met for:   Discharge location: Yes - returning to her own apartment alone  Length of Stay: Yes - 14 days (with discharge 11/24)  Discharge activity level: Yes - modified independent  Home/community participation: Yes  Services provided included: MD, RD, PT, OT, RN, TR, Pharmacy, Neuropsych and SW  Financial Services: Private Insurance: [employer]  Follow-up services arranged: Home Health: RN, PT via [place] and Patient/Family has no preference for HH/DME agencies  Comments (or additional information):  Patient/Family verbalized understanding of follow-up arrangements: Yes  Individual responsible for coordination of the follow-up plan: pt  Confirmed correct DME delivered: NA - had all needed DME    Andrea [name]

## 2016-12-04 NOTE — Progress Notes (Signed)
Subjective/Complaints: Up walking with therapy.  Actually walking back from the gym to her room.  Doing well.  Pain seems controlled and she is sleeping  ROS: pt denies nausea, vomiting, diarrhea, cough, shortness of breath or chest pain  Objective: Vital Signs: Blood pressure 126/66, pulse 80, temperature 98.9 F (37.2 C), temperature source Oral, resp. rate 18, height 5' 3"  (1.6 m), weight 55.2 kg (121 lb 9.6 oz), SpO2 99 %. No results found. Results for orders placed or performed during the hospital encounter of 11/21/16 (from the past 72 hour(s))  Glucose, capillary     Status: Abnormal   Collection Time: 12/01/16 11:46 AM  Result Value Ref Range   Glucose-Capillary 140 (H) 65 - 99 mg/dL  Glucose, capillary     Status: Abnormal   Collection Time: 12/01/16  4:39 PM  Result Value Ref Range   Glucose-Capillary 137 (H) 65 - 99 mg/dL  Glucose, capillary     Status: Abnormal   Collection Time: 12/01/16  8:19 PM  Result Value Ref Range   Glucose-Capillary 234 (H) 65 - 99 mg/dL  Glucose, capillary     Status: None   Collection Time: 12/02/16  6:58 AM  Result Value Ref Range   Glucose-Capillary 98 65 - 99 mg/dL  Glucose, capillary     Status: Abnormal   Collection Time: 12/02/16 12:17 PM  Result Value Ref Range   Glucose-Capillary 121 (H) 65 - 99 mg/dL  Glucose, capillary     Status: Abnormal   Collection Time: 12/02/16  4:42 PM  Result Value Ref Range   Glucose-Capillary 127 (H) 65 - 99 mg/dL  Glucose, capillary     Status: Abnormal   Collection Time: 12/02/16  8:30 PM  Result Value Ref Range   Glucose-Capillary 110 (H) 65 - 99 mg/dL  Glucose, capillary     Status: Abnormal   Collection Time: 12/03/16  9:58 AM  Result Value Ref Range   Glucose-Capillary 110 (H) 65 - 99 mg/dL  Glucose, capillary     Status: Abnormal   Collection Time: 12/03/16 12:00 PM  Result Value Ref Range   Glucose-Capillary 126 (H) 65 - 99 mg/dL  Glucose, capillary     Status: Abnormal   Collection  Time: 12/03/16  5:05 PM  Result Value Ref Range   Glucose-Capillary 164 (H) 65 - 99 mg/dL  Glucose, capillary     Status: Abnormal   Collection Time: 12/03/16  9:30 PM  Result Value Ref Range   Glucose-Capillary 148 (H) 65 - 99 mg/dL  Glucose, capillary     Status: None   Collection Time: 12/04/16  6:38 AM  Result Value Ref Range   Glucose-Capillary 84 65 - 99 mg/dL  Basic metabolic panel     Status: Abnormal   Collection Time: 12/04/16  7:45 AM  Result Value Ref Range   Sodium 142 135 - 145 mmol/L   Potassium 3.7 3.5 - 5.1 mmol/L   Chloride 109 101 - 111 mmol/L   CO2 26 22 - 32 mmol/L   Glucose, Bld 91 65 - 99 mg/dL   BUN 19 6 - 20 mg/dL   Creatinine, Ser 0.81 0.44 - 1.00 mg/dL   Calcium 8.4 (L) 8.9 - 10.3 mg/dL   GFR calc non Af Amer >60 >60 mL/min   GFR calc Af Amer >60 >60 mL/min    Comment: (NOTE) The eGFR has been calculated using the CKD EPI equation. This calculation has not been validated in all clinical situations. eGFR's persistently <60  mL/min signify possible Chronic Kidney Disease.    Anion gap 7 5 - 15  CBC     Status: Abnormal   Collection Time: 12/04/16  7:45 AM  Result Value Ref Range   WBC 7.1 4.0 - 10.5 K/uL   RBC 2.95 (L) 3.87 - 5.11 MIL/uL   Hemoglobin 8.2 (L) 12.0 - 15.0 g/dL   HCT 27.3 (L) 36.0 - 46.0 %   MCV 92.5 78.0 - 100.0 fL   MCH 27.8 26.0 - 34.0 pg   MCHC 30.0 30.0 - 36.0 g/dL   RDW 15.6 (H) 11.5 - 15.5 %   Platelets 316 150 - 400 K/uL     HEENT: Normocephalic, atraumatic. Cardio: RRR without murmur. No JVD  Resp: CTA Bilaterally without wheezes or rales. Normal effort    GI: BS positive and nondistended Skin:    Left TMA incision clean and intact, drainage without Neuro: Alert/Oriented Motor 5/5 bilateral deltoids, biceps, triceps, grip  4/5 in the right hip flexion, knee extensor,   3/5 ankle dorsi/plantar flexors  4-/5 left hip flexion, knee extension--no changes Musc/Skel: Left foot tenderness to palpation Gen. NAD    Assessment/Plan: 1. Functional deficits secondary to left TMA, nonweightbearing which require 3+ hours per day of interdisciplinary therapy in a comprehensive inpatient rehab setting. Physiatrist is providing close team supervision and 24 hour management of active medical problems listed below. Physiatrist and rehab team continue to assess barriers to discharge/monitor patient progress toward functional and medical goals. FIM: Function - Bathing Position: Wheelchair/chair at sink Body parts bathed by patient: Right arm, Left arm, Chest, Right upper leg, Left upper leg, Right lower leg, Left lower leg, Abdomen, Front perineal area, Buttocks Body parts bathed by helper: Back Assist Level: More than reasonable time  Function- Upper Body Dressing/Undressing What is the patient wearing?: Pull over shirt/dress, Bra Bra - Perfomed by patient: Thread/unthread left bra strap, Thread/unthread right bra strap, Hook/unhook bra (pull down sports bra) Bra - Perfomed by helper: Hook/unhook bra (pull down sports bra) Pull over shirt/dress - Perfomed by patient: Thread/unthread right sleeve, Thread/unthread left sleeve, Put head through opening, Pull shirt over trunk Assist Level: More than reasonable time Set up : To obtain clothing/put away Function - Lower Body Dressing/Undressing What is the patient wearing?: Underwear, Pants, Non-skid slipper socks, Shoes Position: Wheelchair/chair at sink Underwear - Performed by patient: Thread/unthread right underwear leg, Thread/unthread left underwear leg, Pull underwear up/down Pants- Performed by patient: Thread/unthread right pants leg, Thread/unthread left pants leg, Pull pants up/down, Fasten/unfasten pants Non-skid slipper socks- Performed by patient: Don/doff right sock Shoes - Performed by patient: Don/doff left shoe, Don/doff right shoe, Fasten left Shoes - Performed by helper: Don/doff left shoe Assist for footwear: Supervision/touching  assist Assist for lower body dressing: More than reasonable time  Function - Toileting Toileting steps completed by patient: Adjust clothing prior to toileting, Performs perineal hygiene, Adjust clothing after toileting Toileting Assistive Devices: Grab bar or rail Assist level: More than reasonable time  Function - Air cabin crew transfer assistive device: Grab bar, Cane Assist level to toilet: No Help, no cues, assistive device, takes more than a reasonable amount of time Assist level from toilet: No Help, no cues, assistive device, takes more than a reasonable amount of time  Function - Chair/bed transfer Chair/bed transfer method: Ambulatory Chair/bed transfer assist level: No Help, no cues, assistive device, takes more than a reasonable amount of time Chair/bed transfer assistive device: Armrests Chair/bed transfer details: Verbal cues for precautions/safety  Function - Locomotion: Wheelchair Will patient use wheelchair at discharge?: Yes Type: Manual Max wheelchair distance: 150 Assist Level: No help, No cues, assistive device, takes more than reasonable amount of time Assist Level: No help, No cues, assistive device, takes more than reasonable amount of time Assist Level: No help, No cues, assistive device, takes more than reasonable amount of time Turns around,maneuvers to table,bed, and toilet,negotiates 3% grade,maneuvers on rugs and over doorsills: No Function - Locomotion: Ambulation Assistive device: Cane-straight Max distance: 150' Assist level: No help, No cues, assistive device, takes more than a reasonable amount of time Assist level: No help, No cues, assistive device, takes more than a reasonable amount of time Walk 50 feet with 2 turns activity did not occur: Safety/medical concerns Assist level: No help, No cues, assistive device, takes more than a reasonable amount of time Walk 150 feet activity did not occur: Safety/medical concerns Assist level: No  help, No cues, assistive device, takes more than a reasonable amount of time Walk 10 feet on uneven surfaces activity did not occur: Safety/medical concerns Assist level: Supervision or verbal cues  Function - Comprehension Comprehension: Auditory Comprehension assist level: Follows complex conversation/direction with extra time/assistive device  Function - Expression Expression: Verbal Expression assist level: Expresses complex ideas: With extra time/assistive device  Function - Social Interaction Social Interaction assist level: Interacts appropriately with others - No medications needed.  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   -ELOS 11/24 2. DVT Prophylaxis/Anticoagulation: Subcutaneous heparin. Monitor for any bleeding episodes 3. Pain Management:Cymbalta 60 mg daily,   hydrocodone as needed  -having phantom limb pain.  Increased Lyrica to 163m TID with good results  4. Mood:Provide emotional support 5. Neuropsych: This patientiscapable of making decisions on herown behalf. 6. Skin/Wound Care:Routine skin checks.  Can hold off on dressing change today 7. Fluids/Electrolytes/Nutrition:  -BUN and creatinine are now normal.  -Continue to hold Lasix and hydrochlorothiazide  8.Acute blood loss anemia.      hemoglobin 8.2 today.       9.Diabetes mellitus peripheral neuropathy hemoglobin A1c 7.3. Lantus insulin 30 units daily at bedtime,  Glucophage 1000 mg twice a day.Diabetic teaching Reduced Lantus to 25 units nightly   improved control  -Reviewed the importance of glucose control with patient 10.Hypertension.(HCTZ 25 mg daily, Lasix 10 mg daily), lisinopril 5 mg daily, Lopressor 12.5 mg twice a day Vitals:   12/03/16 1925 12/04/16 0518  BP:  126/66  Pulse:  80  Resp:  18  Temp:  98.9 F (37.2 C)  SpO2: 99% 99%    -Blood pressures under control.  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 Continue aspirin and Plavix. No chest pain or shortness of breath 12.Hyperlipidemia. Lipitor 13.Tobacco abuse. Patient recently stopped smoking 7 months ago. Provide counseling 14. AKI   Cr 0.9    LOS (Days) 13 A FACE TO FACE EVALUATION WAS PERFORMED  Kloe Oates T 12/04/2016, 10:15 AM

## 2016-12-04 NOTE — Progress Notes (Signed)
Occupational Therapy Session Note  Patient Details  Name: Andrea [name] MRN: [id number] Date of Birth: [birth year]  Today's Date: 12/04/2016 OT Individual Time: 0700-0755 OT Individual Time Calculation (min): 55 min    Short Term Goals: Week 2:  OT Short Term Goal 1 (Week 2): STG=LTG secondary to ELOS  Skilled Therapeutic Interventions/Progress Updates:   OT intervention with focus on BADLs, standing balance, and functional amb with SPC.  Pt completes all tasks without assistance at mod I level.  Pt states she will sponge bathe at home and can stand at sink.  Pt is able to don her Darco shoe without assistance.  Pt pleased with progress and ready for discharge home tomorrow.   Therapy Documentation Precautions:  Precautions Precautions: Fall Restrictions Weight Bearing Restrictions: Yes LLE Weight Bearing: Non weight bearing Other Position/Activity Restrictions: Darco shoe for left foot with weightbearing. Pain:  Pt denies pain  See Function Navigator for Current Functional Status.   Therapy/Group: Individual Therapy  Rich BraveLanier, Annmarie Plemmons Chappell 12/04/2016, 8:01 AM

## 2016-12-04 NOTE — Progress Notes (Signed)
Occupational Therapy Note  Patient Details  Name: Andrea [name] MRN: [id number] Date of Birth: [birth year]  Today's Date: 12/04/2016 OT Individual Time: 1300-1326 OT Individual Time Calculation (min): 26 min   Pt denies pain Individual Therapy  Focus on discharge planning, functional amb with SPC for simple home mgmt tasks, toilet transfers and toileting.  Pt mod I for all simple tasks.  Pt made mod I in room. Pt pleased with progress and ready for d/c tomorrow.    Lavone NeriLanier, Jamey Harman [facility] Vernon[facility]appell 12/04/2016, 1:30 PM

## 2016-12-04 NOTE — Discharge Instructions (Signed)
Inpatient Rehab Discharge Instructions  [facility]    Activities/Precautions/ Functional Status: Activity: activity as tolerated Diet: diabetic diet Wound Care:  Wash with soap and water. Pat dry and change dressing daily. Contact MD if you develop any problems with your incision/wound--redness, swelling, increase in pain, drainage or if you develop fever or chills.    Functional status:  ___ No restrictions     ___ Walk up steps independently ___ 24/7 supervision/assistance   ___ Walk up steps with assistance _X__ Intermittent supervision/assistance  ___ Bathe/dress independently _X__ Walk with walker    _x__ Bathe/dress with assistance ___ Walk Independently    ___ Shower independently ___ Walk with assistance    ___ Shower with assistance _X__ No alcohol     ___ Return to work/school ________    COMMUNITY REFERRALS UPON DISCHARGE:    Home Health:   PT      RN                      Agency:  [facility] Phone: [phone]    Special Instructions:    My questions have been answered and I understand these instructions. I will adhere to these goals and the provided educational materials after my discharge from the hospital.  Patient/Caregiver Signature _______________________________ Date __________  Clinician Signature _______________________________________ Date __________  Please bring this form and your medication list with you to all your follow-up doctor's appointments.

## 2016-12-04 NOTE — Progress Notes (Signed)
Hydrocodone 5/325mg  # 20 pills --one every 6 hours prn pain written for patient

## 2016-12-04 NOTE — Progress Notes (Signed)
Patient removed, cleansed and dressed incision to left foot with supervision. Patient needed hands on assistance with last part of ace wrap application. No questions about dressing changes at this time.

## 2016-12-04 NOTE — Progress Notes (Signed)
Physical Therapy Discharge Summary  Patient Details  Name: Andrea [name] MRN: [id number] Date of Birth: 05-22-1950  Today's Date: 12/04/2016   Patient has met 8 of 8 long term goals due to improved activity tolerance, improved balance, improved postural control, increased strength, decreased pain and ability to compensate for deficits.  Patient to discharge at an ambulatory level Modified Independent.   Patient's family to provide intermittent assist as needed.  Reasons goals not met: All goals met  Recommendation:  Patient will benefit from ongoing skilled PT services in home health setting to continue to advance safe functional mobility, address ongoing impairments in strength, balance, activity tolerance, and minimize fall risk.  Equipment: No equipment provided  Reasons for discharge: treatment goals met and discharge from hospital  Patient/family agrees with progress made and goals achieved: Yes  PT Discharge Precautions/Restrictions Precautions Precautions: Fall Restrictions Weight Bearing Restrictions: Yes LLE Weight Bearing: (weight bearing through heel in darco shoe) Other Position/Activity Restrictions: Darco shoe for left foot with weightbearing. Vital Signs Therapy Vitals Temp: 98.9 F (37.2 C) Temp Source: Oral Pulse Rate: 80 Resp: 18 BP: 126/66 Patient Position (if appropriate): Lying Oxygen Therapy SpO2: 99 % O2 Device: Not Delivered Pain  denies pain Vision/Perception  Perception Perception: Within Functional Limits Praxis Praxis: Intact  Cognition Overall Cognitive Status: Within Functional Limits for tasks assessed Arousal/Alertness: Awake/alert Orientation Level: Oriented X4 Sustained Attention: Appears intact Memory: Appears intact Awareness: Appears intact Problem Solving: Appears intact Safety/Judgment: Appears intact Sensation Sensation Light Touch: Appears Intact Proprioception: Appears Intact Coordination Gross Motor  Movements are Fluid and Coordinated: Yes Fine Motor Movements are Fluid and Coordinated: Yes Motor  Motor Motor: Within Functional Limits  Mobility Bed Mobility Bed Mobility: Supine to Sit;Sit to Supine Supine to Sit: 6: Modified independent (Device/Increase time);HOB flat Sit to Supine: 6: Modified independent (Device/Increase time);HOB flat Transfers Sit to Stand: 6: Modified independent (Device/Increase time);With armrests Stand to Sit: 6: Modified independent (Device/Increase time) Stand Pivot Transfers: 6: Modified independent (Device/Increase time);With armrests Locomotion  Ambulation Ambulation: Yes Ambulation/Gait Assistance: 6: Modified independent (Device/Increase time) Ambulation Distance (Feet): 150 Feet Assistive device: Straight cane Gait Gait: Yes Gait Pattern: Impaired Gait Pattern: Decreased stance time - left;Decreased stride length;Lateral hip instability;Abducted - left Stairs / Additional Locomotion Stairs: Yes Stairs Assistance: 5: Supervision Stairs Assistance Details: Verbal cues for technique;Verbal cues for precautions/safety Stair Management Technique: Two rails;Step to pattern;Forwards Number of Stairs: 12 Height of Stairs: 6 Ramp: 5: Supervision Curb: 5: Psychiatric nurse: Yes Wheelchair Assistance: 6: Modified independent (Device/Increase time) Environmental health practitioner: Both upper extremities Wheelchair Parts Management: Needs assistance  Trunk/Postural Assessment  Cervical Assessment Cervical Assessment: Within Water engineer Thoracic Assessment: Within Functional Limits Lumbar Assessment Lumbar Assessment: Within Functional Limits Postural Control Postural Control: Within Functional Limits  Balance Balance Balance Assessed: Yes Standardized Balance Assessment Standardized Balance Assessment: Berg Balance Test;Timed Up and Go Test Berg Balance Test Sit to Stand: Able to stand  without using hands and stabilize independently Standing Unsupported: Able to stand safely 2 minutes Sitting with Back Unsupported but Feet Supported on Floor or Stool: Able to sit safely and securely 2 minutes Stand to Sit: Controls descent by using hands Transfers: Able to transfer safely, minor use of hands Standing Unsupported with Eyes Closed: Able to stand 10 seconds with supervision Standing Ubsupported with Feet Together: Needs help to attain position and unable to hold for 15 seconds From Standing, Reach Forward with Outstretched Arm: Can reach forward >5 cm safely (  2") From Standing Position, Pick up Object from Floor: Able to pick up shoe, needs supervision From Standing Position, Turn to Look Behind Over each Shoulder: Looks behind one side only/other side shows less weight shift Turn 360 Degrees: Able to turn 360 degrees safely but slowly Standing Unsupported, Alternately Place Feet on Step/Stool: Needs assistance to keep from falling or unable to try Standing Unsupported, One Foot in Front: Able to take small step independently and hold 30 seconds Standing on One Leg: Able to lift leg independently and hold > 10 seconds Total Score: 38 Timed Up and Go Test TUG: Normal TUG Normal TUG (seconds): 22 Static Sitting Balance Static Sitting - Level of Assistance: 6: Modified independent (Device/Increase time) Dynamic Sitting Balance Dynamic Sitting - Level of Assistance: 6: Modified independent (Device/Increase time) Static Standing Balance Static Standing - Level of Assistance: 6: Modified independent (Device/Increase time) Dynamic Standing Balance Dynamic Standing - Level of Assistance: 6: Modified independent (Device/Increase time) Extremity Assessment  RUE Assessment RUE Assessment: Within Functional Limits LUE Assessment LUE Assessment: Within Functional Limits RLE Assessment RLE Assessment: Within Functional Limits(grossly 4+/5 throughout) LLE Assessment LLE Assessment:  Within Functional Limits(grossly 4/5 throughout; ankle weakness d/t transmet amputation)    See Function Navigator for Current Functional Status.  Benjiman Core Tygielski 12/04/2016, 8:08 AM

## 2016-12-04 NOTE — Progress Notes (Signed)
Physical Therapy Session Note  Patient Details  Name: Andrea [name] MRN: [id number] Date of Birth: 1950/11/20  Today's Date: 12/04/2016 PT Individual Time: 0800-0830 PT Individual Time Calculation (min): 30 min  and Today's Date: 12/04/2016 PT Concurrent Time: 0830-0930 PT Concurrent Time Calculation (min): 60 min  Short Term Goals: Week 2:  PT Short Term Goal 1 (Week 2): =LTG due to estimated LOS  Skilled Therapeutic Interventions/Progress Updates: Pt received seated in w/c, denies pain and agreeable to treatment. W/c propulsion x150' with modI for UE strengthening and endurance. Assessed car transfer, gait on ramp, mulch surface and curb with SPC and S. Stairs x12 6" height with B handrails and S. Performed Berg and TUG with scores as indicated in d/c summary indicative of high risk for falls; reviewed recommendation with pt to continue use of cane at home, S for stairs and community mobility. Performed BLE strengthening exercise HEP with no cues required for technique or performance. Dynamic gait weaving around cones with modI, increased time. Gait to [facility] and modI. Remained seated in w/c at end of session with all needs in reach; no further questions or concerns regarding d/c at this time.      Therapy Documentation Precautions:  Precautions Precautions: Fall Restrictions Weight Bearing Restrictions: Yes LLE Weight Bearing: Non weight bearing Other Position/Activity Restrictions: Darco shoe for left foot with weightbearing.   See Function Navigator for Current Functional Status.   Therapy/Group: Individual Therapy and Concurent  Vista Lawmanlizabeth J Tygielski 12/04/2016, 8:08 AM

## 2016-12-04 NOTE — Discharge Summary (Signed)
Occupational Therapy Discharge Summary  Patient Details  Name: Andrea [name] MRN: [id number] Date of Birth: [birth year]   Today's Date: 12/04/2016 OT Individual Time: 2725-3664 OT Individual Time Calculation (min): 45 min   Patient has met 8 of 8 long term goals due to improved activity tolerance, improved balance, postural control, ability to compensate for deficits, improved attention, improved awareness and improved coordination.  Pt made steady progress and is mod I for BADLs and supervision for IADLs and shower transfer.  Pt does not anticipate taking a shower until L foot heals. Pt anticipates taking sponge bath at night prior to going to bed.  Pt verbalized understanding of home safety recommendations and energy conservation strategies.  Family/caregivers have not been present for therapy.  Pt discharging home at mod I level. Patient to discharge at overall Modified Independent level.    Recommendation:  No f/u OT services recommended at this time   Equipment: No equipment provided  Reasons for discharge: treatment goals met  Patient/family agrees with progress made and goals achieved: Yes   Skilled Therapeutic Interventions/Progress Updates:    Tx focus on functional ambulation with device, balance, and standing tolerance during meaningful leisure participation.   Pt greeted in w/c, agreeable to tx and no c/o pain. Darco shoe on Lt foot. She ambulated with SPC to dayroom with supervision, occasional use of rail in hallway for balance. Pt engaging in crochet task while standing and sitting as needed. Longest standing window without rest 8 minutes with bilateral UEs engaged. She required mod instructional cues on proper technique for this familiar fine motor task. Min cues for weightbearing through heel only with L LE. Pt reminiscing about knitting granny square quilts during task. At end of session she ambulated back to room in manner as written above. She as left in w/c with all  needs within reach.    OT Discharge Precautions/Restrictions  Precautions Precautions: Fall Restrictions Weight Bearing Restrictions: Yes LLE Weight Bearing: Non weight bearing(can bear weight through heel) Other Position/Activity Restrictions: Darco shoe for left foot with weightbearing. Vision Baseline Vision/History: No visual deficits Patient Visual Report: No change from baseline Vision Assessment?: No apparent visual deficits Perception  Perception: Within Functional Limits Praxis Praxis: Intact Cognition Overall Cognitive Status: Within Functional Limits for tasks assessed Arousal/Alertness: Awake/alert Orientation Level: Oriented X4 Sustained Attention: Appears intact Memory: Appears intact Awareness: Appears intact Problem Solving: Appears intact Reasoning: Appears intact Sequencing: Appears intact Organizing: Appears intact Safety/Judgment: Appears intact Sensation Sensation Light Touch: Appears Intact Stereognosis: Appears Intact Hot/Cold: Appears Intact Proprioception: Appears Intact Coordination Gross Motor Movements are Fluid and Coordinated: Yes Fine Motor Movements are Fluid and Coordinated: Yes Motor  Motor Motor: Within Functional Limits    Trunk/Postural Assessment  Cervical Assessment Cervical Assessment: Within Functional Limits Thoracic Assessment Thoracic Assessment: Within Functional Limits Lumbar Assessment Lumbar Assessment: Within Functional Limits Postural Control Postural Control: Within Functional Limits  Balance Static Sitting Balance Static Sitting - Level of Assistance: 6: Modified independent (Device/Increase time) Dynamic Sitting Balance Dynamic Sitting - Level of Assistance: 6: Modified independent (Device/Increase time) Extremity/Trunk Assessment RUE Assessment RUE Assessment: Within Functional Limits LUE Assessment LUE Assessment: Within Functional Limits   See Function Navigator for Current Functional  Status.  Leotis Shames [facility] 12/04/2016, 1:15 PM

## 2016-12-05 LAB — GLUCOSE, CAPILLARY: Glucose-Capillary: 130 mg/dL — ABNORMAL HIGH (ref 65–99)

## 2016-12-05 NOTE — Progress Notes (Signed)
Subjective/Complaints: Slept well.  Just waking up.  Feels that she is prepared to go home.  Pain under control  ROS: pt denies nausea, vomiting, diarrhea, cough, shortness of breath or chest pain   Objective: Vital Signs: Blood pressure 128/60, pulse 65, temperature 98.1 F (36.7 C), temperature source Oral, resp. rate 17, height 5' 3"  (1.6 m), weight 55.2 kg (121 lb 9.6 oz), SpO2 96 %. No results found. Results for orders placed or performed during the hospital encounter of 11/21/16 (from the past 72 hour(s))  Glucose, capillary     Status: Abnormal   Collection Time: 12/02/16 12:17 PM  Result Value Ref Range   Glucose-Capillary 121 (H) 65 - 99 mg/dL  Glucose, capillary     Status: Abnormal   Collection Time: 12/02/16  4:42 PM  Result Value Ref Range   Glucose-Capillary 127 (H) 65 - 99 mg/dL  Glucose, capillary     Status: Abnormal   Collection Time: 12/02/16  8:30 PM  Result Value Ref Range   Glucose-Capillary 110 (H) 65 - 99 mg/dL  Glucose, capillary     Status: Abnormal   Collection Time: 12/03/16  9:58 AM  Result Value Ref Range   Glucose-Capillary 110 (H) 65 - 99 mg/dL  Glucose, capillary     Status: Abnormal   Collection Time: 12/03/16 12:00 PM  Result Value Ref Range   Glucose-Capillary 126 (H) 65 - 99 mg/dL  Glucose, capillary     Status: Abnormal   Collection Time: 12/03/16  5:05 PM  Result Value Ref Range   Glucose-Capillary 164 (H) 65 - 99 mg/dL  Glucose, capillary     Status: Abnormal   Collection Time: 12/03/16  9:30 PM  Result Value Ref Range   Glucose-Capillary 148 (H) 65 - 99 mg/dL  Glucose, capillary     Status: None   Collection Time: 12/04/16  6:38 AM  Result Value Ref Range   Glucose-Capillary 84 65 - 99 mg/dL  Basic metabolic panel     Status: Abnormal   Collection Time: 12/04/16  7:45 AM  Result Value Ref Range   Sodium 142 135 - 145 mmol/L   Potassium 3.7 3.5 - 5.1 mmol/L   Chloride 109 101 - 111 mmol/L   CO2 26 22 - 32 mmol/L   Glucose,  Bld 91 65 - 99 mg/dL   BUN 19 6 - 20 mg/dL   Creatinine, Ser 0.81 0.44 - 1.00 mg/dL   Calcium 8.4 (L) 8.9 - 10.3 mg/dL   GFR calc non Af Amer >60 >60 mL/min   GFR calc Af Amer >60 >60 mL/min    Comment: (NOTE) The eGFR has been calculated using the CKD EPI equation. This calculation has not been validated in all clinical situations. eGFR's persistently <60 mL/min signify possible Chronic Kidney Disease.    Anion gap 7 5 - 15  CBC     Status: Abnormal   Collection Time: 12/04/16  7:45 AM  Result Value Ref Range   WBC 7.1 4.0 - 10.5 K/uL   RBC 2.95 (L) 3.87 - 5.11 MIL/uL   Hemoglobin 8.2 (L) 12.0 - 15.0 g/dL   HCT 27.3 (L) 36.0 - 46.0 %   MCV 92.5 78.0 - 100.0 fL   MCH 27.8 26.0 - 34.0 pg   MCHC 30.0 30.0 - 36.0 g/dL   RDW 15.6 (H) 11.5 - 15.5 %   Platelets 316 150 - 400 K/uL  Glucose, capillary     Status: Abnormal   Collection Time: 12/04/16 11:59  AM  Result Value Ref Range   Glucose-Capillary 118 (H) 65 - 99 mg/dL  Glucose, capillary     Status: Abnormal   Collection Time: 12/04/16  4:51 PM  Result Value Ref Range   Glucose-Capillary 121 (H) 65 - 99 mg/dL  Glucose, capillary     Status: Abnormal   Collection Time: 12/04/16  9:19 PM  Result Value Ref Range   Glucose-Capillary 170 (H) 65 - 99 mg/dL  Glucose, capillary     Status: Abnormal   Collection Time: 12/05/16  6:33 AM  Result Value Ref Range   Glucose-Capillary 130 (H) 65 - 99 mg/dL     HEENT: Normocephalic, atraumatic. Cardio:  RRR without murmur. No JVD  Resp: CTA Bilaterally without wheezes or rales. Normal effort     GI: BS positive and nondistended Skin:     Left foot incision is clean Neuro: Alert/Oriented Motor 5/5 bilateral deltoids, biceps, triceps, grip  4/5 in the right hip flexion, knee extensor,   3/5 ankle dorsi/plantar flexors  4-/5 left hip flexion, knee extension--no changes Musc/Skel: Left foot tenderness to palpation Gen. NAD   Assessment/Plan: 1. Functional deficits secondary to left  TMA, nonweightbearing which require 3+ hours per day of interdisciplinary therapy in a comprehensive inpatient rehab setting. Physiatrist is providing close team supervision and 24 hour management of active medical problems listed below. Physiatrist and rehab team continue to assess barriers to discharge/monitor patient progress toward functional and medical goals. FIM: Function - Bathing Position: Wheelchair/chair at sink Body parts bathed by patient: Right arm, Left arm, Chest, Right upper leg, Left upper leg, Right lower leg, Left lower leg, Abdomen, Front perineal area, Buttocks Body parts bathed by helper: Back Assist Level: More than reasonable time  Function- Upper Body Dressing/Undressing What is the patient wearing?: Pull over shirt/dress, Bra Bra - Perfomed by patient: Thread/unthread left bra strap, Thread/unthread right bra strap, Hook/unhook bra (pull down sports bra) Bra - Perfomed by helper: Hook/unhook bra (pull down sports bra) Pull over shirt/dress - Perfomed by patient: Thread/unthread right sleeve, Thread/unthread left sleeve, Put head through opening, Pull shirt over trunk Assist Level: More than reasonable time Set up : To obtain clothing/put away Function - Lower Body Dressing/Undressing What is the patient wearing?: Underwear, Pants, Non-skid slipper socks, Shoes Position: Wheelchair/chair at sink Underwear - Performed by patient: Thread/unthread right underwear leg, Thread/unthread left underwear leg, Pull underwear up/down Pants- Performed by patient: Thread/unthread right pants leg, Thread/unthread left pants leg, Pull pants up/down, Fasten/unfasten pants Non-skid slipper socks- Performed by patient: Don/doff right sock Shoes - Performed by patient: Don/doff left shoe, Don/doff right shoe, Fasten left Shoes - Performed by helper: Don/doff left shoe Assist for footwear: Supervision/touching assist Assist for lower body dressing: More than reasonable  time  Function - Toileting Toileting steps completed by patient: Adjust clothing prior to toileting, Performs perineal hygiene, Adjust clothing after toileting Toileting Assistive Devices: Grab bar or rail Assist level: No help/no cues  Function Midwife transfer assistive device: Grab bar, Cane Assist level to toilet: No Help, No cues Assist level from toilet: No Help, No cues  Function - Chair/bed transfer Chair/bed transfer method: Ambulatory Chair/bed transfer assist level: No Help, no cues, assistive device, takes more than a reasonable amount of time Chair/bed transfer assistive device: Armrests Chair/bed transfer details: Verbal cues for precautions/safety  Function - Locomotion: Wheelchair Will patient use wheelchair at discharge?: Yes Type: Manual Max wheelchair distance: 150 Assist Level: No help, No cues, assistive device,  takes more than reasonable amount of time Assist Level: No help, No cues, assistive device, takes more than reasonable amount of time Assist Level: No help, No cues, assistive device, takes more than reasonable amount of time Turns around,maneuvers to table,bed, and toilet,negotiates 3% grade,maneuvers on rugs and over doorsills: No Function - Locomotion: Ambulation Assistive device: Cane-straight Max distance: 150' Assist level: No help, No cues, assistive device, takes more than a reasonable amount of time Assist level: No help, No cues, assistive device, takes more than a reasonable amount of time Walk 50 feet with 2 turns activity did not occur: Safety/medical concerns Assist level: No help, No cues, assistive device, takes more than a reasonable amount of time Walk 150 feet activity did not occur: Safety/medical concerns Assist level: No help, No cues, assistive device, takes more than a reasonable amount of time Walk 10 feet on uneven surfaces activity did not occur: Safety/medical concerns Assist level: Supervision or verbal  cues  Function - Comprehension Comprehension: Auditory Comprehension assist level: Follows complex conversation/direction with extra time/assistive device  Function - Expression Expression: Verbal Expression assist level: Expresses complex ideas: With extra time/assistive device  Function - Social Interaction Social Interaction assist level: Interacts appropriately with others - No medications needed.  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 DVT Prophylaxis/Anticoagulation: Subcutaneous heparin. Monitor for any bleeding episodes 3. Pain Management:Cymbalta 60 mg daily,   hydrocodone as needed  -  phantom limb pain.  Increased Lyrica to 142m TID with good results  4. Mood:Provide emotional support 5. Neuropsych: This patientiscapable of making decisions on herown behalf. 6. Skin/Wound Care:Routine skin checks.  Can hold off on dressing change today 7. Fluids/Electrolytes/Nutrition:  -BUN and creatinine are now normal.  -Continue to hold Lasix and hydrochlorothiazide  8.Acute blood loss anemia.      hemoglobin 8.2  .       9.Diabetes mellitus peripheral neuropathy hemoglobin A1c 7.3. Lantus insulin 30 units daily at bedtime, Glucophage 1000 mg twice a day.Diabetic teaching Reduced Lantus to 25 units nightly   improved control  -Reviewed the importance of glucose control with  patient 10.Hypertension.(HCTZ 25 mg daily, Lasix 10 mg daily), lisinopril 5 mg daily, Lopressor 12.5 mg twice a day Vitals:   12/05/16 0736 12/05/16 0803  BP:  128/60  Pulse: 76 65  Resp: 17   Temp:    SpO2: 96%     -Blood pressures under control.  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 Continue aspirin and Plavix. No chest pain or shortness of breath 12.Hyperlipidemia. Lipitor 13.Tobacco abuse. Patient recently stopped smoking 7 months ago. Provide counseling 14. AKI   Cr 0.9    LOS (Days) 14 A FACE TO FACE EVALUATION WAS PERFORMED  Andrea [name] T 12/05/2016, 8:12 AM

## 2016-12-05 NOTE — Progress Notes (Signed)
Went through discharge information with patient and care-giver. Patient demonstrated ability to change dressing yesterday. No further questions at this time. Belongings packed, patient escorted to car by NT.

## 2016-12-06 NOTE — Discharge Summary (Signed)
Discharge summary job # [id number]

## 2016-12-07 ENCOUNTER — Telehealth: Payer: Self-pay

## 2016-12-07 ENCOUNTER — Other Ambulatory Visit: Payer: Self-pay | Admitting: Pharmacist

## 2016-12-07 MED ORDER — INSULIN PEN NEEDLE 31G X 5 MM MISC: 2 refills | Status: AC

## 2016-12-07 MED FILL — TRUEPLUS PEN NDL 31GX5/16": 31G X 8 MM | 30 days supply | Qty: 100 | Fill #0

## 2016-12-07 MED FILL — TRUEPLUS PEN NDL 31GX5/16: 31G X 8 MM | 30 days supply | Qty: 100 | Fill #0

## 2016-12-07 NOTE — Telephone Encounter (Signed)
[facility] Post-discharge Follow-Up Phone Call:  [facility] She called today to schedule a post discharge follow up visit.   Date of Discharge:  12/05/2016 Principal Discharge Diagnosis(es):  Left transmetatarsal amputation - 11/19/16 PVD, DM, peripheral neuropathy.  Post-discharge Communication: (Clearly document all attempts clearly and date contact made) call placed to the patient. Call Completed: Yes                    With Whom: patient Interpreter Needed: No     Please check all that apply:  X Patient is knowledgeable of his/her condition(s) and/or treatment. X  Patient is caring for self at home.  - she said that her sister comes to stay with her during the day at times.  ? Patient is receiving assist at home from family and/or caregiver. Family and/or caregiver is knowledgeable of patient's condition(s) and/or treatment. X  Patient is receiving home health services. If so, name of agency. - [facility].  The patient stated that she has not heard from the agency yet.     Medication Reconciliation:  X   Medication list reviewed with patient. - she said that she did not need to review the list with this CM. She did confirm that she has the discharge medication list. Instructed her to review the medication list again for the new and changed medications as well as those that have been discontinued. She is to start taking tylenol and colace and is to stop: furosemide, gabapentin, hydrochlorothiazide, ibuprofen and potassium chloride.  This CM reviewed the changes with her that were made with the lantus, metformin and lyrica.    ? Patient obtained all discharge medications. If not, why?   - She said that she has all medications and is taking them as ordered.  She then noted that she will need a prescription for her lantus needles. She said that she has about enough for month.  Viann FishStacey Hammer RPH/[facility] notified of the need for the lantus  needles and she said she would order for the patient. She said that she  did not have any questions about her medications.  She also reported that she has been checking her blood sugars and keeping a log of results. This morning her blood sugar was 127. Instructed her to bring the log and all of her medications with her to her appointment on 12/11/16.     Activities of Daily Living:  ? Independent X  Needs assist (describe; ? home DME used)n - has cane and walker.  ? [facility] (describe, ? home DME used)   Community resources in place for patient:  ? None  X  Home Health/Home DME - [facility] for RN and PT.  ? Assisted Living ? Support Group          Patient Education:  The patient stated that she is doing her own wound care to surgical site.- telfa, 4x4, gauze/ace wrap.  She noted that there is a small amount of blood noted on the dressing when it is removed but this is unchanged from when she was in the hospital. She stated that there are no signs of infection.   - message sent to Dr Venetia NightAmao regarding status of wound         Questions/Concerns discussed: she had no questions/concerns at this time. Her appointment for 12/11/16 @ 1400 was confirmed and she said that her sister would be able to transport  her to the clinic.

## 2016-12-07 NOTE — Discharge Summary (Signed)
NAME:  Quinn AxeSAINTSING, Etana                ACCOUNT NO.:  [id number]  MEDICAL RECORD NO.:  [id number]  LOCATION:                                 FACILITY:  PHYSICIAN:  Ranelle OysterZachary T. Swartz, M.D.DATE OF BIRTH:  October 26, 1950  DATE OF ADMISSION:  11/21/2016 DATE OF DISCHARGE:  12/05/2016                              DISCHARGE SUMMARY   DISCHARGE DIAGNOSES: 1. Left transmetatarsal amputation on November 19, 2016. 2. Subcutaneous heparin for DVT prophylaxis. 3. Pain management. 4. Acute blood loss anemia. 5. Diabetes mellitus. 6. Peripheral neuropathy. 7. Hypertension. 8. Coronary artery disease with coronary artery bypass grafting. 9. Hyperlipidemia. 10.Tobacco abuse.  This is a 66 year old right-handed female with history of diabetes mellitus, CAD, maintained on aspirin and Plavix, recently stopped smoking.  Peripheral vascular disease with recent left first and second toe amputations on October 13, 2016.  [facility], recently returned home.  She was using a cane prior to admission.  She has a sister in the area and 2 supportive daughters.  Presented on November 19, 2016, with surgical wound dehiscence, gangrenous changes of left foot.  Wound was not felt to be salvageable and underwent left transmetatarsal amputation on November 19, 2016.  Hospital course, pain management.  Acute blood loss anemia at 7.6.  Subcutaneous heparin for DVT prophylaxis.  Physical and occupational therapy ongoing.  The patient was admitted for comprehensive rehab program.  PAST MEDICAL HISTORY:  See discharge diagnoses.  SOCIAL HISTORY:  Was using a cane prior to admission.  Has a sister and 2 supportive daughters.  FUNCTIONAL STATUS UPON ADMISSION TO REHAB SERVICES:  Minimal guard using a rolling walker for ambulation, minimal guard sit to stand, min to mod assist activities of daily living.  PHYSICAL EXAMINATION:  VITAL SIGNS:  Blood pressure 159/59, pulse 57, temperature 98, and  respirations 18. GENERAL:  This was an alert female, in no acute distress, oriented x3. HEENT:  EOMs intact. CARDIAC:  Rate controlled. ABDOMEN:  Soft, nontender.  Good bowel sounds. LUNGS:  Clear to auscultation without wheeze. EXTREMITIES:  Amputation site was dressed, appropriately tender.  REHABILITATION HOSPITAL COURSE:  The patient was admitted to Inpatient Rehab Services.  Therapies initiated on a 3-hour daily basis consisting of physical therapy, occupational therapy, and rehabilitation nursing. The following issues were addressed during the patient's rehabilitation stay.  Pertaining to Ms. Szeto's left transmetatarsal amputation, surgical site healing nicely, she would follow up with Vascular Surgery. Home health therapies had been arranged.  Subcutaneous heparin for DVT prophylaxis, no bleeding episodes.  Pain management with the use of Cymbalta and hydrocodone.  She was on Lyrica increased to 100 mg 3 times daily.  Acute blood loss anemia, 8.2 and stable.  Diabetes mellitus, peripheral neuropathy, hemoglobin A1c of 7.3, insulin therapy as well as metformin as advised with full diabetic teaching.  Blood pressure is well controlled with present regimen.  She would follow up with her primary MD, monitoring for any orthostatic changes, no chest pain or shortness of breath, with noted history of CAD with CABG, remaining on aspirin and Plavix.  She had stopped smoking approximately 7 months ago, again receiving full counsel in regard to cessation of  nicotine products.  The patient received weekly collaborative interdisciplinary team conferences with weekly conferences held.  The patient working with energy conservation techniques, modified independence to propel her wheelchair, car transfers, ambulation on a ramp as well as uneven surfaces with a straight-point cane and supervision.  Stairs with bilateral hand rails with supervision.  Performed bilateral lower extremity  strengthening exercises with no cues.  She could gather her belongings for activities of daily living and homemaking.  Made steady progress to be modified independence for basic activities of daily living and supervision.  Full family teaching was completed with plan discharge to home.  DISCHARGE MEDICATIONS:  At time of dictation included: 1. Tylenol as needed. 2. Albuterol inhaler 2 puffs every 6 hours as needed. 3. Aspirin 81 mg p.o. daily. 4. Lipitor 80 mg p.o. daily. 5. Plavix 75 mg p.o. daily. 6. Colace 100 mg p.o. daily. 7. Cymbalta 60 mg p.o. daily. 8. Advair 1 puff twice daily. 9. Hydrocodone 5-325 one tablet every 6 hours as needed pain. 10.Lantus insulin 25 units at bedtime. 11.Lisinopril 5 mg p.o. daily. 12.Metformin 1000 mg tablet 1/2 tablet twice daily. 13.Lopressor 12.5 mg p.o. b.i.d. 14.Ditropan 5 mg p.o. b.i.d. 15.Protonix 20 mg p.o. daily. 16.Lyrica 100 mg p.o. t.i.d.  DIET:  Her diet was a diabetic diet.  She would follow up with Dr. Faith RogueZachary Swartz at the Outpatient Rehab Service office as advised; Dr. Lemar LivingsBrandon Cain, Vascular Surgery, call for appointment; Dr. Venetia NightAmao, Medical Management.     Mariam Dollaraniel Kaylon Laroche, P.A.   ______________________________ Ranelle OysterZachary T. Swartz, M.D.    DA/MEDQ  D:  12/06/2016  T:  12/06/2016  Job:  [id number]  cc:   Ranelle OysterZachary T. Swartz, M.D. Jaclyn ShaggyEnobong Amao, MD Dr. Lemar LivingsBrandon Cain

## 2016-12-08 ENCOUNTER — Telehealth: Payer: Self-pay

## 2016-12-08 ENCOUNTER — Telehealth: Payer: Self-pay | Admitting: Family Medicine

## 2016-12-08 NOTE — Telephone Encounter (Signed)
Call placed to [facility] # [phone] to check on the status of a referral that was placed for patient for home health and physical therapy. Spoke with Victorino DikeJennifer who then transferred me to Casimiro NeedleMichael. Casimiro NeedleMichael confirmed that referral was received and that nurse will be visiting patient today 11/27.

## 2016-12-08 NOTE — Telephone Encounter (Signed)
error 

## 2016-12-08 NOTE — Telephone Encounter (Signed)
[facility] that Dr Venetia NightAmao will be following up with the patient. She also requested an order for SW and that was provided.

## 2016-12-11 ENCOUNTER — Ambulatory Visit: Payer: BLUE CROSS/BLUE SHIELD | Attending: Family Medicine | Admitting: Family Medicine

## 2016-12-11 ENCOUNTER — Encounter: Payer: Self-pay | Admitting: Family Medicine

## 2016-12-11 ENCOUNTER — Ambulatory Visit: Payer: BLUE CROSS/BLUE SHIELD | Attending: Family Medicine | Admitting: Licensed Clinical Social Worker

## 2016-12-11 VITALS — BP 132/76 | HR 81 | Temp 98.5°F | Ht 62.0 in | Wt 128.2 lb

## 2016-12-11 DIAGNOSIS — Z89432 Acquired absence of left foot: Secondary | ICD-10-CM

## 2016-12-11 DIAGNOSIS — Z9889 Other specified postprocedural states: Secondary | ICD-10-CM | POA: Insufficient documentation

## 2016-12-11 DIAGNOSIS — J45909 Unspecified asthma, uncomplicated: Secondary | ICD-10-CM | POA: Insufficient documentation

## 2016-12-11 DIAGNOSIS — I739 Peripheral vascular disease, unspecified: Secondary | ICD-10-CM

## 2016-12-11 DIAGNOSIS — J4520 Mild intermittent asthma, uncomplicated: Secondary | ICD-10-CM | POA: Diagnosis not present

## 2016-12-11 DIAGNOSIS — Z794 Long term (current) use of insulin: Secondary | ICD-10-CM | POA: Diagnosis not present

## 2016-12-11 DIAGNOSIS — Z7982 Long term (current) use of aspirin: Secondary | ICD-10-CM | POA: Diagnosis not present

## 2016-12-11 DIAGNOSIS — Z7951 Long term (current) use of inhaled steroids: Secondary | ICD-10-CM | POA: Diagnosis not present

## 2016-12-11 DIAGNOSIS — Z89439 Acquired absence of unspecified foot: Secondary | ICD-10-CM | POA: Insufficient documentation

## 2016-12-11 DIAGNOSIS — M797 Fibromyalgia: Secondary | ICD-10-CM | POA: Insufficient documentation

## 2016-12-11 DIAGNOSIS — Z89412 Acquired absence of left great toe: Secondary | ICD-10-CM | POA: Insufficient documentation

## 2016-12-11 DIAGNOSIS — Z79899 Other long term (current) drug therapy: Secondary | ICD-10-CM | POA: Insufficient documentation

## 2016-12-11 DIAGNOSIS — E1151 Type 2 diabetes mellitus with diabetic peripheral angiopathy without gangrene: Secondary | ICD-10-CM | POA: Insufficient documentation

## 2016-12-11 DIAGNOSIS — F4323 Adjustment disorder with mixed anxiety and depressed mood: Secondary | ICD-10-CM | POA: Diagnosis not present

## 2016-12-11 DIAGNOSIS — N3281 Overactive bladder: Secondary | ICD-10-CM

## 2016-12-11 DIAGNOSIS — E119 Type 2 diabetes mellitus without complications: Secondary | ICD-10-CM | POA: Diagnosis not present

## 2016-12-11 DIAGNOSIS — Z951 Presence of aortocoronary bypass graft: Secondary | ICD-10-CM | POA: Diagnosis not present

## 2016-12-11 DIAGNOSIS — K219 Gastro-esophageal reflux disease without esophagitis: Secondary | ICD-10-CM | POA: Diagnosis not present

## 2016-12-11 DIAGNOSIS — I2510 Atherosclerotic heart disease of native coronary artery without angina pectoris: Secondary | ICD-10-CM | POA: Diagnosis not present

## 2016-12-11 DIAGNOSIS — Z7902 Long term (current) use of antithrombotics/antiplatelets: Secondary | ICD-10-CM | POA: Diagnosis not present

## 2016-12-11 DIAGNOSIS — E7800 Pure hypercholesterolemia, unspecified: Secondary | ICD-10-CM | POA: Insufficient documentation

## 2016-12-11 DIAGNOSIS — I10 Essential (primary) hypertension: Secondary | ICD-10-CM | POA: Diagnosis not present

## 2016-12-11 DIAGNOSIS — Z955 Presence of coronary angioplasty implant and graft: Secondary | ICD-10-CM | POA: Insufficient documentation

## 2016-12-11 DIAGNOSIS — Z89422 Acquired absence of other left toe(s): Secondary | ICD-10-CM | POA: Insufficient documentation

## 2016-12-11 LAB — GLUCOSE, POCT (MANUAL RESULT ENTRY): POC Glucose: 203 mg/dl — AB (ref 70–99)

## 2016-12-11 MED ORDER — METFORMIN HCL 500 MG PO TABS: 500.0000 mg | ORAL_TABLET | Freq: Two times a day (BID) | ORAL | 6 refills | Status: AC

## 2016-12-11 MED ORDER — FLUTICASONE-SALMETEROL 100-50 MCG/DOSE IN AEPB: 1.0000 | INHALATION_SPRAY | Freq: Two times a day (BID) | RESPIRATORY_TRACT | 1 refills | Status: AC

## 2016-12-11 MED ORDER — LISINOPRIL 5 MG PO TABS: 5.0000 mg | ORAL_TABLET | Freq: Every day | ORAL | 6 refills | Status: DC

## 2016-12-11 MED ORDER — ATORVASTATIN CALCIUM 80 MG PO TABS: 80.0000 mg | ORAL_TABLET | Freq: Every day | ORAL | 1 refills | Status: DC

## 2016-12-11 MED ORDER — ALBUTEROL SULFATE HFA 108 (90 BASE) MCG/ACT IN AERS: 2.0000 | INHALATION_SPRAY | Freq: Four times a day (QID) | RESPIRATORY_TRACT | 3 refills | Status: AC | PRN

## 2016-12-11 MED ORDER — METOPROLOL TARTRATE 25 MG PO TABS: 12.5000 mg | ORAL_TABLET | Freq: Two times a day (BID) | ORAL | 6 refills | Status: AC

## 2016-12-11 MED ORDER — DULOXETINE HCL 60 MG PO CPEP: 60.0000 mg | ORAL_CAPSULE | Freq: Every day | ORAL | 6 refills | Status: AC

## 2016-12-11 MED ORDER — CLOPIDOGREL BISULFATE 75 MG PO TABS: 75.0000 mg | ORAL_TABLET | Freq: Every day | ORAL | 6 refills | Status: AC

## 2016-12-11 MED ORDER — DOCUSATE SODIUM 100 MG PO CAPS: 100.0000 mg | ORAL_CAPSULE | Freq: Every day | ORAL | 1 refills | Status: AC

## 2016-12-11 MED ORDER — OXYBUTYNIN CHLORIDE 5 MG PO TABS: 5.0000 mg | ORAL_TABLET | Freq: Two times a day (BID) | ORAL | 6 refills | Status: AC

## 2016-12-11 MED ORDER — LISINOPRIL 5 MG PO TABS: 5.0000 mg | ORAL_TABLET | Freq: Every day | ORAL | 6 refills | Status: AC

## 2016-12-11 MED ORDER — PANTOPRAZOLE SODIUM 20 MG PO TBEC: 20.0000 mg | DELAYED_RELEASE_TABLET | Freq: Every day | ORAL | 5 refills | Status: AC

## 2016-12-11 MED FILL — PANTOPRAZOLE SOD DR 20 MG T: 20 | 30 days supply | Qty: 30 | Fill #0

## 2016-12-11 MED FILL — ADVAIR 100/50 DISKUS: 100-50 | 30 days supply | Qty: 60 | Fill #0

## 2016-12-11 MED FILL — ATORVASTATIN 80 MG TABLET: 80 | 90 days supply | Qty: 90 | Fill #0

## 2016-12-11 MED FILL — DULoxetine HCL 60 MG CPEP: 60 | 30 days supply | Qty: 30 | Fill #0

## 2016-12-11 MED FILL — OXYBUTYNIN 5 MG TABLET: 5 | 30 days supply | Qty: 60 | Fill #0

## 2016-12-11 MED FILL — metFORMIN HCL 1000 MG TABS: 1000 | 30 days supply | Qty: 30 | Fill #0

## 2016-12-11 MED FILL — VENTOLIN HFA 90 MCG INHALER: 108 (90 BAS | 25 days supply | Qty: 18 | Fill #0

## 2016-12-11 MED FILL — METOPROLOL TARTRATE 25 MG T: 25 | 30 days supply | Qty: 30 | Fill #0

## 2016-12-11 MED FILL — CLOPIDOGREL 75 MG TABLET: 75 | 30 days supply | Qty: 30 | Fill #0

## 2016-12-11 MED FILL — LISINOPRIL 5 MG TAB: 5 | 30 days supply | Qty: 30 | Fill #0

## 2016-12-11 NOTE — Patient Instructions (Signed)
Diabetes Mellitus and Food It is important for you to manage your blood sugar (glucose) level. Your blood glucose level can be greatly affected by what you eat. Eating healthier foods in the appropriate amounts throughout the day at about the same time each day will help you control your blood glucose level. It can also help slow or prevent worsening of your diabetes mellitus. Healthy eating may even help you improve the level of your blood pressure and reach or maintain a healthy weight. General recommendations for healthful eating and cooking habits include:  Eating meals and snacks regularly. Avoid going long periods of time without eating to lose weight.  Eating a diet that consists mainly of plant-based foods, such as fruits, vegetables, nuts, legumes, and whole grains.  Using low-heat cooking methods, such as baking, instead of high-heat cooking methods, such as deep frying.  Work with your dietitian to make sure you understand how to use the Nutrition Facts information on food labels. How can food affect me? Carbohydrates Carbohydrates affect your blood glucose level more than any other type of food. Your dietitian will help you determine how many carbohydrates to eat at each meal and teach you how to count carbohydrates. Counting carbohydrates is important to keep your blood glucose at a healthy level, especially if you are using insulin or taking certain medicines for diabetes mellitus. Alcohol Alcohol can cause sudden decreases in blood glucose (hypoglycemia), especially if you use insulin or take certain medicines for diabetes mellitus. Hypoglycemia can be a life-threatening condition. Symptoms of hypoglycemia (sleepiness, dizziness, and disorientation) are similar to symptoms of having too much alcohol. If your health care provider has given you approval to drink alcohol, do so in moderation and use the following guidelines:  Women should not have more than one drink per day, and men  should not have more than two drinks per day. One drink is equal to: ? 12 oz of beer. ? 5 oz of wine. ? 1 oz of hard liquor.  Do not drink on an empty stomach.  Keep yourself hydrated. Have water, diet soda, or unsweetened iced tea.  Regular soda, juice, and other mixers might contain a lot of carbohydrates and should be counted.  What foods are not recommended? As you make food choices, it is important to remember that all foods are not the same. Some foods have fewer nutrients per serving than other foods, even though they might have the same number of calories or carbohydrates. It is difficult to get your body what it needs when you eat foods with fewer nutrients. Examples of foods that you should avoid that are high in calories and carbohydrates but low in nutrients include:  Trans fats (most processed foods list trans fats on the Nutrition Facts label).  Regular soda.  Juice.  Candy.  Sweets, such as cake, pie, doughnuts, and cookies.  Fried foods.  What foods can I eat? Eat nutrient-rich foods, which will nourish your body and keep you healthy. The food you should eat also will depend on several factors, including:  The calories you need.  The medicines you take.  Your weight.  Your blood glucose level.  Your blood pressure level.  Your cholesterol level.  You should eat a variety of foods, including:  Protein. ? Lean cuts of meat. ? Proteins low in saturated fats, such as fish, egg whites, and beans. Avoid processed meats.  Fruits and vegetables. ? Fruits and vegetables that may help control blood glucose levels, such as apples,   mangoes, and yams.  Dairy products. ? Choose fat-free or low-fat dairy products, such as milk, yogurt, and cheese.  Grains, bread, pasta, and rice. ? Choose whole grain products, such as multigrain bread, whole oats, and brown rice. These foods may help control blood pressure.  Fats. ? Foods containing healthful fats, such as  nuts, avocado, olive oil, canola oil, and fish.  Does everyone with diabetes mellitus have the same meal plan? Because every person with diabetes mellitus is different, there is not one meal plan that works for everyone. It is very important that you meet with a dietitian who will help you create a meal plan that is just right for you. This information is not intended to replace advice given to you by your health care provider. Make sure you discuss any questions you have with your health care provider. Document Released: 09/25/2004 Document Revised: 06/06/2015 Document Reviewed: 11/25/2012 Elsevier Interactive Patient Education  2017 Elsevier Inc.  

## 2016-12-11 NOTE — Progress Notes (Signed)
TRANSITIONAL CARE CLINIC  Date of Telephone Encounter: 12/07/16  Date of 1st service: 12/11/16   Admit Date: 11/19/16 Discharge Date: 12/05/16     Subjective:  Patient ID: Andrea [name], female    DOB: 1950/05/03  Age: 66 y.o. MRN: [id number]  CC: Hospitalization Follow-up   HPI Andrea Brookingoni B Goodreau is a 66 year old female with a history of type 2 diabetes mellitus (A1c 7.2), hypertension,Coronary artery disease status post CABG in 2006, status post stent in 2000  peripheral vascular disease status post bilateral  common femoral endarterectomy and left femoral to Low knee popliteal artery bypass here for follow-up after hospitalization at the transitional care clinic after a left foot transmetatarsal amputation on 11/19/16.  She had previously undergone left first and second left toe amputations but subsequently had wound dehiscence which resulted in her left foot transmetatarsal amputation; postop course was uneventful. She was subsequently transferred to comprehensive inpatient rehab where her condition improved and she was later discharged.  She presents today and has been performing daily wound dressing changes.  She has home health nursing but they do not perform her dressing changes. She endorses minimal drainage from a single wound on the lateral aspect but otherwise is doing well; denies fevers. She is depressed due to recent amputation. She has an upcoming appointment with her vascular surgeon next week  Past Medical History:  Diagnosis Date  . Arthritis   . Asthma   . Coronary artery disease   . Diabetes mellitus without complication (HCC)   . Dyspnea   . Fibromyalgia   . GERD (gastroesophageal reflux disease)   . High cholesterol   . Hypertension   . 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 PONV (postoperative nausea and vomiting)   . PVD (peripheral vascular disease) (HCC)   . Urinary frequency   . Urinary incontinence     Past Surgical History:    Procedure Laterality Date  . ABDOMINAL AORTOGRAM W/LOWER EXTREMITY N/A 03/25/2016   Procedure: Abdominal Aortogram w/Lower Extremity;  Surgeon: Maeola HarmanBrandon Christopher Cain, MD;  [facility];  Service: Cardiovascular;  Laterality: N/A;  . ABDOMINAL AORTOGRAM W/LOWER EXTREMITY N/A 10/12/2016   Procedure: ABDOMINAL AORTOGRAM W/LOWER EXTREMITY;  Surgeon: Maeola Harmanain, Brandon Christopher, MD;  [facility];  Service: Cardiovascular;  Laterality: N/A;  . ABDOMINAL HYSTERECTOMY    . AMPUTATION TOE Left 10/13/2016   Procedure: AMPUTATION LEFT GREAT TOE AND SECOND TOE;  Surgeon: Maeola Harmanain, Brandon Christopher, MD;  [facility];  Service: Vascular;  Laterality: Left;  . AORTOGRAM N/A 09/16/2016   Procedure: AORTOGRAM WITH LEFT LOWER EXTREMITY RUNOFF;  Surgeon: Sherren KernsFields, Charles E, MD;  [facility];  Service: Vascular;  Laterality: N/A;  . APPLICATION OF WOUND VAC Right 06/14/2016   Procedure: APPLICATION OF WOUND VAC;  Surgeon: Sherren KernsFields, Charles E, MD;  [facility]: [facility] OR;  Service: Vascular;  Laterality: Right;  . COLONOSCOPY    . CORONARY ARTERY BYPASS GRAFT    . CORONARY STENT INTERVENTION N/A 04/15/2016   Procedure: Coronary Stent Intervention;  Surgeon: Lennette Biharihomas A Kelly, MD;  [facility];  Service: Cardiovascular;  Laterality: N/A;  . ENDARTERECTOMY FEMORAL Right 05/21/2016   Procedure: RIGHT FEMORAL ENDARTERECTOMY;  Surgeon: Maeola Harmanain, Brandon Christopher, MD;  [facility];  Service: Vascular;  Laterality: Right;  . ENDARTERECTOMY FEMORAL Left 07/20/2016   Procedure: LEFT FEMORAL ENDARTERECTOMY;  Surgeon: Maeola Harmanain, Brandon Christopher, MD;  [facility];  Service: Vascular;  Laterality: Left;  .  FEMORAL BYPASS Left 07/20/2016   Left common femoral endarterectomy  . FEMORAL-POPLITEAL BYPASS GRAFT Right 05/21/2016   Procedure: RIGHT FEMORAL-BELOW KNEE POPLITEAL ARTERY BYPASS GRAFT USING PROPATEN GORETEX GRAFT;  Surgeon: Maeola Harman, MD;  Location: [facility] OR;  Service:  Vascular;  Laterality: Right;  . FEMORAL-POPLITEAL BYPASS GRAFT Left 07/20/2016   Procedure: LEFT FEMORAL TO BELOW KNEE POPLITEAL ARTERY  BYPASS GRAFT;  Surgeon: Maeola Harman, MD;  Location: [facility] OR;  Service: Vascular;  Laterality: Left;  . GROIN DEBRIDEMENT Right 06/14/2016   Procedure: GROIN DEBRIDEMENT;  Surgeon: Sherren Kerns, MD;  Location: [facility] OR;  Service: Vascular;  Laterality: Right;  . I&D EXTREMITY N/A 09/12/2016   Procedure: Irrigation and Debridment Left Groin; Closure of Distal Incisions Left Leg; Medial Calf and Medial Thigh;  Surgeon: Maeola Harman, MD;  Location: [facility] OR;  Service: Vascular;  Laterality: N/A;  . KNEE SURGERY    . LEFT HEART CATH AND CORS/GRAFTS ANGIOGRAPHY N/A 04/15/2016   Procedure: Left Heart Cath and Cors/Grafts Angiography;  Surgeon: Lennette Bihari, MD;  [facility];  Service: Cardiovascular;  Laterality: N/A;  . MULTIPLE TOOTH EXTRACTIONS    . MUSCLE FLAP CLOSURE Left 09/18/2016   Procedure: LEFT GRACILIS MUSCLE FLAP TO LEFT GROIN;  Surgeon: Glenna Fellows, MD;  Location: [facility] OR;  Service: Plastics;  Laterality: Left;  . PATCH ANGIOPLASTY Left 09/10/2016   Procedure: Bovine PATCH ANGIOPLASTY Left Femoral Artery and Below Knee Popliteal Artery;  Surgeon: Fransisco Hertz, MD;  Location[facility] OR;  Service: Vascular;  Laterality: Left;  . PERIPHERAL VASCULAR INTERVENTION Left 10/12/2016   Procedure: PERIPHERAL VASCULAR INTERVENTION;  Surgeon: Maeola Harman, MD;  Location: [facility] INVASIVE CV LAB;  Service: Cardiovascular;  Laterality: Left;  . REMOVAL OF GRAFT Left 09/10/2016   Procedure: Excision of Left Femoral-Popliteal Graft, Placement of Antibiotic Beads Left Leg;  Surgeon: Fransisco Hertz, MD;  Location[facility];  Service: Vascular;  Laterality: Left;  . TRANSMETATARSAL AMPUTATION Left 11/19/2016   Procedure: TRANSMETATARSAL AMPUTATION;  Surgeon: Maeola Harman, MD;  Location: [facility] OR;  Service: Vascular;  Laterality:  Left;    Allergies  Allergen Reactions  . Naproxen Itching  . Tramadol Hcl Itching     Outpatient Medications Prior to Visit  Medication Sig Dispense Refill  . acetaminophen (TYLENOL) 325 MG tablet Take 2 tablets (650 mg total) by mouth every 6 (six) hours as needed for mild pain.    Marland Kitchen aspirin EC 81 MG tablet Take 1 tablet (81 mg total) by mouth daily. 90 tablet 3  . HYDROcodone-acetaminophen (NORCO/VICODIN) 5-325 MG tablet Take 1 tablet by mouth every 6 (six) hours as needed for moderate pain. 20 tablet 0  . Insulin Glargine (LANTUS SOLOSTAR) 100 UNIT/ML Solostar Pen Inject 25 Units into the skin at bedtime. 5 pen 5  . Insulin Pen Needle (B-D UF III MINI PEN NEEDLES) 31G X 5 MM MISC Use as directed 100 each 2  . pregabalin (LYRICA) 100 MG capsule Take 1 capsule (100 mg total) by mouth 3 (three) times daily. 90 capsule 0  . albuterol (PROVENTIL HFA;VENTOLIN HFA) 108 (90 Base) MCG/ACT inhaler Inhale 2 puffs into the lungs every 6 (six) hours as needed for wheezing or shortness of breath. 54 Inhaler 3  . atorvastatin (LIPITOR) 80 MG tablet Take 1 tablet (80 mg total) by mouth daily. 90 tablet 3  . docusate sodium (COLACE) 100 MG capsule Take 1 capsule (100 mg total) by mouth daily. 30 capsule  0  . DULoxetine (CYMBALTA) 60 MG capsule Take 1 capsule (60 mg total) by mouth daily. 30 capsule 0  . Fluticasone-Salmeterol (ADVAIR) 100-50 MCG/DOSE AEPB Inhale 1 puff into the lungs 2 (two) times daily. 180 each 3  . lisinopril (PRINIVIL,ZESTRIL) 5 MG tablet Take 1 tablet (5 mg total) by mouth daily. 30 tablet 0  . metFORMIN (GLUCOPHAGE) 1000 MG tablet Take 0.5 tablets (500 mg total) by mouth 2 (two) times daily. 30 tablet 0  . metoprolol tartrate (LOPRESSOR) 25 MG tablet Take 0.5 tablets (12.5 mg total) by mouth 2 (two) times daily. 30 tablet 0  . oxybutynin (DITROPAN) 5 MG tablet Take 1 tablet (5 mg total) by mouth 2 (two) times daily. 60 tablet 0  . pantoprazole (PROTONIX) 20 MG tablet Take 1  tablet (20 mg total) by mouth daily. 30 tablet 1  . clopidogrel (PLAVIX) 75 MG tablet Take 1 tablet (75 mg total) by mouth daily with breakfast. (Patient not taking: Reported on 12/11/2016) 30 tablet 3   No facility-administered medications prior to visit.     ROS Review of Systems  Constitutional: Negative for activity change, appetite change and fatigue.  HENT: Negative for congestion, sinus pressure and sore throat.   Eyes: Negative for visual disturbance.  Respiratory: Negative for cough, chest tightness, shortness of breath and wheezing.   Cardiovascular: Negative for chest pain and palpitations.  Gastrointestinal: Negative for abdominal distention, abdominal pain and constipation.  Endocrine: Negative for polydipsia.  Genitourinary: Negative for dysuria and frequency.  Musculoskeletal: Negative for arthralgias and back pain.  Skin: Negative for rash.  Neurological: Negative for tremors, light-headedness and numbness.  Hematological: Does not bruise/bleed easily.  Psychiatric/Behavioral: Positive for dysphoric mood. Negative for agitation and behavioral problems.    Objective:  BP 132/76   Pulse 81   Temp 98.5 F (36.9 C) (Oral)   Ht 5\' 2"  (1.575 m)   Wt 128 lb 3.2 oz (58.2 kg)   SpO2 97%   BMI 23.45 kg/m   BP/Weight 12/11/2016 12/05/2016 11/25/2016  Systolic BP 132 128 -  Diastolic BP 76 60 -  Wt. (Lbs) 128.2 - 121.6  BMI 23.45 - 21.54      Physical Exam Constitutional: normal appearing,  Eyes: PERRLA HEENT: Head is atraumatic, normal sinuses, normal oropharynx, normal appearing tonsils and palate, tympanic membrane is normal bilaterally. Neck: normal range of motion, no thyromegaly, no JVD Cardiovascular: normal rate and rhythm, normal heart sounds, no murmurs, rub or gallop, unable to palpate dorsalis pedis bilaterally  respiratory: clear to auscultation bilaterally, no wheezes, no rales, no rhonchi Abdomen: soft, not tender to palpation, normal bowel sounds,  no enlarged organs Extremities: Full ROM, no tenderness in joints; left stump, sutures in place.  Lateral aspect of incision with open wound,  minimal serous drainage. Skin: warm and dry, no lesions. Neurological: alert, oriented x3, cranial nerves I-XII grossly intact , normal motor strength, normal sensation. Psychological: dysphoric mood.   Lab Results  Component Value Date   HGBA1C 7.2 (H) 11/19/2016    Assessment & Plan:   1. Diabetes mellitus without complication (HCC) Controlled with A1c of 7.2 Continue current regimen, diabetic diet - POCT glucose (manual entry) - metFORMIN (GLUCOPHAGE) 500 MG tablet; Take 1 tablet (500 mg total) by mouth 2 (two) times daily.  Dispense: 60 tablet; Refill: 6  2. Essential hypertension Controlled Low-sodium, DASH diet - lisinopril (PRINIVIL,ZESTRIL) 5 MG tablet; Take 1 tablet (5 mg total) by mouth daily.  Dispense: 30 tablet; Refill: 6 -  metoprolol tartrate (LOPRESSOR) 25 MG tablet; Take 0.5 tablets (12.5 mg total) by mouth 2 (two) times daily.  Dispense: 30 tablet; Refill: 6  3. Overactive bladder Stable - oxybutynin (DITROPAN) 5 MG tablet; Take 1 tablet (5 mg total) by mouth 2 (two) times daily.  Dispense: 60 tablet; Refill: 6  4. Gastroesophageal reflux disease without esophagitis Controlled - pantoprazole (PROTONIX) 20 MG tablet; Take 1 tablet (20 mg total) by mouth daily.  Dispense: 30 tablet; Refill: 5  5. S/P transmetatarsal amputation of foot, left (HCC) Stump is healing well Dressing change performed in the clinic Follow-up with vascular- Dr Randie Heinz  6. Adjustment reaction with anxiety and depression Due to recent amputation LCSW called in for counseling - DULoxetine (CYMBALTA) 60 MG capsule; Take 1 capsule (60 mg total) by mouth daily.  Dispense: 30 capsule; Refill: 6 - Fluticasone-Salmeterol (ADVAIR) 100-50 MCG/DOSE AEPB; Inhale 1 puff into the lungs 2 (two) times daily.  Dispense: 180 each; Refill: 1  7. PAD (peripheral  artery disease) (HCC) Risk factor modification - clopidogrel (PLAVIX) 75 MG tablet; Take 1 tablet (75 mg total) by mouth daily with breakfast.  Dispense: 30 tablet; Refill: 6 - atorvastatin (LIPITOR) 80 MG tablet; Take 1 tablet (80 mg total) by mouth daily.  Dispense: 90 tablet; Refill: 1  8. Mild intermittent asthma without complication Controlled - albuterol (PROVENTIL HFA;VENTOLIN HFA) 108 (90 Base) MCG/ACT inhaler; Inhale 2 puffs into the lungs every 6 (six) hours as needed for wheezing or shortness of breath.  Dispense: 54 Inhaler; Refill: 3   Meds ordered this encounter  Medications  . clopidogrel (PLAVIX) 75 MG tablet    Sig: Take 1 tablet (75 mg total) by mouth daily with breakfast.    Dispense:  30 tablet    Refill:  6  . atorvastatin (LIPITOR) 80 MG tablet    Sig: Take 1 tablet (80 mg total) by mouth daily.    Dispense:  90 tablet    Refill:  1  . albuterol (PROVENTIL HFA;VENTOLIN HFA) 108 (90 Base) MCG/ACT inhaler    Sig: Inhale 2 puffs into the lungs every 6 (six) hours as needed for wheezing or shortness of breath.    Dispense:  54 Inhaler    Refill:  3  . DULoxetine (CYMBALTA) 60 MG capsule    Sig: Take 1 capsule (60 mg total) by mouth daily.    Dispense:  30 capsule    Refill:  6    Discontinue Celexa  . Fluticasone-Salmeterol (ADVAIR) 100-50 MCG/DOSE AEPB    Sig: Inhale 1 puff into the lungs 2 (two) times daily.    Dispense:  180 each    Refill:  1  . docusate sodium (COLACE) 100 MG capsule    Sig: Take 1 capsule (100 mg total) by mouth daily.    Dispense:  30 capsule    Refill:  1  . lisinopril (PRINIVIL,ZESTRIL) 5 MG tablet    Sig: Take 1 tablet (5 mg total) by mouth daily.    Dispense:  30 tablet    Refill:  6    Discontinue previous dose  . metFORMIN (GLUCOPHAGE) 500 MG tablet    Sig: Take 1 tablet (500 mg total) by mouth 2 (two) times daily.    Dispense:  60 tablet    Refill:  6  . metoprolol tartrate (LOPRESSOR) 25 MG tablet    Sig: Take 0.5  tablets (12.5 mg total) by mouth 2 (two) times daily.    Dispense:  30 tablet  Refill:  6  . oxybutynin (DITROPAN) 5 MG tablet    Sig: Take 1 tablet (5 mg total) by mouth 2 (two) times daily.    Dispense:  60 tablet    Refill:  6  . pantoprazole (PROTONIX) 20 MG tablet    Sig: Take 1 tablet (20 mg total) by mouth daily.    Dispense:  30 tablet    Refill:  5    Follow-up: Return in about 1 month (around 01/10/2017) for Follow-up on diabetes and left foot amputation.   Jaclyn Shaggy MD

## 2016-12-11 NOTE — BH Specialist Note (Signed)
Integrated Behavioral Health Follow Up Visit  MRN: [id number] Name: Andrea [name]   Session Start time: 3:05 PM Session End time: 3:35 PM Total time: 30 minutes Number of Integrated Behavioral Health Clinician visits: 3/6  Type of Service: Integrated Behavioral Health- Individual/Family Interpretor:No. Interpretor Name and Language: N/A   Warm Hand Off Completed.       SUBJECTIVE: Andrea Brookingoni B Gerling is a 66 y.o. female accompanied by patient. Patient was referred by Dr. Venetia NightAmao for depression. Patient reports the following symptoms/concerns: overwhelming feelings of sadness and worry, difficulty sleeping, low energy, racing thoughts, inability to concentrate, and withdrawn behavior Duration of problem: Ongoing Pt reports being diagnosed with Depression and Anxiety ten years ago; Severity of problem: severe  OBJECTIVE: Mood: Dysphoric and Affect: Tearful Risk of harm to self or others: No plan to harm self or others   LIFE CONTEXT: Family and Social: Pt resides alone and receives support from sister who resides nearby. She has a strained relationship with her adult children.  School/Work: Pt receives Set designerprivate insurance and social security. She has applied for disability and is pending a hearing date. Self-Care: Pt manages health by complying with medication management for her diabetes. She has adopted a healthy diet and tries to walk when she is not experiencing pain Life Changes: Pt has ongoing medical concerns. She was recently hospitalized and completed rehab from left foot transmetatarsal amputation.   GOALS ADDRESSED: Patient will reduce symptoms of: depression and increase knowledge and/or ability of: coping skills and also: Increase healthy adjustment to current life circumstances and Increase adequate support systems for patient/family  INTERVENTIONS: Solution-Focused Strategies, Supportive Counseling and Link to [facility] [employer] Standardized Assessments completed:  Patient declined screening  ASSESSMENT: Pt currently experiencing depression triggered by ongoing medical concerns that resulted in a recent left foot transmetatarsal amputation on 11/19/16. She reports overwhelming feelings of sadness and worry, difficulty sleeping, low energy, racing thoughts, inability to concentrate, and withdrawn behavior.   Pt may benefit from psychotherapy. She is participating in medication management through PCP. Pt shared that she is "feeling better" however experiences crying spells when she looks at her foot. She is receiving support from her sister and reported that her adult children visited her in the hospital once. LCSWA discussed interventions (I.e. Gratitude journaling) to assist in promoting positive emotions. LCSWA referred pt to [employer] to assist with food insecurity and education on healthy food choices.    PLAN: 1. Follow up with behavioral health clinician on : Pt was encouraged tocontact LCSWA if symptoms worsen or fail to improveto schedule [facility]. 2. Behavioral recommendations: LCSWA recommends that pt apply healthy coping skills discussed and utilize resources as needed. Pt is encouraged to schedule follow up appointment with LCSWA 3. Referral(s): Community Resources:  Food 4. "From scale of 1-10, how likely are you to follow plan?": 7/10  Bridgett LarssonJasmine D Lewis, LCSW 12/14/16 5:10 PM

## 2016-12-17 ENCOUNTER — Telehealth: Payer: Self-pay | Admitting: Family Medicine

## 2016-12-17 NOTE — Telephone Encounter (Signed)
Call placed to patient #[phone], to check on her status. Spoke with patient and she informed me that she is doing good. Patient lives alone and she is caring for her own wound. Sister and nurse also help her.   In our conversation patient stated that she called [facility] but she prefers her sister to take her places.

## 2016-12-18 ENCOUNTER — Ambulatory Visit (INDEPENDENT_AMBULATORY_CARE_PROVIDER_SITE_OTHER): Payer: BLUE CROSS/BLUE SHIELD | Admitting: Vascular Surgery

## 2016-12-18 ENCOUNTER — Other Ambulatory Visit: Payer: Self-pay

## 2016-12-18 ENCOUNTER — Encounter: Payer: Self-pay | Admitting: Vascular Surgery

## 2016-12-18 VITALS — BP 134/64 | HR 72 | Temp 98.3°F | Resp 18 | Ht 62.0 in | Wt 135.0 lb

## 2016-12-18 DIAGNOSIS — T8189XD Other complications of procedures, not elsewhere classified, subsequent encounter: Secondary | ICD-10-CM

## 2016-12-18 DIAGNOSIS — Z95828 Presence of other vascular implants and grafts: Secondary | ICD-10-CM

## 2016-12-19 NOTE — Progress Notes (Signed)
Subjective:     Patient ID: Elizebeth Brookingoni B Mcmonigle, female   DOB: 29-Dec-1950, 66 y.o.   MRN: [id number]  HPI 66yo female f/u from recent left tma. She has left leg edema but no pain. Minimal breakdown of incision without drainage or fevers/chills. Currently at home, not smoking   Review of Systems Left tma wound    Objective:   Physical Exam aaox3 Palpable dp on left 2 x 2cm area mid left tma of superficial wound, sutures in place    Assessment/plan     66yo female s/p left tma with small area of superficial breakdown. Santyl sent to pharmacy. A few sutures were removed and we will the rest in 3 more weeks. Counseled to elevate leg while recumbent.   Grainne Knights C. Randie Heinzain, MD [facility] Office: [phone] Pager: [phone]

## 2016-12-22 ENCOUNTER — Telehealth: Payer: Self-pay

## 2016-12-22 NOTE — Telephone Encounter (Signed)
Returned call to Ms.Spoerl after speaking with Noreene LarssonJill, [facility]. Noreene LarssonJill reported that pt was not able to afford her Rx for Santyl that was prescribed by Dr.Cain. Also reported that pt was experiencing SOB while she was in the home but left after she used her inhaler and is unaware if she is still experiencing this due to having to leave because the therapist came. Explained that I would speak with a nurse to see if there was a comparable medication that was cheaper. After speaking with the pt she stated that the pharmacy indicated that they would need to speak to Dr. Randie Heinzain about the Rx. After asking pt if she was still experiencing SOB, she stated "yes and it had not got any better" Pt stated that this had been going on since yesterday and is not normal for her. Advised pt to go to the nearest ER or call 911. Pt indicated that she had not transportation and she probably would not go. Let pt know that I would follow up with her on tomorrow about the pharmacies request. Pt verbalized understanding and agrees with this plan.

## 2016-12-25 ENCOUNTER — Telehealth: Payer: Self-pay | Admitting: Family Medicine

## 2016-12-25 ENCOUNTER — Other Ambulatory Visit: Payer: Self-pay | Admitting: Family Medicine

## 2016-12-25 MED ORDER — HYDROCORTISONE 0.5 % EX CREA: 1.0000 "application " | TOPICAL_CREAM | Freq: Two times a day (BID) | CUTANEOUS | 0 refills | Status: AC

## 2016-12-25 NOTE — Telephone Encounter (Signed)
I have sent a prescription for hydrocortisone cream to her pharmacy.

## 2016-12-25 NOTE — Telephone Encounter (Signed)
Call placed to patient #[phone], to check on her status. Patient stated that she is doing good and taking her medications as prescribed, and was happy that the insurance approved a medication (for her foot) that specialist had called in for her.   During our conversation patient stated that she had mentioned to provider in her last visit that she had gotten spots on her leg that were itching her. Patient thought it may have been a reaction from the gauze she was using but it's not. Itching stops once patient scratches. Patient would like a medication (cream) to be called in to our pharmacy [facility] Informed patient that I would notify provider.  Routing message to provider.

## 2016-12-25 NOTE — Telephone Encounter (Signed)
Call placed to patient to inform her that provider received the message and sent prescription (hydrocortisone cream) to our pharmacy. Informed patient that medication will be ready for pick up Monday afternoon if not Tuesday. Patient understood and had no further questions.

## 2016-12-30 ENCOUNTER — Emergency Department (HOSPITAL_COMMUNITY): Payer: BLUE CROSS/BLUE SHIELD

## 2016-12-30 ENCOUNTER — Observation Stay (HOSPITAL_COMMUNITY)
Admission: EM | Admit: 2016-12-30 | Discharge: 2017-01-01 | Disposition: A | Discharge status: Home / Self Care | Payer: BLUE CROSS/BLUE SHIELD | Attending: Internal Medicine | Admitting: Internal Medicine

## 2016-12-30 ENCOUNTER — Encounter (HOSPITAL_COMMUNITY): Payer: Self-pay | Admitting: *Deleted

## 2016-12-30 ENCOUNTER — Other Ambulatory Visit: Payer: Self-pay

## 2016-12-30 DIAGNOSIS — R0602 Shortness of breath: Secondary | ICD-10-CM

## 2016-12-30 DIAGNOSIS — J449 Chronic obstructive pulmonary disease, unspecified: Secondary | ICD-10-CM | POA: Insufficient documentation

## 2016-12-30 DIAGNOSIS — E7800 Pure hypercholesterolemia, unspecified: Secondary | ICD-10-CM | POA: Insufficient documentation

## 2016-12-30 DIAGNOSIS — I25709 Atherosclerosis of coronary artery bypass graft(s), unspecified, with unspecified angina pectoris: Secondary | ICD-10-CM | POA: Diagnosis not present

## 2016-12-30 DIAGNOSIS — R3915 Urgency of urination: Secondary | ICD-10-CM

## 2016-12-30 DIAGNOSIS — Z87891 Personal history of nicotine dependence: Secondary | ICD-10-CM | POA: Diagnosis not present

## 2016-12-30 DIAGNOSIS — I998 Other disorder of circulatory system: Secondary | ICD-10-CM | POA: Diagnosis not present

## 2016-12-30 DIAGNOSIS — G8929 Other chronic pain: Secondary | ICD-10-CM | POA: Diagnosis not present

## 2016-12-30 DIAGNOSIS — M545 Low back pain, unspecified: Secondary | ICD-10-CM | POA: Diagnosis present

## 2016-12-30 DIAGNOSIS — Z7984 Long term (current) use of oral hypoglycemic drugs: Secondary | ICD-10-CM | POA: Insufficient documentation

## 2016-12-30 DIAGNOSIS — Z89432 Acquired absence of left foot: Secondary | ICD-10-CM | POA: Insufficient documentation

## 2016-12-30 DIAGNOSIS — Z885 Allergy status to narcotic agent status: Secondary | ICD-10-CM | POA: Insufficient documentation

## 2016-12-30 DIAGNOSIS — Z886 Allergy status to analgesic agent status: Secondary | ICD-10-CM | POA: Insufficient documentation

## 2016-12-30 DIAGNOSIS — R350 Frequency of micturition: Secondary | ICD-10-CM

## 2016-12-30 DIAGNOSIS — I5030 Unspecified diastolic (congestive) heart failure: Secondary | ICD-10-CM | POA: Diagnosis present

## 2016-12-30 DIAGNOSIS — I2510 Atherosclerotic heart disease of native coronary artery without angina pectoris: Secondary | ICD-10-CM | POA: Insufficient documentation

## 2016-12-30 DIAGNOSIS — M1990 Unspecified osteoarthritis, unspecified site: Secondary | ICD-10-CM | POA: Insufficient documentation

## 2016-12-30 DIAGNOSIS — E785 Hyperlipidemia, unspecified: Secondary | ICD-10-CM | POA: Diagnosis not present

## 2016-12-30 DIAGNOSIS — Z79899 Other long term (current) drug therapy: Secondary | ICD-10-CM | POA: Diagnosis not present

## 2016-12-30 DIAGNOSIS — I5031 Acute diastolic (congestive) heart failure: Secondary | ICD-10-CM

## 2016-12-30 DIAGNOSIS — F17211 Nicotine dependence, cigarettes, in remission: Secondary | ICD-10-CM | POA: Diagnosis not present

## 2016-12-30 DIAGNOSIS — Z66 Do not resuscitate: Secondary | ICD-10-CM | POA: Insufficient documentation

## 2016-12-30 DIAGNOSIS — I110 Hypertensive heart disease with heart failure: Principal | ICD-10-CM | POA: Insufficient documentation

## 2016-12-30 DIAGNOSIS — K219 Gastro-esophageal reflux disease without esophagitis: Secondary | ICD-10-CM | POA: Insufficient documentation

## 2016-12-30 DIAGNOSIS — M797 Fibromyalgia: Secondary | ICD-10-CM | POA: Insufficient documentation

## 2016-12-30 DIAGNOSIS — R8271 Bacteriuria: Secondary | ICD-10-CM | POA: Diagnosis not present

## 2016-12-30 DIAGNOSIS — I5033 Acute on chronic diastolic (congestive) heart failure: Secondary | ICD-10-CM | POA: Diagnosis not present

## 2016-12-30 DIAGNOSIS — Z7902 Long term (current) use of antithrombotics/antiplatelets: Secondary | ICD-10-CM | POA: Diagnosis not present

## 2016-12-30 DIAGNOSIS — E1142 Type 2 diabetes mellitus with diabetic polyneuropathy: Secondary | ICD-10-CM | POA: Diagnosis present

## 2016-12-30 DIAGNOSIS — E1140 Type 2 diabetes mellitus with diabetic neuropathy, unspecified: Secondary | ICD-10-CM | POA: Diagnosis present

## 2016-12-30 DIAGNOSIS — Z7982 Long term (current) use of aspirin: Secondary | ICD-10-CM | POA: Diagnosis not present

## 2016-12-30 DIAGNOSIS — E1151 Type 2 diabetes mellitus with diabetic peripheral angiopathy without gangrene: Secondary | ICD-10-CM | POA: Insufficient documentation

## 2016-12-30 DIAGNOSIS — Z951 Presence of aortocoronary bypass graft: Secondary | ICD-10-CM | POA: Diagnosis not present

## 2016-12-30 DIAGNOSIS — I739 Peripheral vascular disease, unspecified: Secondary | ICD-10-CM | POA: Diagnosis not present

## 2016-12-30 DIAGNOSIS — K5900 Constipation, unspecified: Secondary | ICD-10-CM

## 2016-12-30 DIAGNOSIS — I10 Essential (primary) hypertension: Secondary | ICD-10-CM | POA: Diagnosis present

## 2016-12-30 DIAGNOSIS — Z794 Long term (current) use of insulin: Secondary | ICD-10-CM | POA: Insufficient documentation

## 2016-12-30 LAB — CBC
HCT: 29.6 % — ABNORMAL LOW (ref 36.0–46.0)
HEMOGLOBIN: 8.8 g/dL — AB (ref 12.0–15.0)
MCH: 28.4 pg (ref 26.0–34.0)
MCHC: 29.7 g/dL — AB (ref 30.0–36.0)
MCV: 95.5 fL (ref 78.0–100.0)
PLATELETS: 247 10*3/uL (ref 150–400)
RBC: 3.1 MIL/uL — AB (ref 3.87–5.11)
RDW: 15.8 % — ABNORMAL HIGH (ref 11.5–15.5)
WBC: 10.7 10*3/uL — ABNORMAL HIGH (ref 4.0–10.5)

## 2016-12-30 LAB — BASIC METABOLIC PANEL
ANION GAP: 9 (ref 5–15)
BUN: 20 mg/dL (ref 6–20)
CALCIUM: 8.8 mg/dL — AB (ref 8.9–10.3)
CO2: 24 mmol/L (ref 22–32)
CREATININE: 0.79 mg/dL (ref 0.44–1.00)
Chloride: 105 mmol/L (ref 101–111)
GLUCOSE: 275 mg/dL — AB (ref 65–99)
Potassium: 3.9 mmol/L (ref 3.5–5.1)
Sodium: 138 mmol/L (ref 135–145)

## 2016-12-30 LAB — I-STAT TROPONIN, ED: Troponin i, poc: 0.03 ng/mL (ref 0.00–0.08)

## 2016-12-30 LAB — BRAIN NATRIURETIC PEPTIDE: B NATRIURETIC PEPTIDE 5: 685 pg/mL — AB (ref 0.0–100.0)

## 2016-12-30 LAB — CBG MONITORING, ED: Glucose-Capillary: 175 mg/dL — ABNORMAL HIGH (ref 65–99)

## 2016-12-30 LAB — GLUCOSE, CAPILLARY: GLUCOSE-CAPILLARY: 187 mg/dL — AB (ref 65–99)

## 2016-12-30 LAB — MAGNESIUM: Magnesium: 1.8 mg/dL (ref 1.7–2.4)

## 2016-12-30 MED ORDER — INSULIN ASPART 100 UNIT/ML ~~LOC~~ SOLN
0.0000 [IU] | Freq: Three times a day (TID) | SUBCUTANEOUS | Status: DC
  Administered 2016-12-30: 2 [IU] via SUBCUTANEOUS
  Administered 2016-12-31: 5 [IU] via SUBCUTANEOUS
  Administered 2016-12-31 (×2): 2 [IU] via SUBCUTANEOUS
  Administered 2017-01-01: 1 [IU] via SUBCUTANEOUS
  Administered 2017-01-01: 5 [IU] via SUBCUTANEOUS
  Administered 2017-01-01: 1 [IU] via SUBCUTANEOUS

## 2016-12-30 MED ORDER — HYDROCORTISONE 0.5 % EX CREA
1.0000 "application " | TOPICAL_CREAM | Freq: Two times a day (BID) | CUTANEOUS | Status: DC
  Administered 2016-12-30 – 2017-01-01 (×3): 1 via TOPICAL
  Filled 2016-12-30 (×2): qty 28.35

## 2016-12-30 MED ORDER — DULOXETINE HCL 60 MG PO CPEP
60.0000 mg | ORAL_CAPSULE | Freq: Every day | ORAL | Status: DC
  Administered 2016-12-31 – 2017-01-01 (×2): 60 mg via ORAL
  Filled 2016-12-30 (×2): qty 1

## 2016-12-30 MED ORDER — ALBUTEROL SULFATE (2.5 MG/3ML) 0.083% IN NEBU
5.0000 mg | INHALATION_SOLUTION | Freq: Once | RESPIRATORY_TRACT | Status: AC
  Administered 2016-12-30: 5 mg via RESPIRATORY_TRACT

## 2016-12-30 MED ORDER — LISINOPRIL 5 MG PO TABS
5.0000 mg | ORAL_TABLET | Freq: Every day | ORAL | Status: DC
  Administered 2016-12-30 – 2017-01-01 (×2): 5 mg via ORAL
  Filled 2016-12-30 (×3): qty 1

## 2016-12-30 MED ORDER — INSULIN GLARGINE 100 UNIT/ML ~~LOC~~ SOLN
15.0000 [IU] | Freq: Every day | SUBCUTANEOUS | Status: DC
  Administered 2016-12-30 – 2016-12-31 (×2): 15 [IU] via SUBCUTANEOUS
  Filled 2016-12-30 (×3): qty 0.15

## 2016-12-30 MED ORDER — ALBUTEROL SULFATE (2.5 MG/3ML) 0.083% IN NEBU: 2.5000 mg | INHALATION_SOLUTION | RESPIRATORY_TRACT | Status: DC | PRN

## 2016-12-30 MED ORDER — ATORVASTATIN CALCIUM 80 MG PO TABS
80.0000 mg | ORAL_TABLET | Freq: Every day | ORAL | Status: DC
  Administered 2016-12-30 – 2017-01-01 (×3): 80 mg via ORAL
  Filled 2016-12-30 (×3): qty 1

## 2016-12-30 MED ORDER — FUROSEMIDE 10 MG/ML IJ SOLN
40.0000 mg | INTRAMUSCULAR | Status: DC
  Administered 2016-12-30: 40 mg via INTRAVENOUS
  Filled 2016-12-30 (×2): qty 4

## 2016-12-30 MED ORDER — IPRATROPIUM-ALBUTEROL 0.5-2.5 (3) MG/3ML IN SOLN
3.0000 mL | Freq: Once | RESPIRATORY_TRACT | Status: AC
  Administered 2016-12-30: 3 mL via RESPIRATORY_TRACT
  Filled 2016-12-30: qty 3

## 2016-12-30 MED ORDER — METOPROLOL TARTRATE 12.5 MG HALF TABLET
12.5000 mg | ORAL_TABLET | Freq: Two times a day (BID) | ORAL | Status: DC
  Administered 2016-12-30 – 2017-01-01 (×5): 12.5 mg via ORAL
  Filled 2016-12-30 (×5): qty 1

## 2016-12-30 MED ORDER — CLOPIDOGREL BISULFATE 75 MG PO TABS
75.0000 mg | ORAL_TABLET | Freq: Every day | ORAL | Status: DC
  Administered 2016-12-31 – 2017-01-01 (×2): 75 mg via ORAL
  Filled 2016-12-30 (×2): qty 1

## 2016-12-30 MED ORDER — ACETAMINOPHEN 325 MG PO TABS: 650.0000 mg | ORAL_TABLET | Freq: Four times a day (QID) | ORAL | Status: DC | PRN

## 2016-12-30 MED ORDER — PREGABALIN 100 MG PO CAPS
100.0000 mg | ORAL_CAPSULE | Freq: Three times a day (TID) | ORAL | Status: DC
  Administered 2016-12-30 – 2017-01-01 (×7): 100 mg via ORAL
  Filled 2016-12-30 (×4): qty 1
  Filled 2016-12-30: qty 2
  Filled 2016-12-30 (×2): qty 1

## 2016-12-30 MED ORDER — HYDROCODONE-ACETAMINOPHEN 5-325 MG PO TABS: 1.0000 | ORAL_TABLET | Freq: Four times a day (QID) | ORAL | Status: DC | PRN

## 2016-12-30 MED ORDER — OXYBUTYNIN CHLORIDE 5 MG PO TABS
5.0000 mg | ORAL_TABLET | Freq: Two times a day (BID) | ORAL | Status: DC
  Administered 2016-12-30 – 2017-01-01 (×5): 5 mg via ORAL
  Filled 2016-12-30 (×6): qty 1

## 2016-12-30 MED ORDER — ASPIRIN EC 81 MG PO TBEC
81.0000 mg | DELAYED_RELEASE_TABLET | Freq: Every day | ORAL | Status: DC
  Administered 2016-12-31 – 2017-01-01 (×2): 81 mg via ORAL
  Filled 2016-12-30 (×2): qty 1

## 2016-12-30 MED ORDER — MOMETASONE FURO-FORMOTEROL FUM 100-5 MCG/ACT IN AERO
2.0000 | INHALATION_SPRAY | Freq: Two times a day (BID) | RESPIRATORY_TRACT | Status: DC
  Administered 2016-12-31 – 2017-01-01 (×3): 2 via RESPIRATORY_TRACT
  Filled 2016-12-30: qty 8.8

## 2016-12-30 MED ORDER — PANTOPRAZOLE SODIUM 20 MG PO TBEC
20.0000 mg | DELAYED_RELEASE_TABLET | Freq: Every day | ORAL | Status: DC
  Administered 2016-12-30 – 2017-01-01 (×3): 20 mg via ORAL
  Filled 2016-12-30 (×3): qty 1

## 2016-12-30 MED ORDER — FUROSEMIDE 10 MG/ML IJ SOLN
40.0000 mg | Freq: Once | INTRAMUSCULAR | Status: AC
  Administered 2016-12-30: 40 mg via INTRAVENOUS
  Filled 2016-12-30: qty 4

## 2016-12-30 MED ORDER — DOCUSATE SODIUM 100 MG PO CAPS
100.0000 mg | ORAL_CAPSULE | Freq: Every day | ORAL | Status: DC
Start: 2016-12-31 — End: 2017-01-01
  Administered 2016-12-31 – 2017-01-01 (×2): 100 mg via ORAL
  Filled 2016-12-30 (×2): qty 1

## 2016-12-30 MED ORDER — ALBUTEROL SULFATE (2.5 MG/3ML) 0.083% IN NEBU
INHALATION_SOLUTION | RESPIRATORY_TRACT | Status: AC
  Filled 2016-12-30: qty 6

## 2016-12-30 MED ORDER — ENOXAPARIN SODIUM 40 MG/0.4ML ~~LOC~~ SOLN
40.0000 mg | SUBCUTANEOUS | Status: DC
  Administered 2016-12-30 – 2017-01-01 (×3): 40 mg via SUBCUTANEOUS
  Filled 2016-12-30 (×3): qty 0.4

## 2016-12-30 NOTE — H&P (Signed)
Date: 12/30/2016               Patient Name:  Andrea [name] MRN: [id number]  DOB: Jan 24, 1950 Age / Sex: 67 y.o., female   PCP: Jaclyn Shaggy, MD         Medical Service: Internal Medicine Teaching Service         Attending Physician: Dr. Inez Catalina, MD    First Contact: Dr. Renaldo Reel Pager: [phone]  Second Contact: Dr. Mikey Bussing Pager: [phone]       After Hours (After 5p/  First Contact Pager: [phone]  weekends / holidays): Second Contact Pager: [phone]   Chief Complaint: Shortness of breath  History of Present Illness: Ms. Scalf is a 67 yo woman with a reported history of asthma, COPD,  CAD s/p 3v CABG, diabetes with neuropathy, recent transmetatarsal amputation of the left foot who presented to the ED due to worsening shortness of breath. She noticed some shortness of breath with minimal activity and worsening dyspnea lying flat for about a week, but then symptoms getting worse around 6pm yesterday evening even at rest. She stayed at home using her albuterol without significant benefit until about 2am then came to the ED for persistent symptoms. She was not hypoxic but demonstrated increased WOB at rest and with conversation needing evaluation and management before returning home.  She has had bilateral lower extremity swelling since getting home from skilled nursing rehab around the start of December that has also started to break out with itching rash for this past week. Her diet is unchanged although she has constipation requiring daily stool softeners. She has numbness of her distal feet and also complains of abnormal sensation in the 4th and 5th digits of both hands. She has frequent and urgent urination without dysuria and is wearing adult diapers due to this. She denies any chest pain, headache, vision blurring, LOC.  Meds:  Current Meds  Medication Sig  . acetaminophen (TYLENOL) 325 MG tablet Take 2 tablets (650 mg total) by mouth every 6 (six) hours as needed for  mild pain.  Marland Kitchen albuterol (PROVENTIL HFA;VENTOLIN HFA) 108 (90 Base) MCG/ACT inhaler Inhale 2 puffs into the lungs every 6 (six) hours as needed for wheezing or shortness of breath.  Marland Kitchen aspirin EC 81 MG tablet Take 1 tablet (81 mg total) by mouth daily.  Marland Kitchen atorvastatin (LIPITOR) 80 MG tablet Take 1 tablet (80 mg total) by mouth daily.  . clopidogrel (PLAVIX) 75 MG tablet Take 1 tablet (75 mg total) by mouth daily with breakfast.  . docusate sodium (COLACE) 100 MG capsule Take 1 capsule (100 mg total) by mouth daily.  . DULoxetine (CYMBALTA) 60 MG capsule Take 1 capsule (60 mg total) by mouth daily.  . Fluticasone-Salmeterol (ADVAIR) 100-50 MCG/DOSE AEPB Inhale 1 puff into the lungs 2 (two) times daily.  Marland Kitchen HYDROcodone-acetaminophen (NORCO/VICODIN) 5-325 MG tablet Take 1 tablet by mouth every 6 (six) hours as needed for moderate pain.  . hydrocortisone cream 0.5 % Apply 1 application topically 2 (two) times daily.  . Insulin Glargine (LANTUS SOLOSTAR) 100 UNIT/ML Solostar Pen Inject 25 Units into the skin at bedtime.  Marland Kitchen lisinopril (PRINIVIL,ZESTRIL) 5 MG tablet Take 1 tablet (5 mg total) by mouth daily.  . metFORMIN (GLUCOPHAGE) 500 MG tablet Take 1 tablet (500 mg total) by mouth 2 (two) times daily.  . metoprolol tartrate (LOPRESSOR) 25 MG tablet Take 0.5 tablets (12.5 mg total) by mouth 2 (two) times daily.  Marland Kitchen oxybutynin (DITROPAN)  5 MG tablet Take 1 tablet (5 mg total) by mouth 2 (two) times daily.  . pantoprazole (PROTONIX) 20 MG tablet Take 1 tablet (20 mg total) by mouth daily.  . pregabalin (LYRICA) 100 MG capsule Take 1 capsule (100 mg total) by mouth 3 (three) times daily.  Marland Kitchen. SANTYL ointment Apply 1 application topically daily.     Allergies: Allergies as of 12/30/2016 - Review Complete 12/30/2016  Allergen Reaction Noted  . Naproxen Itching   . Tramadol hcl Itching    Past Medical History:  Diagnosis Date  . Arthritis   . Asthma   . Coronary artery disease   . Diabetes mellitus  without complication (HCC)   . Dyspnea   . Fibromyalgia   . GERD (gastroesophageal reflux disease)   . High cholesterol   . Hypertension   . 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 PONV (postoperative nausea and vomiting)   . PVD (peripheral vascular disease) (HCC)   . Urinary frequency   . Urinary incontinence    Family History:  Family History  Problem Relation Age of Onset  . Stroke Mother    Social History: Tobacco Use  . Smoking status: Former Smoker    Packs/day: 0.25    Types: Cigarettes    Last attempt to quit: 04/04/2016    Years since quitting: 0.7  . Smokeless tobacco: Never Used  Substance and Sexual Activity  . Alcohol use: No  . Drug use: No  . Sexual activity: None   Review of Systems: A complete ROS was negative except as per HPI.   Physical Exam: Blood pressure (!) 161/63, pulse 81, temperature 99 F (37.2 C), temperature source Oral, resp. rate 17, SpO2 100 %. GENERAL- alert, co-operative, NAD HEENT- Oral mucosa appears moist, no cervical LN enlargement CARDIAC- RRR, no murmurs, rubs or gallops. RESP- Bibasilar inspiratory crackles, good air movement. NEURO- Decreased sensation in 4th and 5th digits of b/l hands, right toes, strength is 5/5, some interosseus muscle wasting EXTREMITIES- 2+ pitting edema of both legs below the knee, small scattered faintly erythematous pruritic spots overlying the edema SKIN- Warm, dry, without rash except as described on distal legs PSYCH- Repeatedly tearful during exam   EKG: personally reviewed my interpretation is ST segment depressions in lateral precordial leads not significant changed from 06/2016 tracings.  CXR: personally reviewed my interpretation is prominent interstitial markings increased compared to portable CXR 05/22/2016, no focal consolidations, very small left pleural effusion present  Assessment & Plan by Problem: Heart failure with preserved ejection fraction Her story is suggestive of  increasing peripheral edema, dyspnea on exertion, and PND now progressed to pulmonary edema. BNP is elevated to 685 (no known baseline), bibasilar crackles on exam and CXR is consistent with pulmonary edema. She did not have significant heart failure or reduced LVEF on TTE from April so I am unsure what precipitated this worsening. -Lasix 40mg  IV BID -Admit to telemetry x24hrs -Daily Bmet monitoring, Mg x1 -Urinalysis -Challenge with ambulation tomorrow  PAD and associated L foot transmetatarsal amputation Coronary artery disease involving coronary bypass graft of native heart -Continuing DAPT with ASA and plavix. -Continuing low dose hydrocodone q6hrs PRN for pain -Consider transfusion if needed to maintain Hgb >8.0 with heart disease and possible heart failure admission.  Type 2 diabetes mellitus with peripheral neuropathy (HCC) Home regimen is metformin and lantus 25U qHS -Hold metformin -lantus insulin 15U qHS -SSI-S -TIDAC CBGs  Diabetic Neuropathy I suspect the foot numbness is  diabetic neuropathy. The finger numbness follows the ulnar nerve distribution although I see no proximal lesions to explain this. -Continuing duloxetine for neuropathy and mood.  HTN BP 113/83-161/63 so far this admission. -Continue lisinopril 5mg , metoprolol 12.5mg  BID -Consider increasing doses or short acting 3rd medicine if she remains highly elevated  Hyperlipidemia Continuing home atorvastatin 80mg   DNR Diet: HH/carb mod VTE ppx: Dent enoxaparin  Dispo: Admit patient to Observation with expected length of stay less than 2 midnights.  Signed: Fuller Planhristopher W Vernis Eid, MD PGY-III Internal Medicine Resident Pager# [phone] 12/30/2016, 12:25 PM

## 2016-12-30 NOTE — ED Provider Notes (Signed)
[facility] EMERGENCY DEPARTMENT Provider Note   CSN: [id number] Arrival date & time: 12/30/16  0308     History   Chief Complaint Chief Complaint  Patient presents with  . Shortness of Breath    HPI Andrea [name] is a 66 y.o. female with history of CAD with triple CABG, diabetes, COPD, recent partial amputation of the left foot who presents with a 1-week history of shortness of breath.  She has symptoms at rest, however she has worsening symptoms with ambulation or any activity.  She has had some associated lower extremity edema over the past few weeks.  She denies any chest pain.  She denies any cough.  She has taken her inhalers at home for COPD which have not improved her symptoms today.  She denies any fevers.  She is regularly changing the dressing on her left partial foot amputation.  She states she has had some drainage, which her surgeon has seen.  She was started on a cream which she has been using.  HPI  Past Medical History:  Diagnosis Date  . Arthritis   . Asthma   . Coronary artery disease   . Diabetes mellitus without complication (HCC)   . Dyspnea   . Fibromyalgia   . GERD (gastroesophageal reflux disease)   . High cholesterol   . Hypertension   . 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 PONV (postoperative nausea and vomiting)   . PVD (peripheral vascular disease) (HCC)   . Urinary frequency   . Urinary incontinence     Patient Active Problem List   Diagnosis Date Noted  . Asthma 12/11/2016  . Post-operative pain   . Type 2 diabetes mellitus with peripheral neuropathy (HCC)   . AKI (acute kidney injury) (HCC)   . Status post transmetatarsal amputation of foot, left (HCC) 11/21/2016  . S/P transmetatarsal amputation of foot, left (HCC)   . Acute blood loss anemia   . Diabetes mellitus type 2 in nonobese (HCC)   . Benign essential HTN   . Fibromyalgia   . Postoperative pain   . Infected prosthetic vascular graft (HCC)  09/10/2016  . Atherosclerosis of native arteries of extremities with gangrene, left leg (HCC) 07/20/2016  . GERD (gastroesophageal reflux disease) 07/01/2016  . Adjustment reaction with anxiety and depression 07/01/2016  . Wound infection 06/12/2016  . CAD (coronary artery disease) of artery bypass graft 04/15/2016  . Coronary artery disease involving coronary bypass graft of native heart with angina pectoris (HCC)   . Coronary artery disease involving native coronary artery of native heart without angina pectoris 04/07/2016  . PAD (peripheral artery disease) (HCC) 03/30/2016  . Diabetic foot ulcers (HCC) 03/30/2016  . Hyperlipidemia 03/30/2016  . Diabetic neuropathy (HCC) 02/24/2016  . Tobacco abuse 02/24/2016  . Overactive bladder 01/14/2016  . Diabetes mellitus without complication (HCC) 10/18/2006  . Essential hypertension 10/18/2006  . LOW BACK PAIN, CHRONIC 10/18/2006    Past Surgical History:  Procedure Laterality Date  . ABDOMINAL AORTOGRAM W/LOWER EXTREMITY N/A 03/25/2016   Procedure: Abdominal Aortogram w/Lower Extremity;  Surgeon: Maeola Harman, MD;  Location: [facility] INVASIVE CV LAB;  Service: Cardiovascular;  Laterality: N/A;  . ABDOMINAL AORTOGRAM W/LOWER EXTREMITY N/A 10/12/2016   Procedure: ABDOMINAL AORTOGRAM W/LOWER EXTREMITY;  Surgeon: Maeola Harman, MD;  Location: [facility] INVASIVE CV LAB;  Service: Cardiovascular;  Laterality: N/A;  . ABDOMINAL HYSTERECTOMY    . AMPUTATION TOE Left 10/13/2016   Procedure: AMPUTATION LEFT  GREAT TOE AND SECOND TOE;  Surgeon: Maeola Harmanain, Brandon Christopher, MD;  [facility];  Service: Vascular;  Laterality: Left;  . AORTOGRAM N/A 09/16/2016   Procedure: AORTOGRAM WITH LEFT LOWER EXTREMITY RUNOFF;  Surgeon: Sherren KernsFields, Charles E, MD;  [facility];  Service: Vascular;  Laterality: N/A;  . APPLICATION OF WOUND VAC Right 06/14/2016   Procedure: APPLICATION OF WOUND VAC;  Surgeon: Sherren KernsFields, Charles E, MD;  Location: [facility] OR;  Service:  Vascular;  Laterality: Right;  . COLONOSCOPY    . CORONARY ARTERY BYPASS GRAFT    . CORONARY STENT INTERVENTION N/A 04/15/2016   Procedure: Coronary Stent Intervention;  Surgeon: Lennette Biharihomas A Kelly, MD;  Location: [facility] INVASIVE CV LAB;  Service: Cardiovascular;  Laterality: N/A;  . ENDARTERECTOMY FEMORAL Right 05/21/2016   Procedure: RIGHT FEMORAL ENDARTERECTOMY;  Surgeon: Maeola Harmanain, Brandon Christopher, MD;  [facility];  Service: Vascular;  Laterality: Right;  . ENDARTERECTOMY FEMORAL Left 07/20/2016   Procedure: LEFT FEMORAL ENDARTERECTOMY;  Surgeon: Maeola Harmanain, Brandon Christopher, MD;  [facility];  Service: Vascular;  Laterality: Left;  . FEMORAL BYPASS Left 07/20/2016   Left common femoral endarterectomy  . FEMORAL-POPLITEAL BYPASS GRAFT Right 05/21/2016   Procedure: RIGHT FEMORAL-BELOW KNEE POPLITEAL ARTERY BYPASS GRAFT USING PROPATEN GORETEX GRAFT;  Surgeon: Maeola Harmanain, Brandon Christopher, MD;  [facility];  Service: Vascular;  Laterality: Right;  . FEMORAL-POPLITEAL BYPASS GRAFT Left 07/20/2016   Procedure: LEFT FEMORAL TO BELOW KNEE POPLITEAL ARTERY  BYPASS GRAFT;  Surgeon: Maeola Harmanain, Brandon Christopher, MD;  [facility];  Service: Vascular;  Laterality: Left;  . GROIN DEBRIDEMENT Right 06/14/2016   Procedure: GROIN DEBRIDEMENT;  Surgeon: Sherren KernsFields, Charles E, MD;  [facility];  Service: Vascular;  Laterality: Right;  . I&D EXTREMITY N/A 09/12/2016   Procedure: Irrigation and Debridment Left Groin; Closure of Distal Incisions Left Leg; Medial Calf and Medial Thigh;  Surgeon: Maeola Harmanain, Brandon Christopher, MD;  [facility];  Service: Vascular;  Laterality: N/A;  . KNEE SURGERY    . LEFT HEART CATH AND CORS/GRAFTS ANGIOGRAPHY N/A 04/15/2016   Procedure: Left Heart Cath and Cors/Grafts Angiography;  Surgeon: Lennette Biharihomas A Kelly, MD;  Location: [facility] INVASIVE CV LAB;  Service: Cardiovascular;  Laterality: N/A;  . MULTIPLE TOOTH EXTRACTIONS    . MUSCLE FLAP CLOSURE Left 09/18/2016   Procedure: LEFT GRACILIS MUSCLE FLAP  TO LEFT GROIN;  Surgeon: Glenna Fellowshimmappa, Brinda, MD;  Location: [facility] OR;  Service: Plastics;  Laterality: Left;  . PATCH ANGIOPLASTY Left 09/10/2016   Procedure: [place]ovine PATCH ANGIOPLASTY Left Femoral Artery and Below Knee Popliteal Artery;  Surgeon: Fransisco Hertzhen, Brian L, MD;  [facility];  Service: Vascular;  Laterality: Left;  . PERIPHERAL VASCULAR INTERVENTION Left 10/12/2016   Procedure: PERIPHERAL VASCULAR INTERVENTION;  Surgeon: Maeola Harmanain, Brandon Christopher, MD;  [facility];  Service: Cardiovascular;  Laterality: Left;  . REMOVAL OF GRAFT Left 09/10/2016   Procedure: Excision of Left Femoral-Popliteal Graft, Placement of Antibiotic Beads Left Leg;  Surgeon: Fransisco Hertzhen, Brian L, MD;  [facility];  Service: Vascular;  Laterality: Left;  . TRANSMETATARSAL AMPUTATION Left 11/19/2016   Procedure: TRANSMETATARSAL AMPUTATION;  Surgeon: Maeola Harmanain, Brandon Christopher, MD;  [facility];  Service: Vascular;  Laterality: Left;    OB History    No data available       Home Medications    Prior to Admission medications   Medication Sig Start Date End Date Taking? Authorizing Provider  acetaminophen (TYLENOL) 325 MG tablet Take 2 tablets (650 mg total) by  mouth every 6 (six) hours as needed for mild pain. 12/04/16   Love, Evlyn KannerPamela S, PA-C  albuterol (PROVENTIL HFA;VENTOLIN HFA) 108 (90 Base) MCG/ACT inhaler Inhale 2 puffs into the lungs every 6 (six) hours as needed for wheezing or shortness of breath. 12/11/16   Jaclyn ShaggyAmao, Enobong, MD  aspirin EC 81 MG tablet Take 1 tablet (81 mg total) by mouth daily. 10/16/15   Pete GlatterLangeland, Dawn T, MD  atorvastatin (LIPITOR) 80 MG tablet Take 1 tablet (80 mg total) by mouth daily. 12/11/16   Jaclyn ShaggyAmao, Enobong, MD  clopidogrel (PLAVIX) 75 MG tablet Take 1 tablet (75 mg total) by mouth daily with breakfast. 12/11/16   Jaclyn ShaggyAmao, Enobong, MD  docusate sodium (COLACE) 100 MG capsule Take 1 capsule (100 mg total) by mouth daily. 12/11/16   Jaclyn ShaggyAmao, Enobong, MD  DULoxetine (CYMBALTA) 60 MG  capsule Take 1 capsule (60 mg total) by mouth daily. 12/11/16   Jaclyn ShaggyAmao, Enobong, MD  Fluticasone-Salmeterol (ADVAIR) 100-50 MCG/DOSE AEPB Inhale 1 puff into the lungs 2 (two) times daily. 12/11/16   Jaclyn ShaggyAmao, Enobong, MD  HYDROcodone-acetaminophen (NORCO/VICODIN) 5-325 MG tablet Take 1 tablet by mouth every 6 (six) hours as needed for moderate pain. 12/04/16   Love, Evlyn KannerPamela S, PA-C  hydrocortisone cream 0.5 % Apply 1 application topically 2 (two) times daily. 12/25/16   Jaclyn ShaggyAmao, Enobong, MD  Insulin Glargine (LANTUS SOLOSTAR) 100 UNIT/ML Solostar Pen Inject 25 Units into the skin at bedtime. 12/04/16   Love, Evlyn KannerPamela S, PA-C  Insulin Pen Needle (B-D UF III MINI PEN NEEDLES) 31G X 5 MM MISC Use as directed 12/07/16   Jaclyn ShaggyAmao, Enobong, MD  lisinopril (PRINIVIL,ZESTRIL) 5 MG tablet Take 1 tablet (5 mg total) by mouth daily. 12/11/16   Jaclyn ShaggyAmao, Enobong, MD  metFORMIN (GLUCOPHAGE) 500 MG tablet Take 1 tablet (500 mg total) by mouth 2 (two) times daily. 12/11/16   Jaclyn ShaggyAmao, Enobong, MD  metoprolol tartrate (LOPRESSOR) 25 MG tablet Take 0.5 tablets (12.5 mg total) by mouth 2 (two) times daily. 12/11/16   Jaclyn ShaggyAmao, Enobong, MD  oxybutynin (DITROPAN) 5 MG tablet Take 1 tablet (5 mg total) by mouth 2 (two) times daily. 12/11/16   Jaclyn ShaggyAmao, Enobong, MD  pantoprazole (PROTONIX) 20 MG tablet Take 1 tablet (20 mg total) by mouth daily. 12/11/16   Jaclyn ShaggyAmao, Enobong, MD  pregabalin (LYRICA) 100 MG capsule Take 1 capsule (100 mg total) by mouth 3 (three) times daily. 12/04/16   Jacquelynn CreeLove, Pamela S, PA-C    Family History Family History  Problem Relation Age of Onset  . Stroke Mother     Social History Social History   Tobacco Use  . Smoking status: Former Smoker    Packs/day: 0.25    Types: Cigarettes    Last attempt to quit: 04/04/2016    Years since quitting: 0.7  . Smokeless tobacco: Never Used  Substance Use Topics  . Alcohol use: No  . Drug use: No     Allergies   Naproxen and Tramadol hcl   Review of Systems Review of Systems   Constitutional: Negative for chills and fever.  HENT: Negative for facial swelling and sore throat.   Respiratory: Positive for shortness of breath.   Cardiovascular: Negative for chest pain.  Gastrointestinal: Negative for abdominal pain, nausea and vomiting.  Genitourinary: Negative for dysuria.  Musculoskeletal: Negative for back pain.  Skin: Positive for wound. Negative for rash.  Neurological: Negative for headaches.  Psychiatric/Behavioral: The patient is not nervous/anxious.      Physical Exam Updated Vital Signs BP Marland Kitchen(!)  161/63   Pulse 81   Temp 99 F (37.2 C) (Oral)   Resp 17   SpO2 100%   Physical Exam  Constitutional: She appears well-developed and well-nourished. No distress.  HENT:  Head: Normocephalic and atraumatic.  Mouth/Throat: Oropharynx is clear and moist. No oropharyngeal exudate.  Eyes: Conjunctivae are normal. Pupils are equal, round, and reactive to light. Right eye exhibits no discharge. Left eye exhibits no discharge. No scleral icterus.  Neck: Normal range of motion. Neck supple. No thyromegaly present.  Cardiovascular: Normal rate, regular rhythm, normal heart sounds and intact distal pulses. Exam reveals no gallop and no friction rub.  No murmur heard. Pulmonary/Chest: Effort normal. No stridor. No respiratory distress. She has decreased breath sounds. She has no wheezes. She has no rales.  Abdominal: Soft. Bowel sounds are normal. She exhibits no distension. There is no tenderness. There is no rebound and no guarding.  Musculoskeletal:       Right lower leg: She exhibits edema (2+ pitting).       Left lower leg: She exhibits edema (2+ pitting).  Lymphadenopathy:    She has no cervical adenopathy.  Neurological: She is alert. Coordination normal.  Skin: Skin is warm and dry. No rash noted. She is not diaphoretic. No pallor.  Mild yellow drainage from small area of L foot partial amputation site, no significant surrounding erythema, some sutures  still in place in areas that are well healing without erythema or drainage  Psychiatric: She has a normal mood and affect.  Nursing note and vitals reviewed.    ED Treatments / Results  Labs (all labs ordered are listed, but only abnormal results are displayed) Labs Reviewed  BASIC METABOLIC PANEL - Abnormal; Notable for the following components:      Result Value   Glucose, Bld 275 (*)    Calcium 8.8 (*)    All other components within normal limits  CBC - Abnormal; Notable for the following components:   WBC 10.7 (*)    RBC 3.10 (*)    Hemoglobin 8.8 (*)    HCT 29.6 (*)    MCHC 29.7 (*)    RDW 15.8 (*)    All other components within normal limits  BRAIN NATRIURETIC PEPTIDE - Abnormal; Notable for the following components:   B Natriuretic Peptide 685.0 (*)    All other components within normal limits  I-STAT TROPONIN, ED    EKG  EKG Interpretation  Date/Time:  Wednesday December 30 2016 03:13:48 EST Ventricular Rate:  100 PR Interval:  170 QRS Duration: 80 QT Interval:  356 QTC Calculation: 459 R Axis:   78 Text Interpretation:  Normal sinus rhythm Possible Anterior infarct , age undetermined Abnormal ECG No STEMI.  Confirmed by Alona Bene [phone]) on 12/30/2016 9:55:54 AM       Radiology Dg Chest 2 View  Result Date: 12/30/2016 CLINICAL DATA:  Cough and shortness of breath tonight. EXAM: CHEST  2 VIEW COMPARISON:  Radiographs 05/22/2016 FINDINGS: Post median sternotomy. Similar cardiomegaly. Increased interstitial thickening. No definite pleural effusion. No focal consolidation or pneumothorax. No acute osseous abnormality. IMPRESSION: Increased interstitial thickening which may be pulmonary edema or bronchitic change. Mild cardiomegaly is stable. Electronically Signed   By: Rubye Oaks M.D.   On: 12/30/2016 04:07    Procedures Procedures (including critical care time)  Medications Ordered in ED Medications  furosemide (LASIX) injection 40 mg (not  administered)  albuterol (PROVENTIL) (2.5 MG/3ML) 0.083% nebulizer solution 5 mg (5 mg Nebulization Given  12/30/16 0327)  ipratropium-albuterol (DUONEB) 0.5-2.5 (3) MG/3ML nebulizer solution 3 mL (3 mLs Nebulization Given 12/30/16 0933)     Initial Impression / Assessment and Plan / ED Course  I have reviewed the triage vital signs and the nursing notes.  Pertinent labs & imaging results that were available during my care of the patient were reviewed by me and considered in my medical decision making (see chart for details).     Patient with acute CHF exacerbation.  Patient with shortness of breath and lower extremity edema.  EKG shows NSR.  Chest x-ray shows increased interstitial thickening which may be pulmonary edema or bronchitic change.  Most likely pulmonary edema in setting of patient's symptoms.  No cough or fever.  BNP 685.  Troponin negative.  Mild elevation in white blood cell count, 10.7.  Patient does have some drainage from amputation site, however patient reports that is stable and her surgeon has evaluated.  Will defer antibiotics at this time.  40 mg Lasix IV given in the ED.  I consulted Dr. Dimple Casey with the internal medicine teaching service who will admit the patient for further evaluation and treatment.  Patient also evaluated by Dr. Jacqulyn Bath who guided the patient's management and agrees with plan.    Final Clinical Impressions(s) / ED Diagnoses   Final diagnoses:  Acute diastolic congestive heart failure [facility])  Shortness of breath    ED Discharge Orders    None       Emi Holes, PA-C 12/30/16 1039    Maia Plan, MD 12/30/16 [phone]

## 2016-12-30 NOTE — ED Notes (Signed)
Tech tried x2 to draw blood from Pt without success. May need nurse to draw off IV.

## 2016-12-30 NOTE — ED Notes (Signed)
Heart Healthy carb modified lunch tray ordered at 1315

## 2016-12-30 NOTE — ED Notes (Signed)
Called 5W to give report.  Receiving RN unable to take report at this time. 

## 2016-12-30 NOTE — Progress Notes (Signed)
Andrea [name] is a 66 y.o. female patient admitted from ED awake, alert - oriented  X 4 - no acute distress noted.  VSS - Blood pressure (!) 116/40, pulse 63, temperature 98.5 F (36.9 C), temperature source Oral, resp. rate 19, height 5\' 2"  (1.575 m), weight 80.7 kg (177 lb 14.6 oz), SpO2 95 %.    IV in place, occlusive dsg intact without redness.  Orientation to room, and floor completed with information packet given to patient/family.  Patient declined safety video at this time.  Admission INP armband ID verified with patient/family, and in place.   SR up x 2, fall assessment complete, with patient and family able to verbalize understanding of risk associated with falls, and verbalized understanding to call nsg before up out of bed.  Call light within reach, patient able to voice, and demonstrate understanding.  Skin, clean-dry- intact without evidence of bruising, or skin tears.    Will cont to eval and treat per MD orders.  Casper HarrisonSamantha K Philisha Weinel, RN 12/30/2016 6:47 PM

## 2016-12-30 NOTE — ED Triage Notes (Signed)
Pt reports on and off shortness of breath through the day today, has been using inhaler without relief. No fevers, +cough

## 2016-12-31 ENCOUNTER — Observation Stay (HOSPITAL_BASED_OUTPATIENT_CLINIC_OR_DEPARTMENT_OTHER): Payer: BLUE CROSS/BLUE SHIELD

## 2016-12-31 DIAGNOSIS — Z79899 Other long term (current) drug therapy: Secondary | ICD-10-CM

## 2016-12-31 DIAGNOSIS — Z794 Long term (current) use of insulin: Secondary | ICD-10-CM

## 2016-12-31 DIAGNOSIS — I2510 Atherosclerotic heart disease of native coronary artery without angina pectoris: Secondary | ICD-10-CM

## 2016-12-31 DIAGNOSIS — J449 Chronic obstructive pulmonary disease, unspecified: Secondary | ICD-10-CM

## 2016-12-31 DIAGNOSIS — E785 Hyperlipidemia, unspecified: Secondary | ICD-10-CM | POA: Diagnosis not present

## 2016-12-31 DIAGNOSIS — E1140 Type 2 diabetes mellitus with diabetic neuropathy, unspecified: Secondary | ICD-10-CM | POA: Diagnosis not present

## 2016-12-31 DIAGNOSIS — Z955 Presence of coronary angioplasty implant and graft: Secondary | ICD-10-CM

## 2016-12-31 DIAGNOSIS — Z951 Presence of aortocoronary bypass graft: Secondary | ICD-10-CM | POA: Diagnosis not present

## 2016-12-31 DIAGNOSIS — I5031 Acute diastolic (congestive) heart failure: Secondary | ICD-10-CM | POA: Diagnosis not present

## 2016-12-31 DIAGNOSIS — R0600 Dyspnea, unspecified: Secondary | ICD-10-CM | POA: Diagnosis not present

## 2016-12-31 DIAGNOSIS — Z7982 Long term (current) use of aspirin: Secondary | ICD-10-CM | POA: Diagnosis not present

## 2016-12-31 DIAGNOSIS — Z7902 Long term (current) use of antithrombotics/antiplatelets: Secondary | ICD-10-CM

## 2016-12-31 DIAGNOSIS — I110 Hypertensive heart disease with heart failure: Secondary | ICD-10-CM

## 2016-12-31 DIAGNOSIS — Z89422 Acquired absence of other left toe(s): Secondary | ICD-10-CM | POA: Diagnosis not present

## 2016-12-31 DIAGNOSIS — E1151 Type 2 diabetes mellitus with diabetic peripheral angiopathy without gangrene: Secondary | ICD-10-CM | POA: Diagnosis not present

## 2016-12-31 LAB — CBC
HCT: 31.7 % — ABNORMAL LOW (ref 36.0–46.0)
HEMOGLOBIN: 9.7 g/dL — AB (ref 12.0–15.0)
MCH: 29.3 pg (ref 26.0–34.0)
MCHC: 30.6 g/dL (ref 30.0–36.0)
MCV: 95.8 fL (ref 78.0–100.0)
PLATELETS: 269 10*3/uL (ref 150–400)
RBC: 3.31 MIL/uL — AB (ref 3.87–5.11)
RDW: 16 % — ABNORMAL HIGH (ref 11.5–15.5)
WBC: 6.3 10*3/uL (ref 4.0–10.5)

## 2016-12-31 LAB — BASIC METABOLIC PANEL
ANION GAP: 9 (ref 5–15)
BUN: 22 mg/dL — ABNORMAL HIGH (ref 6–20)
CALCIUM: 9 mg/dL (ref 8.9–10.3)
CHLORIDE: 105 mmol/L (ref 101–111)
CO2: 26 mmol/L (ref 22–32)
CREATININE: 1 mg/dL (ref 0.44–1.00)
GFR calc non Af Amer: 58 mL/min — ABNORMAL LOW (ref >60)
Glucose, Bld: 205 mg/dL — ABNORMAL HIGH (ref 65–99)
Potassium: 4.1 mmol/L (ref 3.5–5.1)
SODIUM: 140 mmol/L (ref 135–145)

## 2016-12-31 LAB — URINALYSIS, ROUTINE W REFLEX MICROSCOPIC
BILIRUBIN URINE: NEGATIVE
GLUCOSE, UA: NEGATIVE mg/dL
Ketones, ur: NEGATIVE mg/dL
NITRITE: POSITIVE — AB
PH: 7 (ref 5.0–8.0)
Protein, ur: 100 mg/dL — AB
SPECIFIC GRAVITY, URINE: 1.014 (ref 1.005–1.030)
Squamous Epithelial / LPF: NONE SEEN

## 2016-12-31 LAB — ECHOCARDIOGRAM COMPLETE
HEIGHTINCHES: 62 in
WEIGHTICAEL: 2948.87 [oz_av]

## 2016-12-31 LAB — GLUCOSE, CAPILLARY
GLUCOSE-CAPILLARY: 153 mg/dL — AB (ref 65–99)
GLUCOSE-CAPILLARY: 281 mg/dL — AB (ref 65–99)
Glucose-Capillary: 185 mg/dL — ABNORMAL HIGH (ref 65–99)
Glucose-Capillary: 171 mg/dL — ABNORMAL HIGH (ref 65–99)

## 2016-12-31 LAB — HIV ANTIBODY (ROUTINE TESTING W REFLEX): HIV Screen 4th Generation wRfx: NONREACTIVE

## 2016-12-31 LAB — MRSA PCR SCREENING: MRSA BY PCR: NEGATIVE

## 2016-12-31 MED ORDER — MAGNESIUM SULFATE 2 GM/50ML IV SOLN
2.0000 g | Freq: Once | INTRAVENOUS | Status: DC
  Filled 2016-12-31: qty 50

## 2016-12-31 MED ORDER — FUROSEMIDE 40 MG PO TABS
40.0000 mg | ORAL_TABLET | Freq: Two times a day (BID) | ORAL | Status: DC
  Administered 2016-12-31 – 2017-01-01 (×3): 40 mg via ORAL
  Filled 2016-12-31 (×3): qty 1

## 2016-12-31 NOTE — Progress Notes (Signed)
   Subjective:  Andrea [name] reports improvement in her shortness of breath and leg swelling this morning. She was able to lie flat with a pillow last night while sleeping. She denies chest pain. She states that her dyspnea on exertion started around the time of her transmetatarsal amputation, which was approximately 1 month ago.  Objective:  Vital signs in last 24 hours: Vitals:   12/30/16 2058 12/31/16 0444 12/31/16 0908 12/31/16 1040  BP: (!) 104/50  (!) 122/54 (!) 133/107  Pulse: 67 60 63   Resp: 18 18    Temp: 98.3 F (36.8 C)     TempSrc: Oral     SpO2: 96% 92% 98%   Weight:  184 lb 4.9 oz (83.6 kg)    Height:       GEN: Well-appearing, well-nourished. Alert and oriented. No acute distress.  RESP: Bibasilar crackles. Good air movement. No increased work of breathing. CV: Normal rate and regular rhythm. No murmurs, gallops, or rubs. 1+ pitting LE edema. ABD: Soft. Non-tender. Non-distended. Normoactive bowel sounds. EXT: 1+ BLE edema. Warm and well perfused. NEURO: Cranial nerves II-XII grossly intact. Able to lift all four extremities against gravity. No apparent audiovisual hallucinations. Speech fluent and appropriate. PSYCH: Patient is calm and pleasant. Appropriate affect. Well-groomed; speech is appropriate and on-subject.  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  Acute exacerbation of heart failure Shortness of breath and LE edema improved this morning. The previous weight we have on her is 128lbs on 12/11/2016. Today's weight is 135lbs. Net negative 3.8 liters over 24 hours. TTE today shows grade 2 DD and LVEF 50-55%, with no valvular abnormalities. Started on IV diuresis with good response, which will be transitioned to PO lasix 40mg  BID today. 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 - Continue low dose hydrocodone q6hrs PRN for pain  Type 2 diabetes mellitus with peripheral neuropathy Home regimen includes metformin and lantus 25u QHS - Lantus 15u QHS + SSI sensitive - Continue to monitor  HTN BP 126/35. HR in 60s-70s. Home regimen includes lisinopril 5mg  and metoprolol 12.5mg  BID. - Continue diuresis as above - Continue lisinopril and metoprolol  Hyperlipidemia - Continue home atorvastatin 80mg   Dispo: Anticipated discharge in approximately 1-2 day(s).   Scherrie GerlachHuang, Tayvin Preslar, MD 12/31/2016, 1:57 PM Pager: Demetrius CharityP [phone]

## 2016-12-31 NOTE — Progress Notes (Addendum)
bp 122/54 hr 63 spo2 98 provider notified for med pass.  Hold lisinopril and lasix, give beta blocker.  Dr. Tami LinHoung

## 2016-12-31 NOTE — Progress Notes (Signed)
[facility]  Patient Status: Active (receiving services up to time of hospitalization)  [facility] is providing the following services: RN  If patient discharges after hours, please call [phone].   Andrea FurnishDonna Fellmy 12/31/2016, 10:31 AM

## 2016-12-31 NOTE — Progress Notes (Signed)
  Echocardiogram 2D Echocardiogram has been performed.  Andrea [name] T Andrea [name] 12/31/2016, 12:15 PM

## 2016-12-31 NOTE — Discharge Summary (Signed)
Name: Andrea [name] B Washam MRN: [id number] DOB: [birth year] 66 y.o. PCP: Jaclyn ShaggyAmao, Enobong, MD  Date of Admission: 12/30/2016  8:58 AM Date of Discharge: 01/01/2017 Attending Physician: Inez CatalinaMullen, Emily B, MD  Discharge Diagnosis: 1. Acute exacerbation on chronic HFpEF  Discharge Medications: Allergies as of 01/01/2017      Reactions   Naproxen Itching   Tramadol Hcl Itching      Medication List    TAKE these medications   acetaminophen 325 MG tablet Commonly known as:  TYLENOL Take 2 tablets (650 mg total) by mouth every 6 (six) hours as needed for mild pain.   albuterol 108 (90 Base) MCG/ACT inhaler Commonly known as:  PROVENTIL HFA;VENTOLIN HFA Inhale 2 puffs into the lungs every 6 (six) hours as needed for wheezing or shortness of breath.   aspirin EC 81 MG tablet Take 1 tablet (81 mg total) by mouth daily.   atorvastatin 80 MG tablet Commonly known as:  LIPITOR Take 1 tablet (80 mg total) by mouth daily.   clopidogrel 75 MG tablet Commonly known as:  PLAVIX Take 1 tablet (75 mg total) by mouth daily with breakfast.   docusate sodium 100 MG capsule Commonly known as:  COLACE Take 1 capsule (100 mg total) by mouth daily.   DULoxetine 60 MG capsule Commonly known as:  CYMBALTA Take 1 capsule (60 mg total) by mouth daily.   Fluticasone-Salmeterol 100-50 MCG/DOSE Aepb Commonly known as:  ADVAIR Inhale 1 puff into the lungs 2 (two) times daily.   furosemide 40 MG tablet Commonly known as:  LASIX Take 1 tablet (40 mg total) by mouth 2 (two) times daily.   HYDROcodone-acetaminophen 5-325 MG tablet Commonly known as:  NORCO/VICODIN Take 1 tablet by mouth every 6 (six) hours as needed for moderate pain.   hydrocortisone cream 0.5 % Apply 1 application topically 2 (two) times daily.   Insulin Glargine 100 UNIT/ML Solostar Pen Commonly known as:  LANTUS SOLOSTAR Inject 25 Units into the skin at bedtime.   Insulin Pen Needle 31G X 5 MM Misc Commonly known as:  B-D  UF III MINI PEN NEEDLES Use as directed   lisinopril 5 MG tablet Commonly known as:  PRINIVIL,ZESTRIL Take 1 tablet (5 mg total) by mouth daily.   metFORMIN 500 MG tablet Commonly known as:  GLUCOPHAGE Take 1 tablet (500 mg total) by mouth 2 (two) times daily.   metoprolol tartrate 25 MG tablet Commonly known as:  LOPRESSOR Take 0.5 tablets (12.5 mg total) by mouth 2 (two) times daily.   oxybutynin 5 MG tablet Commonly known as:  DITROPAN Take 1 tablet (5 mg total) by mouth 2 (two) times daily.   pantoprazole 20 MG tablet Commonly known as:  PROTONIX Take 1 tablet (20 mg total) by mouth daily.   pregabalin 100 MG capsule Commonly known as:  LYRICA Take 1 capsule (100 mg total) by mouth 3 (three) times daily.   SANTYL ointment Generic drug:  collagenase Apply 1 application topically daily.       Disposition and follow-up:   [facility]  At the hospital follow up visit please address:  1.  Patient diagnosed with HFpEF on this admission. Please assess blood pressure and HR and changes to medications if needed. Please assess volume status and adjust lasix as needed.  2.  Labs / imaging needed at time of follow-up: BP, HR  3.  Pending labs/ test needing follow-up: None  Follow-up  Appointments: - PCP on December 28 and January 4 - Vascular on January 4  Hospital Course by problem list: Principal Problem:   (HFpEF) heart failure with preserved ejection fraction (HCC) Active Problems:   Essential hypertension   LOW BACK PAIN, CHRONIC   Diabetic neuropathy (HCC)   PAD (peripheral artery disease) (HCC)   Hyperlipidemia   Coronary artery disease involving coronary bypass graft of native heart with angina pectoris (HCC)   Type 2 diabetes mellitus with peripheral neuropathy (HCC)   1. Acute exacerbation on chronic HFpEF Patient admitted with worsening DOE, leg swelling, increased abdominal  girth, and orthopnea over the last few weeks. CXR with pulmonary edema, BNP 685. Physical exam positive for bibasilar crackles and LE edema. Troponin negative. Repeat echo showed normal LVEF, no valvular disease, and grade 2 DD. Diuresed with IV lasix with improvement in symptoms. Home metoprolol and lisinopril were continued. Discharged with PO Lasix 40mg  BID. Weight on discharge was 132lbs.  2. Asymptomatic Bacteriuria UA positive for UTI but patient denied dysuria or other urinary symptoms. No antibiotics prescribed.  3. PAD s/p L foot transmetatarsal amputation Coronary artery disease s/p CABG and DES (most recently 04/2016) Dual antiplatelet therapy with aspirin and plavix was continued throughout her hospital admission.  Discharge Vitals:   BP (!) 164/46 (BP Location: Left Arm)   Pulse 64   Temp 99 F (37.2 C)   Resp 18   Ht 5\' 2"  (1.575 m)   Wt 132 lb 9.6 oz (60.1 kg)   SpO2 96%   BMI 24.25 kg/m   Pertinent Labs, Studies, and Procedures:  CBC Latest Ref Rng & Units 12/31/2016 12/30/2016 12/04/2016  WBC 4.0 - 10.5 K/uL 6.3 10.7(H) 7.1  Hemoglobin 12.0 - 15.0 g/dL 1.6(X) 0.9(U) 0.4(V)  Hematocrit 36.0 - 46.0 % 31.7(L) 29.6(L) 27.3(L)  Platelets 150 - 400 K/uL 269 247 316   CMP Latest Ref Rng & Units 01/01/2017 12/31/2016 12/30/2016  Glucose 65 - 99 mg/dL 409(W) 119(J) 478(G)  BUN 6 - 20 mg/dL 95(A) 21(H) 20  Creatinine 0.44 - 1.00 mg/dL 0.86 5.78 4.69  Sodium 135 - 145 mmol/L 138 140 138  Potassium 3.5 - 5.1 mmol/L 3.8 4.1 3.9  Chloride 101 - 111 mmol/L 104 105 105  CO2 22 - 32 mmol/L 27 26 24   Calcium 8.9 - 10.3 mg/dL 8.9 9.0 6.2(X)  Total Protein 6.5 - 8.1 g/dL - - -  Total Bilirubin 0.3 - 1.2 mg/dL - - -  Alkaline Phos 38 - 126 U/L - - -  AST 15 - 41 U/L - - -  ALT 14 - 54 U/L - - -   BNP 685 HIV negative Troponin negative Mg 1.8 UA with moderate Hgb, 100 protein, positive nitrite, large leukocytes, +RBC, +WBC, many bacteria  CXR 12/31/2016 Increased  interstitial thickening which may be pulmonary edema or bronchitic change. Mild cardiomegaly is stable.  TTE 12/31/2016 - Left ventricle: The cavity size was normal. There was mild   concentric hypertrophy. Systolic function was normal. The   estimated ejection fraction was in the range of 50% to 55%.   Diffuse hypokinesis. Features are consistent with a pseudonormal   left ventricular filling pattern, with concomitant abnormal   relaxation and increased filling pressure (grade 2 diastolic   dysfunction). Doppler parameters are consistent with high   ventricular filling pressure. - Aortic valve: Transvalvular velocity was within the normal range.   There was no stenosis. There was no regurgitation. - Mitral valve: Transvalvular velocity was within  the normal range.   There was no evidence for stenosis. There was trivial   regurgitation. - Left atrium: The atrium was moderately dilated. - Right ventricle: The cavity size was normal. Wall thickness was   normal. Systolic function was normal. - Tricuspid valve: There was no regurgitation.  Discharge Instructions: Discharge Instructions    (HEART FAILURE PATIENTS) Call MD:  Anytime you have any of the following symptoms: 1) 3 pound weight gain in 24 hours or 5 pounds in 1 week 2) shortness of breath, with or without a dry hacking cough 3) swelling in the hands, feet or stomach 4) if you have to sleep on extra pillows at night in order to breathe.   Complete by:  As directed    Call MD for:  difficulty breathing, headache or visual disturbances   Complete by:  As directed    Call MD for:  persistant dizziness or light-headedness   Complete by:  As directed    Call MD for:  persistant nausea and vomiting   Complete by:  As directed    Call MD for:  severe uncontrolled pain   Complete by:  As directed    Call MD for:  temperature >100.4   Complete by:  As directed    Diet - low sodium heart healthy   Complete by:  As directed     Increase activity slowly   Complete by:  As directed      Signed: Scherrie GerlachHuang, Estel Scholze, MD 01/01/2017, 11:19 AM   Pager: Demetrius CharityP [phone]

## 2016-12-31 NOTE — Progress Notes (Signed)
Responded to spiritual care consult to assist patient with A D. Patient wanted to take time to look over form and then will have nurse page Chaplain when ready. Chaplain available as needed.  Venida JarvisWatlington, Dianelly Ferran, [place], [facility], Pager [phone]

## 2017-01-01 ENCOUNTER — Telehealth: Payer: Self-pay | Admitting: Family Medicine

## 2017-01-01 DIAGNOSIS — Z955 Presence of coronary angioplasty implant and graft: Secondary | ICD-10-CM | POA: Diagnosis not present

## 2017-01-01 DIAGNOSIS — Z885 Allergy status to narcotic agent status: Secondary | ICD-10-CM | POA: Diagnosis not present

## 2017-01-01 DIAGNOSIS — R8271 Bacteriuria: Secondary | ICD-10-CM

## 2017-01-01 DIAGNOSIS — Z886 Allergy status to analgesic agent status: Secondary | ICD-10-CM

## 2017-01-01 DIAGNOSIS — I110 Hypertensive heart disease with heart failure: Secondary | ICD-10-CM | POA: Diagnosis not present

## 2017-01-01 DIAGNOSIS — Z89422 Acquired absence of other left toe(s): Secondary | ICD-10-CM | POA: Diagnosis not present

## 2017-01-01 DIAGNOSIS — I5033 Acute on chronic diastolic (congestive) heart failure: Secondary | ICD-10-CM

## 2017-01-01 DIAGNOSIS — Z951 Presence of aortocoronary bypass graft: Secondary | ICD-10-CM | POA: Diagnosis not present

## 2017-01-01 DIAGNOSIS — E1151 Type 2 diabetes mellitus with diabetic peripheral angiopathy without gangrene: Secondary | ICD-10-CM | POA: Diagnosis not present

## 2017-01-01 LAB — BASIC METABOLIC PANEL
ANION GAP: 7 (ref 5–15)
BUN: 25 mg/dL — ABNORMAL HIGH (ref 6–20)
CALCIUM: 8.9 mg/dL (ref 8.9–10.3)
CO2: 27 mmol/L (ref 22–32)
Chloride: 104 mmol/L (ref 101–111)
Creatinine, Ser: 0.89 mg/dL (ref 0.44–1.00)
GLUCOSE: 158 mg/dL — AB (ref 65–99)
POTASSIUM: 3.8 mmol/L (ref 3.5–5.1)
Sodium: 138 mmol/L (ref 135–145)

## 2017-01-01 LAB — GLUCOSE, CAPILLARY
GLUCOSE-CAPILLARY: 144 mg/dL — AB (ref 65–99)
GLUCOSE-CAPILLARY: 209 mg/dL — AB (ref 65–99)
Glucose-Capillary: 145 mg/dL — ABNORMAL HIGH (ref 65–99)

## 2017-01-01 MED ORDER — FUROSEMIDE 40 MG PO TABS: 40.0000 mg | ORAL_TABLET | Freq: Two times a day (BID) | ORAL | 0 refills | Status: DC

## 2017-01-01 NOTE — Progress Notes (Signed)
Patient discharge teaching given, including activity, diet, follow-up appoints, and medications. Patient verbalized understanding of all discharge instructions. IV access was d/c'd. Vitals are stable. Skin is intact except as charted in most recent assessments. Pt to be escorted out by NT, to be driven home by family. 

## 2017-01-01 NOTE — Telephone Encounter (Signed)
Met with patient during her hospitalization. Patient is known to the [facility] and therefore agreed to be seen by provider. Her appointment was scheduled for Friday, December 28 @ 2:00pm.  During our conversation patient confirmed that she is feeling better and hoped to be discharged today. Patient receives wound care but doesn't know how many more visits she has left.   Patient confirmed that the phone number that we have on Epic is correct as well as the emergency contact and insurance. Patient stated that she is not interested in home health. She is doing a lot better and has support from her sister.   Informed patient that the office and pharmacy will be closed Monday and Tuesday of next week. Patient stated that if she is prescribed any medication when discharged she will have them sent to [facility] by her house.   Spoke with Levada Dy, case manager, and informed her that I had seen patient and that our office and pharmacy will be closed on Monday and Tuesday.

## 2017-01-01 NOTE — Discharge Instructions (Addendum)
Please take lasix 40mg  twice a day.  Please follow up with your primary care doctor on December 28. Please follow up with your heart doctor as usual.    Heart Failure Heart failure means your heart has trouble pumping blood. This makes it hard for your body to work well. Heart failure is usually a long-term (chronic) condition. You must take good care of yourself and follow your doctor's treatment plan. Follow these instructions at home:  Take your heart medicine as told by your doctor. ? Do not stop taking medicine unless your doctor tells you to. ? Do not skip any dose of medicine. ? Refill your medicines before they run out. ? Take other medicines only as told by your doctor or pharmacist.  Stay active if told by your doctor. The elderly and people with severe heart failure should talk with a doctor about physical activity.  Eat heart-healthy foods. Choose foods that are without trans fat and are low in saturated fat, cholesterol, and salt (sodium). This includes fresh or frozen fruits and vegetables, fish, lean meats, fat-free or low-fat dairy foods, whole grains, and high-fiber foods. Lentils and dried peas and beans (legumes) are also good choices.  Limit salt if told by your doctor.  Cook in a healthy way. Roast, grill, broil, bake, poach, steam, or stir-fry foods.  Limit fluids as told by your doctor.  Weigh yourself every morning. Do this after you pee (urinate) and before you eat breakfast. Write down your weight to give to your doctor.  Take your blood pressure and write it down if your doctor tells you to.  Ask your doctor how to check your pulse. Check your pulse as told.  Lose weight if told by your doctor.  Stop smoking or chewing tobacco. Do not use gum or patches that help you quit without your doctor's approval.  Schedule and go to doctor visits as told.  Nonpregnant women should have no more than 1 drink a day. Men should have no more than 2 drinks a day.  Talk to your doctor about drinking alcohol.  Stop illegal drug use.  Stay current with shots (immunizations).  Manage your health conditions as told by your doctor.  Learn to manage your stress.  Rest when you are tired.  If it is really hot outside: ? Avoid intense activities. ? Use air conditioning or fans, or get in a cooler place. ? Avoid caffeine and alcohol. ? Wear loose-fitting, lightweight, and light-colored clothing.  If it is really cold outside: ? Avoid intense activities. ? Layer your clothing. ? Wear mittens or gloves, a hat, and a scarf when going outside. ? Avoid alcohol.  Learn about heart failure and get support as needed.  Get help to maintain or improve your quality of life and your ability to care for yourself as needed. Contact a doctor if:  You gain weight quickly.  You are more short of breath than usual.  You cannot do your normal activities.  You tire easily.  You cough more than normal, especially with activity.  You have any or more puffiness (swelling) in areas such as your hands, feet, ankles, or belly (abdomen).  You cannot sleep because it is hard to breathe.  You feel like your heart is beating fast (palpitations).  You get dizzy or light-headed when you stand up. Get help right away if:  You have trouble breathing.  There is a change in mental status, such as becoming less alert or not being able  to focus.  You have chest pain or discomfort.  You faint. This information is not intended to replace advice given to you by your health care provider. Make sure you discuss any questions you have with your health care provider. Document Released: 10/08/2007 Document Revised: 06/06/2015 Document Reviewed: 02/15/2012 Elsevier Interactive Patient Education  2017 [employer]

## 2017-01-01 NOTE — Progress Notes (Signed)
   Subjective:  Andrea [name] reports improvement in her shortness of breath and leg swelling this morning. She denies chest pain. She states she had an episode of pressure with urination yesterday, however denies dysuria, urinary frequency, or urinary urgency otherwise.  Objective:  Vital signs in last 24 hours: Vitals:   12/31/16 1936 12/31/16 2159 01/01/17 0500 01/01/17 0546  BP:  (!) 153/42  (!) 164/46  Pulse:  66  64  Resp:  18  18  Temp:  98.3 F (36.8 C)  99 F (37.2 C)  TempSrc:      SpO2: 96% 95%  93%  Weight:   132 lb 9.6 oz (60.1 kg)   Height:       GEN: Well-appearing, well-nourished. Alert and oriented. No acute distress. RESP: CTAB. No increased work of breathing. CV: Normal rate and regular rhythm. No murmurs, gallops, or rubs. No LE edema. ABD: Soft. Non-tender. Non-distended. Normoactive bowel sounds. EXT: No BLE edema. Warm and well perfused. NEURO: Cranial nerves II-XII grossly intact. Able to lift all four extremities against gravity. No apparent audiovisual hallucinations. Speech fluent and appropriate. PSYCH: Patient is calm and pleasant. Appropriate affect. Well-groomed; speech is appropriate and on-subject.  Assessment/Plan:  Andrea [name] is a 66 yo woman with a reported history of asthma, COPD, CAD s/p 3v CABG and DES (most recently 04/2016), diabetes with neuropathy, recent transmetatarsal amputation of the left foot secondary to PAD who presented with worsening shortness of breath.  Acute exacerbation of heart failure Shortness of breath and LE edema improved this morning. The previous weight we have on her is 128lbs on 12/11/2016. Today's weight is 132lbs. Net negative 2.1 liters over 24 hours. TTE 12/21 shows grade 2 DD and LVEF 50-55%, with no valvular abnormalities. Transitioned to PO lasix 40mg  BID yesterday. - Continue PO lasix 40mg  BID - Encourage ambulation - Daily weights - Strict I/Os - Discharge today - Follow up with PCP  1/4  Asymptomatic Bacteriuria UA shows UTI, however patient denies dysuria or other urinary symptoms. Will not treat with antibiotics.  PAD and associated L foot transmetatarsal amputation Coronary artery disease involving coronary bypass graft of native heart - Continue DAPT with ASA and plavix. - Continue low dose hydrocodone q6hrs PRN for pain  Type 2 diabetes mellitus with peripheral neuropathy Home regimen includes metformin and lantus 25u QHS - Lantus 15u QHS + SSI sensitive - Continue to monitor  HTN BP 164/46. HR in 60s-70s. Home regimen includes lisinopril 5mg  and metoprolol 12.5mg  BID. - Continue diuresis as above - Continue lisinopril and metoprolol  Hyperlipidemia - Continue home atorvastatin 80mg   Dispo: Anticipated discharge today.  Scherrie GerlachHuang, Isao Seltzer, MD 01/01/2017, 8:12 AM Pager: Demetrius CharityP [phone]

## 2017-01-08 ENCOUNTER — Ambulatory Visit: Payer: BLUE CROSS/BLUE SHIELD | Admitting: Family Medicine

## 2017-01-08 ENCOUNTER — Telehealth: Payer: Self-pay | Admitting: Family Medicine

## 2017-01-08 NOTE — Telephone Encounter (Signed)
Call placed twice to patient #[phone] to check on her status and reschedule her appointment. No answer. Call dropped after several rings.

## 2017-01-08 NOTE — Telephone Encounter (Signed)
Pt called back returning your call °

## 2017-01-08 NOTE — Telephone Encounter (Signed)
Returned patient's call and informed her that about follow up appointment and having to reschedule it. Patient was not happy. Scheduled new hospital follow up for 01/22/17 at 2pm.   In our brief conversation patient stated that she is doing ok and taking her medication.

## 2017-01-13 NOTE — Addendum Note (Signed)
Addended by: Jaclyn ShaggyAMAO, Nur Krasinski on: 01/13/2017 09:19 AM   Modules accepted: Level of Service

## 2017-01-15 ENCOUNTER — Ambulatory Visit (INDEPENDENT_AMBULATORY_CARE_PROVIDER_SITE_OTHER): Payer: BLUE CROSS/BLUE SHIELD | Admitting: Vascular Surgery

## 2017-01-15 ENCOUNTER — Encounter: Payer: Self-pay | Admitting: Vascular Surgery

## 2017-01-15 ENCOUNTER — Ambulatory Visit: Payer: BLUE CROSS/BLUE SHIELD | Admitting: Family Medicine

## 2017-01-15 VITALS — BP 125/65 | HR 71 | Temp 99.8°F | Resp 18 | Ht 62.0 in | Wt 139.0 lb

## 2017-01-15 DIAGNOSIS — T8189XD Other complications of procedures, not elsewhere classified, subsequent encounter: Secondary | ICD-10-CM

## 2017-01-15 NOTE — Progress Notes (Signed)
Subjective:     Patient ID: Andrea [name], female   DOB: 1950/05/18, 67 y.o.   MRN: [id number]  HPI 67 year old female follows up for wound check on the left foot.  She has been using Santyl and is healing nicely.  She does not have any fevers chills or erythema.  She does have some cramping of her right foot but she is able to walk.  She is here today to have sutures removed.   Review of Systems Cramps in right foot    Objective:   Physical Exam aaox3 Non labored respirations Well healed groin incisions bilaterally Left tma site with middel 2cm open with granulation and some fibrinous tissue, sutures in place elsewhere    Assessment/plan     67 year old female follows up from recent left TMA.  She also has a right lower extremity bypass and left stenting of her entire SFA serving as an endovascular bypass.  She initially had toe amputations prior to the TMA and has had some nonhealing of this but is now healing better with Santyl.  She is not smoking.  She will continue Santyl and follow-up in 3 months with bilateral lower extremity duplexes and ABIs.  If she has problems with the wound I have urged her to call to be seen sooner.  If she is hospitalized for any reason I would also like to be contacted to see her.  She demonstrates good understanding. Will also check carotid duplexes prior to visit.   Charity Tessier C. Randie Heinzain, MD [facility] Office: [phone] Pager: [phone]

## 2017-01-18 ENCOUNTER — Ambulatory Visit: Payer: BLUE CROSS/BLUE SHIELD | Admitting: Family Medicine

## 2017-01-18 NOTE — Addendum Note (Signed)
Addended by: Burton ApleyPETTY, Omesha Bowerman A on: 01/18/2017 11:54 AM   Modules accepted: Orders

## 2017-01-20 ENCOUNTER — Other Ambulatory Visit: Payer: Self-pay | Admitting: Family Medicine

## 2017-01-20 MED FILL — LISINOPRIL 5 MG TAB: 5 | 30 days supply | Qty: 30 | Fill #2

## 2017-01-20 MED FILL — PANTOPRAZOLE SOD DR 20 MG T: 20 | 30 days supply | Qty: 30 | Fill #1

## 2017-01-20 MED FILL — LANTUS SOLOSTAR 100 UNITS/M: 100 | 30 days supply | Qty: 9 | Fill #2

## 2017-01-20 MED FILL — DULoxetine HCL 60 MG CPEP: 60 | 30 days supply | Qty: 30 | Fill #1

## 2017-01-20 MED FILL — OXYBUTYNIN 5 MG TABLET: 5 | 30 days supply | Qty: 60 | Fill #0

## 2017-01-20 MED FILL — METOPROLOL TARTRATE 25 MG T: 25 | 30 days supply | Qty: 30 | Fill #0

## 2017-01-20 MED FILL — CLOPIDOGREL 75 MG TABLET: 75 | 90 days supply | Qty: 90 | Fill #2

## 2017-01-22 ENCOUNTER — Encounter: Payer: Self-pay | Admitting: Family Medicine

## 2017-01-22 ENCOUNTER — Ambulatory Visit: Payer: BLUE CROSS/BLUE SHIELD | Attending: Family Medicine | Admitting: Family Medicine

## 2017-01-22 VITALS — BP 130/63 | HR 58 | Temp 97.8°F | Ht 62.0 in | Wt 140.0 lb

## 2017-01-22 DIAGNOSIS — Z7982 Long term (current) use of aspirin: Secondary | ICD-10-CM | POA: Insufficient documentation

## 2017-01-22 DIAGNOSIS — R300 Dysuria: Secondary | ICD-10-CM | POA: Diagnosis not present

## 2017-01-22 DIAGNOSIS — E1151 Type 2 diabetes mellitus with diabetic peripheral angiopathy without gangrene: Secondary | ICD-10-CM | POA: Diagnosis not present

## 2017-01-22 DIAGNOSIS — M79642 Pain in left hand: Secondary | ICD-10-CM | POA: Diagnosis not present

## 2017-01-22 DIAGNOSIS — Z7902 Long term (current) use of antithrombotics/antiplatelets: Secondary | ICD-10-CM | POA: Insufficient documentation

## 2017-01-22 DIAGNOSIS — I2510 Atherosclerotic heart disease of native coronary artery without angina pectoris: Secondary | ICD-10-CM | POA: Diagnosis not present

## 2017-01-22 DIAGNOSIS — J45909 Unspecified asthma, uncomplicated: Secondary | ICD-10-CM | POA: Insufficient documentation

## 2017-01-22 DIAGNOSIS — E1149 Type 2 diabetes mellitus with other diabetic neurological complication: Secondary | ICD-10-CM

## 2017-01-22 DIAGNOSIS — Z951 Presence of aortocoronary bypass graft: Secondary | ICD-10-CM | POA: Insufficient documentation

## 2017-01-22 DIAGNOSIS — Z794 Long term (current) use of insulin: Secondary | ICD-10-CM | POA: Diagnosis not present

## 2017-01-22 DIAGNOSIS — Z95828 Presence of other vascular implants and grafts: Secondary | ICD-10-CM | POA: Insufficient documentation

## 2017-01-22 DIAGNOSIS — N3281 Overactive bladder: Secondary | ICD-10-CM

## 2017-01-22 DIAGNOSIS — M1990 Unspecified osteoarthritis, unspecified site: Secondary | ICD-10-CM | POA: Insufficient documentation

## 2017-01-22 DIAGNOSIS — I5031 Acute diastolic (congestive) heart failure: Secondary | ICD-10-CM

## 2017-01-22 DIAGNOSIS — M797 Fibromyalgia: Secondary | ICD-10-CM | POA: Diagnosis not present

## 2017-01-22 DIAGNOSIS — I110 Hypertensive heart disease with heart failure: Secondary | ICD-10-CM | POA: Diagnosis not present

## 2017-01-22 DIAGNOSIS — Z7951 Long term (current) use of inhaled steroids: Secondary | ICD-10-CM | POA: Insufficient documentation

## 2017-01-22 DIAGNOSIS — Z79899 Other long term (current) drug therapy: Secondary | ICD-10-CM | POA: Diagnosis not present

## 2017-01-22 DIAGNOSIS — E1142 Type 2 diabetes mellitus with diabetic polyneuropathy: Secondary | ICD-10-CM | POA: Diagnosis not present

## 2017-01-22 DIAGNOSIS — Z886 Allergy status to analgesic agent status: Secondary | ICD-10-CM | POA: Diagnosis not present

## 2017-01-22 DIAGNOSIS — M79641 Pain in right hand: Secondary | ICD-10-CM | POA: Insufficient documentation

## 2017-01-22 DIAGNOSIS — I509 Heart failure, unspecified: Secondary | ICD-10-CM | POA: Diagnosis present

## 2017-01-22 DIAGNOSIS — Z90710 Acquired absence of both cervix and uterus: Secondary | ICD-10-CM | POA: Insufficient documentation

## 2017-01-22 DIAGNOSIS — E7800 Pure hypercholesterolemia, unspecified: Secondary | ICD-10-CM | POA: Insufficient documentation

## 2017-01-22 DIAGNOSIS — I739 Peripheral vascular disease, unspecified: Secondary | ICD-10-CM

## 2017-01-22 DIAGNOSIS — Z888 Allergy status to other drugs, medicaments and biological substances status: Secondary | ICD-10-CM | POA: Diagnosis not present

## 2017-01-22 DIAGNOSIS — N3000 Acute cystitis without hematuria: Secondary | ICD-10-CM | POA: Diagnosis not present

## 2017-01-22 DIAGNOSIS — Z955 Presence of coronary angioplasty implant and graft: Secondary | ICD-10-CM | POA: Insufficient documentation

## 2017-01-22 DIAGNOSIS — Z89432 Acquired absence of left foot: Secondary | ICD-10-CM | POA: Diagnosis not present

## 2017-01-22 DIAGNOSIS — K219 Gastro-esophageal reflux disease without esophagitis: Secondary | ICD-10-CM | POA: Insufficient documentation

## 2017-01-22 LAB — POCT URINALYSIS DIPSTICK
BILIRUBIN UA: NEGATIVE
Glucose, UA: NEGATIVE
Nitrite, UA: NEGATIVE
PH UA: 6 (ref 5.0–8.0)
Protein, UA: 100
SPEC GRAV UA: 1.015 (ref 1.010–1.025)
UROBILINOGEN UA: 0.2 U/dL

## 2017-01-22 LAB — GLUCOSE, POCT (MANUAL RESULT ENTRY): POC GLUCOSE: 225 mg/dL — AB (ref 70–99)

## 2017-01-22 MED ORDER — PREGABALIN 100 MG PO CAPS: 100.0000 mg | ORAL_CAPSULE | Freq: Three times a day (TID) | ORAL | 3 refills | Status: DC

## 2017-01-22 MED ORDER — SULFAMETHOXAZOLE-TRIMETHOPRIM 800-160 MG PO TABS: 1.0000 | ORAL_TABLET | Freq: Two times a day (BID) | ORAL | 0 refills | Status: AC

## 2017-01-22 MED ORDER — FUROSEMIDE 40 MG PO TABS: 40.0000 mg | ORAL_TABLET | Freq: Two times a day (BID) | ORAL | 3 refills | Status: DC

## 2017-01-22 MED ORDER — DICLOFENAC SODIUM 1 % TD GEL: 4.0000 g | Freq: Four times a day (QID) | TRANSDERMAL | 1 refills | Status: AC

## 2017-01-22 MED FILL — FUROSEMIDE 40 MG TAB: 40 | 30 days supply | Qty: 60 | Fill #0

## 2017-01-22 MED FILL — SULFAMETHOXAZOLE-TMP DS TAB: 800-160 | 10 days supply | Qty: 20 | Fill #0

## 2017-01-22 NOTE — Progress Notes (Signed)
Subjective:  Patient ID: Andrea Brookingoni B Chiarelli, female    DOB: 1950-05-05  Age: 67 y.o. MRN: [id number]  CC: Congestive Heart Failure   HPI Andrea [name] is a 67 year old female with a history of type 2 diabetes mellitus (A1c 7.2), hypertension,Coronary artery disease status post CABG in 2006, status post stent in 2000  peripheral vascular disease status post bilateral  common femoral endarterectomy and left femoral to Low knee popliteal artery bypass, left transmetatarsal amputation in 11/2016 who presents today for follow-up visit after hospitalization for acute diastolic heart failure.   She had [facility] to [facility] with shortness of breath, pedal edema and abdominal swelling and was found to have an elevated BNP of 685, chest x-ray revealed increased interstitial thickening which may be pulmonary edema or bronchitic change, mild cardiomegaly. She was commenced on IV diuretics with resulting diuresis and improvement in symptoms.   Echocardiogram performed revealed EF 50-55%, grade 2 diastolic dysfunction. She was transitioned to oral Lasix and subsequently discharged.  Echo 12/31/2016 Study Conclusions  - Left ventricle: The cavity size was normal. There was mild   concentric hypertrophy. Systolic function was normal. The   estimated ejection fraction was in the range of 50% to 55%.   Diffuse hypokinesis. Features are consistent with a pseudonormal   left ventricular filling pattern, with concomitant abnormal   relaxation and increased filling pressure (grade 2 diastolic   dysfunction). Doppler parameters are consistent with high   ventricular filling pressure. - Aortic valve: Transvalvular velocity was within the normal range.   There was no stenosis. There was no regurgitation. - Mitral valve: Transvalvular velocity was within the normal range.   There was no evidence for stenosis. There was trivial   regurgitation. - Left atrium: The atrium was moderately dilated. -  Right ventricle: The cavity size was normal. Wall thickness was   normal. Systolic function was normal. - Tricuspid valve: There was no regurgitation  She presents today denying shortness of breath or chest pains or pedal edema and has been compliant with her Lasix and other medications however she complains of dysuria at the end of her stream with associated malodorous urine. Discharge notes had indicated a positive UA during hospitalization but given patient was asymptomatic no antibiotics were prescribed. She denies fever or flank pain.  She had a visit to her vascular surgeon- Dr Randie Heinzain 1 week ago and had sutures from her left stump taken out.  She continues to perform daily wound dressing changes at home and has upcoming visits with home health nurse. She complains of pain in the lateral 2 fingers of both hands and associated catching which almost makes her drop her dishes.  Past Medical History:  Diagnosis Date  . Arthritis   . Asthma   . Coronary artery disease   . Diabetes mellitus without complication (HCC)   . Dyspnea   . Fibromyalgia   . GERD (gastroesophageal reflux disease)   . High cholesterol   . Hypertension   . 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 PONV (postoperative nausea and vomiting)   . PVD (peripheral vascular disease) (HCC)   . Urinary frequency   . Urinary incontinence     Past Surgical History:  Procedure Laterality Date  . ABDOMINAL AORTOGRAM W/LOWER EXTREMITY N/A 03/25/2016   Procedure: Abdominal Aortogram w/Lower Extremity;  Surgeon: Maeola HarmanBrandon Christopher Cain, MD;  [facility];  Service: Cardiovascular;  Laterality: N/A;  . ABDOMINAL AORTOGRAM  W/LOWER EXTREMITY N/A 10/12/2016   Procedure: ABDOMINAL AORTOGRAM W/LOWER EXTREMITY;  Surgeon: Maeola Harman, MD;  Location: [facility] INVASIVE CV LAB;  Service: Cardiovascular;  Laterality: N/A;  . ABDOMINAL HYSTERECTOMY    . AMPUTATION TOE Left 10/13/2016   Procedure: AMPUTATION  LEFT GREAT TOE AND SECOND TOE;  Surgeon: Maeola Harman, MD;  Location: [facility] OR;  Service: Vascular;  Laterality: Left;  . AORTOGRAM N/A 09/16/2016   Procedure: AORTOGRAM WITH LEFT LOWER EXTREMITY RUNOFF;  Surgeon: Sherren Kerns, MD;  Location: [facility] OR;  Service: Vascular;  Laterality: N/A;  . APPLICATION OF WOUND VAC Right 06/14/2016   Procedure: APPLICATION OF WOUND VAC;  Surgeon: Sherren Kerns, MD;  Location: [facility] OR;  Service: Vascular;  Laterality: Right;  . COLONOSCOPY    . CORONARY ARTERY BYPASS GRAFT    . CORONARY STENT INTERVENTION N/A 04/15/2016   Procedure: Coronary Stent Intervention;  Surgeon: Lennette Bihari, MD;  Location: [facility] INVASIVE CV LAB;  Service: Cardiovascular;  Laterality: N/A;  . ENDARTERECTOMY FEMORAL Right 05/21/2016   Procedure: RIGHT FEMORAL ENDARTERECTOMY;  Surgeon: Maeola Harman, MD;  Location: [facility] OR;  Service: Vascular;  Laterality: Right;  . ENDARTERECTOMY FEMORAL Left 07/20/2016   Procedure: LEFT FEMORAL ENDARTERECTOMY;  Surgeon: Maeola Harman, MD;  Location: [facility] OR;  Service: Vascular;  Laterality: Left;  . FEMORAL BYPASS Left 07/20/2016   Left common femoral endarterectomy  . FEMORAL-POPLITEAL BYPASS GRAFT Right 05/21/2016   Procedure: RIGHT FEMORAL-BELOW KNEE POPLITEAL ARTERY BYPASS GRAFT USING PROPATEN GORETEX GRAFT;  Surgeon: Maeola Harman, MD;  Location: [facility] OR;  Service: Vascular;  Laterality: Right;  . FEMORAL-POPLITEAL BYPASS GRAFT Left 07/20/2016   Procedure: LEFT FEMORAL TO BELOW KNEE POPLITEAL ARTERY  BYPASS GRAFT;  Surgeon: Maeola Harman, MD;  Location: [facility] OR;  Service: Vascular;  Laterality: Left;  . GROIN DEBRIDEMENT Right 06/14/2016   Procedure: GROIN DEBRIDEMENT;  Surgeon: Sherren Kerns, MD;  Location: [facility] OR;  Service: Vascular;  Laterality: Right;  . I&D EXTREMITY N/A 09/12/2016   Procedure: Irrigation and Debridment Left Groin; Closure of Distal Incisions Left Leg; Medial Calf and Medial Thigh;   Surgeon: Maeola Harman, MD;  Location: [facility] OR;  Service: Vascular;  Laterality: N/A;  . KNEE SURGERY    . LEFT HEART CATH AND CORS/GRAFTS ANGIOGRAPHY N/A 04/15/2016   Procedure: Left Heart Cath and Cors/Grafts Angiography;  Surgeon: Lennette Bihari, MD;  Location: [facility] INVASIVE CV LAB;  Service: Cardiovascular;  Laterality: N/A;  . MULTIPLE TOOTH EXTRACTIONS    . MUSCLE FLAP CLOSURE Left 09/18/2016   Procedure: LEFT GRACILIS MUSCLE FLAP TO LEFT GROIN;  Surgeon: Glenna Fellows, MD;  Location: [facility] OR;  Service: Plastics;  Laterality: Left;  . PATCH ANGIOPLASTY Left 09/10/2016   Procedure: Bovine PATCH ANGIOPLASTY Left Femoral Artery and Below Knee Popliteal Artery;  Surgeon: Fransisco Hertz, MD;  Location: [facility] OR;  Service: Vascular;  Laterality: Left;  . PERIPHERAL VASCULAR INTERVENTION Left 10/12/2016   Procedure: PERIPHERAL VASCULAR INTERVENTION;  Surgeon: Maeola Harman, MD;  Location: [facility] INVASIVE CV LAB;  Service: Cardiovascular;  Laterality: Left;  . REMOVAL OF GRAFT Left 09/10/2016   Procedure: Excision of Left Femoral-Popliteal Graft, Placement of Antibiotic Beads Left Leg;  Surgeon: Fransisco Hertz, MD;  Location: [facility] OR;  Service: Vascular;  Laterality: Left;  . TRANSMETATARSAL AMPUTATION Left 11/19/2016   Procedure: TRANSMETATARSAL AMPUTATION;  Surgeon: Maeola Harman, MD;  Location: [facility] OR;  Service: Vascular;  Laterality: Left;    Allergies  Allergen Reactions  . Naproxen Itching  . Tramadol Hcl Itching     Outpatient Medications Prior to Visit  Medication Sig Dispense Refill  . acetaminophen (TYLENOL) 325 MG tablet Take 2 tablets (650 mg total) by mouth every 6 (six) hours as needed for mild pain.    Marland Kitchen albuterol (PROVENTIL HFA;VENTOLIN HFA) 108 (90 Base) MCG/ACT inhaler Inhale 2 puffs into the lungs every 6 (six) hours as needed for wheezing or shortness of breath. 54 Inhaler 3  . aspirin EC 81 MG tablet Take 1 tablet (81 mg total) by mouth daily. 90 tablet 3    . atorvastatin (LIPITOR) 80 MG tablet Take 1 tablet (80 mg total) by mouth daily. 90 tablet 1  . clopidogrel (PLAVIX) 75 MG tablet Take 1 tablet (75 mg total) by mouth daily with breakfast. 30 tablet 6  . docusate sodium (COLACE) 100 MG capsule Take 1 capsule (100 mg total) by mouth daily. 30 capsule 1  . DULoxetine (CYMBALTA) 60 MG capsule Take 1 capsule (60 mg total) by mouth daily. 30 capsule 6  . Fluticasone-Salmeterol (ADVAIR) 100-50 MCG/DOSE AEPB Inhale 1 puff into the lungs 2 (two) times daily. 180 each 1  . hydrocortisone cream 0.5 % Apply 1 application topically 2 (two) times daily. 30 g 0  . Insulin Glargine (LANTUS SOLOSTAR) 100 UNIT/ML Solostar Pen Inject 25 Units into the skin at bedtime. 5 pen 5  . Insulin Pen Needle (B-D UF III MINI PEN NEEDLES) 31G X 5 MM MISC Use as directed 100 each 2  . lisinopril (PRINIVIL,ZESTRIL) 5 MG tablet Take 1 tablet (5 mg total) by mouth daily. 30 tablet 6  . metFORMIN (GLUCOPHAGE) 500 MG tablet Take 1 tablet (500 mg total) by mouth 2 (two) times daily. 60 tablet 6  . metoprolol tartrate (LOPRESSOR) 25 MG tablet Take 0.5 tablets (12.5 mg total) by mouth 2 (two) times daily. 30 tablet 6  . oxybutynin (DITROPAN) 5 MG tablet Take 1 tablet (5 mg total) by mouth 2 (two) times daily. 60 tablet 6  . pantoprazole (PROTONIX) 20 MG tablet Take 1 tablet (20 mg total) by mouth daily. 30 tablet 5  . SANTYL ointment Apply 1 application topically daily.  0  . furosemide (LASIX) 40 MG tablet Take 1 tablet (40 mg total) by mouth 2 (two) times daily. 60 tablet 0  . HYDROcodone-acetaminophen (NORCO/VICODIN) 5-325 MG tablet Take 1 tablet by mouth every 6 (six) hours as needed for moderate pain. 20 tablet 0  . pregabalin (LYRICA) 100 MG capsule Take 1 capsule (100 mg total) by mouth 3 (three) times daily. 90 capsule 0   No facility-administered medications prior to visit.     ROS Review of Systems  Constitutional: Negative for activity change, appetite change and  fatigue.  HENT: Negative for congestion, sinus pressure and sore throat.   Eyes: Negative for visual disturbance.  Respiratory: Negative for cough, chest tightness, shortness of breath and wheezing.   Cardiovascular: Negative for chest pain and palpitations.  Gastrointestinal: Negative for abdominal distention, abdominal pain and constipation.  Endocrine: Negative for polydipsia.  Genitourinary: Negative for dysuria and frequency.  Musculoskeletal: Negative for arthralgias.       See hpi  Skin: Positive for wound. Negative for rash.  Neurological: Positive for numbness. Negative for tremors and light-headedness.  Hematological: Does not bruise/bleed easily.  Psychiatric/Behavioral: Negative for agitation and behavioral problems.    Objective:  BP 130/63   Pulse (!) 58   Temp 97.8 F (36.6 C) (Oral)  Ht 5\' 2"  (1.575 m)   Wt 140 lb (63.5 kg)   BMI 25.61 kg/m   BP/Weight 01/22/2017 01/15/2017 01/01/2017  Systolic BP 130 125 135  Diastolic BP 63 65 41  Wt. (Lbs) 140 139 132.6  BMI 25.61 25.42 24.25    Wt Readings from Last 3 Encounters:  01/22/17 140 lb (63.5 kg)  01/15/17 139 lb (63 kg)  01/01/17 132 lb 9.6 oz (60.1 kg)     Physical Exam  Constitutional: She is oriented to person, place, and time. She appears well-developed and well-nourished.  Cardiovascular: Normal rate, normal heart sounds and intact distal pulses.  No murmur heard. Pulmonary/Chest: Effort normal and breath sounds normal. She has no wheezes. She has no rales. She exhibits no tenderness.  Abdominal: Soft. Bowel sounds are normal. She exhibits no distension and no mass. There is no tenderness.  Musculoskeletal:  Left transmetatarsal amputation with surgical incision healing and lateral aspect of incision with open wound, foul odor, minimal discharge. Unable to dorsalis pedis and  posterior tibialis  Neurological: She is alert and oriented to person, place, and time.  Psychiatric: She has a normal mood  and affect.    Lab Results  Component Value Date   HGBA1C 7.2 (H) 11/19/2016    Assessment & Plan:   1. Type 2 diabetes mellitus with peripheral neuropathy (HCC) Controlled with A1c of 7.2 Continue current regimen of metformin and Lantus Counseled on diabetic diet - POCT glucose (manual entry)  2. Other diabetic neurological complication associated with type 2 diabetes mellitus (HCC) Doing well on Lyrica - pregabalin (LYRICA) 100 MG capsule; Take 1 capsule (100 mg total) by mouth 3 (three) times daily.  Dispense: 90 capsule; Refill: 3  3. Status post transmetatarsal amputation of foot, left (HCC) Wound dressing performed in the clinic Continue daily wound dressing changes at home  4. Overactive bladder Controlled on oxybutynin  5. Acute diastolic congestive heart failure (HCC) EF 50-55%, diffuse hypokinesis from echocardiogram of 12/2016 Acute exacerbation resolved Weight is up 8 pounds since discharge 2 weeks ago Advised on fluid restriction, low-sodium diet Continue Lasix, ACE inhibitor, beta-blocker - furosemide (LASIX) 40 MG tablet; Take 1 tablet (40 mg total) by mouth 2 (two) times daily.  Dispense: 60 tablet; Refill: 3  6. PAD (peripheral artery disease) (HCC) Risk factor modification Continue statin, Plavix  7. Pain in both hands Likely underlying osteoarthritis Unable to place an oral NSAIDs due to being on Plavix - diclofenac sodium (VOLTAREN) 1 % GEL; Apply 4 g topically 4 (four) times daily.  Dispense: 100 g; Refill: 1  8. Acute cystitis without hematuria - POCT urinalysis dipstick - sulfamethoxazole-trimethoprim (BACTRIM DS,SEPTRA DS) 800-160 MG tablet; Take 1 tablet by mouth 2 (two) times daily.  Dispense: 20 tablet; Refill: 0   Meds ordered this encounter  Medications  . pregabalin (LYRICA) 100 MG capsule    Sig: Take 1 capsule (100 mg total) by mouth 3 (three) times daily.    Dispense:  90 capsule    Refill:  3  . furosemide (LASIX) 40 MG tablet      Sig: Take 1 tablet (40 mg total) by mouth 2 (two) times daily.    Dispense:  60 tablet    Refill:  3  . diclofenac sodium (VOLTAREN) 1 % GEL    Sig: Apply 4 g topically 4 (four) times daily.    Dispense:  100 g    Refill:  1  . sulfamethoxazole-trimethoprim (BACTRIM DS,SEPTRA DS) 800-160 MG tablet  Sig: Take 1 tablet by mouth 2 (two) times daily.    Dispense:  20 tablet    Refill:  0    Follow-up: Return in about 6 weeks (around 03/05/2017) for follow up on diabetes mellitus.   Jaclyn Shaggy MD

## 2017-01-22 NOTE — Patient Instructions (Signed)
Diabetes Mellitus and Nutrition When you have diabetes (diabetes mellitus), it is very important to have healthy eating habits because your blood sugar (glucose) levels are greatly affected by what you eat and drink. Eating healthy foods in the appropriate amounts, at about the same times every day, can help you:  Control your blood glucose.  Lower your risk of heart disease.  Improve your blood pressure.  Reach or maintain a healthy weight.  Every person with diabetes is different, and each person has different needs for a meal plan. Your health care provider may recommend that you work with a diet and nutrition specialist (dietitian) to make a meal plan that is best for you. Your meal plan may vary depending on factors such as:  The calories you need.  The medicines you take.  Your weight.  Your blood glucose, blood pressure, and cholesterol levels.  Your activity level.  Other health conditions you have, such as heart or kidney disease.  How do carbohydrates affect me? Carbohydrates affect your blood glucose level more than any other type of food. Eating carbohydrates naturally increases the amount of glucose in your blood. Carbohydrate counting is a method for keeping track of how many carbohydrates you eat. Counting carbohydrates is important to keep your blood glucose at a healthy level, especially if you use insulin or take certain oral diabetes medicines. It is important to know how many carbohydrates you can safely have in each meal. This is different for every person. Your dietitian can help you calculate how many carbohydrates you should have at each meal and for snack. Foods that contain carbohydrates include:  Bread, cereal, rice, pasta, and crackers.  Potatoes and corn.  Peas, beans, and lentils.  Milk and yogurt.  Fruit and juice.  Desserts, such as cakes, cookies, ice cream, and candy.  How does alcohol affect me? Alcohol can cause a sudden decrease in blood  glucose (hypoglycemia), especially if you use insulin or take certain oral diabetes medicines. Hypoglycemia can be a life-threatening condition. Symptoms of hypoglycemia (sleepiness, dizziness, and confusion) are similar to symptoms of having too much alcohol. If your health care provider says that alcohol is safe for you, follow these guidelines:  Limit alcohol intake to no more than 1 drink per day for nonpregnant women and 2 drinks per day for men. One drink equals 12 oz of beer, 5 oz of wine, or 1 oz of hard liquor.  Do not drink on an empty stomach.  Keep yourself hydrated with water, diet soda, or unsweetened iced tea.  Keep in mind that regular soda, juice, and other mixers may contain a lot of sugar and must be counted as carbohydrates.  What are tips for following this plan? Reading food labels  Start by checking the serving size on the label. The amount of calories, carbohydrates, fats, and other nutrients listed on the label are based on one serving of the food. Many foods contain more than one serving per package.  Check the total grams (g) of carbohydrates in one serving. You can calculate the number of servings of carbohydrates in one serving by dividing the total carbohydrates by 15. For example, if a food has 30 g of total carbohydrates, it would be equal to 2 servings of carbohydrates.  Check the number of grams (g) of saturated and trans fats in one serving. Choose foods that have low or no amount of these fats.  Check the number of milligrams (mg) of sodium in one serving. Most people   should limit total sodium intake to less than 2,300 mg per day.  Always check the nutrition information of foods labeled as "low-fat" or "nonfat". These foods may be higher in added sugar or refined carbohydrates and should be avoided.  Talk to your dietitian to identify your daily goals for nutrients listed on the label. Shopping  Avoid buying canned, premade, or processed foods. These  foods tend to be high in fat, sodium, and added sugar.  Shop around the outside edge of the grocery store. This includes fresh fruits and vegetables, bulk grains, fresh meats, and fresh dairy. Cooking  Use low-heat cooking methods, such as baking, instead of high-heat cooking methods like deep frying.  Cook using healthy oils, such as olive, canola, or sunflower oil.  Avoid cooking with butter, cream, or high-fat meats. Meal planning  Eat meals and snacks regularly, preferably at the same times every day. Avoid going long periods of time without eating.  Eat foods high in fiber, such as fresh fruits, vegetables, beans, and whole grains. Talk to your dietitian about how many servings of carbohydrates you can eat at each meal.  Eat 4-6 ounces of lean protein each day, such as lean meat, chicken, fish, eggs, or tofu. 1 ounce is equal to 1 ounce of meat, chicken, or fish, 1 egg, or 1/4 cup of tofu.  Eat some foods each day that contain healthy fats, such as avocado, nuts, seeds, and fish. Lifestyle   Check your blood glucose regularly.  Exercise at least 30 minutes 5 or more days each week, or as told by your health care provider.  Take medicines as told by your health care provider.  Do not use any products that contain nicotine or tobacco, such as cigarettes and e-cigarettes. If you need help quitting, ask your health care provider.  Work with a counselor or diabetes educator to identify strategies to manage stress and any emotional and social challenges. What are some questions to ask my health care provider?  Do I need to meet with a diabetes educator?  Do I need to meet with a dietitian?  What number can I call if I have questions?  When are the best times to check my blood glucose? Where to find more information:  American Diabetes Association: diabetes.org/food-and-fitness/food  Academy of Nutrition and Dietetics:  www.eatright.org/resources/health/diseases-and-conditions/diabetes  National Institute of Diabetes and Digestive and Kidney Diseases (NIH): www.niddk.nih.gov/health-information/diabetes/overview/diet-eating-physical-activity Summary  A healthy meal plan will help you control your blood glucose and maintain a healthy lifestyle.  Working with a diet and nutrition specialist (dietitian) can help you make a meal plan that is best for you.  Keep in mind that carbohydrates and alcohol have immediate effects on your blood glucose levels. It is important to count carbohydrates and to use alcohol carefully. This information is not intended to replace advice given to you by your health care provider. Make sure you discuss any questions you have with your health care provider. Document Released: 09/25/2004 Document Revised: 02/03/2016 Document Reviewed: 02/03/2016 Elsevier Interactive Patient Education  2018 [employer]  

## 2017-01-25 ENCOUNTER — Encounter: Payer: Self-pay | Admitting: Pharmacist

## 2017-01-25 NOTE — Progress Notes (Signed)
PA submitted and approved for diclofenac gel. Approved through 01/11/2038.

## 2017-02-26 ENCOUNTER — Ambulatory Visit: Payer: BLUE CROSS/BLUE SHIELD | Admitting: Family Medicine

## 2017-03-05 ENCOUNTER — Ambulatory Visit: Payer: BLUE CROSS/BLUE SHIELD | Admitting: Family Medicine

## 2017-03-05 MED FILL — FUROSEMIDE 40 MG TAB: 40 | 30 days supply | Qty: 60 | Fill #1

## 2017-03-05 MED FILL — DULoxetine HCL 60 MG CPEP: 60 | 30 days supply | Qty: 30 | Fill #2

## 2017-03-05 MED FILL — LANTUS SOLOSTAR 100 UNITS/M: 100 | 30 days supply | Qty: 9 | Fill #3

## 2017-03-05 MED FILL — ATORVASTATIN 80 MG TABLET: 80 | 90 days supply | Qty: 90 | Fill #1

## 2017-03-09 ENCOUNTER — Ambulatory Visit: Payer: BLUE CROSS/BLUE SHIELD | Admitting: Family Medicine

## 2017-03-10 ENCOUNTER — Encounter: Payer: Self-pay | Admitting: Family Medicine

## 2017-03-10 ENCOUNTER — Ambulatory Visit: Payer: BLUE CROSS/BLUE SHIELD | Attending: Family Medicine | Admitting: Family Medicine

## 2017-03-10 VITALS — BP 146/64 | Temp 97.9°F | Ht 62.0 in | Wt 149.2 lb

## 2017-03-10 DIAGNOSIS — Z89432 Acquired absence of left foot: Secondary | ICD-10-CM | POA: Insufficient documentation

## 2017-03-10 DIAGNOSIS — Z79899 Other long term (current) drug therapy: Secondary | ICD-10-CM | POA: Diagnosis not present

## 2017-03-10 DIAGNOSIS — J0120 Acute ethmoidal sinusitis, unspecified: Secondary | ICD-10-CM | POA: Insufficient documentation

## 2017-03-10 DIAGNOSIS — E1169 Type 2 diabetes mellitus with other specified complication: Secondary | ICD-10-CM | POA: Diagnosis not present

## 2017-03-10 DIAGNOSIS — Z9889 Other specified postprocedural states: Secondary | ICD-10-CM | POA: Diagnosis not present

## 2017-03-10 DIAGNOSIS — I5031 Acute diastolic (congestive) heart failure: Secondary | ICD-10-CM | POA: Diagnosis not present

## 2017-03-10 DIAGNOSIS — I10 Essential (primary) hypertension: Secondary | ICD-10-CM

## 2017-03-10 DIAGNOSIS — Z794 Long term (current) use of insulin: Secondary | ICD-10-CM

## 2017-03-10 DIAGNOSIS — E1142 Type 2 diabetes mellitus with diabetic polyneuropathy: Secondary | ICD-10-CM | POA: Diagnosis not present

## 2017-03-10 DIAGNOSIS — Z7982 Long term (current) use of aspirin: Secondary | ICD-10-CM | POA: Insufficient documentation

## 2017-03-10 DIAGNOSIS — I110 Hypertensive heart disease with heart failure: Secondary | ICD-10-CM | POA: Insufficient documentation

## 2017-03-10 LAB — POCT GLYCOSYLATED HEMOGLOBIN (HGB A1C): HEMOGLOBIN A1C: 9.2

## 2017-03-10 LAB — GLUCOSE, POCT (MANUAL RESULT ENTRY): POC GLUCOSE: 307 mg/dL — AB (ref 70–99)

## 2017-03-10 MED ORDER — AMOXICILLIN 500 MG PO CAPS: 500.0000 mg | ORAL_CAPSULE | Freq: Three times a day (TID) | ORAL | 0 refills | Status: AC

## 2017-03-10 MED ORDER — FUROSEMIDE 40 MG PO TABS: 40.0000 mg | ORAL_TABLET | Freq: Every day | ORAL | 3 refills | Status: AC

## 2017-03-10 MED ORDER — INSULIN GLARGINE 100 UNIT/ML SOLOSTAR PEN: 30.0000 [IU] | PEN_INJECTOR | Freq: Every day | SUBCUTANEOUS | 5 refills | Status: AC

## 2017-03-10 MED ORDER — GABAPENTIN 300 MG PO CAPS: 600.0000 mg | ORAL_CAPSULE | Freq: Two times a day (BID) | ORAL | 3 refills | Status: AC

## 2017-03-10 MED FILL — AMOXICILLIN 500 MG CAPSULE: 500 | 10 days supply | Qty: 30 | Fill #0

## 2017-03-10 MED FILL — GABAPENTIN 300 MG CAPSULE: 300 | 30 days supply | Qty: 120 | Fill #0

## 2017-03-10 NOTE — Progress Notes (Signed)
Subjective:  Patient ID: Andrea [name], female    DOB: 1950-09-03  Age: 68 y.o. MRN: [id number]  CC: Diabetes   HPI KEYERA HATTABAUGH is a 67 year old female with a history of type 2 diabetes mellitus (A1c 9.2), hypertension,Coronary artery disease status post CABG in 2006, status post stent in 2000  peripheral vascular disease status post bilateral  common femoral endarterectomy and left femoral to Low knee popliteal artery bypass, left transmetatarsal amputation in 11/2016 who presents today for follow-up visit.  She complains of sinus pain on the bridge of her nose bilaterally "my sinuses are giving me a fit" with associated nasal congestion and difficulty blowing her nose ('stuff won't come out").  Also endorses postnasal drip, cough productive of whitish sputum but no fever.  Her A1c is 9.2 today and this has trended up from 7.2 previously and she endorses compliance with her Lantus and a diabetic diet.  Denies visual concerns but endorses neuropathy and complains of the co-pay associated with Lyrica.  She was previously on Neurontin in the past but her neuropathy was uncontrolled on it. With regards to her depression and anxiety she feels more energetic this week and has been compliant with Cymbalta.  Left foot is doing well and she has an upcoming appointment with vascular-Dr. Randie Heinz in 04/2017. Denies pedal swelling, shortness of breath or chest pain.  Past Medical History:  Diagnosis Date  . Arthritis   . Asthma   . Coronary artery disease   . Diabetes mellitus without complication (HCC)   . Dyspnea   . Fibromyalgia   . GERD (gastroesophageal reflux disease)   . High cholesterol   . Hypertension   . 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 PONV (postoperative nausea and vomiting)   . PVD (peripheral vascular disease) (HCC)   . Urinary frequency   . Urinary incontinence     Past Surgical History:  Procedure Laterality Date  . ABDOMINAL AORTOGRAM W/LOWER  EXTREMITY N/A 03/25/2016   Procedure: Abdominal Aortogram w/Lower Extremity;  Surgeon: Maeola Harman, MD;  Location: [facility] INVASIVE CV LAB;  Service: Cardiovascular;  Laterality: N/A;  . ABDOMINAL AORTOGRAM W/LOWER EXTREMITY N/A 10/12/2016   Procedure: ABDOMINAL AORTOGRAM W/LOWER EXTREMITY;  Surgeon: Maeola Harman, MD;  Location: [facility] INVASIVE CV LAB;  Service: Cardiovascular;  Laterality: N/A;  . ABDOMINAL HYSTERECTOMY    . AMPUTATION TOE Left 10/13/2016   Procedure: AMPUTATION LEFT GREAT TOE AND SECOND TOE;  Surgeon: Maeola Harman, MD;  Location: [facility] OR;  Service: Vascular;  Laterality: Left;  . AORTOGRAM N/A 09/16/2016   Procedure: AORTOGRAM WITH LEFT LOWER EXTREMITY RUNOFF;  Surgeon: Sherren Kerns, MD;  Location: [facility] OR;  Service: Vascular;  Laterality: N/A;  . APPLICATION OF WOUND VAC Right 06/14/2016   Procedure: APPLICATION OF WOUND VAC;  Surgeon: Sherren Kerns, MD;  Location: [facility] OR;  Service: Vascular;  Laterality: Right;  . COLONOSCOPY    . CORONARY ARTERY BYPASS GRAFT    . CORONARY STENT INTERVENTION N/A 04/15/2016   Procedure: Coronary Stent Intervention;  Surgeon: Lennette Bihari, MD;  Location: [facility] INVASIVE CV LAB;  Service: Cardiovascular;  Laterality: N/A;  . ENDARTERECTOMY FEMORAL Right 05/21/2016   Procedure: RIGHT FEMORAL ENDARTERECTOMY;  Surgeon: Maeola Harman, MD;  Location: [facility] OR;  Service: Vascular;  Laterality: Right;  . ENDARTERECTOMY FEMORAL Left 07/20/2016   Procedure: LEFT FEMORAL ENDARTERECTOMY;  Surgeon: Maeola Harman, MD;  Location: [facility] OR;  Service: Vascular;  Laterality:  Left;  . FEMORAL BYPASS Left 07/20/2016   Left common femoral endarterectomy  . FEMORAL-POPLITEAL BYPASS GRAFT Right 05/21/2016   Procedure: RIGHT FEMORAL-BELOW KNEE POPLITEAL ARTERY BYPASS GRAFT USING PROPATEN GORETEX GRAFT;  Surgeon: Maeola Harman, MD;  Location: [facility] OR;  Service: Vascular;  Laterality: Right;  . FEMORAL-POPLITEAL BYPASS  GRAFT Left 07/20/2016   Procedure: LEFT FEMORAL TO BELOW KNEE POPLITEAL ARTERY  BYPASS GRAFT;  Surgeon: Maeola Harman, MD;  Location: [facility] OR;  Service: Vascular;  Laterality: Left;  . GROIN DEBRIDEMENT Right 06/14/2016   Procedure: GROIN DEBRIDEMENT;  Surgeon: Sherren Kerns, MD;  Location: [facility] OR;  Service: Vascular;  Laterality: Right;  . I&D EXTREMITY N/A 09/12/2016   Procedure: Irrigation and Debridment Left Groin; Closure of Distal Incisions Left Leg; Medial Calf and Medial Thigh;  Surgeon: Maeola Harman, MD;  Location: [facility] OR;  Service: Vascular;  Laterality: N/A;  . KNEE SURGERY    . LEFT HEART CATH AND CORS/GRAFTS ANGIOGRAPHY N/A 04/15/2016   Procedure: Left Heart Cath and Cors/Grafts Angiography;  Surgeon: Lennette Bihari, MD;  Location: [facility] INVASIVE CV LAB;  Service: Cardiovascular;  Laterality: N/A;  . MULTIPLE TOOTH EXTRACTIONS    . MUSCLE FLAP CLOSURE Left 09/18/2016   Procedure: LEFT GRACILIS MUSCLE FLAP TO LEFT GROIN;  Surgeon: Glenna Fellows, MD;  Location: [facility] OR;  Service: Plastics;  Laterality: Left;  . PATCH ANGIOPLASTY Left 09/10/2016   Procedure: Bovine PATCH ANGIOPLASTY Left Femoral Artery and Below Knee Popliteal Artery;  Surgeon: Fransisco Hertz, MD;  Location: [facility] OR;  Service: Vascular;  Laterality: Left;  . PERIPHERAL VASCULAR INTERVENTION Left 10/12/2016   Procedure: PERIPHERAL VASCULAR INTERVENTION;  Surgeon: Maeola Harman, MD;  Location: [facility] INVASIVE CV LAB;  Service: Cardiovascular;  Laterality: Left;  . REMOVAL OF GRAFT Left 09/10/2016   Procedure: Excision of Left Femoral-Popliteal Graft, Placement of Antibiotic Beads Left Leg;  Surgeon: Fransisco Hertz, MD;  Location: [facility] OR;  Service: Vascular;  Laterality: Left;  . TRANSMETATARSAL AMPUTATION Left 11/19/2016   Procedure: TRANSMETATARSAL AMPUTATION;  Surgeon: Maeola Harman, MD;  Location: [facility] OR;  Service: Vascular;  Laterality: Left;    Allergies  Allergen Reactions  . Naproxen  Itching  . Tramadol Hcl Itching    Outpatient Medications Prior to Visit  Medication Sig Dispense Refill  . acetaminophen (TYLENOL) 325 MG tablet Take 2 tablets (650 mg total) by mouth every 6 (six) hours as needed for mild pain.    Marland Kitchen albuterol (PROVENTIL HFA;VENTOLIN HFA) 108 (90 Base) MCG/ACT inhaler Inhale 2 puffs into the lungs every 6 (six) hours as needed for wheezing or shortness of breath. 54 Inhaler 3  . aspirin EC 81 MG tablet Take 1 tablet (81 mg total) by mouth daily. 90 tablet 3  . atorvastatin (LIPITOR) 80 MG tablet Take 1 tablet (80 mg total) by mouth daily. 90 tablet 1  . clopidogrel (PLAVIX) 75 MG tablet Take 1 tablet (75 mg total) by mouth daily with breakfast. 30 tablet 6  . diclofenac sodium (VOLTAREN) 1 % GEL Apply 4 g topically 4 (four) times daily. 100 g 1  . docusate sodium (COLACE) 100 MG capsule Take 1 capsule (100 mg total) by mouth daily. 30 capsule 1  . DULoxetine (CYMBALTA) 60 MG capsule Take 1 capsule (60 mg total) by mouth daily. 30 capsule 6  . Fluticasone-Salmeterol (ADVAIR) 100-50 MCG/DOSE AEPB Inhale 1 puff into the lungs 2 (two) times daily. 180 each 1  . hydrocortisone cream 0.5 % Apply  1 application topically 2 (two) times daily. 30 g 0  . Insulin Pen Needle (B-D UF III MINI PEN NEEDLES) 31G X 5 MM MISC Use as directed 100 each 2  . lisinopril (PRINIVIL,ZESTRIL) 5 MG tablet Take 1 tablet (5 mg total) by mouth daily. 30 tablet 6  . metFORMIN (GLUCOPHAGE) 500 MG tablet Take 1 tablet (500 mg total) by mouth 2 (two) times daily. 60 tablet 6  . metoprolol tartrate (LOPRESSOR) 25 MG tablet Take 0.5 tablets (12.5 mg total) by mouth 2 (two) times daily. 30 tablet 6  . oxybutynin (DITROPAN) 5 MG tablet Take 1 tablet (5 mg total) by mouth 2 (two) times daily. 60 tablet 6  . pantoprazole (PROTONIX) 20 MG tablet Take 1 tablet (20 mg total) by mouth daily. 30 tablet 5  . SANTYL ointment Apply 1 application topically daily.  0  . Insulin Glargine (LANTUS SOLOSTAR) 100  UNIT/ML Solostar Pen Inject 25 Units into the skin at bedtime. 5 pen 5  . pregabalin (LYRICA) 100 MG capsule Take 1 capsule (100 mg total) by mouth 3 (three) times daily. 90 capsule 3  . sulfamethoxazole-trimethoprim (BACTRIM DS,SEPTRA DS) 800-160 MG tablet Take 1 tablet by mouth 2 (two) times daily. (Patient not taking: Reported on 03/10/2017) 20 tablet 0  . furosemide (LASIX) 40 MG tablet Take 1 tablet (40 mg total) by mouth 2 (two) times daily. (Patient not taking: Reported on 03/10/2017) 60 tablet 3   No facility-administered medications prior to visit.     ROS Review of Systems  Constitutional: Negative for activity change, appetite change and fatigue.  HENT: Positive for postnasal drip, sinus pressure and sinus pain. Negative for congestion and sore throat.   Eyes: Negative for visual disturbance.  Respiratory: Positive for cough. Negative for chest tightness, shortness of breath and wheezing.   Cardiovascular: Negative for chest pain and palpitations.  Gastrointestinal: Negative for abdominal distention, abdominal pain and constipation.  Endocrine: Negative for polydipsia.  Genitourinary: Negative for dysuria and frequency.  Musculoskeletal:       See hpi  Skin: Negative for rash.  Neurological: Positive for numbness. Negative for tremors and light-headedness.  Hematological: Does not bruise/bleed easily.  Psychiatric/Behavioral: Negative for agitation and behavioral problems.    Objective:  BP (!) 146/64   Temp 97.9 F (36.6 C) (Oral)   Ht 5\' 2"  (1.575 m)   Wt 149 lb 3.2 oz (67.7 kg)   BMI 27.29 kg/m   BP/Weight 03/10/2017 01/22/2017 01/15/2017  Systolic BP 146 130 125  Diastolic BP 64 63 65  Wt. (Lbs) 149.2 140 139  BMI 27.29 25.61 25.42      Physical Exam  Constitutional: She is oriented to person, place, and time. She appears well-developed and well-nourished.  Cardiovascular: Normal rate and normal heart sounds.  No murmur heard. Pulmonary/Chest: Effort normal  and breath sounds normal. She has no wheezes. She has no rales. She exhibits no tenderness.  Abdominal: Soft. Bowel sounds are normal. She exhibits no distension and no mass. There is no tenderness.  Musculoskeletal:  Left transmetatarsal amputation. Unable to palpate bilateral dorsalis pedis  Neurological: She is alert and oriented to person, place, and time.  Skin: Skin is warm and dry.  Psychiatric: She has a normal mood and affect.    CMP Latest Ref Rng & Units 01/01/2017 12/31/2016 12/30/2016  Glucose 65 - 99 mg/dL 161(W158(H) 960(A205(H) 540(J275(H)  BUN 6 - 20 mg/dL 81(X25(H) 91(Y22(H) 20  Creatinine 0.44 - 1.00 mg/dL 7.820.89 9.561.00 2.130.79  Sodium  135 - 145 mmol/L 138 140 138  Potassium 3.5 - 5.1 mmol/L 3.8 4.1 3.9  Chloride 101 - 111 mmol/L 104 105 105  CO2 22 - 32 mmol/L 27 26 24   Calcium 8.9 - 10.3 mg/dL 8.9 9.0 4.0(J)  Total Protein 6.5 - 8.1 g/dL - - -  Total Bilirubin 0.3 - 1.2 mg/dL - - -  Alkaline Phos 38 - 126 U/L - - -  AST 15 - 41 U/L - - -  ALT 14 - 54 U/L - - -     Lab Results  Component Value Date   HGBA1C 9.2 03/10/2017     Assessment & Plan:   1. Type 2 diabetes mellitus with other specified complication with long-term use of insulin (HCC) Uncontrolled with A1c of 9.2 which has trended up from 7.2 previously Increase dose of Lantus Advised to comply with a diabetic diet - POCT glucose (manual entry) - POCT glycosylated hemoglobin (Hb A1C) - gabapentin (NEURONTIN) 300 MG capsule; Take 2 capsules (600 mg total) by mouth 2 (two) times daily.  Dispense: 120 capsule; Refill: 3  2. Acute diastolic congestive heart failure (HCC) EF 50-55% from 12/2016 Euvolemic - furosemide (LASIX) 40 MG tablet; Take 1 tablet (40 mg total) by mouth daily.  Dispense: 30 tablet; Refill: 3  3. DM type 2 with diabetic peripheral neuropathy (HCC) Switched from Lyrica to gabapentin  as per patient request due to cost Neuropathy was previously uncontrolled on gabapentin - gabapentin (NEURONTIN) 300 MG  capsule; Take 2 capsules (600 mg total) by mouth 2 (two) times daily.  Dispense: 120 capsule; Refill: 3 - Insulin Glargine (LANTUS SOLOSTAR) 100 UNIT/ML Solostar Pen; Inject 30 Units into the skin at bedtime.  Dispense: 5 pen; Refill: 5  4. Essential hypertension Slightly elevated No regimen change today Counseled on blood pressure goal of less than 130/80, low-sodium, DASH diet, medication compliance, 150 minutes of moderate intensity exercise per week. Discussed medication compliance, adverse effects.   5. Status post transmetatarsal amputation of foot, left (HCC) Stable Follow-up with Dr. Randie Heinz of vascular  6. Acute non-recurrent ethmoidal sinusitis - amoxicillin (AMOXIL) 500 MG capsule; Take 1 capsule (500 mg total) by mouth 3 (three) times daily.  Dispense: 30 capsule; Refill: 0   Meds ordered this encounter  Medications  . gabapentin (NEURONTIN) 300 MG capsule    Sig: Take 2 capsules (600 mg total) by mouth 2 (two) times daily.    Dispense:  120 capsule    Refill:  3    Discontinue Lyrica due to cost  . amoxicillin (AMOXIL) 500 MG capsule    Sig: Take 1 capsule (500 mg total) by mouth 3 (three) times daily.    Dispense:  30 capsule    Refill:  0  . Insulin Glargine (LANTUS SOLOSTAR) 100 UNIT/ML Solostar Pen    Sig: Inject 30 Units into the skin at bedtime.    Dispense:  5 pen    Refill:  5    Discontinue previous dose  . furosemide (LASIX) 40 MG tablet    Sig: Take 1 tablet (40 mg total) by mouth daily.    Dispense:  30 tablet    Refill:  3    Follow-up: Return in about 3 months (around 06/07/2017) for follow up of chronic medical conditions.   Hoy Register MD

## 2017-03-11 ENCOUNTER — Encounter: Payer: Self-pay | Admitting: Family Medicine

## 2017-03-29 ENCOUNTER — Other Ambulatory Visit: Payer: Self-pay | Admitting: Family Medicine

## 2017-03-29 DIAGNOSIS — I739 Peripheral vascular disease, unspecified: Secondary | ICD-10-CM

## 2017-03-29 MED FILL — FUROSEMIDE 40 MG TAB: 40 | 30 days supply | Qty: 60 | Fill #2

## 2017-03-29 MED FILL — LISINOPRIL 5 MG TAB: 5 | 30 days supply | Qty: 30 | Fill #0

## 2017-03-29 MED FILL — METOPROLOL TARTRATE 25 MG T: 25 | 30 days supply | Qty: 30 | Fill #1

## 2017-03-29 MED FILL — OXYBUTYNIN 5 MG TABLET: 5 | 30 days supply | Qty: 60 | Fill #1

## 2017-03-29 MED FILL — PANTOPRAZOLE SOD DR 20 MG T: 20 | 30 days supply | Qty: 30 | Fill #0

## 2017-03-29 MED FILL — DULoxetine HCL 60 MG CPEP: 60 | 30 days supply | Qty: 30 | Fill #3

## 2017-04-16 ENCOUNTER — Encounter (HOSPITAL_COMMUNITY): Payer: BLUE CROSS/BLUE SHIELD

## 2017-04-16 ENCOUNTER — Inpatient Hospital Stay (HOSPITAL_COMMUNITY): Admission: RE | Admit: 2017-04-16 | Payer: BLUE CROSS/BLUE SHIELD | Source: Ambulatory Visit

## 2017-04-16 ENCOUNTER — Ambulatory Visit: Payer: BLUE CROSS/BLUE SHIELD | Admitting: Vascular Surgery

## 2017-05-21 ENCOUNTER — Inpatient Hospital Stay (HOSPITAL_COMMUNITY)
Admission: EM | Admit: 2017-05-21 | Discharge: 2017-06-12 | DRG: 252 | Disposition: E | Payer: BLUE CROSS/BLUE SHIELD | Attending: Vascular Surgery | Admitting: Vascular Surgery

## 2017-05-21 ENCOUNTER — Encounter (HOSPITAL_COMMUNITY): Payer: Self-pay | Admitting: Emergency Medicine

## 2017-05-21 ENCOUNTER — Other Ambulatory Visit: Payer: Self-pay

## 2017-05-21 DIAGNOSIS — E785 Hyperlipidemia, unspecified: Secondary | ICD-10-CM | POA: Diagnosis present

## 2017-05-21 DIAGNOSIS — Z515 Encounter for palliative care: Secondary | ICD-10-CM | POA: Diagnosis not present

## 2017-05-21 DIAGNOSIS — Z66 Do not resuscitate: Secondary | ICD-10-CM | POA: Diagnosis not present

## 2017-05-21 DIAGNOSIS — N179 Acute kidney failure, unspecified: Secondary | ICD-10-CM | POA: Diagnosis not present

## 2017-05-21 DIAGNOSIS — T82868A Thrombosis of vascular prosthetic devices, implants and grafts, initial encounter: Principal | ICD-10-CM | POA: Diagnosis present

## 2017-05-21 DIAGNOSIS — Z95820 Peripheral vascular angioplasty status with implants and grafts: Secondary | ICD-10-CM | POA: Diagnosis not present

## 2017-05-21 DIAGNOSIS — I708 Atherosclerosis of other arteries: Secondary | ICD-10-CM | POA: Diagnosis present

## 2017-05-21 DIAGNOSIS — J9601 Acute respiratory failure with hypoxia: Secondary | ICD-10-CM | POA: Diagnosis not present

## 2017-05-21 DIAGNOSIS — E872 Acidosis, unspecified: Secondary | ICD-10-CM

## 2017-05-21 DIAGNOSIS — D696 Thrombocytopenia, unspecified: Secondary | ICD-10-CM | POA: Diagnosis not present

## 2017-05-21 DIAGNOSIS — Z90710 Acquired absence of both cervix and uterus: Secondary | ICD-10-CM

## 2017-05-21 DIAGNOSIS — D62 Acute posthemorrhagic anemia: Secondary | ICD-10-CM | POA: Diagnosis not present

## 2017-05-21 DIAGNOSIS — Z419 Encounter for procedure for purposes other than remedying health state, unspecified: Secondary | ICD-10-CM

## 2017-05-21 DIAGNOSIS — M79671 Pain in right foot: Secondary | ICD-10-CM

## 2017-05-21 DIAGNOSIS — Z89432 Acquired absence of left foot: Secondary | ICD-10-CM

## 2017-05-21 DIAGNOSIS — Z452 Encounter for adjustment and management of vascular access device: Secondary | ICD-10-CM

## 2017-05-21 DIAGNOSIS — M79A21 Nontraumatic compartment syndrome of right lower extremity: Secondary | ICD-10-CM | POA: Diagnosis not present

## 2017-05-21 DIAGNOSIS — J45909 Unspecified asthma, uncomplicated: Secondary | ICD-10-CM | POA: Diagnosis present

## 2017-05-21 DIAGNOSIS — Z7902 Long term (current) use of antithrombotics/antiplatelets: Secondary | ICD-10-CM

## 2017-05-21 DIAGNOSIS — R579 Shock, unspecified: Secondary | ICD-10-CM | POA: Diagnosis not present

## 2017-05-21 DIAGNOSIS — I959 Hypotension, unspecified: Secondary | ICD-10-CM | POA: Diagnosis not present

## 2017-05-21 DIAGNOSIS — I10 Essential (primary) hypertension: Secondary | ICD-10-CM | POA: Diagnosis present

## 2017-05-21 DIAGNOSIS — I998 Other disorder of circulatory system: Secondary | ICD-10-CM | POA: Diagnosis present

## 2017-05-21 DIAGNOSIS — E1151 Type 2 diabetes mellitus with diabetic peripheral angiopathy without gangrene: Secondary | ICD-10-CM | POA: Diagnosis present

## 2017-05-21 DIAGNOSIS — Z87891 Personal history of nicotine dependence: Secondary | ICD-10-CM

## 2017-05-21 DIAGNOSIS — G9340 Encephalopathy, unspecified: Secondary | ICD-10-CM

## 2017-05-21 DIAGNOSIS — Y832 Surgical operation with anastomosis, bypass or graft as the cause of abnormal reaction of the patient, or of later complication, without mention of misadventure at the time of the procedure: Secondary | ICD-10-CM

## 2017-05-21 DIAGNOSIS — R6521 Severe sepsis with septic shock: Secondary | ICD-10-CM

## 2017-05-21 DIAGNOSIS — Z7952 Long term (current) use of systemic steroids: Secondary | ICD-10-CM

## 2017-05-21 DIAGNOSIS — Z951 Presence of aortocoronary bypass graft: Secondary | ICD-10-CM

## 2017-05-21 DIAGNOSIS — I739 Peripheral vascular disease, unspecified: Secondary | ICD-10-CM | POA: Diagnosis present

## 2017-05-21 DIAGNOSIS — A419 Sepsis, unspecified organism: Secondary | ICD-10-CM | POA: Diagnosis not present

## 2017-05-21 DIAGNOSIS — K219 Gastro-esophageal reflux disease without esophagitis: Secondary | ICD-10-CM | POA: Diagnosis present

## 2017-05-21 DIAGNOSIS — Z9289 Personal history of other medical treatment: Secondary | ICD-10-CM

## 2017-05-21 DIAGNOSIS — I25709 Atherosclerosis of coronary artery bypass graft(s), unspecified, with unspecified angina pectoris: Secondary | ICD-10-CM | POA: Diagnosis present

## 2017-05-21 DIAGNOSIS — J9691 Respiratory failure, unspecified with hypoxia: Secondary | ICD-10-CM

## 2017-05-21 DIAGNOSIS — T829XXA Unspecified complication of cardiac and vascular prosthetic device, implant and graft, initial encounter: Secondary | ICD-10-CM | POA: Diagnosis present

## 2017-05-21 DIAGNOSIS — M797 Fibromyalgia: Secondary | ICD-10-CM | POA: Diagnosis present

## 2017-05-21 DIAGNOSIS — Z794 Long term (current) use of insulin: Secondary | ICD-10-CM

## 2017-05-21 DIAGNOSIS — Z792 Long term (current) use of antibiotics: Secondary | ICD-10-CM

## 2017-05-21 DIAGNOSIS — Z7982 Long term (current) use of aspirin: Secondary | ICD-10-CM

## 2017-05-21 DIAGNOSIS — Z885 Allergy status to narcotic agent status: Secondary | ICD-10-CM

## 2017-05-21 DIAGNOSIS — Z7951 Long term (current) use of inhaled steroids: Secondary | ICD-10-CM

## 2017-05-21 DIAGNOSIS — Z888 Allergy status to other drugs, medicaments and biological substances status: Secondary | ICD-10-CM

## 2017-05-21 LAB — COMPREHENSIVE METABOLIC PANEL
ALBUMIN: 3.3 g/dL — AB (ref 3.5–5.0)
ALK PHOS: 75 U/L (ref 38–126)
ALT: 15 U/L (ref 14–54)
AST: 18 U/L (ref 15–41)
Anion gap: 10 (ref 5–15)
BUN: 25 mg/dL — AB (ref 6–20)
CALCIUM: 9 mg/dL (ref 8.9–10.3)
CHLORIDE: 102 mmol/L (ref 101–111)
CO2: 22 mmol/L (ref 22–32)
CREATININE: 0.87 mg/dL (ref 0.44–1.00)
GFR calc Af Amer: >60 mL/min (ref >60)
GFR calc non Af Amer: >60 mL/min (ref >60)
GLUCOSE: 474 mg/dL — AB (ref 65–99)
Potassium: 4.5 mmol/L (ref 3.5–5.1)
SODIUM: 134 mmol/L — AB (ref 135–145)
Total Bilirubin: 0.5 mg/dL (ref 0.3–1.2)
Total Protein: 6.4 g/dL — ABNORMAL LOW (ref 6.5–8.1)

## 2017-05-21 LAB — TYPE AND SCREEN
ABO/RH(D): A NEG
Antibody Screen: NEGATIVE

## 2017-05-21 LAB — CBC
HEMATOCRIT: 34.1 % — AB (ref 36.0–46.0)
Hemoglobin: 11.3 g/dL — ABNORMAL LOW (ref 12.0–15.0)
MCH: 30.1 pg (ref 26.0–34.0)
MCHC: 33.1 g/dL (ref 30.0–36.0)
MCV: 90.7 fL (ref 78.0–100.0)
PLATELETS: 228 10*3/uL (ref 150–400)
RBC: 3.76 MIL/uL — AB (ref 3.87–5.11)
RDW: 13.4 % (ref 11.5–15.5)
WBC: 7.2 10*3/uL (ref 4.0–10.5)

## 2017-05-21 LAB — PROTIME-INR
INR: 0.87
Prothrombin Time: 11.8 seconds (ref 11.4–15.2)

## 2017-05-21 MED ORDER — HEPARIN BOLUS VIA INFUSION
2000.0000 [IU] | Freq: Once | INTRAVENOUS | Status: AC
  Administered 2017-05-21: 2000 [IU] via INTRAVENOUS
  Filled 2017-05-21: qty 2000

## 2017-05-21 MED ORDER — HEPARIN (PORCINE) IN NACL 100-0.45 UNIT/ML-% IJ SOLN
1000.0000 [IU]/h | INTRAMUSCULAR | Status: DC
Start: 2017-05-21 — End: 2017-05-23
  Administered 2017-05-21: 850 [IU]/h via INTRAVENOUS
  Administered 2017-05-22: 1000 [IU]/h via INTRAVENOUS
  Filled 2017-05-21 (×2): qty 250

## 2017-05-21 MED FILL — CLOPIDOGREL 75 MG TABLET: 75 | 30 days supply | Qty: 30 | Fill #1

## 2017-05-21 MED FILL — FUROSEMIDE 40 MG TAB: 40 | 30 days supply | Qty: 60 | Fill #3

## 2017-05-21 MED FILL — LISINOPRIL 5 MG TAB: 5 | 30 days supply | Qty: 30 | Fill #1

## 2017-05-21 MED FILL — OXYBUTYNIN 5 MG TABLET: 5 | 30 days supply | Qty: 60 | Fill #2

## 2017-05-21 MED FILL — METOPROLOL TARTRATE 25 MG T: 25 | 30 days supply | Qty: 30 | Fill #2

## 2017-05-21 MED FILL — PANTOPRAZOLE SOD DR 20 MG T: 20 | 30 days supply | Qty: 30 | Fill #1

## 2017-05-21 MED FILL — DULoxetine HCL 60 MG CPEP: 60 | 30 days supply | Qty: 30 | Fill #0

## 2017-05-21 MED FILL — GABAPENTIN 300 MG CAPSULE: 300 | 30 days supply | Qty: 120 | Fill #1

## 2017-05-21 MED FILL — LANTUS SOLOSTAR 100 UNITS/M: 100 | 30 days supply | Qty: 9 | Fill #4

## 2017-05-21 NOTE — ED Provider Notes (Signed)
[facility] EMERGENCY DEPARTMENT Provider Note   CSN: [id number] Arrival date & time: 06/11/2017  2117     History   Chief Complaint Chief Complaint  Patient presents with  . Ankle Pain    HPI Andrea [name] is a 67 y.o. female presenting for evaluation of R distal leg pain.   Patient states she has had intermittent distal right leg pain for the past week, worse when she first wakes up and when she is walking around.  Today around 430 this afternoon, pain came worse and more constant.  It has been unrelieved since.  She has a significant vascular history including multiple vascular surgeries and endarterectomies.  She is on Plavix daily, although has not taken it for the past 2 days that she has run out.  She denies fevers, chills, cough, chest pain, shortness of breath, nausea, vomiting, abdominal pain.  She denies pain in the left leg.    HPI  Past Medical History:  Diagnosis Date  . Arthritis   . Asthma   . Coronary artery disease   . Diabetes mellitus without complication (HCC)   . Dyspnea   . Fibromyalgia   . GERD (gastroesophageal reflux disease)   . High cholesterol   . Hypertension   . 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 PONV (postoperative nausea and vomiting)   . PVD (peripheral vascular disease) (HCC)   . Urinary frequency   . Urinary incontinence     Patient Active Problem List   Diagnosis Date Noted  . (HFpEF) heart failure with preserved ejection fraction (HCC) 12/30/2016  . Acute diastolic congestive heart failure (HCC)   . Asthma 12/11/2016  . Type 2 diabetes mellitus with peripheral neuropathy (HCC)   . Status post transmetatarsal amputation of foot, left (HCC) 11/21/2016  . Acute blood loss anemia   . Fibromyalgia   . Postoperative pain   . 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 GERD (gastroesophageal reflux disease) 07/01/2016  . Adjustment reaction with anxiety and depression 07/01/2016  .  Coronary artery disease involving coronary bypass graft of native heart with angina pectoris (HCC)   . PAD (peripheral artery disease) (HCC) 03/30/2016  . Diabetic foot ulcers (HCC) 03/30/2016  . Hyperlipidemia 03/30/2016  . Diabetic neuropathy (HCC) 02/24/2016  . Tobacco abuse 02/24/2016  . Overactive bladder 01/14/2016  . Essential hypertension 10/18/2006  . LOW BACK PAIN, CHRONIC 10/18/2006    Past Surgical History:  Procedure Laterality Date  . ABDOMINAL AORTOGRAM W/LOWER EXTREMITY N/A 03/25/2016   Procedure: Abdominal Aortogram w/Lower Extremity;  Surgeon: Maeola Harman, MD;  Location: [facility] INVASIVE CV LAB;  Service: Cardiovascular;  Laterality: N/A;  . ABDOMINAL AORTOGRAM W/LOWER EXTREMITY N/A 10/12/2016   Procedure: ABDOMINAL AORTOGRAM W/LOWER EXTREMITY;  Surgeon: Maeola Harman, MD;  Location: [facility] INVASIVE CV LAB;  Service: Cardiovascular;  Laterality: N/A;  . ABDOMINAL HYSTERECTOMY    . AMPUTATION TOE Left 10/13/2016   Procedure: AMPUTATION LEFT GREAT TOE AND SECOND TOE;  Surgeon: Maeola Harman, MD;  Location: [facility] OR;  Service: Vascular;  Laterality: Left;  . AORTOGRAM N/A 09/16/2016   Procedure: AORTOGRAM WITH LEFT LOWER EXTREMITY RUNOFF;  Surgeon: Sherren Kerns, MD;  Location: [facility] OR;  Service: Vascular;  Laterality: N/A;  . APPLICATION OF WOUND VAC Right 06/14/2016   Procedure: APPLICATION OF WOUND VAC;  Surgeon: Sherren Kerns, MD;  Location: [facility] OR;  Service: Vascular;  Laterality: Right;  . COLONOSCOPY    .  CORONARY ARTERY BYPASS GRAFT    . CORONARY STENT INTERVENTION N/A 04/15/2016   Procedure: Coronary Stent Intervention;  Surgeon: Lennette Bihari, MD;  Location: [facility] INVASIVE CV LAB;  Service: Cardiovascular;  Laterality: N/A;  . ENDARTERECTOMY FEMORAL Right 05/21/2016   Procedure: RIGHT FEMORAL ENDARTERECTOMY;  Surgeon: Maeola Harman, MD;  Location: [facility] OR;  Service: Vascular;  Laterality: Right;  . ENDARTERECTOMY FEMORAL Left 07/20/2016    Procedure: LEFT FEMORAL ENDARTERECTOMY;  Surgeon: Maeola Harman, MD;  Location: [facility] OR;  Service: Vascular;  Laterality: Left;  . FEMORAL BYPASS Left 07/20/2016   Left common femoral endarterectomy  . FEMORAL-POPLITEAL BYPASS GRAFT Right 05/21/2016   Procedure: RIGHT FEMORAL-BELOW KNEE POPLITEAL ARTERY BYPASS GRAFT USING PROPATEN GORETEX GRAFT;  Surgeon: Maeola Harman, MD;  Location: [facility] OR;  Service: Vascular;  Laterality: Right;  . FEMORAL-POPLITEAL BYPASS GRAFT Left 07/20/2016   Procedure: LEFT FEMORAL TO BELOW KNEE POPLITEAL ARTERY  BYPASS GRAFT;  Surgeon: Maeola Harman, MD;  Location: [facility] OR;  Service: Vascular;  Laterality: Left;  . GROIN DEBRIDEMENT Right 06/14/2016   Procedure: GROIN DEBRIDEMENT;  Surgeon: Sherren Kerns, MD;  Location: [facility] OR;  Service: Vascular;  Laterality: Right;  . I&D EXTREMITY N/A 09/12/2016   Procedure: Irrigation and Debridment Left Groin; Closure of Distal Incisions Left Leg; Medial Calf and Medial Thigh;  Surgeon: Maeola Harman, MD;  Location: [facility] OR;  Service: Vascular;  Laterality: N/A;  . KNEE SURGERY    . LEFT HEART CATH AND CORS/GRAFTS ANGIOGRAPHY N/A 04/15/2016   Procedure: Left Heart Cath and Cors/Grafts Angiography;  Surgeon: Lennette Bihari, MD;  Location: [facility] INVASIVE CV LAB;  Service: Cardiovascular;  Laterality: N/A;  . MULTIPLE TOOTH EXTRACTIONS    . MUSCLE FLAP CLOSURE Left 09/18/2016   Procedure: LEFT GRACILIS MUSCLE FLAP TO LEFT GROIN;  Surgeon: Glenna Fellows, MD;  Location: [facility] OR;  Service: Plastics;  Laterality: Left;  . PATCH ANGIOPLASTY Left 09/10/2016   Procedure: Bovine PATCH ANGIOPLASTY Left Femoral Artery and Below Knee Popliteal Artery;  Surgeon: Fransisco Hertz, MD;  Location: [facility] OR;  Service: Vascular;  Laterality: Left;  . PERIPHERAL VASCULAR INTERVENTION Left 10/12/2016   Procedure: PERIPHERAL VASCULAR INTERVENTION;  Surgeon: Maeola Harman, MD;  Location: [facility] INVASIVE CV LAB;  Service:  Cardiovascular;  Laterality: Left;  . REMOVAL OF GRAFT Left 09/10/2016   Procedure: Excision of Left Femoral-Popliteal Graft, Placement of Antibiotic Beads Left Leg;  Surgeon: Fransisco Hertz, MD;  Location: [facility] OR;  Service: Vascular;  Laterality: Left;  . TRANSMETATARSAL AMPUTATION Left 11/19/2016   Procedure: TRANSMETATARSAL AMPUTATION;  Surgeon: Maeola Harman, MD;  Location: [facility] OR;  Service: Vascular;  Laterality: Left;     OB History   None      Home Medications    Prior to Admission medications   Medication Sig Start Date End Date Taking? Authorizing Provider  acetaminophen (TYLENOL) 325 MG tablet Take 2 tablets (650 mg total) by mouth every 6 (six) hours as needed for mild pain. 12/04/16   Love, Evlyn Kanner, PA-C  albuterol (PROVENTIL HFA;VENTOLIN HFA) 108 (90 Base) MCG/ACT inhaler Inhale 2 puffs into the lungs every 6 (six) hours as needed for wheezing or shortness of breath. 12/11/16   Hoy Register, MD  amoxicillin (AMOXIL) 500 MG capsule Take 1 capsule (500 mg total) by mouth 3 (three) times daily. 03/10/17   Hoy Register, MD  aspirin EC 81 MG tablet Take 1 tablet (81 mg total) by mouth daily. 10/16/15  Langeland, Dawn T, MD  atorvastatin (LIPITOR) 80 MG tablet TAKE 1 TABLET (80 MG TOTAL) BY MOUTH DAILY. 03/30/17   Hoy Register, MD  clopidogrel (PLAVIX) 75 MG tablet Take 1 tablet (75 mg total) by mouth daily with breakfast. 12/11/16   Hoy Register, MD  diclofenac sodium (VOLTAREN) 1 % GEL Apply 4 g topically 4 (four) times daily. 01/22/17   Hoy Register, MD  docusate sodium (COLACE) 100 MG capsule Take 1 capsule (100 mg total) by mouth daily. 12/11/16   Hoy Register, MD  DULoxetine (CYMBALTA) 60 MG capsule Take 1 capsule (60 mg total) by mouth daily. 12/11/16   Hoy Register, MD  Fluticasone-Salmeterol (ADVAIR) 100-50 MCG/DOSE AEPB Inhale 1 puff into the lungs 2 (two) times daily. 12/11/16   Hoy Register, MD  furosemide (LASIX) 40 MG tablet Take 1 tablet  (40 mg total) by mouth daily. 03/10/17   Hoy Register, MD  gabapentin (NEURONTIN) 300 MG capsule Take 2 capsules (600 mg total) by mouth 2 (two) times daily. 03/10/17   Hoy Register, MD  hydrocortisone cream 0.5 % Apply 1 application topically 2 (two) times daily. 12/25/16   Hoy Register, MD  Insulin Glargine (LANTUS SOLOSTAR) 100 UNIT/ML Solostar Pen Inject 30 Units into the skin at bedtime. 03/10/17   Hoy Register, MD  Insulin Pen Needle (B-D UF III MINI PEN NEEDLES) 31G X 5 MM MISC Use as directed 12/07/16   Hoy Register, MD  lisinopril (PRINIVIL,ZESTRIL) 5 MG tablet Take 1 tablet (5 mg total) by mouth daily. 12/11/16   Hoy Register, MD  metFORMIN (GLUCOPHAGE) 500 MG tablet Take 1 tablet (500 mg total) by mouth 2 (two) times daily. 12/11/16   Hoy Register, MD  metoprolol tartrate (LOPRESSOR) 25 MG tablet Take 0.5 tablets (12.5 mg total) by mouth 2 (two) times daily. 12/11/16   Hoy Register, MD  oxybutynin (DITROPAN) 5 MG tablet Take 1 tablet (5 mg total) by mouth 2 (two) times daily. 12/11/16   Hoy Register, MD  pantoprazole (PROTONIX) 20 MG tablet Take 1 tablet (20 mg total) by mouth daily. 12/11/16   Hoy Register, MD  SANTYL ointment Apply 1 application topically daily. 12/25/16   [provider]  sulfamethoxazole-trimethoprim (BACTRIM DS,SEPTRA DS) 800-160 MG tablet Take 1 tablet by mouth 2 (two) times daily. Patient not taking: Reported on 03/10/2017 01/22/17   Hoy Register, MD    Family History Family History  Problem Relation Age of Onset  . Stroke Mother     Social History Social History   Tobacco Use  . Smoking status: Former Smoker    Packs/day: 0.25    Types: Cigarettes    Last attempt to quit: 04/04/2016    Years since quitting: 1.1  . Smokeless tobacco: Never Used  Substance Use Topics  . Alcohol use: No  . Drug use: No     Allergies   Naproxen and Tramadol hcl   Review of Systems Review of Systems  Musculoskeletal:  Positive for myalgias.       Distal R leg pain  All other systems reviewed and are negative.    Physical Exam Updated Vital Signs BP (!) 188/63 (BP Location: Right Arm)   Pulse 72   Temp 99.2 F (37.3 C) (Oral)   Resp 18   Ht  (1.575 m)   Wt 59.4 kg (131 lb)   SpO2 99%   BMI 23.96 kg/m   Physical Exam  Constitutional: She is oriented to person, place, and time. She appears well-developed and well-nourished.  No distress.  Appears in NAD  HENT:  Head: Normocephalic and atraumatic.  Eyes: Pupils are equal, round, and reactive to light. Conjunctivae and EOM are normal.  Neck: Normal range of motion. Neck supple.  Cardiovascular: Normal rate, regular rhythm and intact distal pulses.  R foot cool to touch with decreased cap refill. No palpable DP or PT pulse. Lower R leg warm and pink. L foot partially amputated.   Pulmonary/Chest: Effort normal and breath sounds normal. No respiratory distress. She has no wheezes.  Abdominal: Soft. Bowel sounds are normal. She exhibits no distension. There is no tenderness.  Musculoskeletal: Normal range of motion.  Neurological: She is alert and oriented to person, place, and time.  Skin: Skin is warm and dry.  Psychiatric: She has a normal mood and affect.  Nursing note and vitals reviewed.  ED Treatments / Results  Labs (all labs ordered are listed, but only abnormal results are displayed) Labs Reviewed  CBC - Abnormal; Notable for the following components:      Result Value   RBC 3.76 (*)    Hemoglobin 11.3 (*)    HCT 34.1 (*)    All other components within normal limits  CBC  HEPARIN LEVEL (UNFRACTIONATED)  COMPREHENSIVE METABOLIC PANEL  PROTIME-INR  TYPE AND SCREEN    EKG None  Radiology No results found.  Procedures .Critical Care Performed by: Alveria Apley, PA-C Authorized by: Alveria Apley, PA-C   Critical care provider statement:    Critical care time (minutes):  30   Critical care time was  exclusive of:  Separately billable procedures and treating other patients and teaching time   Critical care was necessary to treat or prevent imminent or life-threatening deterioration of the following conditions:  Circulatory failure   Critical care was time spent personally by me on the following activities:  Blood draw for specimens, development of treatment plan with patient or surrogate, discussions with consultants, evaluation of patient's response to treatment, examination of patient, obtaining history from patient or surrogate, ordering and performing treatments and interventions, ordering and review of laboratory studies, ordering and review of radiographic studies, pulse oximetry, re-evaluation of patient's condition and review of old charts   I assumed direction of critical care for this patient from another provider in my specialty: no   Comments:     Pt presenting with threatened limb ischemia. Heparin started and vascular consult initiated.    (including critical care time)  Medications Ordered in ED Medications  heparin ADULT infusion 100 units/mL (25000 units/237mL sodium chloride 0.45%) (has no administration in time range)  heparin bolus via infusion 2,000 Units (has no administration in time range)     Initial Impression / Assessment and Plan / ED Course  I have reviewed the triage vital signs and the nursing notes.  Pertinent labs & imaging results that were available during my care of the patient were reviewed by me and considered in my medical decision making (see chart for details).     Patient presenting for evaluation of right foot and ankle pain, worsened acutely today.  Physical exam concerning, patient with no palpable pedal pulse, foot cool.  No chest pain or shortness of breath.  Will obtain basic labs, start heparin, and consult with vascular.  Discussed with attending, Dr. Rush Landmark agrees to plan.  Dr. Randie Heinz from vascular consulted, and he evaluated the patient.   Will admit for further evaluation.  Final Clinical Impressions(s) / ED Diagnoses   Final diagnoses:  None  ED Discharge Orders    None       Alveria Apley, PA-C 05/22/17 [id number]    Tegeler, Canary Brim, MD 05/22/17 1036

## 2017-05-21 NOTE — Progress Notes (Signed)
ANTICOAGULATION CONSULT NOTE - Initial Consult  Pharmacy Consult for heparin Indication: ischemic limb  Allergies  Allergen Reactions  . Naproxen Itching  . Tramadol Hcl Itching    Patient Measurements: Height:  (157.5 cm) Weight: 131 lb (59.4 kg) IBW/kg (Calculated) : 50.1  Vital Signs: Temp: 99.5 F (37.5 C) (05/10 2142) Temp Source: Oral (05/10 2142) BP: 128/43 (05/10 2142) Pulse Rate: 73 (05/10 2142)  Labs: No results for input(s): HGB, HCT, PLT, APTT, LABPROT, INR, HEPARINUNFRC, HEPRLOWMOCWT, CREATININE, CKTOTAL, CKMB, TROPONINI in the last 72 hours.  CrCl cannot be calculated (Patient's most recent lab result is older than the maximum 21 days allowed.).  Assessment: CC/HPI: 67 yo f presenting with r ankle pain x 1 week - likely ischemic limb  PMH: PVD HTN HLD DM  Anticoag: none pta - iv hep for ischemic limb Pending BL CBC  Goal of Therapy:  Heparin level 0.3-0.7 units/ml Monitor platelets by anticoagulation protocol: Yes   Plan:  Heparin bolus 2500 units x 1 Heparin gtt 850 units/hr Daily hl cbc F/u vascular surgery plans  Isaac Bliss, PharmD, BCPS, BCCCP Clinical Pharmacist Clinical phone for 06/02/2017 from 1430 - 2300: [phone] If after 2300, please call main pharmacy at: [phone] 05/19/2017 10:34 PM

## 2017-05-21 NOTE — H&P (Signed)
HP    Reason for Consult:  Right leg pain Referring Physician: ED MRN #:  [id number]  History of Present Illness: This is a 67 y.o. female with history of a right femoral to below-knee popliteal artery bypass graft with PTFE and right common femoral endarterectomy as well.  She subsequent groin breakdown but that healed without any thing other than debridement.  On the left side she will underwent a similar procedure had an infection of the graft that was subsequently excised.  She then had viabahn stenting when all of her wounds healed from her femoral to her popliteal arteries.  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  She presents today with right lower extremity pain that began exactly 1 week ago but has been worse for the past 6 hours she can still feel her foot and move it..  She has been walking.  Did not say anything to anybody until her sister arrived from the grocery store..  She continues to take her aspirin Plavix and statin.  She quit smoking prior to our last visit and has not returned.  Past Medical History:  Diagnosis Date  . Arthritis   . Asthma   . Coronary artery disease   . Diabetes mellitus without complication (HCC)   . Dyspnea   . Fibromyalgia   . GERD (gastroesophageal reflux disease)   . High cholesterol   . Hypertension   . 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 PONV (postoperative nausea and vomiting)   . PVD (peripheral vascular disease) (HCC)   . Urinary frequency   . Urinary incontinence     Past Surgical History:  Procedure Laterality Date  . ABDOMINAL AORTOGRAM W/LOWER EXTREMITY N/A 03/25/2016   Procedure: Abdominal Aortogram w/Lower Extremity;  Surgeon: Maeola Harman, MD;  Location: [facility] INVASIVE CV LAB;  Service: Cardiovascular;  Laterality: N/A;  . ABDOMINAL AORTOGRAM W/LOWER EXTREMITY N/A 10/12/2016   Procedure: ABDOMINAL AORTOGRAM W/LOWER EXTREMITY;  Surgeon: Maeola Harman, MD;   Location: [facility] INVASIVE CV LAB;  Service: Cardiovascular;  Laterality: N/A;  . ABDOMINAL HYSTERECTOMY    . AMPUTATION TOE Left 10/13/2016   Procedure: AMPUTATION LEFT GREAT TOE AND SECOND TOE;  Surgeon: Maeola Harman, MD;  Location: [facility] OR;  Service: Vascular;  Laterality: Left;  . AORTOGRAM N/A 09/16/2016   Procedure: AORTOGRAM WITH LEFT LOWER EXTREMITY RUNOFF;  Surgeon: Sherren Kerns, MD;  Location: [facility] OR;  Service: Vascular;  Laterality: N/A;  . APPLICATION OF WOUND VAC Right 06/14/2016   Procedure: APPLICATION OF WOUND VAC;  Surgeon: Sherren Kerns, MD;  Location: [facility] OR;  Service: Vascular;  Laterality: Right;  . COLONOSCOPY    . CORONARY ARTERY BYPASS GRAFT    . CORONARY STENT INTERVENTION N/A 04/15/2016   Procedure: Coronary Stent Intervention;  Surgeon: Lennette Bihari, MD;  Location: [facility] INVASIVE CV LAB;  Service: Cardiovascular;  Laterality: N/A;  . ENDARTERECTOMY FEMORAL Right 05/21/2016   Procedure: RIGHT FEMORAL ENDARTERECTOMY;  Surgeon: Maeola Harman, MD;  Location: [facility] OR;  Service: Vascular;  Laterality: Right;  . ENDARTERECTOMY FEMORAL Left 07/20/2016   Procedure: LEFT FEMORAL ENDARTERECTOMY;  Surgeon: Maeola Harman, MD;  Location: [facility] OR;  Service: Vascular;  Laterality: Left;  . FEMORAL BYPASS Left 07/20/2016   Left common femoral endarterectomy  . FEMORAL-POPLITEAL BYPASS GRAFT Right 05/21/2016   Procedure: RIGHT FEMORAL-BELOW KNEE POPLITEAL ARTERY BYPASS GRAFT USING PROPATEN GORETEX GRAFT;  Surgeon: Maeola Harman, MD;  Location: [facility] OR;  Service: Vascular;  Laterality: Right;  . FEMORAL-POPLITEAL BYPASS GRAFT Left 07/20/2016   Procedure: LEFT FEMORAL TO BELOW KNEE POPLITEAL ARTERY  BYPASS GRAFT;  Surgeon: Maeola Harman, MD;  Location: [facility] OR;  Service: Vascular;  Laterality: Left;  . GROIN DEBRIDEMENT Right 06/14/2016   Procedure: GROIN DEBRIDEMENT;  Surgeon: Sherren Kerns, MD;  Location: [facility] OR;  Service: Vascular;  Laterality:  Right;  . I&D EXTREMITY N/A 09/12/2016   Procedure: Irrigation and Debridment Left Groin; Closure of Distal Incisions Left Leg; Medial Calf and Medial Thigh;  Surgeon: Maeola Harman, MD;  Location: [facility] OR;  Service: Vascular;  Laterality: N/A;  . KNEE SURGERY    . LEFT HEART CATH AND CORS/GRAFTS ANGIOGRAPHY N/A 04/15/2016   Procedure: Left Heart Cath and Cors/Grafts Angiography;  Surgeon: Lennette Bihari, MD;  Location: [facility] INVASIVE CV LAB;  Service: Cardiovascular;  Laterality: N/A;  . MULTIPLE TOOTH EXTRACTIONS    . MUSCLE FLAP CLOSURE Left 09/18/2016   Procedure: LEFT GRACILIS MUSCLE FLAP TO LEFT GROIN;  Surgeon: Glenna Fellows, MD;  Location: [facility] OR;  Service: Plastics;  Laterality: Left;  . PATCH ANGIOPLASTY Left 09/10/2016   Procedure: [place]ovine PATCH ANGIOPLASTY Left Femoral Artery and Below Knee Popliteal Artery;  Surgeon: Fransisco Hertz, MD;  Location: [facility] OR;  Service: Vascular;  Laterality: Left;  . PERIPHERAL VASCULAR INTERVENTION Left 10/12/2016   Procedure: PERIPHERAL VASCULAR INTERVENTION;  Surgeon: Maeola Harman, MD;  Location: [facility] INVASIVE CV LAB;  Service: Cardiovascular;  Laterality: Left;  . REMOVAL OF GRAFT Left 09/10/2016   Procedure: Excision of Left Femoral-Popliteal Graft, Placement of Antibiotic Beads Left Leg;  Surgeon: Fransisco Hertz, MD;  Location: [facility] OR;  Service: Vascular;  Laterality: Left;  . TRANSMETATARSAL AMPUTATION Left 11/19/2016   Procedure: TRANSMETATARSAL AMPUTATION;  Surgeon: Maeola Harman, MD;  Location: [facility] OR;  Service: Vascular;  Laterality: Left;    Allergies  Allergen Reactions  . Naproxen Itching  . Tramadol Hcl Itching    Prior to Admission medications   Medication Sig Start Date End Date Taking? Authorizing Provider  acetaminophen (TYLENOL) 325 MG tablet Take 2 tablets (650 mg total) by mouth every 6 (six) hours as needed for mild pain. 12/04/16   Love, Evlyn Kanner, PA-C  albuterol (PROVENTIL HFA;VENTOLIN HFA) 108 (90 Base)  MCG/ACT inhaler Inhale 2 puffs into the lungs every 6 (six) hours as needed for wheezing or shortness of breath. 12/11/16   Hoy Register, MD  amoxicillin (AMOXIL) 500 MG capsule Take 1 capsule (500 mg total) by mouth 3 (three) times daily. 03/10/17   Hoy Register, MD  aspirin EC 81 MG tablet Take 1 tablet (81 mg total) by mouth daily. 10/16/15   Langeland, Dawn T, MD  atorvastatin (LIPITOR) 80 MG tablet TAKE 1 TABLET (80 MG TOTAL) BY MOUTH DAILY. 03/30/17   Hoy Register, MD  clopidogrel (PLAVIX) 75 MG tablet Take 1 tablet (75 mg total) by mouth daily with breakfast. 12/11/16   Hoy Register, MD  diclofenac sodium (VOLTAREN) 1 % GEL Apply 4 g topically 4 (four) times daily. 01/22/17   Hoy Register, MD  docusate sodium (COLACE) 100 MG capsule Take 1 capsule (100 mg total) by mouth daily. 12/11/16   Hoy Register, MD  DULoxetine (CYMBALTA) 60 MG capsule Take 1 capsule (60 mg total) by mouth daily. 12/11/16   Hoy Register, MD  Fluticasone-Salmeterol (ADVAIR) 100-50 MCG/DOSE AEPB Inhale 1 puff into the lungs 2 (two) times daily. 12/11/16   Newlin,  Enobong, MD  furosemide (LASIX) 40 MG tablet Take 1 tablet (40 mg total) by mouth daily. 03/10/17   Hoy Register, MD  gabapentin (NEURONTIN) 300 MG capsule Take 2 capsules (600 mg total) by mouth 2 (two) times daily. 03/10/17   Hoy Register, MD  hydrocortisone cream 0.5 % Apply 1 application topically 2 (two) times daily. 12/25/16   Hoy Register, MD  Insulin Glargine (LANTUS SOLOSTAR) 100 UNIT/ML Solostar Pen Inject 30 Units into the skin at bedtime. 03/10/17   Hoy Register, MD  Insulin Pen Needle (B-D UF III MINI PEN NEEDLES) 31G X 5 MM MISC Use as directed 12/07/16   Hoy Register, MD  lisinopril (PRINIVIL,ZESTRIL) 5 MG tablet Take 1 tablet (5 mg total) by mouth daily. 12/11/16   Hoy Register, MD  metFORMIN (GLUCOPHAGE) 500 MG tablet Take 1 tablet (500 mg total) by mouth 2 (two) times daily. 12/11/16   Hoy Register, MD    metoprolol tartrate (LOPRESSOR) 25 MG tablet Take 0.5 tablets (12.5 mg total) by mouth 2 (two) times daily. 12/11/16   Hoy Register, MD  oxybutynin (DITROPAN) 5 MG tablet Take 1 tablet (5 mg total) by mouth 2 (two) times daily. 12/11/16   Hoy Register, MD  pantoprazole (PROTONIX) 20 MG tablet Take 1 tablet (20 mg total) by mouth daily. 12/11/16   Hoy Register, MD  SANTYL ointment Apply 1 application topically daily. 12/25/16   [provider]  sulfamethoxazole-trimethoprim (BACTRIM DS,SEPTRA DS) 800-160 MG tablet Take 1 tablet by mouth 2 (two) times daily. Patient not taking: Reported on 03/10/2017 01/22/17   Hoy Register, MD    Social History   Socioeconomic History  . Marital status: Widowed    Spouse name: Not on file  . Number of children: Not on file  . Years of education: Not on file  . Highest education level: Not on file  Occupational History  . Not on file  Social Needs  . Financial resource strain: Not on file  . Food insecurity:    Worry: Not on file    Inability: Not on file  . Transportation needs:    Medical: Not on file    Non-medical: Not on file  Tobacco Use  . Smoking status: Former Smoker    Packs/day: 0.25    Types: Cigarettes    Last attempt to quit: 04/04/2016    Years since quitting: 1.1  . Smokeless tobacco: Never Used  Substance and Sexual Activity  . Alcohol use: No  . Drug use: No  . Sexual activity: Not on file  Lifestyle  . Physical activity:    Days per week: Not on file    Minutes per session: Not on file  . Stress: Not on file  Relationships  . Social connections:    Talks on phone: Not on file    Gets together: Not on file    Attends religious service: Not on file    Active member of club or organization: Not on file    Attends meetings of clubs or organizations: Not on file    Relationship status: Not on file  . Intimate partner violence:    Fear of current or ex partner: Not on file    Emotionally abused: Not  on file    Physically abused: Not on file    Forced sexual activity: Not on file  Other Topics Concern  . Not on file  Social History Narrative  . Not on file     Family History  Problem Relation  Age of Onset  . Stroke Mother     ROS: [x]  Positive   [ ]  Negative   [ ]  All sytems reviewed and are negative  Cardiovascular: []  chest pain/pressure []  palpitations []  SOB lying flat []  DOE []  pain in legs while walking [x]  pain in legs at rest []  pain in legs at night []  non-healing ulcers []  hx of DVT []  swelling in legs  Pulmonary: []  productive cough []  asthma/wheezing []  home O2  Neurologic: []  weakness in []  arms []  legs []  numbness in []  arms []  legs []  hx of CVA []  mini stroke [] difficulty speaking or slurred speech []  temporary loss of vision in one eye []  dizziness  Hematologic: []  hx of cancer []  bleeding problems []  problems with blood clotting easily  Endocrine:   []  diabetes []  thyroid disease  GI []  vomiting blood []  blood in stool  GU: []  CKD/renal failure []  HD--[]  M/W/F or []  T/T/S []  burning with urination []  blood in urine  Psychiatric: []  anxiety []  depression  Musculoskeletal: []  arthritis []  joint pain  Integumentary: []  rashes []  ulcers  Constitutional: []  fever []  chills   Physical Examination  Vitals:   05/20/2017 2142  BP: (!) 128/43  Pulse: 73  Resp: 18  Temp: 99.5 F (37.5 C)  SpO2: 99%   Body mass index is 23.96 kg/m.  General:  WDWN in NAD HENT: WNL, normocephalic Pulmonary: normal non-labored breathing Cardiac: bilateral palpable femoral pulses Strong left posterior tibial signal that is triphasic Right side has only monophasic dorsalis pedis signal Abdomen: soft, NT/ND, no masses Extremities: Well-healed left transmetatarsal amputation Right foot has capillary refill about 2 seconds, foot is cooler than the left but is sensorimotor intact Musculoskeletal: no muscle wasting or atrophy  Neurologic:  A&O X 3; Appropriate Affect     CBC    Component Value Date/Time   WBC 6.3 12/31/2016 0339   RBC 3.31 (L) 12/31/2016 0339   HGB 9.7 (L) 12/31/2016 0339   HGB 10.8 (L) 07/01/2016 1445   HCT 31.7 (L) 12/31/2016 0339   HCT 32.9 (L) 07/01/2016 1445   PLT 269 12/31/2016 0339   PLT 340 07/01/2016 1445   MCV 95.8 12/31/2016 0339   MCV 88 07/01/2016 1445   MCH 29.3 12/31/2016 0339   MCHC 30.6 12/31/2016 0339   RDW 16.0 (H) 12/31/2016 0339   RDW 15.5 (H) 07/01/2016 1445   LYMPHSABS 2.1 11/23/2016 0823   LYMPHSABS 3.0 07/01/2016 1445   MONOABS 0.3 11/23/2016 0823   EOSABS 0.3 11/23/2016 0823   EOSABS 0.4 07/01/2016 1445   BASOSABS 0.0 11/23/2016 0823   BASOSABS 0.0 07/01/2016 1445    BMET    Component Value Date/Time   NA 138 01/01/2017 0638   NA 140 04/14/2016 1431   K 3.8 01/01/2017 0638   CL 104 01/01/2017 0638   CO2 27 01/01/2017 0638   GLUCOSE 158 (H) 01/01/2017 0638   BUN 25 (H) 01/01/2017 0638   BUN 19 04/14/2016 1431   CREATININE 0.89 01/01/2017 0638   CALCIUM 8.9 01/01/2017 0638   GFRNONAA >60 01/01/2017 0638   GFRAA >60 01/01/2017 1610    COAGS: Lab Results  Component Value Date   INR 1.09 09/10/2016   INR 0.93 07/16/2016   INR 1.10 06/12/2016       ASSESSMENT/PLAN: This is a 67 y.o. female with multiple bilateral lower extremity procedures now with clinically occluded right femoral to below-knee popliteal artery bypass graft.  She is not terribly ischemic at  this time only exhibiting some pain as the graft was initially done for a wound on her foot that was nonhealing but is now healed.  Her best option at this point will be lysis of the graft the timing of which has yet to be determined.  I have asked her to not eat after midnight tonight we can reevaluate in the morning possibly initiate the lysis tomorrow versus Sunday work completion could be in the [facility] lab.  She demonstrates good understanding.  We will start heparin drip tonight.   Nyquan Selbe C. Randie Heinz,  MD [facility] [place] Office: [phone] Pager: [phone]

## 2017-05-21 NOTE — ED Notes (Addendum)
Unable to palpate pulses, unable to locate pulses with doppler. Resident at bedside, unable to locate pulses in R foot. Foot cold, extended blanching.

## 2017-05-21 NOTE — ED Triage Notes (Signed)
Pt c/o pain to R ankle radiating up R calf x 1 week. Denies injury.  Fem-Pop bypass bilat legs last year.

## 2017-05-22 ENCOUNTER — Other Ambulatory Visit: Payer: Self-pay

## 2017-05-22 LAB — MRSA PCR SCREENING: MRSA by PCR: NEGATIVE

## 2017-05-22 LAB — GLUCOSE, CAPILLARY
Glucose-Capillary: 159 mg/dL — ABNORMAL HIGH (ref 65–99)
Glucose-Capillary: 199 mg/dL — ABNORMAL HIGH (ref 65–99)
Glucose-Capillary: 351 mg/dL — ABNORMAL HIGH (ref 65–99)

## 2017-05-22 LAB — CBC
HEMATOCRIT: 31.6 % — AB (ref 36.0–46.0)
HEMOGLOBIN: 10.9 g/dL — AB (ref 12.0–15.0)
MCH: 31.1 pg (ref 26.0–34.0)
MCHC: 34.5 g/dL (ref 30.0–36.0)
MCV: 90 fL (ref 78.0–100.0)
Platelets: 244 10*3/uL (ref 150–400)
RBC: 3.51 MIL/uL — AB (ref 3.87–5.11)
RDW: 13.5 % (ref 11.5–15.5)
WBC: 7 10*3/uL (ref 4.0–10.5)

## 2017-05-22 LAB — HEPARIN LEVEL (UNFRACTIONATED)
HEPARIN UNFRACTIONATED: 0.38 [IU]/mL (ref 0.30–0.70)
Heparin Unfractionated: 0.33 IU/mL (ref 0.30–0.70)

## 2017-05-22 MED ORDER — HYDRALAZINE HCL 20 MG/ML IJ SOLN: 5.0000 mg | INTRAMUSCULAR | Status: DC | PRN

## 2017-05-22 MED ORDER — DULOXETINE HCL 60 MG PO CPEP
60.0000 mg | ORAL_CAPSULE | Freq: Every day | ORAL | Status: DC
  Administered 2017-05-22 – 2017-05-25 (×3): 60 mg via ORAL
  Filled 2017-05-22 (×4): qty 1

## 2017-05-22 MED ORDER — DOCUSATE SODIUM 100 MG PO CAPS
100.0000 mg | ORAL_CAPSULE | Freq: Every day | ORAL | Status: DC
  Administered 2017-05-22 – 2017-05-25 (×3): 100 mg via ORAL
  Filled 2017-05-22 (×4): qty 1

## 2017-05-22 MED ORDER — GUAIFENESIN-DM 100-10 MG/5ML PO SYRP
15.0000 mL | ORAL_SOLUTION | ORAL | Status: DC | PRN
  Filled 2017-05-22: qty 15

## 2017-05-22 MED ORDER — OXYBUTYNIN CHLORIDE 5 MG PO TABS
5.0000 mg | ORAL_TABLET | Freq: Two times a day (BID) | ORAL | Status: DC
  Administered 2017-05-22 – 2017-05-25 (×8): 5 mg via ORAL
  Filled 2017-05-22 (×10): qty 1

## 2017-05-22 MED ORDER — PANTOPRAZOLE SODIUM 40 MG PO TBEC
40.0000 mg | DELAYED_RELEASE_TABLET | Freq: Every day | ORAL | Status: DC
  Administered 2017-05-22 – 2017-05-25 (×3): 40 mg via ORAL
  Filled 2017-05-22 (×3): qty 1

## 2017-05-22 MED ORDER — ALBUTEROL SULFATE (2.5 MG/3ML) 0.083% IN NEBU: 2.5000 mg | INHALATION_SOLUTION | Freq: Four times a day (QID) | RESPIRATORY_TRACT | Status: DC | PRN

## 2017-05-22 MED ORDER — HYDROMORPHONE HCL 1 MG/ML IJ SOLN
0.5000 mg | INTRAMUSCULAR | Status: DC | PRN
Start: 2017-05-22 — End: 2017-05-26
  Administered 2017-05-23: 1 mg via INTRAVENOUS
  Filled 2017-05-22: qty 1

## 2017-05-22 MED ORDER — METOPROLOL TARTRATE 5 MG/5ML IV SOLN: 2.0000 mg | INTRAVENOUS | Status: DC | PRN

## 2017-05-22 MED ORDER — INSULIN ASPART 100 UNIT/ML ~~LOC~~ SOLN
4.0000 [IU] | Freq: Three times a day (TID) | SUBCUTANEOUS | Status: DC
  Administered 2017-05-22 – 2017-05-25 (×6): 4 [IU] via SUBCUTANEOUS

## 2017-05-22 MED ORDER — CLOPIDOGREL BISULFATE 75 MG PO TABS
75.0000 mg | ORAL_TABLET | Freq: Every day | ORAL | Status: DC
  Administered 2017-05-22 – 2017-05-25 (×2): 75 mg via ORAL
  Filled 2017-05-22 (×3): qty 1

## 2017-05-22 MED ORDER — OXYCODONE-ACETAMINOPHEN 5-325 MG PO TABS
1.0000 | ORAL_TABLET | ORAL | Status: DC | PRN
Start: 2017-05-22 — End: 2017-05-26
  Administered 2017-05-22 – 2017-05-25 (×4): 2 via ORAL
  Filled 2017-05-22 (×4): qty 2

## 2017-05-22 MED ORDER — FUROSEMIDE 40 MG PO TABS
40.0000 mg | ORAL_TABLET | Freq: Every day | ORAL | Status: DC
  Administered 2017-05-22: 40 mg via ORAL
  Filled 2017-05-22: qty 1

## 2017-05-22 MED ORDER — POTASSIUM CHLORIDE CRYS ER 20 MEQ PO TBCR
20.0000 meq | EXTENDED_RELEASE_TABLET | Freq: Once | ORAL | Status: AC
  Administered 2017-05-22: 20 meq via ORAL
  Filled 2017-05-22: qty 1

## 2017-05-22 MED ORDER — BISACODYL 5 MG PO TBEC: 5.0000 mg | DELAYED_RELEASE_TABLET | Freq: Every day | ORAL | Status: DC | PRN

## 2017-05-22 MED ORDER — GABAPENTIN 300 MG PO CAPS
600.0000 mg | ORAL_CAPSULE | Freq: Two times a day (BID) | ORAL | Status: DC
  Administered 2017-05-22 – 2017-05-24 (×6): 600 mg via ORAL
  Administered 2017-05-25: 300 mg via ORAL
  Administered 2017-05-25: 600 mg via ORAL
  Filled 2017-05-22 (×9): qty 2

## 2017-05-22 MED ORDER — ONDANSETRON HCL 4 MG/2ML IJ SOLN: 4.0000 mg | Freq: Four times a day (QID) | INTRAMUSCULAR | Status: DC | PRN

## 2017-05-22 MED ORDER — METOPROLOL TARTRATE 12.5 MG HALF TABLET
12.5000 mg | ORAL_TABLET | Freq: Two times a day (BID) | ORAL | Status: DC
  Administered 2017-05-22 – 2017-05-25 (×8): 12.5 mg via ORAL
  Filled 2017-05-22 (×8): qty 1

## 2017-05-22 MED ORDER — HEPARIN BOLUS VIA INFUSION
2000.0000 [IU] | Freq: Once | INTRAVENOUS | Status: AC
  Administered 2017-05-22: 2000 [IU] via INTRAVENOUS
  Filled 2017-05-22: qty 2000

## 2017-05-22 MED ORDER — ACETAMINOPHEN 325 MG PO TABS: 650.0000 mg | ORAL_TABLET | Freq: Four times a day (QID) | ORAL | Status: DC | PRN

## 2017-05-22 MED ORDER — LABETALOL HCL 5 MG/ML IV SOLN: 10.0000 mg | INTRAVENOUS | Status: DC | PRN

## 2017-05-22 MED ORDER — ATORVASTATIN CALCIUM 80 MG PO TABS
80.0000 mg | ORAL_TABLET | Freq: Every day | ORAL | Status: DC
  Administered 2017-05-22 – 2017-05-25 (×3): 80 mg via ORAL
  Filled 2017-05-22 (×4): qty 1

## 2017-05-22 MED ORDER — LISINOPRIL 5 MG PO TABS
5.0000 mg | ORAL_TABLET | Freq: Every day | ORAL | Status: DC
  Administered 2017-05-22 – 2017-05-25 (×3): 5 mg via ORAL
  Filled 2017-05-22 (×4): qty 1

## 2017-05-22 MED ORDER — SODIUM CHLORIDE 0.9 % IV SOLN
INTRAVENOUS | Status: DC
  Administered 2017-05-22 – 2017-05-23 (×3): via INTRAVENOUS

## 2017-05-22 MED ORDER — ALUM & MAG HYDROXIDE-SIMETH 200-200-20 MG/5ML PO SUSP
15.0000 mL | ORAL | Status: DC | PRN
  Filled 2017-05-22: qty 30

## 2017-05-22 MED ORDER — ASPIRIN EC 81 MG PO TBEC
81.0000 mg | DELAYED_RELEASE_TABLET | Freq: Every day | ORAL | Status: DC
  Administered 2017-05-22 – 2017-05-25 (×3): 81 mg via ORAL
  Filled 2017-05-22 (×4): qty 1

## 2017-05-22 MED ORDER — INSULIN GLARGINE 100 UNIT/ML ~~LOC~~ SOLN
15.0000 [IU] | Freq: Every day | SUBCUTANEOUS | Status: DC
  Administered 2017-05-22 – 2017-05-25 (×5): 15 [IU] via SUBCUTANEOUS
  Filled 2017-05-22 (×6): qty 0.15

## 2017-05-22 MED ORDER — PHENOL 1.4 % MT LIQD: 1.0000 | OROMUCOSAL | Status: DC | PRN

## 2017-05-22 MED ORDER — PANTOPRAZOLE SODIUM 20 MG PO TBEC: 20.0000 mg | DELAYED_RELEASE_TABLET | Freq: Every day | ORAL | Status: DC

## 2017-05-22 MED ORDER — INSULIN ASPART 100 UNIT/ML ~~LOC~~ SOLN
0.0000 [IU] | Freq: Three times a day (TID) | SUBCUTANEOUS | Status: DC
  Administered 2017-05-22 (×2): 11 [IU] via SUBCUTANEOUS
  Administered 2017-05-22: 3 [IU] via SUBCUTANEOUS
  Administered 2017-05-23: 5 [IU] via SUBCUTANEOUS
  Administered 2017-05-23: 3 [IU] via SUBCUTANEOUS
  Administered 2017-05-23 – 2017-05-24 (×2): 8 [IU] via SUBCUTANEOUS
  Administered 2017-05-25 (×2): 5 [IU] via SUBCUTANEOUS

## 2017-05-22 NOTE — Progress Notes (Addendum)
ANTICOAGULATION CONSULT NOTE  Pharmacy Consult for heparin Indication: ischemic limb  Allergies  Allergen Reactions  . Naproxen Itching  . Tramadol Hcl Itching    Patient Measurements: Height:  (157.5 cm) Weight: 140 lb 6.9 oz (63.7 kg) IBW/kg (Calculated) : 50.1  Vital Signs: Temp: 98.3 F (36.8 C) (05/11 0900) Temp Source: Oral (05/11 0900) BP: 145/44 (05/11 0900) Pulse Rate: 61 (05/11 0900)  Labs: Recent Labs    06/05/2017 2242 06/10/2017 2254 05/22/17 0351 05/22/17 1124  HGB 11.3*  --  10.9*  --   HCT 34.1*  --  31.6*  --   PLT 228  --  244  --   LABPROT  --  11.8  --   --   INR  --  0.87  --   --   HEPARINUNFRC  --   --  <0.10* 0.38  CREATININE  --  0.87  --   --     Estimated Creatinine Clearance: 57.2 mL/min (by C-G formula based on SCr of 0.87 mg/dL).  Assessment: 67 yo f presenting with r ankle pain x 1 week - likely ischemic limb, hx PVD. Pharmacy consulted to dose heparin for ischemic limb. Not on anticoagulation PTA. Heparin level therapeutic at 0.38. CBC wnl. No bleed documented.  Goal of Therapy:  Heparin level 0.3-0.7 units/ml Monitor platelets by anticoagulation protocol: Yes   Plan:  Continue heparin IV at 1000 units/hr 6h heparin level to confirm Monitor daily heparin level and CBC, s/sx bleeding RLE bypass thrombolysis initiation 5/12  Babs Bertin, PharmD, BCPS Clinical Pharmacist Clinical phone for 05/22/2017 until 3:30pm: [phone] If after 3:30pm, please call main pharmacy at: [phone] 05/22/2017 2:14 PM   ADDENDUM:  Heparin level therapeutic at 0.33. No bleed documented.  Plan: Continue heparin IV at 1000 units/hr Monitor daily heparin level and CBC, s/sx bleeding RLE bypass thrombolysis initiation 5/12  Babs Bertin, PharmD, BCPS Clinical Pharmacist 05/22/2017 8:17 PM

## 2017-05-22 NOTE — Progress Notes (Signed)
Patient arrived from the ED on a stretcher, placed on tele ccmd notified, assessment completed see flowsheet, patient oriented to room and staff, bed in lowest position, call bell within reach will continue to monitor.

## 2017-05-22 NOTE — Progress Notes (Signed)
ANTICOAGULATION CONSULT NOTE  Pharmacy Consult for heparin Indication: ischemic limb  Allergies  Allergen Reactions  . Naproxen Itching  . Tramadol Hcl Itching    Patient Measurements: Height:  (157.5 cm) Weight: 140 lb 6.9 oz (63.7 kg) IBW/kg (Calculated) : 50.1  Vital Signs: Temp: 98.4 F (36.9 C) (05/11 0445) Temp Source: Oral (05/11 0445) BP: 157/49 (05/11 0445) Pulse Rate: 61 (05/11 0445)  Labs: Recent Labs    05/30/2017 2242 06/06/2017 2254 05/22/17 0351  HGB 11.3*  --  10.9*  HCT 34.1*  --  31.6*  PLT 228  --  244  LABPROT  --  11.8  --   INR  --  0.87  --   HEPARINUNFRC  --   --  <0.10*  CREATININE  --  0.87  --     Estimated Creatinine Clearance: 57.2 mL/min (by C-G formula based on SCr of 0.87 mg/dL).  Assessment: 67 y.o. female with occluded fem pop bypass graft for heparin Goal of Therapy:  Heparin level 0.3-0.7 units/ml Monitor platelets by anticoagulation protocol: Yes   Plan:  Heparin 2000 units IV bolus, then increase heparin 1000 units/hr Check heparin level in 6 hours.   Geannie Risen, PharmD, BCPS   05/22/2017 4:48 AM

## 2017-05-22 NOTE — Progress Notes (Addendum)
  Progress Note    05/22/2017 9:01 AM * No surgery found *  Subjective:  Pain better controlled with pain medication and IV heparin per patient   Vitals:   05/22/17 0055 05/22/17 0445  BP: (!) 144/51 (!) 157/49  Pulse:  61  Resp: (!) 21 16  Temp: 98.8 F (37.1 C) 98.4 F (36.9 C)  SpO2: 97% 98%   Physical Exam: Lungs:  Non labored Extremities:  Feet are symmetrically warm to touch; sensation and motor intact R foot Abdomen:  Soft Neurologic: A&O  CBC    Component Value Date/Time   WBC 7.0 05/22/2017 0351   RBC 3.51 (L) 05/22/2017 0351   HGB 10.9 (L) 05/22/2017 0351   HGB 10.8 (L) 07/01/2016 1445   HCT 31.6 (L) 05/22/2017 0351   HCT 32.9 (L) 07/01/2016 1445   PLT 244 05/22/2017 0351   PLT 340 07/01/2016 1445   MCV 90.0 05/22/2017 0351   MCV 88 07/01/2016 1445   MCH 31.1 05/22/2017 0351   MCHC 34.5 05/22/2017 0351   RDW 13.5 05/22/2017 0351   RDW 15.5 (H) 07/01/2016 1445   LYMPHSABS 2.1 11/23/2016 0823   LYMPHSABS 3.0 07/01/2016 1445   MONOABS 0.3 11/23/2016 0823   EOSABS 0.3 11/23/2016 0823   EOSABS 0.4 07/01/2016 1445   BASOSABS 0.0 11/23/2016 0823   BASOSABS 0.0 07/01/2016 1445    BMET    Component Value Date/Time   NA 134 (L) 06/07/2017 2254   NA 140 04/14/2016 1431   K 4.5 05/28/2017 2254   CL 102 05/12/2017 2254   CO2 22 05/18/2017 2254   GLUCOSE 474 (H) 06/09/2017 2254   BUN 25 (H) 06/02/2017 2254   BUN 19 04/14/2016 1431   CREATININE 0.87 05/20/2017 2254   CALCIUM 9.0 06/09/2017 2254   GFRNONAA >60 05/12/2017 2254   GFRAA >60 05/17/2017 2254    INR    Component Value Date/Time   INR 0.87 06/10/2017 2254     Intake/Output Summary (Last 24 hours) at 05/22/2017 0901 Last data filed at 05/22/2017 0356 Gross per 24 hour  Intake 165.82 ml  Output -  Net 165.82 ml     Assessment/Plan:  67 y.o. female with occluded R femoral to popliteal bypass  No limb threatening signs or symptoms noted at this time Continue IV heparin and pain  medication Plan will be for RLE bypass thrombolysis initiation tomorrow 5/12 by Dr. Randie Heinz Npo midnight Procedure permit will be obtained by nursing staff  DVT prophylaxis:  IV heparin   Emilie Rutter, PA-C [facility] [phone] 05/22/2017 9:01 AM  I have independently interviewed and examined patient and agree with PA assessment and plan above.  Does not appear to have limb threatening ischemia as pain is resolved with IV heparin and pain control and she is sensorimotor intact this morning.  We will plan for aortogram with possible placement of lytic catheter tomorrow in the operating room.  This may need conversion to open embolectomy.  She will be n.p.o. past midnight.  Lizbet Cirrincione C. Randie Heinz, MD [facility] [place] Office: [phone] Pager: [phone]

## 2017-05-23 ENCOUNTER — Encounter (HOSPITAL_COMMUNITY): Admission: EM | Disposition: E | Payer: Self-pay | Source: Home / Self Care | Attending: Vascular Surgery

## 2017-05-23 ENCOUNTER — Inpatient Hospital Stay (HOSPITAL_COMMUNITY): Payer: BLUE CROSS/BLUE SHIELD | Admitting: Anesthesiology

## 2017-05-23 HISTORY — PX: AORTOGRAM: SHX6300

## 2017-05-23 LAB — CBC
HCT: 33.5 % — ABNORMAL LOW (ref 36.0–46.0)
HCT: 34 % — ABNORMAL LOW (ref 36.0–46.0)
HCT: 35.6 % — ABNORMAL LOW (ref 36.0–46.0)
HEMATOCRIT: 33.5 % — AB (ref 36.0–46.0)
HEMOGLOBIN: 10.7 g/dL — AB (ref 12.0–15.0)
Hemoglobin: 10.7 g/dL — ABNORMAL LOW (ref 12.0–15.0)
Hemoglobin: 10.9 g/dL — ABNORMAL LOW (ref 12.0–15.0)
Hemoglobin: 11.6 g/dL — ABNORMAL LOW (ref 12.0–15.0)
MCH: 29.6 pg (ref 26.0–34.0)
MCH: 29.2 pg (ref 26.0–34.0)
MCH: 29.7 pg (ref 26.0–34.0)
MCH: 29.7 pg (ref 26.0–34.0)
MCHC: 31.9 g/dL (ref 30.0–36.0)
MCHC: 31.9 g/dL (ref 30.0–36.0)
MCHC: 32.1 g/dL (ref 30.0–36.0)
MCHC: 32.6 g/dL (ref 30.0–36.0)
MCV: 92.5 fL (ref 78.0–100.0)
MCV: 91.5 fL (ref 78.0–100.0)
MCV: 92.6 fL (ref 78.0–100.0)
MCV: 91.3 fL (ref 78.0–100.0)
PLATELETS: 234 10*3/uL (ref 150–400)
PLATELETS: 199 10*3/uL (ref 150–400)
Platelets: 224 10*3/uL (ref 150–400)
Platelets: 202 10*3/uL (ref 150–400)
RBC: 3.62 MIL/uL — ABNORMAL LOW (ref 3.87–5.11)
RBC: 3.66 MIL/uL — ABNORMAL LOW (ref 3.87–5.11)
RBC: 3.67 MIL/uL — ABNORMAL LOW (ref 3.87–5.11)
RBC: 3.9 MIL/uL (ref 3.87–5.11)
RDW: 13.5 % (ref 11.5–15.5)
RDW: 13.4 % (ref 11.5–15.5)
RDW: 13.5 % (ref 11.5–15.5)
RDW: 13.6 % (ref 11.5–15.5)
WBC: 7.3 10*3/uL (ref 4.0–10.5)
WBC: 8.7 10*3/uL (ref 4.0–10.5)
WBC: 11.4 10*3/uL — ABNORMAL HIGH (ref 4.0–10.5)
WBC: 12 10*3/uL — AB (ref 4.0–10.5)

## 2017-05-23 LAB — HEPARIN LEVEL (UNFRACTIONATED)
Heparin Unfractionated: 0.14 IU/mL — ABNORMAL LOW (ref 0.30–0.70)
Heparin Unfractionated: 1.23 IU/mL — ABNORMAL HIGH (ref 0.30–0.70)
Heparin Unfractionated: <0.1 IU/mL — ABNORMAL LOW (ref 0.30–0.70)
Heparin Unfractionated: <0.1 IU/mL — ABNORMAL LOW (ref 0.30–0.70)

## 2017-05-23 LAB — GLUCOSE, CAPILLARY
GLUCOSE-CAPILLARY: 252 mg/dL — AB (ref 65–99)
GLUCOSE-CAPILLARY: 219 mg/dL — AB (ref 65–99)
GLUCOSE-CAPILLARY: 142 mg/dL — AB (ref 65–99)
Glucose-Capillary: 112 mg/dL — ABNORMAL HIGH (ref 65–99)
Glucose-Capillary: 173 mg/dL — ABNORMAL HIGH (ref 65–99)

## 2017-05-23 LAB — FIBRINOGEN
FIBRINOGEN: 281 mg/dL (ref 210–475)
Fibrinogen: 494 mg/dL — ABNORMAL HIGH (ref 210–475)
Fibrinogen: 408 mg/dL (ref 210–475)

## 2017-05-23 SURGERY — AORTOGRAM
Anesthesia: Monitor Anesthesia Care | Site: Groin | Laterality: Right

## 2017-05-23 MED ORDER — ONDANSETRON HCL 4 MG/2ML IJ SOLN
INTRAMUSCULAR | Status: AC
  Filled 2017-05-23: qty 2

## 2017-05-23 MED ORDER — MORPHINE SULFATE (PF) 4 MG/ML IV SOLN
5.0000 mg | INTRAVENOUS | Status: DC | PRN
  Administered 2017-05-23 – 2017-05-24 (×8): 5 mg via INTRAVENOUS
  Filled 2017-05-23 (×9): qty 2

## 2017-05-23 MED ORDER — FENTANYL CITRATE (PF) 100 MCG/2ML IJ SOLN
INTRAMUSCULAR | Status: DC | PRN
  Administered 2017-05-23: 50 ug via INTRAVENOUS

## 2017-05-23 MED ORDER — LABETALOL HCL 5 MG/ML IV SOLN: 10.0000 mg | INTRAVENOUS | Status: DC | PRN

## 2017-05-23 MED ORDER — SODIUM CHLORIDE 0.9 % IV SOLN
INTRAVENOUS | Status: AC
  Filled 2017-05-23: qty 1.2

## 2017-05-23 MED ORDER — 0.9 % SODIUM CHLORIDE (POUR BTL) OPTIME
TOPICAL | Status: DC | PRN
  Administered 2017-05-23: 1000 mL

## 2017-05-23 MED ORDER — LIDOCAINE HCL (PF) 1 % IJ SOLN
INTRAMUSCULAR | Status: AC
  Filled 2017-05-23: qty 30

## 2017-05-23 MED ORDER — HYDRALAZINE HCL 20 MG/ML IJ SOLN
5.0000 mg | INTRAMUSCULAR | Status: AC | PRN
  Administered 2017-05-23 (×2): 5 mg via INTRAVENOUS
  Filled 2017-05-23: qty 1

## 2017-05-23 MED ORDER — HEPARIN SODIUM (PORCINE) 1000 UNIT/ML IJ SOLN
INTRAMUSCULAR | Status: DC | PRN
  Administered 2017-05-23: 5000 [IU] via INTRAVENOUS

## 2017-05-23 MED ORDER — ONDANSETRON HCL 4 MG/2ML IJ SOLN
4.0000 mg | Freq: Four times a day (QID) | INTRAMUSCULAR | Status: DC | PRN
  Administered 2017-05-23 – 2017-05-24 (×2): 4 mg via INTRAVENOUS
  Filled 2017-05-23 (×2): qty 2

## 2017-05-23 MED ORDER — ONDANSETRON HCL 4 MG/2ML IJ SOLN: 4.0000 mg | Freq: Once | INTRAMUSCULAR | Status: DC | PRN

## 2017-05-23 MED ORDER — SODIUM CHLORIDE 0.9% FLUSH: 3.0000 mL | INTRAVENOUS | Status: DC | PRN

## 2017-05-23 MED ORDER — ONDANSETRON HCL 4 MG/2ML IJ SOLN
INTRAMUSCULAR | Status: DC | PRN
  Administered 2017-05-23: 4 mg via INTRAVENOUS

## 2017-05-23 MED ORDER — FENTANYL CITRATE (PF) 100 MCG/2ML IJ SOLN
INTRAMUSCULAR | Status: AC
  Administered 2017-05-23: 50 ug via INTRAVENOUS
  Filled 2017-05-23: qty 2

## 2017-05-23 MED ORDER — CEFAZOLIN SODIUM-DEXTROSE 2-3 GM-%(50ML) IV SOLR
INTRAVENOUS | Status: DC | PRN
  Administered 2017-05-23: 2 g via INTRAVENOUS

## 2017-05-23 MED ORDER — MIDAZOLAM HCL 2 MG/2ML IJ SOLN: 1.0000 mg | INTRAMUSCULAR | Status: DC | PRN

## 2017-05-23 MED ORDER — FENTANYL CITRATE (PF) 100 MCG/2ML IJ SOLN
25.0000 ug | INTRAMUSCULAR | Status: DC | PRN
  Administered 2017-05-23 (×2): 50 ug via INTRAVENOUS

## 2017-05-23 MED ORDER — LIDOCAINE HCL (PF) 1 % IJ SOLN
INTRAMUSCULAR | Status: DC | PRN
  Administered 2017-05-23: 10 mL

## 2017-05-23 MED ORDER — PROPOFOL 500 MG/50ML IV EMUL
INTRAVENOUS | Status: DC | PRN
  Administered 2017-05-23: 50 ug/kg/min via INTRAVENOUS

## 2017-05-23 MED ORDER — IODIXANOL 320 MG/ML IV SOLN
INTRAVENOUS | Status: DC | PRN
  Administered 2017-05-23: 18 mL via INTRA_ARTERIAL

## 2017-05-23 MED ORDER — PROPOFOL 10 MG/ML IV BOLUS
INTRAVENOUS | Status: AC
  Filled 2017-05-23: qty 20

## 2017-05-23 MED ORDER — DEXAMETHASONE SODIUM PHOSPHATE 10 MG/ML IJ SOLN
INTRAMUSCULAR | Status: AC
  Filled 2017-05-23: qty 1

## 2017-05-23 MED ORDER — HEPARIN (PORCINE) IN NACL 100-0.45 UNIT/ML-% IJ SOLN
1000.0000 [IU]/h | INTRAMUSCULAR | Status: DC
  Administered 2017-05-23 – 2017-05-24 (×2): 500 [IU]/h via INTRAVENOUS
  Filled 2017-05-23: qty 250

## 2017-05-23 MED ORDER — FENTANYL CITRATE (PF) 250 MCG/5ML IJ SOLN
INTRAMUSCULAR | Status: AC
  Filled 2017-05-23: qty 5

## 2017-05-23 MED ORDER — EPHEDRINE SULFATE 50 MG/ML IJ SOLN
INTRAMUSCULAR | Status: DC | PRN
  Administered 2017-05-23: 5 mg via INTRAVENOUS

## 2017-05-23 MED ORDER — SODIUM CHLORIDE 0.9 % IV SOLN
1.0000 mg/h | INTRAVENOUS | Status: DC
  Administered 2017-05-23 (×2): 1 mg/h
  Filled 2017-05-23 (×4): qty 10

## 2017-05-23 MED ORDER — SODIUM CHLORIDE 0.9 % IV SOLN
INTRAVENOUS | Status: DC | PRN
  Administered 2017-05-23 (×2): 500 mL

## 2017-05-23 MED ORDER — SODIUM CHLORIDE 0.9% FLUSH
3.0000 mL | Freq: Two times a day (BID) | INTRAVENOUS | Status: DC
  Administered 2017-05-23 – 2017-05-25 (×4): 3 mL via INTRAVENOUS

## 2017-05-23 MED ORDER — SODIUM CHLORIDE 0.9 % IV SOLN
250.0000 mL | INTRAVENOUS | Status: DC | PRN
  Administered 2017-05-23: 250 mL via INTRAVENOUS
  Administered 2017-05-25: 10:00:00 via INTRAVENOUS

## 2017-05-23 MED ORDER — CEFAZOLIN SODIUM-DEXTROSE 1-4 GM/50ML-% IV SOLN
1.0000 g | Freq: Three times a day (TID) | INTRAVENOUS | Status: DC
  Administered 2017-05-23 – 2017-05-26 (×9): 1 g via INTRAVENOUS
  Filled 2017-05-23 (×11): qty 50

## 2017-05-23 MED ORDER — MIDAZOLAM HCL 2 MG/2ML IJ SOLN
INTRAMUSCULAR | Status: AC
  Filled 2017-05-23: qty 2

## 2017-05-23 MED ORDER — MIDAZOLAM HCL 5 MG/5ML IJ SOLN
INTRAMUSCULAR | Status: DC | PRN
  Administered 2017-05-23: 1 mg via INTRAVENOUS

## 2017-05-23 SURGICAL SUPPLY — 63 items
ADH SKN CLS APL DERMABOND .7 (GAUZE/BANDAGES/DRESSINGS) ×1
BAG SNAP BAND KOVER 36X36 (MISCELLANEOUS) ×3 IMPLANT
BLADE SURG 11 STRL SS (BLADE) ×1 IMPLANT
BLADE SURG 15 STRL LF DISP TIS (BLADE) ×1 IMPLANT
BLADE SURG 15 STRL SS (BLADE) ×3
CANISTER SUCT 3000ML PPV (MISCELLANEOUS) ×3 IMPLANT
CATH ANGIO 5F BER 65CM (CATHETERS) IMPLANT
CATH CROSS OVER TEMPO 5F (CATHETERS) IMPLANT
CATH INFUS 135CMX50CM (CATHETERS) ×2 IMPLANT
CATH OMNI FLUSH .035X70CM (CATHETERS) ×3 IMPLANT
CATH QUICKCROSS SUPP .035X90CM (MICROCATHETER) ×2 IMPLANT
COVER BACK TABLE 80X110 HD (DRAPES) ×6 IMPLANT
COVER DOME SNAP 22 D (MISCELLANEOUS) ×3 IMPLANT
COVER PROBE W GEL 5X96 (DRAPES) ×3 IMPLANT
COVER SURGICAL LIGHT HANDLE (MISCELLANEOUS) ×3 IMPLANT
DERMABOND ADVANCED (GAUZE/BANDAGES/DRESSINGS) ×2
DERMABOND ADVANCED .7 DNX12 (GAUZE/BANDAGES/DRESSINGS) ×1 IMPLANT
DEVICE TORQUE H2O (MISCELLANEOUS) ×2 IMPLANT
DEVICE TORQUE KENDALL .025-038 (MISCELLANEOUS) ×2 IMPLANT
DRAPE FEMORAL ANGIO 80X135IN (DRAPES) ×1 IMPLANT
DRSG TEGADERM 4X4.75 (GAUZE/BANDAGES/DRESSINGS) ×2 IMPLANT
ELECT REM PT RETURN 9FT ADLT (ELECTROSURGICAL)
ELECTRODE REM PT RTRN 9FT ADLT (ELECTROSURGICAL) IMPLANT
GAUZE SPONGE 4X4 12PLY STRL LF (GAUZE/BANDAGES/DRESSINGS) ×2 IMPLANT
GAUZE SPONGE 4X4 16PLY XRAY LF (GAUZE/BANDAGES/DRESSINGS) ×1 IMPLANT
GLOVE BIO SURGEON STRL SZ 6.5 (GLOVE) ×2 IMPLANT
GLOVE BIO SURGEON STRL SZ7.5 (GLOVE) ×3 IMPLANT
GLOVE BIO SURGEONS STRL SZ 6.5 (GLOVE) ×2
GLOVE BIOGEL PI IND STRL 6.5 (GLOVE) IMPLANT
GLOVE BIOGEL PI INDICATOR 6.5 (GLOVE) ×8
GOWN STRL REUS W/ TWL LRG LVL3 (GOWN DISPOSABLE) ×2 IMPLANT
GOWN STRL REUS W/ TWL XL LVL3 (GOWN DISPOSABLE) ×1 IMPLANT
GOWN STRL REUS W/TWL LRG LVL3 (GOWN DISPOSABLE) ×6
GOWN STRL REUS W/TWL XL LVL3 (GOWN DISPOSABLE) ×3
GUIDEWIRE ANGLED .035X150CM (WIRE) IMPLANT
GUIDEWIRE ANGLED .035X260CM (WIRE) ×2 IMPLANT
INTRODUCER AVANTI 6FR (MISCELLANEOUS) ×2 IMPLANT
KIT BASIN OR (CUSTOM PROCEDURE TRAY) ×3 IMPLANT
KIT TURNOVER KIT B (KITS) ×3 IMPLANT
NDL PERC 18GX7CM (NEEDLE) ×1 IMPLANT
NEEDLE PERC 18GX7CM (NEEDLE) IMPLANT
NS IRRIG 1000ML POUR BTL (IV SOLUTION) IMPLANT
PACK PERIPHERAL VASCULAR (CUSTOM PROCEDURE TRAY) ×2 IMPLANT
PAD ARMBOARD 7.5X6 YLW CONV (MISCELLANEOUS) ×6 IMPLANT
PROTECTION STATION PRESSURIZED (MISCELLANEOUS) ×3
SET MICROPUNCTURE 5F STIFF (MISCELLANEOUS) ×4 IMPLANT
SHEATH AVANTI 11CM 5FR (SHEATH) IMPLANT
SHEATH FLEX ANSEL ANG 5F 45CM (SHEATH) ×2 IMPLANT
SHEATH FLEX ANSEL ANG 6F 45CM (SHEATH) ×2 IMPLANT
STATION PROTECTION PRESSURIZED (MISCELLANEOUS) IMPLANT
STOPCOCK MORSE 400PSI 3WAY (MISCELLANEOUS) ×1 IMPLANT
SUT SILK 2 0 PERMA HAND 18 BK (SUTURE) ×2 IMPLANT
SUT SILK 2 0 SH (SUTURE) IMPLANT
SYR 10ML LL (SYRINGE) ×9 IMPLANT
SYR 20CC LL (SYRINGE) ×3 IMPLANT
SYR 30ML LL (SYRINGE) ×3 IMPLANT
SYR MEDRAD MARK V 150ML (SYRINGE) IMPLANT
TOWEL GREEN STERILE (TOWEL DISPOSABLE) ×6 IMPLANT
TUBING HIGH PRESSURE 120CM (CONNECTOR) IMPLANT
UNDERPAD 30X30 (UNDERPADS AND DIAPERS) IMPLANT
WATER STERILE IRR 1000ML POUR (IV SOLUTION) IMPLANT
WIRE BENTSON .035X145CM (WIRE) ×1 IMPLANT
WIRE ROSEN 145CM (WIRE) ×2 IMPLANT

## 2017-05-23 NOTE — Progress Notes (Signed)
Dr Randie Heinz into see patient , pulled back TPA shealth  Catheter marked  And taped in position. Redressed right femoral as it was soaked through 4x4. Covered with tegaderm

## 2017-05-23 NOTE — Progress Notes (Signed)
Call to pharmacy about TPA infusion - not ready at this time - will be sent to [facility]. Dr Randie Heinz updated on pt status- no change in pulses (sensation intact to RLE, no doppler PT/DP pulses; R femoral vascular site continues to have  bloody drainage. RN has reinforced dressing with gauze. Dr Randie Heinz aware that TPA will be started on 2H. No other changes at this time

## 2017-05-23 NOTE — Progress Notes (Signed)
Increasing in pain down right leg, continues with no dopplerable pulses PT DP, but doppler in popliteal. MD aware. Pain medication provided. Patient resting in between pain meds. Sipping on gingerale and tolerating.

## 2017-05-23 NOTE — Op Note (Signed)
Patient name: Andrea [name] MRN: [id number] DOB: 1950/07/01 Sex: female  06/05/2017 Pre-operative Diagnosis: Occluded right lower extremity bypass graft Post-operative diagnosis:  Same Surgeon:  Erlene Quan C. Donzetta Matters, MD Procedure Performed: 1.  Ultrasound-guided cannulation left common femoral artery 2.  Aortogram 3.  Right lower extremity angiogram 4.  Placement of 50 cm treatment length TPA infusion catheter and right femoral to popliteal bypass graft   Indications: 67 year old female has undergone a right femoral to popliteal artery bypass.  She also has on the left side external iliac stenting as well as the graft that was infected and had to be removed and she now has Biobond stenting from the left SFA through the popliteal serving as an endovascular bypass.  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  More recently she has had right leg pain existing for 1 week with a clinically occluded bypass graft on the right.  She is now indicated for angiogram with possible placement of lytic catheter.  Findings: The aorta has a very narrow bifurcation.  The external iliac artery on the right appears to have some disease and this can be addressed in the future.  The bypass graft is occluded.  Distally there is very minimal flow past the bypass.  There appears to be either acute thrombus or existing hyperplastic disease at the tibioperoneal trunk.  A 50 cm treatment length catheter was placed from the TP trunk back to the takeoff of the graft and the common femoral artery.  The runoff to the foot appears to be the posterior tibial and peroneal arteries.   Procedure:  The patient was identified in the holding area and taken to the operating room where she was placed supine on the operative table under MAC anesthesia was induced. she was sterilely prepped and draped in the bilateral groins in the usual fashion given antibiotics and timeout was called.  We used ultrasound guidance to evaluate  the left common femoral artery where there was significant scar tissue.  1% lidocaine was instilled in the skin and subcutaneous tissue.  The common femoral artery on the left was noted to be patent and we also identified the left SFA stent.  An image was saved to the permanent record from the ultrasound.  The left common femoral artery was then directly cannulated well visualized with ultrasound.  We placed a micropuncture wire followed by a sheath.  I then exchanged for a Rosen wire and attempted to first place a 5 [place] sheath but then dilated with 6 [place] dilator and placed a 6 [place] sheath.  We then placed an Omni Flush catheter into the aorta and aortogram was obtained.  Using the Omni catheter and Glidewire we crossed the bifurcation and performed right lower extremity angiogram from the common femoral artery which demonstrated occluded bypass graft.  We then re-exchanged for the [facility] wire and placed a long 6 French sheath into the right common femoral artery.  Using quick cross catheter and Glidewire were then able to get all the way through the bypass graft down to the level of the below-knee popliteal artery.  I confirmed this to be intraluminal distally.  We then exchanged for a 50 cm treatment length catheter which was placed distally into the TP trunk proximally back in the common femoral artery and flushed.  The infusion wire was then placed.  Sheath was affixed to the skin with 2-0 silk suture and the sheath was flushed.  Plan will be for overnight lysis.  She  tolerated procedure well without immediate complication.  Contrast: 18 cc.   Andrea [name] C. Donzetta Matters, MD [facility] [place] Office: [phone] Pager: [phone]

## 2017-05-23 NOTE — Progress Notes (Signed)
Awaiting TPA infusion- R groin sheath intact with Heparin at 500units/hr and NS at 20ml per hour

## 2017-05-23 NOTE — Transfer of Care (Signed)
Immediate Anesthesia Transfer of Care Note  Patient: Andrea [name]  Procedure(s) Performed: AORTOGRAM,  PLACEMENT RIGHT LOWER EXTREMITY TPA INFUSION CATHETER (Right Groin)  Patient Location: PACU  Anesthesia Type:MAC  Level of Consciousness: awake, alert  and oriented  Airway & Oxygen Therapy: Patient Spontanous Breathing and Patient connected to nasal cannula oxygen  Post-op Assessment: Report given to RN and Post -op Vital signs reviewed and stable  Post vital signs: Reviewed and stable  Last Vitals:  Vitals Value Taken Time  BP 129/54 05/30/2017  9:01 AM  Temp    Pulse    Resp 13 05/19/2017  9:02 AM  SpO2    Vitals shown include unvalidated device data.  Last Pain:  Vitals:   05/22/2017 0400  TempSrc: Oral  PainSc: 7       Patients Stated Pain Goal: 1 (43/60/67 7034)  Complications: No apparent anesthesia complications

## 2017-05-23 NOTE — Anesthesia Preprocedure Evaluation (Signed)
Anesthesia Evaluation  Patient identified by MRN, date of birth, ID band Patient awake    Reviewed: Allergy & Precautions, NPO status , Patient's Chart, lab work & pertinent test results, reviewed documented beta blocker date and time   History of Anesthesia Complications (+) PONV and history of anesthetic complications  Airway Mallampati: II  TM Distance: >3 FB Neck ROM: Full    Dental  (+) Edentulous Upper, Dental Advisory Given, Missing, Poor Dentition   Pulmonary shortness of breath, asthma , former smoker,    breath sounds clear to auscultation       Cardiovascular hypertension, Pt. on home beta blockers + angina + CAD, + Cardiac Stents, + CABG and + Peripheral Vascular Disease   Rhythm:Regular Rate:Normal     Neuro/Psych PSYCHIATRIC DISORDERS    GI/Hepatic Neg liver ROS, GERD  Medicated,  Endo/Other  diabetes, Type 2, Insulin Dependent, Oral Hypoglycemic Agents  Renal/GU negative Renal ROS     Musculoskeletal  (+) Arthritis , Fibromyalgia -  Abdominal   Peds  Hematology  (+) Blood dyscrasia (Plavix), anemia ,   Anesthesia Other Findings   Reproductive/Obstetrics                            Anesthesia Physical  Anesthesia Plan  ASA: III  Anesthesia Plan: MAC   Post-op Pain Management:    Induction: Intravenous  PONV Risk Score and Plan: 3 and Treatment may vary due to age or medical condition, Ondansetron and Propofol infusion  Airway Management Planned: Simple Face Mask  Additional Equipment:   Intra-op Plan:   Post-operative Plan:   Informed Consent: I have reviewed the patients History and Physical, chart, labs and discussed the procedure including the risks, benefits and alternatives for the proposed anesthesia with the patient or authorized representative who has indicated his/her understanding and acceptance.   Dental advisory given  Plan Discussed with: CRNA and  Anesthesiologist  Anesthesia Plan Comments:        Anesthesia Quick Evaluation

## 2017-05-23 NOTE — Progress Notes (Signed)
ANTICOAGULATION CONSULT NOTE  Pharmacy Consult for heparin Indication: ischemic limb  Allergies  Allergen Reactions  . Naproxen Itching  . Tramadol Hcl Itching    Patient Measurements: Height:  (157.5 cm) Weight: 140 lb 6.9 oz (63.7 kg) IBW/kg (Calculated) : 50.1  Vital Signs: Temp: 97.8 F (36.6 C) (05/12 0955) Temp Source: Oral (05/12 0400) BP: 136/53 (05/12 0955) Pulse Rate: 61 (05/12 0955)  Labs: Recent Labs    06/07/2017 2254  05/22/17 0351 05/22/17 1124 05/22/17 1942 06/03/2017 0344 05/29/2017 0955  HGB  --   --  10.9*  --   --  10.7* 10.7*  HCT  --   --  31.6*  --   --  33.5* 33.5*  PLT  --   --  244  --   --  234 224  LABPROT 11.8  --   --   --   --   --   --   INR 0.87  --   --   --   --   --   --   HEPARINUNFRC  --    < > <0.10* 0.38 0.33 0.14*  --   CREATININE 0.87  --   --   --   --   --   --    < > = values in this interval not displayed.    Estimated Creatinine Clearance: 57.2 mL/min (by C-G formula based on SCr of 0.87 mg/dL).  Assessment: 67 yo f presenting with r ankle pain x 1 week - likely ischemic limb, hx PVD. Pharmacy consulted to dose heparin for ischemic limb. Not on anticoagulation PTA.  Patient taken to OR 5/12am for examination this am, infusion wire placed, will continue on lysis protocol with heparin and tpa via wire overnight.  Vascular is managing medications, pharmacy to follow labs peripherally.   Goal of Therapy:  Heparin level 0.3-0.7 units/ml Monitor platelets by anticoagulation protocol: Yes   Plan:  Continue lysis overnight  Sheppard Coil PharmD., BCPS Clinical Pharmacist 05/17/2017 10:38 AM

## 2017-05-23 NOTE — Progress Notes (Signed)
  Progress Note    05/30/2017 7:22 AM Day of Surgery  Subjective:   having worse foot pain today on the right  Vitals:   05/22/17 2352 05/19/2017 0400  BP: (!) 157/52 (!) 145/64  Pulse: 72 (!) 58  Resp:  14  Temp:  98.7 F (37.1 C)  SpO2:  99%    Physical Exam: Awake alert and oriented Nonlabored respirations Abdomen is soft Palpable femoral pulses bilaterally Both feet are warm and appear well-perfused Right foot has sensation and motor intact  CBC    Component Value Date/Time   WBC 7.3 05/17/2017 0344   RBC 3.62 (L) 05/18/2017 0344   HGB 10.7 (L) 05/20/2017 0344   HGB 10.8 (L) 07/01/2016 1445   HCT 33.5 (L) 05/22/2017 0344   HCT 32.9 (L) 07/01/2016 1445   PLT 234 05/27/2017 0344   PLT 340 07/01/2016 1445   MCV 92.5 05/12/2017 0344   MCV 88 07/01/2016 1445   MCH 29.6 05/20/2017 0344   MCHC 31.9 05/13/2017 0344   RDW 13.5 06/10/2017 0344   RDW 15.5 (H) 07/01/2016 1445   LYMPHSABS 2.1 11/23/2016 0823   LYMPHSABS 3.0 07/01/2016 1445   MONOABS 0.3 11/23/2016 0823   EOSABS 0.3 11/23/2016 0823   EOSABS 0.4 07/01/2016 1445   BASOSABS 0.0 11/23/2016 0823   BASOSABS 0.0 07/01/2016 1445    BMET    Component Value Date/Time   NA 134 (L) 05/31/2017 2254   NA 140 04/14/2016 1431   K 4.5 06/05/2017 2254   CL 102 06/06/2017 2254   CO2 22 05/15/2017 2254   GLUCOSE 474 (H) 06/10/2017 2254   BUN 25 (H) 05/14/2017 2254   BUN 19 04/14/2016 1431   CREATININE 0.87 05/30/2017 2254   CALCIUM 9.0 05/12/2017 2254   GFRNONAA >60 05/18/2017 2254   GFRAA >60 05/29/2017 2254    INR    Component Value Date/Time   INR 0.87 05/17/2017 2254     Intake/Output Summary (Last 24 hours) at 05/22/2017 0722 Last data filed at 05/12/2017 0300 Gross per 24 hour  Intake 2412.94 ml  Output 1050 ml  Net 1362.94 ml     Assessment:  67 y.o. female is here with occluded right femoropopliteal graft for 1 week.  Plan: Plan for angiogram possible initiation of lysis today I  discussed with her the expected outcomes being continued lysis throughout the night with recheck tomorrow versus intolerance lysis which would require operation.  Medicine we will be giving her very strong blood thinner and can lead to bleeding side effects including stroke and need for transfusion and cannulation site complications.  She demonstrates good understanding we will proceed to the OR this morning.   Veneta Sliter C. Randie Heinz, MD [facility] [place] Office: [phone] Pager: [phone]  06/05/2017 7:22 AM

## 2017-05-23 NOTE — Plan of Care (Signed)
In ICU for TPA via up and over shealth in left groin to right fempop graft. Patient having 8/10 pain, medicated with dilaudid for comfort. Popliteal pulse doppler in right , no pulses in feet Dr Randie Heinz is aware, stopped by to eval patient.

## 2017-05-23 NOTE — Anesthesia Postprocedure Evaluation (Signed)
Anesthesia Post Note  Patient: Andrea [name]  Procedure(s) Performed: AORTOGRAM,  PLACEMENT RIGHT LOWER EXTREMITY TPA INFUSION CATHETER (Right Groin)     Patient location during evaluation: Endoscopy Anesthesia Type: MAC Level of consciousness: awake and alert Pain management: pain level controlled Vital Signs Assessment: post-procedure vital signs reviewed and stable Respiratory status: spontaneous breathing, nonlabored ventilation and respiratory function stable Cardiovascular status: stable and blood pressure returned to baseline Postop Assessment: no apparent nausea or vomiting Anesthetic complications: no    Last Vitals:  Vitals:   06/01/2017 0955 06/04/2017 1113  BP: (!) 136/53   Pulse: 61   Resp: 19   Temp: 36.6 C 37.2 C  SpO2: 94%     Last Pain:  Vitals:   06/06/2017 1113  TempSrc: Oral  PainSc:                  Catalina Gravel

## 2017-05-23 NOTE — Progress Notes (Signed)
Patient off to OR. CCMD notified. Pt escorted with OR nurses with heparin gtt at 2ml/hr and NS at 49ml/hr. VSS, denies any pain and discomfort at the moment.

## 2017-05-23 NOTE — Progress Notes (Signed)
Orthopedic Tech Progress Note Patient Details:  Andrea [name] [birth year] [id number]  Ortho Devices Type of Ortho Device: Knee Immobilizer Ortho Device/Splint Location: lle Ortho Device/Splint Interventions: Application   Post Interventions Patient Tolerated: Well Instructions Provided: Care of device   Nikki Dom 06-12-17, 10:34 AM

## 2017-05-24 ENCOUNTER — Inpatient Hospital Stay (HOSPITAL_COMMUNITY): Payer: BLUE CROSS/BLUE SHIELD | Admitting: Certified Registered"

## 2017-05-24 ENCOUNTER — Encounter (HOSPITAL_COMMUNITY): Payer: Self-pay | Admitting: Vascular Surgery

## 2017-05-24 ENCOUNTER — Encounter (HOSPITAL_COMMUNITY): Admission: EM | Disposition: E | Payer: Self-pay | Source: Home / Self Care | Attending: Vascular Surgery

## 2017-05-24 ENCOUNTER — Inpatient Hospital Stay (HOSPITAL_COMMUNITY): Payer: BLUE CROSS/BLUE SHIELD

## 2017-05-24 HISTORY — PX: LOWER EXTREMITY ANGIOGRAPHY: CATH118251

## 2017-05-24 HISTORY — PX: PATCH ANGIOPLASTY: SHX6230

## 2017-05-24 HISTORY — PX: PERIPHERAL VASCULAR INTERVENTION: CATH118257

## 2017-05-24 HISTORY — PX: THROMBECTOMY FEMORAL ARTERY: SHX6406

## 2017-05-24 HISTORY — PX: FEMORAL ARTERY EXPLORATION: SHX5160

## 2017-05-24 HISTORY — PX: INTRAOPERATIVE ARTERIOGRAM: SHX5157

## 2017-05-24 LAB — HEPARIN LEVEL (UNFRACTIONATED)

## 2017-05-24 LAB — BASIC METABOLIC PANEL
Anion gap: 9 (ref 5–15)
BUN: 14 mg/dL (ref 6–20)
CALCIUM: 8.6 mg/dL — AB (ref 8.9–10.3)
CO2: 24 mmol/L (ref 22–32)
CREATININE: 0.75 mg/dL (ref 0.44–1.00)
Chloride: 107 mmol/L (ref 101–111)
GFR calc Af Amer: >60 mL/min (ref >60)
Glucose, Bld: 191 mg/dL — ABNORMAL HIGH (ref 65–99)
Potassium: 4.1 mmol/L (ref 3.5–5.1)
SODIUM: 140 mmol/L (ref 135–145)

## 2017-05-24 LAB — CBC
HCT: 34.4 % — ABNORMAL LOW (ref 36.0–46.0)
HEMOGLOBIN: 11 g/dL — AB (ref 12.0–15.0)
MCH: 29.5 pg (ref 26.0–34.0)
MCHC: 32 g/dL (ref 30.0–36.0)
MCV: 92.2 fL (ref 78.0–100.0)
Platelets: 161 10*3/uL (ref 150–400)
RBC: 3.73 MIL/uL — AB (ref 3.87–5.11)
RDW: 13.8 % (ref 11.5–15.5)
WBC: 11.2 10*3/uL — AB (ref 4.0–10.5)

## 2017-05-24 LAB — GLUCOSE, CAPILLARY
GLUCOSE-CAPILLARY: 222 mg/dL — AB (ref 65–99)
Glucose-Capillary: 309 mg/dL — ABNORMAL HIGH (ref 65–99)
Glucose-Capillary: 327 mg/dL — ABNORMAL HIGH (ref 65–99)
Glucose-Capillary: 148 mg/dL — ABNORMAL HIGH (ref 65–99)
Glucose-Capillary: 166 mg/dL — ABNORMAL HIGH (ref 65–99)
Glucose-Capillary: 270 mg/dL — ABNORMAL HIGH (ref 65–99)
Glucose-Capillary: 250 mg/dL — ABNORMAL HIGH (ref 65–99)

## 2017-05-24 LAB — FIBRINOGEN: FIBRINOGEN: 119 mg/dL — AB (ref 210–475)

## 2017-05-24 SURGERY — LOWER EXTREMITY ANGIOGRAPHY
Anesthesia: LOCAL

## 2017-05-24 SURGERY — THROMBECTOMY, ARTERY, FEMORAL
Anesthesia: General | Site: Leg Lower | Laterality: Right

## 2017-05-24 MED ORDER — HEPARIN (PORCINE) IN NACL 2-0.9 UNITS/ML
INTRAMUSCULAR | Status: AC | PRN
  Administered 2017-05-24: 1000 mL via INTRA_ARTERIAL

## 2017-05-24 MED ORDER — MIDAZOLAM HCL 2 MG/2ML IJ SOLN
INTRAMUSCULAR | Status: AC
  Filled 2017-05-24: qty 2

## 2017-05-24 MED ORDER — PHENYLEPHRINE 40 MCG/ML (10ML) SYRINGE FOR IV PUSH (FOR BLOOD PRESSURE SUPPORT)
PREFILLED_SYRINGE | INTRAVENOUS | Status: DC | PRN
  Administered 2017-05-24: 80 ug via INTRAVENOUS
  Administered 2017-05-24: 40 ug via INTRAVENOUS

## 2017-05-24 MED ORDER — FENTANYL CITRATE (PF) 100 MCG/2ML IJ SOLN
INTRAMUSCULAR | Status: DC | PRN
  Administered 2017-05-24: 50 ug via INTRAVENOUS

## 2017-05-24 MED ORDER — PROPOFOL 10 MG/ML IV BOLUS
INTRAVENOUS | Status: DC | PRN
  Administered 2017-05-24: 70 mg via INTRAVENOUS

## 2017-05-24 MED ORDER — LIDOCAINE HCL (PF) 1 % IJ SOLN
INTRAMUSCULAR | Status: AC
  Filled 2017-05-24: qty 30

## 2017-05-24 MED ORDER — ONDANSETRON HCL 4 MG/2ML IJ SOLN
INTRAMUSCULAR | Status: AC
  Filled 2017-05-24: qty 2

## 2017-05-24 MED ORDER — HYDRALAZINE HCL 20 MG/ML IJ SOLN: 5.0000 mg | INTRAMUSCULAR | Status: DC | PRN

## 2017-05-24 MED ORDER — FENTANYL CITRATE (PF) 100 MCG/2ML IJ SOLN: 25.0000 ug | INTRAMUSCULAR | Status: DC | PRN

## 2017-05-24 MED ORDER — SODIUM CHLORIDE 0.9 % WEIGHT BASED INFUSION: 1.0000 mL/kg/h | INTRAVENOUS | Status: DC

## 2017-05-24 MED ORDER — SODIUM CHLORIDE 0.9 % IV SOLN: 500.0000 mL | Freq: Once | INTRAVENOUS | Status: DC | PRN

## 2017-05-24 MED ORDER — FENTANYL CITRATE (PF) 250 MCG/5ML IJ SOLN
INTRAMUSCULAR | Status: AC
  Filled 2017-05-24: qty 5

## 2017-05-24 MED ORDER — HEPARIN SODIUM (PORCINE) 1000 UNIT/ML IJ SOLN
INTRAMUSCULAR | Status: AC
  Filled 2017-05-24: qty 1

## 2017-05-24 MED ORDER — LABETALOL HCL 5 MG/ML IV SOLN: 10.0000 mg | INTRAVENOUS | Status: DC | PRN

## 2017-05-24 MED ORDER — 0.9 % SODIUM CHLORIDE (POUR BTL) OPTIME
TOPICAL | Status: DC | PRN
  Administered 2017-05-24: 1000 mL

## 2017-05-24 MED ORDER — ORAL CARE MOUTH RINSE
15.0000 mL | Freq: Two times a day (BID) | OROMUCOSAL | Status: DC
  Administered 2017-05-24 – 2017-05-25 (×3): 15 mL via OROMUCOSAL

## 2017-05-24 MED ORDER — HEPARIN SODIUM (PORCINE) 1000 UNIT/ML IJ SOLN
INTRAMUSCULAR | Status: DC | PRN
  Administered 2017-05-24: 5000 [IU] via INTRAVENOUS

## 2017-05-24 MED ORDER — DEXAMETHASONE SODIUM PHOSPHATE 10 MG/ML IJ SOLN
INTRAMUSCULAR | Status: AC
  Filled 2017-05-24: qty 1

## 2017-05-24 MED ORDER — DIPHENHYDRAMINE HCL 50 MG/ML IJ SOLN
INTRAMUSCULAR | Status: DC | PRN
  Administered 2017-05-24: 25 mg via INTRAVENOUS

## 2017-05-24 MED ORDER — IODIXANOL 320 MG/ML IV SOLN
INTRAVENOUS | Status: DC | PRN
  Administered 2017-05-24: 60 mL via INTRA_ARTERIAL

## 2017-05-24 MED ORDER — SUGAMMADEX SODIUM 200 MG/2ML IV SOLN
INTRAVENOUS | Status: AC
  Filled 2017-05-24: qty 2

## 2017-05-24 MED ORDER — ACETAMINOPHEN 325 MG PO TABS: 650.0000 mg | ORAL_TABLET | ORAL | Status: DC | PRN

## 2017-05-24 MED ORDER — PROPOFOL 10 MG/ML IV BOLUS
INTRAVENOUS | Status: AC
  Filled 2017-05-24: qty 20

## 2017-05-24 MED ORDER — SODIUM CHLORIDE 0.9 % IV SOLN
INTRAVENOUS | Status: AC
  Filled 2017-05-24: qty 1.2

## 2017-05-24 MED ORDER — FENTANYL CITRATE (PF) 100 MCG/2ML IJ SOLN
INTRAMUSCULAR | Status: DC | PRN
  Administered 2017-05-24: 100 ug via INTRAVENOUS
  Administered 2017-05-24: 50 ug via INTRAVENOUS

## 2017-05-24 MED ORDER — FENTANYL CITRATE (PF) 100 MCG/2ML IJ SOLN
INTRAMUSCULAR | Status: AC
  Filled 2017-05-24: qty 2

## 2017-05-24 MED ORDER — PROTAMINE SULFATE 10 MG/ML IV SOLN
INTRAVENOUS | Status: DC | PRN
  Administered 2017-05-24: 50 mg via INTRAVENOUS

## 2017-05-24 MED ORDER — IODIXANOL 320 MG/ML IV SOLN
INTRAVENOUS | Status: DC | PRN
  Administered 2017-05-24: 15:00:00 via INTRAMUSCULAR

## 2017-05-24 MED ORDER — ONDANSETRON HCL 4 MG/2ML IJ SOLN: 4.0000 mg | Freq: Four times a day (QID) | INTRAMUSCULAR | Status: DC | PRN

## 2017-05-24 MED ORDER — SCOPOLAMINE 1 MG/3DAYS TD PT72
MEDICATED_PATCH | TRANSDERMAL | Status: AC
  Filled 2017-05-24: qty 1

## 2017-05-24 MED ORDER — SODIUM CHLORIDE 0.9 % IV SOLN
INTRAVENOUS | Status: DC
  Administered 2017-05-24: 19:00:00 via INTRAVENOUS

## 2017-05-24 MED ORDER — POTASSIUM CHLORIDE CRYS ER 20 MEQ PO TBCR: 20.0000 meq | EXTENDED_RELEASE_TABLET | Freq: Every day | ORAL | Status: DC | PRN

## 2017-05-24 MED ORDER — SODIUM CHLORIDE 0.9% FLUSH: 3.0000 mL | INTRAVENOUS | Status: DC | PRN

## 2017-05-24 MED ORDER — EPHEDRINE SULFATE 50 MG/ML IJ SOLN
INTRAMUSCULAR | Status: DC | PRN
  Administered 2017-05-24: 10 mg via INTRAVENOUS

## 2017-05-24 MED ORDER — LIDOCAINE 2% (20 MG/ML) 5 ML SYRINGE
INTRAMUSCULAR | Status: AC
  Filled 2017-05-24: qty 15

## 2017-05-24 MED ORDER — SUGAMMADEX SODIUM 200 MG/2ML IV SOLN
INTRAVENOUS | Status: DC | PRN
  Administered 2017-05-24: 130 mg via INTRAVENOUS

## 2017-05-24 MED ORDER — PHENYLEPHRINE HCL 10 MG/ML IJ SOLN
INTRAVENOUS | Status: DC | PRN
  Administered 2017-05-24: 30 ug/min via INTRAVENOUS

## 2017-05-24 MED ORDER — MOMETASONE FURO-FORMOTEROL FUM 100-5 MCG/ACT IN AERO
2.0000 | INHALATION_SPRAY | Freq: Two times a day (BID) | RESPIRATORY_TRACT | Status: DC
  Administered 2017-05-24 – 2017-05-25 (×2): 2 via RESPIRATORY_TRACT
  Filled 2017-05-24: qty 8.8

## 2017-05-24 MED ORDER — HEPARIN (PORCINE) IN NACL 100-0.45 UNIT/ML-% IJ SOLN
1100.0000 [IU]/h | INTRAMUSCULAR | Status: DC
  Administered 2017-05-24: 1100 [IU]/h via INTRAVENOUS

## 2017-05-24 MED ORDER — ESMOLOL HCL 100 MG/10ML IV SOLN
INTRAVENOUS | Status: DC | PRN
  Administered 2017-05-24: 20 mg via INTRAVENOUS

## 2017-05-24 MED ORDER — SODIUM CHLORIDE 0.9% FLUSH: 3.0000 mL | Freq: Two times a day (BID) | INTRAVENOUS | Status: DC

## 2017-05-24 MED ORDER — ONDANSETRON HCL 4 MG/2ML IJ SOLN
INTRAMUSCULAR | Status: DC | PRN
  Administered 2017-05-24: 4 mg via INTRAVENOUS

## 2017-05-24 MED ORDER — HEMOSTATIC AGENTS (NO CHARGE) OPTIME
TOPICAL | Status: DC | PRN
  Administered 2017-05-24: 1 via TOPICAL

## 2017-05-24 MED ORDER — SCOPOLAMINE 1 MG/3DAYS TD PT72
MEDICATED_PATCH | TRANSDERMAL | Status: DC | PRN
  Administered 2017-05-24: 1 via TRANSDERMAL

## 2017-05-24 MED ORDER — LIDOCAINE HCL (PF) 1 % IJ SOLN
INTRAMUSCULAR | Status: DC | PRN
  Administered 2017-05-24: 10 mL

## 2017-05-24 MED ORDER — DEXAMETHASONE SODIUM PHOSPHATE 10 MG/ML IJ SOLN
INTRAMUSCULAR | Status: DC | PRN
  Administered 2017-05-24: 10 mg via INTRAVENOUS

## 2017-05-24 MED ORDER — MIDAZOLAM HCL 2 MG/2ML IJ SOLN
INTRAMUSCULAR | Status: DC | PRN
  Administered 2017-05-24: 1 mg via INTRAVENOUS

## 2017-05-24 MED ORDER — NALOXONE HCL 0.4 MG/ML IJ SOLN
INTRAMUSCULAR | Status: AC
  Filled 2017-05-24: qty 1

## 2017-05-24 MED ORDER — SODIUM CHLORIDE 0.9 % IV SOLN
INTRAVENOUS | Status: DC | PRN
  Administered 2017-05-24: 14:00:00

## 2017-05-24 MED ORDER — MAGNESIUM SULFATE 2 GM/50ML IV SOLN: 2.0000 g | Freq: Every day | INTRAVENOUS | Status: DC | PRN

## 2017-05-24 MED ORDER — LIDOCAINE HCL (CARDIAC) PF 100 MG/5ML IV SOSY
PREFILLED_SYRINGE | INTRAVENOUS | Status: DC | PRN
  Administered 2017-05-24: 60 mg via INTRAVENOUS

## 2017-05-24 MED ORDER — ROCURONIUM BROMIDE 100 MG/10ML IV SOLN
INTRAVENOUS | Status: DC | PRN
  Administered 2017-05-24: 40 mg via INTRAVENOUS

## 2017-05-24 MED ORDER — HEPARIN (PORCINE) IN NACL 1000-0.9 UT/500ML-% IV SOLN
INTRAVENOUS | Status: AC
  Filled 2017-05-24: qty 1000

## 2017-05-24 MED ORDER — SODIUM CHLORIDE 0.9 % IV SOLN
INTRAVENOUS | Status: DC
  Administered 2017-05-24: 20 mL/h via INTRAVENOUS

## 2017-05-24 MED ORDER — LACTATED RINGERS IV SOLN
INTRAVENOUS | Status: DC | PRN
  Administered 2017-05-24: 14:00:00 via INTRAVENOUS

## 2017-05-24 MED ORDER — HEPARIN SODIUM (PORCINE) 5000 UNIT/ML IJ SOLN
5000.0000 [IU] | Freq: Three times a day (TID) | INTRAMUSCULAR | Status: DC
  Administered 2017-05-24 – 2017-05-26 (×5): 5000 [IU] via SUBCUTANEOUS
  Filled 2017-05-24 (×5): qty 1

## 2017-05-24 MED ORDER — SODIUM CHLORIDE 0.9 % IV SOLN: 250.0000 mL | INTRAVENOUS | Status: DC | PRN

## 2017-05-24 SURGICAL SUPPLY — 73 items
ADH SKN CLS APL DERMABOND .7 (GAUZE/BANDAGES/DRESSINGS) ×4
BANDAGE ACE 4X5 VEL STRL LF (GAUZE/BANDAGES/DRESSINGS) IMPLANT
BANDAGE ESMARK 6X9 LF (GAUZE/BANDAGES/DRESSINGS) IMPLANT
BNDG CMPR 9X6 STRL LF SNTH (GAUZE/BANDAGES/DRESSINGS)
BNDG ESMARK 6X9 LF (GAUZE/BANDAGES/DRESSINGS)
CANISTER SUCT 3000ML PPV (MISCELLANEOUS) ×2 IMPLANT
CANNULA VESSEL 3MM 2 BLNT TIP (CANNULA) ×2 IMPLANT
CATH EMB 3FR 80CM (CATHETERS) ×1 IMPLANT
CATH EMB 4FR 80CM (CATHETERS) ×1 IMPLANT
CATH EMB 5FR 80CM (CATHETERS) IMPLANT
CLIP VESOCCLUDE MED 24/CT (CLIP) ×2 IMPLANT
CLIP VESOCCLUDE SM WIDE 24/CT (CLIP) ×2 IMPLANT
CUFF TOURNIQUET SINGLE 24IN (TOURNIQUET CUFF) IMPLANT
CUFF TOURNIQUET SINGLE 34IN LL (TOURNIQUET CUFF) IMPLANT
CUFF TOURNIQUET SINGLE 44IN (TOURNIQUET CUFF) IMPLANT
DERMABOND ADVANCED (GAUZE/BANDAGES/DRESSINGS) ×4
DERMABOND ADVANCED .7 DNX12 (GAUZE/BANDAGES/DRESSINGS) IMPLANT
DRAIN CHANNEL 15F RND FF W/TCR (WOUND CARE) IMPLANT
DRAPE HALF SHEET 40X57 (DRAPES) IMPLANT
DRAPE X-RAY CASS 24X20 (DRAPES) ×1 IMPLANT
ELECT REM PT RETURN 9FT ADLT (ELECTROSURGICAL) ×2
ELECTRODE REM PT RTRN 9FT ADLT (ELECTROSURGICAL) ×1 IMPLANT
EVACUATOR SILICONE 100CC (DRAIN) IMPLANT
GLOVE BIO SURGEON STRL SZ 6.5 (GLOVE) ×1 IMPLANT
GLOVE BIO SURGEON STRL SZ7.5 (GLOVE) ×2 IMPLANT
GLOVE BIOGEL PI IND STRL 6.5 (GLOVE) IMPLANT
GLOVE BIOGEL PI IND STRL 7.0 (GLOVE) IMPLANT
GLOVE BIOGEL PI IND STRL 7.5 (GLOVE) IMPLANT
GLOVE BIOGEL PI INDICATOR 6.5 (GLOVE) ×1
GLOVE BIOGEL PI INDICATOR 7.0 (GLOVE) ×1
GLOVE BIOGEL PI INDICATOR 7.5 (GLOVE) ×2
GLOVE ECLIPSE 6.5 STRL STRAW (GLOVE) ×2 IMPLANT
GLOVE SS BIOGEL STRL SZ 7.5 (GLOVE) IMPLANT
GLOVE SUPERSENSE BIOGEL SZ 7.5 (GLOVE) ×1
GOWN STRL REUS W/ TWL LRG LVL3 (GOWN DISPOSABLE) ×2 IMPLANT
GOWN STRL REUS W/ TWL XL LVL3 (GOWN DISPOSABLE) ×1 IMPLANT
GOWN STRL REUS W/TWL LRG LVL3 (GOWN DISPOSABLE) ×6
GOWN STRL REUS W/TWL XL LVL3 (GOWN DISPOSABLE) ×2
HEMOSTAT SNOW SURGICEL 2X4 (HEMOSTASIS) ×1 IMPLANT
INSERT FOGARTY SM (MISCELLANEOUS) IMPLANT
KIT BASIN OR (CUSTOM PROCEDURE TRAY) ×2 IMPLANT
KIT TURNOVER KIT B (KITS) ×2 IMPLANT
MARKER GRAFT CORONARY BYPASS (MISCELLANEOUS) IMPLANT
NS IRRIG 1000ML POUR BTL (IV SOLUTION) ×4 IMPLANT
PACK PERIPHERAL VASCULAR (CUSTOM PROCEDURE TRAY) ×2 IMPLANT
PAD ARMBOARD 7.5X6 YLW CONV (MISCELLANEOUS) ×4 IMPLANT
PATCH GORETEX 2X9 (Vascular Products) ×1 IMPLANT
SET COLLECT BLD 21X3/4 12 (NEEDLE) ×1 IMPLANT
SPONGE LAP 18X18 X RAY DECT (DISPOSABLE) ×1 IMPLANT
STOPCOCK 4 WAY LG BORE MALE ST (IV SETS) ×1 IMPLANT
SUT ETHILON 3 0 PS 1 (SUTURE) IMPLANT
SUT GORETEX 6.0 TT13 (SUTURE) IMPLANT
SUT GORETEX 6.0 TT9 (SUTURE) IMPLANT
SUT MNCRL AB 4-0 PS2 18 (SUTURE) ×3 IMPLANT
SUT PROLENE 5 0 C 1 24 (SUTURE) ×1 IMPLANT
SUT PROLENE 6 0 BV (SUTURE) ×1 IMPLANT
SUT PROLENE 6 0 CC (SUTURE) ×2 IMPLANT
SUT PROLENE 7 0 BV 1 (SUTURE) ×2 IMPLANT
SUT SILK 2 0 SH (SUTURE) ×2 IMPLANT
SUT SILK 3 0 (SUTURE)
SUT SILK 3-0 18XBRD TIE 12 (SUTURE) IMPLANT
SUT VIC AB 2-0 CT1 27 (SUTURE)
SUT VIC AB 2-0 CT1 TAPERPNT 27 (SUTURE) ×2 IMPLANT
SUT VIC AB 3-0 SH 27 (SUTURE) ×4
SUT VIC AB 3-0 SH 27X BRD (SUTURE) ×2 IMPLANT
SYR 30ML LL (SYRINGE) ×1 IMPLANT
SYR 3ML LL SCALE MARK (SYRINGE) ×1 IMPLANT
TAPE UMBILICAL COTTON 1/8X30 (MISCELLANEOUS) IMPLANT
TOWEL GREEN STERILE (TOWEL DISPOSABLE) ×2 IMPLANT
TRAY FOLEY MTR SLVR 16FR STAT (SET/KITS/TRAYS/PACK) IMPLANT
TUBING EXTENTION W/L.L. (IV SETS) ×1 IMPLANT
UNDERPAD 30X30 (UNDERPADS AND DIAPERS) ×2 IMPLANT
WATER STERILE IRR 1000ML POUR (IV SOLUTION) ×2 IMPLANT

## 2017-05-24 SURGICAL SUPPLY — 13 items
BALLN MUSTANG 6.0X40 75 (BALLOONS) ×3
BALLOON MUSTANG 6.0X40 75 (BALLOONS) IMPLANT
CATH CXI SUPP ST 2.6FR 150CM (CATHETERS) ×1 IMPLANT
DEVICE CLOSURE MYNXGRIP 6/7F (Vascular Products) ×1 IMPLANT
KIT ENCORE 26 ADVANTAGE (KITS) ×1 IMPLANT
KIT PV (KITS) ×3 IMPLANT
SHEATH PINNACLE 6F 10CM (SHEATH) ×1 IMPLANT
SHIELD RADPAD SCOOP 12X17 (MISCELLANEOUS) ×1 IMPLANT
STENT ELUVIA 7X40X130 (Permanent Stent) ×1 IMPLANT
TRANSDUCER W/STOPCOCK (MISCELLANEOUS) ×3 IMPLANT
TRAY PV CATH (CUSTOM PROCEDURE TRAY) ×3 IMPLANT
WIRE G V18X300CM (WIRE) ×1 IMPLANT
WIRE ROSEN-J .035X180CM (WIRE) ×1 IMPLANT

## 2017-05-24 NOTE — Progress Notes (Signed)
Dr. Randie Heinz notified of fibrinogen level of 119.  Orders received.  Will continue to closely monitor.

## 2017-05-24 NOTE — Anesthesia Procedure Notes (Signed)
Procedure Name: Intubation Date/Time: 05/19/2017 1:24 PM Performed by: Rosiland Oz, CRNA Pre-anesthesia Checklist: Patient identified, Emergency Drugs available, Suction available, Patient being monitored and Timeout performed Patient Re-evaluated:Patient Re-evaluated prior to induction Oxygen Delivery Method: Circle system utilized Preoxygenation: Pre-oxygenation with 100% oxygen Induction Type: IV induction Ventilation: Mask ventilation without difficulty Laryngoscope Size: Miller and 2 Grade View: Grade I Tube type: Oral Tube size: 7.0 mm Number of attempts: 1 Airway Equipment and Method: Stylet Placement Confirmation: ETT inserted through vocal cords under direct vision,  positive ETCO2 and breath sounds checked- equal and bilateral Secured at: 21 cm Tube secured with: Tape Dental Injury: Teeth and Oropharynx as per pre-operative assessment

## 2017-05-24 NOTE — Progress Notes (Signed)
ANTICOAGULATION CONSULT NOTE  Pharmacy Consult for heparin Indication: ischemic limb  Allergies  Allergen Reactions  . Naproxen Itching  . Tramadol Hcl Itching    Patient Measurements: Height:  (157.5 cm) Weight: 140 lb 6.9 oz (63.7 kg) IBW/kg (Calculated) : 50.1  Vital Signs: Temp: 98.2 F (36.8 C) (05/13 0327) Temp Source: Oral (05/13 0327) BP: 102/53 (05/13 0342) Pulse Rate: 88 (05/13 0344)  Labs: Recent Labs    05/17/2017 2254  18-Jun-2017 1434 06-18-2017 2124 06/02/2017 0239  HGB  --    < > 10.9* 11.6* 11.0*  HCT  --    < > 34.0* 35.6* 34.4*  PLT  --    < > 202 199 161  LABPROT 11.8  --   --   --   --   INR 0.87  --   --   --   --   HEPARINUNFRC  --    < > <0.10* <0.10* <0.10*  CREATININE 0.87  --   --   --  0.75   < > = values in this interval not displayed.    Estimated Creatinine Clearance: 62.2 mL/min (by C-G formula based on SCr of 0.75 mg/dL).  Assessment: 67 yo female with occluded fem pop bypass graft s/p lysis catheter placement, alteplase now stopped due to decreasing fibrinogen levels, for heparin  Goal of Therapy:  Heparin level 0.3-0.7 units/ml Monitor platelets by anticoagulation protocol: Yes   Plan:  Increase Heparin 1000 units/hr Check heparin level in 6 hours.   Geannie Risen, PharmD, BCPS  05/12/2017 4:00 AM

## 2017-05-24 NOTE — Op Note (Signed)
    OPERATIVE REPORT  DATE OF SURGERY: 05/17/2017  PATIENT: Andrea [name], 67 y.o. female MRN: [id number]  DOB: 1950-09-14  PRE-OPERATIVE DIAGNOSIS: Right foot ischemia  POST-OPERATIVE DIAGNOSIS:  Same  PROCEDURE: Right popliteal exploration and thrombectomy with Gore-Tex patch angioplasty distal femoropopliteal anastomosis, intraoperative arteriogram, exploration and thrombectomy of posterior tibial ankle at the ankle  SURGEON:  Gretta Began, M.D.  PHYSICIAN ASSISTANT: Clinton Gallant, PA-C  ANESTHESIA: General  EBL: 150 ml  Total I/O In: 1783.7 [P.O.:20; I.V.:1763.7] Out: 150 [Blood:150]  BLOOD ADMINISTERED: None  DRAINS: None  SPECIMEN: None  COUNTS CORRECT:  YES  PLAN OF CARE: PACU  PATIENT DISPOSITION:  PACU - hemodynamically stable  PROCEDURE DETAILS: Patient had a prior history of femoral-popliteal bypass and presented over the weekend with a week history of ischemia.  Arteriogram revealed distal embolus into her foot despite being able to lyse the femoral to popliteal bypass.  She is taken to the operating room for attempted thrombectomy of her tibial vessels.  Incision was made over the medial approach to the prior scar and carried down to isolate the below-knee popliteal incision.  The patient had dense scarring in the below-knee popliteal area.  The posterior tibial and peroneal arteries were isolated.  The peroneal artery was very small at the bifurcation of the tibioperoneal trunk.  Patient was given 6000 units of heparin in addition to her heparin drip.  Graft was patent at this level.  The graft was occluded with a vascular clamp and the proximal popliteal artery was occluded.  The graft was opened with an 11 blade and sent longitudinally through the anastomosis onto the popliteal artery.  A 4 Fogarty was passed through the bypass graft and there was no evidence of thrombus proximally.  This was reoccluded.  The 3 Fogarty was passed distally through the posterior  tibial artery down past the ankle and some slight amount of grayish material was removed but not a great deal of clot.  The longitudinal incision from the graft onto the artery was closed with a Gore-Tex patch with running 6-0 Prolene suture.  This anastomosis was tested and found to be adequate.  Intraoperative arteriogram revealed flow down to the ankle but no flow past this.  This was despite passing the 3 Fogarty passed this on the several attempts.  For this reason a separate incision was made over the posterior tibial artery at the ankle.  The artery had no evidence of atherosclerotic change.  The artery was occluded proximally distally and was opened transversely.  3 Fogarty with path catheter was passed into the foot but no thrombus was removed.  The catheter was passed proximally and again no thrombus was removed.  The transverse arteriotomy in the posterior tibial artery was closed with interrupted 7-0 Prolene sutures.  Clamps were removed.  There remained excellent flow at the level of the popliteal space with good flow through the posterior tibial.  The wounds were irrigated with saline.  The patient was given 100 mg of protamine to reverse heparin.  The wounds were closed with 3-0 Prolene at the ankle and the subcuticular tissue.  The popliteal incision was closed with 2-0 Vicryl in the fascia and 3 of the subcuticular tissue.  Was transferred to the recovery room in stable condition   Larina Earthly, M.D., [facility] 05/13/2017 4:43 PM

## 2017-05-24 NOTE — Transfer of Care (Signed)
Immediate Anesthesia Transfer of Care Note  Patient: Andrea [name]  Procedure(s) Performed: THROMBECTOMY POPLITEAL AND TIBIAL  ARTERY (Right Leg Lower) ANGIOPLASTY USING GORE-TEX CARDIOVASCULAR PATCH 2.0cm X 9.0cm X 0.69mm (Right Leg Lower) INTRA OPERATIVE ARTERIOGRAM (Right Leg Lower) RIGHT POPLITEAL AND TIBIAL ARTERY EXPLORATION (Right Leg Lower)  Patient Location: PACU  Anesthesia Type:General  Level of Consciousness: awake and patient cooperative  Airway & Oxygen Therapy: Patient Spontanous Breathing  Post-op Assessment: Report given to RN and Post -op Vital signs reviewed and stable  Post vital signs: Reviewed and stable  Last Vitals:  Vitals Value Taken Time  BP    Temp    Pulse    Resp    SpO2      Last Pain:  Vitals:   05/20/2017 1148  TempSrc:   PainSc: 4       Patients Stated Pain Goal: 3 (06/09/2017 0857)  Complications: No apparent anesthesia complications

## 2017-05-24 NOTE — Op Note (Signed)
    Patient name: Andrea [name] MRN: [id number] DOB: [birth year] Sex: female  05/13/2017 Pre-operative Diagnosis: Acute right lower extremity ischemia Post-operative diagnosis:  Same Surgeon:  Apolinar Junes C. Randie Heinz, MD Procedure Performed: 1.  Right lower extremity angiogram 2.  Stent of right external iliac artery with 7 x 40mm elluvia 3.  Moderate sedation with fentanyl and Versed for 37 minutes 4.  Closure of left common femoral artery access site with minx device   Indications: 67 year old female has a history of bilateral lower extremity interventions.  On the right side she has a femoropopliteal bypass with graft steal a wound which is now healed.  She denies right foot pain.  The day prior to this procedure she underwent placement of a lytic catheter.  Her foot appears dusky on morning rounds she is taken back for lysis recheck.  Findings: The femoropopliteal bypass appears patent.  Distally there appears to be at least anastomotic stenosis.  The runoff is via the posterior tibial and peroneal arteries but this terminates in the mid calf.  We placed the catheter into the posterior tibial artery appears that she has thrombus distally in her foot.  We then evaluated her external iliac artery lesion that we knew was present from procedure yesterday.  This had approximately 70% stenosis and was stented to 0% residual stenosis.  Patient will need to go to the operating room for right lower extremity thrombi embolectomy and possible graft revision.   Procedure:  The patient was identified in the holding area and taken to room 8.  She is placed supine on the operating table and she was given fentanyl and Versed.  5000 units of heparin was administered.  We prepped and draped her left groin usual fashion timeout was called.  We exchanged the existing catheter for V 18 wire.  We then performed right lower extremity angiogram with the above findings.  I then placed a CXI catheter in the posterior tibial  artery to perform angiogram and this demonstrated that she had what appeared to be acute appearing thrombus down into her foot.  With this we then backed our sheath and performed angiogram of her external iliac artery which demonstrated her high-grade stenosis.  This was primarily stented with a 7 x 40 stent and postdilated with a 6 x 40 balloon.  Completion demonstrated no residual stenosis there is no dissection and brisk flow through the stent.  Satisfied with this we exchanged for a stiff wire and exchanged for a short 6 [place] sheath.  A minx device was then deployed in the left groin.  She tolerated procedure well without immediate complication.  Patient will be scheduled for the operating room later today.  Contrast: 60 cc.   Nathanial Arrighi C. Randie Heinz, MD [facility] [place] Office: [phone] Pager: [phone]

## 2017-05-24 NOTE — H&P (View-Only) (Signed)
  Progress Note    05/14/2017 7:34 AM Day of Surgery  Subjective: TPA stopped overnight for decrease in fibrinogen She has pain mostly in her right thigh area states that she can feel her foot  Vitals:   05/17/2017 0344 05/31/2017 0729  BP:    Pulse: 88   Resp: 20   Temp:    SpO2: 96% 98%    Physical Exam: Awake alert and oriented Nonlabored respirations Abdomen is soft Palpable femoral pulse on the right Sheath is in the left there is no hematoma The right foot appears dusky distally there are no discernible arterial signals at the level of the ankle or foot.  CBC    Component Value Date/Time   WBC 11.2 (H) 05/22/2017 0239   RBC 3.73 (L) 05/29/2017 0239   HGB 11.0 (L) 05/30/2017 0239   HGB 10.8 (L) 07/01/2016 1445   HCT 34.4 (L) 05/22/2017 0239   HCT 32.9 (L) 07/01/2016 1445   PLT 161 05/16/2017 0239   PLT 340 07/01/2016 1445   MCV 92.2 06/02/2017 0239   MCV 88 07/01/2016 1445   MCH 29.5 06/07/2017 0239   MCHC 32.0 05/18/2017 0239   RDW 13.8 05/30/2017 0239   RDW 15.5 (H) 07/01/2016 1445   LYMPHSABS 2.1 11/23/2016 0823   LYMPHSABS 3.0 07/01/2016 1445   MONOABS 0.3 11/23/2016 0823   EOSABS 0.3 11/23/2016 0823   EOSABS 0.4 07/01/2016 1445   BASOSABS 0.0 11/23/2016 0823   BASOSABS 0.0 07/01/2016 1445    BMET    Component Value Date/Time   NA 140 06/08/2017 0239   NA 140 04/14/2016 1431   K 4.1 06/03/2017 0239   CL 107 06/04/2017 0239   CO2 24 06/03/2017 0239   GLUCOSE 191 (H) 05/19/2017 0239   BUN 14 06/09/2017 0239   BUN 19 04/14/2016 1431   CREATININE 0.75 05/28/2017 0239   CALCIUM 8.6 (L) 06/09/2017 0239   GFRNONAA >60 06/03/2017 0239   GFRAA >60 06/01/2017 0239    INR    Component Value Date/Time   INR 0.87 06/10/2017 2254     Intake/Output Summary (Last 24 hours) at 05/16/2017 0734 Last data filed at 05/30/2017 0409 Gross per 24 hour  Intake 1934.25 ml  Output 55 ml  Net 1879.25 ml     Assessment:  67 y.o. 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  This is failed to resolve her occlusive disease clinically with dusky appearing foot and no signals of the ankle this morning.  TPA was stopped overnight for decrease in fibrinogen.  Plan: PV lab today for lytic recheck.  I discussed with both the patient and her sister that she may require operative intervention today given that overnight lysis did not resolve her symptoms.  They demonstrate good understanding we will start with at least diagnostic angiogram.   Lacey Dotson C. Randie Heinz, MD [facility] [place] Office: [phone] Pager: [phone]  06/09/2017 7:34 AM

## 2017-05-24 NOTE — Anesthesia Postprocedure Evaluation (Signed)
Anesthesia Post Note  Patient: Andrea [name]  Procedure(s) Performed: THROMBECTOMY POPLITEAL AND TIBIAL  ARTERY (Right Leg Lower) ANGIOPLASTY USING GORE-TEX CARDIOVASCULAR PATCH 2.0cm X 9.0cm X 0.53mm (Right Leg Lower) INTRA OPERATIVE ARTERIOGRAM (Right Leg Lower) RIGHT POPLITEAL AND TIBIAL ARTERY EXPLORATION (Right Leg Lower)     Patient location during evaluation: PACU Anesthesia Type: General Level of consciousness: awake and alert Pain management: pain level controlled Vital Signs Assessment: post-procedure vital signs reviewed and stable Respiratory status: spontaneous breathing, nonlabored ventilation and respiratory function stable Cardiovascular status: blood pressure returned to baseline and stable Postop Assessment: no apparent nausea or vomiting Anesthetic complications: no    Last Vitals:  Vitals:   05/12/2017 1725 06/04/2017 1739  BP: (!) 118/56   Pulse: 82   Resp: 16   Temp:  37.1 C  SpO2: 95%     Last Pain:  Vitals:   05/23/2017 1800  TempSrc:   PainSc: 8                  Brandin Dilday,W. EDMOND

## 2017-05-24 NOTE — Plan of Care (Signed)
  Problem: Education: Goal: Knowledge of General Education information will improve Outcome: Progressing   Problem: Health Behavior/Discharge Planning: Goal: Ability to manage health-related needs will improve Outcome: Progressing   

## 2017-05-24 NOTE — Anesthesia Preprocedure Evaluation (Addendum)
Anesthesia Evaluation  Patient identified by MRN, date of birth, ID band Patient awake    Reviewed: Allergy & Precautions, H&P , NPO status , Patient's Chart, lab work & pertinent test results, reviewed documented beta blocker date and time   History of Anesthesia Complications (+) PONV  Airway Mallampati: II  TM Distance: >3 FB Neck ROM: Full    Dental no notable dental hx. (+) Edentulous Upper, Partial Lower, Dental Advisory Given   Pulmonary asthma , former smoker,    Pulmonary exam normal breath sounds clear to auscultation       Cardiovascular hypertension, Pt. on medications and Pt. on home beta blockers + CAD, + Cardiac Stents, + Peripheral Vascular Disease and +CHF   Rhythm:Regular Rate:Normal     Neuro/Psych negative neurological ROS  negative psych ROS   GI/Hepatic Neg liver ROS, GERD  Medicated and Controlled,  Endo/Other  diabetes, Insulin Dependent  Renal/GU negative Renal ROS  negative genitourinary   Musculoskeletal  (+) Arthritis , Fibromyalgia -  Abdominal   Peds  Hematology negative hematology ROS (+)   Anesthesia Other Findings   Reproductive/Obstetrics negative OB ROS                            Anesthesia Physical Anesthesia Plan  ASA: III  Anesthesia Plan: General   Post-op Pain Management:    Induction: Intravenous  PONV Risk Score and Plan: 4 or greater and Ondansetron, Midazolam, Scopolamine patch - Pre-op and Treatment may vary due to age or medical condition  Airway Management Planned: Oral ETT  Additional Equipment:   Intra-op Plan:   Post-operative Plan: Extubation in OR  Informed Consent: I have reviewed the patients History and Physical, chart, labs and discussed the procedure including the risks, benefits and alternatives for the proposed anesthesia with the patient or authorized representative who has indicated his/her understanding and  acceptance.   Dental advisory given  Plan Discussed with: CRNA  Anesthesia Plan Comments:         Anesthesia Quick Evaluation

## 2017-05-24 NOTE — Progress Notes (Signed)
Patient transferred to 4E 23 via bed.  Responsibility was assumed by Loralyn Freshwater, RN.

## 2017-05-24 NOTE — Progress Notes (Signed)
  Progress Note    05/23/2017 7:34 AM Day of Surgery  Subjective: TPA stopped overnight for decrease in fibrinogen She has pain mostly in her right thigh area states that she can feel her foot  Vitals:   05/25/2017 0344 05/19/2017 0729  BP:    Pulse: 88   Resp: 20   Temp:    SpO2: 96% 98%    Physical Exam: Awake alert and oriented Nonlabored respirations Abdomen is soft Palpable femoral pulse on the right Sheath is in the left there is no hematoma The right foot appears dusky distally there are no discernible arterial signals at the level of the ankle or foot.  CBC    Component Value Date/Time   WBC 11.2 (H) 06/10/2017 0239   RBC 3.73 (L) 05/16/2017 0239   HGB 11.0 (L) 05/13/2017 0239   HGB 10.8 (L) 07/01/2016 1445   HCT 34.4 (L) 05/21/2017 0239   HCT 32.9 (L) 07/01/2016 1445   PLT 161 05/23/2017 0239   PLT 340 07/01/2016 1445   MCV 92.2 06/11/2017 0239   MCV 88 07/01/2016 1445   MCH 29.5 06/04/2017 0239   MCHC 32.0 05/21/2017 0239   RDW 13.8 06/02/2017 0239   RDW 15.5 (H) 07/01/2016 1445   LYMPHSABS 2.1 11/23/2016 0823   LYMPHSABS 3.0 07/01/2016 1445   MONOABS 0.3 11/23/2016 0823   EOSABS 0.3 11/23/2016 0823   EOSABS 0.4 07/01/2016 1445   BASOSABS 0.0 11/23/2016 0823   BASOSABS 0.0 07/01/2016 1445    BMET    Component Value Date/Time   NA 140 06/02/2017 0239   NA 140 04/14/2016 1431   K 4.1 05/14/2017 0239   CL 107 05/21/2017 0239   CO2 24 06/05/2017 0239   GLUCOSE 191 (H) 05/21/2017 0239   BUN 14 05/15/2017 0239   BUN 19 04/14/2016 1431   CREATININE 0.75 05/12/2017 0239   CALCIUM 8.6 (L) 05/25/2017 0239   GFRNONAA >60 05/23/2017 0239   GFRAA >60 05/20/2017 0239    INR    Component Value Date/Time   INR 0.87 05/28/2017 2254     Intake/Output Summary (Last 24 hours) at 05/30/2017 0734 Last data filed at 05/23/2017 0409 Gross per 24 hour  Intake 1934.25 ml  Output 55 ml  Net 1879.25 ml     Assessment:  66 y.o. 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  This is failed to resolve her occlusive disease clinically with dusky appearing foot and no signals of the ankle this morning.  TPA was stopped overnight for decrease in fibrinogen.  Plan: PV lab today for lytic recheck.  I discussed with both the patient and her sister that she may require operative intervention today given that overnight lysis did not resolve her symptoms.  They demonstrate good understanding we will start with at least diagnostic angiogram.   Jude Linck C. Cindia Hustead, MD [facility] [place] Office: [phone] Pager: [phone]  05/31/2017 7:34 AM  

## 2017-05-24 NOTE — Progress Notes (Signed)
Patient ID: Andrea [name], female   DOB: [birth year], 67 y.o.   MRN: [id number] The patient is just returned to 4 E.  She is alert and oriented. She does have audible posterior tibial signal at the ankle.  Foot remains cyanotic.  She has sensory intact on the dorsum of her foot.  Diminished motor function Calf is nontender.

## 2017-05-24 NOTE — Progress Notes (Signed)
ANTICOAGULATION CONSULT NOTE  Pharmacy Consult for heparin Indication: ischemic limb  Allergies  Allergen Reactions  . Naproxen Itching  . Tramadol Hcl Itching    Patient Measurements: Height:  (157.5 cm) Weight: 140 lb 6.9 oz (63.7 kg) IBW/kg (Calculated) : 50.1  Vital Signs: Temp: 98.2 F (36.8 C) (05/13 0327) Temp Source: Oral (05/13 0327) BP: 149/54 (05/13 0828) Pulse Rate: 70 (05/13 0828)  Labs: Recent Labs    06/10/2017 2254  05/22/2017 1434 05/29/2017 2124 05/30/2017 0239  HGB  --    < > 10.9* 11.6* 11.0*  HCT  --    < > 34.0* 35.6* 34.4*  PLT  --    < > 202 199 161  LABPROT 11.8  --   --   --   --   INR 0.87  --   --   --   --   HEPARINUNFRC  --    < > <0.10* <0.10* <0.10*  CREATININE 0.87  --   --   --  0.75   < > = values in this interval not displayed.    Estimated Creatinine Clearance: 62.2 mL/min (by C-G formula based on SCr of 0.75 mg/dL).  Assessment: 67 yo female with occluded fem pop bypass graft s/p lysis catheter placement, alteplase now stopped due to decreasing fibrinogen levels.  Pharmacy asked to resume heparin this AM s/p PV lab.  Per orders to start 2 hrs after sheath out, sheath removed at 0822 AM.  Heparin level previously at low end of goal range on 1000 units/hr.  Goal of Therapy:  Heparin level 0.3-0.7 units/ml Monitor platelets by anticoagulation protocol: Yes   Plan:  Resume heparin at 1100 units/hr at 1030 AM. Per RN - patient scheduled to go to OR at 1 pm today. F/u plans for IV heparin after OR today.  Jenetta Downer, BCCP Clinical Pharmacist Pager [phone]  06/10/2017 10:18 AM

## 2017-05-24 NOTE — Progress Notes (Signed)
Patient alert with periods of confusion. Telemetry called and verified.Oriented to room and surroundings.

## 2017-05-24 NOTE — Interval H&P Note (Signed)
History and Physical Interval Note:  06/07/2017 1:09 PM  Andrea [name]  has presented today for surgery, with the diagnosis of lower extremity thrombus  The various methods of treatment have been discussed with the patient and family. After consideration of risks, benefits and other options for treatment, the patient has consented to  Procedure(s): THROMBECTOMY FEMORAL ARTERY (Right) as a surgical intervention .  The patient's history has been reviewed, patient examined, no change in status, stable for surgery.  I have reviewed the patient's chart and labs.  Questions were answered to the patient's satisfaction.     Gretta Began

## 2017-05-24 NOTE — Progress Notes (Signed)
Left femoral oozing at site. No hematoma. Pressure held X 10 minutes. Pressure dressing applied. Reminded patient if she coughs, lifts head, laughs, vomits to hold pressure to her left groin. Verbalized understanding. Level 0.

## 2017-05-25 ENCOUNTER — Inpatient Hospital Stay (HOSPITAL_COMMUNITY): Payer: BLUE CROSS/BLUE SHIELD | Admitting: Certified Registered Nurse Anesthetist

## 2017-05-25 ENCOUNTER — Encounter (HOSPITAL_COMMUNITY): Payer: Self-pay | Admitting: Certified Registered Nurse Anesthetist

## 2017-05-25 ENCOUNTER — Encounter (HOSPITAL_COMMUNITY): Admission: EM | Disposition: E | Payer: Self-pay | Source: Home / Self Care | Attending: Vascular Surgery

## 2017-05-25 DIAGNOSIS — M79A21 Nontraumatic compartment syndrome of right lower extremity: Secondary | ICD-10-CM

## 2017-05-25 HISTORY — PX: FASCIOTOMY CLOSURE: SHX5829

## 2017-05-25 LAB — BASIC METABOLIC PANEL
ANION GAP: 8 (ref 5–15)
BUN: 20 mg/dL (ref 6–20)
CO2: 24 mmol/L (ref 22–32)
Calcium: 8.2 mg/dL — ABNORMAL LOW (ref 8.9–10.3)
Chloride: 108 mmol/L (ref 101–111)
Creatinine, Ser: 1.18 mg/dL — ABNORMAL HIGH (ref 0.44–1.00)
GFR calc Af Amer: 55 mL/min — ABNORMAL LOW (ref >60)
GFR, EST NON AFRICAN AMERICAN: 48 mL/min — AB (ref >60)
Glucose, Bld: 258 mg/dL — ABNORMAL HIGH (ref 65–99)
POTASSIUM: 4.5 mmol/L (ref 3.5–5.1)
Sodium: 140 mmol/L (ref 135–145)

## 2017-05-25 LAB — CBC
HCT: 26.2 % — ABNORMAL LOW (ref 36.0–46.0)
HEMOGLOBIN: 8.4 g/dL — AB (ref 12.0–15.0)
MCH: 30.5 pg (ref 26.0–34.0)
MCHC: 32.1 g/dL (ref 30.0–36.0)
MCV: 95.3 fL (ref 78.0–100.0)
PLATELETS: 136 10*3/uL — AB (ref 150–400)
RBC: 2.75 MIL/uL — AB (ref 3.87–5.11)
RDW: 14.5 % (ref 11.5–15.5)
WBC: 10.1 10*3/uL (ref 4.0–10.5)

## 2017-05-25 LAB — GLUCOSE, CAPILLARY
GLUCOSE-CAPILLARY: 234 mg/dL — AB (ref 65–99)
GLUCOSE-CAPILLARY: 208 mg/dL — AB (ref 65–99)
GLUCOSE-CAPILLARY: 234 mg/dL — AB (ref 65–99)
Glucose-Capillary: 82 mg/dL (ref 65–99)
Glucose-Capillary: 236 mg/dL — ABNORMAL HIGH (ref 65–99)

## 2017-05-25 SURGERY — FASCIOTOMY CLOSURE
Anesthesia: General | Site: Leg Lower | Laterality: Right

## 2017-05-25 MED ORDER — PROPOFOL 10 MG/ML IV BOLUS
INTRAVENOUS | Status: DC | PRN
  Administered 2017-05-25: 50 mg via INTRAVENOUS

## 2017-05-25 MED ORDER — FENTANYL CITRATE (PF) 250 MCG/5ML IJ SOLN
INTRAMUSCULAR | Status: DC | PRN
  Administered 2017-05-25: 25 ug via INTRAVENOUS

## 2017-05-25 MED ORDER — ONDANSETRON HCL 4 MG/2ML IJ SOLN
INTRAMUSCULAR | Status: DC | PRN
  Administered 2017-05-25: 4 mg via INTRAVENOUS

## 2017-05-25 MED ORDER — DEXTROSE 5 % IV SOLN
INTRAVENOUS | Status: DC | PRN
  Administered 2017-05-25: 30 ug/min via INTRAVENOUS

## 2017-05-25 MED ORDER — PROTAMINE SULFATE 10 MG/ML IV SOLN
INTRAVENOUS | Status: AC
  Filled 2017-05-25: qty 25

## 2017-05-25 MED ORDER — ONDANSETRON HCL 4 MG/2ML IJ SOLN
INTRAMUSCULAR | Status: AC
  Filled 2017-05-25: qty 6

## 2017-05-25 MED ORDER — ROCURONIUM BROMIDE 50 MG/5ML IV SOLN
INTRAVENOUS | Status: AC
  Filled 2017-05-25: qty 1

## 2017-05-25 MED ORDER — MIDAZOLAM HCL 2 MG/2ML IJ SOLN
INTRAMUSCULAR | Status: AC
  Filled 2017-05-25: qty 2

## 2017-05-25 MED ORDER — LIDOCAINE HCL (CARDIAC) PF 100 MG/5ML IV SOSY
PREFILLED_SYRINGE | INTRAVENOUS | Status: DC | PRN
  Administered 2017-05-25: 40 mg via INTRAVENOUS

## 2017-05-25 MED ORDER — PHENYLEPHRINE 40 MCG/ML (10ML) SYRINGE FOR IV PUSH (FOR BLOOD PRESSURE SUPPORT)
PREFILLED_SYRINGE | INTRAVENOUS | Status: DC | PRN
  Administered 2017-05-25: 40 ug via INTRAVENOUS
  Administered 2017-05-25 (×2): 80 ug via INTRAVENOUS
  Administered 2017-05-25: 40 ug via INTRAVENOUS

## 2017-05-25 MED ORDER — EPHEDRINE SULFATE-NACL 50-0.9 MG/10ML-% IV SOSY
PREFILLED_SYRINGE | INTRAVENOUS | Status: DC | PRN
  Administered 2017-05-25 (×2): 10 mg via INTRAVENOUS
  Administered 2017-05-25: 5 mg via INTRAVENOUS

## 2017-05-25 MED ORDER — DEXAMETHASONE SODIUM PHOSPHATE 10 MG/ML IJ SOLN
INTRAMUSCULAR | Status: AC
  Filled 2017-05-25: qty 2

## 2017-05-25 MED ORDER — ALBUMIN HUMAN 5 % IV SOLN
INTRAVENOUS | Status: DC | PRN
  Administered 2017-05-25: 12:00:00 via INTRAVENOUS

## 2017-05-25 MED ORDER — LIDOCAINE 2% (20 MG/ML) 5 ML SYRINGE
INTRAMUSCULAR | Status: AC
  Filled 2017-05-25: qty 10

## 2017-05-25 MED ORDER — LACTATED RINGERS IV SOLN
INTRAVENOUS | Status: DC
  Administered 2017-05-25: 10:00:00 via INTRAVENOUS

## 2017-05-25 MED ORDER — DEXAMETHASONE SODIUM PHOSPHATE 10 MG/ML IJ SOLN
INTRAMUSCULAR | Status: DC | PRN
  Administered 2017-05-25: 4 mg via INTRAVENOUS

## 2017-05-25 MED ORDER — 0.9 % SODIUM CHLORIDE (POUR BTL) OPTIME
TOPICAL | Status: DC | PRN
  Administered 2017-05-25: 1000 mL

## 2017-05-25 MED ORDER — PROTAMINE SULFATE 10 MG/ML IV SOLN
INTRAVENOUS | Status: AC
  Filled 2017-05-25: qty 5

## 2017-05-25 MED ORDER — FENTANYL CITRATE (PF) 250 MCG/5ML IJ SOLN
INTRAMUSCULAR | Status: AC
  Filled 2017-05-25: qty 5

## 2017-05-25 MED ORDER — HEPARIN SODIUM (PORCINE) 1000 UNIT/ML IJ SOLN
INTRAMUSCULAR | Status: AC
  Filled 2017-05-25: qty 1

## 2017-05-25 MED ORDER — HYDROMORPHONE HCL 2 MG/ML IJ SOLN: 0.2500 mg | INTRAMUSCULAR | Status: DC | PRN

## 2017-05-25 MED FILL — ATORVASTATIN 80 MG TABLET: 80 | 90 days supply | Qty: 90 | Fill #0

## 2017-05-25 SURGICAL SUPPLY — 24 items
BAG ISL DRAPE 18X18 STRL (DRAPES) ×1
BAG ISOLATION DRAPE 18X18 (DRAPES) IMPLANT
CANISTER SUCT 3000ML PPV (MISCELLANEOUS) ×2 IMPLANT
CANISTER WOUND CARE 500ML ATS (WOUND CARE) ×3 IMPLANT
CONNECTOR Y ATS VAC SYSTEM (MISCELLANEOUS) ×1 IMPLANT
DRAPE ISOLATION BAG 18X18 (DRAPES) ×1
DRSG VAC ATS SM SENSATRAC (GAUZE/BANDAGES/DRESSINGS) ×2 IMPLANT
ELECT REM PT RETURN 9FT ADLT (ELECTROSURGICAL) ×2
ELECTRODE REM PT RTRN 9FT ADLT (ELECTROSURGICAL) ×1 IMPLANT
GEL ULTRASOUND 20GR AQUASONIC (MISCELLANEOUS) ×1 IMPLANT
GLOVE BIO SURGEON STRL SZ7.5 (GLOVE) ×2 IMPLANT
GOWN STRL REUS W/ TWL LRG LVL3 (GOWN DISPOSABLE) ×2 IMPLANT
GOWN STRL REUS W/ TWL XL LVL3 (GOWN DISPOSABLE) ×1 IMPLANT
GOWN STRL REUS W/TWL LRG LVL3 (GOWN DISPOSABLE) ×2
GOWN STRL REUS W/TWL XL LVL3 (GOWN DISPOSABLE) ×2
KIT BASIN OR (CUSTOM PROCEDURE TRAY) ×2 IMPLANT
KIT TURNOVER KIT B (KITS) ×2 IMPLANT
NS IRRIG 1000ML POUR BTL (IV SOLUTION) ×2 IMPLANT
PACK GENERAL/GYN (CUSTOM PROCEDURE TRAY) ×2 IMPLANT
PAD ARMBOARD 7.5X6 YLW CONV (MISCELLANEOUS) ×4 IMPLANT
SUT SILK 2 0 (SUTURE) ×2
SUT SILK 2-0 18XBRD TIE 12 (SUTURE) IMPLANT
TOWEL GREEN STERILE (TOWEL DISPOSABLE) ×2 IMPLANT
WATER STERILE IRR 1000ML POUR (IV SOLUTION) ×1 IMPLANT

## 2017-05-25 NOTE — Transfer of Care (Signed)
Immediate Anesthesia Transfer of Care Note  Patient: Andrea [name]  Procedure(s) Performed: Right Lower Extremity Four Compartment Fasciotomy (Right Leg Lower)  Patient Location: PACU  Anesthesia Type:General  Level of Consciousness: drowsy  Airway & Oxygen Therapy: Patient Spontanous Breathing and Patient connected to face mask oxygen  Post-op Assessment: Report given to RN and Post -op Vital signs reviewed and stable  Post vital signs: Reviewed and stable  Last Vitals:  Vitals Value Taken Time  BP 119/43 06/11/2017 12:03 PM  Temp    Pulse 73 05/27/2017 12:06 PM  Resp 12 05/22/2017 12:06 PM  SpO2 100 % 06/04/2017 12:06 PM  Vitals shown include unvalidated device data.  Last Pain:  Vitals:   05/16/2017 0944  TempSrc: Axillary  PainSc: Asleep      Patients Stated Pain Goal: 4 (06/06/17 1739)  Complications: No apparent anesthesia complications

## 2017-05-25 NOTE — Progress Notes (Signed)
Patient returned from PACU at 1311. VS obtained and rechecked. Afebrile. Lungs sounds clear to bilateral upper lobes and diminished to bilateral lower lobes. Patient lethargic, but will respond to verbal and tactile stimuli with garbled speech. Portable telemetry applied. RLE has 2 wound vacs operating at 125 mmHg negative pressure with moderate sanguineous drainage noted under transparent dressing. Dr. Randie Heinz made aware via PACU nurse. No acute distress noted.

## 2017-05-25 NOTE — Progress Notes (Addendum)
[facility]  Subjective  - Resting will not open eyes or talk.   Objective 123/63 67 99.1 F (37.3 C) (Oral) 20 93%  Intake/Output Summary (Last 24 hours) at 06/09/2017 0729 Last data filed at 2017/06/18 1700 Gross per 24 hour  Intake 1783.7 ml  Output 325 ml  Net 1458.7 ml    Doppler signal right PT, foot warm to touch Right BK incision without hematoma or drainage.   Heart SV rhythm rate 80's Lungs non labored breathing Rest moves to painful tough surrounding the incision   Assessment/Planning: POD # 1   PROCEDURE: Right popliteal exploration and thrombectomy with Gore-Tex patch angioplasty distal femoropopliteal anastomosis, intraoperative arteriogram, exploration and thrombectomy of posterior tibial ankle at the ankle  Patent by pass this morning with PT doppler signal and foot is warm to touch PT eval and treat for mobility ABI will be ordered today HGB low 8.4 will observe for now intraoperative arteriogram slight increase in Cr 1.18, UO 575 since surgery.  Has pure wick external female catheter.       Andrea [name] 05/17/2017 7:29 AM --  Laboratory Lab Results: Recent Labs    Jun 18, 2017 0239 06/05/2017 0330  WBC 11.2* 10.1  HGB 11.0* 8.4*  HCT 34.4* 26.2*  PLT 161 136*   BMET Recent Labs    06/18/2017 0239 05/22/2017 0330  NA 140 140  K 4.1 4.5  CL 107 108  CO2 24 24  GLUCOSE 191* 258*  BUN 14 20  CREATININE 0.75 1.18*  CALCIUM 8.6* 8.2*    COAG Lab Results  Component Value Date   INR 0.87 05/29/2017   INR 1.09 09/10/2016   INR 0.93 07/16/2016   No results found for: PTT     I have independently interviewed and examined patient and agree with PA assessment and plan above.  Will plan to go to OR for fasciotomy this morning as last resort. I have discussed with patient and her sister.  Jonique Kulig C. Randie Heinz, MD [facility] [employer] Office: [phone] Pager: [phone]

## 2017-05-25 NOTE — Anesthesia Procedure Notes (Signed)
Procedure Name: LMA Insertion Date/Time: 06/08/2017 11:15 AM Performed by: Burt Ek, CRNA Pre-anesthesia Checklist: Patient identified, Emergency Drugs available, Suction available and Patient being monitored Patient Re-evaluated:Patient Re-evaluated prior to induction Oxygen Delivery Method: Circle system utilized Preoxygenation: Pre-oxygenation with 100% oxygen Induction Type: IV induction LMA: LMA inserted LMA Size: 4.0 Number of attempts: 1 Placement Confirmation: positive ETCO2 and breath sounds checked- equal and bilateral Tube secured with: Tape Dental Injury: Teeth and Oropharynx as per pre-operative assessment

## 2017-05-25 NOTE — Anesthesia Preprocedure Evaluation (Addendum)
Anesthesia Evaluation  Patient identified by MRN, date of birth, ID band Patient awake    Reviewed: Allergy & Precautions, H&P , NPO status , Patient's Chart, lab work & pertinent test results, reviewed documented beta blocker date and time   History of Anesthesia Complications (+) PONV  Airway Mallampati: III  TM Distance: >3 FB Neck ROM: Full    Dental no notable dental hx. (+) Edentulous Upper, Dental Advisory Given   Pulmonary asthma , former smoker,    Pulmonary exam normal breath sounds clear to auscultation       Cardiovascular hypertension, Pt. on medications and Pt. on home beta blockers + CAD, + Peripheral Vascular Disease and +CHF   Rhythm:Regular Rate:Normal     Neuro/Psych negative neurological ROS  negative psych ROS   GI/Hepatic Neg liver ROS, GERD  ,  Endo/Other  diabetes, Insulin Dependent, Oral Hypoglycemic Agents  Renal/GU negative Renal ROS  negative genitourinary   Musculoskeletal  (+) Arthritis , Osteoarthritis,  Fibromyalgia -  Abdominal   Peds  Hematology negative hematology ROS (+) anemia ,   Anesthesia Other Findings   Reproductive/Obstetrics negative OB ROS                            Anesthesia Physical Anesthesia Plan  ASA: III  Anesthesia Plan: General   Post-op Pain Management:    Induction: Intravenous  PONV Risk Score and Plan: 4 or greater and Ondansetron, Dexamethasone and Scopolamine patch - Pre-op  Airway Management Planned: LMA  Additional Equipment:   Intra-op Plan:   Post-operative Plan: Extubation in OR  Informed Consent: I have reviewed the patients History and Physical, chart, labs and discussed the procedure including the risks, benefits and alternatives for the proposed anesthesia with the patient or authorized representative who has indicated his/her understanding and acceptance.   Dental advisory given  Plan Discussed with:  CRNA  Anesthesia Plan Comments:        Anesthesia Quick Evaluation

## 2017-05-25 NOTE — Progress Notes (Signed)
RT attempted to give pt inhaler at this time and pt was combative and confused. RT alerted RN and RN stated that this was her normal. RT to monitor as needed

## 2017-05-25 NOTE — Progress Notes (Signed)
Wound vac leaking. Dr.Cain at bedside. Y removed and new vac placed on right side. Will continue to monitor

## 2017-05-25 NOTE — Op Note (Signed)
    Patient name: THEORA VANKIRK MRN: [id number] DOB: [birth year] Sex: female  05/18/2017 Pre-operative Diagnosis: Right lower extremity pain status post revascularization Post-operative diagnosis:  Same Surgeon:  Apolinar Junes C. Randie Heinz, MD Procedure Performed: Right leg 4 compartment fasciotomy.     Indications: 67 year old female has undergone right lower extremity revascularization after occluded right femoral to popliteal artery bypass graft.  She now has significant pain and motor dysfunction in the leg.  We are taking her to the operating room for 4 compartment fasciotomy.  Findings: There was some muscle bulging in the  deep posterior compartment as well as the anterior compartment.  All muscle was viable and reactive to cautery.   Procedure:  The patient was identified in the holding area and taken to the operating room where she was placed supine on the operative table and general anesthesia was induced.  She sterilely prepped and draped in the right lower extremity usual fashion and timeout was called.  Antibiotics were previously up-to-date.  We then opened her previous medial incision both overlying the posterior tibial artery distally as well as the popliteal fossa.  We then connected these 2 incisions.  The fascia was incised with cautery and then the deep fascia was opened as well.  We did several 1 connecting vein branch which had to be ligated between ties.  We then turned attention laterally where we made an incision between the tibia and fibula.  We dissected down to the level of the fascia and then raised flaps anteriorly and posteriorly and divided the fascia with cautery in the anterior compartment where there was some muscle bulging as well as then the lateral compartment which had less bulging.  All muscle in all 4 compartments that appeared viable and reactive to cautery.  We then fashioned wound vacs medially and laterally.  Patient was allowed away from anesthesia having tolerated  procedure without immediate complication.  All counts were correct at completion.    EBL 100 cc   Saafir Abdullah C. Randie Heinz, MD [facility] [place] Office: [phone] Pager: [phone]

## 2017-05-25 NOTE — Evaluation (Addendum)
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 Pt presenting with clinically occluded bypass graft on the right. S/p right femoral to popliteal bypass graft and RLE angiogram 05/23/16.    Clinical Impression   Pt received supine in bed and sleeping. Pt reporting she was living alone and performing ADLs and functional mobility with SPC; unsure of accuracy due to decreased cognition with lethargy. Pt currently requiring Total A for ADLs and bed mobility due to significant lethargy. At EOB, pt started smacking her lips and stated "I'm eating Fritos." Pt would benefit from further acute OT to facilitate safe dc. Recommend dc to SNF for further OT to optimize safety, independence with ADLs, and return to PLOF.      Follow Up Recommendations  SNF;Supervision/Assistance - 24 hour    Equipment Recommendations  None recommended by OT    Recommendations for Other Services PT consult     Precautions / Restrictions Precautions Precautions: Fall Required Braces or Orthoses: Knee Immobilizer - Left Restrictions Weight Bearing Restrictions: No      Mobility Bed Mobility Overal bed mobility: Needs Assistance Bed Mobility: Supine to Sit;Sit to Supine     Supine to sit: Total assist;+2 for physical assistance Sit to supine: Total assist;+2 for physical assistance   General bed mobility comments: Pt significantly lethargic and suspect medication. Pt requiring total A to bring BLEs towards EOB, elevate trunk, and use of pad to bring hips towards EOB. Pt unable to maintain sitting posture and required total A. Pt pushing backwards  Transfers                 General transfer comment: Not safe to attempt this session    Balance Overall balance assessment: Needs  assistance Sitting-balance support: Bilateral upper extremity supported;Feet supported Sitting balance-Leahy Scale: Poor Sitting balance - Comments: Requiring total A to maintain upright posture due to lethargy. Pt pushing backward. Pt able to maintain sitting with min guard for 2-3 second three times during session                                   ADL either performed or assessed with clinical judgement   ADL Overall ADL's : Needs assistance/impaired     Grooming: Wash/dry face;Bed level;Total assistance Grooming Details (indicate cue type and reason): Pt requiring Max cues to open eyes and follow commands to wash her face at bed level. Pt reaching out towards wash clothe but reaching to left and right of wash clothe. Once pt grasp wash cloth she would place on her lap and close her eyes.  Upper Body Bathing: Total assistance;Bed level   Lower Body Bathing: Total assistance;Bed level   Upper Body Dressing : Total assistance;Bed level   Lower Body Dressing: Total assistance;Bed level Lower Body Dressing Details (indicate cue type and reason): Pt donning socks at EOB with total A to maintain sitting balance and then total A to don sock. cueing pt to don sock. Pt reaching out towards sock without success to grasp. Once pt grasping sock, she would drop it into her lap.                General ADL Comments: Pt requiring Total A for all ADLs due to lethargy. Pt not following simple  commands. Pt with eye closed throughout session. Unable to answer orientation questions.      Vision   Additional Comments: unsure. Pt unable to answer questions due to lethargy     Perception     Praxis      Pertinent Vitals/Pain Pain Assessment: Faces Faces Pain Scale: Hurts whole lot Pain Location: RLE Pain Descriptors / Indicators: Discomfort;Grimacing;Operative site guarding Pain Intervention(s): Monitored during session;Limited activity within patient's tolerance;Repositioned      Hand Dominance Right   Extremity/Trunk Assessment Upper Extremity Assessment Upper Extremity Assessment: Generalized weakness   Lower Extremity Assessment Lower Extremity Assessment: Defer to PT evaluation;RLE deficits/detail;LLE deficits/detail RLE Deficits / Details: s/p Right popliteal exploration and thrombectomy  LLE Deficits / Details: L foot amputation in 11/19/2016   Cervical / Trunk Assessment Cervical / Trunk Assessment: Other exceptions Cervical / Trunk Exceptions: Increased body habitus   Communication Communication Communication: HOH   Cognition Arousal/Alertness: Lethargic Behavior During Therapy: Flat affect Overall Cognitive Status: Impaired/Different from baseline Area of Impairment: Orientation;Attention;Memory;Following commands;Safety/judgement;Awareness;Problem solving                 Orientation Level: Disoriented to;Person;Time;Situation Current Attention Level: Focused Memory: Decreased short-term memory Following Commands: Follows one step commands inconsistently Safety/Judgement: Decreased awareness of safety;Decreased awareness of deficits Awareness: Intellectual Problem Solving: Slow processing;Difficulty sequencing;Decreased initiation;Requires verbal cues;Requires tactile cues General Comments: When asked about name, pt answered with DOB. While sitting at EOB, pt stating "I am eating fritos." Pt maintaining eye closed throughout session and required Max cues to open for ~5 sec.    General Comments  Pt highly lethargic during session    Exercises     Shoulder Instructions      Home Living Family/patient expects to be discharged to:: Private residence Living Arrangements: Alone Available Help at Discharge: Available PRN/intermittently;Family Type of Home: Apartment                       Home Equipment: Cane - single point(SPC in room)   Additional Comments: Pt unable to provide home set up due to lethargy. Information  above collected from prior admission. Pt able to report she returned to home alone after recent admission to [facility] for rehab after LLE amp.       Prior Functioning/Environment Level of Independence: Independent with assistive device(s)        Comments: Pt unable to provide PLOF due to lethargy. Use SPC for functional mobility.        OT Problem List: Decreased strength;Decreased range of motion;Decreased activity tolerance;Impaired balance (sitting and/or standing);Impaired vision/perception;Decreased coordination;Decreased cognition;Decreased safety awareness;Decreased knowledge of use of DME or AE;Decreased knowledge of precautions;Pain      OT Treatment/Interventions: Self-care/ADL training;Therapeutic exercise;Energy conservation;DME and/or AE instruction;Therapeutic activities;Patient/family education;Balance training    OT Goals(Current goals can be found in the care plan section) Acute Rehab OT Goals Patient Stated Goal: Unstated OT Goal Formulation: Patient unable to participate in goal setting Time For Goal Achievement: 06/08/17 Potential to Achieve Goals: Good  OT Frequency: Min 2X/week   Barriers to D/C: Decreased caregiver support  Lives home alone       Co-evaluation PT/OT/SLP Co-Evaluation/Treatment: Yes Reason for Co-Treatment: Complexity of the patient's impairments (multi-system involvement);To address functional/ADL transfers   OT goals addressed during session: ADL's and self-care      AM-PAC PT "6 Clicks" Daily Activity     Outcome Measure Help from another person eating meals?: Total Help from another person taking care of personal grooming?:  Total Help from another person toileting, which includes using toliet, bedpan, or urinal?: Total Help from another person bathing (including washing, rinsing, drying)?: Total Help from another person to put on and taking off regular upper body clothing?: Total Help from another person to put on and taking off  regular lower body clothing?: Total 6 Click Score: 6   End of Session Nurse Communication: Mobility status  Activity Tolerance: Patient limited by pain;Patient limited by lethargy Patient left: in bed;with call bell/phone within reach;with bed alarm set  OT Visit Diagnosis: Unsteadiness on feet (R26.81);Other abnormalities of gait and mobility (R26.89);Muscle weakness (generalized) (M62.81);Other symptoms and signs involving cognitive function;Pain Pain - Right/Left: Right Pain - part of body: Leg                Time: 0811-0832 OT Time Calculation (min): 21 min Charges:  OT General Charges $OT Visit: 1 Visit OT Evaluation $OT Eval Moderate Complexity: 1 Mod G-Codes:     Chalisa Kobler MSOT, OTR/L Acute Rehab Pager: [phone] Office: [phone]  Theodoro Grist Romy Mcgue 05/16/2017, 9:33 AM

## 2017-05-25 NOTE — Plan of Care (Signed)
  Problem: Clinical Measurements: Goal: Will remain free from infection Outcome: Progressing   Problem: Cardiovascular: Goal: Vascular access site(s) Level 0-1 will be maintained Outcome: Progressing

## 2017-05-25 NOTE — Care Management Note (Signed)
Case Management Note Donn Pierini RN, BSN Unit 4E-Case Manager [phone]  Patient Details  Name: Andrea [name] MRN: [id number] Date of Birth: November 02, 1950  Subjective/Objective:  Pt admitted right foot ischemia s/p TPA on 5/12 then 5/13 thrombectomy with exploration- developed compartment syndrome- returned to OR on 5/14 for 4-compartment fasciotomy-              Action/Plan: PTA pt lived at home alone, per PT/OT evals recommendation for SNF- CSW to consult for possible placement. CM to follow for transition of care needs  Expected Discharge Date:                  Expected Discharge Plan:  Skilled Nursing Facility  In-House Referral:  Clinical Social Work  Discharge planning Services  CM Consult  Post Acute Care Choice:    Choice offered to:     DME Arranged:    DME Agency:     HH Arranged:    HH Agency:     Status of Service:  In process, will continue to follow  If discussed at Long Length of Stay Meetings, dates discussed:    Discharge Disposition:   Additional Comments:  Darrold Span, RN 05/12/2017, 4:10 PM

## 2017-05-25 NOTE — Anesthesia Postprocedure Evaluation (Signed)
Anesthesia Post Note  Patient: Andrea [name]  Procedure(s) Performed: Right Lower Extremity Four Compartment Fasciotomy (Right Leg Lower)     Patient location during evaluation: PACU Anesthesia Type: General Level of consciousness: awake and alert Pain management: pain level controlled Vital Signs Assessment: post-procedure vital signs reviewed and stable Respiratory status: spontaneous breathing, nonlabored ventilation and respiratory function stable Cardiovascular status: blood pressure returned to baseline and stable Postop Assessment: no apparent nausea or vomiting Anesthetic complications: no    Last Vitals:  Vitals:   06/08/2017 1233 05/28/2017 1259  BP: 109/71   Pulse: 94   Resp: 15   Temp:  36.5 C  SpO2: 97%     Last Pain:  Vitals:   05/21/2017 1259  TempSrc:   PainSc: Asleep                 Emlyn Maves,W. EDMOND

## 2017-05-25 NOTE — Evaluation (Signed)
Physical Therapy Evaluation Patient Details Name: Andrea [name] MRN: [id number] DOB: 23-Apr-1950 Today's Date: 06/09/2017   History of Present Illness  Pt is a 67 y.o. 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 Now s/p right femoral to popliteal bypass graft and RLE angiogram on 5/12.    Clinical Impression  Pt presents with an overall decrease in functional mobility secondary to above. Pt very lethargic and poor historian; reports has been mod indep living at home alone since d/c from [facility] with L toe amputations (11/2016). Max, multimodal cues to open eyes with limited participation this session; totalA for mobility. Pt with intermittent confusion and not following commands.  Pt would benefit from continued acute PT services to maximize functional mobility and independence prior to d/c with SNF-level therapies.     Follow Up Recommendations SNF;Supervision/Assistance - 24 hour    Equipment Recommendations  (TBD)    Recommendations for Other Services       Precautions / Restrictions Precautions Precautions: Fall Precaution Comments: L toe amputations Required Braces or Orthoses: Knee Immobilizer - Left Restrictions Weight Bearing Restrictions: No      Mobility  Bed Mobility Overal bed mobility: Needs Assistance Bed Mobility: Supine to Sit;Sit to Supine     Supine to sit: Total assist;+2 for physical assistance Sit to supine: Total assist;+2 for physical assistance   General bed mobility comments: Pt significantly lethargic and suspect medication. Pt requiring total A to bring BLEs towards EOB, elevate trunk, and use of pad to bring hips towards EOB. Pt unable to maintain sitting posture and required total A. Pt pushing backwards  Transfers                 General transfer comment: Not safe to attempt this session  Ambulation/Gait                Stairs            Wheelchair Mobility    Modified Rankin (Stroke Patients Only)       Balance Overall balance assessment: Needs assistance Sitting-balance support: Bilateral upper extremity supported;Feet supported Sitting balance-Leahy Scale: Poor Sitting balance - Comments: Requiring total A to maintain upright posture due to lethargy. Pt pushing backward. Pt able to maintain sitting with min guard for 2-3 second three times during session Postural control: Posterior lean                                   Pertinent Vitals/Pain Pain Assessment: Faces Faces Pain Scale: Hurts whole lot Pain Location: RLE with movement Pain Descriptors / Indicators: Discomfort;Grimacing;Operative site guarding;Moaning Pain Intervention(s): Monitored during session;Limited activity within patient's tolerance;Repositioned    Home Living Family/patient expects to be discharged to:: Private residence Living Arrangements: Alone Available Help at Discharge: Available PRN/intermittently;Family Type of Home: Apartment         Home Equipment: Cane - single point Additional Comments: Pt unable to provide home set up due to lethargy. Information above collected from prior admission. Pt able to report she returned to home alone after recent admission to [facility] for rehab after L toe amp.     Prior Function Level of Independence: Independent with assistive device(s)         Comments: Pt unable to provide PLOF due to lethargy. Use SPC for functional mobility; amb short household distances before  needing to sit and rest     Hand Dominance   Dominant Hand: Right    Extremity/Trunk Assessment   Upper Extremity Assessment Upper Extremity Assessment: Generalized weakness    Lower Extremity Assessment Lower Extremity Assessment: Generalized weakness;RLE deficits/detail;LLE deficits/detail RLE Deficits / Details: s/p Right popliteal exploration and thrombectomy  LLE  Deficits / Details: L foot amputation in 11/19/2016    Cervical / Trunk Assessment Cervical / Trunk Assessment: Other exceptions Cervical / Trunk Exceptions: Increased body habitus  Communication   Communication: HOH  Cognition Arousal/Alertness: Lethargic;Suspect due to medications Behavior During Therapy: Flat affect Overall Cognitive Status: No family/caregiver present to determine baseline cognitive functioning Area of Impairment: Orientation;Attention;Memory;Following commands;Safety/judgement;Awareness;Problem solving                 Orientation Level: Disoriented to;Person;Time;Situation;Place Current Attention Level: Focused Memory: Decreased short-term memory Following Commands: Follows one step commands inconsistently Safety/Judgement: Decreased awareness of safety;Decreased awareness of deficits Awareness: Intellectual Problem Solving: Slow processing;Difficulty sequencing;Decreased initiation;Requires verbal cues;Requires tactile cues General Comments: When asked name, answered with DOB; able to state first name. Able to state at [facility], but then thinking cabinet was her fridge/freezer at home. States she is eating grits while trying to bite wash cloth. Eyes closed majority of session requiring max multimodal cues to open ~5 sec      General Comments General comments (skin integrity, edema, etc.): Pt highly lethargic during session    Exercises     Assessment/Plan    PT Assessment Patient needs continued PT services  PT Problem List Decreased strength;Decreased activity tolerance;Decreased balance;Decreased mobility;Decreased cognition;Decreased knowledge of use of DME;Decreased safety awareness;Pain       PT Treatment Interventions DME instruction;Gait training;Stair training;Functional mobility training;Therapeutic activities;Therapeutic exercise;Balance training;Patient/family education;Cognitive remediation    PT Goals (Current goals can be found in  the Care Plan section)  Acute Rehab PT Goals Patient Stated Goal: Unstated PT Goal Formulation: Patient unable to participate in goal setting Time For Goal Achievement: 06/08/17    Frequency Min 2X/week   Barriers to discharge Decreased caregiver support      Co-evaluation PT/OT/SLP Co-Evaluation/Treatment: Yes Reason for Co-Treatment: Complexity of the patient's impairments (multi-system involvement);To address functional/ADL transfers PT goals addressed during session: Mobility/safety with mobility;Balance OT goals addressed during session: ADL's and self-care       AM-PAC PT "6 Clicks" Daily Activity  Outcome Measure Difficulty turning over in bed (including adjusting bedclothes, sheets and blankets)?: Unable Difficulty moving from lying on back to sitting on the side of the bed? : Unable Difficulty sitting down on and standing up from a chair with arms (e.g., wheelchair, bedside commode, etc,.)?: Unable Help needed moving to and from a bed to chair (including a wheelchair)?: Total Help needed walking in hospital room?: Total Help needed climbing 3-5 steps with a railing? : Total 6 Click Score: 6    End of Session Equipment Utilized During Treatment: Gait belt Activity Tolerance: Patient limited by lethargy Patient left: in bed;with call bell/phone within reach;with bed alarm set Nurse Communication: Mobility status PT Visit Diagnosis: Other abnormalities of gait and mobility (R26.89)    Time: 1610-9604 PT Time Calculation (min) (ACUTE ONLY): 30 min   Charges:   PT Evaluation $PT Eval Moderate Complexity: 1 Mod     PT G Codes:       Ina Homes, PT, DPT Acute Rehab Services  Pager: [phone]  Malachy Chamber 06/07/2017, 10:00 AM

## 2017-05-26 ENCOUNTER — Inpatient Hospital Stay (HOSPITAL_COMMUNITY): Payer: BLUE CROSS/BLUE SHIELD

## 2017-05-26 ENCOUNTER — Encounter (HOSPITAL_COMMUNITY): Payer: Self-pay | Admitting: Vascular Surgery

## 2017-05-26 DIAGNOSIS — E872 Acidosis, unspecified: Secondary | ICD-10-CM

## 2017-05-26 DIAGNOSIS — J9691 Respiratory failure, unspecified with hypoxia: Secondary | ICD-10-CM

## 2017-05-26 DIAGNOSIS — G9340 Encephalopathy, unspecified: Secondary | ICD-10-CM

## 2017-05-26 DIAGNOSIS — A419 Sepsis, unspecified organism: Secondary | ICD-10-CM

## 2017-05-26 DIAGNOSIS — R579 Shock, unspecified: Secondary | ICD-10-CM

## 2017-05-26 DIAGNOSIS — Z419 Encounter for procedure for purposes other than remedying health state, unspecified: Secondary | ICD-10-CM

## 2017-05-26 DIAGNOSIS — J9601 Acute respiratory failure with hypoxia: Secondary | ICD-10-CM

## 2017-05-26 DIAGNOSIS — N179 Acute kidney failure, unspecified: Secondary | ICD-10-CM

## 2017-05-26 DIAGNOSIS — R6521 Severe sepsis with septic shock: Secondary | ICD-10-CM

## 2017-05-26 LAB — GLUCOSE, CAPILLARY
GLUCOSE-CAPILLARY: 234 mg/dL — AB (ref 65–99)
Glucose-Capillary: 194 mg/dL — ABNORMAL HIGH (ref 65–99)
Glucose-Capillary: 140 mg/dL — ABNORMAL HIGH (ref 65–99)

## 2017-05-26 LAB — URINALYSIS, ROUTINE W REFLEX MICROSCOPIC
BILIRUBIN URINE: NEGATIVE
Ketones, ur: 5 mg/dL — AB
Nitrite: NEGATIVE
PH: 5 (ref 5.0–8.0)
Protein, ur: 100 mg/dL — AB
SPECIFIC GRAVITY, URINE: 1.017 (ref 1.005–1.030)

## 2017-05-26 LAB — COMPREHENSIVE METABOLIC PANEL
ALBUMIN: 1.9 g/dL — AB (ref 3.5–5.0)
ALT: 554 U/L — ABNORMAL HIGH (ref 14–54)
AST: 1903 U/L — ABNORMAL HIGH (ref 15–41)
Alkaline Phosphatase: 128 U/L — ABNORMAL HIGH (ref 38–126)
BUN: 33 mg/dL — ABNORMAL HIGH (ref 6–20)
CHLORIDE: 109 mmol/L (ref 101–111)
Calcium: 7.5 mg/dL — ABNORMAL LOW (ref 8.9–10.3)
Creatinine, Ser: 2.63 mg/dL — ABNORMAL HIGH (ref 0.44–1.00)
GFR calc Af Amer: 21 mL/min — ABNORMAL LOW (ref >60)
GFR calc non Af Amer: 18 mL/min — ABNORMAL LOW (ref >60)
GLUCOSE: 223 mg/dL — AB (ref 65–99)
POTASSIUM: 6.5 mmol/L — AB (ref 3.5–5.1)
SODIUM: 145 mmol/L (ref 135–145)
Total Bilirubin: 1.6 mg/dL — ABNORMAL HIGH (ref 0.3–1.2)
Total Protein: 4.4 g/dL — ABNORMAL LOW (ref 6.5–8.1)

## 2017-05-26 LAB — PROCALCITONIN: PROCALCITONIN: 14.42 ng/mL

## 2017-05-26 LAB — CBC
HEMATOCRIT: 23.3 % — AB (ref 36.0–46.0)
Hemoglobin: 6.5 g/dL — CL (ref 12.0–15.0)
MCH: 30 pg (ref 26.0–34.0)
MCHC: 27.9 g/dL — ABNORMAL LOW (ref 30.0–36.0)
MCV: 107.4 fL — ABNORMAL HIGH (ref 78.0–100.0)
PLATELETS: 72 10*3/uL — AB (ref 150–400)
RBC: 2.17 MIL/uL — ABNORMAL LOW (ref 3.87–5.11)
RDW: 13.9 % (ref 11.5–15.5)
WBC: 11 10*3/uL — AB (ref 4.0–10.5)

## 2017-05-26 LAB — BLOOD GAS, ARTERIAL
Acid-base deficit: 21.7 mmol/L — ABNORMAL HIGH (ref 0.0–2.0)
BICARBONATE: 8.5 mmol/L — AB (ref 20.0–28.0)
Drawn by: 418751
FIO2: 100
O2 Content: 15 L/min
O2 Saturation: 63.4 %
PCO2 ART: 45.7 mmHg (ref 32.0–48.0)
PH ART: 6.901 — AB (ref 7.350–7.450)
Patient temperature: 98.6
pO2, Arterial: 56.4 mmHg — ABNORMAL LOW (ref 83.0–108.0)

## 2017-05-26 LAB — POCT I-STAT 3, ART BLOOD GAS (G3+)
Acid-base deficit: 23 mmol/L — ABNORMAL HIGH (ref 0.0–2.0)
BICARBONATE: 7.4 mmol/L — AB (ref 20.0–28.0)
O2 Saturation: 100 %
PCO2 ART: 32.8 mmHg (ref 32.0–48.0)
PH ART: 6.964 — AB (ref 7.350–7.450)
Patient temperature: 98.6
TCO2: 8 mmol/L — AB (ref 22–32)
pO2, Arterial: 413 mmHg — ABNORMAL HIGH (ref 83.0–108.0)

## 2017-05-26 LAB — TROPONIN I: TROPONIN I: 29.66 ng/mL — AB (ref <0.03)

## 2017-05-26 LAB — CK: CK TOTAL: 1562 U/L — AB (ref 38–234)

## 2017-05-26 LAB — LACTIC ACID, PLASMA: LACTIC ACID, VENOUS: 20 mmol/L — AB (ref 0.5–1.9)

## 2017-05-26 MED ORDER — INSULIN ASPART 100 UNIT/ML ~~LOC~~ SOLN: 0.0000 [IU] | SUBCUTANEOUS | Status: DC

## 2017-05-26 MED ORDER — PIPERACILLIN-TAZOBACTAM 3.375 G IVPB
3.3750 g | Freq: Three times a day (TID) | INTRAVENOUS | Status: DC
  Filled 2017-05-26 (×2): qty 50

## 2017-05-26 MED ORDER — DOCUSATE SODIUM 50 MG/5ML PO LIQD: 100.0000 mg | Freq: Every day | ORAL | Status: DC

## 2017-05-26 MED ORDER — SODIUM CHLORIDE 0.9 % IV SOLN: Freq: Once | INTRAVENOUS | Status: DC

## 2017-05-26 MED ORDER — MIDAZOLAM HCL 2 MG/2ML IJ SOLN: 1.0000 mg | INTRAMUSCULAR | Status: DC | PRN

## 2017-05-26 MED ORDER — VANCOMYCIN HCL 10 G IV SOLR
1250.0000 mg | Freq: Once | INTRAVENOUS | Status: DC
  Administered 2017-05-26: 1250 mg via INTRAVENOUS
  Filled 2017-05-26: qty 1250

## 2017-05-26 MED ORDER — VANCOMYCIN HCL 500 MG IV SOLR: 500.0000 mg | Freq: Two times a day (BID) | INTRAVENOUS | Status: DC

## 2017-05-26 MED ORDER — SODIUM BICARBONATE 8.4 % IV SOLN: INTRAVENOUS | Status: DC

## 2017-05-26 MED ORDER — NOREPINEPHRINE BITARTRATE 1 MG/ML IV SOLN
0.0000 ug/min | INTRAVENOUS | Status: DC
  Administered 2017-05-26: 20 ug/min via INTRAVENOUS
  Filled 2017-05-26: qty 4

## 2017-05-26 MED ORDER — FENTANYL CITRATE (PF) 100 MCG/2ML IJ SOLN
25.0000 ug | INTRAMUSCULAR | Status: DC | PRN
  Filled 2017-05-26: qty 2

## 2017-05-26 MED ORDER — FENTANYL CITRATE (PF) 100 MCG/2ML IJ SOLN
INTRAMUSCULAR | Status: AC
  Filled 2017-05-26: qty 2

## 2017-05-26 MED ORDER — NALOXONE HCL 0.4 MG/ML IJ SOLN
INTRAMUSCULAR | Status: AC
  Administered 2017-05-26: 0.4 mg
  Filled 2017-05-26: qty 1

## 2017-05-26 MED ORDER — SODIUM CHLORIDE 0.9 % IV BOLUS
1000.0000 mL | Freq: Once | INTRAVENOUS | Status: AC
  Administered 2017-05-26: 1000 mL via INTRAVENOUS

## 2017-05-26 MED ORDER — MORPHINE BOLUS VIA INFUSION
5.0000 mg | INTRAVENOUS | Status: DC | PRN
  Administered 2017-05-26: 10 mg via INTRAVENOUS
  Filled 2017-05-26: qty 20

## 2017-05-26 MED ORDER — ATORVASTATIN CALCIUM 80 MG PO TABS
80.0000 mg | ORAL_TABLET | Freq: Every day | ORAL | Status: DC
  Filled 2017-05-26: qty 1

## 2017-05-26 MED ORDER — CHLORHEXIDINE GLUCONATE 0.12% ORAL RINSE (MEDLINE KIT): 15.0000 mL | Freq: Two times a day (BID) | OROMUCOSAL | Status: DC

## 2017-05-26 MED ORDER — ARFORMOTEROL TARTRATE 15 MCG/2ML IN NEBU
15.0000 ug | INHALATION_SOLUTION | Freq: Two times a day (BID) | RESPIRATORY_TRACT | Status: DC
  Filled 2017-05-26 (×2): qty 2

## 2017-05-26 MED ORDER — VASOPRESSIN 20 UNIT/ML IV SOLN
0.0300 [IU]/min | INTRAVENOUS | Status: DC
  Administered 2017-05-26: 0.03 [IU]/min via INTRAVENOUS
  Filled 2017-05-26: qty 2

## 2017-05-26 MED ORDER — CLOPIDOGREL BISULFATE 75 MG PO TABS: 75.0000 mg | ORAL_TABLET | Freq: Every day | ORAL | Status: DC

## 2017-05-26 MED ORDER — VASOPRESSIN 20 UNIT/ML IV SOLN: 0.0300 [IU]/min | INTRAVENOUS | Status: DC

## 2017-05-26 MED ORDER — STERILE WATER FOR INJECTION IV SOLN
INTRAVENOUS | Status: DC
  Administered 2017-05-26: 07:00:00 via INTRAVENOUS
  Filled 2017-05-26: qty 850

## 2017-05-26 MED ORDER — BUDESONIDE 0.5 MG/2ML IN SUSP
0.5000 mg | Freq: Two times a day (BID) | RESPIRATORY_TRACT | Status: DC
  Administered 2017-05-26: 0.5 mg via RESPIRATORY_TRACT
  Filled 2017-05-26: qty 2

## 2017-05-26 MED ORDER — MORPHINE 100MG IN NS 100ML (1MG/ML) PREMIX INFUSION
10.0000 mg/h | INTRAVENOUS | Status: DC
  Administered 2017-05-26: 10 mg/h via INTRAVENOUS
  Filled 2017-05-26 (×2): qty 100

## 2017-05-26 MED ORDER — INSULIN ASPART 100 UNIT/ML ~~LOC~~ SOLN: 2.0000 [IU] | SUBCUTANEOUS | Status: DC

## 2017-05-26 MED ORDER — MIDAZOLAM HCL 2 MG/2ML IJ SOLN
INTRAMUSCULAR | Status: AC
  Filled 2017-05-26: qty 2

## 2017-05-26 MED ORDER — ASPIRIN 81 MG PO CHEW: 81.0000 mg | CHEWABLE_TABLET | Freq: Every day | ORAL | Status: DC

## 2017-05-26 MED ORDER — PANTOPRAZOLE SODIUM 40 MG PO PACK
40.0000 mg | PACK | Freq: Every day | ORAL | Status: DC
  Filled 2017-05-26: qty 20

## 2017-05-26 MED ORDER — SODIUM BICARBONATE 8.4 % IV SOLN
100.0000 meq | Freq: Once | INTRAVENOUS | Status: AC
  Administered 2017-05-26: 100 meq via INTRAVENOUS
  Filled 2017-05-26: qty 100

## 2017-05-26 MED ORDER — VASOPRESSIN 20 UNIT/ML IV SOLN: 0.4000 [IU]/min | INTRAVENOUS | Status: DC

## 2017-05-26 MED ORDER — ORAL CARE MOUTH RINSE: 15.0000 mL | OROMUCOSAL | Status: DC

## 2017-05-26 MED ORDER — PIPERACILLIN-TAZOBACTAM 3.375 G IVPB 30 MIN
3.3750 g | Freq: Four times a day (QID) | INTRAVENOUS | Status: DC
  Administered 2017-05-26: 3.375 g via INTRAVENOUS
  Filled 2017-05-26 (×2): qty 50

## 2017-05-26 MED ORDER — SODIUM BICARBONATE 8.4 % IV SOLN
INTRAVENOUS | Status: AC
  Administered 2017-05-26: 12:00:00
  Filled 2017-05-26: qty 50

## 2017-05-26 MED FILL — Heparin Sod (Porcine)-NaCl IV Soln 1000 Unit/500ML-0.9%: INTRAVENOUS | Qty: 1000 | Status: AC

## 2017-05-28 ENCOUNTER — Telehealth: Payer: Self-pay

## 2017-05-28 LAB — CULTURE, RESPIRATORY W GRAM STAIN

## 2017-05-28 LAB — CULTURE, RESPIRATORY: CULTURE: NORMAL

## 2017-05-28 LAB — URINE CULTURE: Culture: >=100000 — AB

## 2017-05-28 NOTE — Telephone Encounter (Signed)
On 05/28/17 I received a d/c from [employer] (original). The d/c is for burial. The patient is a patient of Doctor Molli Knock.  The d/c will be taken to [facility] ([id number]) for signature.  On 06/01/17 I received the d/c back from Doctor Craige Cotta who signed the d/c for Doctor Molli Knock.  I got the d/c ready and called the funeral home to let them know the d/c is ready for pickup.

## 2017-06-09 ENCOUNTER — Ambulatory Visit: Payer: BLUE CROSS/BLUE SHIELD | Admitting: Family Medicine

## 2017-06-12 NOTE — Progress Notes (Signed)
PULMONARY / CRITICAL CARE MEDICINE   Name: Andrea [name] MRN: [id number] DOB: May 15, 1950    ADMISSION DATE:  06/08/2017 CONSULTATION DATE:  06/08/2017  REFERRING MD:  Randie Heinz  CHIEF COMPLAINT:  AMS  HISTORY OF PRESENT ILLNESS:  Pt is encephelopathic; therefore, this HPI is obtained from chart review. Andrea [name] is a 67 y.o. female with PMH as outlined below including right femoral to below knee popliteal artery bypass graft with PTFE and right common femoral endarterectomy.  Course complicated by groin breakdown; however, healed with debridement alone.  Had similar procedure on left with infection of graft that was subsequently excised.  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  She was admitted 5/10 with RLE pain x 1 week.  Admitted by vascular for clinically occluded right fem to pop bypass graft.  Admitted with plans for lysis of graft on right  Hospital course outline: 5/10 > admit. 5/12 > RLE angiogram, tpa infusion catheter placed and R fem to pop bypass graft. 5/13 > RLE angiogram, stent of R external iliac artery, closure of left common femoral artery access site. 5/13 > R popliteal exploration and thrombectomy 5/14 > R leg 4 compartment fasciotomy  Early AM 5/15, pt found by nurse unresponsive with hypotension, hypoxia.  ABG demonstrated metabolic acidosis.  She was subsequently emergently transferred to ICU for probable intubation.  SUBJECTIVE:  Sedated, intubated and paralyzed  VITAL SIGNS: BP (!) 110/45   Pulse 87   Temp 99.3 F (37.4 C) (Axillary)   Resp (!) 25   Ht  (1.575 m)   Wt 144 lb 6.4 oz (65.5 kg)   SpO2 (!) 70%   BMI 26.41 kg/m    HEMODYNAMICS:    VENTILATOR SETTINGS: Vent Mode: PRVC FiO2 (%):  [100 %] 100 % Set Rate:  [25 bmp] 25 bmp Vt Set:  [400 mL] 400 mL PEEP:  [5 cmH20] 5 cmH20 Plateau Pressure:  [14 cmH20] 14 cmH20  INTAKE / OUTPUT: I/O last 3 completed shifts: In: 3068.2 [I.V.:2718.2; IV Piggyback:350] Out: 1275  [Urine:1225; Blood:50]   PHYSICAL EXAMINATION: General: Acute on chronically ill appearing female, sedated and paralyzed Neuro: Sedated, paralyzed so unable to assess HEENT: [place]/AT, no EOM-I and no pupillary reactivity, DMM Cardiovascular: Sinus, tachy, Nl S1/S2 and -M/R/G Lungs: CTA bilaterally Abdomen: Soft, NT, ND and hypoactive bowel sounds Musculoskeletal: RLE wound vac in place.  Left transmetatarsal amputation.  Right toes ecchymotic.  No edema.  Skin: Cool, extremities slightly mottled.  LABS:  BMET Recent Labs  Lab 05/22/2017 2254 06/11/2017 0239 05/24/2017 0330  NA 134* 140 140  K 4.5 4.1 4.5  CL 102 107 108  CO2 BUN 25* 14 20  CREATININE 0.87 0.75 1.18*  GLUCOSE 474* 191* 258*   Electrolytes Recent Labs  Lab 05/27/2017 2254 05/17/2017 0239 05/29/2017 0330  CALCIUM 9.0 8.6* 8.2*   CBC Recent Labs  Lab 05/13/2017 2124 05/22/2017 0239 05/12/2017 0330  WBC 12.0* 11.2* 10.1  HGB 11.6* 11.0* 8.4*  HCT 35.6* 34.4* 26.2*  PLT 199 161 136*   Coag's Recent Labs  Lab 06/10/2017 2254  INR 0.87   Sepsis Markers No results for input(s): LATICACIDVEN, PROCALCITON, O2SATVEN in the last 168 hours.  ABG Recent Labs  Lab 06/05/2017 0620  PHART 6.901*  PCO2ART 45.7  PO2ART 56.4*   Liver Enzymes Recent Labs  Lab 05/20/2017 2254  AST 18  ALT 15  ALKPHOS 75  BILITOT 0.5  ALBUMIN 3.3*   Cardiac Enzymes  No results for input(s): TROPONINI, PROBNP in the last 168 hours.  Glucose Recent Labs  Lab 06/08/2017 0609 05/29/2017 1208 05/18/2017 1337 06/01/2017 1625 05/28/2017 2127 05/13/2017 0546  GLUCAP 234* 82 208* 236* 234* 234*   Imaging CXR that I reviewed myself, no infiltrate, TLC ok, ETT too low  STUDIES:  ABI 5/14 >   CULTURES: Blood cultures 5/15>>> Urine 5/15>>> Sputum 5/15>>>  ANTIBIOTICS: Ancef 5/12 > 5/15 Vanc 5/15>>> Zosyn 5/15>>>  SIGNIFICANT EVENTS: 5/10 > admit. 5/12 > RLE angiogram, tpa infusion catheter placed and R fem to pop bypass  graft. 5/13 > RLE angiogram, stent of R external iliac artery, closure of left common femoral artery access site. 5/13 > R popliteal exploration and thrombectomy 5/14 > R leg 4 compartment fasciotomy  LINES/TUBES: ETT 5/15>>> R IJ TLC 5/15>>> OGT 5/15>>>  DISCUSSION: 67 y.o. female vasculopath, admitted 5/10 with RLE pain felt to be due to occluded graft.  Had tPA infusion followed by stent to R external iliac artery followed by thrombectomy 5/13 and fasciotomy 5/14.  Early AM 5/15, PCCM called emergently for probably intubation.  ASSESSMENT / PLAN:  PULMONARY A: Acute hypoxic respiratory failure - will need intubation. P:   - Full vent support - ABG now - Adjust vent for ABG - Titrate O2 for sat of 88-92% - Bicarb drip  CARDIOVASCULAR A:  RLE fem - pop graft occlusion - s/p tPA, thrombectomy, fasciotomy (Dr. Randie Heinz). Shock - ? Cardiogenic. Hx HTN, HLD, CAD. P:  - Vascular following. - Post op care per vascular. - Tele monitoring - 2D echo - CVP check - Hold ASA, plavix until CT is done - D/c antihypertensives. - Levophed for BP support - Add vaso  RENAL A:   AKI. P:   - HCO3 @ 100. - BMP in AM. - Replace electrolytes as indicated  - Check CVP  GASTROINTESTINAL A:   Nutrition. Hx GERD. P:   - NPO. - Abdomen and pelvic CT for ischemia  HEMATOLOGIC A:   Anemia - chronic. Thrombocytopenia. VTE Prophylaxis. P:  - Transfuse for Hgb < 7. - Monitor platelet counts. - Heparin on hold for head CT given AMS. - CBC in AM.  INFECTIOUS A:   Surgical prophylaxis. P:   - D/C ancef - Vanc/zyson - Pan culture - Procal protocol  ENDOCRINE A:   Hx DM. P:   - SSI. - CBGs  NEUROLOGIC A:   Acute encephalopathy. Hx fibromyalgia. P:   - PRN versed/fentanyl - Head CT. - D/c cymbalta, gabapentin, dilaudid, percocet.  Family updated: None available.  Interdisciplinary Family Meeting v Palliative Care Meeting:  Due by: 06/01/17.  The patient is  critically ill with multiple organ systems failure and requires high complexity decision making for assessment and support, frequent evaluation and titration of therapies, application of advanced monitoring technologies and extensive interpretation of multiple databases.   Critical Care Time devoted to patient care services described in this note is  75  Minutes. This time reflects time of care of this signee Dr [name]. This critical care time does not reflect procedure time, or teaching time or supervisory time of PA/NP/Med student/Med Resident etc but could involve care discussion time.  Alyson Reedy, M.D. [facility] Pulmonary/Critical Care Medicine. Pager: [phone]. After hours pager: [phone].  05/17/2017, 7:47 AM

## 2017-06-12 NOTE — Progress Notes (Signed)
Pt transported to CT3 and back to 2M02 on vent with RT, RN, and transport. No complications. Pt tol well.

## 2017-06-12 NOTE — Procedures (Signed)
Central Venous Catheter Insertion Procedure Note Andrea [name] [id number] September 14, 1950  Procedure: Insertion of Central Venous Catheter Indications: Renal failure  Procedure Details Consent: Risks of procedure as well as the alternatives and risks of each were explained to the (patient/caregiver).  Consent for procedure obtained. Time Out: Verified patient identification, verified procedure, site/side was marked, verified correct patient position, special equipment/implants available, medications/allergies/relevent history reviewed, required imaging and test results available.  Performed  Maximum sterile technique was used including antiseptics, cap, gloves, gown, hand hygiene, mask and sheet. Skin prep: Chlorhexidine; local anesthetic administered A antimicrobial bonded/coated triple lumen catheter was placed in the right internal jugular vein using the Seldinger technique.  Evaluation Blood flow good Complications: No apparent complications Patient did tolerate procedure well. Chest X-ray ordered to verify placement.  CXR: pending.  U/S used in placement  Stylianos Stradling 05/13/2017, 12:09 PM

## 2017-06-12 NOTE — Progress Notes (Signed)
   06/10/2017 1400  Clinical Encounter Type  Visited With Patient;Patient and family together  Visit Type Initial  Referral From Nurse  Spiritual Encounters  Spiritual Needs Prayer;Emotional;Grief support  Stress Factors  Patient Stress Factors Exhausted  Family Stress Factors Exhausted    pt passed. Chaplain provided emotional support through reflective empathic listening, compassionate presence and prayer both to nurse and family.  Killian Ress a Water quality scientist, [employer]

## 2017-06-12 NOTE — Discharge Summary (Signed)
[facility] Discharge Summary   Patient ID:  Andrea [name] MRN: [id number] DOB/AGE: [birth year] 67 y.o.  Admit date: 05/24/2017 Discharge date: 06-12-2017 Date of Surgery: 05/25/2017 Surgeon: Surgeon(s): Maeola Harman, MD  Admission Diagnosis: Rt ankle pain   Discharge Diagnoses:  Rt ankle pain   Secondary Diagnoses: Past Medical History:  Diagnosis Date  . Arthritis   . Asthma   . Coronary artery disease   . Diabetes mellitus without complication (HCC)   . Dyspnea   . Fibromyalgia   . GERD (gastroesophageal reflux disease)   . High cholesterol   . Hypertension   . 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 PONV (postoperative nausea and vomiting)   . PVD (peripheral vascular disease) (HCC)   . Urinary frequency   . Urinary incontinence     Procedure(s): Right Lower Extremity Four Compartment Fasciotomy  Discharged Condition: Deceased 1338 06-12-2017  HPI: This is a 67 y.o. female with history of a right femoral to below-knee popliteal artery bypass graft with PTFE and right common femoral endarterectomy as well, with subsequent right groin incisional breakdown.  This healed with debridement alone.   On the left side she will underwent a similar procedure had an infection of the graft that was subsequently excised.  She then had viabahn stenting when all of her wounds healed from her femoral to her popliteal arteries.  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  She presents today with right lower extremity pain that began exactly 1 week ago but has been worse for the past 6 hours she can still feel her foot and move it..  She has been walking.  Did not say anything to anybody until her sister arrived from the grocery store.  On exam in the office the right fem to below knee pop bypass is clinically occluded.   She was taken to the [facility] lab on Jun 09, 2017:  Findings: The aorta has a very narrow  bifurcation.  The external iliac artery on the right appears to have some disease and this can be addressed in the future.  The bypass graft is occluded.  Distally there is very minimal flow past the bypass.  There appears to be either acute thrombus or existing hyperplastic disease at the tibioperoneal trunk.  A 50 cm treatment length catheter was placed from the TP trunk back to the takeoff of the graft and the common femoral artery.  The runoff to the foot appears to be the posterior tibial and peroneal arteries.  Placement of 50 cm treatment length TPA infusion catheter and right femoral to popliteal bypass graft.  TPA was stopped overnight for decrease in fibrinogen.     Return to the [facility] lab 05/22/2017 Stent of right external iliac artery with 7 x 40mm elluvia  Findings: The femoropopliteal bypass appears patent.  Distally there appears to be at least anastomotic stenosis.  The runoff is via the posterior tibial and peroneal arteries but this terminates in the mid calf.  We placed the catheter into the posterior tibial artery appears that she has thrombus distally in her foot.  We then evaluated her external iliac artery lesion that we knew was present from procedure yesterday.  This had approximately 70% stenosis and was stented to 0% residual stenosis.  Patient will need to go to the operating room for right lower extremity thrombi embolectomy and possible graft revision.  Hospital Course:  Andrea [name] is a 67 y.o. female is S/P  Procedure(s):  Dr. Arbie Cookey  Performed 05/23/2017 PROCEDURE: Right popliteal exploration and thrombectomy with Gore-Tex patch angioplasty distal femoropopliteal anastomosis, intraoperative arteriogram, exploration and thrombectomy of posterior tibial ankle at the ankle  POD#1 Patent by pass this morning with PT doppler signal and foot is warm to touch PT eval and treat for mobility ABI will be ordered today HGB low 8.4 will observe for  now intraoperative arteriogram slight increase in Cr 1.18, UO 575 since surgery.  Has pure wick external female catheter.    06/09/2017 12:03 pm Indications: 66 year old female has undergone right lower extremity revascularization after occluded right femoral to popliteal artery bypass graft.  She now has significant pain and motor dysfunction in the leg.  We are taking her to the operating room for 4 compartment fasciotomy.  Right Lower Extremity Four Compartment Fasciotomy 05-27-17 5:56 am  Nurse making rounds and patient found to be diaphoretic. Extremities blue. Unable to get oxygen. Blood sugar 234. RRT called. Vascular on call in surgery Dr. Edilia Bo. 6:10 am Narcan given slow to respond Transferred to ICU Critical care consult called 6:39 am Brought to ICU and given bicarb and IVF's, with some improvement in mental status. At that time patient lifting head off bed and moving all extremities but only intermittently obeying commands. Shortly thereafter though patient became much less responsive and hypotensive and required emergent intubation and emergent central line placement. Started bicarb infusion and gave 1 L IVF bolus. Ordered new labs. Once stabilized will need Head CT to rule out head bleed given that she is encephalopathic and was recently on TPA.    7:30 am Patient in respiratory failure was Intubated and  Insertion of Central Venous Catheter Imaging CXR that I reviewed myself, no infiltrate, TLC ok, ETT too low   8:26 am Procedure: Insertion of Arterial Catheter    11:59 am    Indications: Blood pressure monitoring and Frequent blood sampling Patient came back from CT, head CT is negative for a bleed and abdominal CT prelim is negative for ischemic signs.  Documented events: Sister who is POA arrived, informed of current condition and how dire her sister situation has become.  I believe that patient's condition is extremely critical and risk of death is very high inspite of  max support.  After reviewing the whole case with the sister and brother in law decision was made to proceed with LCB with no CPR and cardioversion but dialysis is ok.  RN Wendi Maya Turner: Discussed end of life care with family and they agreed to transition to comfort care and to not extubate until death. 12:30 pm 2017-05-27.  Dr. [name]: 12:40 pm 2017/05/27  Family requesting meeting, patient troponin and lactic acid are very elevated, patient has no reasonable chance at recovery.  Had an extensive conversation with family again.  After discussion decision was made to proceed with comfort care and DNR status.  Place morphine orders and d/c pressors.  Extubate only when family is ready.  End of life 1:38 pm 05-27-2017      Treatment Team:  Maeola Harman, MD  Significant Diagnostic Studies: CBC Lab Results  Component Value Date   WBC 11.0 (H) 27-May-2017   HGB 6.5 (LL) 2017-05-27   HCT 23.3 (L) 05-27-17   MCV 107.4 (H) 27-May-2017   PLT 72 (L) 05/27/2017    BMET    Component Value Date/Time   NA 145 May 27, 2017 1031  NA 140 04/14/2016 1431   K 6.5 (HH) 06/20/17 1031   CL 109 20-Jun-2017 1031   CO2 <7 (L) 20-Jun-2017 1031   GLUCOSE 223 (H) June 20, 2017 1031   BUN 33 (H) 06-20-2017 1031   BUN 19 04/14/2016 1431   CREATININE 2.63 (H) 06/20/2017 1031   CALCIUM 7.5 (L) 2017/06/20 1031   GFRNONAA 18 (L) 20-Jun-2017 1031   GFRAA 21 (L) Jun 20, 2017 1031   COAG Lab Results  Component Value Date   INR 0.87 06/01/2017   INR 1.09 09/10/2016   INR 0.93 10-Aug-2016     Disposition:  Deceased   Allergies as of 20-Jun-2017      Reactions   Naproxen Itching   Tramadol Hcl Itching      Medication List    ASK your doctor about these medications   acetaminophen 325 MG tablet Commonly known as:  TYLENOL Take 2 tablets (650 mg total) by mouth every 6 (six) hours as needed for mild pain.   albuterol 108 (90 Base) MCG/ACT inhaler Commonly known as:  PROVENTIL  HFA;VENTOLIN HFA Inhale 2 puffs into the lungs every 6 (six) hours as needed for wheezing or shortness of breath.   amoxicillin 500 MG capsule Commonly known as:  AMOXIL Take 1 capsule (500 mg total) by mouth 3 (three) times daily.   aspirin EC 81 MG tablet Take 1 tablet (81 mg total) by mouth daily.   atorvastatin 80 MG tablet Commonly known as:  LIPITOR TAKE 1 TABLET (80 MG TOTAL) BY MOUTH DAILY.   clopidogrel 75 MG tablet Commonly known as:  PLAVIX Take 1 tablet (75 mg total) by mouth daily with breakfast.   diclofenac sodium 1 % Gel Commonly known as:  VOLTAREN Apply 4 g topically 4 (four) times daily.   docusate sodium 100 MG capsule Commonly known as:  COLACE Take 1 capsule (100 mg total) by mouth daily.   DULoxetine 60 MG capsule Commonly known as:  CYMBALTA Take 1 capsule (60 mg total) by mouth daily.   Fluticasone-Salmeterol 100-50 MCG/DOSE Aepb Commonly known as:  ADVAIR Inhale 1 puff into the lungs 2 (two) times daily.   furosemide 40 MG tablet Commonly known as:  LASIX Take 1 tablet (40 mg total) by mouth daily.   gabapentin 300 MG capsule Commonly known as:  NEURONTIN Take 2 capsules (600 mg total) by mouth 2 (two) times daily.   hydrocortisone cream 0.5 % Apply 1 application topically 2 (two) times daily.   Insulin Glargine 100 UNIT/ML Solostar Pen Commonly known as:  LANTUS SOLOSTAR Inject 30 Units into the skin at bedtime.   Insulin Pen Needle 31G X 5 MM Misc Commonly known as:  B-D UF III MINI PEN NEEDLES Use as directed   lisinopril 5 MG tablet Commonly known as:  PRINIVIL,ZESTRIL Take 1 tablet (5 mg total) by mouth daily.   metFORMIN 500 MG tablet Commonly known as:  GLUCOPHAGE Take 1 tablet (500 mg total) by mouth 2 (two) times daily.   metoprolol tartrate 25 MG tablet Commonly known as:  LOPRESSOR Take 0.5 tablets (12.5 mg total) by mouth 2 (two) times daily.   oxybutynin 5 MG tablet Commonly known as:  DITROPAN Take 1 tablet (5  mg total) by mouth 2 (two) times daily.   pantoprazole 20 MG tablet Commonly known as:  PROTONIX Take 1 tablet (20 mg total) by mouth daily.   sulfamethoxazole-trimethoprim 800-160 MG tablet Commonly known as:  BACTRIM DS,SEPTRA DS Take 1 tablet by mouth 2 (two) times daily.  Signed: Mosetta Pigeon 05/31/2017, 10:53 AM

## 2017-06-12 NOTE — Progress Notes (Signed)
Family requesting meeting, patient troponin and lactic acid are very elevated, patient has no reasonable chance at recovery.  Had an extensive conversation with family again.  After discussion decision was made to proceed with comfort care and DNR status.  Place morphine orders and d/c pressors.  Extubate only when family is ready.  The patient is critically ill with multiple organ systems failure and requires high complexity decision making for assessment and support, frequent evaluation and titration of therapies, application of advanced monitoring technologies and extensive interpretation of multiple databases.   Critical Care Time devoted to patient care services described in this note is  35  Minutes. This time reflects time of care of this signee Dr [name]. This critical care time does not reflect procedure time, or teaching time or supervisory time of PA/NP/Med student/Med Resident etc but could involve care discussion time.  Alyson Reedy, M.D. [facility] Pulmonary/Critical Care Medicine. Pager: [phone]. After hours pager: [phone].

## 2017-06-12 NOTE — Significant Event (Addendum)
Rapid Response Event Note  Overview: Unresponsive - Hypoxic - Hypotensive  Initial Focused Assessment: I received a call staff RN (not the patient's primary RN) to come assess the patient since the patient was unresponsive.  When I arrived at 553, patient was completely unresponsive, skin was mottled, blue, unable to obtain a oxygen saturation, patient was placed on NRB 15L 100% prior to my arrival. Blood sugar was checked, it was 234.  Patient was breathing spontaneously, shallow/irregular breaths, pulses were not unattainable in the right radial/ulna site, left radial pulses were very thready, RNs were not able to obtain doppler signals in RLE. SBP in the 80s, MAPs in the 50s, HR in the 90s, + heart sounds.  Interventions: -- STAT ABG - pH 6.9, PaCO2 45, PaO2 57, A/B 21.7,Bicarb 8.5, O2 sat 63.4 ( on NRB 15L). -- Narcan 0.4mg  IV x 1, patient did awaken some, unable to follow commands and LOC deteriorated upon arrival to ICU. -- NS bolus 250cc x 1 prior to transfer to ICU  Plan of Care (if not transferred): -- I called Dr. Edilia Bo in the OR and with his permission, I called PCCM.  PCCM team came to the bedside. -- Patient was transferred to 2M02 -- Assisted in ICU with management of patient. Patient was intubated at 0725.  Event Summary:   at    Call Time 0551 Arrival Time 0553 End Time 0735  Sya Nestler, Tobias Alexander

## 2017-06-12 NOTE — Plan of Care (Signed)
Discussed end of life care with family and they agreed to transition to comfort care and to not extubate until death.

## 2017-06-12 NOTE — Progress Notes (Signed)
Patient came back from CT, head CT is negative for a bleed and abdominal CT prelim is negative for ischemic signs.  Sister who is POA arrived, informed of current condition and how dire her sister situation has become.  I believe that patient's condition is extremely critical and risk of death is very high inspite of max support.  After reviewing the whole case with the sister and brother in law decision was made to proceed with LCB with no CPR and cardioversion but dialysis is ok.  Spoke with nephrology MD, ok with short term CRRT only to address acidosis.   Will place HD catheter and start CRRT.  Will place LCB order in the chart.  The patient is critically ill with multiple organ systems failure and requires high complexity decision making for assessment and support, frequent evaluation and titration of therapies, application of advanced monitoring technologies and extensive interpretation of multiple databases.   Critical Care Time devoted to patient care services described in this note is  35  Minutes. This time reflects time of care of this signee Dr [name]. This critical care time does not reflect procedure time, or teaching time or supervisory time of PA/NP/Med student/Med Resident etc but could involve care discussion time.  Alyson Reedy, M.D. [facility] Pulmonary/Critical Care Medicine. Pager: [phone]. After hours pager: [phone].

## 2017-06-12 NOTE — Progress Notes (Signed)
Narcan given. Still slow to respond.

## 2017-06-12 NOTE — Procedures (Signed)
Arterial Catheter Insertion Procedure Note Andrea [name] [id number] [birth year]  Procedure: Insertion of Arterial Catheter  Indications: Blood pressure monitoring and Frequent blood sampling  Procedure Details Consent: Unable to obtain consent because of emergent medical necessity. Time Out: Verified patient identification, verified procedure, site/side was marked, verified correct patient position, special equipment/implants available, medications/allergies/relevent history reviewed, required imaging and test results available.  Performed  Maximum sterile technique was used including antiseptics, cap, gloves, gown, hand hygiene, mask and sheet. Skin prep: Chlorhexidine; local anesthetic administered 20 gauge catheter was inserted into right femoral artery using the Seldinger technique. ULTRASOUND GUIDANCE USED: YES Evaluation Blood flow good; BP tracing good. Complications: No apparent complications.  Used U/S as patient is a vasculopath and finding vessels was very difficult to palpate.  Unable to print picture from U/S machine.  Andrea [name] 06-14-2017

## 2017-06-12 NOTE — Procedures (Signed)
Endotracheal Intubation Procedure Note Indication for endotracheal intubation: impending respiratory failure Sedation: etomidate Paralytic: rocuronium Equipment: Macintosh 4 laryngoscope blade and 7.1mm cuffed endotracheal tube; secured 23cm at the teeth, 24cm at the lip.  Cricoid Pressure: yes Number of attempts: 1 ETT location confirmed by by auscultation, by CXR and ETCO2 monitor.

## 2017-06-12 NOTE — Consult Note (Addendum)
PULMONARY / CRITICAL CARE MEDICINE   Name: Andrea [name] MRN: [id number] DOB: [birth year]    ADMISSION DATE:  06/03/2017 CONSULTATION DATE:  06/03/2017  REFERRING MD:  Randie Heinz  CHIEF COMPLAINT:  AMS  HISTORY OF PRESENT ILLNESS:  Pt is encephelopathic; therefore, this HPI is obtained from chart review. Andrea [name] is a 67 y.o. female with PMH as outlined below including right femoral to below knee popliteal artery bypass graft with PTFE and right common femoral endarterectomy.  Course complicated by groin breakdown; however, healed with debridement alone.  Had similar procedure on left with infection of graft that was subsequently excised.  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  She was admitted 5/10 with RLE pain x 1 week.  Admitted by vascular for clinically occluded right fem to pop bypass graft.  Admitted with plans for lysis of graft on right  Hospital course outline: 5/10 > admit. 5/12 > RLE angiogram, tpa infusion catheter placed and R fem to pop bypass graft. 5/13 > RLE angiogram, stent of R external iliac artery, closure of left common femoral artery access site. 5/13 > R popliteal exploration and thrombectomy 5/14 > R leg 4 compartment fasciotomy  Early AM 5/15, pt found by nurse unresponsive with hypotension, hypoxia.  ABG demonstrated metabolic acidosis.  She was subsequently emergently transferred to ICU for probable intubation.   PAST MEDICAL HISTORY :  She  has a past medical history of Arthritis, Asthma, Coronary artery disease, Diabetes mellitus without complication (HCC), Dyspnea, Fibromyalgia, GERD (gastroesophageal reflux disease), High cholesterol, Hypertension, Infection, PONV (postoperative nausea and vomiting), PVD (peripheral vascular disease) (HCC), Urinary frequency, and Urinary incontinence.  PAST SURGICAL HISTORY: She  has a past surgical history that includes Knee surgery; Coronary artery bypass graft; ABDOMINAL AORTOGRAM W/LOWER EXTREMITY  (N/A, 03/25/2016); LEFT HEART CATH AND CORS/GRAFTS ANGIOGRAPHY (N/A, 04/15/2016); CORONARY STENT INTERVENTION (N/A, 04/15/2016); Multiple tooth extractions; Femoral-popliteal Bypass Graft (Right, 05/21/2016); Endarterectomy femoral (Right, 05/21/2016); Groin debridement (Right, 06/14/2016); Application if wound vac (Right, 06/14/2016); Femoral bypass (Left, 07/20/2016); Femoral-popliteal Bypass Graft (Left, 07/20/2016); Endarterectomy femoral (Left, 07/20/2016); Removal of graft (Left, 09/10/2016); Patch angioplasty (Left, 09/10/2016); I&D extremity (N/A, 09/12/2016); Aortogram (N/A, 09/16/2016); Muscle flap closure (Left, 09/18/2016); ABDOMINAL AORTOGRAM W/LOWER EXTREMITY (N/A, 10/12/2016); PERIPHERAL VASCULAR INTERVENTION (Left, 10/12/2016); Amputation toe (Left, 10/13/2016); Abdominal hysterectomy; Colonoscopy; Transmetatarsal amputation (Left, 11/19/2016); Aortogram (Right, 05/20/2017); Lower Extremity Angiography (N/A, 06/23/2017); PERIPHERAL VASCULAR INTERVENTION (06/23/2017); Thrombectomy femoral artery (Right, 06-23-17); Patch angioplasty (Right, 06/23/2017); Intraoperative arteriogram (Right, 06/23/17); Femoral artery debridement (Right, 06/23/2017); and Fasciotomy closure (Right, 05/25/2017).  Allergies  Allergen Reactions  . Naproxen Itching  . Tramadol Hcl Itching    No current facility-administered medications on file prior to encounter.    Current Outpatient Medications on File Prior to Encounter  Medication Sig  . acetaminophen (TYLENOL) 325 MG tablet Take 2 tablets (650 mg total) by mouth every 6 (six) hours as needed for mild pain.  Marland Kitchen albuterol (PROVENTIL HFA;VENTOLIN HFA) 108 (90 Base) MCG/ACT inhaler Inhale 2 puffs into the lungs every 6 (six) hours as needed for wheezing or shortness of breath.  Marland Kitchen aspirin EC 81 MG tablet Take 1 tablet (81 mg total) by mouth daily.  Marland Kitchen atorvastatin (LIPITOR) 80 MG tablet TAKE 1 TABLET (80 MG TOTAL) BY MOUTH DAILY.  Marland Kitchen clopidogrel (PLAVIX) 75 MG tablet Take 1 tablet (75 mg total)  by mouth daily with breakfast.  . docusate sodium (COLACE) 100 MG capsule Take 1 capsule (100 mg total) by mouth daily.  . DULoxetine (  CYMBALTA) 60 MG capsule Take 1 capsule (60 mg total) by mouth daily.  . Fluticasone-Salmeterol (ADVAIR) 100-50 MCG/DOSE AEPB Inhale 1 puff into the lungs 2 (two) times daily.  . furosemide (LASIX) 40 MG tablet Take 1 tablet (40 mg total) by mouth daily.  Marland Kitchen gabapentin (NEURONTIN) 300 MG capsule Take 2 capsules (600 mg total) by mouth 2 (two) times daily.  . Insulin Glargine (LANTUS SOLOSTAR) 100 UNIT/ML Solostar Pen Inject 30 Units into the skin at bedtime.  Marland Kitchen lisinopril (PRINIVIL,ZESTRIL) 5 MG tablet Take 1 tablet (5 mg total) by mouth daily.  . metFORMIN (GLUCOPHAGE) 500 MG tablet Take 1 tablet (500 mg total) by mouth 2 (two) times daily.  . metoprolol tartrate (LOPRESSOR) 25 MG tablet Take 0.5 tablets (12.5 mg total) by mouth 2 (two) times daily.  Marland Kitchen oxybutynin (DITROPAN) 5 MG tablet Take 1 tablet (5 mg total) by mouth 2 (two) times daily.  . pantoprazole (PROTONIX) 20 MG tablet Take 1 tablet (20 mg total) by mouth daily.  Marland Kitchen amoxicillin (AMOXIL) 500 MG capsule Take 1 capsule (500 mg total) by mouth 3 (three) times daily. (Patient not taking: Reported on 05/22/2017)  . diclofenac sodium (VOLTAREN) 1 % GEL Apply 4 g topically 4 (four) times daily. (Patient not taking: Reported on 05/22/2017)  . hydrocortisone cream 0.5 % Apply 1 application topically 2 (two) times daily. (Patient not taking: Reported on 05/22/2017)  . Insulin Pen Needle (B-D UF III MINI PEN NEEDLES) 31G X 5 MM MISC Use as directed  . sulfamethoxazole-trimethoprim (BACTRIM DS,SEPTRA DS) 800-160 MG tablet Take 1 tablet by mouth 2 (two) times daily. (Patient not taking: Reported on 03/10/2017)    FAMILY HISTORY:  Her indicated that her mother is deceased. She reported the following about her father: unknown history.   SOCIAL HISTORY: She  reports that she quit smoking about 13 months ago. Her smoking  use included cigarettes. She smoked 0.25 packs per day. She has never used smokeless tobacco. She reports that she does not drink alcohol or use drugs.  REVIEW OF SYSTEMS:  Unable to obtain as pt is encephalopathic.  SUBJECTIVE:  Obtunded.  Has NRB on.  BP soft with SBP in low 90s.  VITAL SIGNS: BP (!) 97/50 (BP Location: Right Arm)   Pulse (!) 106   Temp 99.3 F (37.4 C) (Axillary)   Resp 18   Ht  (1.575 m)   Wt 65.5 kg (144 lb 6.4 oz)   SpO2 94%   BMI 26.41 kg/m   HEMODYNAMICS:    VENTILATOR SETTINGS:    INTAKE / OUTPUT: I/O last 3 completed shifts: In: 4851.9 [P.O.:20; I.V.:4481.9; IV Piggyback:350] Out: 1025 [Urine:825; Blood:200]   PHYSICAL EXAMINATION: General: Chronically ill appearing female, critically ill. Neuro: Obtunded.  MAE to pain. HEENT: [place]/AT. EOMI, sclerae anicteric. Cardiovascular: RRR, no M/R/G.  RLE radial pulse absent. Lungs: Respirations even and unlabored.  CTA bilaterally, No W/R/R. Abdomen: BS x 4, soft, NT/ND.  Musculoskeletal: RLE wound vac in place.  Left transmetatarsal amputation.  Right toes ecchymotic.  No edema.  Skin: Cool, extremities slightly mottled.  LABS:  BMET Recent Labs  Lab 05/22/2017 2254 06/06/2017 0239 05/30/2017 0330  NA 134* 140 140  K 4.5 4.1 4.5  CL 102 107 108  CO2 BUN 25* 14 20  CREATININE 0.87 0.75 1.18*  GLUCOSE 474* 191* 258*    Electrolytes Recent Labs  Lab 05/22/2017 2254 06/03/2017 0239 05/25/17 0330  CALCIUM 9.0 8.6* 8.2*  CBC Recent Labs  Lab 06/07/2017 2124 05/16/2017 0239 05/16/2017 0330  WBC 12.0* 11.2* 10.1  HGB 11.6* 11.0* 8.4*  HCT 35.6* 34.4* 26.2*  PLT 199 161 136*    Coag's Recent Labs  Lab 05/12/2017 2254  INR 0.87    Sepsis Markers No results for input(s): LATICACIDVEN, PROCALCITON, O2SATVEN in the last 168 hours.  ABG Recent Labs  Lab May 27, 2017 0620  PHART 6.901*  PCO2ART 45.7  PO2ART 56.4*    Liver Enzymes Recent Labs  Lab 05/16/2017 2254  AST 18   ALT 15  ALKPHOS 75  BILITOT 0.5  ALBUMIN 3.3*    Cardiac Enzymes No results for input(s): TROPONINI, PROBNP in the last 168 hours.  Glucose Recent Labs  Lab 05/27/2017 0609 06/02/2017 1208 06/01/2017 1337 05/19/2017 1625 05/12/2017 2127 05-27-2017 0546  GLUCAP 234* 82 208* 236* 234* 234*    Imaging No results found.   STUDIES:  ABI 5/14 >   CULTURES:  ANTIBIOTICS: Ancef 5/12 >   SIGNIFICANT EVENTS: 5/10 > admit. 5/12 > RLE angiogram, tpa infusion catheter placed and R fem to pop bypass graft. 5/13 > RLE angiogram, stent of R external iliac artery, closure of left common femoral artery access site. 5/13 > R popliteal exploration and thrombectomy 5/14 > R leg 4 compartment fasciotomy  LINES/TUBES: Probable ETT 5/15 >   DISCUSSION: 67 y.o. female vasculopath, admitted 5/10 with RLE pain felt to be due to occluded graft.  Had tPA infusion followed by stent to R external iliac artery followed by thrombectomy 5/13 and fasciotomy 5/14.  Early AM 5/15, PCCM called emergently for probably intubation.  ASSESSMENT / PLAN:  PULMONARY A: Acute hypoxic respiratory failure - will need intubation. P:   Transfer to ICU for intubation.  CARDIOVASCULAR A:  RLE fem - pop graft occlusion - s/p tPA, thrombectomy, fasciotomy (Dr. Randie Heinz). Shock - ? Cardiogenic. Hx HTN, HLD, CAD. P:  Vascular following. Post op care per vascular. Assess lactate, troponin, CK. Assess echo. Probable CVL and pressors. Continue ASA, plavix. D/c antihypertensives.  RENAL A:   AKI. P:   HCO3 @ 100. BMP in AM.  GASTROINTESTINAL A:   Nutrition. Hx GERD. P:   NPO.  HEMATOLOGIC A:   Anemia - chronic. Thrombocytopenia. VTE Prophylaxis. P:  Transfuse for Hgb < 7. Monitor platelet counts. Heparin. CBC in AM.  INFECTIOUS A:   Surgical prophylaxis. P:   Abx as above (Ancef).   ENDOCRINE A:   Hx DM. P:   SSI.  NEUROLOGIC A:   Acute encephalopathy. Hx fibromyalgia. P:   Will  need sedation post intubation. Probable head CT. D/c cymbalta, gabapentin, dilaudid, percocet.  Family updated: None available.  Interdisciplinary Family Meeting v Palliative Care Meeting:  Due by: 06/01/17.  CC time: 35 min.   Rutherford Guys, [place] - [facility] Pager: [phone]  or [phone] 05/27/2017, 6:39 AM

## 2017-06-12 NOTE — Progress Notes (Addendum)
Pharmacy Antibiotic Note  Andrea [name] is a 67 y.o. female admitted on 06/01/2017 for ischemic R-ankle s/p hx R-femoral to below knee popliteal artery bypass graft and R-common femoral endarterectomy in 2018. R-popliteal thrombectomy. On 5/14, she required R-leg compartment fasciotomy. Early this AM, she became unresponsive and hypotesnive with metabolic acidosis and hypoxia presumed with sepsis.  Pharmacy has been consulted for Vancomycin and Zosyn dosing. Patient now intubated on the ventilator.   SCr is up this AM at 1.18. WBC is currently within normal limits. Tm 99.3. Patient is hypotensive and ST.   Plan: Vancomycin  IV x1 then  IV every 12 hours.  Zosyn 3.375g IV every 8 hours - 4hr infusion.  Monitor renal function, culture results, and clinical status.  Repeat labs are pending - will adjust based on those values.   Height:  (157.5 cm) Weight: 144 lb 6.4 oz (65.5 kg) IBW/kg (Calculated) : 50.1  Temp (24hrs), Avg:98.6 F (37 C), Min:97.7 F (36.5 C), Max:99.6 F (37.6 C)  Recent Labs  Lab 05/27/2017 2254  06/08/2017 0955 05/22/2017 1434 06/01/2017 2124 05/25/2017 0239 04-Jun-2017 0330  WBC  --    < > 8.7 11.4* 12.0* 11.2* 10.1  CREATININE 0.87  --   --   --   --  0.75 1.18*   < > = values in this interval not displayed.    Estimated Creatinine Clearance: 42.8 mL/min (A) (by C-G formula based on SCr of 1.18 mg/dL (H)).    Allergies  Allergen Reactions  . Naproxen Itching  . Tramadol Hcl Itching    Antimicrobials this admission: Ancef 5/11 >> 5/15 Vancomycin 5/15 >> Zosyn 5/15 >>  Dose adjustments this admission:   Microbiology results: 5/15 Blood Cx >> 5/15 Urine Cx >> 5/15 Sputum Cx >> 5/15 MRSA PCR negative  Thank you for allowing pharmacy to be a part of this patient's care.  Link Snuffer, PharmD, BCPS, BCCCP Clinical Pharmacist Clinical phone 05/19/2017 until 3:30PM [phone] After hours, please call #[phone] 05/22/2017 8:08 AM

## 2017-06-12 NOTE — Progress Notes (Signed)
Nurse making rounds and patient found to be diaphoretic. Extremities blue. Unable to get oxygen. Blood sugar 234. RRT called. Vascular on call in surgery Dr. Edilia Bo.

## 2017-06-12 NOTE — Progress Notes (Signed)
Patient transferred to ICU 81M. Sister updated.

## 2017-06-12 NOTE — Procedures (Signed)
Central Venous Catheter Insertion Procedure Note SHRUTI ARREY [id number] Jun 10, 1950  Procedure: Insertion of Central Venous Catheter Indications: Assessment of intravascular volume, Drug and/or fluid administration and Frequent blood sampling  Procedure Details Consent: Unable to obtain consent because of emergent medical necessity. Time Out: Verified patient identification, verified procedure, site/side was marked, verified correct patient position, special equipment/implants available, medications/allergies/relevent history reviewed, required imaging and test results available.  Performed  Maximum sterile technique was used including antiseptics, cap, gloves, gown, hand hygiene, mask and sheet. Skin prep: Chlorhexidine; local anesthetic administered A antimicrobial bonded/coated triple lumen catheter was placed in the left internal jugular vein using the Seldinger technique.  Evaluation Blood flow good Complications: No apparent complications Patient did tolerate procedure well. Chest X-ray ordered to verify placement.  CXR: pending.  Procedure performed under direct ultrasound guidance for real time vessel cannulation.      Rutherford Guys, [place] - [facility] [place] Pager: [phone]  or [phone] 2017-06-09, 7:34 AM

## 2017-06-12 NOTE — Progress Notes (Signed)
Nursing Note: Patient started on morphine infusion at 1300 and all other infusions stopped per orders.  Family at bedside.  Patient absent audible heart tones, respirations, pupillary reaction, and palpable pulse noted at 1338.  Dr. [name] notified of death.  CDS called and ruled out per sepsis.  All lines and tubes removed per protocol and of 1:1 Morphine wasted with Lorie Phenix RN at bedside.  Patient prepare for family viewing.  Comfort care provided to family by this RN.

## 2017-06-12 DEATH — deceased

## 2017-06-18 ENCOUNTER — Ambulatory Visit: Payer: BLUE CROSS/BLUE SHIELD | Admitting: Vascular Surgery

## 2017-06-18 ENCOUNTER — Encounter (HOSPITAL_COMMUNITY): Payer: BLUE CROSS/BLUE SHIELD

## 2017-06-18 ENCOUNTER — Other Ambulatory Visit (HOSPITAL_COMMUNITY): Payer: BLUE CROSS/BLUE SHIELD

## 2017-07-16 ENCOUNTER — Ambulatory Visit: Payer: BLUE CROSS/BLUE SHIELD | Admitting: Family Medicine

## 2017-07-19 ENCOUNTER — Ambulatory Visit: Payer: BLUE CROSS/BLUE SHIELD | Admitting: Family Medicine

## 2017-08-12 ENCOUNTER — Other Ambulatory Visit: Payer: BLUE CROSS/BLUE SHIELD

## 2017-08-12 ENCOUNTER — Ambulatory Visit: Payer: BLUE CROSS/BLUE SHIELD | Admitting: Cardiovascular Disease

## 2017-09-20 ENCOUNTER — Ambulatory Visit: Payer: BLUE CROSS/BLUE SHIELD | Admitting: Cardiovascular Disease

## 2017-09-20 ENCOUNTER — Other Ambulatory Visit: Payer: BLUE CROSS/BLUE SHIELD

## 2017-12-06 IMAGING — NM NM MISC PROCEDURE
3 series · 18 of 18 positions shown · non-contrast
Comparison: none

[Series 1: wbr_s-proj_st stress_(id)_sa · 6.5mm · 6.51mm/px · 6 of 64 frames shown (1 of 2)]
[frame 6/64]
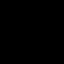
[frame 16/64]
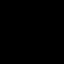
[frame 27/64]
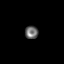
[frame 38/64]
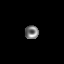
[frame 48/64]
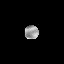
[frame 59/64]
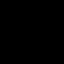

[Series 1: wbr_r-proj_st rest_(id)_sa · 6.5mm · 6.51mm/px · 6 of 64 frames shown]
[frame 6/64]
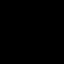
[frame 16/64]
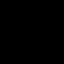
[frame 27/64]
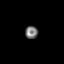
[frame 38/64]
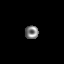
[frame 48/64]
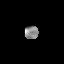
[frame 59/64]
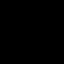

[Series 1: wbr_s-proj_st stress_(id)_sa · 6.5mm · 6.51mm/px · 6 of 512 frames shown (2 of 2)]
[frame 43/512]
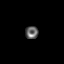
[frame 128/512]
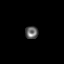
[frame 214/512]
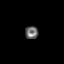
[frame 299/512]
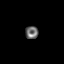
[frame 384/512]
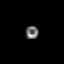
[frame 470/512]
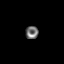

[18 of 18 positions shown; findings below may reference images not displayed]

Canned report from images found in remote index.

Refer to host system for actual result text.
